# Patient Record
Sex: Male | Born: 1955 | Race: Black or African American | Hispanic: No
Health system: Midwestern US, Academic
[De-identification: ages and names within clinical notes are randomized; demographics above are authoritative.]

## PROBLEM LIST (undated history)

## (undated) ENCOUNTER — Ambulatory Visit

## (undated) DIAGNOSIS — I10 Essential (primary) hypertension: Secondary | ICD-10-CM

## (undated) DIAGNOSIS — J449 Chronic obstructive pulmonary disease, unspecified: Secondary | ICD-10-CM

## (undated) DIAGNOSIS — R221 Localized swelling, mass and lump, neck: Secondary | ICD-10-CM

## (undated) DIAGNOSIS — R1011 Right upper quadrant pain: Secondary | ICD-10-CM

## (undated) DIAGNOSIS — I639 Cerebral infarction, unspecified: Principal | ICD-10-CM

## (undated) DIAGNOSIS — R109 Unspecified abdominal pain: Secondary | ICD-10-CM

## (undated) DIAGNOSIS — R0602 Shortness of breath: Secondary | ICD-10-CM

## (undated) DIAGNOSIS — R918 Other nonspecific abnormal finding of lung field: Secondary | ICD-10-CM

## (undated) HISTORY — PX: HERNIA REPAIR: SHX51

## (undated) HISTORY — PX: HEMORRHOID SURGERY: SHX153

## (undated) HISTORY — PX: APPENDECTOMY: SHX54

## (undated) HISTORY — PX: EYE SURGERY: SHX253

## (undated) HISTORY — PX: DG 3RD DIGIT LEFT HAND: HXRAD1645

## (undated) LAB — GLUCOSE POC NURSING
POC Glucose: 102
POC Glucose: 134
POC Glucose: 99

---

## 1998-09-11 ENCOUNTER — Other Ambulatory Visit (HOSPITAL_COMMUNITY): Admission: RE | Admit: 1998-09-11 | Discharge: 1998-10-06 | Payer: Self-pay | Admitting: Psychiatry

## 2000-08-21 ENCOUNTER — Ambulatory Visit (HOSPITAL_COMMUNITY): Admission: RE | Admit: 2000-08-21 | Discharge: 2000-08-21 | Payer: Self-pay | Admitting: *Deleted

## 2000-08-21 ENCOUNTER — Encounter: Payer: Self-pay | Admitting: *Deleted

## 2001-10-18 ENCOUNTER — Encounter: Payer: Self-pay | Admitting: Emergency Medicine

## 2001-10-18 ENCOUNTER — Emergency Department (HOSPITAL_COMMUNITY): Admission: EM | Admit: 2001-10-18 | Discharge: 2001-10-18 | Payer: Self-pay | Admitting: Emergency Medicine

## 2003-01-26 ENCOUNTER — Ambulatory Visit (HOSPITAL_COMMUNITY): Admission: RE | Admit: 2003-01-26 | Discharge: 2003-01-26 | Payer: Self-pay | Admitting: Family Medicine

## 2003-02-16 ENCOUNTER — Ambulatory Visit (HOSPITAL_COMMUNITY): Admission: RE | Admit: 2003-02-16 | Discharge: 2003-02-16 | Payer: Self-pay | Admitting: Family Medicine

## 2003-10-11 ENCOUNTER — Ambulatory Visit (HOSPITAL_COMMUNITY): Admission: RE | Admit: 2003-10-11 | Discharge: 2003-10-11 | Payer: Self-pay | Admitting: Family Medicine

## 2005-12-27 ENCOUNTER — Inpatient Hospital Stay
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
## 2006-05-30 ENCOUNTER — Ambulatory Visit (HOSPITAL_COMMUNITY): Admission: RE | Admit: 2006-05-30 | Discharge: 2006-05-30 | Payer: Self-pay | Admitting: General Surgery

## 2006-05-30 ENCOUNTER — Encounter (INDEPENDENT_AMBULATORY_CARE_PROVIDER_SITE_OTHER): Payer: Self-pay | Admitting: Specialist
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
## 2007-06-30 ENCOUNTER — Ambulatory Visit (HOSPITAL_COMMUNITY): Admission: RE | Admit: 2007-06-30 | Discharge: 2007-06-30 | Payer: Self-pay | Admitting: Family Medicine
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
## 2007-10-31 ENCOUNTER — Emergency Department (HOSPITAL_COMMUNITY): Admission: EM | Admit: 2007-10-31 | Discharge: 2007-10-31 | Payer: Self-pay | Admitting: Emergency Medicine
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
## 2007-11-19 ENCOUNTER — Ambulatory Visit (HOSPITAL_COMMUNITY): Admission: RE | Admit: 2007-11-19 | Discharge: 2007-11-19 | Payer: Self-pay | Admitting: Family Medicine
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
## 2008-08-29 ENCOUNTER — Emergency Department (HOSPITAL_COMMUNITY): Admission: EM | Admit: 2008-08-29 | Discharge: 2008-08-29 | Payer: Self-pay | Admitting: Emergency Medicine

## 2009-04-29 IMAGING — CR DG CHEST 2V
2 series · 2 of 2 positions shown · non-contrast
Comparison: Chest x-ray of 10/31/2007

CLINICAL DATA: Short of breath, cough for a month

CHEST - 2 VIEW

[view not recorded (1 of 2)]
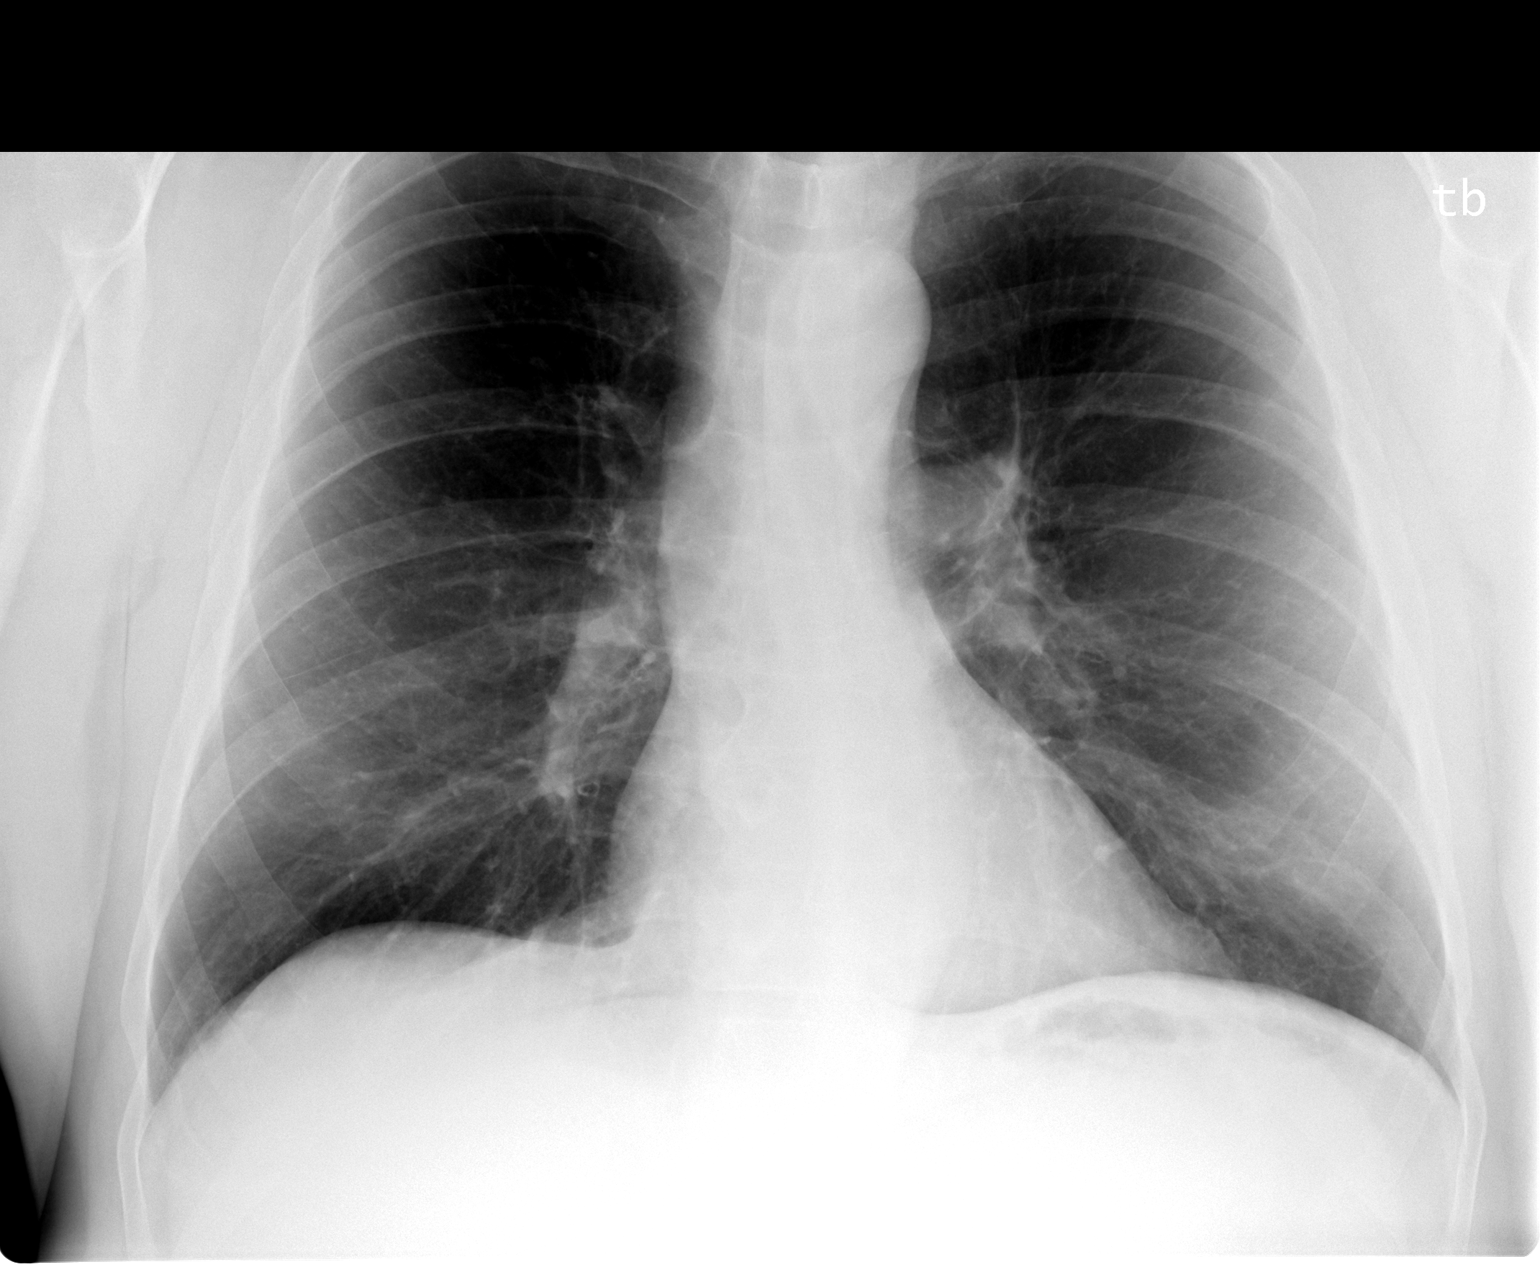

[view not recorded (2 of 2)]
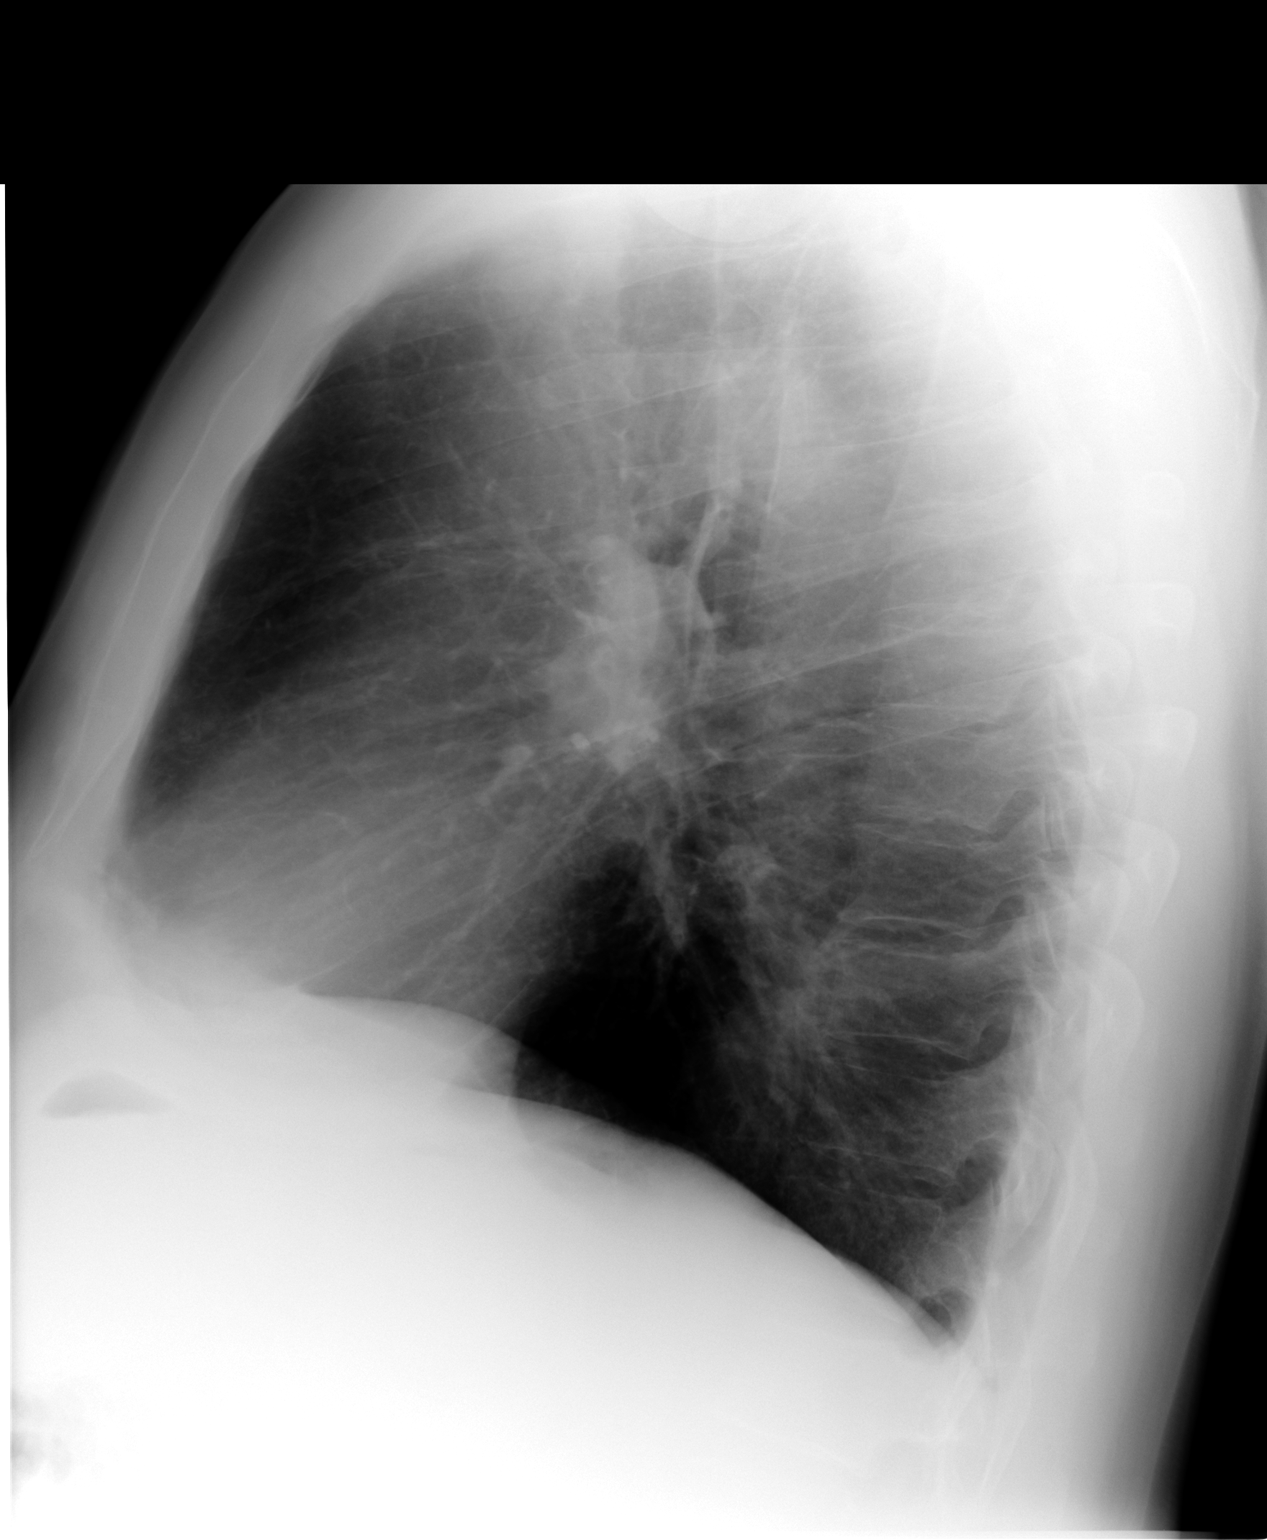

[2 of 2 positions shown; findings below may reference images not displayed]

FINDINGS: The lungs are clear but hyperaerated consistent with
COPD.  Peribronchial thickening is noted consistent with
bronchitis.  The heart is within normal limits in size.  No bony
abnormality is seen
IMPRESSION: COPD and probable chronic bronchitis.  No active lung disease.

## 2009-08-03 NOTE — Unmapped (Signed)
Signed by   LinkLogic on 08/04/2009 at 12:22:07  Patient: [name]  Note: All result statuses are Final unless otherwise noted.    Tests: (1)  (MR)    Order Note:                                         THE [facility]     PATIENT NAME:   [name], [name]                    MR #:  [id number]  DATE OF BIRTH:  25-Oct-1955                        ACCOUNT #:  [id number]  ED PHYSICIAN:   Dorris Fetch, M.D.            ROOM #:  PRIMARY:        Selected Referral Pt              NURSING UNIT:  ED  REFERRING:      Selected Referral Pt              FC:  S  DICTATED BY:    Dale Durham. Rubye Oaks, M.D.              ADMIT DATE:  08/03/2009  VISIT DATE:     08/03/2009                        DISCHARGE DATE:                               EMERGENCY DEPARTMENT NOTE     *-*-*     CHIEF COMPLAINT:  I want a referral.     HISTORY OF PRESENT ILLNESS:  This patient is a 54 year old male with chronic  neck and back pain who comes in also wanting a medication refill.  He states  he has 8/10 pain in his neck and back that is constant, worsened by standing,  relieved by lying down, nonradiating.  It is an achy pain.  It is no  different than his prior pain.  He denies any new trauma.  Denies any saddle  anesthesia, numbness, weakness, tingling in his lower extremities, or any  urinary or bowel incontinence.  He denies any upper extremity complaints as  well.  The patient also reports he feels he has an umbilical hernia that is  tender to palpation, but reducible per his report.     PAST MEDICAL HISTORY:     1.  Chronic neck and back pain.  2.  Diabetes.  3.  COPD.     ALLERGIES:  Please see nursing notes.     MEDICATIONS:  Please see nursing notes.     SOCIAL HISTORY:  The patient denies alcohol, tobacco or drug use.     REVIEW OF SYSTEMS:  Please refer to HPI for pertinent review of systems.  All  other systems are reviewed and are negative including no fevers, chills,  chest pain, shortness of breath.     PHYSICAL EXAMINATION:        VITAL SIGNS:  Blood pressure 143/99, pulse 83, respirations 16, temperature  98.5, SaO2 98% on room air.  GENERAL:  The patient is resting comfortably in no obvious  distress.  HEENT:  Atraumatic, normocephalic.  Pupils equal, round, reactive to light  and accommodation.  Extraocular movements are intact.  NECK:  Supple, full range of motion, no JVD or cervical lymphadenopathy.  CHEST:  Clear to auscultation bilaterally, no wheezing, rales or rhonchi.  CARDIOVASCULAR:  Regular rate and rhythm, normal S1, S2, no murmurs, rubs or  gallops on exam.  ABDOMEN:  Soft, it is nontender throughout.  He does have an umbilical hernia  that is very easily reducible that is mildly tender to palpation, but shows  no signs of any incarceration or strangulation.  Remainder of the abdominal  exam was within normal limits with no signs of organomegaly.  GENITOURINARY:  Deferred per patient request and not related to a primary  complaint.  MUSCULOSKELETAL:  The patient has no C-spine, T-spine or L-spine midline  tenderness.  He does have paraspinal neck tenderness and paraspinal low back  tenderness that is chronic.  He has a negative straight leg raise test  bilaterally.  NEUROLOGIC:  The patient is awake, alert and oriented x4 with a nonfocal  neurologic exam.  Gait was assessed and was normal with no signs of any  ataxia.  SKIN:  No obvious petechiae, rash or purpura.     EMERGENCY DEPARTMENT COURSE/MEDICAL DECISION MAKING:  The patient is a  54 year old male who comes in with chronic neck and back pain as well as a  possible umbilical hernia who was seen by myself and my attending, Dr.  Oneal Grout, who agrees with the assessment and plan.  Upon appropriate history  and physical in minor care room 16, this patient was given 50 mg of tramadol  by mouth.  At this time, with regards to the patient's neck and back pain, it  is chronic in nature.  It is unchanged.  He has no new trauma.  I am not  concerned for any significant neurologic  injury including cord injury and/or  cauda equina syndrome as he has no history or physical findings to support  this.  At this time, the patient was referred to a pain management center  which was an outpatient referral form that I faxed in and gave his best  contact information.  He was told he could follow up with them as they called  to schedule.  He was given a prescription for 50 mg Tramadol, take one to two  by mouth every four hours as needed for pain, #30 for relief.  With regards  to the patient's umbilical hernia I do believe he does have an umbilical  hernia.  It is easily reducible.  No signs of any strangulation or  incarceration.  His abdominal exam is completely benign.  At this time, I am  not concerned that this patient warrants any further imaging or any other  concerning studies.  Given this, I did refer the patient to the general  surgery clinic for further outpatient management for this issue.  At this  time, I believe the patient is stable for discharge home with no significant  life threatening pathology, and he will be discharged home in stable  condition.     DIAGNOSIS:     1.  Umbilical hernia, reducible and reduced.  2.  Chronic neck and back pain, unchanged.     PLAN:  Follow up with pain and general surgery clinic as above as well as  take Tramadol.  I gave my typical abdominal pain and back pain return  precautions.        *-*-*  _______________________________________  JPP/bcy                                _____  D:  08/03/2009 20:03                  Dale Durham. Rubye Oaks, M.D.  T:  08/04/2009 11:45  Job #:  [id number]                                        _______________________________________                                         _____                                        Dorris Fetch, M.D.                                   EMERGENCY DEPARTMENT NOTE                                                               PAGE    1 of   1    Note: An  exclamation mark (!) indicates a result that was not dispersed into   the flowsheet.  Document Creation Date: 08/04/2009 12:22 PM  _______________________________________________________________________    (1) Order result status: Final  Collection or observation date-time: 08/03/2009 00:00  Requested date-time:   Receipt date-time:   Reported date-time:   Referring Physician: Selected Pt  Ordering Physician:  Reviewed In Hospital [facility])  Specimen Source:   Source: DBS  Filler Order Number: [id number]  Lab site:

## 2009-08-04 NOTE — Unmapped (Signed)
THE [facility]     PATIENT NAME:   [name], [name]                    MR #:  [id number]   DATE OF BIRTH:  [birth year]                        ACCOUNT #:  [id number]   ED PHYSICIAN:   Dorris Fetch, M.D.            ROOM #:   PRIMARY:        Selected Referral Pt              NURSING UNIT:  ED   REFERRING:      Selected Referral Pt              FC:  S   DICTATED BY:    Dale Durham. Rubye Oaks, M.D.              ADMIT DATE:  08/03/2009   VISIT DATE:     08/03/2009                        DISCHARGE DATE:                               EMERGENCY DEPARTMENT NOTE     *-*-*     CHIEF COMPLAINT:  I want a referral.     HISTORY OF PRESENT ILLNESS:  This patient is a 54 year old male with chronic   neck and back pain who comes in also wanting a medication refill.  He states   he has 8/10 pain in his neck and back that is constant, worsened by standing,   relieved by lying down, nonradiating.  It is an achy pain.  It is no   different than his prior pain.  He denies any new trauma.  Denies any saddle   anesthesia, numbness, weakness, tingling in his lower extremities, or any   urinary or bowel incontinence.  He denies any upper extremity complaints as   well.  The patient also reports he feels he has an umbilical hernia that is   tender to palpation, but reducible per his report.     PAST MEDICAL HISTORY:     1.  Chronic neck and back pain.   2.  Diabetes.   3.  COPD.     ALLERGIES:  Please see nursing notes.     MEDICATIONS:  Please see nursing notes.     SOCIAL HISTORY:  The patient denies alcohol, tobacco or drug use.     REVIEW OF SYSTEMS:  Please refer to HPI for pertinent review of systems.  All   other systems are reviewed and are negative including no fevers, chills,   chest pain, shortness of breath.     PHYSICAL EXAMINATION:     VITAL SIGNS:  Blood pressure 143/99, pulse 83, respirations 16, temperature   98.5, SaO2 98% on room air.   GENERAL:  The patient is resting comfortably in no obvious  distress.   HEENT:  Atraumatic, normocephalic.  Pupils equal, round, reactive to light   and accommodation.  Extraocular movements are intact.   NECK:  Supple, full range of motion, no JVD or cervical lymphadenopathy.   CHEST:  Clear to auscultation bilaterally, no wheezing, rales or rhonchi.   CARDIOVASCULAR:  Regular rate and  rhythm, normal S1, S2, no murmurs, rubs or   gallops on exam.   ABDOMEN:  Soft, it is nontender throughout.  He does have an umbilical hernia   that is very easily reducible that is mildly tender to palpation, but shows   no signs of any incarceration or strangulation.  Remainder of the abdominal   exam was within normal limits with no signs of organomegaly.   GENITOURINARY:  Deferred per patient request and not related to a primary   complaint.   MUSCULOSKELETAL:  The patient has no C-spine, T-spine or L-spine midline   tenderness.  He does have paraspinal neck tenderness and paraspinal low back   tenderness that is chronic.  He has a negative straight leg raise test   bilaterally.   NEUROLOGIC:  The patient is awake, alert and oriented x4 with a nonfocal   neurologic exam.  Gait was assessed and was normal with no signs of any   ataxia.   SKIN:  No obvious petechiae, rash or purpura.     EMERGENCY DEPARTMENT COURSE/MEDICAL DECISION MAKING:  The patient is a   54 year old male who comes in with chronic neck and back pain as well as a   possible umbilical hernia who was seen by myself and my attending, Dr.   Oneal Grout, who agrees with the assessment and plan.  Upon appropriate history   and physical in minor care room 16, this patient was given 50 mg of tramadol   by mouth.  At this time, with regards to the patient's neck and back pain, it   is chronic in nature.  It is unchanged.  He has no new trauma.  I am not   concerned for any significant neurologic injury including cord injury and/or   cauda equina syndrome as he has no history or physical findings to support   this.  At this time, the  patient was referred to a pain management center   which was an outpatient referral form that I faxed in and gave his best   contact information.  He was told he could follow up with them as they called   to schedule.  He was given a prescription for 50 mg Tramadol, take one to two   by mouth every four hours as needed for pain, #30 for relief.  With regards   to the patient's umbilical hernia I do believe he does have an umbilical   hernia.  It is easily reducible.  No signs of any strangulation or   incarceration.  His abdominal exam is completely benign.  At this time, I am   not concerned that this patient warrants any further imaging or any other   concerning studies.  Given this, I did refer the patient to the general   surgery clinic for further outpatient management for this issue.  At this   time, I believe the patient is stable for discharge home with no significant   life threatening pathology, and he will be discharged home in stable   condition.     DIAGNOSIS:     1.  Umbilical hernia, reducible and reduced.   2.  Chronic neck and back pain, unchanged.     PLAN:  Follow up with pain and general surgery clinic as above as well as   take Tramadol.  I gave my typical abdominal pain and back pain return   precautions.       *-*-*  _______________________________________   JPP/bcy                                _____   D:  08/03/2009 20:03                  Dale Durham. Rubye Oaks, M.D.   T:  08/04/2009 11:45   Job #:  [id number]                                         _______________________________________                                          _____                                         Dorris Fetch, M.D.                                  EMERGENCY DEPARTMENT NOTE                                                                PAGE    1 of   1                                          _____                                         Dorris Fetch, M.D.                                   EMERGENCY DEPARTMENT NOTE                                                                PAGE    1 of   1

## 2009-08-24 NOTE — Unmapped (Signed)
Signed by Nicholaus Corolla MA on 08/24/2009 at 12:43:57      Preload Clinical Lists   Problems:   UMBILICAL HERNIA, HX OF (ICD-V13.8)    Medications:   TRAMADOL HCL 50 MG TABS (TRAMADOL HCL) one to two tablets by mouth as needed for pain      Allergies:  Allergies have not been documented for this patient          ALLERGIES  Allergies have not been documented for this patient

## 2009-08-30 ENCOUNTER — Inpatient Hospital Stay

## 2009-08-30 NOTE — Unmapped (Signed)
Signed by Thomasene Mohair CSC on 08/30/2009 at 16:37:22    U[facility] Surgery Scheduling Form    Surgery  / Procedure Schedule Sheet   Requested Date: 09/05/2009  Requested Time: 1500  Length of Surgery:  Surgeon: Armond Hang MD  Resident: Yolanda Bonine MD  Surgery Team: Clyda Hurdle  Facility: [facility]  Type of patient: Service  Will patient go to: PACU  Is patient: Out Pt.  Anesthesia Type: IV Sedation/RN    Allergies:   Allergies have not been documented for this patient      Procedure   Procedure: Screening colonoscopy  Diagnoses: Colonoscopy      Patient Information   Name: [name]  DOB: December 13, 1955  SSN: [id number]  Address: [address]  [place], [place]  [address]  Gender: Male  Home phone: [phone]  IDX #: [id number]  Last Word #: [id number]    Insurance Information   Primary Insurance: BAD ADDRESS  Primary Insurance: Eartha Inch 09/27/09

## 2009-08-30 NOTE — Unmapped (Signed)
Signed by Jessika Rothery MD on 09/27/2009 at 11:35:34    Printed Handout:  - [employer]

## 2009-08-30 NOTE — Unmapped (Signed)
Signed by Ernesta Amble MD on 11/01/2009 at 12:34:56        [facility] Physician's Checklist/Order Sheet:   Name: [name] #: [id number]  Age: 54 Years Old Old  DOB: 1955/11/06  Gender: Male  Home phone: [phone]  Surgeon/Team: Armond Hang MD  Yolanda Bonine MD  Diagnosis: Small caliber stools  Type of Surgery: Screening colonoscopy    Admit Status: SDS  Needs Language Interpreter: No    Allergies:     Latex Allergy: No    Preoperative Antibiotic Prophylaxis Orders:   GENERAL SURGERY/ ENDOCRINE:   Routine Pre-op Antiobiotic   Cefazolin    Physician: Yolanda Bonine MD

## 2009-08-30 NOTE — Unmapped (Signed)
Signed by Megan Mans MD on 09/22/2009 at 12:27:49            THE [facility]  GENERAL SURGERY [facility]  OFFICE VISIT      DEMOGRAPHIC UPDATE   Current registration information has been reviewed and is correct.  No changes are needed.    ALLERGIES  ! * PENICILLIN DRUGS  Allergy and adverse reaction list reviewed during this update.      MEDICATIONS  TRAMADOL HCL 50 MG TABS (TRAMADOL HCL) one to two tablets by mouth as needed for pain        VITAL SIGNS  Height: 72 in.   Weight: 185 lbs.    Temperature: 97.1  degrees F  oral  Pulse rate: 87     Blood Pressure   BP #1: 133 / 94mm Hg  Cuff Size: Std   PAIN  Have you had pain other than everyday aches and pains (e.g., mild headache, back ache, strains) in the past week?   Yes  Location of pain: back and neck  Pain Scale: 8 out of 10    Intake recorded by: Darnell Level RN on August 30, 2009 3:02 PM     Communication Sensory  Primary Language English     Functional Screen/Fall Risk Assessment  In the past two months, patient has experienced:  1.  A decreased ability to walk, turn in bed, get in/out of a chair? Yes  2.  Decreased ability to care for self, perform routine tasks? Yes  3.  Recent problem with coordination/movement or loss of balance? Yes  4.  Use of ambulation device such as walker/cane or crutches? No  5.  Weakness, dizziness, shortness of breath, fatigue with activity? Yes  6.  Recent frequent history of falling? No  7.  Do you exercise? No   Nutritional Screen  1.  Do you eat a special diet or meal plan? No2.  Have you had a recent weight gain or loss of 10 lbs (in the past 2 months?) Yes  3.  Do you have a difficulty eating, chewing, swallowing or speaking? Yes  4.  When was the last time you were seen by the Dentist?  09/02/2007     Emotional Psychosocial Spiritual  1.  Do you have any spiritual, religious or cultural rituals that we need to be aware of? Yes       What? Protestant  2.  Patient feels:  Trouble Sleeping  3.  Do you  have current, recent thoughts that you would be better off dead or of hurting yourself? No  4.  Do you have a mental health provider, case manager or payee? No  5.  Patient currently receives:  None.  6.  Do you have adequate resources/medications? Yes     Other-grandchild two years ago  8.  Household: With family      Abuse/Neglect  Due to the increase in domestic violence, we ask all patients:  1.  Have you recently been threatened, frightened, mistreated, hurt or hit by anyone in your life? No  2.  Have you had money or other items taken from you without your permission? No     Wellness  Have you had the following immunizations/vaccinations? (check all that apply)    Tetanus or TDAP (within last 10 yrs?)   Educational Knowledge  Learning Preference: Listening, Reading, Seeing, Doing  Questionnaire completed: 08/30/2009    Signed by:  Darnell Level RN  on August 30, 2009 3:06 PM    Medications reviewed, updated and verified with patient or patient representative.    Screening for unhealthy alcohol use performed.  Men  (Age 31 or Under): nondrinker      HISTORY OF PRESENT ILLNESS  Chief Complaint: Here for evaluation of umbilical hernia.  States referred by ER.  Pt states has HTN and Diabetes Type 2 and takes pills for these.  Pt states he is currently out of all his medications.   Dr. Joseph Art in [facility] had been writing these meds in [place], [place].  Hx of herniated disc and emphysema he states.  He state ER referred him to pain control center but he does not have a PCP.  Working on getting medical card and disability.  History from: patient  Additional Notes: Patient is a 54 yo AAM with a PMH significant for DM2 and emphysema presents for evaluation of an umbilical hernia. He has been bothered by this hernia for about 2 years. He denies bloody stools, nausea or vomiting. He reports straining while stooling and decreased caliber of stools for the last 2 months. He also reports unintentional 10 lb weight loss in the  last 2 months. The patient denies chest pain, shortnees of breath.    PMH: DM, enlarged prostate, chronic back pain, emphysema  Allergies: penicillin (gets hives)  Meds:  SocH: Smokes 1/2 ppd of cigarettes; Unable to work now due to back pain; out of medications at this time;    PAST HISTORY  Social History: Alcohol Use: none  Drug Use: none  Tobacco Usage:smoker  Cigarettes-Year Started: 1969,   Cigarettes-Years smoked- 42,   Cigarettes-Packs per Day- .5,   Pack/Years- 21        Physical Exam   General Appearance: No acute distress    Respiratory   Respiratory Effort: breathing comfortably, no increased work of breathing  Auscultation: Some decreased air movement bilaterally    Cardiovascular   Auscultation: Regular rate and rhythm    Gastrointestinal   Abdomen: Soft, ND, mildly tender to palpation in the LLQ. Small, reducible hernia palpated within the margin of the umbilicus              PATIENT INSTRUCTIONS  1) Colonoscopy scheduled for September 05, 2009  2) Clear liquids only the 24hrs before the procedure  3) Must drink 4 liters of Go Lightly bowel prep regimen the day before procedure - you will have to go to the bathroom frequently, so stay close to a toilet  4) Follow-up with Surgery clinic 1 week after the procedure    SERVICE ORDERS   [id number] - Patient Encounter Documented Using an EHR Certified by [employer] [*CPT-G8447]  [phone] - Current Medications with Name, Dosages, Frequency and Route Documented [*CPT-G8427]  3016F - Unhealthy Alcohol Use Screening Performed [*CPT-3016F]  Consultation/Referral Single (1 point)  [POINTS]  Education Multiple Topics (2 points) [POINTS]    DISPOSITION  Return to clinic for Doctor Visit week(s)     *Patient Care Summary printed and given to patient.    Nursing Notes: Referral for colonoscopy.  Pt will be called with date and time of procedure.  Detailed instructions reviewed with patient on use of rx-prep= Golytely, diet prep the day before, and importance of transportation by  trusted individual due to IV sedation.  Pt indicates he understand plan.  Written instructions also provided for reference.    Signed by:  Darnell Level RN on August 30, 2009 4:27 PM  PRECEPTOR ACKNOWLEDGEMENTS  I discussed with resident and agree with resident's findings and plan as documented in resident's note.    A&P Colonoscopy needed                ]

## 2009-09-05 ENCOUNTER — Inpatient Hospital Stay

## 2009-09-05 NOTE — Unmapped (Signed)
Signed by   [employer] on 09/07/2009 at 00:42:19  Patient: [name]  Note: All result statuses are Final unless otherwise noted.    Tests: (1)  (MR)    Order Note:                                         THE [facility]     PATIENT NAME:   AMER, ALCINDOR                 MR #:  [id number]  DATE OF BIRTH:  12-24-1955                        ACCOUNT #:  [id number]  PHYSICIAN:      Ernesta Amble., M.D.          ROOM #:  END  SERVICE:        General Surgery                   NURSING UNIT:  UEND  PRIMARY:        No Pcp No Pcp                     FC:  S  REFERRING:      Ernesta Amble., M.D.          ADMIT DATE:  09/05/2009  DICTATED BY:    Bevelyn Ngo, M.D.                PROCEDURE DATE:  09/05/2009                                                    DISCHARGE DATE:                                    ENDOSCOPY REPORT     *-*-*     ATTENDING SURGEON:  Armond Hang, M.D.     ASSISTANT SURGEON:  Bevelyn Ngo, M.D.     PREOPERATIVE DIAGNOSIS:     1. He is greater than 50, needs for screening colonoscopy.     POSTOPERATIVE DIAGNOSIS:     1. Normal colon.     PROCEDURE PERFORMED:     1. Colonoscopy proximal to the splenic flexure.     ANESTHESIA:  Versed and fentanyl.     ESTIMATED BLOOD LOSS:  None.     IMPLANTS:  None.     CULTURES:  None.     SPECIMENS:  None.     FINDINGS:  Colon visualized from the anus to the ileocecal valve.  The colon  appeared normal without any evidence of polyps, masses, or other pathology.     DESCRIPTION OF PROCEDURE:  The patient was brought back to the endoscopy  suite and placed upon the endoscopy table.  The patient was placed in the  left lateral decubitus position.  A time out was performed verifying the  correct patient, procedure and site.  After receiving some Versed and  fentanyl for sedation, a rectal exam was performed, which was normal.  The  endoscope was then passed through the anus and  advanced carefully all the way  to the cecum.  The ileocecal valve was  visualized.  The endoscope was then  slowly withdrawn, visualizing all walls of the colon.  There were a few small  areas of stool that was irrigated and suctioned.  There was no evidence of  any pathology at any point throughout the colon.  This was a normal  colonoscopy.  The endoscope was then retroflexed and no evidence of  hemorrhoids were noted.  Endoscope was then removed and the patient was sent  to the recovery area in stable condition.  Dr. Armond Hang was present for  the entire duration of the colonoscopy.        *-*-*                                              _______________________________________  EC/dm                                  _____  D:  09/06/2009 14:44                  Ernesta Amble., M.D.  T:  09/07/2009 00:39                  Dictated by:  Bevelyn Ngo, M.D.  Job #:  [id number]                                           ENDOSCOPY REPORT                                                               PAGE    1 of   1    Note: An exclamation mark (!) indicates a result that was not dispersed into   the flowsheet.  Document Creation Date: 09/07/2009 12:42 AM  _______________________________________________________________________    (1) Order result status: Final  Collection or observation date-time: 09/05/2009 00:00  Requested date-time:   Receipt date-time:   Reported date-time:   Referring Physician: Armond Hang  Ordering Physician:  Reviewed In Hospital [facility])  Specimen Source:   Source: DBS  Filler Order Number: [id number] ASC  Lab site:

## 2009-09-06 NOTE — Unmapped (Signed)
Signed by Thomasene Mohair CSC on 09/06/2009 at 11:53:19    UCP Surgery Scheduling Form    Surgery  / Procedure Schedule Sheet   Requested Date: 09/21/2009  Requested Time: 12:30  Length of Surgery: 1hr  Surgeon: Armond Hang MD  Resident: Yolanda Bonine MD  Surgery Team: [place]  Facility: [facility]  Type of patient: Service  Will patient go to: PACU  Is patient: Out Pt.  Anesthesia Type: General    Allergies:   Allergies have not been documented for this patient      Procedure   Procedure: Umbilical hernia repair  Diagnoses: UMBILICAL HERNIA HX OF (ICD-V13.8)      Patient Information   Name: [name]  DOB: May 11, 1955  SSN: [id number]  Address: [address]  [place], [place]  [address]  Gender: Male  Home phone: [phone]  IDX #: [id number]  Last Word #: [id number]    Insurance Information   Primary Insurance: BAD ADDRESS  Primary Insurance: Eartha Inch 09/27/09

## 2009-09-07 NOTE — Unmapped (Signed)
THE [facility]     PATIENT NAME:   [name], [name]                 MR #:  [id number]   DATE OF BIRTH:  1955-06-29                        ACCOUNT #:  [id number]   PHYSICIAN:      Ernesta Amble., M.D.          ROOM #:  END   SERVICE:        General Surgery                   NURSING UNIT:  UEND   PRIMARY:        No Pcp No Pcp                     FC:  S   REFERRING:      Ernesta Amble., M.D.          ADMIT DATE:  09/05/2009   DICTATED BY:    Bevelyn Ngo, M.D.                PROCEDURE DATE:  09/05/2009                                                     DISCHARGE DATE:                                    ENDOSCOPY REPORT     *-*-*     ATTENDING SURGEON:  Armond Hang, M.D.     ASSISTANT SURGEON:  Bevelyn Ngo, M.D.     PREOPERATIVE DIAGNOSIS:     1. He is greater than 50, needs for screening colonoscopy.     POSTOPERATIVE DIAGNOSIS:     1. Normal colon.     PROCEDURE PERFORMED:     1. Colonoscopy proximal to the splenic flexure.     ANESTHESIA:  Versed and fentanyl.     ESTIMATED BLOOD LOSS:  None.     IMPLANTS:  None.     CULTURES:  None.     SPECIMENS:  None.     FINDINGS:  Colon visualized from the anus to the ileocecal valve.  The colon   appeared normal without any evidence of polyps, masses, or other pathology.     DESCRIPTION OF PROCEDURE:  The patient was brought back to the endoscopy   suite and placed upon the endoscopy table.  The patient was placed in the   left lateral decubitus position.  A time out was performed verifying the   correct patient, procedure and site.  After receiving some Versed and   fentanyl for sedation, a rectal exam was performed, which was normal.  The   endoscope was then passed through the anus and advanced carefully all the way   to the cecum.  The ileocecal valve was visualized.  The endoscope was then   slowly withdrawn, visualizing all walls of the colon.  There were a few small   areas of stool that was irrigated and suctioned.  There  was no evidence of   any pathology at any point throughout  the colon.  This was a normal   colonoscopy.  The endoscope was then retroflexed and no evidence of   hemorrhoids were noted.  Endoscope was then removed and the patient was sent   to the recovery area in stable condition.  Dr. Armond Hang was present for   the entire duration of the colonoscopy.       *-*-*                                             _______________________________________   EC/dm                                  _____   D:  09/06/2009 14:44                  Ernesta Amble., M.D.   T:  09/07/2009 00:39                  Dictated by:  Bevelyn Ngo, M.D.   Job #:  [id number]                                         ENDOSCOPY REPORT                                                                PAGE    1 of   1   D:  09/06/2009 14:44                  Ernesta Amble., M.D.   T:  09/07/2009 00:39                  Dictated by:  Bevelyn Ngo, M.D.   Job #:  [id number]                                         ENDOSCOPY REPORT                                                                PAGE    1 of   1

## 2009-09-07 NOTE — Unmapped (Signed)
Signed by Ernesta Amble MD on 09/28/2009 at 14:26:23        [facility] Physician's Checklist/Order Sheet:   Name: FIDEL CAGGIANO #: [id number]  Age: 54 Years Old Old  DOB: Mar 23, 1955  Gender: Male  Home phone: [phone]  Surgeon/Team: Armond Hang MD/Michael Derrill Kay MD  Diagnosis: UMBILICAL HERNIA HX OF (ICD-V13.8)  Type of Surgery: Umbilical hernia repair    Admit Status: SDS  Intended Postoperative Disposition: Discharge home  Needs Language Interpreter: No    Level of CPC Visit:   A.  Phone Screen (no CPC visit) - H&P done by:  Special Prep:     Hibiclens   Day of Surgery Orders:     DVT-Prophylaxis:   Sequential Compression Device  Heparin 5000 units Subcutaneous    Preoperative Antibiotic Prophylaxis Orders:   GENERAL SURGERY/ ENDOCRINE:   Penicillin or Cephalosporin Allergy   Clindamycin    Physician: Ernesta Amble MD

## 2009-09-13 ENCOUNTER — Inpatient Hospital Stay

## 2009-09-21 ENCOUNTER — Inpatient Hospital Stay

## 2009-09-21 NOTE — Unmapped (Signed)
Signed by   LinkLog[employer] on 09/22/2009 at 06:26:34  Patient: [name]  Note: All result statuses are Final unless otherwise noted.    Tests: (1)  (MR)    Order Note:                                         THE [facility]     PATIENT NAME:   [name], [name]                 MR #:  [id number]  DATE OF BIRTH:  Feb 20, 1955                        ACCOUNT #:  [id number]  SURGEON:        Ernesta Amble., M.D.          ROOM #:  SDS  SERVICE:        General Surgery                   NURSING UNIT:  USDS  PRIMARY:        No Pcp No Pcp                     FC:  S  REFERRING:      Ernesta Amble., M.D.          ADMIT DATE:  09/21/2009  DICTATED BY:    Bevelyn Ngo, M.D.                SURGERY DATE:  09/21/2009                                                    DISCHARGE DATE:                                    OPERATIVE REPORT     *-*-*     SURGEON:  Armond Hang M.D.     ASSISTANT:  Bevelyn Ngo, M.D.; Rolm Bookbinder. Jen Mow, M.D.     PREOPERATIVE DIAGNOSIS:     1.   Umbilical hernia.     POSTOPERATIVE DIAGNOSIS:     1.   Umbilical hernia.     PROCEDURES PERFORMED:     1.   Umbilical hernia repair, primary closure.     ANESTHESIA: General.     ESTIMATED BLOOD LOSS: 5 cc.     COMPLICATIONS:  None.     IMPLANTED DEVICES:  None.     DRAINS:  None.     TUBES: None.     INDICATIONS FOR PROCEDURE:  The patient presented to the surgery clinic  complaining of a symptomatic umbilical hernia.  This was palpated and found  to be small on examination.  After undergoing a colonoscopy the patient was  scheduled and booked for his umbilical hernia repair.  Risks and benefits of  the procedure including risk of damage to the bowels and recurrence of hernia  were described to the patient and he agreed to proceed.     DESCRIPTION OF PROCEDURE:  The patient was identified in the preoperative  area and the correct procedure was  verified with him.  The patient was  brought back to the operating room and laid supine upon the operating  room  table.  General anesthesia was induced without complication.  The abdomen was  prepped and draped in the usual sterile fashion.  A timeout was performed,  verifying the correct patient, procedure and site.  The patient did receive  preoperative antibiotics.  After the timeout was verified with all members of  the OR staff present, an infraumbilical incision was then made.  This was  carried down to the level of the fascia with Bovie electrocautery.  A Kelly  clamp was then used to dissect around the umbilical stalk.  After dissecting  circumferentially around the umbilical stalk, electrocautery was used to  divide the attachment to the umbilical stalk from the underlying fascia and  hernia.  Once the umbilical stalk was freed, approximately a 1 cm in diameter  hernia defect was noted.  This hernia contained a small amount of fat, no  evidence of any incarcerated bowel or other abdominal contents.  The fascia  was then cleared on its anterior surface of fat and other subcutaneous tissue  to ensure that we would have clean fascia to sew with.  Next, the  infra-abdominal portion of the hernia defect was explored and any adhesions  from abdominal contents to the hernia were freed up.  After ensuring that the  fascia underneath the area of the defect was clear of any adhesive tissue for  at least 2 cm, the hernia was repaired with 0 Ethibond interrupted sutures.  Four of these sutures were placed and this closed the hernia defect well.  The umbilical stalk was then tacked back down to the abdominal fascia with a  Vicryl suture.  Marcaine 0.5% was then placed into the fascia and  subcutaneous tissues and skin for postoperative analgesia.  The skin was then  closed with 3-0 interrupted deep dermal sutures and a running 4-0 Monocryl  suture.  Steri-Strips were then applied to the wound and a pressure dressing  was then placed over the wound.  The patient tolerated the procedure well and  will be discharged home in  stable condition.        *-*-*                                              _______________________________________  [name]                                 _____  D:  09/21/2009 13:51                  Ernesta Amble., M.D.  T:  09/22/2009 06:15                  Dictated by:  Bevelyn Ngo, M.D.  Job #:  [id number]     c:   Anesthesia                                     OPERATIVE REPORT  PAGE    1 of   1    Note: An exclamation mark (!) indicates a result that was not dispersed into   the flowsheet.  Document Creation Date: 09/22/2009 6:26 AM  _______________________________________________________________________    (1) Order result status: Final  Collection or observation date-time: 09/21/2009 00:00  Requested date-time:   Receipt date-time:   Reported date-time:   Referring Physician: Armond Hang  Ordering Physician:  Reviewed In Hospital [facility])  Specimen Source:   Source: DBS  Filler Order Number: [id number]  Lab site:

## 2009-09-22 NOTE — Unmapped (Signed)
THE [facility]     PATIENT NAME:   [name], [name]                 MR #:  [id number]   DATE OF BIRTH:  October 23, 1955                        ACCOUNT #:  [id number]   SURGEON:        Ernesta Amble., M.D.          ROOM #:  SDS   SERVICE:        General Surgery                   NURSING UNIT:  USDS   PRIMARY:        No Pcp No Pcp                     FC:  S   REFERRING:      Ernesta Amble., M.D.          ADMIT DATE:  09/21/2009   DICTATED BY:    Bevelyn Ngo, M.D.                SURGERY DATE:  09/21/2009                                                     DISCHARGE DATE:                                    OPERATIVE REPORT     *-*-*     SURGEON:  Armond Hang M.D.     ASSISTANT:  Bevelyn Ngo, M.D.; Rolm Bookbinder. Jen Mow, M.D.     PREOPERATIVE DIAGNOSIS:     1.   Umbilical hernia.     POSTOPERATIVE DIAGNOSIS:     1.   Umbilical hernia.     PROCEDURES PERFORMED:     1.   Umbilical hernia repair, primary closure.     ANESTHESIA: General.     ESTIMATED BLOOD LOSS: 5 cc.     COMPLICATIONS:  None.     IMPLANTED DEVICES:  None.     DRAINS:  None.     TUBES: None.     INDICATIONS FOR PROCEDURE:  The patient presented to the surgery clinic   complaining of a symptomatic umbilical hernia.  This was palpated and found   to be small on examination.  After undergoing a colonoscopy the patient was   scheduled and booked for his umbilical hernia repair.  Risks and benefits of   the procedure including risk of damage to the bowels and recurrence of hernia   were described to the patient and he agreed to proceed.     DESCRIPTION OF PROCEDURE:  The patient was identified in the preoperative   area and the correct procedure was verified with him.  The patient was   brought back to the operating room and laid supine upon the operating room   table.  General anesthesia was induced without complication.  The abdomen was   prepped and draped in the usual sterile fashion.  A timeout was performed,   verifying the correct patient, procedure and site.  The  patient did receive   preoperative antibiotics.  After the timeout was verified with all members of   the OR staff present, an infraumbilical incision was then made.  This was   carried down to the level of the fascia with Bovie electrocautery.  A Kelly   clamp was then used to dissect around the umbilical stalk.  After dissecting   circumferentially around the umbilical stalk, electrocautery was used to   divide the attachment to the umbilical stalk from the underlying fascia and   hernia.  Once the umbilical stalk was freed, approximately a 1 cm in diameter   hernia defect was noted.  This hernia contained a small amount of fat, no   evidence of any incarcerated bowel or other abdominal contents.  The fascia   was then cleared on its anterior surface of fat and other subcutaneous tissue   to ensure that we would have clean fascia to sew with.  Next, the   infra-abdominal portion of the hernia defect was explored and any adhesions   from abdominal contents to the hernia were freed up.  After ensuring that the   fascia underneath the area of the defect was clear of any adhesive tissue for   at least 2 cm, the hernia was repaired with 0 Ethibond interrupted sutures.   Four of these sutures were placed and this closed the hernia defect well.   The umbilical stalk was then tacked back down to the abdominal fascia with a   Vicryl suture.  Marcaine 0.5% was then placed into the fascia and   subcutaneous tissues and skin for postoperative analgesia.  The skin was then   closed with 3-0 interrupted deep dermal sutures and a running 4-0 Monocryl   suture.  Steri-Strips were then applied to the wound and a pressure dressing   was then placed over the wound.  The patient tolerated the procedure well and   will be discharged home in stable condition.       *-*-*                                             _______________________________________   Bobbye Morton                                  _____   D:  09/21/2009 13:51                  Ernesta Amble., M.D.   T:  09/22/2009 06:15                  Dictated by:  Bevelyn Ngo, M.D.   Job #:  [id number]     c:   Anesthesia                                     OPERATIVE REPORT                                                                PAGE  1 of   1     c:   Anesthesia                                     OPERATIVE REPORT                                                                PAGE    1 of   1

## 2009-10-18 ENCOUNTER — Inpatient Hospital Stay

## 2009-10-18 NOTE — Unmapped (Signed)
Signed by Cyndia Bent MD on 12/01/2009 at 15:38:36            [facility]  GENERAL SURGERY [facility]  OFFICE VISIT      ALLERGIES  ! * PENICILLIN DRUGS  Allergy and adverse reaction list reviewed during this update.      VITAL SIGNS  Height: 72 in.   Weight: 184.5 lbs.    Temperature: 97.5  degrees F  oral  Pulse rate: 108     Blood Pressure   BP #1: 147 / 97mm Hg   PAIN  Have you had pain other than everyday aches and pains (e.g., mild headache, back ache, strains) in the past week?   Yes  Location of pain: neck,back  Pain Scale: 7 out of 10  If so, did you take medication for this pain?   No    Intake recorded by: Lennette Bihari RN on October 18, 2009 9:31 AM             Medications reviewed, updated and verified with patient or patient representative.  Previous Screening:   Screening for unhealthy alcohol use performed. (08/30/2009 2:54:26 PM)  Men  (Age 10 or Under): nondrinker      HISTORY OF PRESENT ILLNESS  Chief Complaint: post-op hernia repair  History from: patient    54 year old male status post op Umbilical hernia Repair on 09/21/09. Still has pain around umbilical hernia repair site, but has been improving gradually each week. Incision has healed and has had no systemic signs of infection or any complaints other than pain to the area.    Signs and Symptoms:   Patient complains of: abdominal pain          Physical Exam   General Appearance: No acute distress    Eyes   External: anicteric sclera, conjunctiva not injected, lids no lesions or swelling    Ear, Nose &Throat   External Ears: no lesions or deformities, no tenderness  External Nose: no lesions or deformities  Hearing: grossly intact    Neck   Neck Exam: supple, no masses, trachea midline, no meningismus, no nuchal rigidity, no carotid bruit, no lymphadenopathy    Respiratory   Respiratory Effort: breathing comfortably, no increased work of breathing  Auscultation: CTA bilaterally with symmetric breath sounds, good air  movement, no crackles bronchi or wheezing noted    Cardiovascular   Palpation: no thrill, no displacement of PMI  Auscultation: Regular rate and rhythm    Gastrointestinal   Abdomen: Soft, ND, mildly tender to palpation at umbilicus, improved    Skin   Inspection: no lower extremity edema, rashes, lesions, or ulcerations, well developed, turgor intact  Palpation: no subcutaneous nodules or induration    Musculoskeletal   Gait and Station: intact without difficulty    Mental Status   Judgement, insight: awake and alert  Orientation: oriented to time, place, and person  Memory: intact for recent and remote events  Mood and affect: no agitation, good eye contact              ASSESSMENT & PLAN  54 year old male who is status post op Umbilical hernia repair on 09/21/09. Patient has had healing of the incision site, no signs of any complications. His pain has slowly decreased and is continually getting better.  - Patient was given prescrition for Vicodin 5/500, only 10 pills prescribed and told patient to switch to Tylenol after that.   -  Patient will be discharged from our clinic, and can return as needed.  SERVICE ORDERS   [phone] - Patient Encounter Documented Using an EHR Certified by [employer] [*CPT-G8447]  [phone] - Current Medications with Name, Dosages, Frequency and Route Documented [*CPT-G8427]  3016F - Unhealthy Alcohol Use Screening Performed [*CPT-3016F]

## 2010-01-29 NOTE — Unmapped (Signed)
THE [facility]     PATIENT NAME:   [name], [name]                 MR #:  [id number]   DATE OF BIRTH:  1955-10-31                        ACCOUNT #:  [id number]   ED PHYSICIAN:   Leia Alf, M.D.             ROOM #:   PRIMARY:        No Pcp No Pcp                     NURSING UNIT:  ED   REFERRING:      Selected Referral Pt              FC:  S   DICTATED BY:    Shelbie Ammons. Meiman, CNP             ADMIT DATE:  01/29/2010   VISIT DATE:                                       DISCHARGE DATE:                               EMERGENCY DEPARTMENT NOTE     *-*-*     CHIEF COMPLAINT:  Abdominal pain.     HISTORY OF PRESENT ILLNESS:  This is a 55 year old gentleman who is status   post umbilical hernia repair in August of 2011.  He presents here today with   a three-day history of pain to his umbilical area.  He does endorse that he   is nauseated, however, he has not had any actual vomiting.  His pain is   described as a 7/10, located to his umbilicus without radiation.  He is able   to tolerate a regular diet and has had normal bowel movements without any   mucus or blood.  He denies any sensation of fever, night sweats or chills.   He states that he is a type 2 diabetic and has been out of his medications   for several months due to loss of insurance.  He denies increased thirst, any   unexplained weight loss.     PAST MEDICAL HISTORY:     1.  Hypertension.   2.  Diabetes.     CURRENT MEDICATIONS:     1.  Metformin.   2.  Hydrochlorothiazide.   3.  Albuterol.     ALLERGIES:  He states an allergy to none.     SOCIAL HISTORY:  He smokes a half-pack of cigarettes a day.  Denies alcohol   or drug use.     FAMILY HISTORY:  Noncontributory.     REVIEW OF SYSTEMS:  The patient denies fever, sweats, chills, headache,   vision or hearing problems, chest pain, shortness of breath, productive   cough, dysuria, hematuria, urinary frequency, diabetes, thyroid, skin   problems, anemia, easy bruising,  previous stroke or seizures.  All other   systems are negative as reviewed with the patient.     PHYSICAL EXAMINATION:     VITAL SIGNS:  Blood pressure 144/100, pulse 75, respiratory rate 18,   temperature 97.7, pulse oximetry 98%  on room air.   GENERAL APPEARANCE:  Well-developed, well nourished, no acute distress.   SKIN:  Warm and dry.  No rash.   HEENT:  Normocephalic, atraumatic.  Pupils equal, round and reactive to   light.  Extraocular movements intact.  Conjunctivae clear.  Good dentition.   Oropharynx clear.   NECK:  Supple.  No masses.  Carotids 2+ bilaterally without bruit.  No   cervical or submandibular adenopathy.  Thyroid without nodules.   LUNGS:  Breathing comfortably.  No rales, rhonchi or wheezes.   HEART:  Regular rate and rhythm.  Normal S1 and S2.  No murmur, gallop or   rub.   ABDOMEN:  No hepatosplenomegaly.  No masses or tenderness.  No aortic/renal   bruit.  He has no tenderness to palpation to his umbilicus.  There is no   surrounding erythema.  There is no bulging.  He has a negative Murphy's sign.   He has no tympany to percussion and he has positive bowel sounds.   BACK:  No CVA tenderness.   EXTREMITIES:  No edema.  No cyanosis.  Dorsalis pedis pulses 2+.   NEUROLOGIC:  Cranial nerves II through XII intact.  Sensory intact to light   touch.  Motor grossly intact.   PSYCHIATRIC:  Oriented x 3.  Affect normal.     EMERGENCY DEPARTMENT COURSE:  The patient was brought back to A pod 10 and   examined by Dr. Glynis Smiles and myself.     He did an abdominal x-ray which was negative for obstruction pattern.     He had a CBC which showed no leukocytosis and a hemoglobin and hematocrit   which showed no anemia.  His EP-1 was within normal limits.  His liver   enzymes were all within normal limits and a lipase of 35, a UA which was   negative for infection.     MEDICAL DECISION MAKING:  This 55 year old gentleman presents here today with   a three-day history of pain to his umbilical area with no  signs of hernia or   any signs of infection.  There is no erythema, edema.  He is afebrile, not   tachycardic.  He actually requested to sign out stating he did not want to   wait for all of his lab work to return, however, I did convince him to wait   which he did.  I am reluctant to place him on metformin as he was not   hyperglycemic here.  He has been out of his medication for quite some time.   He is slightly hypertensive with a blood pressure of 144/100.  Therefore, I   did give him a hydrochlorothiazide 25 mg prescription and also an albuterol   inhaler prescription.  I left his name with Crissie Figures in an attempt to   get him appropriate primary care and gave him the financial papers necessary   to fill out to establish primary care.  He was extremely nontoxic in   appearance, requesting food and ambulating throughout the emergency room   without any difficulty.  His abdomen was nontoxic and I do not believe he is   showing any signs of an acute abdomen at this time.     DISPOSITION:  The patient will be discharged home.     DIAGNOSIS:     1.  Umbilical pain.     PLAN:     1.  Hydrochlorothiazide 25 mg one tablet daily.   2.  Albuterol inhaler MDI two puffs every four hours as needed.   3.  Return for any worsening symptoms, abdominal pain, nausea, vomiting or     fever.   4.  He should receive a phone call from Crissie Figures to get himself     established in a primary care.   5.  Get his financial papers filled out and mailed in.     I presented this patient to Dr. Glynis Smiles, who saw the patient, agreed with my   assessment and plan.       *-*-*                                             _______________________________________   LHM/krm                                _____   D:  01/29/2010 18:15                  Shelbie Ammons. Georgiann Cocker, CNP   T:  01/29/2010 19:01   Job #:  [id number]                                         _______________________________________                                          _____                                          Leia Alf, M.D.                                  EMERGENCY DEPARTMENT NOTE                                                                PAGE    1 of   1                                          _____                                         Leia Alf, M.D.                                  EMERGENCY DEPARTMENT NOTE  PAGE    1 of   1
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
## 2010-02-07 ENCOUNTER — Inpatient Hospital Stay

## 2010-02-13 NOTE — ED Provider Notes (Unsigned)
PATIENT NAME:                 PA #:            MR #[name] Peck, [name] Peck                [id number]       [id number]            EMERGENCY ROOM PHYSICIAN:                ADM DATE:                    Thayer Headings, MD                02/13/2010                   DATE OF BIRTH:   AGE:           PATIENT TYPE:     RM #:              11-05-1955       54             ERK                                     CHIEF COMPLAINT:  Drainage from right eye.     This is a 55 year old gentleman who actually has 2 complaints.  He had some  redness, drainage and irritation of his right eye for 2 days.  He has had a  mild cough, no congestion.  No fever.  He has also noticed a rash on the back  of his neck on the right side for about a week.  No other specific  complaints.     PAST MEDICAL HISTORY: Hypertension, diabetes mellitus, sleep apnea.     MEDICATIONS:     1.  Metformin.  2.  _____       ALLERGIES:  PENICILLIN.     SOCIAL HISTORY: Positive for smoking, denies alcohol.     REVIEW OF SYSTEMS:    CONSTITUTIONAL:  No fever.  HEENT: Eyes, his vision is a little blurred on the right.  He has had some  discharge and drainage, irritation.  ENT, no sore throat or earache.  RESPIRATORY: Mild nonproductive cough.  GI:  No vomiting.  SKIN: Rash on the back of his neck as above.     PHYSICAL EXAMINATION:   GENERAL:  An alert black male in no acute distress.  VITAL SIGNS: Temp of 98.1, pulse 91, respiratory rate of 18, blood pressure  134/87, O2 saturation is 98%.  HEENT: Conjunctivae was mildly injected on the right with a small amount of  discharge.  There is erythema on the underside of both lids.  The cornea is  grossly clear.  Fluorescein staining is negative.  Pupils are equal, round  and reactive.  Fundi are benign.  He is not photophobic.  The anterior  chamber is clear.  TMs are clear.  Nose clear.  Oropharynx is negative.  HEART: Regular.  No murmurs or gallops noted.  LUNGS: Breath sounds equal bilaterally and  clear.  NECK: No adenopathy.  He has a dry, slightly hyperpigmented rash on the right  posterior neck about 5 x 7 cm with a  smaller 2 x 3 cm area on the left side.   It is secondarily infected.     DIAGNOSIS:  Conjunctivitis, eczema.     DISPOSITION AND PLAN:  An ophthalmology referral if not better in 4-5 days.   Erythromycin ophthalmic ointment q.i.d. x5 days in the right eye.  Kenalog  cream 0.1% 30 g t.i.d. to the rash.  Follow up with his primary care  physician if the rash does not resolve.                                            Thayer Headings, MD     [phone]  DD: 02/13/2010 09:45   DT: 02/13/2010 13:47   Job #: [id number]  CC:

## 2010-02-19 MED ORDER — HYDROCHLOROTHIAZIDE 25 MG PO TABS
25 MG | ORAL_TABLET | Freq: Every day | ORAL | Status: DC
Start: 2010-02-19 — End: 2010-10-17

## 2010-02-19 MED ORDER — METFORMIN HCL ER 500 MG PO TB24
500 MG | ORAL_TABLET | Freq: Two times a day (BID) | ORAL | Status: DC
Start: 2010-02-19 — End: 2010-08-20

## 2010-02-19 MED ORDER — NICOTINE 21 MG/24HR TD PT24
21 MG/24HR | MEDICATED_PATCH | TRANSDERMAL | Status: AC
Start: 2010-02-19 — End: 2011-02-19

## 2010-02-19 NOTE — Unmapped (Signed)
Signed by Georgena Spurling MD on 02/19/2010 at 00:00:00  Referral      Imported By: Betsey Amen 03/08/2010 06:28:05    _____________________________________________________________________    External Attachment:    Please see [employer] EMR for this document.

## 2010-02-19 NOTE — Assessment & Plan Note (Signed)
Smokes 8 cigarettes a day. Add nicotine patch. Literature.

## 2010-02-19 NOTE — Patient Instructions (Signed)
Sizzling Ways to Stop Smoking        Quitting smoking is one of the most daunting challenges you will face in your life. It is an addiction that is both physical and psychological. But, quitting smoking can be done. In fact, you will have plenty of company: millions of Americans are former smokers.   There are certainly plenty of reasons to quit when you consider smoking's fatal link with lung cancer , emphysema , and heart disease , and the harmful effects of second-hand smoke on your family.   You have seen the warnings. Heard the discussions. Received the advice. Listened to your kids nag you about it. You know you should quit smoking, but where do you start? Knowing what you are up against can help you form a successful plan to quit smoking.   The Mind and Body Connection   Smoking is addictiveboth physically and psychologically. The physical addiction can be traced to the nicotine in each cigarette. It hooks you just as completely as other drugs, say researchers, and the withdrawal symptomscravings, anxiety , nausea, cramps, depression , and dizzinessare similar.   Nicotine surges through the bloodstream and gives smokers a higha quick jolt that makes them think they feel better. But, what really happens is that smokers develop a tolerance for nicotine, which is why they increase from one pack a day to two packs a day.   The psychological addiction is, in its own way, just as bad. Smoking becomes second nature, like blinking or breathing. If you consider that one pack of cigarettes can turn into 150 to 200 puffs a day, seven days a week, 52 weeks a year, you will see how hard it is to de-program yourself.   The Key to Quitting   "You know, there is no magic bullet, no device that will make it easy," says Jenny Duffey, who smoked for 13 years before quitting in 1989, and has written a book and taught seminars on quitting. But, you can quit.   "The thing to keep in mind is that almost everyone who quits has to  try more than once," says Anne Davis, MD, a past president of the American Lung Association. "You should not be discouraged. It is more rare to quit on the first try than on the fifth"   The key to quitting, say the experts, is patience, perseverance, and having a plan.   How to Do It   Keep these points in mind when you quit:   Know Why You're Quitting   Pick a reason that you believe in, be it for your family or for yourself. If you do not believe in your reason, it is that much harder to quit.   Change Your Environment   Worry about not smoking for just one day, and not for the rest of your life. Besides, it gets easier to stave off the desire the longer you do not smoke. The nicotine will be gone from your system in 3-5 days, and after about a month the worst of the withdrawal symptoms will go away.   Taper Off   Some studies show that a majority of permanent quitters achieved their goal by quitting "cold turkey." But, there are many other options, like slowly decreasing the number of cigarettes you smoke.   The key to tapering off is to cut down the number of cigarettes you smoke each day. One way to do this, says Duffey, is to delay the first cigarette of the   day. She recommends the two-hour approach. If you have your first smoke at 7 a.m., try holding out until 9 for a couple of days. Then, push it back until 11, and so on. By the end of four weeks, you will not be smoking at all. Whether you taper or quit cold, your goal must be the same: abstinence. If you choose to taper, do not let the process give you an excuse to delay the final step of quitting entirely. Set a quit day and stick to it.   Overwhelm the Addiction   Think about the things that lead to lighting up, and do not do them. Get rid of the ashtrays at home. Do not come back from lunch 15 minutes early to sneak in a cigarette break. Avoid places where smoking is part of the atmosphere.   Practice the Three D's   Delay; deep breathing; drink water.  When you feel like a smoke, delay. Try to think of something else. Breathe deeply, and count to ten slowly as you do so. Drink water; aim for eight, eight-ounces (240 milliliters) a day, which helps flush the nicotine out of your system. Do something else, like chew gum, until the craving passes.   Keep a Diary   This technique, which has also been used effectively with people who eat too much, is surprisingly effective. Each time you feel like a cigarette, write down the time of day, what you are doing, and how badly you want to smoke on a scale of 1 to 3, with 1 for the worst craving. A diary, says Dr. Davis, helps you to learn to unlearn the responses that make you want to smoke.   "Just because you fail once does not mean quitting is out of reach," says Dr. Davis. "Half the battle is knowing that it may require several attempts, and feeling confident that you will eventually succeed."   Discuss Medication   You may want to discuss with your doctor medicines that are available to help with smoking cessation. One example is varenicline (Chantix). It helps by blocking the pleasant effects that nicotine causes on the brain.   In addition to varenicline, there are a range of other medicines available to help you quit smoking. Examples include nicotine replacement products; which may be in the form of chewing gum, lozenge, nasal spray, or patches; and an antidepressant called bupropion.   Based on the research available so far, it appears that varenicline works better than placebo and bupropion , another antidepressant used for quitting smoking.   Taking varenicline has been associated, however, with some side effects. The most frequently reported include: nausea, headache , insomnia , bad dreams, and changes in the way food tastes. Varenicline and bupropion may also increase the risk of serious mood and behavior changes.   While medicines may be a good options for you, these are definitely not a magic cure. You still  need to be very committed to quitting.   Work with Your Doctor   For the best results, work with your doctor. Together, you can test your lung function and compare the results to those of a non-smoking person. The results can be given to you as your lung age. Finding out your lung age right after having the test done may help you to stop smoking.   Your doctor can also discuss with you all of your options, such as:   ?? Over-the-counter nicotine patches, gum and lozenges, which may be used alone or   in combination   ?? Prescription nicotine inhalers or nasal sprays   ?? Prescription varenicline   ?? Prescription antidepressant bupropion   ?? Alternative therapies, like hypnosis and acupuncture   ?? Smoking cessation classes   ?? Self-help programsFor example, web and computer-based programs are an option. You can find many programs online, like the American Lung Association's Freedom From Smoking . There are also telephone quit lines, cell phone programs, and text messaging programs. To learn more about these options, visit smokefree.gov .   ?? Group therapy   Trying a combination of these options may work best for you. For example, using a nicotine patch and going to group therapy may help you to become smoke-free.   Reward Yourself for Succeeding   Quitting is hard and you deserve a reward for meeting your short-term goals like being quit for one week, two weeks, or a month. Give yourself something you really want but have been putting off getting. Remember how much money you are saving by not buying cigarettes!     Last Reviewed: March 2011 Brian Randall, MD   Updated: 04/11/2009
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
## 2010-02-19 NOTE — Assessment & Plan Note (Signed)
Present for one yr. Smokes 8 cigarettes a day. Uses inhalers. Gets short of breath when walking up one flight of stairs.
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
## 2010-02-20 LAB — URINALYSIS
Bilirubin, Urine: NEGATIVE
Glucose, UA: NEGATIVE mg/dl
Ketones, Urine: NEGATIVE mg/dl
Leukocyte Esterase, Urine: NEGATIVE
Nitrite, Urine: NEGATIVE
Protein, UA: NEGATIVE mg/dl
Specific Gravity, UA: 1.025 (ref 1.005–1.030)
Urobilinogen, Urine: 0.2 EU/dl (ref <2.0)
pH, UA: 6 (ref 4.5–8.0)

## 2010-02-20 LAB — CBC
Hematocrit: 47.9 % (ref 40.5–52.5)
Hemoglobin: 16.4 gm/dl (ref 13.5–17.5)
MCH: 32.7 pg (ref 26–34)
MCHC: 34.3 gm/dl (ref 31–36)
MCV: 95.5 fl (ref 80–100)
MPV: 10.7 fl — ABNORMAL HIGH (ref 5.0–10.5)
Platelets: 176 10*3 (ref 135–450)
RBC: 5.02 10*6 (ref 4.2–5.9)
RDW: 13.5 % (ref 11.5–14.5)
WBC: 6.9 10*3 (ref 4.0–11.0)

## 2010-02-20 LAB — MICROALBUMIN / CREATININE URINE RATIO
Creatinine, Ur: 270 mg/dl
Microalbumin Creatinine Ratio: 4.6 (ref 0.0–30.0)
Microalbumin, Random Urine: 1.23 mg/dl (ref 0.0–1.8)

## 2010-02-20 LAB — BASIC METABOLIC PANEL
BUN: 14 mg/dl (ref 7–18)
CO2: 31 mEq/L (ref 21–32)
Calcium: 9.4 mg/dl (ref 8.3–10.6)
Chloride: 104 mEq/L (ref 99–110)
Creatinine: 1.1 mg/dl (ref 0.9–1.3)
GFR Est, African/Amer: >60
GFR, Estimated: >60 (ref >60)
Glucose: 76 mg/dl (ref 70–99)
Potassium: 3.7 mEq/L (ref 3.5–5.1)
Sodium: 140 mEq/L (ref 136–145)

## 2010-02-20 LAB — CK: Total CK: 108 U/L (ref 38–174)

## 2010-02-20 LAB — LIPID PANEL
Cholesterol, Total: 205 mg/dl — ABNORMAL HIGH (ref <200)
HDL: 65 mg/dl — ABNORMAL HIGH (ref 40–60)
LDL Calculated: 128 mg/dl — ABNORMAL HIGH (ref <100)
Triglycerides: 61 mg/dl (ref <150)
VLDL Cholesterol Calculated: 12 mg/dl

## 2010-02-20 LAB — PSA SCREENING: PSA: 0.64 ng/ml

## 2010-02-20 LAB — AST: AST: 34 U/L (ref 15–37)

## 2010-02-21 LAB — HEMOGLOBIN A1C
A1c: 5.8 (ref 4.0–6.0)
eAG: 119.8 mg/dl

## 2010-02-22 NOTE — Telephone Encounter (Signed)
PATIENT STATES DR. MINHAS DOES NOT TAKE THIS PATIENT [place] [employer] PLAN STATUS INSURANCE?    PT ALSO INQUIRED ABOUT HIS TEST RESULTS FROM [facility] [address] LOCATION. HE STATES IT WAS FAXED ON 1.26.12.    INFORMED PATIENT DR. VICKERS OUT OF THE OFFICE.    THANK YOU

## 2010-02-23 NOTE — Telephone Encounter (Signed)
ok 

## 2010-02-23 NOTE — Telephone Encounter (Signed)
Please scan MRI report into system.   Dr Alinda Dooms does not take his "[place] insurance", see if we can refer to pain clinic at [facility], can send copy of office notes and MRI report.

## 2010-02-23 NOTE — Telephone Encounter (Signed)
Refer to [facility].  Discussed with pt

## 2010-02-26 MED ORDER — HYDROCODONE-ACETAMINOPHEN 7.5-750 MG PO TABS
ORAL_TABLET | Freq: Four times a day (QID) | ORAL | Status: DC | PRN
Start: 2010-02-26 — End: 2010-03-12

## 2010-02-26 NOTE — Progress Notes (Signed)
This office note has been dictated.
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
## 2010-02-26 NOTE — Patient Instructions (Signed)
Patient was instructed to get his mri and reports and bring them back for an appt

## 2010-03-12 MED ORDER — HYDROCODONE-ACETAMINOPHEN 7.5-750 MG PO TABS
ORAL_TABLET | Freq: Four times a day (QID) | ORAL | Status: DC | PRN
Start: 2010-03-12 — End: 2010-03-21

## 2010-03-12 NOTE — Patient Instructions (Signed)
Patient given instructions for his injection.

## 2010-03-12 NOTE — Progress Notes (Signed)
HISTORY OF PRESENT ILLNESS:  [name] presents for evaluation of neck pain  and upper thoracic pain.  He has pain in the neck.  Pain in his upper  thoracic spine.  He has pain when he rotates the cervical spine.  He has pain  when he flexes and extends.  He describes it as a sharp stabbing sensation.   Denies any pain down his arms.  He says the pain is about an 8/10.  Since  that last time he saw me he had an MRI scan of the cervical spine and MRI  scan of his thoracic spine.  The MRI scan at both levels did show  degenerative arthritis in the cervical spine and thoracic spine.     PHYSICAL EXAMINATION:  He can flex the cervical spine about 80 degrees,  extend 10, rotate 80 degrees.  Spurling maneuver is causative for neck pain.   Deep tendon reflexes are 1/4 at the biceps and triceps.  Pinprick sensation  is normal.  Muscle strength seemed to be good.  Negative Hoffmann.       IMPRESSION:    1.  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  2.  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  3.  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      PLAN:    1.  Will have the patient under cervical facet joint injections.   2.  Renew the patient's hydrocodone.  3.  See him back in the office in a month.        Regis Bill Althia Egolf, MD     [id number]  DD: 03/12/2010 14:01  DT: 03/12/2010 17:57  [facility] File#: [id number]  Job #: [id number]  CC:

## 2010-03-12 NOTE — Progress Notes (Signed)
This office note has been dictated.

## 2010-03-13 NOTE — Unmapped (Signed)
Signed by Thedore Mins on 03/13/2010 at 07:46:25    Clinical Lists Changes  Referral approved for pulm sleep clinic and sent to call center for scheduling.

## 2010-03-21 MED ORDER — HYDROCODONE-ACETAMINOPHEN 7.5-750 MG PO TABS
ORAL_TABLET | Freq: Four times a day (QID) | ORAL | Status: AC | PRN
Start: 2010-03-21 — End: 2010-04-20

## 2010-03-21 MED ORDER — TIOTROPIUM BROMIDE MONOHYDRATE 18 MCG IN CAPS
18 MCG | PACK | Freq: Every day | RESPIRATORY_TRACT | Status: DC
Start: 2010-03-21 — End: 2011-06-14

## 2010-03-21 NOTE — Assessment & Plan Note (Signed)
Mild elevation of LDL, give diet.

## 2010-03-21 NOTE — Progress Notes (Signed)
Subjective:      Patient ID: [name] Peck is a 55 y.o. male.    Hypertension  This is a chronic problem. The current episode started more than 1 year ago. Pertinent negatives include no anxiety, blurred vision, chest pain, headaches, malaise/fatigue, neck pain, orthopnea, palpitations, peripheral edema, PND, shortness of breath or sweats. There are no associated agents to hypertension. Risk factors for coronary artery disease include dyslipidemia, family history, male gender and smoking/tobacco exposure. Past treatments include diuretics. The current treatment provides significant improvement. There is no history of angina, kidney disease, CVA, heart failure, left ventricular hypertrophy, PVD, renovascular disease, retinopathy or a thyroid problem. There is no history of chronic renal disease, coarctation of the aorta, hyperaldosteronism, hypercortisolism, hyperparathyroidism, a hypertension causing med, pheochromocytoma or sleep apnea.       Review of Systems   Constitutional: Negative for fever, chills, malaise/fatigue, activity change, appetite change, fatigue and unexpected weight change.   HENT: Negative for hearing loss, ear pain, nosebleeds, congestion, sore throat, facial swelling, mouth sores, neck pain, neck stiffness, voice change, postnasal drip, tinnitus and ear discharge.    Eyes: Negative for blurred vision, discharge, redness and visual disturbance.   Respiratory: Negative for apnea, cough, chest tightness, shortness of breath and wheezing.    Cardiovascular: Negative for chest pain, palpitations, orthopnea, leg swelling and PND.   Gastrointestinal: Negative for nausea, vomiting, abdominal pain, constipation and rectal pain.   Genitourinary: Negative for dysuria, urgency, frequency, hematuria, flank pain, decreased urine volume, discharge, penile swelling, scrotal swelling, difficulty urinating and testicular pain.   Musculoskeletal: Negative for myalgias, back pain, joint swelling, arthralgias and  gait problem.   Skin: Negative for color change, pallor and rash.   Neurological: Negative for dizziness, tremors, seizures, syncope, speech difficulty, weakness and headaches.   Hematological: Negative for adenopathy. Does not bruise/bleed easily.   Psychiatric/Behavioral: Negative for sleep disturbance, decreased concentration and agitation.       Objective:   Physical Exam   Nursing note and vitals reviewed.  Cardiovascular: Normal rate and regular rhythm.  Exam reveals no gallop and no friction rub.    No murmur heard.  Pulmonary/Chest: No respiratory distress. He has no wheezes. He has no rales. He exhibits no tenderness.       Assessment:      Degenerative cervical disc  I did get results of MRI 07/2007 mild disc bulging and reverse curvature of C spine. Thoracic no herniated disc or spinal stenosis. Scheduled for ESI per Dr Payton Spark, tomorrow.    COPD (chronic obstructive pulmonary disease)  Stable at this time. SOB on exercising. No recent cxr. Does smoke. Given nicotine patches last time.    Hyperlipidemia  Mild elevation of LDL, give diet.    Hematuria  3-5 rbc's, 6-10 wbc's, history of BPH per pt's comments. Ck urine culture and repeat u/a after culture report is back.    Pyuria  Get urine culture.    Hypertension  Borderline bp elevation, continue same meds, diet, no salt added counseled pt.        COPD, get cxr and pft's  spirva inhaler      Plan:

## 2010-03-21 NOTE — Assessment & Plan Note (Signed)
3-5 rbc's, 6-10 wbc's, history of BPH per pt's comments. Ck urine culture and repeat u/a after culture report is back.

## 2010-03-21 NOTE — Progress Notes (Addendum)
Addended by: Romie Levee on: 03/21/2010 10:05 AM    Modules accepted: Orders  Lab Review   Hospital Outpatient Visit on 02/20/2010   Component Date Value   ??? PSA 02/20/2010 0.64    ??? Color, UA 02/20/2010 YELLOW    ??? Clarity, UA 02/20/2010 CLEAR    ??? Glucose, UA 02/20/2010 NEGATIVE    ??? Bilirubin, Urine 02/20/2010 NEGATIVE    ??? Ketones, Urine 02/20/2010 NEGATIVE    ??? Specific Gravity, UA 02/20/2010 1.025    ??? Blood, Urine 02/20/2010 SMALL    ??? pH, UA 02/20/2010 6.0    ??? Protein, UA 02/20/2010 NEGATIVE    ??? Urobilinogen, Urine 02/20/2010 0.2    ??? Nitrite, Urine 02/20/2010 NEGATIVE    ??? Leukocyte Esterase, Urine 02/20/2010 NEGATIVE    ??? WBC, UA 02/20/2010 6-10*   ??? RBC, UA 02/20/2010 3-5*   ??? Epi Cells 02/20/2010 3-5    ??? Bacteria, UA 02/20/2010 occ    ??? Mucus, UA 02/20/2010 1+    ??? Creatinine, Ur 02/20/2010 270    ??? MICROALBUMIN, RANDOM URI* 02/20/2010 1.23    ??? Microalb Creat Ratio 02/20/2010 4.6    Office Visit on 02/19/2010   Component Date Value   ??? WBC 02/20/2010 6.9    ??? RBC 02/20/2010 5.02    ??? Hemoglobin 02/20/2010 16.4    ??? Hematocrit 02/20/2010 47.9    ??? MCV 02/20/2010 95.5    ??? [facility] 02/20/2010 32.7    ??? MCHC 02/20/2010 34.3    ??? RDW 02/20/2010 13.5    ??? Platelets 02/20/2010 176    ??? MPV 02/20/2010 10.7*   ??? Sodium 02/20/2010 140    ??? Potassium 02/20/2010 3.7    ??? Chloride 02/20/2010 104    ??? CO2 02/20/2010 31    ??? Glucose 02/20/2010 76    ??? BUN 02/20/2010 14    ??? Creatinine, Ser 02/20/2010 1.1    ??? Calcium 02/20/2010 9.4    ??? GFR, Estimated 02/20/2010 >60    ??? GFR Est, African/Amer 02/20/2010 >60    ??? Cholesterol, Total 02/20/2010 205*   ??? Triglycerides 02/20/2010 61    ??? HDL 02/20/2010 65*   ??? LDL Calculated 02/20/2010 191*   ??? VLDL CHOLESTEROL CALCULA* 02/20/2010 12    ??? AST 02/20/2010 34    ??? Total CK 02/20/2010 108    ??? Timing Interval 02/20/2010 Routine    ??? A1c 02/20/2010 5.8    ??? eAG 02/20/2010 119.8
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
## 2010-03-21 NOTE — Telephone Encounter (Signed)
Called and informed pt that Dr Louie Boston did authorize his request for Vicodin ES # [id number], and that it was called in to the [facility], thx.

## 2010-03-21 NOTE — Assessment & Plan Note (Signed)
Get urine culture.

## 2010-03-21 NOTE — Assessment & Plan Note (Signed)
I did get results of MRI 07/2007 mild disc bulging and reverse curvature of C spine. Thoracic no herniated disc or spinal stenosis. Scheduled for ESI per Dr Payton Spark, tomorrow.

## 2010-03-21 NOTE — Assessment & Plan Note (Signed)
Borderline bp elevation, continue same meds, diet, no salt added counseled pt.

## 2010-03-21 NOTE — Patient Instructions (Signed)
Eating a Diet Low in Saturated Fat, Trans Fat, and Cholesterol     Here's Why:      The major kinds of fats in the foods we eat are saturated, polyunsaturated, monounsaturated, and trans fatty acids. Saturated fats, trans fats, and dietary cholesterol raise blood cholesterol levels. A high level of cholesterol in the blood is a major risk factor for coronary heart disease , which leads to heart attack .   Dietary cholesterol affects cholesterol levels to a much lesser degree than was originally thought and also much less than saturated and trans fats. Since saturated fat and cholesterol are often found together in foods, by limiting saturated fat, cholesterol intake will go down as well.   Both types of unsaturated fats may help lower your cholesterol level when used in place of saturated fats in your diet.   Be moderate in eating all types of fat because fats contain more than twice the calories of either protein or carbohydrate. One gram of fat contains nine calories, while one gram of protein or carbohydrate provides four calories.   Here's How:   Foods often have more than one type of fat. As a general rule, those that have mostly saturated fat are thicker (ie, butter, lard, cream), while those that are mostly unsaturated are thinner (ie, oils).   Saturated Fat   Foods rich in saturated fat include:   ?? Whole milk   ?? Cream   ?? Ice cream   ?? Whole-milk cheeses   ?? Butter   ?? Lard   ?? Meats   ?? Palm and palm kernel oil   ?? Coconut oil   ?? Cocoa butter     Many snack foods and fried foods are also rich in saturated fat. Check the Nutrition Facts label to find the saturated fat content of a specific food.   For many of these foods that are naturally rich in saturated fat, there are low-fat versions. Some are more palatable than others, so try a variety of them to find ones you like. Use these lower-fat versions, or use the original versions infrequently. Also, try to choose naturally lower-fat foods. For example,  have fruit and gingersnaps for dessert instead of ice cream. And eat fish and vegetarian-based dinners several times a week in place of meat.   Trans Fat   Through the process of hydrogenation, trans fats are made. This process takes a vegetable oil, which is naturally high in unsaturated fatty acids and adds hydrogen molecules to it to make it more saturated and more solid. Depending on how many hydrogens are added, the result of this process can be a hydrogenated oil or a solid margarine. These products do not contain cholesterol, as butter does; any food that comes from a plant does not contain cholesterol.   Hydrogenated oils are used to make many processed snack foods.   Foods that may contain trans fat include:   ?? Margarine   ?? Cookies   ?? Crackers   ?? Cakes   ?? French fries   ?? Fried onion rings   ?? Donuts     Check the trans fats listed on the Nutrition Facts food label. You can also look at the ingredient list, if "hydrogenated oil" or "partially hydrogenated oil" is listed, that means the food contains trans fat.   Butter vs. Margarine   Since both the saturated fat in butter and the trans fat in margarine can raise cholesterol levels, which is the best   one to eat? There is no definitive answer to this question. When choosing your spread, consider the following:   ?? The softer the better:   ?? Whipped butter has less saturated fat than stick butter   ?? Liquid and soft tub margarine contain little saturated fat or trans fat   ?? Whichever you choose, limit the amount you use.   ?? When cooking and baking, substitute an unsaturated oil (see below) for butter or margarine.   Unsaturated Fat   You can feel good about eating this type of fat! However, unsaturated fats still deliver as many calories as the saturated varieties, so do not go overboard.   Foods rich in polyunsaturated fats include:   ?? Certain oils:   ?? Safflower   ?? Sesame   ?? Soy   ?? Corn   ?? Sunflower-seed   ?? Nuts   ?? Seeds     Foods rich in  monounsaturated fats include:   ?? Certain oils:   ?? Olive   ?? Canola   ?? Peanut   ?? Avocados     It is easy to work these foods into your diet:   ?? Combine nuts, seeds, dry cereal, and dried fruit for a snack mix.   ?? Use mashed avocado as a sandwich or bagel spread.   ?? In sesame oil, saute vegetables, tofu, and peanuts.   ?? Bake pecans or walnuts into breads, pancakes, and muffins.   ?? Use an oil sprayer for your cooking oils; spray meats and vegetables and sprinkle with herbs before cooking.   ?? Coat salmon or tuna steaks in sesame oil and sesame seeds before broiling.     Last Reviewed: January 2011 Brian Randall, MD   Updated: 01/04/2009

## 2010-03-21 NOTE — Assessment & Plan Note (Signed)
Stable at this time. SOB on exercising. No recent cxr. Does smoke. Given nicotine patches last time.

## 2010-03-22 LAB — BASIC METABOLIC PANEL
BUN: 11 mg/dl (ref 7–18)
CO2: 24 mEq/L (ref 21–32)
Calcium: 8.9 mg/dl (ref 8.3–10.6)
Chloride: 110 mEq/L (ref 99–110)
Creatinine: 1 mg/dl (ref 0.9–1.3)
GFR Est, African/Amer: >60
GFR, Estimated: >60 (ref >60)
Glucose: 88 mg/dl (ref 70–99)
Potassium: 3.9 mEq/L (ref 3.5–5.1)
Sodium: 139 mEq/L (ref 136–145)

## 2010-03-22 NOTE — Op Note (Unsigned)
PATIENT NAME:                 PA #:            MR #[name] Peck, [name] Peck                [id number]       [id number]            SURGEON:                              SURG DATE:  DIS DATE:          Regis Bill Tempest Frankland, MD                   03/22/2010                     DATE OF BIRTH:   AGE:           PATIENT TYPE:     RM #:              22-Jun-1955       54             [facility]                                     PREOPERATIVE DIAGNOSES:    1.  Right C4-C5 facet joint syndrome.  2.  Right C5-C6 facet joint syndrome.  3.  Right C6-C7 facet joint syndrome.  4.  Left C4-C5 facet joint syndrome.  5.  Left C5-C6 facet joint syndrome.  6.  Left C6-C7 facet joint syndrome.     POSTOPERATIVE DIAGNOSES:  1.  Right C4-C5 facet joint syndrome.  2.  Right C5-C6 facet joint syndrome.  3.  Right C6-C7 facet joint syndrome.  4.  Left C4-C5 facet joint syndrome.  5.  Left C5-C6 facet joint syndrome.  6.  Left C6-C7 facet joint syndrome.     PROCEDURE:   1.  Right C4-C5 facet joint injection.  2.  Right C5-C6 facet joint injection.  3.  Right C6-C7 facet joint injection.  4.  Left C4-C5 facet joint injection.  5.  Left C5-C6 facet joint injection.  6.  Left C6-C7 facet joint injection.     CHIEF COMPLAINT:  Neck pain.     HISTORY OF PRESENT ILLNESS:  The patient is a 55 year old male who presents  to Same Day Surgery at [facility] because of neck pain.  He has  pain in the neck, pain when he rotates the cervical spine, some pain when he  flexes and extends the cervical spine.  _____ cause of his pain.  It was  thought he was having facet joint syndrome and therefore is to undergo  diagnostic and therapeutic facet joint injections today.      ALLERGIES:  The patient is allergic to PENICILLIN.     PROCEDURE NOTE:  The patient was laid prone on the radiology table.  The  posterior portion of the cervical spine was cleaned with topical Betadine.  A  sterile drape covered the sterile area.  Lidocaine 1% was used as  local  anesthetic.  The patient was anesthetized by Anesthesiology and properly  monitored.  MAC anesthesia was used  by Anesthesiology at patients request  because of anxiety and patients poor tolerance to pain.  A 22-gauge,  3-1/2-inch spinal needle was inserted in the left C4-C5 cervical facet joint  under fluoroscopic guidance.  Aspiration was performed.  No blood or joint  fluid was aspirated.  Isovue was then injected into the facet joint.  Once a  facet joint arthrogram was obtained, a solution consisting of 1 mL of  betamethasone and 1% lidocaine was injected.  When this was done, a 22-gauge,  3-1/2-inch spinal needle was inserted in the left C5-C6 cervical facet joint  under fluoroscopic guidance.  Aspiration was performed.  No blood or joint  fluid was aspirated.  Isovue was then injected into the facet joint.  Once a  facet joint arthrogram was obtained, a solution consisting of 1 mL of  betamethasone and 1% lidocaine was injected.  When this was done, a 22-gauge,  3-1/2-inch spinal needle was inserted in the left C6-C7 cervical facet joint  under fluoroscopic guidance.  Aspiration was performed.  No blood or joint  fluid was aspirated.  Isovue was then injected into the facet joint.  Once a  facet joint arthrogram was obtained, a solution consisting of 1 mL of  betamethasone and 1% lidocaine was injected.  When this was done, a 22-gauge,  3-1/2-inch spinal needle was inserted in the right C4-C5 cervical facet joint  under fluoroscopic guidance.  Aspiration was performed.  No blood or joint  fluid was aspirated.  Isovue was then injected into the facet joint.  Once a  facet joint arthrogram was obtained, a solution consisting of 1 mL of  betamethasone and 1% lidocaine was injected.  When this was done, a 22-gauge,  3-1/2-inch spinal needle was inserted in the right C5-C6 cervical facet joint  under fluoroscopic guidance.  Aspiration was performed.  No blood or joint  fluid was aspirated.  Isovue was then  injected in the facet joint.  Once a  facet joint arthrogram was obtained, a solution consisting of 1 mL of  betamethasone and 1% lidocaine was injected.  When this was done, a 22-gauge,  3-1/2-inch spinal needle was inserted in the right C6-C7 cervical facet joint  under fluoroscopic guidance.  Aspiration was performed.  No blood or joint  fluid was aspirated.  Isovue was then injected into the facet joint.  Once a  facet joint arthrogram was obtained, a solution consisting of 1 mL of  betamethasone and 1% lidocaine was injected.  The patient tolerated all 6  procedures well.  The patient will be observed for about a half hour.  If he  suffers no side effect, will be discharged home.                                             Lyndle Herrlich, MD     [id number]  DD: 03/22/2010 11:57   DT: 03/22/2010 19:38   Job #: [id number]  CC: Romie Levee, MD  CC: Regis Bill Javarus Dorner, MD

## 2010-03-23 LAB — POCT GLUCOSE: POC Glucose: 90 mg/dl (ref 70–99)

## 2010-03-27 NOTE — Telephone Encounter (Signed)
Per Dr. Payton Spark he can take some over the counter pepcid.     Called and gave him the message he will go get this now.

## 2010-03-27 NOTE — Telephone Encounter (Signed)
Pt had injection last Thursday and since injections has had acid reflux and the hiccups -pls call pt

## 2010-03-29 MED ORDER — DEXILANT 60 MG PO CPDR
60 | ORAL_CAPSULE | Freq: Every day | ORAL | Status: DC
Start: 2010-03-29 — End: 2011-06-14

## 2010-03-29 NOTE — Assessment & Plan Note (Signed)
One week history since intubation for a cervical steroid injection.

## 2010-03-29 NOTE — Progress Notes (Signed)
Subjective:      Patient ID: [name] Peck is a 55 y.o. male.    HPI  GERD and intractable hiccoughs since intubation for a cervical spine injection one week ago.  Review of Systems   Constitutional: Negative for fever, chills, diaphoresis, activity change, appetite change, fatigue and unexpected weight change.   HENT: Positive for nosebleeds (In the AM on the side of the intubation).    Eyes: Negative for photophobia, pain, discharge, redness, itching and visual disturbance.   Respiratory: Negative.    Cardiovascular: Positive for chest pain (Since the procedure he has had heartburn  that occasionally feels like a heart attack.).        [Diaphoresis at night when sleep since the procedure.  Gastrointestinal: Negative.  Negative for nausea.        [Intractable hiccoughs.  Genitourinary: Negative.    Musculoskeletal: Negative.    Skin: Negative.    Neurological: Negative.    Hematological: Negative.    Psychiatric/Behavioral: Negative.        Objective:   Physical Exam   Constitutional: He is oriented to person, place, and time. He appears well-developed and well-nourished.   HENT:   Head: Normocephalic and atraumatic.   Eyes: Conjunctivae and EOM are normal. Pupils are equal, round, and reactive to light.   Neck: Normal range of motion. Neck supple.   Cardiovascular: Normal rate, regular rhythm and normal heart sounds.    Pulmonary/Chest: Effort normal and breath sounds normal.   Abdominal:        Intractable hiccoughs and will be started on a PPI and referred to GI for a work up.   Musculoskeletal: Normal range of motion.   Neurological: He is alert and oriented to person, place, and time.   Skin: Skin is warm and dry.   Psychiatric: He has a normal mood and affect. His behavior is normal. Judgment and thought content normal.       Assessment:      Intractable hiccoughs  One week history since intubation for a cervical steroid injection.    GERD (gastroesophageal reflux disease)  GERD since the intubation one week  ago.              Plan:      See orders.

## 2010-03-29 NOTE — Assessment & Plan Note (Signed)
GERD since the intubation one week ago.

## 2010-03-29 NOTE — Patient Instructions (Signed)
Start taking the Dexilant daily. See Dr. Randalyn Rhea later this month. See the gastroenterologist for follow up as well if you still have hiccoughs later this month. If you get chest pain again, go to the ER.

## 2010-04-06 ENCOUNTER — Other Ambulatory Visit: Payer: Self-pay | Admitting: General Surgery

## 2010-04-06 ENCOUNTER — Encounter (HOSPITAL_COMMUNITY)
Admission: RE | Admit: 2010-04-06 | Discharge: 2010-04-06 | Disposition: A | Discharge status: Home / Self Care | Payer: BC Managed Care – PPO | Source: Ambulatory Visit | Attending: General Surgery | Admitting: General Surgery

## 2010-04-06 LAB — BASIC METABOLIC PANEL
CO2: 29 mEq/L (ref 19–32)
Calcium: 9.7 mg/dL (ref 8.4–10.5)
Chloride: 101 mEq/L (ref 96–112)
GFR calc non Af Amer: >60 mL/min (ref >60)
Glucose, Bld: 76 mg/dL (ref 70–99)
Potassium: 3.8 mEq/L (ref 3.5–5.1)

## 2010-04-06 LAB — CBC
Hemoglobin: 15.7 g/dL (ref 13.0–17.0)
MCV: 97.5 fL (ref 78.0–100.0)
RBC: 4.81 MIL/uL (ref 4.22–5.81)
RDW: 13.8 % (ref 11.5–15.5)

## 2010-04-06 LAB — SURGICAL PCR SCREEN: Staphylococcus aureus: NEGATIVE

## 2010-04-08 NOTE — H&P (Signed)
  NAME:  Daniel [name], Daniel [name] NO.:  [id number]  MEDICAL RECORD NO.:  [id number]           PATIENT TYPE:  LOCATION:                                 FACILITY:  PHYSICIAN:  Dalia Heading, M.D.  DATE OF BIRTH:  Jan 02, 1956  DATE OF ADMISSION: DATE OF DISCHARGE:  LH                             HISTORY & PHYSICAL   CHIEF COMPLAINT:  Umbilical hernia, multiple lipomas.  HISTORY OF PRESENT ILLNESS:  The patient is a 55 year old white male who is referred for evaluation and treatment of an umbilical hernia as well as multiple lipomas.  He has had umbilical hernia for some time now, but it has recently increased in size and is causing him discomfort.  It is made worse with straining.  He also has multiple lipomas present, 4 of which have had changes in size.  PAST MEDICAL HISTORY:  Unremarkable.  PAST SURGICAL HISTORY:  Appendectomy, hemorrhoidectomy, and eyes surgery.  CURRENT MEDICATIONS:  None.  ALLERGIES:  No known drug allergies.  REVIEW OF SYSTEMS:  The patient smokes half pack of cigarettes a day. He denies any significant alcohol use.  He denies any other cardiopulmonary difficulties or bleeding disorders.  FAMILY MEDICAL HISTORY:  Noncontributory.  PHYSICAL EXAMINATION:  GENERAL:  The patient is a well-developed, well- nourished white male in no acute distress. HEENT:  Unremarkable. LUNGS:  Clear to auscultation with equal breath sounds bilaterally. HEART:  Regular rate and rhythm without S3, S4, or murmurs. ABDOMEN:  Soft, nontender, nondistended.  No hepatosplenomegaly or masses noted.  Large reducible umbilical hernia is present. SKIN:  Examination reveals multiple lipomas present over the body, but 2 of them are greater than 4 cm in size on the back and anterior abdominal wall as well as greater than 3 cm lipomas on the right forearm and left arm.  IMPRESSION: 1. Umbilical hernia. 2. Multiple lipomas.  PLAN:  The patient is scheduled for an  umbilical herniorrhaphy with mesh as well as excision of lipomas x4 on April 09, 2010.  The risks and benefits of the procedures including bleeding, infection, possible recurrence of the lipomas were fully explained to the patient, gave informed consent.     Dalia Heading, M.D.     MAJ/MEDQ  D:  04/05/2010  T:  04/06/2010  Job:  [id number]  cc:   Short Stay Nadara Mode, M.D. Fax: [phone]  Electronically Signed by Franky Macho M.D. on 04/08/2010 09:22:01 PM

## 2010-04-09 ENCOUNTER — Other Ambulatory Visit: Payer: Self-pay | Admitting: General Surgery

## 2010-04-09 ENCOUNTER — Ambulatory Visit (HOSPITAL_COMMUNITY)
Admission: RE | Admit: 2010-04-09 | Discharge: 2010-04-09 | Disposition: A | Discharge status: Home / Self Care | Payer: BC Managed Care – PPO | Source: Ambulatory Visit | Attending: General Surgery | Admitting: General Surgery

## 2010-04-09 DIAGNOSIS — Z01812 Encounter for preprocedural laboratory examination: Secondary | ICD-10-CM | POA: Insufficient documentation

## 2010-04-09 DIAGNOSIS — D1739 Benign lipomatous neoplasm of skin and subcutaneous tissue of other sites: Secondary | ICD-10-CM | POA: Insufficient documentation

## 2010-04-09 DIAGNOSIS — Z01818 Encounter for other preprocedural examination: Secondary | ICD-10-CM | POA: Insufficient documentation

## 2010-04-09 DIAGNOSIS — K429 Umbilical hernia without obstruction or gangrene: Secondary | ICD-10-CM | POA: Insufficient documentation

## 2010-04-10 NOTE — Op Note (Signed)
NAME:  Daniel [name], Daniel [name] NO.:  [id number]  MEDICAL RECORD NO.:  [id number]           PATIENT TYPE:  O  LOCATION:  [facility]                          FACILITY:  [facility]  PHYSICIAN:  Dalia Heading, M.D.  DATE OF BIRTH:  February 02, 1955  DATE OF PROCEDURE:  04/09/2010 DATE OF DISCHARGE:                              OPERATIVE REPORT   PREOPERATIVE DIAGNOSIS:  Umbilical hernia, multiple lipomas.  POSTOPERATIVE DIAGNOSIS:  Umbilical hernia, multiple lipomas.  PROCEDURE:  Umbilical herniorrhaphy, excision of multiple lipomas, right arm, left arm, back, and abdominal wall.  SURGEON:  Dalia Heading, MD  ANESTHESIA:  General.  INDICATIONS:  The patient is a 55 year old white male who presents with both the symptomatic umbilical hernias as well as multiple lipomas. This is intended to remove 4 of the largest lipomas which have changed in size recently.  These were located on the right arm, left arm, abdominal wall, and back.  Risks and benefits of all procedures including bleeding, infection, and recurrence of lipomas were fully explained to the patient, gave informed consent.  PROCEDURE NOTE:  The patient was placed in left lateral decubitus position after general anesthesia was administered.  The right lower back was prepped and draped using the usual sterile technique with DuraPrep.  Surgical site confirmation was performed.  A transverse incision was made over the right lower back lipoma which measured approximately 6 cm in size.  It was subcutaneous in nature and excised without difficulty.  It was sent to pathology for further examination.  Any bleeding was controlled using Bovie electrocautery. The skin was then instilled with 0.5 cm Sensorcaine.  The subcutaneous layer was reapproximated using 3-0 Vicryl interrupted sutures.  The skin was closed using 4-0 nylon simple sutures.  Betadine ointment and dry sterile dressing was applied.  Next, the right forearm lipoma  was excised.  It was approximately 3 cm in its greatest diameter.  It was subcutaneous in nature.  It was sent to pathology for further examination.  Any bleeding was controlled using Bovie electrocautery.  Sensorcaine 0.5% was instilled into the surrounding wound.  A 4-0 nylon simple sutures were then applied. Betadine ointment and dry sterile dressing were applied.  The identical procedure was done on a left arm lipoma.  This was sent to pathology for further examination.  The incision was closed using 4-0 nylon interrupted sutures.  Betadine ointment and dry sterile dressings were applied.  Next, the patient was left in the supine position.  A large 6-7 cm lipoma was noted on the left upper portion of the abdominal wall.  An oblique incision was made and this was excised without difficulty.  It was sent to pathology for further examination.  Any bleeding was controlled using Bovie electrocautery.  Sensorcaine 0.5% was instilled into the surrounding wound.  The subcutaneous layer was reapproximated using 3-0 Vicryl interrupted sutures.  The skin was closed using staples.  Betadine ointment and dry sterile dressing were applied.  Next, the umbilical hernia was addressed.  An infraumbilical incision was made down to the fascia.  The umbilicus was freed away from the underlying  hernia sac.  A small piece of omentum was incarcerated within the hernia sac and this was reduced without difficulty.  The ultimate defect was less than 1 cm in size.  It was elected to proceed with primary closure.  This was done transversely using 0-Ethibond interrupted sutures.  The base of the umbilicus was secured back to the fascia using a 2-0 Vicryl interrupted suture.  Subcutaneous layer was reapproximated using 3-0 Vicryl interrupted suture.  The skin was closed using staples.  Sensorcaine 0.5%was instilled into the surrounding wound.  Betadine ointment and dry sterile dressing were applied.  All tape  and needle counts were correct at the end of the procedure. The patient was extubated in the operating room and went back to recovery room awake and in stable condition.  COMPLICATIONS:  None.  SPECIMEN: 1. Back lipoma. 2. Right forearm lipoma. 3. Left arm lipoma. 4. Abdominal wall lipoma.  ESTIMATED BLOOD LOSS:  Minimal.     Dalia Heading, M.D.     MAJ/MEDQ  D:  04/09/2010  T:  04/10/2010  Job:  [id number]  cc:   Kirk Ruths, M.D. Fax: [phone]  Electronically Signed by Franky Macho M.D. on 04/10/2010 08:49:42 AM

## 2010-04-11 MED ORDER — HYDROCODONE-ACETAMINOPHEN 7.5-750 MG PO TABS
ORAL_TABLET | Freq: Three times a day (TID) | ORAL | Status: DC | PRN
Start: 2010-04-11 — End: 2010-05-08

## 2010-04-11 MED ORDER — BACLOFEN 10 MG PO TABS
10 MG | ORAL_TABLET | Freq: Three times a day (TID) | ORAL | Status: AC
Start: 2010-04-11 — End: 2010-05-11

## 2010-04-11 NOTE — Unmapped (Addendum)
Signed by Lenn Sink on 04/11/2010 at 09:05:18                         [facility]  [facility]  [address]           [address]    [place], [place] [address]\\\\\\            [place], [place] [address]    Tel: [phone] \\Fax: [phone]    [email or web address]            [email or web address]      April 11, 2010        RE: [name]  DOB:   May 26, 1955        You have been referred to our office by [facility].    We have not been able to reach you by phone at [phone].    Please call us at [phone], we would like to set up an appointment for you with one of our Sleep Medicine doctors.    Thank you.      Sincerely,        Georgena Spurling, MD, [place], D-ABSM  Associate Professor of Medicine,  Division of Pulmonary, Critical Care and Sleep Medicine  [employer] [facility]  Director, [facility]          Signed by [name] on 04/13/2010 at 12:18:01    pt called and is actually scheduled for an overnight sleep test at another facilitiy. pt said it is on 275,  pt also indicated he does not have any type of insurance (pt goes thru [facility] [employer] not [facility]).

## 2010-04-11 NOTE — Progress Notes (Signed)
HISTORY OF PRESENT ILLNESS:  Mr. Roskos presents for evaluation of neck pain.   The last time he saw me he had bilateral facet joint injections.  He had  excellent pain relief for 1 week and now he says the pain is returning.  He  has pain in the posterior portion of his cervical spine.  He has pain when he  rotates the cervical spine in either direction.  No pain down his arm.  He  says the pain is about a 7/10     PHYSICAL EXAMINATION:  He is oriented to person, place and time.  He has a  normal affect.  He can flex the cervical spine about 70 degrees, extend 10,  rotate 30 degrees in either direction.  He has pain at the end of all motion.   Deep tendon reflexes are 1/4 at the biceps and triceps.  Pinprick sensation  is normal.  Muscle strength is good.     IMPRESSION:    1.  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  2.  Cervical facet joint syndrome.     PLAN:    1.  Will renew the patient's hydrocodone.  2.  Start the patient on baclofen 10 mg p.o. t.i.d.   3.  Will have the patient undergo radiofrequency denervation once he gets  insurance.  The patient right now does not have any insurance.  4.  See him back in the office in 1 month.  5.  Have the patient sign a narcotic contract.  6.  Have the patient be urine tested.        Regis Bill Feleica Fulmore, MD     [id number]  DD: 04/11/2010 14:28  DT: 04/12/2010 08:02  SSI File#: [id number]  Job #: [id number]  CC:

## 2010-04-11 NOTE — Progress Notes (Signed)
This office note has been dictated.

## 2010-04-11 NOTE — Patient Instructions (Signed)
Narcotic contract was given

## 2010-04-18 MED ORDER — NYSTATIN-TRIAMCINOLONE 100000-0.1 UNIT/GM-% EX CREA
CUTANEOUS | Status: DC
Start: 2010-04-18 — End: 2010-05-16

## 2010-04-18 NOTE — Assessment & Plan Note (Signed)
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 Treat with triamcinolone cr and re eval in 2 weeks.

## 2010-04-18 NOTE — Progress Notes (Signed)
Subjective:      Patient ID: [name] Peck is a 55 y.o. male.    Rash  The current episode started more than 1 year ago. The problem has been gradually worsening since onset. The affected locations include the left hand and right hand. The rash is characterized by itchiness and peeling. Pertinent negatives include no anorexia, congestion, cough, diarrhea, eye pain, facial edema, fatigue, fever, joint pain, nail changes, rhinorrhea, shortness of breath, sore throat or vomiting. Past treatments include nothing. The treatment provided no relief.       Review of Systems   Constitutional: Negative for fever, chills, activity change, appetite change, fatigue and unexpected weight change.   HENT: Negative for hearing loss, ear pain, nosebleeds, congestion, sore throat, facial swelling, rhinorrhea, sneezing, drooling, mouth sores, neck pain, neck stiffness, dental problem, voice change, postnasal drip, sinus pressure, tinnitus and ear discharge.    Eyes: Negative for pain, discharge, redness and visual disturbance.   Respiratory: Negative for apnea, cough, choking, chest tightness, shortness of breath, wheezing and stridor.    Cardiovascular: Negative for chest pain, palpitations and leg swelling.   Gastrointestinal: Negative for nausea, vomiting, abdominal pain, diarrhea, constipation, blood in stool, abdominal distention, anal bleeding, rectal pain and anorexia.   Genitourinary: Negative for dysuria, urgency, frequency, hematuria, flank pain, decreased urine volume, discharge, penile swelling, scrotal swelling, difficulty urinating and testicular pain.   Musculoskeletal: Negative for myalgias, back pain, joint pain, joint swelling, arthralgias and gait problem.   Skin: Positive for rash. Negative for nail changes, color change, pallor and wound.   Neurological: Negative for dizziness, tremors, seizures, syncope, speech difficulty, weakness and headaches.   Hematological: Negative for adenopathy. Does not bruise/bleed  easily.   Psychiatric/Behavioral: Negative for hallucinations, behavioral problems, confusion, sleep disturbance, self-injury, dysphoric mood, decreased concentration and agitation. The patient is nervous/anxious. The patient is not hyperactive.        Objective:   Physical Exam   Nursing note and vitals reviewed.  Constitutional: He is oriented to person, place, and time. He appears well-developed and well-nourished.   HENT:   Head: Normocephalic and atraumatic.   Right Ear: External ear normal.   Left Ear: External ear normal.   Nose: Nose normal.   Mouth/Throat: Oropharynx is clear and moist.   Eyes: Conjunctivae and EOM are normal. Pupils are equal, round, and reactive to light.   Neck: Normal range of motion. Neck supple. No JVD present. No tracheal deviation present. No thyromegaly present.   Cardiovascular: Normal rate, regular rhythm, normal heart sounds and intact distal pulses.  Exam reveals no gallop and no friction rub.    No murmur heard.  Pulmonary/Chest: Effort normal and breath sounds normal. No stridor. No respiratory distress. He has no wheezes. He has no rales. He exhibits no tenderness.   Abdominal: Soft. Bowel sounds are normal. He exhibits no distension and no mass. No tenderness. He has no rebound and no guarding.   Musculoskeletal: He exhibits no edema and no tenderness.   Lymphadenopathy:     He has no cervical adenopathy.   Neurological: He is alert and oriented to person, place, and time. He has normal reflexes. He displays normal reflexes. No cranial nerve deficit. He exhibits normal muscle tone. Coordination normal.   Skin: Skin is warm and dry. Rash noted. No erythema. No pallor.        Eczema like rash of thumbs.   Psychiatric: He has a normal mood and affect. His behavior is normal. Judgment and thought  content normal.       Assessment:      Hematuria  Will need referral to urologist for microscopic hematuria.    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  Treat with triamcinolone cr and re eval in 2 weeks.              Plan:

## 2010-04-18 NOTE — Assessment & Plan Note (Signed)
Will need referral to urologist for microscopic hematuria.

## 2010-05-02 NOTE — Telephone Encounter (Signed)
Called and he will call next week to get his vicodin refilled

## 2010-05-02 NOTE — Telephone Encounter (Signed)
Pt had apt on 05/09/10 but [facility]Z is out his next available is 05/31/10 schedule pt for this apt but pt states he need's to bee seen this month because he is going to be out of medication and is needing a refill.  Pls call pt

## 2010-05-05 LAB — CULTURE, ROUTINE-ABSCESS

## 2010-05-08 MED ORDER — HYDROCODONE-ACETAMINOPHEN 7.5-750 MG PO TABS
ORAL_TABLET | Freq: Three times a day (TID) | ORAL | Status: AC | PRN
Start: 2010-05-08 — End: 2010-05-30

## 2010-05-08 NOTE — Telephone Encounter (Signed)
Called and told him the [facility] is where i called it in to.

## 2010-05-08 NOTE — Telephone Encounter (Signed)
Pt called back and the Px said that it is not called in, please recall it in to   [facility] [phone]

## 2010-05-08 NOTE — Telephone Encounter (Signed)
Pt would like to pick up rx for vicodin today because he has appt tomorrow.  Please call and advise - thanks.

## 2010-05-08 NOTE — Telephone Encounter (Signed)
Called and told him that the script was called in and that he was given enough to last until his appt on 5-3

## 2010-05-16 MED ORDER — LORAZEPAM 1 MG PO TABS
1 MG | ORAL_TABLET | Freq: Two times a day (BID) | ORAL | Status: AC | PRN
Start: 2010-05-16 — End: 2010-06-15

## 2010-05-16 NOTE — Assessment & Plan Note (Signed)
Mod OSA recent testing, being fitted with cpap(titration study).

## 2010-05-16 NOTE — Progress Notes (Signed)
Subjective:      Patient ID: [name] Peck is a 55 y.o. male.    Other  This is a chronic (Finger nail and hand rash persists.) problem. The current episode started more than 1 year ago. The problem has been gradually worsening. Pertinent negatives include no abdominal pain, anorexia, arthralgias, change in bowel habit, chest pain, chills, congestion, coughing, diaphoresis, fatigue, fever, headaches, joint swelling, myalgias, nausea, neck pain, numbness, sore throat, swollen glands, urinary symptoms, vertigo, visual change, vomiting or weakness. Rash: hands. Exacerbated by: Nerves. Treatments tried: Steriod cream. The treatment provided no relief.       Review of Systems   Constitutional: Negative for fever, chills, diaphoresis and fatigue.   HENT: Negative for congestion, sore throat and neck pain.    Respiratory: Negative for cough.    Cardiovascular: Negative for chest pain.   Gastrointestinal: Negative for nausea, vomiting, abdominal pain, anorexia and change in bowel habit.   Musculoskeletal: Negative for myalgias, joint swelling and arthralgias.   Skin: Rash: hands.   Neurological: Negative for vertigo, weakness, numbness and headaches.       Objective:   Physical Exam   Nursing note and vitals reviewed.  Constitutional: He appears well-developed and well-nourished.   HENT:   Head: Normocephalic and atraumatic.   Right Ear: External ear normal.   Left Ear: External ear normal.   Nose: Nose normal.   Mouth/Throat: Oropharynx is clear and moist.   Eyes: Conjunctivae and EOM are normal. Pupils are equal, round, and reactive to light.   Neck: Normal range of motion. Neck supple.   Cardiovascular: Normal rate, regular rhythm, normal heart sounds and intact distal pulses.    Pulmonary/Chest: Effort normal and breath sounds normal.   Abdominal: Soft. Bowel sounds are normal.   Skin: Skin is warm and dry.        Rash of fingers, distal aspects.   Psychiatric: He has a normal mood and affect. His behavior is normal.  Judgment and thought content normal.       Assessment:      Obstructive sleep apnea  Mod OSA recent testing, being fitted with cpap(titration study).    Hematuria  No gross hematuria,    urology referral. CK u/a today.    Dermatitis  Nervous with minimal improvement in  Hand rash. Picking at nails. Ativan 1mg  BID prn.              Plan:      Ok for cpap titration and fitting for cpap.

## 2010-05-16 NOTE — Assessment & Plan Note (Signed)
Nervous with minimal improvement in  Hand rash. Picking at nails. Ativan 1mg  BID prn.

## 2010-05-16 NOTE — Assessment & Plan Note (Signed)
No gross hematuria, awaiting INS for urology referral. CK u/a today.

## 2010-05-24 NOTE — Progress Notes (Signed)
Lab due at next visit.

## 2010-05-31 LAB — URINALYSIS
Bilirubin, Urine: NEGATIVE
Glucose, UA: NEGATIVE mg/dl
Ketones, Urine: NEGATIVE mg/dl
Leukocyte Esterase, Urine: NEGATIVE
Nitrite, Urine: NEGATIVE
Specific Gravity, UA: >=1.03 (ref 1.005–1.030)
Urobilinogen, Urine: 1 EU/dl (ref <2.0)
pH, UA: 6 (ref 4.5–8.0)

## 2010-05-31 MED ORDER — HYDROCODONE-ACETAMINOPHEN 7.5-750 MG PO TABS
ORAL_TABLET | Freq: Four times a day (QID) | ORAL | Status: DC | PRN
Start: 2010-05-31 — End: 2010-06-28

## 2010-05-31 NOTE — Progress Notes (Signed)
HISTORY OF PRESENT ILLNESS:  [name] Peck presents for evaluation of low back  and leg pain.  He has pain in his back and pain down the left leg.  He has  pain when he stands and when he walks.  Less pain when he lies down.  He does  not have any neurogenic bowel or bladder.  No fevers or chills at nighttime.   He says the pain is about 8/10.  It has been getting worse.  He has been  taking the hydrocodone and it does not help.  He also has pain in his neck.   Pain when he rotates the cervical spine in any direction.      DICTATION ENDS HERE        [name] Bill Akiem Urieta, MD     [id number]  DD: 05/31/2010 13:56  DT: 05/31/2010 17:25  SSI File#: [id number]  Job #: [id number]  CC:

## 2010-05-31 NOTE — Progress Notes (Signed)
HISTORY OF PRESENT ILLNESS:  [name] Peck presents for evaluation of low back  and left leg pain.  He has pain down his left leg.  He has pain when he  stands and when he walks.  He says the pain is about 6/10.  He has less pain  when he lies down.  Does not have any neurogenic bowel or bladder.  No fever  or chills at nighttime.  No pain when he coughs or sneezes.  He has been  taking hydrocodone and says at times it helps control his pain but standing  and walking do cause him severe pain.       He also has pain in his neck.  He has pain when he rotates the cervical spine  and some pain when he flexes and extends the cervical spine but he says his  low back pain is worse than his neck pain.  He says the neck pain is about  5/10.       PHYSICAL EXAMINATION:  GENERAL:  He is oriented to person, place, and time.   Normal affect.  MUSCULOSKELETAL:  He can flex the lumbar spine about 70  degrees, extend about 10, laterally bend 10.  Straight leg raise is causative  for low back pain.  Deep tendon reflexes are 1/4 at the knees and the ankles.   Pinprick sensation seems to be normal.  Muscle strength seems to be good.   No clonus present in the lower extremity.  He can flex the cervical spine  about 70 degrees, extend about 10, rotate _____ degrees.  Spurling maneuver  is causative for neck pain.  Deep tendon reflexes are 1/4 at the knees and  the ankles.  Pinprick sensation is normal.  Muscle strength is good.       IMPRESSION:  1.  Degenerative disk disease of the lumbar spine.  2.  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     PLAN:  1.  Will renew patient's hydrocodone.  2.  Have patient undergo a left L4 and a left S1 selective epidural steroid  injection.        Regis Bill Kindall Swaby, MD     [phone]  DD: 05/31/2010 13:56  DT: 05/31/2010 19:04  SSI File#: [id number]  Job #: [id number]  CC:

## 2010-05-31 NOTE — Progress Notes (Signed)
This office note has been dictated.

## 2010-06-01 MED ORDER — CIPROFLOXACIN HCL 500 MG PO TABS
500 MG | ORAL_TABLET | ORAL | Status: AC
Start: 2010-06-01 — End: 2010-06-12

## 2010-06-04 NOTE — ED Provider Notes (Unsigned)
PATIENT NAME:                 PA #:            MR #[name] Peck, [name] Peck                [id number]       [id number]            EMERGENCY ROOM PHYSICIAN:                ADM DATE:                    Thayer Headings, MD                06/04/2010                   DATE OF BIRTH:   AGE:           PATIENT TYPE:     RM #:              [birth year]       54             ERK                                     CHIEF COMPLAINT:  Right facial abscess.     This is a 55 year old with a painful swollen area along his right jaw area  for several days.  No fever, no drainage.      PAST MEDICAL HISTORY:  Chronic back pain, diabetes mellitus, hypertension.     MEDICATIONS:   1.  Metformin.  2.  Vicodin.  3.  Hydrochlorothiazide.     ALLERGIES:  PENICILLIN.     SOCIAL HISTORY:  A 1/2 pack a day smoker.  Denies alcohol.     REVIEW OF SYSTEMS:    CONSTITUTIONAL:  No fever.  ENT:  Swollen painful area on his right jaw.  CARDIOVASCULAR:  Negative.  PULMONARY:  Negative.  GI:  Negative.     PHYSICAL EXAMINATION:     GENERAL:  Alert white male in no acute distress.    HEENT:  Conjunctivae are clear.  TMs clear.  Nose clear.  He has a 3 cm  fluctuant abscess over his right anterior mandible.  His intraoral exam is  negative.  NECK:  Supple without adenopathy.     PROCEDURE:  Incision and drainage of abscess.     Local 5 mL of 1% lidocaine plain prep.  An 8-10 mm incision was made.  A  large amount of foul smelling purulent material was drained.  Loculations  were broken.  A 1/4 inch iodoform gauze packing was placed and a dressing was  placed.  He tolerated the procedure well.     DIAGNOSIS:  Complex abscess, face.     DISPOSITION AND PLAN:  Followup 3 days packing and removal.  Bactrim DS #20,  tramadol 50 mg #20.  Wound care instructions.                                               Thayer Headings, MD     [phone]  DD: 06/04/2010 14:06   DT: 06/04/2010 23:49   Job #: [id number]  CC:

## 2010-06-13 MED ORDER — LISINOPRIL-HYDROCHLOROTHIAZIDE 20-12.5 MG PO TABS
ORAL_TABLET | Freq: Every day | ORAL | Status: DC
Start: 2010-06-13 — End: 2011-06-14

## 2010-06-13 NOTE — Assessment & Plan Note (Signed)
Rash some better since stopped picking at hands so much. Using cream and anxiolytic agent with improvement. Will refill.

## 2010-06-13 NOTE — Assessment & Plan Note (Signed)
Has referral given to urology clinic at [facility] on last visit but he is waiting on his INS to come thru.

## 2010-06-13 NOTE — Assessment & Plan Note (Signed)
BP is up, out of meds, asked him to restart.  BP ck in 2 months.

## 2010-06-13 NOTE — Assessment & Plan Note (Signed)
Awaiting on INS to come thru to get sleep study.

## 2010-06-13 NOTE — Assessment & Plan Note (Signed)
AIC 5.8, continue same meds.

## 2010-06-13 NOTE — Progress Notes (Signed)
Subjective:      Patient ID: [name] Peck is a 55 y.o. male.    Hypertension  This is a chronic problem. The current episode started more than 1 year ago. The problem is uncontrolled. Pertinent negatives include no anxiety, blurred vision, chest pain, headaches, malaise/fatigue, neck pain, orthopnea, palpitations, peripheral edema, PND, shortness of breath or sweats. Past treatments include diuretics. The current treatment provides no improvement. Compliance problems: Not currently taking anything for bp.  There is no history of kidney disease, CAD/MI, heart failure, left ventricular hypertrophy, PVD, renovascular disease, retinopathy or a thyroid problem. There is no history of chronic renal disease, coarctation of the aorta, hyperaldosteronism, hypercortisolism, hyperparathyroidism, a hypertension causing med, pheochromocytoma or sleep apnea.       Review of Systems   Constitutional: Negative for fever, chills, malaise/fatigue, diaphoresis, activity change, appetite change, fatigue and unexpected weight change.   HENT: Negative for hearing loss, ear pain, nosebleeds, congestion, sore throat, facial swelling, sneezing, drooling, mouth sores, trouble swallowing, neck pain, neck stiffness, dental problem, voice change, postnasal drip, sinus pressure, tinnitus and ear discharge.    Eyes: Negative for blurred vision, photophobia, pain, discharge, redness, itching and visual disturbance.   Respiratory: Negative for apnea, cough, choking, chest tightness, shortness of breath and wheezing.    Cardiovascular: Negative for chest pain, palpitations, orthopnea, leg swelling and PND.   Gastrointestinal: Negative for nausea, vomiting, abdominal pain, constipation, blood in stool, anal bleeding and rectal pain.   Genitourinary: Negative for dysuria, urgency, frequency, hematuria, flank pain, decreased urine volume, discharge, penile swelling, scrotal swelling, enuresis, difficulty urinating and testicular pain.    Musculoskeletal: Negative for myalgias, back pain, joint swelling, arthralgias and gait problem.   Skin: Negative for color change, pallor, rash and wound.   Neurological: Negative for dizziness, tremors, seizures, syncope, facial asymmetry, speech difficulty, weakness, light-headedness, numbness and headaches.   Hematological: Negative for adenopathy. Does not bruise/bleed easily.   Psychiatric/Behavioral: Negative for suicidal ideas, hallucinations, behavioral problems, confusion, sleep disturbance, self-injury, dysphoric mood, decreased concentration and agitation. The patient is not nervous/anxious and is not hyperactive.        Objective:   Physical Exam   Constitutional: He is oriented to person, place, and time. He appears well-developed and well-nourished. No distress.   HENT:   Head: Normocephalic and atraumatic.   Right Ear: External ear normal.   Left Ear: External ear normal.   Nose: Nose normal.   Mouth/Throat: Oropharynx is clear and moist. No oropharyngeal exudate.   Eyes: Conjunctivae and EOM are normal. Pupils are equal, round, and reactive to light. Right eye exhibits no discharge. Left eye exhibits no discharge. No scleral icterus.   Neck: Normal range of motion. Neck supple. No JVD present. No tracheal deviation present. No thyromegaly present.   Cardiovascular: Normal rate, regular rhythm, normal heart sounds and intact distal pulses.  Exam reveals no friction rub.    No murmur heard.  Pulses:       Carotid pulses are 2+ on the right side, and 2+ on the left side.       Radial pulses are 2+ on the right side, and 2+ on the left side.        Femoral pulses are 2+ on the right side, and 2+ on the left side.       Popliteal pulses are 2+ on the right side, and 2+ on the left side.        Dorsalis pedis pulses are 2+ on the right  side, and 2+ on the left side.        Posterior tibial pulses are 2+ on the right side, and 2+ on the left side.   Pulmonary/Chest: Effort normal and breath sounds  normal. No stridor. No respiratory distress. He has no wheezes. He has no rales. He exhibits no tenderness.   Abdominal: Soft. Bowel sounds are normal. He exhibits no distension and no mass. There is no tenderness. There is no rebound and no guarding.   Musculoskeletal: Normal range of motion. He exhibits no edema and no tenderness.   Lymphadenopathy:     He has no cervical adenopathy.   Neurological: He is oriented to person, place, and time. He displays normal reflexes. No cranial nerve deficit. He exhibits normal muscle tone. Coordination normal.   Skin: Skin is warm and dry. No rash noted. He is not diaphoretic. No pallor.   Psychiatric: He has a normal mood and affect. His behavior is normal. Judgment and thought content normal.       Assessment:      Hematuria  Has referral given to urology clinic at [facility] on last visit but he is waiting on his INS to come thru.    Obstructive sleep apnea  Awaiting on INS to come thru to get sleep study.    Dermatitis  Rash some better since stopped picking at hands so much. Using cream and anxiolytic agent with improvement. Will refill.    Hypertension  BP is up, out of meds, asked him to restart.  BP ck in 2 months.    Diabetes mellitus  AIC 5.8, continue same meds.    Tobacco abuse  Given smoking cessation literature, can not afford chantix or nicotine patch.              Plan:      Hematuria  Has referral given to urology clinic at [facility] on last visit but he is waiting on his INS to come thru.    Obstructive sleep apnea  Awaiting on INS to come thru to get sleep study.    Dermatitis  Rash some better since stopped picking at hands so much. Using cream and anxiolytic agent with improvement. Will refill.    Hypertension  BP is up, out of meds, asked him to restart.  BP ck in 2 months.    Diabetes mellitus  AIC 5.8, continue same meds.    Tobacco abuse  Given smoking cessation literature, can not afford chantix or nicotine patch.        Recent UTI treated with antibiotics, getting  fu at urology at [facility]. CK PSA. Will repeat culture.

## 2010-06-13 NOTE — Assessment & Plan Note (Signed)
Given smoking cessation literature, can not afford chantix or nicotine patch.

## 2010-06-15 NOTE — Op Note (Signed)
NAME:  Daniel [name], Daniel [name] NO.:  [id number]   MEDICAL RECORD NO.:  [id number]          PATIENT TYPE:  AMB   LOCATION:  DAY                           FACILITY:  [facility]   PHYSICIAN:  Dalia Heading, M.D.  DATE OF BIRTH:  [birth year]   DATE OF PROCEDURE:  05/30/2006  DATE OF DISCHARGE:                               OPERATIVE REPORT   PREOPERATIVE DIAGNOSIS:  Perirectal abscess.   POSTOPERATIVE DIAGNOSIS:  Thrombosed hemorrhoid.   PROCEDURE:  Internal and external hemorrhoidectomy.   SURGEON:  Dalia Heading, M.D.   ANESTHESIA:  General.   INDICATIONS:  The patient is a 55 year old white male who presented to  my office with what he reported as drainage and induration in the  perirectal region.  Examination was limited secondary to pain.  The  patient now comes to the operating for presumed perirectal abscess.  Th  risks and benefits of the procedure including bleeding and infection  were fully explained to the patient, who gave informed consent.   PROCEDURE NOTE:  The patient was placed in lithotomy position after  general anesthesia was administered.  The perineum was prepped and  draped in the usual sterile technique with Betadine.  Surgical site  confirmation was performed.   On examination, no significant abscess cavity was found.  The patient  did have a significant internal thrombosed hemorrhoid at the 7 o'clock  position.  This was associated with mild swelling of the external  hemorrhoid.  It was elected to proceed with an internal and external  hemorrhoidectomy at this position.  A 2-0 Vicryl was used after excision  of the internal and external hemorrhoid to close the mucosal edges.  The  patient had mild hemorrhoidal disease at the 4 o'clock position and at  the 11 o'clock positions, though not significant enough to warrant  further surgery.  The perineum was instilled with 0.5% Sensorcaine.  No  fistulae or fissures were identified.  The rectum was  packed with  Surgicel and viscous Xylocaine.   All tape and needle counts were correct at the end of the procedure.  The patient was awakened transferred to PACU in stable condition.  Complications:  None.   SPECIMEN:  Thrombosed hemorrhoids.   BLOOD LOSS:  Minimal.      Dalia Heading, M.D.  Electronically Signed     MAJ/MEDQ  D:  05/30/2006  T:  05/30/2006  Job:  [id number]   cc:   Patrica Duel, M.D.  Fax: [phone]

## 2010-06-15 NOTE — H&P (Signed)
NAME:  Daniel [name], Daniel [name] NO.:  [id number]   MEDICAL RECORD NO.:  [id number]          PATIENT TYPE:  AMB   LOCATION:  DAY                           FACILITY:  [facility]   PHYSICIAN:  Dalia Heading, M.D.  DATE OF BIRTH:  [birth year]   DATE OF ADMISSION:  DATE OF DISCHARGE:  LH                              HISTORY & PHYSICAL   CHIEF COMPLAINT:  Perirectal abscess.   HISTORY OF PRESENT ILLNESS:  The patient is a 55 year old white male who  was referred for evaluation and treatment of a perirectal abscess.  He  started having pain and swelling earlier this week.  He was started on  antibiotics but the pain and swelling continued.   PAST MEDICAL HISTORY:  Unremarkable.   PAST SURGICAL HISTORY:  Wrist surgery.   CURRENT MEDICATIONS:  Flagyl, Percocet.   ALLERGIES:  No known drug allergies.   REVIEW OF SYSTEMS:  The patient smokes a pack of cigarettes a day.  He  denies any significant alcohol use.  He denies any cardiopulmonary  difficulties or bleeding disorders.   PHYSICAL EXAMINATION:  The patient is a well-developed, well-nourished,  white male in no acute distress.  LUNGS:  Clear to auscultation with equal breath sounds bilaterally.  HEART EXAMINATION:  Reveals a regular rate and rhythm without S3, S4,  murmurs.  RECTAL EXAMINATION:  Reveals moderate induration along the left side of  the anus.  Examination was limited secondary to pain.   IMPRESSION:  Perirectal abscess.   PLAN:  The patient is scheduled for incision and drainage of the  perirectal abscess on 05/30/2006.  The risks and benefits of the  procedure including bleeding, infection, recurrence of the abscess were  fully explained to the patient, gave informed consent.      Dalia Heading, M.D.  Electronically Signed     MAJ/MEDQ  D:  05/29/2006  T:  05/29/2006  Job:  [id number]   cc:   Short Stay   Patrica Duel, M.D.  Fax: [phone]

## 2010-06-21 NOTE — Progress Notes (Signed)
Lab due at next visit.

## 2010-06-28 MED ORDER — HYDROCODONE-ACETAMINOPHEN 7.5-750 MG PO TABS
ORAL_TABLET | Freq: Four times a day (QID) | ORAL | Status: DC | PRN
Start: 2010-06-28 — End: 2010-07-26

## 2010-06-28 NOTE — Progress Notes (Signed)
HISTORY OF PRESENT ILLNESS:  [name] presents for evaluation of low back  pain.  He has severe pain in his low back.  He has pain when he stands and  when he walks.  He has less pain when he lies down.  He does not have any  neurogenic bowel or bladder.  No fevers or chills at nighttime.       PHYSICAL EXAMINATION:  He is oriented to person, place and time, and has a  normal affect.  Straight leg raise is negative.  Deep tendon reflexes are 1/4  at the knees and the ankles.  Pinprick sensation is normal.  Muscle strength  is good.      IMPRESSION:  Degenerative disk disease of the lumbar spine.     PLAN:  1.  Will renew the patient's hydrocodone.  2.  Have the patient urine tested.  3.  Have the patient undergo epidural steroid injection.        [name] Bill Jerrian Mells, MD     [id number]  DD: 06/28/2010 14:42  DT: 06/29/2010 09:36  SSI File#: [id number]  Job #: [id number]  CC:

## 2010-06-28 NOTE — Progress Notes (Signed)
This office note has been dictated.

## 2010-06-28 NOTE — Telephone Encounter (Signed)
Unable to reach pt 06-06-10, 06-13-10, left another message today on voice mail. Still have not heard back.

## 2010-07-09 LAB — BASIC METABOLIC PANEL
BUN: 13 mg/dl (ref 7–18)
CO2: 27 mEq/L (ref 21–32)
Calcium: 8.8 mg/dl (ref 8.3–10.6)
Chloride: 107 mEq/L (ref 99–110)
Creatinine: 1 mg/dl (ref 0.9–1.3)
GFR Est, African/Amer: >60
GFR, Estimated: >60 (ref >60)
Glucose: 101 mg/dl — ABNORMAL HIGH (ref 70–99)
Potassium: 3.9 mEq/L (ref 3.5–5.1)
Sodium: 140 mEq/L (ref 136–145)

## 2010-07-09 LAB — POCT GLUCOSE: POC Glucose: 107 mg/dl — ABNORMAL HIGH (ref 70–99)

## 2010-07-09 NOTE — Op Note (Unsigned)
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     POSTOPERATIVE DIAGNOSES:  Degenerative disk disease of the lumbar spine  with radiculopathy.     PROCEDURES:    1.  Right L4 selective epidural steroid injection.  2.  Right S1 selective epidural steroid injection.  3.  Left L4 selective epidural steroid injection.  4.  Left S1 selective epidural steroid injection.     CHIEF COMPLAINT:  Low back, bilateral leg pain.     HISTORY OF PRESENT ILLNESS:  The patient is a 55 year old male who presents  to Same-Day Surgery at [facility] because of low back and  bilateral leg pain.  He has pain in the back, pain down both legs.  He has  pain when he stands and when he walks.   Less pain when he sits, less pain  when lies down.  He does not have any neurogenic bowel or bladder.  Because  of the patients pain, the patient will undergo therapeutic spinal injections  today.     ALLERGIES:  PENICILLIN.         PROCEDURE NOTE:  The patient was laid prone on the radiology table.  The  lower lumbar and upper sacral area was cleaned with topical Betadine.  A  sterile drape covered the sterile area.  One-percent lidocaine was used as  local anesthetic.  The patient was anesthetized by Anesthesiology and  properly monitored.  MAC anesthesia was used by Anesthesiology at the  patient's request because of anxiety and patient's poor tolerance to pain.       A 22-gauge, 3-1/2 inch spinal needle was inserted underneath the right L4  pedicle into the spinal canal.   Aspiration was performed.  No blood or CSF  fluid was aspirated.  Isovue was injected into the spinal canal.  An  extradural myelogram was obtained.  Once the right L4 neurogram  was seen, a  solution consisting of 1 mL of betamethasone and 1 mL of 2% lidocaine were  injected.       When this was done, a 22-gauge, 3-1/2 inch spinal needle was inserted  underneath the left L4 pedicle into the spinal canal.  Aspiration was  performed.  No blood or CSF fluid was aspirated.  Isovue was then injected  into the spinal canal.  An extradural myelogram was obtained.  Once the left  L4 neurogram was seen, a solution consisting of 1 mL of betamethasone and 1  mL of 2% lidocaine were injected.     When this was done, a 22-gauge, 3-1/2 inch spinal needle was inserted through  the left S1 posterior neural foramen into the spinal canal.  Aspiration was  performed.  No blood or CSF fluid was aspirated.  Isovue was then injected  into the spinal canal.  An extradural myelogram was obtained.  Once the left  S1 neurogram was seen, a solution consisting of 1 mL of betamethasone and 1  mL of 2% lidocaine were injected.     When this was done, a 22-gauge, 3-1/2 inch spinal needle was inserted through  the right S1 posterior neural foramen into the spinal canal.  Aspiration was  performed.  No blood or CSF fluid was aspirated.  Isovue was then injected  into the spinal canal.  An extradural myelogram was obtained.  Once the right  S1 neurogram was seen, a solution consisting of 1 mL of betamethasone and 1  mL of 2% lidocaine were injected.     The patient tolerated both procedures well.  The patient will be observed for  about one-half hour.  If he suffers no side effects, he will be discharged  home.                                            Regis Bill Damone Fancher, MD     [id number]  DD: 07/09/2010 12:01   DT: 07/09/2010 12:34   Job #: [id number]  CC: Romie Levee, MD  CC: Regis Bill Bridgid Printz, MD

## 2010-07-26 MED ORDER — HYDROCODONE-ACETAMINOPHEN 7.5-750 MG PO TABS
ORAL_TABLET | Freq: Four times a day (QID) | ORAL | Status: DC | PRN
Start: 2010-07-26 — End: 2010-08-22

## 2010-07-26 NOTE — Progress Notes (Signed)
HISTORY OF PRESENT ILLNESS:  Mr. Utter presents today for evaluation of low  back pain.  He has had pain in his back.  He states he is taking his  hydrocodone.  He says his pain is about 7/10.  He has less pain when he lies  down.  He does not have any neurogenic bowel or bladder.  No fever, chills at  nighttime.  Standing and walking do cause him to have pain.  He has less pain  when he lies down.  His urine tox screen did not show the presence of the  hydrocodone.  He has been given prescriptions for hydrocodone for the last  several months.      PHYSICAL EXAMINATION:  On physical exam today, straight leg raise is  negative. Deep tendon reflexes are 1/4 at the knees and ankles.  Pinprick  sensation and muscle strength seems to be good.       IMPRESSION:  1.  Degenerative disease of the lumbar spine.   2.  Chronic neuropathic pain.       PLAN:  The fact that the patient's urine drug screen did not show the  presence of the hydrocodone was discussed with him.  He states he does take  his medication.  At this point in time, he needs to have a urine tox screen  today.  After he has his urine tox screen today, we will renew his  hydrocodone.  He was informed that if it comes up negative one more time for  hydrocodone, then he will need to be discharged from this office and he will  no longer be getting the medication from me.          Regis Bill Anitta Tenny, MD     [id number]  DD: 07/26/2010 14:36  DT: 07/26/2010 19:10  [facility]I File#: [id number]  Job #: [id number]  CC:

## 2010-07-26 NOTE — Progress Notes (Signed)
This office note has been dictated.

## 2010-07-31 LAB — OPIATE, QUANTITATIVE, URINE: Opiates: POSITIVE

## 2010-08-20 MED ORDER — NABUMETONE 500 MG PO TABS
500 MG | ORAL_TABLET | Freq: Two times a day (BID) | ORAL | Status: DC
Start: 2010-08-20 — End: 2018-09-09

## 2010-08-20 MED ORDER — METFORMIN HCL ER 500 MG PO TB24
500 MG | ORAL_TABLET | Freq: Two times a day (BID) | ORAL | Status: DC
Start: 2010-08-20 — End: 2011-06-14

## 2010-08-20 NOTE — Assessment & Plan Note (Signed)
R elbow pain when leaning on the elbow.  Get xrays. nabumatone bid and re eval in one month.

## 2010-08-20 NOTE — Assessment & Plan Note (Signed)
Stable, refill inhalers.

## 2010-08-20 NOTE — Assessment & Plan Note (Addendum)
Previous urine culture.

## 2010-08-20 NOTE — Assessment & Plan Note (Signed)
Will refer when ins in force.

## 2010-08-20 NOTE — Progress Notes (Signed)
Subjective:      Patient ID: [name] Peck is a 55 y.o. male.    Arm Pain   Incident onset: R elbow pain one and a half month without injury. There was no injury mechanism. The pain is at a severity of 7/10. The pain is moderate. The pain has been constant since the incident. The symptoms are aggravated by movement (Leaning on the elbow.). He has tried nothing for the symptoms.       Review of Systems   Musculoskeletal:        [R elbow pain.      Objective:   Physical Exam   [nursing notereviewed.  Constitutional: He is oriented to person, place, and time. He appears well-developed and well-nourished. No distress.   HENT:   Head: Normocephalic and atraumatic.   Right Ear: External ear normal.   Left Ear: External ear normal.   Nose: Nose normal.   Mouth/Throat: Oropharynx is clear and moist. No oropharyngeal exudate.   Eyes: Conjunctivae and EOM are normal. Pupils are equal, round, and reactive to light. Right eye exhibits no discharge. Left eye exhibits no discharge. No scleral icterus.   Neck: Normal range of motion. Neck supple. No JVD present. No tracheal deviation present. No thyromegaly present.   Cardiovascular: Normal rate, regular rhythm, normal heart sounds and intact distal pulses.  Exam reveals no friction rub.    No murmur heard.  Pulses:       Carotid pulses are 2+ on the right side, and 2+ on the left side.       Radial pulses are 2+ on the right side, and 2+ on the left side.        Femoral pulses are 2+ on the right side, and 2+ on the left side.       Popliteal pulses are 2+ on the right side, and 2+ on the left side.        Dorsalis pedis pulses are 2+ on the right side, and 2+ on the left side.        Posterior tibial pulses are 2+ on the right side, and 2+ on the left side.   Pulmonary/Chest: Effort normal and breath sounds normal. No stridor. No respiratory distress. He has no wheezes. He has no rales. He exhibits no tenderness.   Abdominal: Soft. Bowel sounds are normal. He exhibits no  distension and no mass. There is no tenderness. There is no rebound and no guarding.   Musculoskeletal: Normal range of motion. He exhibits no edema and no tenderness.   Lymphadenopathy:     He has no cervical adenopathy.   Neurological: He is oriented to person, place, and time. He displays normal reflexes. No cranial nerve deficit. He exhibits normal muscle tone. Coordination normal.   Skin: Skin is warm and dry. No rash noted. He is not diaphoretic. No pallor.   Psychiatric: He has a normal mood and affect. His behavior is normal. Judgment and thought content normal.       Assessment:      Elbow pain, right  R elbow pain when leaning on the elbow.  Get xrays. nabumatone bid and re eval in one month.    COPD (chronic obstructive pulmonary disease)  Stable, refill inhalers.    Pyuria  Previous urine culture.    Hematuria  Will refer when ins in force.    Diabetes mellitus  AM bs around 90-100.  Needs meds refill.  Plan:

## 2010-08-20 NOTE — Assessment & Plan Note (Signed)
AM bs around 90-100.  Needs meds refill.

## 2010-08-22 MED ORDER — HYDROCODONE-ACETAMINOPHEN 7.5-750 MG PO TABS
ORAL_TABLET | Freq: Four times a day (QID) | ORAL | Status: DC | PRN
Start: 2010-08-22 — End: 2010-10-17

## 2010-08-22 NOTE — Progress Notes (Signed)
This office note has been dictated.

## 2010-09-05 LAB — POCT GLUCOSE
POC Glucose: 122 mg/dl — ABNORMAL HIGH (ref 70–99)
POC Glucose: 83 mg/dl (ref 70–99)

## 2010-09-05 NOTE — Op Note (Unsigned)
PATIENT NAME:                 PA #:            MR #[name], Peck                [id number]       [id number]            SURGEON:                              SURG DATE:  DIS DATE:          [name] Bill Diezel Mazur, MD                   09/05/2010                     DATE OF BIRTH:   AGE:           PATIENT TYPE:     RM #:              11-14-1955       55             [facility]                                     PREOPERATIVE DIAGNOSIS:  Degenerative disk disease lumbar spine.     POSTOPERATIVE DIAGNOSIS:  Degenerative disk disease lumbar spine.     PROCEDURE:    1.  Right L4 selective epidural steroid injection.  2.  Right S1 selective epidural steroid injection.  3.  Left L4 selective epidural steroid injection.  4.  Left S1 selective epidural steroid injection.     CHIEF COMPLAINT:  Low back and bilateral leg pain.     HISTORY OF THE PRESENT ILLNESS:  The patient is a 55 year old male who  presents to same day surgery at [facility] because of low back  and bilateral leg pain.  He has pain in the back, pain down both legs.  He  has got pain when he stands and when he walks.  Less pain when he sits.  Less  pain when he lies down.  He does not have any neurogenic bowel or bladder.   Because of patients pain, the patient will undergo therapeutic spinal  injections today.     ALLERGIES:  PENICILLIN.      PROCEDURE NOTE:  The patient lay prone on the radiology table. The lower  lumbar and upper sacral area was cleaned with topical Betadine and sterile  drape to the sterile area.  One percent lidocaine was used as a Photographer.  The patient was anesthetized by anesthesiology and properly  monitored.  MAC anesthesia was used by anesthesiology at the patients request  because of anxiety and patients poor tolerance to pain.  A 22-gauge 3-/2 inch  spinal needle was inserted underneath the right L4 pedicle in the spinal  canal.  Aspiration was performed.  No blood or CSF fluid was aspirated.   Isovue was  injected in the spinal canal.  Extradural myelogram was obtained.   Once a right L4 neurogram was seen a solution consisting of 1 mL of  Betamethasone and 2% lidocaine were injected and  2% lidocaine were injected.   When this was done a 22-gauge 3-1/2 inch spinal needle was inserted  underneath the left L4 pedicle in the spinal canal. Aspiration was performed.   No blood or CSF fluid was aspirated.  Isovue was injected in the spinal canal and extradural myelogram was  obtained.  Once the left L4 neurogram was seen a solution consisting of 1 mL  of Betamethasone and 2% lidocaine were injected. When this was done, a  22-gauge 3-1/2 inch spinal needle was inserted through the left S1 posterior  neuroforamen in the spinal canal.  Aspiration was performed.  No blood or CSF  fluid was aspirated.  Isovue was injected in the spinal canal and extradural  myelogram was obtained.  Once the left S1 neurogram was seen a solution  consisting of 1 mL of Betamethasone and 2% lidocaine were injected.  When  this was done a 22-gauge 3-1/2 inch spinal needle was inserted through the  right S1 posterior neuroforamen in the spinal canal.  Aspiration was  performed.  No blood or CSF fluid was aspirated.  Isovue was injected in the  spinal canal and extradural myelogram was obtained.  Once a right S1  neurogram was seen a solution consisting of 1 mL of Betamethasone and 2%  lidocaine were injected. The patient tolerated all four procedures well.  The  patient will be observed for about a half an hour, suffers no side effect,  discharge home.                                          [name] Herrlich, MD     [id number]  DD: 09/05/2010 06:51   DT: 09/05/2010 10:51   Job #: [id number]  CC: Romie Levee, MD  CC: [name] Bill Cathleen Yagi, MD

## 2010-09-13 NOTE — Progress Notes (Signed)
HISTORY OF PRESENT ILLNESS:  [name] Peck presents today for evaluation of low back pain.  He has had pain in his back.  He states he is taking his hydrocodone.  He says his pain is about 7/10.  He has less pain when he lies down.  He does not have any neurogenic bowel or bladder.  No fever, chills at nighttime.  Standing and walking do cause him to have pain.  He has less pain when he lies down.  His urine tox screen did not show the presence of the hydrocodone.  He has been given prescriptions for hydrocodone for the last several months.     PHYSICAL EXAMINATION:  On physical exam today, straight leg raise is negative. Deep tendon reflexes are 1/4 at the knees and ankles.  Pinprick sensation and muscle strength seems to be good.      IMPRESSION:  1. Degenerative disease of the lumbar spine.   2. Chronic neuropathic pain.      PLAN:  The fact that the patient's urine drug screen did not show the presence of the hydrocodone was discussed with him.  He states he does take his medication.  At this point in time, he needs to have a urine tox screen today.  After he has his urine tox screen today, we will renew his hydrocodone.  He was informed that if it comes up negative one more time for hydrocodone, then he will need to be discharged from this office and he will no longer be getting the medication from me.        [name] Bill ZEFF, MD

## 2010-09-20 NOTE — Telephone Encounter (Signed)
Patient states he had a family emergency, he had to go to [facility] with his grandchild. He wants refills Vicoden 750 ES. He says he can't wait til the next soonest appt. Please advise [phone]

## 2010-09-20 NOTE — Telephone Encounter (Signed)
Patient stated that he has had 3 urine drug screens, I only see two one from 06/08 from [facility] and one from 06/28 per [facility] he will only fill tramadol until his next appt which he set for 09/19 which is the earliest appt. He was not happy with this and wanted to know the reason as to why he is not getting the vicodin I explained to him that his urine drug screen had come back negative and that is why and that he has also signed a medication contract that clearly states that he will only get his prescriptions filled in the office on the date of his appointments. He wants a call from Dr. Payton Spark only. I told him he was seeing patient and that I could not guarantee a call back from him today.

## 2010-09-25 NOTE — Communication Body (Signed)
Assessment:      Plan:  HISTORY OF PRESENT ILLNESS:  [name] presents today for evaluation of low back pain.  He has had pain in his back.  He states he is taking his hydrocodone.  He says his pain is about 7/10.  He has less pain when he lies down.  He does not have any neurogenic bowel or bladder.  No fever, chills at nighttime.  Standing and walking do cause him to have pain.  He has less pain when he lies down.  His urine tox screen did not show the presence of the hydrocodone.  He has been given prescriptions for hydrocodone for the last several months.     PHYSICAL EXAMINATION:  On physical exam today, straight leg raise is negative. Deep tendon reflexes are 1/4 at the knees and ankles.  Pinprick sensation and muscle strength seems to be good.      IMPRESSION:  1. Degenerative disease of the lumbar spine.   2. Chronic neuropathic pain.      PLAN:  The fact that the patient's urine drug screen did not show the presence of the hydrocodone was discussed with him.  He states he does take his medication.  At this point in time, he needs to have a urine tox screen today.  After he has his urine tox screen today, we will renew his hydrocodone.  He was informed that if it comes up negative one more time for hydrocodone, then he will need to be discharged from this office and he will no longer be getting the medication from me.        Regis BillLAWRENCE A ZEFF, MD

## 2010-10-17 MED ORDER — HYDROCHLOROTHIAZIDE 25 MG PO TABS
25 MG | ORAL_TABLET | Freq: Every day | ORAL | Status: DC
Start: 2010-10-17 — End: 2010-10-22

## 2010-10-17 MED ORDER — HYDROCODONE-ACETAMINOPHEN 7.5-750 MG PO TABS
ORAL_TABLET | Freq: Four times a day (QID) | ORAL | Status: DC | PRN
Start: 2010-10-17 — End: 2010-11-14

## 2010-10-17 NOTE — Progress Notes (Signed)
This office note has been dictated.

## 2010-10-17 NOTE — H&P (Signed)
HISTORY OF PRESENT ILLNESS:  [name] presents for evaluation of low back,  right leg pain.  He has pain in the back, pain down the lower leg.  He has  pain when he stands and when he walks.  Less pain when he sits, less pain  when he lies down.  He does not have any neurogenic bowel or bladder.  No  fever, chills.       PHYSICAL EXAMINATION:  Oriented to person, place and time.  Straight leg raise is negative.  Deep  tendon reflexes 1/4 in the knees and ankles.  Pinprick sensation is normal.   Muscle strength seems to be good.       IMPRESSION:  Degenerative disk disease of the lumbar spine.       PLAN:    1.  We will have the patient undergo right L4 and right S1 selective epidural  steroid injection.    2.  Renewed the patient's hydrocodone.   3.  See the patient back in the office after we do the epidural shot.          Regis Bill Doyne Micke, MD     [id number]  DD: 10/17/2010 14:18  DT: 10/17/2010 17:34  SSI File#: [id number]  Job #: [id number]  CC:

## 2010-10-22 NOTE — Progress Notes (Signed)
Subjective:      Patient ID: [name] Peck is a 55 y.o. male.    Hypertension  This is a chronic problem. The current episode started more than 1 year ago. The problem is controlled. Pertinent negatives include no anxiety, blurred vision, chest pain, headaches, malaise/fatigue, neck pain, orthopnea, palpitations, peripheral edema, PND, shortness of breath or sweats. There are no associated agents to hypertension. Risk factors for coronary artery disease include male gender, family history, sedentary lifestyle and smoking/tobacco exposure. Past treatments include diuretics. The current treatment provides mild improvement. There are no compliance problems.  There is no history of angina, kidney disease, CAD/MI, CVA, left ventricular hypertrophy, PVD, renovascular disease or retinopathy. There is no history of chronic renal disease, coarctation of the aorta, hyperaldosteronism, hypercortisolism, hyperparathyroidism, a hypertension causing med, pheochromocytoma or sleep apnea.       Review of Systems   Constitutional: Negative for fever, chills, malaise/fatigue, diaphoresis, activity change, appetite change, fatigue and unexpected weight change.   HENT: Negative for hearing loss, ear pain, nosebleeds, congestion, sore throat, facial swelling, sneezing, drooling, mouth sores, trouble swallowing, neck pain, neck stiffness, dental problem, voice change, postnasal drip, sinus pressure, tinnitus and ear discharge.    Eyes: Negative for blurred vision, photophobia, pain, discharge, redness, itching and visual disturbance.   Respiratory: Negative for apnea, cough, choking, chest tightness, shortness of breath and wheezing.    Cardiovascular: Negative for chest pain, palpitations, orthopnea, leg swelling and PND.   Gastrointestinal: Negative for nausea, vomiting, abdominal pain, constipation, blood in stool, anal bleeding and rectal pain.   Genitourinary: Negative for dysuria, urgency, frequency, hematuria, flank pain,  decreased urine volume, discharge, penile swelling, scrotal swelling, enuresis, difficulty urinating and testicular pain.   Musculoskeletal: Negative for myalgias, back pain, joint swelling, arthralgias and gait problem.   Skin: Negative for color change, pallor, rash and wound.   Neurological: Negative for dizziness, tremors, seizures, syncope, facial asymmetry, speech difficulty, weakness, light-headedness, numbness and headaches.   Hematological: Negative for adenopathy. Does not bruise/bleed easily.   Psychiatric/Behavioral: Negative for suicidal ideas, hallucinations, behavioral problems, confusion, sleep disturbance, self-injury, dysphoric mood, decreased concentration and agitation. The patient is not nervous/anxious and is not hyperactive.        Objective:   Physical Exam   Constitutional: He is oriented to person, place, and time. He appears well-developed and well-nourished. No distress.   HENT:   Head: Normocephalic and atraumatic.   Right Ear: External ear normal.   Left Ear: External ear normal.   Nose: Nose normal.   Mouth/Throat: Oropharynx is clear and moist. No oropharyngeal exudate.   Eyes: Conjunctivae and EOM are normal. Pupils are equal, round, and reactive to light. Right eye exhibits no discharge. Left eye exhibits no discharge. No scleral icterus.   Neck: Normal range of motion. Neck supple. No JVD present. No tracheal deviation present. No thyromegaly present.   Cardiovascular: Normal rate, regular rhythm, normal heart sounds and intact distal pulses.  Exam reveals no friction rub.    No murmur heard.  Pulses:       Carotid pulses are 2+ on the right side, and 2+ on the left side.       Radial pulses are 2+ on the right side, and 2+ on the left side.        Femoral pulses are 2+ on the right side, and 2+ on the left side.       Popliteal pulses are 2+ on the right side, and 2+ on the  left side.        Dorsalis pedis pulses are 2+ on the right side, and 2+ on the left side.        Posterior  tibial pulses are 2+ on the right side, and 2+ on the left side.   Pulmonary/Chest: Effort normal and breath sounds normal. No stridor. No respiratory distress. He has no wheezes. He has no rales. He exhibits no tenderness.   Abdominal: Soft. Bowel sounds are normal. He exhibits no distension and no mass. There is no tenderness. There is no rebound and no guarding.   Musculoskeletal: Normal range of motion. He exhibits no edema and no tenderness.   Lymphadenopathy:     He has no cervical adenopathy.   Neurological: He is oriented to person, place, and time. He displays normal reflexes. No cranial nerve deficit. He exhibits normal muscle tone. Coordination normal.   Skin: Skin is warm and dry. No rash noted. He is not diaphoretic. No pallor.   Psychiatric: He has a normal mood and affect. His behavior is normal. Judgment and thought content normal.       Assessment:      Hypertension  Sl increased bp only on hctz 25mg  qd, change back to lisinopril/hctz combination and re eval in 2 months.    Callus of foot  Callus , pre ulcerative lesion, corn left little toe. Needs podiatry referral.        Smoker, down to  3 cigarettes, lliterature given.    Says flu vac gave him the flu when he last took it.        Plan:

## 2010-10-22 NOTE — Assessment & Plan Note (Signed)
Callus , pre ulcerative lesion, corn left little toe. Needs podiatry referral.

## 2010-10-22 NOTE — Telephone Encounter (Signed)
Started precert for [facility] w/ [facility]

## 2010-10-22 NOTE — Assessment & Plan Note (Signed)
Sl increased bp only on hctz 25mg  qd, change back to lisinopril/hctz combination and re eval in 2 months.

## 2010-10-23 NOTE — Telephone Encounter (Signed)
Sherri, need your help please. i [facility] Received a call today from KapaaKaren @ [phone]. She is needing this patients MRI of lumbar (can not find anywhere in system) and needing to know if patient has tried and failed any NSAIDS at all. There is not any documentation of this at all. Could you please advise me or call karen and let her know. Thank you!

## 2010-10-23 NOTE — Telephone Encounter (Signed)
Called [name] and all he had was a cervical and thoracic mri not a lumbar mri.  Told her that he originally saw [place] for his back pain and then when he came in for his mri and it was on his neck and mid back he changed he pain area.  She will call the patient.  The injection will be denied.

## 2010-10-24 NOTE — Telephone Encounter (Signed)
[facility]. Informed pt.

## 2010-11-14 MED ORDER — HYDROCODONE-ACETAMINOPHEN 7.5-750 MG PO TABS
ORAL_TABLET | Freq: Four times a day (QID) | ORAL | Status: DC | PRN
Start: 2010-11-14 — End: 2011-06-06

## 2010-11-14 NOTE — Progress Notes (Signed)
HISTORY OF PRESENT ILLNESS:  [name] presents for evaluation of low back  and right leg pain.  He has pain in the back and pain down the right leg.  He  has pain when he stands and when he walks.  He has less pain when he lies  down.  He does not have any neurogenic bowel or bladder.  No fevers or chills  at nighttime.  He says he has some pain when he stands and when he walks.  He  says the oxycodone does help relieve his pain.  We have not been able to have  epidural steroid injections because it has been denied by his insurance.     PHYSICAL EXAMINATION:  He is oriented to person, place and time.  Straight  leg raise is causative for low back pain.  Deep tendon reflexes are 1/4 at  the knees and the ankles.  Pinprick sensation is normal.  Muscle strength is  good.      IMPRESSION:  1.  Degenerative disk disease of the lumbar spine.  2.  Chronic neuropathic pain.     PLAN:  1.  Renew the patient's oxycodone.  2.  Have the patient start physical therapy.  3.  See him back in the office in about a month.        [name] Garron A [name], MD     [id number]  DD: 11/14/2010 13:50  DT: 11/15/2010 07:26  SSI File#: [id number]  Job #: [id number]  CC:

## 2010-11-14 NOTE — Progress Notes (Signed)
This office note has been dictated.

## 2010-11-21 NOTE — Communication Body (Signed)
HISTORY OF PRESENT ILLNESS:  Mr. Providence LaniusHowell presents for evaluation of low back  and right leg pain.  He has pain in the back and pain down the right leg.  He  has pain when he stands and when he walks.  He has less pain when he lies  down.  He does not have any neurogenic bowel or bladder.  No fevers or chills  at nighttime.  He says he has some pain when he stands and when he walks.  He  says the oxycodone does help relieve his pain.  We have not been able to have  epidural steroid injections because it has been denied by his insurance.     PHYSICAL EXAMINATION:  He is oriented to person, place and time.  Straight  leg raise is causative for low back pain.  Deep tendon reflexes are 1/4 at  the knees and the ankles.  Pinprick sensation is normal.  Muscle strength is  good.      IMPRESSION:  1.  Degenerative disk disease of the lumbar spine.  2.  Chronic neuropathic pain.     PLAN:  1.  Renew the patient's oxycodone.  2.  Have the patient start physical therapy.  3.  See him back in the office in about a month.        Regis BillLAWRENCE A ZEFF, MD     [id number]  DD: 11/14/2010 13:50  DT: 11/15/2010 07:26  SSI File#: [id number]  Job #: [id number]  CC:

## 2010-12-26 ENCOUNTER — Inpatient Hospital Stay: Admit: 2010-12-26 | Discharge: 2010-12-26 | Disposition: A | Discharge status: Home / Self Care | Payer: MEDICAID

## 2010-12-26 DIAGNOSIS — M549 Dorsalgia, unspecified: Secondary | ICD-10-CM

## 2010-12-26 MED ORDER — traMADol (ULTRAM) 50 mg tablet
50 | ORAL_TABLET | Freq: Four times a day (QID) | ORAL | Status: AC | PRN
Start: 2010-12-26 — End: 2013-11-08

## 2010-12-26 NOTE — Unmapped (Signed)
ED Screening Protocol - Yes

## 2010-12-26 NOTE — Unmapped (Signed)
Tripped and fell down 10 steps about 1 hour ago. -LOC -N/V.  C/o back pain

## 2010-12-26 NOTE — Unmapped (Signed)
[facility] ED Note    Reason for Visit: Back Pain      Patient History     HPI:  [name] is a 55 y.o. male who presents with back pain that has been going on for years.  The patient got out of prison today and reports that he has had pain constantly while he was locked up because he is not allowed to have his pain medications.  He denies any new numbness, weakness, or tingling.  He previously followed quite religiously with a pain doctor, but was unable to keep his appointments while he was locked up.  He denies any associated fevers.       Past Medical History   Diagnosis Date   ??? Hypertension        No past surgical history on file.    [name]  does not have a smoking history on file. He does not have any smokeless tobacco history on file. He reports that he does not use illicit drugs. His alcohol history not on file.    Previous Medications    HYDROCHLOROTHIAZIDE (HYDRODIURIL) 25 MG TABLET    Take 25 mg by mouth daily.       Allergies:   Allergies as of 12/26/2010   ??? (Not on File)       Review of Systems     ROS: Positive as above. All other systems are reviewed and are negative.       Physical Exam     ED Triage Vitals   Vital Signs Group      Temp 12/26/10 1233 97.9 ??F (36.6 ??C)      Temp Source 12/26/10 1233 Oral      Heart Rate 12/26/10 1233 90       Heart Rate Source 12/26/10 1233 Automatic      Resp 12/26/10 1233 18       BP 12/26/10 1233 145/83 mmHg      BP Location 12/26/10 1233 Left arm      BP Method 12/26/10 1233 Automatic      Patient Position 12/26/10 1233 Sitting   SpO2 12/26/10 1233 97 %   O2 Device 12/26/10 1233 None (Room air)       General: Healthy, well-appearing male in no distress.     HEENT: Pupils are equally round and briskly reactive to light.  Extraocular muscles are intact.  Oral mucous membranes are moist without lesions.  There is no evidence of contusion, laceration, or abrasion about the head or neck.    Neck:  No  JVD or adenopathy is noted.  The patient has full range of motion without difficulty.    Pulmonary:  Lungs are clear to auscultation bilaterally.  There are no rhonchi rales or wheezes.  There is no subcutaneous air or crepitus.  The chest wall is nontender.    Cardiac:  Regular rate and rhythm.  No murmurs, rubs, or gallops.  Distal pulses are brisk.    Abdomen:  Abdomen is soft, nontender, nondistended.  Bowel sounds are positive.  No organomegaly or masses are appreciated.    Musculoskeletal:  There is no long bone tenderness or deformities noted.  There is no peripheral edema.  No ecchymosis or rashes are noted over the extremities.  There is no tenderness over the midline of the thoracic or lumbar spine.    Vascular:  Pulses are brisk and equal in all 4 extremities.     Skin: No rashes are noted. Skin  is warm, dry, and without lesions    Neuro:  The patient has intact strength in all 4 extremities.  There are no deficits of the cranial nerves.  Gait is normal without assistance.  The visual fields are intact by direct challenge. There is no evidence of ataxia or dysmetria on exam. Speech is fluent without dysarthria.    Psych: Mental status and interactions are normal.      Diagnostic Studies       Emergency Department Procedures         ED Course and MDM     [name] is a 55 y.o. male who presented to the emergency department with Back Pain   that is chronic in nature.  He has a normal neurologic exam today.  There are no new or acute symptoms here.  He does report that he had an appointment made for December 27th which is about one month from today, at which he will be able to follow up with his pain specialist.  Pending that point time he'll be maintained on NSAIDs and a short course of Ultram.      Critical Care Time (Attendings)         Claudette Stapler, MD  12/26/10 [phone]

## 2010-12-26 NOTE — Telephone Encounter (Signed)
Pt has app on 12/27.  Pt called in said he is at [facility] right now because of back pain.  He said they want to give him tramadol but he cant take it because it makes him sick.  He wants to know if he can come see [facility] today.  He said he is leaving [facility] hospital now because they wont help him. He said he has been in jail in [place] for unpaid tickets and that's why he missed last app. Told pt over and over that 12/27 was first app.  I told him I could put message in to see if could see him sooner.  He said he doesn't have a phone to call him back at.  He wanted to speak to a nurse.  I kept telling him that they are with patients.    Spoke to [employer] she said pt will have to wait till his app.    Pt said he will go to [facility] then.

## 2010-12-26 NOTE — Telephone Encounter (Signed)
error 

## 2011-03-06 ENCOUNTER — Observation Stay (HOSPITAL_COMMUNITY)
Admission: EM | Admit: 2011-03-06 | Discharge: 2011-03-07 | Disposition: A | Discharge status: Home / Self Care | Payer: BC Managed Care – PPO | Attending: Internal Medicine | Admitting: Internal Medicine

## 2011-03-06 ENCOUNTER — Other Ambulatory Visit: Payer: Self-pay

## 2011-03-06 ENCOUNTER — Encounter (HOSPITAL_COMMUNITY): Payer: Self-pay

## 2011-03-06 ENCOUNTER — Emergency Department (HOSPITAL_COMMUNITY): Payer: BC Managed Care – PPO

## 2011-03-06 DIAGNOSIS — F419 Anxiety disorder, unspecified: Secondary | ICD-10-CM | POA: Diagnosis present

## 2011-03-06 DIAGNOSIS — R51 Headache: Secondary | ICD-10-CM | POA: Insufficient documentation

## 2011-03-06 DIAGNOSIS — E669 Obesity, unspecified: Secondary | ICD-10-CM | POA: Diagnosis present

## 2011-03-06 DIAGNOSIS — F17200 Nicotine dependence, unspecified, uncomplicated: Secondary | ICD-10-CM | POA: Insufficient documentation

## 2011-03-06 DIAGNOSIS — R0789 Other chest pain: Principal | ICD-10-CM | POA: Insufficient documentation

## 2011-03-06 DIAGNOSIS — R7303 Prediabetes: Secondary | ICD-10-CM | POA: Diagnosis present

## 2011-03-06 DIAGNOSIS — Z720 Tobacco use: Secondary | ICD-10-CM | POA: Diagnosis present

## 2011-03-06 DIAGNOSIS — E119 Type 2 diabetes mellitus without complications: Secondary | ICD-10-CM | POA: Insufficient documentation

## 2011-03-06 DIAGNOSIS — J4489 Other specified chronic obstructive pulmonary disease: Secondary | ICD-10-CM | POA: Insufficient documentation

## 2011-03-06 DIAGNOSIS — J449 Chronic obstructive pulmonary disease, unspecified: Secondary | ICD-10-CM | POA: Insufficient documentation

## 2011-03-06 DIAGNOSIS — R079 Chest pain, unspecified: Secondary | ICD-10-CM | POA: Diagnosis present

## 2011-03-06 DIAGNOSIS — F411 Generalized anxiety disorder: Secondary | ICD-10-CM | POA: Insufficient documentation

## 2011-03-06 HISTORY — DX: Essential (primary) hypertension: I10

## 2011-03-06 LAB — CBC
HCT: 46 % (ref 39.0–52.0)
Hemoglobin: 15.6 g/dL (ref 13.0–17.0)
MCH: 32.8 pg (ref 26.0–34.0)
MCHC: 33.9 g/dL (ref 30.0–36.0)
MCV: 96.8 fL (ref 78.0–100.0)
Platelets: 203 K/uL (ref 150–400)
RBC: 4.75 MIL/uL (ref 4.22–5.81)
RDW: 13.6 % (ref 11.5–15.5)
WBC: 5.3 K/uL (ref 4.0–10.5)

## 2011-03-06 LAB — CARDIAC PANEL(CRET KIN+CKTOT+MB+TROPI)
CK, MB: 2.1 ng/mL (ref 0.3–4.0)
Relative Index: INVALID (ref 0.0–2.5)
Total CK: 42 U/L (ref 7–232)
Troponin I: <0.3 ng/mL (ref <0.30)

## 2011-03-06 LAB — BASIC METABOLIC PANEL
Calcium: 9.4 mg/dL (ref 8.4–10.5)
Chloride: 104 mEq/L (ref 96–112)
Creatinine, Ser: 0.99 mg/dL (ref 0.50–1.35)
GFR calc Af Amer: >90 mL/min (ref >90)
GFR calc non Af Amer: >90 mL/min (ref >90)
Glucose, Bld: 97 mg/dL (ref 70–99)
Sodium: 140 mEq/L (ref 135–145)

## 2011-03-06 LAB — LIPID PANEL
LDL Cholesterol: 46 mg/dL (ref 0–99)
VLDL: 59 mg/dL — ABNORMAL HIGH (ref 0–40)

## 2011-03-06 LAB — TROPONIN I: Troponin I: <0.3 ng/mL (ref <0.30)

## 2011-03-06 MED ORDER — KETOROLAC TROMETHAMINE 30 MG/ML IJ SOLN: 30.0000 mg | Freq: Four times a day (QID) | INTRAMUSCULAR | Status: DC | PRN

## 2011-03-06 MED ORDER — ADULT MULTIVITAMIN W/MINERALS CH
1.0000 | ORAL_TABLET | Freq: Every day | ORAL | Status: DC
  Administered 2011-03-06 – 2011-03-07 (×2): 1 via ORAL
  Filled 2011-03-06 (×2): qty 1

## 2011-03-06 MED ORDER — LORAZEPAM 1 MG PO TABS
1.0000 mg | ORAL_TABLET | Freq: Two times a day (BID) | ORAL | Status: DC | PRN
  Administered 2011-03-07: 1 mg via ORAL
  Filled 2011-03-06: qty 1

## 2011-03-06 MED ORDER — ENOXAPARIN SODIUM 40 MG/0.4ML ~~LOC~~ SOLN
40.0000 mg | SUBCUTANEOUS | Status: DC
  Administered 2011-03-06: 40 mg via SUBCUTANEOUS
  Filled 2011-03-06: qty 0.4

## 2011-03-06 MED ORDER — ACETAMINOPHEN 650 MG RE SUPP: 650.0000 mg | Freq: Four times a day (QID) | RECTAL | Status: DC | PRN

## 2011-03-06 MED ORDER — NICOTINE 14 MG/24HR TD PT24
14.0000 mg | MEDICATED_PATCH | Freq: Once | TRANSDERMAL | Status: AC
  Administered 2011-03-06: 14 mg via TRANSDERMAL
  Filled 2011-03-06 (×2): qty 1

## 2011-03-06 MED ORDER — ASPIRIN 325 MG PO TABS
325.0000 mg | ORAL_TABLET | Freq: Every day | ORAL | Status: DC
  Administered 2011-03-06 – 2011-03-07 (×2): 325 mg via ORAL
  Filled 2011-03-06 (×2): qty 1

## 2011-03-06 MED ORDER — ONDANSETRON HCL 4 MG/2ML IJ SOLN: 4.0000 mg | Freq: Four times a day (QID) | INTRAMUSCULAR | Status: DC | PRN

## 2011-03-06 MED ORDER — LORAZEPAM 1 MG PO TABS
1.0000 mg | ORAL_TABLET | Freq: Once | ORAL | Status: AC
  Administered 2011-03-06: 1 mg via ORAL
  Filled 2011-03-06: qty 1

## 2011-03-06 MED ORDER — ALBUTEROL SULFATE HFA 108 (90 BASE) MCG/ACT IN AERS
2.0000 | INHALATION_SPRAY | Freq: Four times a day (QID) | RESPIRATORY_TRACT | Status: DC | PRN
  Administered 2011-03-07 (×2): 2 via RESPIRATORY_TRACT
  Filled 2011-03-06: qty 6.7

## 2011-03-06 MED ORDER — NICOTINE 14 MG/24HR TD PT24
14.0000 mg | MEDICATED_PATCH | Freq: Every day | TRANSDERMAL | Status: DC
  Administered 2011-03-07: 14 mg via TRANSDERMAL

## 2011-03-06 MED ORDER — ACETAMINOPHEN 325 MG PO TABS: 650.0000 mg | ORAL_TABLET | Freq: Four times a day (QID) | ORAL | Status: DC | PRN

## 2011-03-06 MED ORDER — SODIUM CHLORIDE 0.9 % IJ SOLN
3.0000 mL | Freq: Two times a day (BID) | INTRAMUSCULAR | Status: DC
  Administered 2011-03-06 – 2011-03-07 (×2): 3 mL via INTRAVENOUS
  Filled 2011-03-06 (×2): qty 3

## 2011-03-06 MED ORDER — ONDANSETRON HCL 4 MG PO TABS: 4.0000 mg | ORAL_TABLET | Freq: Four times a day (QID) | ORAL | Status: DC | PRN

## 2011-03-06 NOTE — H&P (Signed)
PCP:   Colette Ribas, MD, MD   Chief Complaint:  Chest pain  HPI: This is a 56 y/o male with history of pre-diabetes, tobacco abuse, obesity and elevated blood pressure.  Patient is not on any chronic medications.  He reports experiencing substernal chest pain that he describes as a pressure that has been intermittent for the past week.  He says the pain comes on when he is in certain positions.  It is substernal and is non radiating.  He has some associated shortness of breath, but attributes that to COPD. His wife reports that she has been checking his blood pressure at home for the last few days and has found it to be elevated.  This morning, he was lightheaded while coming out of the shower.  At that time his blood pressure was 180/100.  They went to his primary care doctor who subsequently referred him to the emergency room.   Allergies:  No Known Allergies    Past Medical History  Diagnosis Date  . Diabetes mellitus   . Hypertension     History reviewed. No pertinent past surgical history.  Prior to Admission medications   Medication Sig Start Date End Date Taking? Authorizing Provider  Multiple Vitamin (MULITIVITAMIN WITH MINERALS) TABS Take 1 tablet by mouth daily.   Yes Historical Provider, MD    Social History:  reports that he has been smoking.  He does not have any smokeless tobacco history on file. He reports that he does not drink alcohol or use illicit drugs.  No family history on file.  Review of Systems: Positives in bold Constitutional: Denies fever, chills, diaphoresis, appetite change and fatigue.  HEENT: Denies photophobia, eye pain, redness, hearing loss, ear pain, congestion, sore throat, rhinorrhea, sneezing, mouth sores, trouble swallowing, neck pain, neck stiffness and tinnitus.   Respiratory: Denies SOB, DOE, cough, chest tightness,  and wheezing.   Cardiovascular: Denies chest pain, palpitations and leg swelling.  Gastrointestinal: Denies nausea,  vomiting, abdominal pain, diarrhea, constipation, blood in stool and abdominal distention.  Genitourinary: Denies dysuria, urgency, frequency, hematuria, flank pain and difficulty urinating.  Musculoskeletal: Denies myalgias, back pain, joint swelling, arthralgias and gait problem.  Skin: Denies pallor, rash and wound.  Neurological: Denies dizziness, seizures, syncope, weakness, light-headedness, numbness and headaches.  Hematological: Denies adenopathy. Easy bruising, personal or family bleeding history  Psychiatric/Behavioral: Denies suicidal ideation, mood changes, confusion, nervousness, sleep disturbance and agitation   Physical Exam: Blood pressure 146/88, pulse 65, temperature 97.7 F (36.5 C), temperature source Oral, resp. rate 20, height 6\' 2"  (1.88 m), weight 106.142 kg (234 lb), SpO2 96.00%. General: Patient is lying in bed, in no acute distress, alert and oriented x3 HEENT: Normocephalic, atraumatic, pupils are equal round and reactive to light, mucous membranes are moist Neck: Supple Clear chest: Clear to auscultation bilaterally Cardiac: S1, S2, regular rate and rhythm Abdomen: Soft, nontender, nondistended, bowel sounds are active Extremities: No cyanosis, clubbing, edema Neurologic: Grossly intact, nonfocal Skin: Warm, intact, no visible rashes  Labs on Admission:  Results for orders placed during the hospital encounter of 03/06/11 (from the past 48 hour(s))  CBC     Status: Normal   Collection Time   03/06/11  8:12 AM      Component Value Range Comment   WBC 5.3  4.0 - 10.5 (K/uL)    RBC 4.75  4.22 - 5.81 (MIL/uL)    Hemoglobin 15.6  13.0 - 17.0 (g/dL)    HCT 16.1  09.6 - 04.5 (%)  MCV 96.8  78.0 - 100.0 (fL)    MCH 32.8  26.0 - 34.0 (pg)    MCHC 33.9  30.0 - 36.0 (g/dL)    RDW 16.1  09.6 - 04.5 (%)    Platelets 203  150 - 400 (K/uL)   BASIC METABOLIC PANEL     Status: Normal   Collection Time   03/06/11  8:12 AM      Component Value Range Comment   Sodium  140  135 - 145 (mEq/L)    Potassium 4.1  3.5 - 5.1 (mEq/L)    Chloride 104  96 - 112 (mEq/L)    CO2 29  19 - 32 (mEq/L)    Glucose, Bld 97  70 - 99 (mg/dL)    BUN 13  6 - 23 (mg/dL)    Creatinine, Ser 4.09  0.50 - 1.35 (mg/dL)    Calcium 9.4  8.4 - 10.5 (mg/dL)    GFR calc non Af Amer >90  >90 (mL/min)    GFR calc Af Amer >90  >90 (mL/min)   TROPONIN I     Status: Normal   Collection Time   03/06/11  8:12 AM      Component Value Range Comment   Troponin I <0.30  <0.30 (ng/mL)     Radiological Exams on Admission: Dg Chest 2 View  03/06/2011  *RADIOLOGY REPORT*  Clinical Data: Chest pain.  Elevated blood pressure.  CHEST - 2 VIEW  Comparison: CT chest 02/16/2003 and plain films of the chest 11/19/2007.  Findings: The chest is hyperexpanded and there is attenuation of the pulmonary vasculature.  Lungs are clear.  Heart size is normal. No pneumothorax or effusion.  IMPRESSION: COPD.  No acute finding.  Original Report Authenticated By: Bernadene Bell. Maricela Curet, M.D.    Assessment/Plan Principal Problem:  *Chest pain, appears somewhat atypical.  Since patient has risk factors, we will cycle his cardiac markers and monitor him on telemetry,  We will check 2D echo.  To risk stratify him, we will check lipids, hgba1c, we will also monitor blood pressure.  We will ask for cardiology consultation, as patient may need stress test (inpatient vs. Outpatient), start him on aspirin for now. Active Problems:  Anxiety, prn xanax, may be playing a role with chest pain.  Tobacco abuse, counseled, providing nicotine patch  Obesity (BMI 30-39.9)  Pre-diabetes, check hemoglobin a1c, fasting blood sugar COPD, will provide albuterol inhaler  Once cardiac work up complete, patient can be discharged home.  Time Spent on Admission:  Shalice Woodring [facility] Pager: [phone] 03/06/2011, 5:39 PM

## 2011-03-06 NOTE — ED Notes (Signed)
Family at bedside. Patient is ready to be discharged states if he doesn't get checked out in ten minutes he will leave AMA. RN Hospital doctor made aware.

## 2011-03-06 NOTE — ED Notes (Signed)
Called, gave report to Jessica RN 

## 2011-03-06 NOTE — ED Provider Notes (Signed)
History     CSN: [id number]  Arrival date & time 03/06/11  0740   First MD Initiated Contact with Patient 03/06/11 [phone]      Chief Complaint  Patient presents with  . Chest Pain    (Consider location/radiation/quality/duration/timing/severity/associated sxs/prior treatment) HPI  Past Medical History  Diagnosis Date  . Diabetes mellitus   . Hypertension     History reviewed. No pertinent past surgical history.  No family history on file.  History  Substance Use Topics  . Smoking status: Current Everyday Smoker  . Smokeless tobacco: Not on file  . Alcohol Use: No      Review of Systems  Constitutional: Negative.   Eyes: Negative.   Respiratory: Negative.   Cardiovascular: Positive for chest pain. Negative for palpitations and leg swelling.  Gastrointestinal: Negative.   Genitourinary: Negative.   Musculoskeletal: Negative.   Neurological: Positive for headaches.  Psychiatric/Behavioral: The patient is nervous/anxious.     Allergies  Review of patient's allergies indicates no known allergies.  Home Medications   Current Outpatient Rx  Name Route Sig Dispense Refill  . ADULT MULTIVITAMIN W/MINERALS CH Oral Take 1 tablet by mouth daily.      BP 157/96  Pulse 60  Temp(Src) 97.6 F (36.4 C) (Oral)  Resp 18  SpO2 99%  Physical Exam  Nursing note and vitals reviewed. Constitutional: He is oriented to person, place, and time. He appears well-developed and well-nourished.  Non-toxic appearance.  HENT:  Head: Normocephalic.  Right Ear: Tympanic membrane and external ear normal.  Left Ear: Tympanic membrane and external ear normal.  Eyes: EOM and lids are normal. Pupils are equal, round, and reactive to light.  Neck: Normal range of motion. Neck supple. Carotid bruit is not present.  Cardiovascular: Normal rate, regular rhythm, normal heart sounds, intact distal pulses and normal pulses.   Pulmonary/Chest: Breath sounds normal. No respiratory distress. He  has no wheezes. He has no rales. He exhibits tenderness.       Left chest wall pain to palpation.  Abdominal: Soft. Bowel sounds are normal. There is no tenderness. There is no guarding.  Musculoskeletal: Normal range of motion. He exhibits no edema and no tenderness.       Negative Homan's sign.  Lymphadenopathy:       Head (right side): No submandibular adenopathy present.       Head (left side): No submandibular adenopathy present.    He has no cervical adenopathy.  Neurological: He is alert and oriented to person, place, and time. He has normal strength. No cranial nerve deficit or sensory deficit.  Skin: Skin is warm and dry.  Psychiatric: His speech is normal. His mood appears anxious.    ED Course  Procedures (including critical care time)   Labs Reviewed  CBC  BASIC METABOLIC PANEL  TROPONIN I   Dg Chest 2 View  03/06/2011  *RADIOLOGY REPORT*  Clinical Data: Chest pain.  Elevated blood pressure.  CHEST - 2 VIEW  Comparison: CT chest 02/16/2003 and plain films of the chest 11/19/2007.  Findings: The chest is hyperexpanded and there is attenuation of the pulmonary vasculature.  Lungs are clear.  Heart size is normal. No pneumothorax or effusion.  IMPRESSION: COPD.  No acute finding.  Original Report Authenticated By: Bernadene Bell. Maricela Curet, M.D.     No diagnosis found.    MDM  I have reviewed nursing notes, vital signs, and all appropriate lab and imaging results for this patient. 9:35-no return of chest pain  or pressure. No nausea no vomiting. Patient speaking in complete sentences with his wife without complication. Would like to know how long the rest of the workup will take so he can go home. 10:15 - NO chest pain. Pt becoming a little anxious about finishing up and leaving. Pt initially wanted to leave AMA, but has decided to be admitted. Pt to be admitted by hospitalist.     Kathie Dike, PA 03/11/11 [phone]

## 2011-03-06 NOTE — ED Notes (Signed)
Pt reports seeing his pcp this a.m for chest pain.  Pt reports the Dr telling him that he needed to come here immediately.  Pt denies any sob, diaphoresis, n/v, or dizziness.  Pt reports pain to his left chest and reports "it feels like someone taking an anvil to my chest".  Pt alert and oriented, on monitor.

## 2011-03-06 NOTE — ED Notes (Signed)
Pt initially wanted to leave AMA, was agitated.  Pt reports "i hate hospitals".  Pt now requesting to follow PA's advice and be admitted.  Wife at bedside.  nad noted

## 2011-03-07 ENCOUNTER — Encounter (HOSPITAL_COMMUNITY): Payer: Self-pay | Admitting: Cardiology

## 2011-03-07 DIAGNOSIS — I517 Cardiomegaly: Secondary | ICD-10-CM

## 2011-03-07 DIAGNOSIS — R079 Chest pain, unspecified: Secondary | ICD-10-CM

## 2011-03-07 LAB — BASIC METABOLIC PANEL
BUN: 13 mg/dL (ref 6–23)
CO2: 27 mEq/L (ref 19–32)
Chloride: 106 mEq/L (ref 96–112)
Creatinine, Ser: 0.86 mg/dL (ref 0.50–1.35)
Glucose, Bld: 113 mg/dL — ABNORMAL HIGH (ref 70–99)
Potassium: 4 mEq/L (ref 3.5–5.1)

## 2011-03-07 LAB — CARDIAC PANEL(CRET KIN+CKTOT+MB+TROPI)
Relative Index: INVALID (ref 0.0–2.5)
Total CK: 81 U/L (ref 7–232)
Troponin I: <0.3 ng/mL (ref <0.30)

## 2011-03-07 LAB — CBC
HCT: 45.4 % (ref 39.0–52.0)
Hemoglobin: 14.4 g/dL (ref 13.0–17.0)
MCHC: 31.7 g/dL (ref 30.0–36.0)
MCV: 97.4 fL (ref 78.0–100.0)
RDW: 14 % (ref 11.5–15.5)

## 2011-03-07 LAB — HEMOGLOBIN A1C
Hgb A1c MFr Bld: 5.7 % — ABNORMAL HIGH (ref <5.7)
Mean Plasma Glucose: 117 mg/dL — ABNORMAL HIGH (ref <117)

## 2011-03-07 MED ORDER — ENALAPRIL MALEATE 5 MG PO TABS
2.5000 mg | ORAL_TABLET | Freq: Every day | ORAL | Status: DC
  Administered 2011-03-07: 2.5 mg via ORAL
  Filled 2011-03-07: qty 1

## 2011-03-07 MED ORDER — ENALAPRIL MALEATE 2.5 MG PO TABS: 2.5000 mg | ORAL_TABLET | Freq: Every day | ORAL | Status: DC

## 2011-03-07 NOTE — Progress Notes (Addendum)
IV removed, site WNL.  Pt given d/c instructions and new prescriptions. Teach back done on new medications and f/u appts and home diet. Discussed home care with patient and discussed home medications, patient verbalizes understanding. F/U appointments in place, pt states they will keep appointments. Pt is stable at this time. Pt refused wheelchair, escorted to main entrance by student nurse.

## 2011-03-07 NOTE — Consult Note (Signed)
CARDIOLOGY CONSULT NOTE  Patient ID: Daniel [name] MRN: [id number] DOB/AGE: 1955-03-30 56 y.o.  Admit date: 03/06/2011 Referring Physician: PTH Primary PhysicianGOLDING,Everhett CABOT, MD, MD Primary Cardiologist: Diona Browner Reason for Consultation: Chest Pain Principal Problem:  *Chest pain Active Problems:  Anxiety  Tobacco abuse  Obesity (BMI 30-39.9)  Pre-diabetes  HPI: Daniel [name] is a 56 year old male with no prior cardiac history with a history of newly diagnosed hypertension, who was admitted with chest pain. He is very anxious and hyper in actions and verbal responses. He states that he has been under a lot of stress with family and job pressures. 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 He, states he has been having some complaints of chest pressure, usually occurring with movement, sometimes it occurs when he lays down in the bed and he has to turn and be in another position to relieve it. He states this is been going on the last several weeks. The pain became worse and worrisome to him therefore he presented to the emergency room at his wife's insistence.and he was hypertensive with a blood pressure 157/96, heart rate was 60, respirations 18. Labs were completed, and he was found to have negative cardiac enzymes x3. Potassium level was normal, and chest x-ray was normal. EKG showed normal sinus rhythm with T-wave inversion in lead V1. History of breath, Ativan 1 mg orally and placed on a NicoDerm patch and admitted to rule out myocardial infarction. It appears that the patient's pain is usually related to movement and position. However, secondary to his risk factors to include hypertension, hypertriglyceridemia and smoking with prior history of alcohol abuse. We are asked to make further recommendations.  Review of systems complete and found to be negative unless listed above   Past Medical History  Diagnosis Date  . Diabetes mellitus   . Hypertension     No family history on file.  History   Social History  . Marital Status: Married    Spouse Name: N/A    Number of Children: N/A  . Years of Education: N/A   Occupational History  . Not on file.   Social History Main Topics  . Smoking status: Current Everyday Smoker    Types: Cigarettes  . Smokeless tobacco: Not on file  . Alcohol Use: No  . Drug Use: No  . Sexually Active: Not on file   Other Topics Concern  . Not on file   Social History Narrative  . No narrative on file    History reviewed. No pertinent past surgical history.   Prescriptions prior to admission  Medication Sig Dispense Refill  . Multiple Vitamin (MULITIVITAMIN WITH MINERALS) TABS Take 1 tablet by mouth daily.        Physical Exam: Blood pressure 149/94, pulse 67, temperature 97.7 F (36.5 C), temperature source Oral, resp. rate 20, height 6\' 2"  (1.88 m), weight 234 lb (106.142 kg), SpO2 98.00%.   General: Well developed, well nourished, in no acute distress Head: Eyes PERRLA, No xanthomas.   Normal cephalic and atramatic  Lungs: Clear bilaterally to auscultation and percussion. Heart: HRRR S1 S2, without MRG.  Pulses are 2+ & equal.            No carotid bruit. No JVD.  No abdominal bruits. No femoral bruits. Abdomen: Bowel sounds are positive, abdomen soft and non-tender without masses or  Hernia's noted. Msk:  Back normal, normal gait. Normal strength and tone for age. Extremities:  No clubbing, cyanosis or edema.  DP +1 Neuro: Alert and oriented X 3. Psych:  Good affect, responds appropriately  Labs:   Lab Results  Component Value Date   WBC 4.7 03/07/2011   HGB 14.4 03/07/2011   HCT 45.4 03/07/2011   MCV 97.4 03/07/2011   PLT 173 03/07/2011    Lab 03/07/11 0527  NA 139  K 4.0  CL 106  CO2 27  BUN 13  CREATININE 0.86  CALCIUM 9.4  PROT --  BILITOT --  ALKPHOS --  ALT --  AST --  GLUCOSE 113*   Lab Results  Component Value Date   CKTOTAL 81 03/07/2011   CKMB 2.0 03/07/2011   TROPONINI  <0.30 03/07/2011    Lab Results  Component Value Date   CHOL 143 03/06/2011   Lab Results  Component Value Date   HDL 38* 03/06/2011   Lab Results  Component Value Date   LDLCALC 46 03/06/2011   Lab Results  Component Value Date   TRIG 296* 03/06/2011   Lab Results  Component Value Date   CHOLHDL 3.8 03/06/2011   No results found for this basename: LDLDIRECT      Radiology: Dg Chest 2 View  03/06/2011  *RADIOLOGY REPORT*  Clinical Data: Chest pain.  Elevated blood pressure.  CHEST - 2 VIEW  Comparison: CT chest 02/16/2003 and plain films of the chest 11/19/2007.  Findings: The chest is hyperexpanded and there is attenuation of the pulmonary vasculature.  Lungs are clear.  Heart size is normal. No pneumothorax or effusion.  IMPRESSION: COPD.  No acute finding.  Original Report Authenticated By: Bernadene Bell. D'ALESSIO, M.D.   EKG:NSR with T-wave inversion in lead V1  ASSESSMENT AND PLAN:   1. Atypical chest pain, believed to be more related to musculoskeletal pain as this is reproducible with movement and position change. He does, however, have multiple cardiovascular risk factors. Our plan is to schedule him for an outpatient stress Myoview, which is to be completed in the a.m. at an ache in hospital. I discussed this with him and he is willing to proceed, so, that he may go home today. It is unlikely that he has now myocardial ischemia. However, stress test will confirm need for further evaluation. We will make a followup appointment after stress test for discussion of test results and further treatment. Should it be necessary.  2. Hypertension: Newly diagnosed. The patient has not been placed on any antihypertensives in the past and was noted to be hypertensive on admission. Would recommend low-dose ACE inhibitor as outpatient. Would not add beta blocker as his heart rate is in the 50s to 60s. At rest.  3. Tobacco abuse: Smoking cessation is strongly advised the patient states that he was able  to quit smoking cold [place] in the past and recently restarted. He, states he is under a lot of stress with work issues family issues and has been smoking more as a result. He has been placed on a NicoDerm patch here in the hospital and recommend he continue this use as an outpatient. If he is going to use it. He states that he wishes to stop cold [place] as he has in the past. We will leave that to primary care for continued evaluation and treatment. He is advised. This is the #1 cause of heart disease in men of his age. He verbalizes understanding. Signed: Bettey Mare. Lyman Bishop NP [facility] 03/07/2011, 12:23 PM Co-Sign MD   Attending note:  Patient seen and examined. Reviewed records as well as database as recorded by Ms. Lawrence. In summary he is a 56 year old male with recently diagnosed hypertension, under a lot of stress, describes himself as "type A," active smoker, with no personal history of CAD or myocardial infarction. He presents with atypical, intermittent chest pain symptoms over the last several weeks, positional in nature, not strictly exertional, no associated cough or hemoptysis.  On examination he is comfortable, afebrile, heart rate 67, blood pressure 149/94, oxygen saturation 98% on room air. He is overweight, lungs are clear and nonlabored, cardiac exam with regular rate and rhythm and no pericardial rub, no peripheral edema.  Laboratory data shows potassium 4.0, BUN 13, creatinine 0.8, troponin I less than 0.30x3, LDL 46, triglycerides 296, hemoglobin 14.4. Chest x-ray shows hyperinflation with no infiltrates. ECG is reviewed showing nonspecific ST segment changes.  We will review the echocardiogram that has already been ordered. If there is no significant left ventricular dysfunction or focal wall motion abnormalities, would anticipate outpatient followup for stress testing and additional risk stratification. Otherwise would recommend risk factor modification strategies,  smoking cessation.  Jonelle Sidle, M.D., F.A.C.C.

## 2011-03-07 NOTE — Discharge Summary (Signed)
Physician Discharge Summary  Patient ID: Daniel [name] MRN: [id number] DOB/AGE: 1955/02/10 56 y.o. Primary Care Physician:GOLDING,Niko CABOT, MD, MD Admit date: 03/06/2011 Discharge date: 03/07/2011    Discharge Diagnoses:  1. Atypical chest pain, no evidence of myocardial ischemia or infarction. 2. Ongoing tobacco abuse. 3. Obesity. 4. Elevated blood glucose fasting. 5. Anxiety.   Medication List  As of 03/07/2011  2:41 PM   TAKE these medications         enalapril 2.5 MG tablet   Commonly known as: VASOTEC   Take 1 tablet (2.5 mg total) by mouth daily.      mulitivitamin with minerals Tabs   Take 1 tablet by mouth daily.            Discharged Condition: Stable.    Consults: Cardiology, Dr. Diona Browner.  Significant Diagnostic Studies: Dg Chest 2 View  03/06/2011  *RADIOLOGY REPORT*  Clinical Data: Chest pain.  Elevated blood pressure.  CHEST - 2 VIEW  Comparison: CT chest 02/16/2003 and plain films of the chest 11/19/2007.  Findings: The chest is hyperexpanded and there is attenuation of the pulmonary vasculature.  Lungs are clear.  Heart size is normal. No pneumothorax or effusion.  IMPRESSION: COPD.  No acute finding.  Original Report Authenticated By: Bernadene Bell. Maricela Curet, M.D.    Lab Results: Basic Metabolic Panel:  Basename 03/07/11 0527 03/06/11 0812  NA 139 140  K 4.0 4.1  CL 106 104  CO2 27 29  GLUCOSE 113* 97  BUN 13 13  CREATININE 0.86 0.99  CALCIUM 9.4 9.4  MG -- --  PHOS -- --       CBC:  Basename 03/07/11 0527 03/06/11 0812  WBC 4.7 5.3  NEUTROABS -- --  HGB 14.4 15.6  HCT 45.4 46.0  MCV 97.4 96.8  PLT 173 203            2D Echocardiogram without contrast      Ordering Physician:  Erick Blinks, MD  Order# [id number]  Study Date:  03/07/11      Patient Information      Name  MRN   Description     DAMION KANT  [id number]   56 year old Male              Result Narrative     *[facility]* [address] [place], [place] [address] [phone]  ------------------------------------------------------------ Transthoracic Echocardiography  Patient: Daniel [name], Daniel [name] MR #: [id number] Study Date: 03/07/2011 Gender: M Age: 79 Height: 188cm Weight: 106.1kg BSA: 2.16m^2 Pt. Status: Room:  SONOGRAPHER Karrie Doffing PERFORMING Delma Freeze Penn ATTENDING Mcmanus, Nicholos Johns ADMITTING Memon, Whitney ORDERING Memon, Jehanzeb REFERRING [place], [place] cc:  ------------------------------------------------------------ LV EF: 55%  ------------------------------------------------------------ Indications: Chest pain 786.51.  ------------------------------------------------------------ History: PMH: No prior cardiac history. Risk factors: Current tobacco use.  ------------------------------------------------------------ Study Conclusions  - Left ventricle: The cavity size was mildly dilated. Wall thickness was increased in a pattern of mild LVH. The estimated ejection fraction was 55%. Wall motion was normal; there were no regional wall motion abnormalities. Doppler parameters are consistent with abnormal left ventricular relaxation (grade 1 diastolic dysfunction). - Ascending aorta: The ascending aorta was mildly ectatic. - Mitral valve: Trivial regurgitation. - Left atrium: The atrium was moderately dilated. - Tricuspid valve: Trivial regurgitation. - Pericardium, extracardiac: There was no pericardial effusion. Transthoracic echocardiography. M-mode, complete 2D, spectral Doppler, and color Doppler. Height: Height: 188cm. Height: 74in. Weight: Weight: 106.1kg. Weight: 233.5lb. Body mass index: BMI: 30kg/m^2. Body surface  area: BSA: 2.15m^2. Patient status: Inpatient. Location: Bedside.        Hospital Course: This 56 year old man came to the hospital yesterday with a two-month history of positional chest pain. The pain is dull but is related to position and he can often  relieve the pain by changing position of his chest. It is not particularly related to exertion. It is not pleuritic. He does have risk factors including obesity, elevated blood pressure, elevated glucose and hypertriglyceridemia as well as tobacco abuse. There is no family history of early coronary artery disease. He was admitted to telemetry and appropriately serial cardiac enzymes were done. These were all negative. Electrocardiogram has not changed and does not indicate any worrisome ST segment changes. He is currently chest pain-free and wishes to go home. He did have an echocardiogram which shows mild LVH and ejection fraction 55%.  Discharge Exam: Blood pressure 131/78, pulse 65, temperature 98.1 F (36.7 C), temperature source Oral, resp. rate 20, height 6\' 2"  (1.88 m), weight 106.142 kg (234 lb), SpO2 98.00%. He looks systemically well. He is anxious. Heart sounds are present and normal. Lung fields are clear. He is alert and orientated without any focal neurological signs.  Disposition: Home. He will have stress test as an outpatient which is being arranged by Dr. Diona Browner.  Discharge Orders    Future Appointments: Provider: Department: Dept Phone: Center:   03/08/2011 12:00 PM Tammy Ileene Patrick, RN [facility] [phone] [id number]   03/12/2011 1:00 PM [name], NP [facility] [phone] [id number]     Future Orders Please Complete By Expires   Diet - low sodium heart healthy      Increase activity slowly         Follow-up Information    Follow up with [facility]. (Radiology Dept 9am Stress test)    Contact information:   [address] [place] [address]       Follow up with Colette Ribas, MD. Schedule an appointment as soon as possible for a visit in 2 weeks.   Contact information:   [address] [place] [address] [place] [address] [phone]       Follow up with [name],  NP. (Feb 12th, 1:00pm at [facility])          Signed: Wilson Singer Pager [phone]  03/07/2011, 2:41 PM

## 2011-03-07 NOTE — Progress Notes (Signed)
*  PRELIMINARY RESULTS* Echocardiogram 2D Echocardiogram has been performed.  Daniel [name] 03/07/2011, 10:29 AM

## 2011-03-08 ENCOUNTER — Encounter (HOSPITAL_COMMUNITY)
Admission: RE | Admit: 2011-03-08 | Discharge: 2011-03-08 | Disposition: A | Discharge status: Home / Self Care | Payer: BC Managed Care – PPO | Source: Ambulatory Visit | Attending: Adult Health | Admitting: Adult Health

## 2011-03-08 ENCOUNTER — Ambulatory Visit (INDEPENDENT_AMBULATORY_CARE_PROVIDER_SITE_OTHER): Payer: BC Managed Care – PPO | Admitting: *Deleted

## 2011-03-08 ENCOUNTER — Other Ambulatory Visit: Payer: Self-pay

## 2011-03-08 DIAGNOSIS — R079 Chest pain, unspecified: Secondary | ICD-10-CM | POA: Insufficient documentation

## 2011-03-08 MED ORDER — TECHNETIUM TC 99M TETROFOSMIN IV KIT
10.0000 | PACK | Freq: Once | INTRAVENOUS | Status: AC | PRN
  Administered 2011-03-08: 9.8 via INTRAVENOUS

## 2011-03-08 MED ORDER — TECHNETIUM TC 99M TETROFOSMIN IV KIT
30.0000 | PACK | Freq: Once | INTRAVENOUS | Status: AC | PRN
  Administered 2011-03-08: 28.5 via INTRAVENOUS

## 2011-03-08 NOTE — Progress Notes (Signed)
Stress Lab Nurses Notes - [facility]  DIXIE JAFRI 03/08/2011  Reason for doing test: Chest Pain Type of test: Stress Myoview Nurse performing test: Parke Poisson, RN Nuclear Medicine Tech: Lyndel Pleasure Echo Tech: Not Applicable MD performing test: R. Rothbart Family MD: Phillips Odor Test explained and consent signed: yes IV started: 22g jelco, Saline lock flushed, No redness or edema and Saline lock started in radiology Symptoms: SOB & Fatigue Treatment/Intervention: None Reason test stopped: fatigue After recovery IV was: Discontinued via X-ray tech and No redness or edema Patient to return to Nuc. Med at : 12:25 Patient discharged: Home Patient's Condition upon discharge was: stable Comments: During test peak BP 210/92 & HR 121.  Recovery BP 148/88 & HR 83.  Symptoms resolved in recovery. Erskine Speed T

## 2011-03-11 NOTE — ED Provider Notes (Signed)
Medical screening examination/treatment/procedure(s) were performed by non-physician practitioner and as supervising physician I was immediately available for consultation/collaboration.   Emilie Carp M Cynthia Cogle, DO 03/11/11 1814 

## 2011-03-12 ENCOUNTER — Ambulatory Visit (INDEPENDENT_AMBULATORY_CARE_PROVIDER_SITE_OTHER): Payer: BC Managed Care – PPO | Admitting: Adult Health

## 2011-03-12 ENCOUNTER — Encounter: Payer: Self-pay | Admitting: Adult Health

## 2011-03-12 DIAGNOSIS — F17200 Nicotine dependence, unspecified, uncomplicated: Secondary | ICD-10-CM

## 2011-03-12 DIAGNOSIS — E781 Pure hyperglyceridemia: Secondary | ICD-10-CM

## 2011-03-12 DIAGNOSIS — Z720 Tobacco use: Secondary | ICD-10-CM

## 2011-03-12 DIAGNOSIS — R079 Chest pain, unspecified: Secondary | ICD-10-CM

## 2011-03-12 MED ORDER — ENALAPRIL MALEATE 5 MG PO TABS: 5.0000 mg | ORAL_TABLET | Freq: Every day | ORAL | Status: DC

## 2011-03-12 NOTE — Patient Instructions (Signed)
**Note De-Identified Gwendlyn Hanback Obfuscation** Your physician has recommended you make the following change in your medication: increase Vasotec to 5 mg daily  Your physician recommends that you schedule a follow-up appointment in: 1 month

## 2011-03-12 NOTE — Assessment & Plan Note (Signed)
He has stopped smoking using the nicotene patch. I have encourage continued cessation.

## 2011-03-12 NOTE — Assessment & Plan Note (Signed)
Stress myoview was found to be abnormal." On tomographic images reconstructed in the standard planes, there was a moderate sized defect of mild to moderate intensity involving the inferior wall and extending inferoseptally, inferolaterally, and infeorapical.  Apparent ischemia perhaps with superimposed infarction,present in the distribution of the posterior descending artery. Normal EF of 55%. "  This is discussed with the patient and his wife, with description of the area mentioned as abnormal on heart model in exam room. I have recommended he proceed with a cardiac cath for definitive evaluation of coronary anatomy. I have described risks and benefits of cardiac catheterization. He does not wish to proceed at this time. He states he is feeling well and wants to work more. He is concerned about his finances and hospital bills. I have explained the risks involved in possibility of coronary disease without intervention, (should this be needed) to include myocardial infarction.[name] He wants to think about it and let us know. In the interm I have given asked him to start taking ECASA 325 mg daily. I have increased his enalapril to 5 mg daily. He is to follow up with Dr. Diona Browner in one month unless he is symptomatic. He will be referred to [facility] for cath should he have recurrent pain. I have talked to him about this and he verbalizes understanding.

## 2011-03-12 NOTE — Progress Notes (Signed)
   HPI; Daniel [name] is a 56 y/o type A person we are following post hospitalization after consultation for chest pain. He has newly diagnosed hypertension and hypertriglyceridemia. He was ruled out for MI by cardiac markers, and scheduled for OP stress myoview prior to this appointment. He was advised to stop smoking and to increase exercise. He has lost 10l bs since discharge. He was placed on enalapril 2.5 mg once a day.  He is here to discuss stress test results. He is without complaint of recurrent chest pain, but states he feels tired. He has returned to work. He is concerned about his medical bills and wanted to get back to work ASAP. Currently asymptomatic.  No Known Allergies  Current Outpatient Prescriptions  Medication Sig Dispense Refill  . enalapril (VASOTEC) 5 MG tablet Take 1 tablet (5 mg total) by mouth daily.  30 tablet  3  . Multiple Vitamin (MULITIVITAMIN WITH MINERALS) TABS Take 1 tablet by mouth daily.      . Nicotine (NICODERM CQ TD) Place onto the skin as directed.        Past Medical History  Diagnosis Date  . Diabetes mellitus   . Hypertension     No past surgical history on file.  VHQ:IONGEX of systems complete and found to be negative unless listed above PHYSICAL EXAM BP 141/93  Pulse 65  Resp 16  Ht 6\' 2"  (1.88 m)  Wt 227 lb (102.967 kg)  BMI 29.15 kg/m2  General: Well developed, well nourished, in no acute distress Head: Eyes PERRLA, No xanthomas.   Normal cephalic and atramatic  Lungs: Clear bilaterally to auscultation and percussion. Heart: HRRR S1 S2, without MRG.  Pulses are 2+ & equal.            No carotid bruit. No JVD.  No abdominal bruits. No femoral bruits. Abdomen: Bowel sounds are positive, abdomen soft and non-tender without masses or                  Hernia's noted. Msk:  Back normal, normal gait. Normal strength and tone for age. Extremities: No clubbing, cyanosis or edema.  DP +1 Neuro: Alert and oriented X 3. Psych:  Good affect,  responds appropriately   ASSESSMENT AND PLAN

## 2011-03-13 ENCOUNTER — Other Ambulatory Visit: Payer: Self-pay

## 2011-03-13 ENCOUNTER — Telehealth: Payer: Self-pay | Admitting: Adult Health

## 2011-03-13 DIAGNOSIS — Z7901 Long term (current) use of anticoagulants: Secondary | ICD-10-CM

## 2011-03-13 DIAGNOSIS — Z01818 Encounter for other preprocedural examination: Secondary | ICD-10-CM

## 2011-03-13 DIAGNOSIS — R9439 Abnormal result of other cardiovascular function study: Secondary | ICD-10-CM

## 2011-03-13 NOTE — Telephone Encounter (Addendum)
**Note De-Identified Daniel [name] Obfuscation** At yesterday's OV with [name], NP pt. was advised to have cath due to CP and abnormal myoview. Pt. states that he has had time to think about having cath and has decided to proceed. Cath is scheduled for 03-15-11 at 9:30 with Dr. Excell Seltzer and pt. is aware and is going to have lab work drawn in the morning then come to office for cath instructions./LV

## 2011-03-13 NOTE — Telephone Encounter (Signed)
Patient would like to schedule his cath now. Please call patient back. / tg

## 2011-03-14 LAB — CBC WITH DIFFERENTIAL/PLATELET
Basophils Relative: 1 % (ref 0–1)
Eosinophils Absolute: 0.3 10*3/uL (ref 0.0–0.7)
Eosinophils Relative: 5 % (ref 0–5)
MCH: 32.3 pg (ref 26.0–34.0)
MCHC: 33.4 g/dL (ref 30.0–36.0)
Monocytes Relative: 7 % (ref 3–12)
Neutrophils Relative %: 48 % (ref 43–77)
Platelets: 218 10*3/uL (ref 150–400)

## 2011-03-15 ENCOUNTER — Inpatient Hospital Stay (HOSPITAL_BASED_OUTPATIENT_CLINIC_OR_DEPARTMENT_OTHER)
Admission: RE | Admit: 2011-03-15 | Discharge: 2011-03-15 | Disposition: A | Discharge status: Home / Self Care | Payer: BC Managed Care – PPO | Source: Ambulatory Visit | Attending: Cardiology | Admitting: Cardiology

## 2011-03-15 ENCOUNTER — Encounter (HOSPITAL_BASED_OUTPATIENT_CLINIC_OR_DEPARTMENT_OTHER)
Admission: RE | Disposition: A | Discharge status: Home / Self Care | Payer: Self-pay | Source: Ambulatory Visit | Attending: Cardiology

## 2011-03-15 DIAGNOSIS — E119 Type 2 diabetes mellitus without complications: Secondary | ICD-10-CM | POA: Insufficient documentation

## 2011-03-15 DIAGNOSIS — R9439 Abnormal result of other cardiovascular function study: Secondary | ICD-10-CM | POA: Insufficient documentation

## 2011-03-15 DIAGNOSIS — R0789 Other chest pain: Secondary | ICD-10-CM | POA: Insufficient documentation

## 2011-03-15 DIAGNOSIS — R079 Chest pain, unspecified: Secondary | ICD-10-CM

## 2011-03-15 DIAGNOSIS — I517 Cardiomegaly: Secondary | ICD-10-CM

## 2011-03-15 DIAGNOSIS — F17200 Nicotine dependence, unspecified, uncomplicated: Secondary | ICD-10-CM | POA: Insufficient documentation

## 2011-03-15 DIAGNOSIS — E781 Pure hyperglyceridemia: Secondary | ICD-10-CM | POA: Insufficient documentation

## 2011-03-15 DIAGNOSIS — I10 Essential (primary) hypertension: Secondary | ICD-10-CM | POA: Insufficient documentation

## 2011-03-15 LAB — BASIC METABOLIC PANEL
BUN: 17 mg/dL (ref 6–23)
CO2: 30 mEq/L (ref 19–32)
Chloride: 102 mEq/L (ref 96–112)
Creat: 1.24 mg/dL (ref 0.50–1.35)

## 2011-03-15 LAB — APTT: aPTT: 34 seconds (ref 24–37)

## 2011-03-15 SURGERY — JV LEFT HEART CATHETERIZATION WITH CORONARY ANGIOGRAM
Anesthesia: Moderate Sedation

## 2011-03-15 MED ORDER — SODIUM CHLORIDE 0.9 % IV SOLN
INTRAVENOUS | Status: DC
  Administered 2011-03-15: 09:00:00 via INTRAVENOUS

## 2011-03-15 MED ORDER — SODIUM CHLORIDE 0.9 % IV SOLN: INTRAVENOUS | Status: DC

## 2011-03-15 MED ORDER — ACETAMINOPHEN 325 MG PO TABS: 650.0000 mg | ORAL_TABLET | ORAL | Status: DC | PRN

## 2011-03-15 MED ORDER — ONDANSETRON HCL 4 MG/2ML IJ SOLN: 4.0000 mg | Freq: Four times a day (QID) | INTRAMUSCULAR | Status: DC | PRN

## 2011-03-15 MED ORDER — DIAZEPAM 5 MG PO TABS
5.0000 mg | ORAL_TABLET | Freq: Once | ORAL | Status: AC
  Administered 2011-03-15: 5 mg via ORAL

## 2011-03-15 NOTE — Progress Notes (Signed)
Bedrest begins @ 1115, Dr. Shirlee Latch in to discuss results with patient and wife.

## 2011-03-15 NOTE — H&P (View-Only) (Signed)
   HPI; Daniel [name] is a 55 y/o type A person we are following post hospitalization after consultation for chest pain. He has newly diagnosed hypertension and hypertriglyceridemia. He was ruled out for MI by cardiac markers, and scheduled for OP stress myoview prior to this appointment. He was advised to stop smoking and to increase exercise. He has lost 10l bs since discharge. He was placed on enalapril 2.5 mg once a day.  He is here to discuss stress test results. He is without complaint of recurrent chest pain, but states he feels tired. He has returned to work. He is concerned about his medical bills and wanted to get back to work ASAP. Currently asymptomatic.  No Known Allergies  Current Outpatient Prescriptions  Medication Sig Dispense Refill  . enalapril (VASOTEC) 5 MG tablet Take 1 tablet (5 mg total) by mouth daily.  30 tablet  3  . Multiple Vitamin (MULITIVITAMIN WITH MINERALS) TABS Take 1 tablet by mouth daily.      . Nicotine (NICODERM CQ TD) Place onto the skin as directed.        Past Medical History  Diagnosis Date  . Diabetes mellitus   . Hypertension     No past surgical history on file.  ROS:Review of systems complete and found to be negative unless listed above PHYSICAL EXAM BP 141/93  Pulse 65  Resp 16  Ht 6' 2" (1.88 m)  Wt 227 lb (102.967 kg)  BMI 29.15 kg/m2  General: Well developed, well nourished, in no acute distress Head: Eyes PERRLA, No xanthomas.   Normal cephalic and atramatic  Lungs: Clear bilaterally to auscultation and percussion. Heart: HRRR S1 S2, without MRG.  Pulses are 2+ & equal.            No carotid bruit. No JVD.  No abdominal bruits. No femoral bruits. Abdomen: Bowel sounds are positive, abdomen soft and non-tender without masses or                  Hernia's noted. Msk:  Back normal, normal gait. Normal strength and tone for age. Extremities: No clubbing, cyanosis or edema.  DP +1 Neuro: Alert and oriented X 3. Psych:  Good affect,  responds appropriately   ASSESSMENT AND PLAN 

## 2011-03-15 NOTE — Interval H&P Note (Signed)
History and Physical Interval Note:  03/15/2011 11:03 AM  Daniel [name]  has presented today for surgery, with the diagnosis of cp  The various methods of treatment have been discussed with the patient and family. After consideration of risks, benefits and other options for treatment, the patient has consented to  Procedure(s) (LRB): JV LEFT HEART CATHETERIZATION WITH CORONARY ANGIOGRAM (N/A) as a surgical intervention .  The patients' history has been reviewed, patient examined, no change in status, stable for surgery.  I have reviewed the patients' chart and labs.  Questions were answered to the patient's satisfaction.     Leomar Westberg [employer]

## 2011-03-15 NOTE — Progress Notes (Signed)
Discharge instructions completed, ambulated in hall without bleeding from right groin site, discharged to home via wheelchair with wife.

## 2011-03-15 NOTE — Procedures (Signed)
   Cardiac Catheterization Procedure Note  Name: Daniel [name] MRN: [id number] DOB: [birth year]  Procedure: Left Heart Cath, Selective Coronary Angiography, LV angiography, aortic root angiography  Indication: Chest pain, abnormal myoview.    Procedural details: The right groin was prepped, draped, and anesthetized with 1% lidocaine. Using modified Seldinger technique, a 5 French sheath was introduced into the right femoral artery. Standard Judkins catheters were used for coronary angiography, aortic root angiography, and left ventriculography. Catheter exchanges were performed over a guidewire. There were no immediate procedural complications. The patient was transferred to the post catheterization recovery area for further monitoring.  Procedural Findings: Hemodynamics:  AO 117/97 LV 115/20   Coronary angiography: Coronary dominance: right  Left mainstem: No angiographic coronary disease.  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   Left anterior descending (LAD): No angiographic CAD.  Left circumflex (LCx): No angiographic CAD.  Right coronary artery (RCA): No angiographic CAD.  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   Left ventriculography: Left ventricular systolic function is normal, LVEF is estimated at 55-60%, there is no significant mitral regurgitation   Aortic root angiography: No visible dissection.  Suspect mild dilation ascending aorta.   Final Conclusions:  No angiographic coronary artery disease.  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  I do not think that these are of any hemodynamic significance.  Suspect noncardiac chest pain and false positive myoview.   [name] 03/15/2011, 11:04 AM

## 2011-03-28 ENCOUNTER — Encounter: Payer: BC Managed Care – PPO | Admitting: Adult Health

## 2011-03-29 ENCOUNTER — Ambulatory Visit (INDEPENDENT_AMBULATORY_CARE_PROVIDER_SITE_OTHER): Payer: BC Managed Care – PPO | Admitting: Adult Health

## 2011-03-29 ENCOUNTER — Encounter: Payer: Self-pay | Admitting: Adult Health

## 2011-03-29 DIAGNOSIS — R079 Chest pain, unspecified: Secondary | ICD-10-CM

## 2011-03-29 NOTE — Patient Instructions (Signed)
Your physician recommends that you schedule a follow-up appointment in: As needed  

## 2011-03-29 NOTE — Progress Notes (Signed)
   HPI: [name] is a 56 y/o male patient of Dr.Rothbart we are seeing post cardiac cath after abnormal stress myoview with apparent ischemia in the inferior wall. Cardiac cath was completed demonstrating "No angiographic coronary artery disease. 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 I do not think that these are of any hemodynamic significance. Suspect noncardiac chest pain and false positive" per Dr. Shirlee [name].  He comes today feeling well having lost 25l bs.  He is eating right and has stopped smoking.  He has returned to work and has no further complaints.   No Known Allergies  Current Outpatient Prescriptions  Medication Sig Dispense Refill  . enalapril (VASOTEC) 5 MG tablet Take 1 tablet (5 mg total) by mouth daily.  30 tablet  3  . Multiple Vitamin (MULITIVITAMIN WITH MINERALS) TABS Take 1 tablet by mouth daily.        Past Medical History  Diagnosis Date  . Diabetes mellitus   . Hypertension     No past surgical history on file.  ZOX:WRUEAV of systems complete and found to be negative unless listed above PHYSICAL EXAM BP 136/87  Pulse 54  Resp 16  Ht 6\' 2"  (1.88 m)  Wt 228 lb (103.42 kg)  BMI 29.27 kg/m2  General: Well developed, well nourished, in no acute distress Head: Eyes PERRLA, No xanthomas.   Normal cephalic and atramatic  Lungs: Clear bilaterally to auscultation and percussion. Heart: HRRR S1 S2, without MRG.  Pulses are 2+ & equal.            No carotid bruit. No JVD.  No abdominal bruits. No femoral bruits. Abdomen: Bowel sounds are positive, abdomen soft and non-tender without masses or                  Hernia's noted. Msk:  Back normal, normal gait. Normal strength and tone for age. Extremities: No clubbing, cyanosis or edema.  DP +1 Neuro: Alert and oriented X 3. Psych:  Good affect, responds appropriately    ASSESSMENT AND PLAN

## 2011-03-29 NOTE — Assessment & Plan Note (Signed)
Cardiac cath is reassuring. He is asymptomatic. I have congratulated him on his wt loss, smoking cessation and healthy lifestyle. He should continue this for overall health status. He will follow-up with his PCP for continued assessment of cholesterol status. We will see him on prn basis.

## 2011-04-09 ENCOUNTER — Ambulatory Visit: Payer: BC Managed Care – PPO | Admitting: Cardiology

## 2011-06-06 MED ORDER — HYDROCODONE-ACETAMINOPHEN 7.5-750 MG PO TABS
ORAL_TABLET | Freq: Four times a day (QID) | ORAL | Status: AC | PRN
Start: 2011-06-06 — End: 2011-07-06

## 2011-06-06 NOTE — Progress Notes (Signed)
HISTORY OF PRESENT ILLNESS:  Mr. Goertzen presents for evaluation of low back  pain.  He has pain when he stands and when he walks.  Less pain when he sits  and less pain when he lies down.  Standing and walking does cause the patient  to experience some pain.  He also has some pain in the neck and pain when he  rotates the cervical spine.  At times he has pain going down his right arm.       PHYSICAL EXAMINATION:  On physical examination today, the patient is oriented  to person, place, and time.  Normal affect.  He has some pain with flexion of  the cervical spine and some pain with rotation.  Deep tendon reflexes are 1/4  at the biceps and triceps.  Pinprick sensation is normal.  Muscle strength is  good.       Physical examination of the lumbar spine shows he has some pain on flexion of  the lumbar spine.  Straight leg raise is negative.  Muscle strength is good.         IMPRESSION:  1.  Degenerative disk disease of the lumbar spine.  2.  Chronic neuropathic pain.  3.  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      PLAN:  1.  We will have the patient take hydrocodone 7.5 mg p.o. q. 6 hours p.r.n.  pain, a prescription for 120 pills.    2.  He was told that I will be leaving [place] and he will need to find another  provider to get his chronic narcotic medication.         Becka Lagasse A Shateka Petrea, MD     [id number]  DD: 06/06/2011 13:26  DT: 06/07/2011 09:12  SSI File#: [id number]  Job #: [id number]  CC:

## 2011-06-06 NOTE — Progress Notes (Signed)
This office note has been dictated.

## 2011-06-07 NOTE — Telephone Encounter (Signed)
error 

## 2011-06-07 NOTE — Telephone Encounter (Signed)
Patient sees LAZ. He would like to see HHA when LAZ leaves. Pt has [place]. Put in [facility] folder for review.

## 2011-06-11 NOTE — Communication Body (Signed)
HISTORY OF PRESENT ILLNESS:  [name] presents for evaluation of low back  pain.  He has pain when he stands and when he walks.  Less pain when he sits  and less pain when he lies down.  Standing and walking does cause the patient  to experience some pain.  He also has some pain in the neck and pain when he  rotates the cervical spine.  At times he has pain going down his right arm.       PHYSICAL EXAMINATION:  On physical examination today, the patient is oriented  to person, place, and time.  Normal affect.  He has some pain with flexion of  the cervical spine and some pain with rotation.  Deep tendon reflexes are 1/4  at the biceps and triceps.  Pinprick sensation is normal.  Muscle strength is  good.       Physical examination of the lumbar spine shows he has some pain on flexion of  the lumbar spine.  Straight leg raise is negative.  Muscle strength is good.         IMPRESSION:  1.  Degenerative disk disease of the lumbar spine.  2.  Chronic neuropathic pain.  3.  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      PLAN:  1.  We will have the patient take hydrocodone 7.5 mg p.o. q. 6 hours p.r.n.  pain, a prescription for 120 pills.    2.  He was told that I will be leaving [facility] and he will need to find another  provider to get his chronic narcotic medication.         Regis BillLAWRENCE A ZEFF, MD     [id number]  DD: 06/06/2011 13:26  DT: 06/07/2011 09:12  SSI File#: [id number]  Job #: [id number]  CC:

## 2011-06-14 MED ORDER — ALBUTEROL SULFATE HFA 108 (90 BASE) MCG/ACT IN AERS
108 (90 Base) MCG/ACT | Freq: Four times a day (QID) | RESPIRATORY_TRACT | Status: DC | PRN
Start: 2011-06-14 — End: 2013-12-13

## 2011-06-14 MED ORDER — DEXILANT 60 MG PO CPDR
60 MG | ORAL_CAPSULE | Freq: Every day | ORAL | Status: DC
Start: 2011-06-14 — End: 2011-06-18

## 2011-06-14 MED ORDER — TIOTROPIUM BROMIDE MONOHYDRATE 18 MCG IN CAPS
18 MCG | PACK | Freq: Every day | RESPIRATORY_TRACT | Status: DC
Start: 2011-06-14 — End: 2013-05-10

## 2011-06-14 MED ORDER — BLOOD GLUCOSE MONITOR KIT
PACK | Status: DC
Start: 2011-06-14 — End: 2018-09-09

## 2011-06-14 MED ORDER — ASPIRIN EC 81 MG PO TBEC
81 MG | ORAL_TABLET | Freq: Every day | ORAL | Status: DC
Start: 2011-06-14 — End: 2013-09-16

## 2011-06-14 MED ORDER — NAPROXEN 500 MG PO TABS
500 MG | ORAL_TABLET | Freq: Two times a day (BID) | ORAL | Status: DC
Start: 2011-06-14 — End: 2018-09-09

## 2011-06-14 MED ORDER — BLOOD GLUCOSE TEST VI STRP
ORAL_STRIP | Status: DC
Start: 2011-06-14 — End: 2018-09-09

## 2011-06-14 MED ORDER — CYCLOBENZAPRINE HCL 10 MG PO TABS
10 MG | ORAL_TABLET | Freq: Three times a day (TID) | ORAL | Status: DC | PRN
Start: 2011-06-14 — End: 2018-09-09

## 2011-06-14 MED ORDER — LISINOPRIL-HYDROCHLOROTHIAZIDE 20-12.5 MG PO TABS
ORAL_TABLET | Freq: Every day | ORAL | Status: DC
Start: 2011-06-14 — End: 2013-09-22

## 2011-06-14 MED ORDER — METFORMIN HCL ER 500 MG PO TB24
500 MG | ORAL_TABLET | Freq: Two times a day (BID) | ORAL | Status: DC
Start: 2011-06-14 — End: 2019-01-01

## 2011-06-14 NOTE — Progress Notes (Signed)
Subjective:      Patient ID: [name] Peck is a 56 y.o. male.    HPIThis patient was released from [facility] 05/29/11, after 7 months of  Incarceration for driving without license. Has DDD of neck and lumbar spine, DM-2, Hypertension,  Hyperlipidemia, COPD and is a smoker. He has been out of him meds for 5 days. He lives in a shelter, city [employer].      Review of Systems   Constitutional: Negative for fever, chills, diaphoresis, activity change, appetite change, fatigue and unexpected weight change.   HENT: Negative for hearing loss, ear pain, nosebleeds, congestion, sore throat, facial swelling, sneezing, drooling, mouth sores, trouble swallowing, neck pain, neck stiffness, dental problem, voice change, postnasal drip, sinus pressure, tinnitus and ear discharge.    Eyes: Negative for photophobia, pain, discharge, redness, itching and visual disturbance.   Respiratory: Negative for apnea, cough, choking, chest tightness, shortness of breath and wheezing.    Cardiovascular: Negative for chest pain, palpitations and leg swelling.   Gastrointestinal: Negative for nausea, vomiting, abdominal pain, constipation, blood in stool, anal bleeding and rectal pain.   Genitourinary: Negative for dysuria, urgency, frequency, hematuria, flank pain, decreased urine volume, discharge, penile swelling, scrotal swelling, enuresis, difficulty urinating and testicular pain.   Musculoskeletal: Negative for myalgias, joint swelling, arthralgias and gait problem. Back pain: Neck and lumbar pain due to DDD.   Skin: Negative for color change, pallor, rash and wound.   Neurological: Negative for dizziness, tremors, seizures, syncope, facial asymmetry, speech difficulty, weakness, light-headedness, numbness and headaches.   Hematological: Negative for adenopathy. Does not bruise/bleed easily.   Psychiatric/Behavioral: Negative for suicidal ideas, hallucinations, behavioral problems, confusion, sleep disturbance,  self-injury, dysphoric mood, decreased concentration and agitation. The patient is not nervous/anxious and is not hyperactive.        Objective:   Physical Exam   Nursing note and vitals reviewed.  Constitutional: He is oriented to person, place, and time. He appears well-developed and well-nourished. No distress.   HENT:   Head: Normocephalic and atraumatic.   Right Ear: External ear normal.   Left Ear: External ear normal.   Nose: Nose normal.   Mouth/Throat: Oropharynx is clear and moist. No oropharyngeal exudate.   Eyes: Conjunctivae and EOM are normal. Pupils are equal, round, and reactive to light. Right eye exhibits no discharge. Left eye exhibits no discharge. No scleral icterus.   Neck: Normal range of motion. Neck supple. No JVD present. No tracheal deviation present. No thyromegaly present.   Cardiovascular: Normal rate, regular rhythm, normal heart sounds and intact distal pulses.  Exam reveals no friction rub.    No murmur heard.  Pulses:       Carotid pulses are 2+ on the right side, and 2+ on the left side.       Radial pulses are 2+ on the right side, and 2+ on the left side.        Femoral pulses are 2+ on the right side, and 2+ on the left side.       Popliteal pulses are 2+ on the right side, and 2+ on the left side.        Dorsalis pedis pulses are 2+ on the right side, and 2+ on the left side.        Posterior tibial pulses are 2+ on the right side, and 2+ on the left side.   Pulmonary/Chest: Effort normal and breath sounds normal. No stridor. No respiratory distress. He  has no wheezes. He has no rales. He exhibits no tenderness.   Abdominal: Soft. Bowel sounds are normal. He exhibits no distension and no mass. There is no tenderness. There is no rebound and no guarding.   Musculoskeletal: Normal range of motion. He exhibits no edema and no tenderness.   Lymphadenopathy:     He has no cervical adenopathy.   Neurological: He is oriented to person, place, and time. He displays normal reflexes. No  cranial nerve deficit. He exhibits normal muscle tone. Coordination normal.   Skin: Skin is warm and dry. No rash noted. He is not diaphoretic. No pallor.   Psychiatric: He has a normal mood and affect. His behavior is normal. Judgment and thought content normal.     Bilateral visual examination of feet show no foot ulcers or other pathology. Deep tendon reflexes are intact bilateral at knees, and ankles bilateral. Sensory is intact to pinprick and vibratory is intact. Perpheral pulses including DP, PT, Femoral, and popliteal arteries are intact.    Assessment:      Hypertension  DM-2  Hyperlipidemia  Ck labs  Restart all meds    DDD of neck/lumbar spine, will need new pain clinic referral since Dr Payton Spark is leaving [place]  Will ck with [facility]       Plan:

## 2011-06-14 NOTE — Addendum Note (Signed)
Addended by: Romie Levee on: 06/14/2011 04:35 PM     Modules accepted: Orders

## 2011-06-14 NOTE — Patient Instructions (Addendum)
Stopping Smoking: After Your Visit  Your Care Instructions  Cigarette smokers crave the nicotine in cigarettes. Giving it up is much harder than simply changing a habit. Your body has to stop craving the nicotine. It is hard to quit, but you can do it. There are many tools that people use to quit smoking. You may find that combining tools works best for you.  There are several steps to quitting. First you get ready to quit. Then you get support to help you. After that, you learn new skills and behaviors to become a nonsmoker. For many people, a necessary step is getting and using medicine.  Your doctor will help you set up the plan that best meets your needs. You may want to attend a smoking cessation program to help you quit smoking. When you choose a program, look for one that has proven success. Ask your doctor for ideas. You will greatly increase your chances of success if you take medicine as well as get counseling or join a cessation program.  Some of the changes you feel when you first quit tobacco are uncomfortable. Your body will miss the nicotine at first, and you may feel short-tempered and grumpy. You may have trouble sleeping or concentrating. Medicine can help you deal with these symptoms. You may struggle with changing your smoking habits and rituals. The last step is the tricky one: Be prepared for the smoking urge to continue for a time. This is a lot to deal with, but keep at it. You will feel better.  Follow-up care is a key part of your treatment and safety. Be sure to make and go to all appointments, and call your doctor if you are having problems. It???s also a good idea to know your test results and keep a list of the medicines you take.  How can you care for yourself at home?  ?? Ask your family, friends, and coworkers for support. You have a better chance of quitting if you have help and support.  ?? Join a support group, such as Nicotine Anonymous, for people who are trying to quit  smoking.  ?? Consider signing up for a smoking cessation program, such as the [employer] Association's Freedom from Smoking program.  ?? Set a quit date. Pick your date carefully so that it is not right in the middle of a big deadline or stressful time. Once you quit, do not even take a puff. Get rid of all ashtrays and lighters after your last cigarette. Clean your house and your clothes so that they do not smell of smoke.  ?? Learn how to be a nonsmoker. Think about ways you can avoid those things that make you reach for a cigarette.  ?? Avoid situations that put you at greatest risk for smoking. For some people, it is hard to have a drink with friends without smoking. For others, they might skip a coffee break with coworkers who smoke.  ?? Change your daily routine. Take a different route to work or eat a meal in a different place.  ?? Cut down on stress. Calm yourself or release tension by doing an activity you enjoy, such as reading a book, taking a hot bath, or gardening.  ?? Talk to your doctor or pharmacist about nicotine replacement therapy, which replaces the nicotine in your body. You still get nicotine but you do not use tobacco. Nicotine replacement products help you slowly reduce the amount of nicotine you need. These products come in several   forms, many of them available over-the-counter:  ?? Nicotine patches  ?? Nicotine gum and lozenges  ?? Nicotine inhaler  ?? Ask your doctor about bupropion (Wellbutrin) or varenicline (Chantix), which are prescription medicines. They do not contain nicotine. They help you by reducing withdrawal symptoms, such as stress and anxiety.  ?? Some people find hypnosis, acupuncture, and massage helpful for ending the smoking habit.  ?? Eat a healthy diet and get regular exercise. Having healthy habits will help your body move past its craving for nicotine.  ?? Be prepared to keep trying. Most people are not successful the first few times they try to quit. Do not get mad at yourself  if you smoke again. Make a list of things you learned and think about when you want to try again, such as next week, next month, or next year.    Where can you learn more?    Go to [email or web address] and sign in to your MyChart account.    Enter [phone] in the Search Health Information box to learn more about ???Stopping Smoking: After Your Visit.???    If you do not have an account, please click on the ???Sign Up Now??? link.    ?? 2006-2012 [employer]. Care instructions adapted under license by [facility]. This care instruction is for use with your licensed healthcare professional. If you have questions about a medical condition or this instruction, always ask your healthcare professional. [employer] disclaims any warranty or liability for your use of this information.  Content Version: 9.4.94723; Last Revised: August 16, 2009                Diabetes Diet Guidelines: After Your Visit  Your Care Instructions  A healthy diet is important to manage diabetes. It helps keep your blood sugar at a target level (which you set with your doctor). A diet for diabetes does not mean that you have to eat special foods. You can eat what your family eats, including sweets once in a while. But you do have to pay attention to how often you eat and how much you eat of certain foods.  Managing the amount of carbohydrate you eat is an important part of a healthy diet for diabetes. Carbohydrate is found in sugar and sweets, grains, fruit, starchy vegetables, and milk and yogurt.  You may want to work with a dietitian or a certified diabetes educator (CDE) to help you plan meals and snacks. A dietitian or CDE can also help you lose weight if that is one of your goals.  Follow-up care is a key part of your treatment and safety. Be sure to make and go to all appointments, and call your doctor if you are having problems. It???s also a good idea to know your test results and keep a list of the  medicines you take.  How can you care for yourself at home?  ?? Learn which foods have carbohydrate.  ?? Bread, cereal, pasta, and rice have about 15 grams of carbohydrate in a serving. A serving is 1 slice of bread (1 ounce), ?? cup of cooked cereal, or 1/3 cup of cooked pasta or rice.  ?? Fruits have 15 grams of carbohydrate in a serving. A serving is 1 small fresh fruit, such as an apple or orange; ?? of a banana; ?? cup of cooked or canned fruit; ?? cup of fruit juice; 1 cup of melon or raspberries; or 2 tablespoons of dried fruit.  ?? Milk and no-sugar-added  yogurt have 15 grams of carbohydrate in a serving. A serving is 1 cup of milk or 2/3 cup of no-sugar-added yogurt.  ?? Starchy vegetables have 15 grams of carbohydrate in a serving. A serving is ?? cup of mashed potatoes or sweet potato; 1 cup winter squash; ?? of a small baked potato; 1/4 cup of cooked dried beans; or ?? cup cooked corn or green peas.  ?? Learn how much carbohydrate to eat each day. A dietitian or CDE can teach you how to keep track of the amount of carbohydrate you eat. This is called carbohydrate counting.  ?? Try to eat about the same amount of carbohydrate at each meal. Your dietitian or CDE can tell you how much carbohydrate to eat at each meal and snack. Do not "save up" your daily allowance of carbohydrate to eat at one meal.  ?? If you are not sure how to count carbohydrate grams, use the Plate Method to plan meals. It is a good, quick way to make sure that you have a balanced meal. It also helps you spread carbohydrate throughout the day. Divide your plate by types of foods. Put vegetables on half the plate, meat or other protein food on one-quarter of the plate, and a grain or starchy vegetable (such as brown rice or a potato) in the final quarter of the plate. To this you can add a small piece of fruit and 1 cup of milk or yogurt, depending on how much carbohydrate you are supposed to eat at a meal.  ?? Limit saturated fat, such as the fat from  meat and dairy products. Choose lean cuts of meat and nonfat or low-fat dairy products. Use olive or canola oil instead of butter or shortening when cooking. This is a healthy choice because people who have diabetes are at higher-than-average risk of heart disease.  ?? Do not skip meals. Your blood sugar may drop too low if you skip meals and take certain diabetes pills or insulin.  ?? Work with your doctor to write up a sick-day plan for what to do on days when you are sick. Your blood sugar can go up or down depending on your illness and whether you can keep food down. Call your doctor when you are sick, to see if you need to adjust your pills or insulin.  ?? Check with your doctor before you drink alcohol. Alcohol can cause your blood sugar to drop too low. Alcohol can also cause a bad reaction if you take certain diabetes pills.    Where can you learn more?    Go to [email or web address] and sign in to your MyChart account.    Enter [phone] in the Search Health Information box to learn more about ???Diabetes Diet Guidelines: After Your Visit.???    If you do not have an account, please click on the ???Sign Up Now??? link.    ?? 2006-2012 [employer]. Care instructions adapted under license by [facility]. This care instruction is for use with your licensed healthcare professional. If you have questions about a medical condition or this instruction, always ask your healthcare professional. [employer] disclaims any warranty or liability for your use of this information.  Content Version: 9.4.94723; Last Revised: January 08, 2010                Diabetes Diet Guidelines: After Your Visit  Your Care Instructions  A healthy diet is important to manage diabetes. It helps keep your blood sugar at  a target level (which you set with your doctor). A diet for diabetes does not mean that you have to eat special foods. You can eat what your family eats, including sweets once in a  while. But you do have to pay attention to how often you eat and how much you eat of certain foods.  Managing the amount of carbohydrate you eat is an important part of a healthy diet for diabetes. Carbohydrate is found in sugar and sweets, grains, fruit, starchy vegetables, and milk and yogurt.  You may want to work with a dietitian or a certified diabetes educator (CDE) to help you plan meals and snacks. A dietitian or CDE can also help you lose weight if that is one of your goals.  Follow-up care is a key part of your treatment and safety. Be sure to make and go to all appointments, and call your doctor if you are having problems. It???s also a good idea to know your test results and keep a list of the medicines you take.  How can you care for yourself at home?  ?? Learn which foods have carbohydrate.  ?? Bread, cereal, pasta, and rice have about 15 grams of carbohydrate in a serving. A serving is 1 slice of bread (1 ounce), ?? cup of cooked cereal, or 1/3 cup of cooked pasta or rice.  ?? Fruits have 15 grams of carbohydrate in a serving. A serving is 1 small fresh fruit, such as an apple or orange; ?? of a banana; ?? cup of cooked or canned fruit; ?? cup of fruit juice; 1 cup of melon or raspberries; or 2 tablespoons of dried fruit.  ?? Milk and no-sugar-added yogurt have 15 grams of carbohydrate in a serving. A serving is 1 cup of milk or 2/3 cup of no-sugar-added yogurt.  ?? Starchy vegetables have 15 grams of carbohydrate in a serving. A serving is ?? cup of mashed potatoes or sweet potato; 1 cup winter squash; ?? of a small baked potato; 1/4 cup of cooked dried beans; or ?? cup cooked corn or green peas.  ?? Learn how much carbohydrate to eat each day. A dietitian or CDE can teach you how to keep track of the amount of carbohydrate you eat. This is called carbohydrate counting.  ?? Try to eat about the same amount of carbohydrate at each meal. Your dietitian or CDE can tell you how much carbohydrate to eat at each meal and  snack. Do not "save up" your daily allowance of carbohydrate to eat at one meal.  ?? If you are not sure how to count carbohydrate grams, use the Plate Method to plan meals. It is a good, quick way to make sure that you have a balanced meal. It also helps you spread carbohydrate throughout the day. Divide your plate by types of foods. Put vegetables on half the plate, meat or other protein food on one-quarter of the plate, and a grain or starchy vegetable (such as brown rice or a potato) in the final quarter of the plate. To this you can add a small piece of fruit and 1 cup of milk or yogurt, depending on how much carbohydrate you are supposed to eat at a meal.  ?? Limit saturated fat, such as the fat from meat and dairy products. Choose lean cuts of meat and nonfat or low-fat dairy products. Use olive or canola oil instead of butter or shortening when cooking. This is a healthy choice because people who have diabetes  are at higher-than-average risk of heart disease.  ?? Do not skip meals. Your blood sugar may drop too low if you skip meals and take certain diabetes pills or insulin.  ?? Work with your doctor to write up a sick-day plan for what to do on days when you are sick. Your blood sugar can go up or down depending on your illness and whether you can keep food down. Call your doctor when you are sick, to see if you need to adjust your pills or insulin.  ?? Check with your doctor before you drink alcohol. Alcohol can cause your blood sugar to drop too low. Alcohol can also cause a bad reaction if you take certain diabetes pills.    Where can you learn more?    Go to [email or web address] and sign in to your MyChart account.    Enter [id number] in the Search Health Information box to learn more about ???Diabetes Diet Guidelines: After Your Visit.???    If you do not have an account, please click on the ???Sign Up Now??? link.    ?? 2006-2012 [employer], [employer]. Care instructions adapted under license by  [facility]. This care instruction is for use with your licensed healthcare professional. If you have questions about a medical condition or this instruction, always ask your healthcare professional. [employer] disclaims any warranty or liability for your use of this information.  Content Version: 9.4.94723; Last Revised: January 08, 2010                Learning About Low-Fat Eating  What is low-fat eating?  Most food has some fat in it. Your body needs some fat to be healthy. But some kinds of fats are healthier than others.  In a low-fat eating plan, you try to choose healthier fats and eat fewer unhealthy fats. Healthy fats include olive and canola oil. Try to avoid eating too much saturated fat (such as in cheese and meats) and trans fat (a type of fat found in many packaged snack foods and other baked goods).  You do not need to cut all fat from your diet. But you can make healthier choices about the types and amount of fat you eat.  Even though it is a good idea to choose healthier fats, it is still important to be careful of how much fat you eat, because all fats are high in calories.  What are the different types of fats?  Unhealthy fats  ?? Saturated fat. Saturated fats are mostly in animal foods, such as meat and dairy foods. Tropical oils, such as coconut oil, palm oil, and cocoa butter, are also saturated fats.  ?? Trans fat. Trans fats include shortening, partially hydrogenated vegetable oils, and hydrogenated vegetable oils. Trans fats are made when a liquid fat is turned into a solid fat (for example, when corn oil is made into stick margarine). They are in many processed foods, such as cookies, crackers, and snack foods.  ?? Cholesterol. Cholesterol is only in animal products, such as eggs, dairy foods, and meats.  Healthy fats  ?? Monounsaturated fat. Monounsaturated fats are liquid at room temperature but get solid when refrigerated. Eating foods that are high in this fat  may help lower your "bad" (LDL) cholesterol, raise your "good" (HDL) cholesterol, and lower your chances of getting heart disease. This fat is found in canola oil, olive oil, peanut oil, olives, avocados, nuts, and nut butters.  ?? Polyunsaturated fat. Polyunsaturated fats are liquid at room temperature.  They are in safflower, sunflower, and corn oils. They are also the main fat in seafood. Omega-3 fatty acids are types of polyunsaturated fat. Omega-3 fatty acids may lower your chances of getting heart disease. Fatty fish such as salmon and mackerel contain these healthy fatty acids. So do ground flaxseeds and flaxseed oil, soybeans, walnuts, and seeds.  Why cut down on unhealthy fats?  Eating foods that contain saturated fats can raise the LDL ("bad") cholesterol in your blood. Having a high level of LDL cholesterol increases your chance of clogged arteries (atherosclerosis), which can lead to coronary artery disease and heart attack.  Trans fat raises the level of "bad" LDL cholesterol in your blood and lowers the "good" HDL cholesterol in your blood. HDL cholesterol is important. It helps clear the bad cholesterol from your blood so it does not clog your arteries. A high level of HDL can lower your risk of having a heart attack.  In general:  ?? No more than 10% of your daily calories should come from saturated fat. This is about 20 grams in a 2,000-calorie diet.  ?? No more than 10% of your daily calories should come from polyunsaturated fat. This is about 20 grams in a 2,000-calorie diet.  ?? Monounsaturated fats can be up to 15% of your daily calories. This is about 25 to 30 grams in a 2,000-calorie diet.  If you're not sure how much fat you should be eating or how many calories you need each day to stay at a healthy weight, talk to a registered dietitian. He or she can help you create a plan that's right for you.  What can you do to cut down on fat?  Foods like cheese, butter, sausage, and desserts can have a lot  of unhealthy fats. Try these tips for healthier meals at home and when you eat out.  At home  ?? Fill up on fruits, vegetables, and whole grains.  ?? Think of meat as a side dish instead of as the main part of your meal.  ?? When you do eat meat, make it low-fat ground beef (97% lean), ground [place] breast (without skin added), meats with fat trimmed off before cooking, or skinless chicken.  ?? Try main dishes that use whole wheat pasta, brown rice, dried beans, or vegetables.  ?? Use cooking methods that use little or no fat, such as broiling, steaming, or grilling. Use cooking spray instead of oil. If you use oil, use a monounsaturated oil, such as canola or olive oil.  ?? Read food labels on canned, bottled, or packaged foods. Choose those with little saturated fat and no trans fat.  When eating out at a restaurant  ?? Order foods that are broiled or poached instead of fried or breaded.  ?? Cut back on the amount of butter or margarine that you use on bread. Use small amounts of olive oil instead.  ?? Order sauces, gravies, and salad dressings on the side, and use only a little.  ?? When you order pasta, choose tomato sauce instead of cream sauce.  ?? Ask for salsa with your baked potato instead of sour cream, butter, cheese, or bacon.    Where can you learn more?    Go to [email or web address] and sign in to your MyChart account.    Enter [phone] in the Search Health Information box to learn more about ???Learning About Low-Fat Eating.???    If you do not have an account, please click on the ???Sign Up  Now??? link.    ?? 2006-2012 [employer], [employer]. Care instructions adapted under license by [facility]. This care instruction is for use with your licensed healthcare professional. If you have questions about a medical condition or this instruction, always ask your healthcare professional. [employer] disclaims any warranty or liability for your use of this information.  Content  Version: 9.4.94723; Last Revised: January 10, 2010              Exercise Plan:   Current guidelines suggest you exercise at a moderate degree(such has a brisk walker) for 30 minutes 5 times a week. If you do not currently exercise or have cardiac, pulmonary, or orthopedic , or weight challenges, you may need a modification(speak with your PCP.    Patient will obtain home BP cuff and monitor at least 3x/week.  He will bring BP log  next office visit.

## 2011-06-14 NOTE — Addendum Note (Signed)
Addended by: Mariella Saa on: 06/14/2011 04:59 PM     Modules accepted: Orders

## 2011-06-18 MED ORDER — OMEPRAZOLE 20 MG PO CPDR
20 MG | ORAL_CAPSULE | Freq: Every day | ORAL | Status: DC
Start: 2011-06-18 — End: 2018-09-09

## 2011-06-18 NOTE — Telephone Encounter (Signed)
Scripts called in for glucose monitor, strips,change from dexilant to  Omeprazole.

## 2011-06-18 NOTE — Telephone Encounter (Signed)
prilosec prevacid alternate choices  dexilant needs PA    Also needs glucometer and supplies    [phone] [facility] [address]      Thank you

## 2011-06-18 NOTE — Telephone Encounter (Signed)
Pt requested to have HHA review referral spoke with pt - HHA declined due to no clear treatment plan

## 2011-06-26 NOTE — Progress Notes (Signed)
Test will be done by next office visit.

## 2011-06-26 NOTE — Progress Notes (Signed)
Test is in epic

## 2011-07-17 NOTE — Telephone Encounter (Signed)
Calling in regards to needing the most recent Pre- op History and physical on this pt.   Please advise.

## 2011-07-17 NOTE — Telephone Encounter (Signed)
[facility], [place].    Informed her that pt no-showed on 07/12/11.   Last OV:  06/14/11.  No labs to send, no EKG's, etc.

## 2011-08-26 MED ORDER — METFORMIN HCL 500 MG PO TABS
500 MG | ORAL_TABLET | ORAL | Status: DC
Start: 2011-08-26 — End: 2013-05-10

## 2011-12-31 ENCOUNTER — Ambulatory Visit (HOSPITAL_COMMUNITY)
Admission: RE | Admit: 2011-12-31 | Discharge: 2011-12-31 | Disposition: A | Discharge status: Home / Self Care | Payer: BC Managed Care – PPO | Source: Ambulatory Visit | Attending: Family Medicine | Admitting: Family Medicine

## 2011-12-31 ENCOUNTER — Other Ambulatory Visit (HOSPITAL_COMMUNITY): Payer: Self-pay | Admitting: Family Medicine

## 2011-12-31 DIAGNOSIS — R05 Cough: Secondary | ICD-10-CM

## 2011-12-31 DIAGNOSIS — J3489 Other specified disorders of nose and nasal sinuses: Secondary | ICD-10-CM | POA: Insufficient documentation

## 2011-12-31 DIAGNOSIS — R062 Wheezing: Secondary | ICD-10-CM | POA: Insufficient documentation

## 2011-12-31 DIAGNOSIS — R0602 Shortness of breath: Secondary | ICD-10-CM | POA: Insufficient documentation

## 2011-12-31 DIAGNOSIS — R059 Cough, unspecified: Secondary | ICD-10-CM | POA: Insufficient documentation

## 2012-03-01 ENCOUNTER — Encounter (HOSPITAL_COMMUNITY): Payer: Self-pay | Admitting: Emergency Medicine

## 2012-03-01 ENCOUNTER — Emergency Department (HOSPITAL_COMMUNITY)
Admission: EM | Admit: 2012-03-01 | Discharge: 2012-03-01 | Disposition: A | Discharge status: Home / Self Care | Payer: BC Managed Care – PPO | Attending: Emergency Medicine | Admitting: Emergency Medicine

## 2012-03-01 DIAGNOSIS — I10 Essential (primary) hypertension: Secondary | ICD-10-CM | POA: Insufficient documentation

## 2012-03-01 DIAGNOSIS — Z9089 Acquired absence of other organs: Secondary | ICD-10-CM | POA: Insufficient documentation

## 2012-03-01 DIAGNOSIS — E119 Type 2 diabetes mellitus without complications: Secondary | ICD-10-CM | POA: Insufficient documentation

## 2012-03-01 DIAGNOSIS — F17200 Nicotine dependence, unspecified, uncomplicated: Secondary | ICD-10-CM | POA: Insufficient documentation

## 2012-03-01 DIAGNOSIS — J449 Chronic obstructive pulmonary disease, unspecified: Secondary | ICD-10-CM | POA: Insufficient documentation

## 2012-03-01 DIAGNOSIS — R300 Dysuria: Secondary | ICD-10-CM | POA: Insufficient documentation

## 2012-03-01 DIAGNOSIS — R109 Unspecified abdominal pain: Secondary | ICD-10-CM | POA: Insufficient documentation

## 2012-03-01 DIAGNOSIS — J4489 Other specified chronic obstructive pulmonary disease: Secondary | ICD-10-CM | POA: Insufficient documentation

## 2012-03-01 DIAGNOSIS — Z79899 Other long term (current) drug therapy: Secondary | ICD-10-CM | POA: Insufficient documentation

## 2012-03-01 HISTORY — DX: Chronic obstructive pulmonary disease, unspecified: J44.9

## 2012-03-01 LAB — URINALYSIS, ROUTINE W REFLEX MICROSCOPIC
Glucose, UA: NEGATIVE mg/dL
Hgb urine dipstick: NEGATIVE
Leukocytes, UA: NEGATIVE
Specific Gravity, Urine: 1.024 (ref 1.005–1.030)
Urobilinogen, UA: 1 mg/dL (ref 0.0–1.0)

## 2012-03-01 LAB — BASIC METABOLIC PANEL
GFR calc non Af Amer: 69 mL/min — ABNORMAL LOW (ref >90)
Glucose, Bld: 101 mg/dL — ABNORMAL HIGH (ref 70–99)
Potassium: 4.2 mEq/L (ref 3.5–5.1)
Sodium: 138 mEq/L (ref 135–145)

## 2012-03-01 NOTE — ED Notes (Signed)
Pt presents w/ possible urinary retention, pt states thinks he voided normal amount this a.m. Having perineal and rectal pressure.

## 2012-03-01 NOTE — ED Provider Notes (Signed)
History     CSN: [id number]  Arrival date & time 03/01/12  1029   First MD Initiated Contact with Patient 03/01/12 1104      Chief Complaint  Patient presents with  . Urinary Retention    (Consider location/radiation/quality/duration/timing/severity/associated sxs/prior treatment) HPI Comments: Pt states that he has been having dribbling and lower abdominal pressure intermittently over the last month:pt states that he saw his doctor 4 days ago because the symptoms were worsening:pt states that he came in today because his friends told him he may have a prostatitis:pt states that he is not having fever or penile discharge:pt states that he started flomax the other day and it doesn't seem to be helping:denies vomiting and diarrhea:pt states that he urinated the normal amount this morning and he feels like he can give a urine specimen now if needed  The history is provided by the patient. No language interpreter was used.    Past Medical History  Diagnosis Date  . Diabetes mellitus   . Hypertension   . COPD (chronic obstructive pulmonary disease)     Past Surgical History  Procedure Date  . Appendectomy   . Hemorrhoid surgery   . Hernia repair     RLQ, umbilical    No family history on file.  History  Substance Use Topics  . Smoking status: Current Every Day Smoker -- .1 years    Types: Cigarettes  . Smokeless tobacco: Never Used  . Alcohol Use: No      Review of Systems  Constitutional: Negative.   Respiratory: Negative.   Cardiovascular: Negative.     Allergies  Review of patient's allergies indicates no known allergies.  Home Medications   Current Outpatient Rx  Name  Route  Sig  Dispense  Refill  . ALBUTEROL SULFATE 2 MG PO TABS   Oral   Take 2 mg by mouth 3 (three) times daily.         [name] AMLODIPINE BESYLATE 5 MG PO TABS   Oral   Take 5 mg by mouth daily.         . ENALAPRIL MALEATE 5 MG PO TABS   Oral   Take 5 mg by mouth 2 (two) times  daily.         . ADULT MULTIVITAMIN W/MINERALS CH   Oral   Take 1 tablet by mouth daily.         [name] TAMSULOSIN HCL 0.4 MG PO CAPS   Oral   Take 0.4 mg by mouth daily.           BP 125/89  Pulse 50  Temp 99.1 F (37.3 C) (Oral)  SpO2 98%  Physical Exam  Nursing note and vitals reviewed. Constitutional: He is oriented to person, place, and time. He appears well-developed and well-nourished.  HENT:  Head: Normocephalic and atraumatic.  Eyes: Conjunctivae normal and EOM are normal.  Neck: Neck supple.  Cardiovascular: Normal rate and regular rhythm.   Pulmonary/Chest: Effort normal and breath sounds normal.  Abdominal: Soft. Bowel sounds are normal. There is no tenderness.  Musculoskeletal: Normal range of motion.  Neurological: He is alert and oriented to person, place, and time.  Skin: Skin is warm and dry.  Psychiatric: He has a normal mood and affect.    ED Course  Procedures (including critical care time)  Labs Reviewed  BASIC METABOLIC PANEL - Abnormal; Notable for the following:    Glucose, Bld 101 (*)     GFR calc non Af  Amer 69 (*)     GFR calc Af Amer 80 (*)     All other components within normal limits  URINALYSIS, ROUTINE W REFLEX MICROSCOPIC   No results found.   1. Dysuria       MDM  No sign of infection noted:pt okay to follow up with urology         Teressa Lower, NP 03/01/12 1520

## 2012-03-03 NOTE — ED Provider Notes (Signed)
Medical screening examination/treatment/procedure(s) were performed by non-physician practitioner and as supervising physician I was immediately available for consultation/collaboration.   Doyce Stonehouse E Shatika Grinnell, MD 03/03/12 1835 

## 2012-03-11 ENCOUNTER — Telehealth: Payer: Self-pay

## 2012-03-11 NOTE — Telephone Encounter (Signed)
Pt was referred by Dr. Regino Schultze for screening colonoscopy. Called and he was walking out of the house, said to call back Friday or next week.

## 2012-03-17 NOTE — Telephone Encounter (Signed)
LMOM to call.

## 2012-03-24 NOTE — Telephone Encounter (Signed)
Letter to PCP

## 2012-04-07 ENCOUNTER — Ambulatory Visit: Payer: BC Managed Care – PPO | Admitting: Cardiology

## 2012-06-16 ENCOUNTER — Inpatient Hospital Stay: Admit: 2012-06-17 | Payer: PRIVATE HEALTH INSURANCE

## 2012-06-16 DIAGNOSIS — Z111 Encounter for screening for respiratory tuberculosis: Secondary | ICD-10-CM

## 2012-07-06 ENCOUNTER — Ambulatory Visit (HOSPITAL_COMMUNITY)
Admission: RE | Admit: 2012-07-06 | Discharge: 2012-07-06 | Disposition: A | Discharge status: Home / Self Care | Payer: BC Managed Care – PPO | Source: Ambulatory Visit | Attending: Family Medicine | Admitting: Family Medicine

## 2012-07-06 ENCOUNTER — Other Ambulatory Visit (HOSPITAL_COMMUNITY): Payer: Self-pay | Admitting: Family Medicine

## 2012-07-06 DIAGNOSIS — J4489 Other specified chronic obstructive pulmonary disease: Secondary | ICD-10-CM | POA: Insufficient documentation

## 2012-07-06 DIAGNOSIS — J449 Chronic obstructive pulmonary disease, unspecified: Secondary | ICD-10-CM | POA: Insufficient documentation

## 2012-07-06 DIAGNOSIS — R6889 Other general symptoms and signs: Secondary | ICD-10-CM | POA: Insufficient documentation

## 2012-07-06 DIAGNOSIS — R059 Cough, unspecified: Secondary | ICD-10-CM | POA: Insufficient documentation

## 2012-07-06 DIAGNOSIS — R05 Cough: Secondary | ICD-10-CM | POA: Insufficient documentation

## 2012-10-20 ENCOUNTER — Encounter: Payer: Self-pay | Admitting: *Deleted

## 2012-11-05 ENCOUNTER — Ambulatory Visit (INDEPENDENT_AMBULATORY_CARE_PROVIDER_SITE_OTHER): Payer: BC Managed Care – PPO | Admitting: Adult Health

## 2012-11-05 ENCOUNTER — Encounter: Payer: Self-pay | Admitting: Adult Health

## 2012-11-05 VITALS — BP 133/78 | HR 76 | Ht 74.0 in | Wt 262.0 lb

## 2012-11-05 DIAGNOSIS — R079 Chest pain, unspecified: Secondary | ICD-10-CM

## 2012-11-05 NOTE — Patient Instructions (Signed)
Your physician recommends that you schedule a follow-up appointment in: As Needed    

## 2012-11-05 NOTE — Addendum Note (Signed)
Addended by: Derry Lory A on: 11/05/2012 02:39 PM   Modules accepted: Orders

## 2012-11-05 NOTE — Assessment & Plan Note (Signed)
No complaints of chest pain or DOE at this time. He is working out at [employer] and is asymptomatic.  He is medically compliant. Will see him on PRN basis only. He is to continue current medication regimen and follow up with PCP for labs and management. We are happy to see him again at any time should cardiac issue become apparent.

## 2012-11-05 NOTE — Progress Notes (Signed)
Name: Daniel [name]    DOB: Oct 17, 1955  Age: 57 y.o.  MR#: [id number]       PCP:  Colette Ribas, MD      Insurance: Payor: BLUE CROSS BLUE SHIELD / Plan: BCBS  PPO / Product Type: *No Product type* /   CC:    Chief Complaint  Patient presents with  . Chest Pain    VS Filed Vitals:   11/05/12 1308  BP: 133/78  Pulse: 76  Height: 6\' 2"  (1.88 m)  Weight: 262 lb (118.842 kg)    Weights Current Weight  11/05/12 262 lb (118.842 kg)  03/29/11 228 lb (103.42 kg)  03/15/11 227 lb (102.967 kg)    Blood Pressure  BP Readings from Last 3 Encounters:  11/05/12 133/78  03/01/12 125/89  03/29/11 136/87     Admit date:  (Not on file) Last encounter with RMR:  Visit date not found   Allergy Review of patient's allergies indicates no known allergies.  Current Outpatient Prescriptions  Medication Sig Dispense Refill  . albuterol (PROVENTIL) 2 MG tablet Take 2 mg by mouth 3 (three) times daily.      [name] amLODipine (NORVASC) 5 MG tablet Take 5 mg by mouth daily.      . Multiple Vitamin (MULITIVITAMIN WITH MINERALS) TABS Take 1 tablet by mouth daily.      . Tamsulosin HCl (FLOMAX) 0.4 MG CAPS Take 0.4 mg by mouth daily.      . enalapril (VASOTEC) 5 MG tablet Take 5 mg by mouth 2 (two) times daily.       No current facility-administered medications for this visit.    Discontinued Meds:   There are no discontinued medications.  Patient Active Problem List   Diagnosis Date Noted  . Hypertriglyceridemia without hypercholesterolemia 03/12/2011  . Chest pain 03/06/2011  . Anxiety 03/06/2011  . Obesity (BMI 30-39.9) 03/06/2011  . Pre-diabetes 03/06/2011    LABS    Component Value Date/Time   NA 138 03/01/2012 1146   NA 142 03/13/2011 0936   NA 139 03/07/2011 0527   K 4.2 03/01/2012 1146   K 4.3 03/13/2011 0936   K 4.0 03/07/2011 0527   CL 102 03/01/2012 1146   CL 102 03/13/2011 0936   CL 106 03/07/2011 0527   CO2 26 03/01/2012 1146   CO2 30 03/13/2011 0936   CO2 27 03/07/2011 0527   GLUCOSE 101* 03/01/2012 1146   GLUCOSE 78 03/13/2011 0936   GLUCOSE 113* 03/07/2011 0527   BUN 12 03/01/2012 1146   BUN 17 03/13/2011 0936   BUN 13 03/07/2011 0527   CREATININE 1.16 03/01/2012 1146   CREATININE 1.24 03/13/2011 0936   CREATININE 0.86 03/07/2011 0527   CREATININE 0.99 03/06/2011 0812   CALCIUM 9.1 03/01/2012 1146   CALCIUM 9.6 03/13/2011 0936   CALCIUM 9.4 03/07/2011 0527   GFRNONAA 69* 03/01/2012 1146   GFRNONAA >90 03/07/2011 0527   GFRNONAA >90 03/06/2011 0812   GFRAA 80* 03/01/2012 1146   GFRAA >90 03/07/2011 0527   GFRAA >90 03/06/2011 0812   CMP     Component Value Date/Time   NA 138 03/01/2012 1146   K 4.2 03/01/2012 1146   CL 102 03/01/2012 1146   CO2 26 03/01/2012 1146   GLUCOSE 101* 03/01/2012 1146   BUN 12 03/01/2012 1146   CREATININE 1.16 03/01/2012 1146   CREATININE 1.24 03/13/2011 0936   CALCIUM 9.1 03/01/2012 1146   GFRNONAA 69* 03/01/2012 1146   GFRAA 80* 03/01/2012 1146  Component Value Date/Time   WBC 5.1 03/14/2011 0845   WBC 4.7 03/07/2011 0527   WBC 5.3 03/06/2011 0812   HGB 16.3 03/14/2011 0845   HGB 14.4 03/07/2011 0527   HGB 15.6 03/06/2011 0812   HCT 48.8 03/14/2011 0845   HCT 45.4 03/07/2011 0527   HCT 46.0 03/06/2011 0812   MCV 96.8 03/14/2011 0845   MCV 97.4 03/07/2011 0527   MCV 96.8 03/06/2011 0812    Lipid Panel     Component Value Date/Time   CHOL 143 03/06/2011 1840   TRIG 296* 03/06/2011 1840   HDL 38* 03/06/2011 1840   CHOLHDL 3.8 03/06/2011 1840   VLDL 59* 03/06/2011 1840   LDLCALC 46 03/06/2011 1840    ABG No results found for this basename: phart, pco2, pco2art, po2, po2art, hco3, tco2, acidbasedef, o2sat     No results found for this basename: TSH   BNP (last 3 results) No results found for this basename: PROBNP,  in the last 8760 hours Cardiac Panel (last 3 results) No results found for this basename: CKTOTAL, CKMB, TROPONINI, RELINDX,  in the last 72 hours  Iron/TIBC/Ferritin No results found for this basename: iron, tibc, ferritin     EKG Orders placed during the  hospital encounter of 03/06/11  . ED EKG  . ED EKG  . EKG 12-LEAD  . EKG 12-LEAD  . EKG     Prior Assessment and Plan Problem List as of 11/05/2012     Endocrine   Pre-diabetes     Other   Chest pain   Last Assessment & Plan   03/29/2011 Office Visit Written 03/29/2011  5:23 PM by Jodelle Gross, NP     Cardiac cath is reassuring. He is asymptomatic. I have congratulated him on his wt loss, smoking cessation and healthy lifestyle. He should continue this for overall health status. He will follow-up with his PCP for continued assessment of cholesterol status. We will see him on prn basis.    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

## 2012-11-05 NOTE — Progress Notes (Signed)
    HPI: [name] is a 57 year old former patient of Daniel [name] we are following for ongoing assessment and management of recurrent chest pain with history of hypertension,COPD, and diabetes.. The patient had a cardiac catheterization in 2013 demonstrating no angiographic coronary artery disease. His chest pain was noncardiac in etiology.    He has been working out, trying to lose weight and eating right. He is medically compliant and without complaints of chest pain.   No Known Allergies  Current Outpatient Prescriptions  Medication Sig Dispense Refill  . albuterol (PROVENTIL) 2 MG tablet Take 2 mg by mouth 3 (three) times daily.      [name] amLODipine (NORVASC) 5 MG tablet Take 5 mg by mouth daily.      . Multiple Vitamin (MULITIVITAMIN WITH MINERALS) TABS Take 1 tablet by mouth daily.      . Tamsulosin HCl (FLOMAX) 0.4 MG CAPS Take 0.4 mg by mouth daily.      . enalapril (VASOTEC) 5 MG tablet Take 5 mg by mouth 2 (two) times daily.       No current facility-administered medications for this visit.    Past Medical History  Diagnosis Date  . Diabetes mellitus   . Hypertension   . COPD (chronic obstructive pulmonary disease)     Past Surgical History  Procedure Laterality Date  . Appendectomy    . Hemorrhoid surgery    . Hernia repair      RLQ, umbilical    GUY:QIHKVQ of systems complete and found to be negative unless listed above  PHYSICAL EXAM BP 133/78  Pulse 76  Ht 6\' 2"  (1.88 m)  Wt 262 lb (118.842 kg)  BMI 33.62 kg/m2  General: Well developed, well nourished, in no acute distress Head: Eyes PERRLA, No xanthomas.   Normal cephalic and atramatic  Lungs: Some bibasilar crackles without wheezes. Heart: HRRR S1 S2, without MRG.  Pulses are 2+ & equal.            No carotid bruit. No JVD.  No abdominal bruits. No femoral bruits. Abdomen: Bowel sounds are positive, abdomen soft,, obese, and non-tender without masses or                  Hernia's noted. Msk:  Back  normal, normal gait. Normal strength and tone for age. Extremities: No clubbing, cyanosis or edema.  DP +1 Neuro: Alert and oriented X 3. Psych:  Good affect, responds appropriately  EKG: NSR  ASSESSMENT AND PLAN

## 2012-12-20 ENCOUNTER — Encounter (HOSPITAL_COMMUNITY): Payer: Self-pay | Admitting: Emergency Medicine

## 2012-12-20 ENCOUNTER — Inpatient Hospital Stay (HOSPITAL_COMMUNITY)
Admission: EM | Admit: 2012-12-20 | Discharge: 2012-12-23 | DRG: 190 | Disposition: A | Discharge status: Home / Self Care | Payer: BC Managed Care – PPO | Attending: Family Medicine | Admitting: Family Medicine

## 2012-12-20 ENCOUNTER — Emergency Department (HOSPITAL_COMMUNITY): Payer: BC Managed Care – PPO

## 2012-12-20 DIAGNOSIS — Z8249 Family history of ischemic heart disease and other diseases of the circulatory system: Secondary | ICD-10-CM

## 2012-12-20 DIAGNOSIS — J9600 Acute respiratory failure, unspecified whether with hypoxia or hypercapnia: Secondary | ICD-10-CM | POA: Diagnosis present

## 2012-12-20 DIAGNOSIS — F419 Anxiety disorder, unspecified: Secondary | ICD-10-CM

## 2012-12-20 DIAGNOSIS — E669 Obesity, unspecified: Secondary | ICD-10-CM

## 2012-12-20 DIAGNOSIS — F17200 Nicotine dependence, unspecified, uncomplicated: Secondary | ICD-10-CM | POA: Diagnosis present

## 2012-12-20 DIAGNOSIS — R7303 Prediabetes: Secondary | ICD-10-CM

## 2012-12-20 DIAGNOSIS — J441 Chronic obstructive pulmonary disease with (acute) exacerbation: Secondary | ICD-10-CM | POA: Diagnosis present

## 2012-12-20 DIAGNOSIS — I10 Essential (primary) hypertension: Secondary | ICD-10-CM | POA: Diagnosis present

## 2012-12-20 LAB — CBC
Hemoglobin: 15.4 g/dL (ref 13.0–17.0)
MCH: 33 pg (ref 26.0–34.0)
RBC: 4.66 MIL/uL (ref 4.22–5.81)
WBC: 6.6 10*3/uL (ref 4.0–10.5)

## 2012-12-20 LAB — BASIC METABOLIC PANEL
CO2: 31 mEq/L (ref 19–32)
Calcium: 9.4 mg/dL (ref 8.4–10.5)
Chloride: 99 mEq/L (ref 96–112)
Glucose, Bld: 114 mg/dL — ABNORMAL HIGH (ref 70–99)
Potassium: 3.7 mEq/L (ref 3.5–5.1)
Sodium: 136 mEq/L (ref 135–145)

## 2012-12-20 MED ORDER — PREDNISONE 50 MG PO TABS
60.0000 mg | ORAL_TABLET | Freq: Once | ORAL | Status: AC
  Administered 2012-12-20: 60 mg via ORAL
  Filled 2012-12-20 (×2): qty 1

## 2012-12-20 MED ORDER — ALBUTEROL SULFATE (5 MG/ML) 0.5% IN NEBU
INHALATION_SOLUTION | RESPIRATORY_TRACT | Status: AC
  Filled 2012-12-20: qty 1

## 2012-12-20 MED ORDER — IPRATROPIUM BROMIDE 0.02 % IN SOLN
0.5000 mg | Freq: Once | RESPIRATORY_TRACT | Status: AC
  Administered 2012-12-20: 0.5 mg via RESPIRATORY_TRACT
  Filled 2012-12-20: qty 2.5

## 2012-12-20 MED ORDER — ALBUTEROL SULFATE (5 MG/ML) 0.5% IN NEBU
5.0000 mg | INHALATION_SOLUTION | Freq: Once | RESPIRATORY_TRACT | Status: AC
  Administered 2012-12-20: 5 mg via RESPIRATORY_TRACT

## 2012-12-20 MED ORDER — IPRATROPIUM BROMIDE 0.02 % IN SOLN
RESPIRATORY_TRACT | Status: AC
  Filled 2012-12-20: qty 2.5

## 2012-12-20 MED ORDER — ALBUTEROL SULFATE (5 MG/ML) 0.5% IN NEBU
5.0000 mg | INHALATION_SOLUTION | Freq: Once | RESPIRATORY_TRACT | Status: AC
  Administered 2012-12-20: 5 mg via RESPIRATORY_TRACT
  Filled 2012-12-20: qty 1

## 2012-12-20 MED ORDER — IPRATROPIUM BROMIDE 0.02 % IN SOLN
0.5000 mg | Freq: Once | RESPIRATORY_TRACT | Status: AC
  Administered 2012-12-20: 0.5 mg via RESPIRATORY_TRACT

## 2012-12-20 NOTE — ED Provider Notes (Signed)
CSN: [id number]     Arrival date & time 12/20/12  2228 History  This chart was scribed for Sunnie Nielsen, MD by Quintella Reichert, ED scribe.  This patient was seen in room APA05/[facility]05 and the patient's care was started at 11:00 PM.   Chief Complaint  Patient presents with  . Respiratory Distress    Patient is a 57 y.o. male presenting with shortness of breath. The history is provided by the patient. No language interpreter was used.  Shortness of Breath Severity:  Moderate Onset quality:  Sudden Duration:  2 days Timing:  Constant Progression:  Worsening Chronicity:  New Context comment:  Sick contact Relieved by:  Nothing Worsened by:  Nothing tried Ineffective treatments:  Inhaler Associated symptoms: cough, fever, sputum production and wheezing   Associated symptoms: no chest pain and no hemoptysis   Cough:    Sputum characteristics: "getting green", not bloody.   HPI Comments: Daniel [name] is a 57 y.o. male with h/o COPD who presents to the Emergency Department complaining of moderate, worsening difficulty breathing for the past 2 days.  Pt also complains of associated congestion and cough productive of sputum that he states is "getting green."  He denies blood in sputum.  He denies CP.  He also notes a fever up to 101 F.  He took Advil pta.  On arrival temperature is 37 F.  He also complains of mild bilateral lower extremity swelling which is new for him although he notes he has spent a large amount of time on his feet recently.  He has attempted to treat symptoms with albuterol inhaler, without relief.  He notes that he was recently exposed to a coworker with a possible viral URI.  Pt smokes 3 cigarettes/day.   Past Medical History  Diagnosis Date  . Hypertension   . COPD (chronic obstructive pulmonary disease)     Past Surgical History  Procedure Laterality Date  . Appendectomy    . Hemorrhoid surgery    . Hernia repair      RLQ, umbilical    Family History   Problem Relation Age of Onset  . Seizures Mother   . Hypertension Mother   . COPD Father      History  Substance Use Topics  . Smoking status: Current Every Day Smoker -- 0.30 packs/day for 40 years    Types: Cigarettes  . Smokeless tobacco: Never Used  . Alcohol Use: No     Review of Systems  Constitutional: Positive for fever.  Respiratory: Positive for cough, sputum production, shortness of breath and wheezing. Negative for hemoptysis.   Cardiovascular: Negative for chest pain.  All other systems reviewed and are negative.     Allergies  Review of patient's allergies indicates no known allergies.  Home Medications   Current Outpatient Rx  Name  Route  Sig  Dispense  Refill  . albuterol (PROVENTIL) 2 MG tablet   Oral   Take 2 mg by mouth 3 (three) times daily.         [name] amLODipine (NORVASC) 5 MG tablet   Oral   Take 5 mg by mouth daily.         [name] EXPIRED: enalapril (VASOTEC) 5 MG tablet   Oral   Take 5 mg by mouth 2 (two) times daily.         . Multiple Vitamin (MULITIVITAMIN WITH MINERALS) TABS   Oral   Take 1 tablet by mouth daily.         [name]  Tamsulosin HCl (FLOMAX) 0.4 MG CAPS   Oral   Take 0.4 mg by mouth daily.          BP 153/94  Pulse 72  Temp(Src) 98 F (36.7 C) (Oral)  Resp 24  Ht 6\' 2"  (1.88 m)  Wt 270 lb (122.471 kg)  BMI 34.65 kg/m2  SpO2 97%  Physical Exam  Nursing note and vitals reviewed. Constitutional: He is oriented to person, place, and time. He appears well-developed and well-nourished.  HENT:  Head: Normocephalic and atraumatic.  Eyes: EOM are normal.  Neck: Normal range of motion.  Cardiovascular: Normal rate, regular rhythm, normal heart sounds and intact distal pulses.   Pulmonary/Chest: Effort normal. No respiratory distress. He has wheezes. He has no rales.  Bilateral expiratory wheezes  Abdominal: Soft. He exhibits no distension. There is no tenderness.  Musculoskeletal: Normal range of motion. He  exhibits edema.  Trace pretibial edema bilaterally.  Calves nontender.  Neurological: He is alert and oriented to person, place, and time.  Skin: Skin is warm and dry.  Psychiatric: He has a normal mood and affect. Judgment normal.    ED Course  Procedures (including critical care time)  DIAGNOSTIC STUDIES: Oxygen Saturation is 85% room air, hypoxic by my interpretation.    COORDINATION OF CARE: 11:03 PM: Discussed treatment plan which includes breathing treatment, steroids, CXR and labs.  Pt expressed understanding and agreed to plan.   Labs Review Labs Reviewed  BASIC METABOLIC PANEL - Abnormal; Notable for the following:    Glucose, Bld 114 (*)    GFR calc non Af Amer 81 (*)    All other components within normal limits  CBC  PRO B NATRIURETIC PEPTIDE     Imaging Review Dg Chest 2 View  12/20/2012   CLINICAL DATA:  Productive cough. Fever. Shortness of breath. COPD.  EXAM: CHEST  2 VIEW  COMPARISON:  None.  FINDINGS: The heart size and mediastinal contours are within normal limits. Both lungs are clear. Pulmonary hyperinflation again seen, consistent with COPD. No mass or lymphadenopathy identified. The visualized skeletal structures are unremarkable.  IMPRESSION: Stable exam.  COPD.  No active disease.   Electronically Signed   By: Myles Rosenthal M.D.   On: 12/20/2012 23:31    EKG Interpretation    Date/Time:  Sunday December 20 2012 22:45:24 EST Ventricular Rate:  72 PR Interval:  122 QRS Duration: 84 QT Interval:  380 QTC Calculation: 416 R Axis:   51 Text Interpretation:  Normal sinus rhythm Nonspecific ST abnormality Baseline wander Confirmed by Elza Sortor  MD, Demetrias Goodbar ([id number]) on 12/21/2012 12:25:08 AM           Steroids and albuterol treatments provided  After 3 breathing treatments, still has sig wheezing bilateral and minimal subjective improvement. MEd consulted for admit.  D/w Dr Sharl Ma.  Abx provided.    MDM  Dx: COPD exacerbation  CXR reviewed - no  infiltrate Labs reviewed no leukocytosis.  Screening ECG - no ischemia Oxygen sats improved with supp O2 MED admit  I personally performed the services described in this documentation, which was scribed in my presence. The recorded information has been reviewed and is accurate.     Sunnie Nielsen, MD 12/21/12 [phone]

## 2012-12-20 NOTE — ED Notes (Signed)
Pt c/o SOB. Has COPD. Pt wheezing in Triage.

## 2012-12-21 ENCOUNTER — Encounter (HOSPITAL_COMMUNITY): Payer: Self-pay | Admitting: Emergency Medicine

## 2012-12-21 DIAGNOSIS — F411 Generalized anxiety disorder: Secondary | ICD-10-CM

## 2012-12-21 DIAGNOSIS — R7309 Other abnormal glucose: Secondary | ICD-10-CM

## 2012-12-21 DIAGNOSIS — J441 Chronic obstructive pulmonary disease with (acute) exacerbation: Principal | ICD-10-CM | POA: Diagnosis present

## 2012-12-21 DIAGNOSIS — E669 Obesity, unspecified: Secondary | ICD-10-CM

## 2012-12-21 LAB — PRO B NATRIURETIC PEPTIDE: Pro B Natriuretic peptide (BNP): 46 pg/mL (ref 0–125)

## 2012-12-21 MED ORDER — SODIUM CHLORIDE 0.9 % IJ SOLN
3.0000 mL | Freq: Two times a day (BID) | INTRAMUSCULAR | Status: DC
  Administered 2012-12-21 – 2012-12-22 (×3): 3 mL via INTRAVENOUS

## 2012-12-21 MED ORDER — AMLODIPINE BESYLATE 5 MG PO TABS
5.0000 mg | ORAL_TABLET | Freq: Every day | ORAL | Status: DC
  Administered 2012-12-21 – 2012-12-23 (×3): 5 mg via ORAL
  Filled 2012-12-21 (×3): qty 1

## 2012-12-21 MED ORDER — ALBUTEROL SULFATE (5 MG/ML) 0.5% IN NEBU
2.5000 mg | INHALATION_SOLUTION | Freq: Four times a day (QID) | RESPIRATORY_TRACT | Status: DC
  Administered 2012-12-21 – 2012-12-23 (×10): 2.5 mg via RESPIRATORY_TRACT
  Filled 2012-12-21 (×8): qty 0.5

## 2012-12-21 MED ORDER — ENALAPRIL MALEATE 5 MG PO TABS
5.0000 mg | ORAL_TABLET | Freq: Two times a day (BID) | ORAL | Status: DC
  Administered 2012-12-21 – 2012-12-23 (×5): 5 mg via ORAL
  Filled 2012-12-21 (×5): qty 1

## 2012-12-21 MED ORDER — ALBUTEROL SULFATE (5 MG/ML) 0.5% IN NEBU
2.5000 mg | INHALATION_SOLUTION | RESPIRATORY_TRACT | Status: DC | PRN
  Administered 2012-12-21: 2.5 mg via RESPIRATORY_TRACT

## 2012-12-21 MED ORDER — SODIUM CHLORIDE 0.9 % IV SOLN: 250.0000 mL | INTRAVENOUS | Status: DC | PRN

## 2012-12-21 MED ORDER — IPRATROPIUM BROMIDE 0.02 % IN SOLN
0.5000 mg | Freq: Four times a day (QID) | RESPIRATORY_TRACT | Status: DC
  Administered 2012-12-21 – 2012-12-23 (×10): 0.5 mg via RESPIRATORY_TRACT
  Filled 2012-12-21 (×8): qty 2.5

## 2012-12-21 MED ORDER — LEVOFLOXACIN 750 MG PO TABS
750.0000 mg | ORAL_TABLET | Freq: Once | ORAL | Status: AC
  Administered 2012-12-21: 750 mg via ORAL
  Filled 2012-12-21: qty 1

## 2012-12-21 MED ORDER — TAMSULOSIN HCL 0.4 MG PO CAPS
0.4000 mg | ORAL_CAPSULE | Freq: Every day | ORAL | Status: DC
  Administered 2012-12-21 – 2012-12-23 (×3): 0.4 mg via ORAL
  Filled 2012-12-21 (×3): qty 1

## 2012-12-21 MED ORDER — SODIUM CHLORIDE 0.9 % IJ SOLN: 3.0000 mL | INTRAMUSCULAR | Status: DC | PRN

## 2012-12-21 MED ORDER — ALBUTEROL SULFATE (5 MG/ML) 0.5% IN NEBU
INHALATION_SOLUTION | RESPIRATORY_TRACT | Status: AC
  Filled 2012-12-21: qty 0.5

## 2012-12-21 MED ORDER — LEVOFLOXACIN 750 MG PO TABS
750.0000 mg | ORAL_TABLET | Freq: Every day | ORAL | Status: DC
  Administered 2012-12-21 – 2012-12-22 (×2): 750 mg via ORAL
  Filled 2012-12-21 (×2): qty 1

## 2012-12-21 MED ORDER — ALBUTEROL SULFATE (5 MG/ML) 0.5% IN NEBU: 2.5000 mg | INHALATION_SOLUTION | RESPIRATORY_TRACT | Status: DC | PRN

## 2012-12-21 MED ORDER — METHYLPREDNISOLONE SODIUM SUCC 125 MG IJ SOLR
60.0000 mg | Freq: Four times a day (QID) | INTRAMUSCULAR | Status: DC
  Administered 2012-12-21 – 2012-12-23 (×9): 60 mg via INTRAVENOUS
  Filled 2012-12-21 (×10): qty 2

## 2012-12-21 MED ORDER — ENOXAPARIN SODIUM 40 MG/0.4ML ~~LOC~~ SOLN
40.0000 mg | SUBCUTANEOUS | Status: DC
  Administered 2012-12-21 – 2012-12-22 (×2): 40 mg via SUBCUTANEOUS
  Filled 2012-12-21 (×3): qty 0.4

## 2012-12-21 NOTE — H&P (Signed)
PCP:   Colette Ribas, MD   Chief Complaint:  Dyspnea  HPI: 57 year old male came with worsening shortness of breath for past 2 days. Patient also had cough with expectoration, the phlegm is green-colored along with fever. Denies chest pain no hemoptysis. Patient says that he was exposed to sick contacts at his work place.  Allergies:  No Known Allergies    Past Medical History  Diagnosis Date  . Hypertension   . COPD (chronic obstructive pulmonary disease)     Past Surgical History  Procedure Laterality Date  . Appendectomy    . Hemorrhoid surgery    . Hernia repair      RLQ, umbilical    Prior to Admission medications   Medication Sig Start Date End Date Taking? Authorizing Provider  albuterol (PROVENTIL) 2 MG tablet Take 2 mg by mouth 3 (three) times daily.    Historical Provider, MD  amLODipine (NORVASC) 5 MG tablet Take 5 mg by mouth daily.    Historical Provider, MD  enalapril (VASOTEC) 5 MG tablet Take 5 mg by mouth 2 (two) times daily. 03/12/11 03/11/12  Jodelle Gross, NP  Multiple Vitamin (MULITIVITAMIN WITH MINERALS) TABS Take 1 tablet by mouth daily.    Historical Provider, MD  Tamsulosin HCl (FLOMAX) 0.4 MG CAPS Take 0.4 mg by mouth daily.    Historical Provider, MD    Social History:  reports that he has been smoking Cigarettes.  He has a 12 pack-year smoking history. He has never used smokeless tobacco. He reports that he does not drink alcohol or use illicit drugs.  Family History  Problem Relation Age of Onset  . Seizures Mother   . Hypertension Mother   . COPD Father      All the positives are listed in BOLD  Review of Systems:  HEENT: Headache, blurred vision, runny nose, sore throat Neck: Hypothyroidism, hyperthyroidism,,lymphadenopathy Chest : Shortness of breath, history of COPD, Asthma Heart : Chest pain, history of coronary arterey disease GI:  Nausea, vomiting, diarrhea, constipation, GERD GU: Dysuria, urgency, frequency of  urination, hematuria Neuro: Stroke, seizures, syncope Psych: Depression, anxiety, hallucinations   Physical Exam: Blood pressure 150/87, pulse 64, temperature 98.2 F (36.8 C), temperature source Oral, resp. rate 20, height 6\' 2"  (1.88 m), weight 123.378 kg (272 lb), SpO2 96.00%. Constitutional:   Patient is a well-developed and well-nourished male* in no acute distress and cooperative with exam. Head: Normocephalic and atraumatic Mouth: Mucus membranes moist Eyes: PERRL, EOMI, conjunctivae normal Neck: Supple, No Thyromegaly Cardiovascular: RRR, S1 normal, S2 normal Pulmonary/Chest: Bilateral wheezing Abdominal: Soft. Non-tender, non-distended, bowel sounds are normal, no masses, organomegaly, or guarding present.  Neurological: A&O x3, Strenght is normal and symmetric bilaterally, cranial nerve II-XII are grossly intact, no focal motor deficit, sensory intact to light touch bilaterally.  Extremities : Trace edema of the lower extremities   Labs on Admission:  Results for orders placed during the hospital encounter of 12/20/12 (from the past 48 hour(s))  CBC     Status: None   Collection Time    12/20/12 10:50 PM      Result Value Range   WBC 6.6  4.0 - 10.5 K/uL   RBC 4.66  4.22 - 5.81 MIL/uL   Hemoglobin 15.4  13.0 - 17.0 g/dL   HCT 40.9  81.1 - 91.4 %   MCV 99.4  78.0 - 100.0 fL   MCH 33.0  26.0 - 34.0 pg   MCHC 33.3  30.0 - 36.0 g/dL  RDW 13.9  11.5 - 15.5 %   Platelets 214  150 - 400 K/uL  BASIC METABOLIC PANEL     Status: Abnormal   Collection Time    12/20/12 10:50 PM      Result Value Range   Sodium 136  135 - 145 mEq/L   Potassium 3.7  3.5 - 5.1 mEq/L   Chloride 99  96 - 112 mEq/L   CO2 31  19 - 32 mEq/L   Glucose, Bld 114 (*) 70 - 99 mg/dL   BUN 9  6 - 23 mg/dL   Creatinine, Ser 4.09  0.50 - 1.35 mg/dL   Calcium 9.4  8.4 - 81.1 mg/dL   GFR calc non Af Amer 81 (*) >90 mL/min   GFR calc Af Amer >90  >90 mL/min   Comment: (NOTE)     The eGFR has been  calculated using the CKD EPI equation.     This calculation has not been validated in all clinical situations.     eGFR's persistently <90 mL/min signify possible Chronic Kidney     Disease.  PRO B NATRIURETIC PEPTIDE     Status: None   Collection Time    12/20/12 10:50 PM      Result Value Range   Pro B Natriuretic peptide (BNP) 46.0  0 - 125 pg/mL    Radiological Exams on Admission: Dg Chest 2 View  12/20/2012   CLINICAL DATA:  Productive cough. Fever. Shortness of breath. COPD.  EXAM: CHEST  2 VIEW  COMPARISON:  None.  FINDINGS: The heart size and mediastinal contours are within normal limits. Both lungs are clear. Pulmonary hyperinflation again seen, consistent with COPD. No mass or lymphadenopathy identified. The visualized skeletal structures are unremarkable.  IMPRESSION: Stable exam.  COPD.  No active disease.   Electronically Signed   By: Myles Rosenthal M.D.   On: 12/20/2012 23:31    Assessment/Plan Active Problems:   COPD exacerbation  57 year old male with a history of COPD now being admitted with COPD exacerbation. We'll start the patient on Solu-Medrol 60 mg IV every 6 hours along with DuoNeb nebulizers 4 times a day. Will start Levaquin.  Code status: Full code     Time Spent on Admission: 75 min  Muhsin Doris S [facility] Pager: [phone] 12/21/2012, 5:18 AM  If 7PM-7AM, please contact night-coverage  [email or web address]  Password [email or web address]

## 2012-12-21 NOTE — Progress Notes (Signed)
Patient admitted by Dr. Sharl Ma earlier this morning.  Patient seen and examined.  He is an admitted with COPD exacerbation. He continues to wheeze and a short of breath. We'll continue current treatments for today. He may benefit from being started on an inhaled steroid and long-acting beta agonist. We'll reassess tomorrow.  Jerremy Maione

## 2012-12-21 NOTE — Progress Notes (Signed)
ANTIBIOTIC CONSULT NOTE - INITIAL  Pharmacy Consult for Levaquin Indication: respiratory infection  No Known Allergies  Patient Measurements: Height: 6\' 2"  (188 cm) Weight: 272 lb (123.378 kg) IBW/kg (Calculated) : 82.2  Vital Signs: Temp: 98.2 F (36.8 C) (11/24 0548) Temp src: Oral (11/23 2233) BP: 143/82 mmHg (11/24 0548) Pulse Rate: 69 (11/24 0548) Intake/Output from previous day:   Intake/Output from this shift:    Labs:  Recent Labs  12/20/12 2250  WBC 6.6  HGB 15.4  PLT 214  CREATININE 1.01   Estimated Creatinine Clearance: 112.7 ml/min (by C-G formula based on Cr of 1.01). No results found for this basename: VANCOTROUGH, VANCOPEAK, VANCORANDOM, GENTTROUGH, GENTPEAK, GENTRANDOM, TOBRATROUGH, TOBRAPEAK, TOBRARND, AMIKACINPEAK, AMIKACINTROU, AMIKACIN,  in the last 72 hours   Microbiology: No results found for this or any previous visit (from the past 720 hour(s)).  Medical History: Past Medical History  Diagnosis Date  . Hypertension   . COPD (chronic obstructive pulmonary disease)    Medications:  Scheduled:  . albuterol  2.5 mg Nebulization QID  . albuterol      . amLODipine  5 mg Oral Daily  . enalapril  5 mg Oral BID  . enoxaparin (LOVENOX) injection  40 mg Subcutaneous Q24H  . ipratropium  0.5 mg Nebulization QID  . levofloxacin  750 mg Oral QHS  . methylPREDNISolone (SOLU-MEDROL) injection  60 mg Intravenous Q6H  . sodium chloride  3 mL Intravenous Q12H  . tamsulosin  0.4 mg Oral Daily   Assessment: 57yo male admitted with worsening SOB and suspected URI. Pt received Levaquin 750mg  PO last pm on admission. Pt is currently afebrile with normal WBC.  Goal of Therapy:  Eradicate infection.  Plan:  Levaquin 750mg  PO q24hrs Monitor labs, renal fxn, and cultures  Valrie Hart A 12/21/2012,9:09 AM

## 2012-12-21 NOTE — Care Management Note (Addendum)
    Page 1 of 1   12/23/2012     3:11:10 PM   CARE MANAGEMENT NOTE 12/23/2012  Patient:  Daniel [name], Daniel [name]   Account Number:  [id number]  Date Initiated:  12/21/2012  Documentation initiated by:  Rosemary Holms  Subjective/Objective Assessment:   Pt admitted from home where he lives with his wife. No needs identified     Action/Plan:   Anticipated DC Date:  12/22/2012   Anticipated DC Plan:  HOME/SELF CARE      DC Planning Services  CM consult      Choice offered to / List presented to:             Status of service:  Completed, signed off Medicare Important Message given?   (If response is "NO", the following Medicare IM given date fields will be blank) Date Medicare IM given:   Date Additional Medicare IM given:    Discharge Disposition:  HOME/SELF CARE  Per UR Regulation:    If discussed at Long Length of Stay Meetings, dates discussed:    Comments:  12/23/12 1510 Arlyss Queen, RN BSN CM Pt discharged home today, No CM needs noted. 12/21/12 Rosemary Holms RN BSN CM

## 2012-12-21 NOTE — Progress Notes (Signed)
Utilization Review Complete  

## 2012-12-21 NOTE — Progress Notes (Signed)
Continues to wait on admitting MD.

## 2012-12-22 DIAGNOSIS — I10 Essential (primary) hypertension: Secondary | ICD-10-CM | POA: Diagnosis present

## 2012-12-22 DIAGNOSIS — J9600 Acute respiratory failure, unspecified whether with hypoxia or hypercapnia: Secondary | ICD-10-CM | POA: Diagnosis present

## 2012-12-22 LAB — COMPREHENSIVE METABOLIC PANEL
AST: 21 U/L (ref 0–37)
Albumin: 3.8 g/dL (ref 3.5–5.2)
BUN: 19 mg/dL (ref 6–23)
Calcium: 9.9 mg/dL (ref 8.4–10.5)
Creatinine, Ser: 0.84 mg/dL (ref 0.50–1.35)
GFR calc non Af Amer: >90 mL/min (ref >90)
Total Bilirubin: 0.4 mg/dL (ref 0.3–1.2)

## 2012-12-22 LAB — CBC
HCT: 47.6 % (ref 39.0–52.0)
MCH: 32.6 pg (ref 26.0–34.0)
MCHC: 33.2 g/dL (ref 30.0–36.0)
MCV: 98.3 fL (ref 78.0–100.0)
Platelets: 245 10*3/uL (ref 150–400)
RDW: 14.1 % (ref 11.5–15.5)

## 2012-12-22 MED ORDER — FUROSEMIDE 10 MG/ML IJ SOLN
40.0000 mg | Freq: Once | INTRAMUSCULAR | Status: AC
  Administered 2012-12-22: 40 mg via INTRAVENOUS
  Filled 2012-12-22: qty 4

## 2012-12-22 NOTE — Progress Notes (Addendum)
[facility] PROGRESS NOTE  Daniel [name] ZOX:[id number] DOB: January 21, 1956 DOA: 12/20/2012 PCP: Colette Ribas, MD  Assessment/Plan: 1. Acute respiratory failure. Appears to be improving. Still on oxygen, but hopeful to wean down to room air today. 2. COPD exacerbation. Continue steroids, antibiotics, nebulizer treatments. Continue pulmonary hygiene. 3. Lower extremity edema. BNP is normal. We'll give 1 dose of Lasix. May have some element of right-sided heart failure due to long history of COPD. May benefit from echocardiogram as an outpatient 4. Hypertension. Stable  Code Status: full code Family Communication: discussed with patient and wife at the bedside Disposition Plan: discharge home when improved, anticipate in the next 24-48 hours   Consultants:  none  Procedures:  none  Antibiotics:  levaquin 12/21/12  HPI/Subjective: Subjectively feels better. Still wheezing.  Objective: Filed Vitals:   12/22/12 0524  BP: 115/58  Pulse: 70  Temp: 97.6 F (36.4 C)  Resp: 20    Intake/Output Summary (Last 24 hours) at 12/22/12 2026 Last data filed at 12/22/12 1800  Gross per 24 hour  Intake    720 ml  Output      0 ml  Net    720 ml   Filed Weights   12/20/12 2233 12/21/12 0149  Weight: 122.471 kg (270 lb) 123.378 kg (272 lb)    Exam:   General:  NAD, mild increased work of breathing  Cardiovascular: s1, s2, rrr  Respiratory: bilateral exp wheezes  Abdomen: soft, nt, nd, bs+  Musculoskeletal: 1+ pitting edema b/l   Data Reviewed: Basic Metabolic Panel:  Recent Labs Lab 12/20/12 2250 12/22/12 0621  NA 136 141  K 3.7 4.4  CL 99 103  CO2 31 28  GLUCOSE 114* 149*  BUN 9 19  CREATININE 1.01 0.84  CALCIUM 9.4 9.9   Liver Function Tests:  Recent Labs Lab 12/22/12 0621  AST 21  ALT 30  ALKPHOS 39  BILITOT 0.4  PROT 7.4  ALBUMIN 3.8   No results found for this basename: LIPASE, AMYLASE,  in the last 168 hours No results found for  this basename: AMMONIA,  in the last 168 hours CBC:  Recent Labs Lab 12/20/12 2250 12/22/12 0621  WBC 6.6 15.0*  HGB 15.4 15.8  HCT 46.3 47.6  MCV 99.4 98.3  PLT 214 245   Cardiac Enzymes: No results found for this basename: CKTOTAL, CKMB, CKMBINDEX, TROPONINI,  in the last 168 hours BNP (last 3 results)  Recent Labs  12/20/12 2250  PROBNP 46.0   CBG: No results found for this basename: GLUCAP,  in the last 168 hours  No results found for this or any previous visit (from the past 240 hour(s)).   Studies: Dg Chest 2 View  12/20/2012   CLINICAL DATA:  Productive cough. Fever. Shortness of breath. COPD.  EXAM: CHEST  2 VIEW  COMPARISON:  None.  FINDINGS: The heart size and mediastinal contours are within normal limits. Both lungs are clear. Pulmonary hyperinflation again seen, consistent with COPD. No mass or lymphadenopathy identified. The visualized skeletal structures are unremarkable.  IMPRESSION: Stable exam.  COPD.  No active disease.   Electronically Signed   By: Myles Rosenthal M.D.   On: 12/20/2012 23:31    Scheduled Meds: . albuterol  2.5 mg Nebulization QID  . amLODipine  5 mg Oral Daily  . enalapril  5 mg Oral BID  . enoxaparin (LOVENOX) injection  40 mg Subcutaneous Q24H  . ipratropium  0.5 mg Nebulization QID  . levofloxacin  750 mg Oral QHS  . methylPREDNISolone (SOLU-MEDROL) injection  60 mg Intravenous Q6H  . sodium chloride  3 mL Intravenous Q12H  . tamsulosin  0.4 mg Oral Daily   Continuous Infusions:   Active Problems:   COPD exacerbation    Time spent:    [facility]  [facility] Pager [phone]. If 7PM-7AM, please contact night-coverage at [email or web address], password [facility] 12/22/2012, 8:26 PM  LOS: 2 days

## 2012-12-23 LAB — BASIC METABOLIC PANEL
BUN: 26 mg/dL — ABNORMAL HIGH (ref 6–23)
CO2: 26 mEq/L (ref 19–32)
Chloride: 102 mEq/L (ref 96–112)
GFR calc Af Amer: >90 mL/min (ref >90)
GFR calc non Af Amer: >90 mL/min (ref >90)
Glucose, Bld: 139 mg/dL — ABNORMAL HIGH (ref 70–99)
Potassium: 4.2 mEq/L (ref 3.5–5.1)
Sodium: 141 mEq/L (ref 135–145)

## 2012-12-23 MED ORDER — ALBUTEROL SULFATE HFA 108 (90 BASE) MCG/ACT IN AERS: 2.0000 | INHALATION_SPRAY | RESPIRATORY_TRACT | Status: DC | PRN

## 2012-12-23 MED ORDER — PREDNISONE 10 MG PO TABS: ORAL_TABLET | ORAL | Status: DC

## 2012-12-23 MED ORDER — IPRATROPIUM-ALBUTEROL 20-100 MCG/ACT IN AERS
1.0000 | INHALATION_SPRAY | Freq: Four times a day (QID) | RESPIRATORY_TRACT | Status: DC
  Filled 2012-12-23: qty 4

## 2012-12-23 MED ORDER — LEVOFLOXACIN 750 MG PO TABS: 750.0000 mg | ORAL_TABLET | Freq: Every day | ORAL | Status: DC

## 2012-12-23 MED ORDER — PREDNISONE 20 MG PO TABS: 40.0000 mg | ORAL_TABLET | Freq: Every day | ORAL | Status: DC

## 2012-12-23 MED ORDER — IPRATROPIUM-ALBUTEROL 20-100 MCG/ACT IN AERS: 1.0000 | INHALATION_SPRAY | Freq: Four times a day (QID) | RESPIRATORY_TRACT | Status: DC

## 2012-12-23 NOTE — Progress Notes (Signed)
ANTIBIOTIC CONSULT NOTE   Pharmacy Consult for Levaquin Indication: respiratory infection  No Known Allergies  Patient Measurements: Height: 6\' 2"  (188 cm) Weight: 272 lb (123.378 kg) IBW/kg (Calculated) : 82.2  Vital Signs: Temp: 97.7 F (36.5 C) (11/26 0421) Temp src: Oral (11/26 0421) BP: 120/61 mmHg (11/26 0421) Pulse Rate: 68 (11/26 0421) Intake/Output from previous day: 11/25 0701 - 11/26 0700 In: 1200 [P.O.:1200] Out: -  Intake/Output from this shift:    Labs:  Recent Labs  12/20/12 2250 12/22/12 0621 12/23/12 0601  WBC 6.6 15.0*  --   HGB 15.4 15.8  --   PLT 214 245  --   CREATININE 1.01 0.84 0.89   Estimated Creatinine Clearance: 127.8 ml/min (by C-G formula based on Cr of 0.89). No results found for this basename: VANCOTROUGH, VANCOPEAK, VANCORANDOM, GENTTROUGH, GENTPEAK, GENTRANDOM, TOBRATROUGH, TOBRAPEAK, TOBRARND, AMIKACINPEAK, AMIKACINTROU, AMIKACIN,  in the last 72 hours   Microbiology: No results found for this or any previous visit (from the past 720 hour(s)).  Medical History: Past Medical History  Diagnosis Date  . Hypertension   . COPD (chronic obstructive pulmonary disease)    Medications:  Scheduled:  . albuterol  2.5 mg Nebulization QID  . amLODipine  5 mg Oral Daily  . enalapril  5 mg Oral BID  . enoxaparin (LOVENOX) injection  40 mg Subcutaneous Q24H  . ipratropium  0.5 mg Nebulization QID  . levofloxacin  750 mg Oral QHS  . methylPREDNISolone (SOLU-MEDROL) injection  60 mg Intravenous Q6H  . sodium chloride  3 mL Intravenous Q12H  . tamsulosin  0.4 mg Oral Daily   Assessment: 57yo male admitted with worsening SOB and suspected URI.  Started on Levaquin 750mg  PO q24hrs, renal fxn is stable. Pt is currently afebrile and improving.  Goal of Therapy:  Eradicate infection.  Plan:  Levaquin 750mg  PO q24hrs Pharmacy will sign off.  Margo Aye, Dama Hedgepeth A 12/23/2012,8:56 AM

## 2012-12-23 NOTE — Progress Notes (Deleted)
Tele-psych computer currently being evaluated by IT department.  Spoke to Ava at [employer] and told her that they are currently working on the problem.  RN notified Dr. Felecia Shelling that tele-psych evaluation had not yet been completed, but they are working on the equipment to hopefully get it done today.

## 2012-12-23 NOTE — Progress Notes (Signed)
IV removed.  Site WNL.  AVS reviewed with patient.  Prescriptions and work notes (for patient and patient's spouse) provided to patient.  Patient verbalized understanding of discharge instructions, physician follow-up and medications.  Patient transported by NT via w/c to main entrance for discharge.  Patient reports all belongings intact and in possession at time of discharge.  Patient stable at time of discharge.

## 2012-12-23 NOTE — Progress Notes (Signed)
[facility] PROGRESS NOTE  Daniel [name]:[id number] DOB: 1955-08-24 DOA: 12/20/2012 PCP: Colette Ribas, MD  Assessment/Plan: 1. Acute respiratory failure on admission: I do not clearly see hypoxia documented. No hypoxia now. 2. COPD exacerbation: Much improved. Change to oral steroids. 3. Lower extremity edema: BNP normal.  None currently. Could consider outpatient evaluation. 4. Hypertension: Stable.   Home today  Followup with Dr. Juanetta Gosling as an outpatient  Steroid taper, finished antibiotics, start Combivent  Pending studies:   none  Code Status: full code DVT prophylaxis: Lovenox Family Communication: Discussed with wife at bedside Disposition Plan: Home  Brendia Sacks, MD  [facility]  Pager [phone] If 7PM-7AM, please contact night-coverage at [email or web address], password [facility] 12/23/2012, 1:37 PM  LOS: 3 days   Summary: 57 year old man presented with increasing shortness of breath. Admitted for COPD exacerbation  Consultants:  None  Procedures:  None  Antibiotics:  Levaquin 11/24 >> 11/28  HPI/Subjective: Feels much better. Breathing better. No new issues. Wants to go home.  Objective: Filed Vitals:   12/22/12 2257 12/23/12 0421 12/23/12 0733 12/23/12 1157  BP: 123/59 120/61    Pulse: 82 68    Temp: 97.3 F (36.3 C) 97.7 F (36.5 C)    TempSrc: Oral Oral    Resp: 20 20    Height:      Weight:      SpO2: 97% 97% 94% 92%    Intake/Output Summary (Last 24 hours) at 12/23/12 1337 Last data filed at 12/23/12 1230  Gross per 24 hour  Intake   1200 ml  Output      0 ml  Net   1200 ml     Filed Weights   12/20/12 2233 12/21/12 0149  Weight: 122.471 kg (270 lb) 123.378 kg (272 lb)    Exam:   Afebrile, vital signs stable. No hypoxia.  General: Appears calm and comfortable. Speech fluent and clear.  Cardiovascular: Regular rate and rhythm. No murmur, rub or gallop. No lower extremity edema.  Respiratory: Decreased  breath sounds but clear. No wheezes, rales or rhonchi. Normal respiratory effort.  Psychiatric: Grossly normal mood and affect. Speech fluent and appropriate.  Data Reviewed:  Basic metabolic panel unremarkable   chest x-ray COPD, no acute disease  Scheduled Meds: . albuterol  2.5 mg Nebulization QID  . amLODipine  5 mg Oral Daily  . enalapril  5 mg Oral BID  . enoxaparin (LOVENOX) injection  40 mg Subcutaneous Q24H  . ipratropium  0.5 mg Nebulization QID  . levofloxacin  750 mg Oral QHS  . methylPREDNISolone (SOLU-MEDROL) injection  60 mg Intravenous Q6H  . sodium chloride  3 mL Intravenous Q12H  . tamsulosin  0.4 mg Oral Daily   Continuous Infusions:   Active Problems:   COPD exacerbation   Acute respiratory failure   Essential hypertension, benign

## 2012-12-23 NOTE — Discharge Summary (Signed)
Physician Discharge Summary  Daniel [name] ZOX:[id number] DOB: [birth year] DOA: 12/20/2012  PCP: Daniel Ribas, MD  Admit date: 12/20/2012 Discharge date: 12/23/2012  Recommendations for Outpatient Follow-up:  1. Followup resolution of COPD exacerbation   Follow-up Information   Follow up with Daniel Ribas, MD In 1 week.   Specialty:  Family Medicine   Contact information:   [address] [place] [address] [phone]       Follow up with Daniel L, MD. Schedule an appointment as soon as possible for a visit in 2 weeks.   Specialty:  Pulmonary Disease   Contact information:   [address] [place] [address] [phone]      Discharge Diagnoses:  1. Acute respiratory failure on admission 2. COPD exacerbation 3. Hypertension  Discharge Condition: Improved Disposition: Home  Diet recommendation: Heart healthy  Filed Weights   12/20/12 2233 12/21/12 0149  Weight: 122.471 kg (270 lb) 123.378 kg (272 lb)    History of present illness:  57 year old man presented with increasing shortness of breath. Admitted for COPD exacerbation.  Hospital Course:  [name] was admitted for further evaluation of COPD exacerbation. He improved with steroids, nebulizer therapy and oxygen. He was subsequently weaned off oxygen. Hospitalization was uncomplicated. Discussed with Dr. Juanetta Gosling, patient will followup with him as an outpatient. Complete steroid taper and antibiotics as an outpatient.  1. Acute respiratory failure on admission: I do not clearly see hypoxia documented. No hypoxia now. 2. COPD exacerbation: Much improved. Change to oral steroids. 3. Lower extremity edema: BNP normal. None currently. Could consider outpatient evaluation. 4. Hypertension: Stable.  Consultants:  None Procedures:  None Antibiotics:  Levaquin 11/24 >> 11/28  Discharge Instructions  Discharge Orders   Future Orders Complete  By Expires   Activity as tolerated - No restrictions  As directed    Diet - low sodium heart healthy  As directed    Discharge instructions  As directed    Comments:     Call your physician or seek immediate attention for shortness of breath, wheezing, or worsening of condition.       Medication List    STOP taking these medications       albuterol 2 MG tablet  Commonly known as:  PROVENTIL  Replaced by:  albuterol 108 (90 BASE) MCG/ACT inhaler      TAKE these medications       albuterol 108 (90 BASE) MCG/ACT inhaler  Commonly known as:  PROVENTIL HFA;VENTOLIN HFA  Inhale 2 puffs into the lungs every 4 (four) hours as needed for wheezing or shortness of breath.     amLODipine 5 MG tablet  Commonly known as:  NORVASC  Take 5 mg by mouth daily.     enalapril 5 MG tablet  Commonly known as:  VASOTEC  Take 5 mg by mouth 2 (two) times daily.     Ipratropium-Albuterol 20-100 MCG/ACT Aers respimat  Commonly known as:  COMBIVENT  Inhale 1 puff into the lungs 4 (four) times daily.     levofloxacin 750 MG tablet  Commonly known as:  LEVAQUIN  Take 1 tablet (750 mg total) by mouth at bedtime.     multivitamin with minerals Tabs tablet  Take 1 tablet by mouth daily.     predniSONE 10 MG tablet  Commonly known as:  DELTASONE  Start 11/27. 11/27-11/29: Take 40 mg by mouth daily. 11/30-12/2: Take 20 mg by mouth daily by mouth.  12/3-12/5: Take 10 mg by mouth daily then stop.     tamsulosin 0.4 MG Caps capsule  Commonly known as:  FLOMAX  Take 0.4 mg by mouth daily.       No Known Allergies  The results of significant diagnostics from this hospitalization (including imaging, microbiology, ancillary and laboratory) are listed below for reference.    Significant Diagnostic Studies: Dg Chest 2 View  12/20/2012   CLINICAL DATA:  Productive cough. Fever. Shortness of breath. COPD.  EXAM: CHEST  2 VIEW  COMPARISON:  None.  FINDINGS: The heart size and mediastinal contours are  within normal limits. Both lungs are clear. Pulmonary hyperinflation again seen, consistent with COPD. No mass or lymphadenopathy identified. The visualized skeletal structures are unremarkable.  IMPRESSION: Stable exam.  COPD.  No active disease.   Electronically Signed   By: Myles Rosenthal M.D.   On: 12/20/2012 23:31    Labs: Basic Metabolic Panel:  Recent Labs Lab 12/20/12 2250 12/22/12 0621 12/23/12 0601  NA 136 141 141  K 3.7 4.4 4.2  CL 99 103 102  CO2 31 28 26   GLUCOSE 114* 149* 139*  BUN 9 19 26*  CREATININE 1.01 0.84 0.89  CALCIUM 9.4 9.9 9.6   Liver Function Tests:  Recent Labs Lab 12/22/12 0621  AST 21  ALT 30  ALKPHOS 39  BILITOT 0.4  PROT 7.4  ALBUMIN 3.8   CBC:  Recent Labs Lab 12/20/12 2250 12/22/12 0621  WBC 6.6 15.0*  HGB 15.4 15.8  HCT 46.3 47.6  MCV 99.4 98.3  PLT 214 245     Recent Labs  12/20/12 2250  PROBNP 46.0    Active Problems:   COPD exacerbation   Acute respiratory failure   Essential hypertension, benign   Time coordinating discharge: 35 minutes  Signed:  Brendia Sacks, MD [facility] 12/23/2012, 2:03 PM

## 2013-03-17 ENCOUNTER — Emergency Department (HOSPITAL_COMMUNITY): Payer: BC Managed Care – PPO

## 2013-03-17 ENCOUNTER — Encounter (HOSPITAL_COMMUNITY): Payer: Self-pay | Admitting: Emergency Medicine

## 2013-03-17 ENCOUNTER — Emergency Department (HOSPITAL_COMMUNITY)
Admission: EM | Admit: 2013-03-17 | Discharge: 2013-03-17 | Disposition: A | Discharge status: Home / Self Care | Payer: BC Managed Care – PPO | Attending: Emergency Medicine | Admitting: Emergency Medicine

## 2013-03-17 DIAGNOSIS — J4489 Other specified chronic obstructive pulmonary disease: Secondary | ICD-10-CM | POA: Insufficient documentation

## 2013-03-17 DIAGNOSIS — Y9289 Other specified places as the place of occurrence of the external cause: Secondary | ICD-10-CM | POA: Insufficient documentation

## 2013-03-17 DIAGNOSIS — S43016A Anterior dislocation of unspecified humerus, initial encounter: Secondary | ICD-10-CM | POA: Insufficient documentation

## 2013-03-17 DIAGNOSIS — I10 Essential (primary) hypertension: Secondary | ICD-10-CM | POA: Insufficient documentation

## 2013-03-17 DIAGNOSIS — J449 Chronic obstructive pulmonary disease, unspecified: Secondary | ICD-10-CM | POA: Insufficient documentation

## 2013-03-17 DIAGNOSIS — Z79899 Other long term (current) drug therapy: Secondary | ICD-10-CM | POA: Insufficient documentation

## 2013-03-17 DIAGNOSIS — W010XXA Fall on same level from slipping, tripping and stumbling without subsequent striking against object, initial encounter: Secondary | ICD-10-CM | POA: Insufficient documentation

## 2013-03-17 DIAGNOSIS — E119 Type 2 diabetes mellitus without complications: Secondary | ICD-10-CM | POA: Insufficient documentation

## 2013-03-17 DIAGNOSIS — W1809XA Striking against other object with subsequent fall, initial encounter: Secondary | ICD-10-CM | POA: Insufficient documentation

## 2013-03-17 DIAGNOSIS — F17200 Nicotine dependence, unspecified, uncomplicated: Secondary | ICD-10-CM | POA: Insufficient documentation

## 2013-03-17 DIAGNOSIS — Y9301 Activity, walking, marching and hiking: Secondary | ICD-10-CM | POA: Insufficient documentation

## 2013-03-17 DIAGNOSIS — R110 Nausea: Secondary | ICD-10-CM | POA: Insufficient documentation

## 2013-03-17 MED ORDER — ETOMIDATE 2 MG/ML IV SOLN
INTRAVENOUS | Status: AC
  Filled 2013-03-17: qty 20

## 2013-03-17 MED ORDER — ONDANSETRON HCL 4 MG/2ML IJ SOLN: 4.0000 mg | Freq: Once | INTRAMUSCULAR | Status: DC

## 2013-03-17 MED ORDER — ETOMIDATE 2 MG/ML IV SOLN
INTRAVENOUS | Status: AC | PRN
  Administered 2013-03-17: 18 mg via INTRAVENOUS

## 2013-03-17 MED ORDER — MORPHINE SULFATE 4 MG/ML IJ SOLN
4.0000 mg | Freq: Once | INTRAMUSCULAR | Status: AC
  Administered 2013-03-17: 4 mg via INTRAVENOUS
  Filled 2013-03-17: qty 1

## 2013-03-17 MED ORDER — ONDANSETRON HCL 4 MG/2ML IJ SOLN
4.0000 mg | Freq: Once | INTRAMUSCULAR | Status: AC
  Administered 2013-03-17: 4 mg via INTRAVENOUS
  Filled 2013-03-17: qty 2

## 2013-03-17 MED ORDER — ETOMIDATE 2 MG/ML IV SOLN: 0.1500 mg/kg | Freq: Once | INTRAVENOUS | Status: AC

## 2013-03-17 MED ORDER — HYDROCODONE-ACETAMINOPHEN 5-325 MG PO TABS: 2.0000 | ORAL_TABLET | ORAL | Status: DC | PRN

## 2013-03-17 NOTE — ED Notes (Signed)
Patient awake, oriented x 4. Tolerating PO fluids well. No nausea.

## 2013-03-17 NOTE — ED Notes (Addendum)
Pt reports slipped on ice and fell this morning.  C/O pain to r upper arm.  EMS put arm in sling.  EMS reports swelling and bruising to site.  EMS administered 5mg  morphine IV enroute.  Pt still rates pain at 10.  Radial pulse palpated.  Pillow placed under r arm for support.

## 2013-03-17 NOTE — ED Provider Notes (Signed)
CSN: [id number]     Arrival date & time 03/17/13  0756 History   First MD Initiated Contact with Patient 03/17/13 0813     Chief Complaint  Patient presents with  . Fall     (Consider location/radiation/quality/duration/timing/severity/associated sxs/prior Treatment) Patient is a 58 y.o. male presenting with fall. The history is provided by the patient. No language interpreter was used.  Fall The current episode started today. Associated symptoms include arthralgias and nausea. Pertinent negatives include no chest pain, joint swelling, neck pain, vomiting or weakness.   patient is a 58 year old male who presents here ambulatory after a fall this morning. He reports that he was walking in the parking lot and slipped on some ice. He reports that he fell and hit his shoulder and right arm. He denies any neck or back pain. No loss of consciousness. He reports that all of his pain is concentrated in his right upper arm and right shoulder. He denies any shortness of breath or difficulty breathing. He reports a history of COPD and diabetes. He is accompanied by his wife for this visit.  Past Medical History  Diagnosis Date  . Hypertension   . COPD (chronic obstructive pulmonary disease)    Past Surgical History  Procedure Laterality Date  . Appendectomy    . Hemorrhoid surgery    . Hernia repair      RLQ, umbilical   Family History  Problem Relation Age of Onset  . Seizures Mother   . Hypertension Mother   . COPD Father    History  Substance Use Topics  . Smoking status: Current Every Day Smoker -- 0.30 packs/day for 40 years    Types: Cigarettes  . Smokeless tobacco: Never Used  . Alcohol Use: No    Review of Systems  Respiratory: Negative for shortness of breath and wheezing.   Cardiovascular: Negative for chest pain.  Gastrointestinal: Positive for nausea. Negative for vomiting.  Musculoskeletal: Positive for arthralgias. Negative for back pain, gait problem, joint swelling  and neck pain.  Neurological: Negative for dizziness, syncope and weakness.      Allergies  Review of patient's allergies indicates no known allergies.  Home Medications   Current Outpatient Rx  Name  Route  Sig  Dispense  Refill  . albuterol (PROVENTIL HFA;VENTOLIN HFA) 108 (90 BASE) MCG/ACT inhaler   Inhalation   Inhale 2 puffs into the lungs every 4 (four) hours as needed for wheezing or shortness of breath.   1 Inhaler   0   . albuterol (PROVENTIL) (2.5 MG/3ML) 0.083% nebulizer solution   Nebulization   Take 2.5 mg by nebulization every 6 (six) hours as needed for wheezing or shortness of breath.         [name] albuterol (PROVENTIL) 4 MG tablet   Oral   Take 4 mg by mouth 4 (four) times daily.         [name] amLODipine (NORVASC) 5 MG tablet   Oral   Take 5 mg by mouth daily.         . enalapril (VASOTEC) 5 MG tablet   Oral   Take 5 mg by mouth 2 (two) times daily.         . furosemide (LASIX) 20 MG tablet   Oral   Take 1 tablet by mouth 2 (two) times daily.         . Multiple Vitamin (MULITIVITAMIN WITH MINERALS) TABS   Oral   Take 1 tablet by mouth daily.         [name]  Tamsulosin HCl (FLOMAX) 0.4 MG CAPS   Oral   Take 0.4 mg by mouth daily.          BP 103/70  Pulse 66  Temp(Src) 97.6 F (36.4 C) (Oral)  Resp 22  Ht 6\' 2"  (1.88 m)  Wt 280 lb (127.007 kg)  BMI 35.93 kg/m2  SpO2 99% Physical Exam  Nursing note and vitals reviewed. Constitutional: He is oriented to person, place, and time. He appears well-developed and well-nourished. No distress.  HENT:  Head: Normocephalic and atraumatic.  Eyes: Conjunctivae and EOM are normal.  Neck: Normal range of motion. Neck supple. No JVD present. No tracheal deviation present. No thyromegaly present.  Cardiovascular: Normal rate, regular rhythm and normal heart sounds.   Pulmonary/Chest: Effort normal and breath sounds normal. No respiratory distress. He has no wheezes.  Musculoskeletal:  Right shoulder  pain and right upper arm pain. Patient reports 10/10. Pulses 2+, good distal sensation and capillary refill less than 3 seconds. And able to move his right arm or shoulder do to intense pain upon exam.  Lymphadenopathy:    He has no cervical adenopathy.  Neurological: He is alert and oriented to person, place, and time.  Skin: Skin is warm and dry.  Psychiatric: He has a normal mood and affect. His behavior is normal. Judgment and thought content normal.    ED Course  Reduction of dislocation Date/Time: 03/17/2013 11:00 AM Performed by: Maudry Diego Authorized by: Elisha Headland Consent: Verbal consent obtained. written consent obtained. Risks and benefits: risks, benefits and alternatives were discussed Consent given by: patient Patient understanding: patient states understanding of the procedure being performed Patient consent: the patient's understanding of the procedure matches consent given Relevant documents: relevant documents present and verified Test results: test results available and properly labeled Site marked: the operative site was marked Imaging studies: imaging studies available Required items: required blood products, implants, devices, and special equipment available Patient identity confirmed: verbally with patient and arm band Time out: Immediately prior to procedure a "time out" was called to verify the correct patient, procedure, equipment, support staff and site/side marked as required. Preparation: Patient was prepped and draped in the usual sterile fashion. Local anesthesia used: no Patient sedated: yes Sedatives: etomidate Sedation start date/time: 03/17/2013 9:55 AM Sedation end date/time: 03/17/2013 11:02 AM Vitals: Vital signs were monitored during sedation. Patient tolerance: Patient tolerated the procedure well with no immediate complications. Comments:  No difficulty breathing.VS remained stable for duration of procedure. Post procedure patient is  awake and alert, eating and drinking.    (including critical care time) Labs Review Labs Reviewed - No data to display Imaging Review No results found.  EKG Interpretation   None       MDM   Final diagnoses:  Anterior dislocation of shoulder    Anterior shoulder dislocation after falling on the ice in a parking lot. Reduced with conscious sedation and close monitoring with RN at bedside. Dr. Roderic Palau at bedside for procedure. Pt had full recovery after conscious sedation. Awake and alert, eating and drinking in room. Ambulatory at bedside, no weakness or dizziness. VS stable throughout procedure. Shoulder reduction successful with post-reduction film showing good placement. No numbness or tingling of extremity. Distal pulses 2+. Right shoulder placed in sling and instructed to keep in sling until follow-up with Ortho. No work until cleared by Ortho. Discussed plan with pt and he agrees.      Elisha Headland, NP 03/21/13 2202

## 2013-03-17 NOTE — Discharge Instructions (Signed)
Shoulder Dislocation  Your shoulder is made up of three bones: the collar bone (clavicle); the shoulder blade (scapula), which includes the socket (glenoid cavity); and the upper arm bone (humerus). Your shoulder joint is the place where these bones meet. Strong, fibrous tissues hold these bones together (ligaments). Muscles and strong, fibrous tissues that connect the muscles to these bones (tendons) allow your arm to move through this joint. The range of motion of your shoulder joint is more extensive than most of your other joints, and the glenoid cavity is very shallow. That is the reason that your shoulder joint is one of the most unstable joints in your body. It is far more prone to dislocation than your other joints. Shoulder dislocation is when your humerus is forced out of your shoulder joint.  CAUSES  Shoulder dislocation is caused by a forceful impact on your shoulder. This impact usually is from an injury, such as a sports injury or a fall.  SYMPTOMS  Symptoms of shoulder dislocation include:   Deformity of your shoulder.   Intense pain.   Inability to move your shoulder joint.   Numbness, weakness, or tingling around your shoulder joint (your neck or down your arm).   Bruising or swelling around your shoulder.  DIAGNOSIS  In order to diagnose a dislocated shoulder, your caregiver will perform a physical exam. Your caregiver also may have an X-ray exam done to see if you have any broken bones. Magnetic resonance imaging (MRI) is a procedure that sometimes is done to help your caregiver see any damage to the soft tissues around your shoulder, particularly your rotator cuff tendons. Additionally, your caregiver also may have electromyography done to measure the electrical discharges produced in your muscles if you have signs or symptoms of nerve damage.  TREATMENT  A shoulder dislocation is treated by placing the humerus back in the joint (reduction). Your caregiver does this either manually (closed  reduction), by moving your humerus back into the joint through manipulation, or through surgery (open reduction). When your humerus is back in place, severe pain should improve almost immediately.  You also may need to have surgery if you have a weak shoulder joint or ligaments, and you have recurring shoulder dislocations, despite rehabilitation. In rare cases, surgery is necessary if your nerves or blood vessels are damaged during the dislocation.  After your reduction, your arm will be placed in a shoulder immobilizer or sling to keep it from moving. Your caregiver will have you wear your shoulder immoblizer or sling for 3 days to 3 weeks, depending on how serious your dislocation is. When your shoulder immobilizer or sling is removed, your caregiver may prescribe physical therapy to help improve the range of motion in your shoulder joint.  HOME CARE INSTRUCTIONS   The following measures can help to reduce pain and speed up the healing process:   Rest your injured joint. Do not move it. Avoid activities similar to the one that caused your injury.   Apply ice to your injured joint for the first day or two after your reduction or as directed by your caregiver. Applying ice helps to reduce inflammation and pain.   Put ice in a plastic bag.   Place a towel between your skin and the bag.   Leave the ice on for 15-20 minutes at a time, every 2 hours while you are awake.   Exercise your hand by squeezing a soft ball. This helps to eliminate stiffness and swelling in your hand and   shoulder immobilizer or sling becomes damaged.  Your pain becomes worse rather than better.  You lose feeling in your arm or hand, or they become white and cold. MAKE SURE YOU:   Understand these instructions.  Will watch your condition.  Will get help right away if you are not  doing well or get worse. Document Released: 10/09/2000 Document Revised: 04/08/2011 Document Reviewed: 11/04/2010 [facility] Patient Information 2014 [place], [place].   Wear sling until you see Dr. Benetta Spar for mod-severe pain, otherwise take ibuprofen No work until you see Dr, Aline Brochure Rest and use of ice packs to shoulder

## 2013-03-17 NOTE — ED Notes (Signed)
Remains alert/oriented x 4. No distress. Given Sprite per request.

## 2013-03-22 NOTE — ED Provider Notes (Signed)
Medical screening examination/treatment/procedure(s) were performed by non-physician practitioner and as supervising physician I was immediately available for consultation/collaboration.  EKG Interpretation   None         Maudry Diego, MD 03/22/13 [id number]

## 2013-03-25 ENCOUNTER — Ambulatory Visit: Payer: BC Managed Care – PPO | Admitting: Orthopedic Surgery

## 2013-03-29 ENCOUNTER — Encounter: Payer: Self-pay | Admitting: Orthopedic Surgery

## 2013-03-29 ENCOUNTER — Ambulatory Visit (INDEPENDENT_AMBULATORY_CARE_PROVIDER_SITE_OTHER): Payer: BC Managed Care – PPO | Admitting: Orthopedic Surgery

## 2013-03-29 VITALS — BP 130/77 | Ht 74.5 in | Wt 272.0 lb

## 2013-03-29 DIAGNOSIS — S43006A Unspecified dislocation of unspecified shoulder joint, initial encounter: Secondary | ICD-10-CM

## 2013-03-29 MED ORDER — HYDROCODONE-ACETAMINOPHEN 5-325 MG PO TABS: 2.0000 | ORAL_TABLET | ORAL | Status: DC | PRN

## 2013-03-29 NOTE — Patient Instructions (Addendum)
Do Codman exercises given OOW x 1 week

## 2013-03-29 NOTE — Progress Notes (Signed)
Patient ID: Daniel [name], male   DOB: April 28, 1955, 58 y.o.   MRN: [id number] Chief Complaint  Patient presents with  . Shoulder Pain    ER follow up Right shoulder dislocation d/t injury 03/17/13    58 year old male presents with right shoulder dislocation first time dislocator reduced in the emergency room date of injury February 18 and he is post injury day #12 he complains of throbbing constant pain with stiffness swelling and muscle spasms he reports numbness but says he has no numbness in his hand or arm.  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 drink  He is followed by [employer] he is on the following medications albuterol furosemide Ventolin enalapril amlodipine tamsulosin  Vital signs:   General the patient is well-developed and well-nourished grooming and hygiene are normal Oriented x3 Mood and affect normal Ambulation normal  Inspection of the right shoulder shows diffuse tenderness loss of motion we did not test joint stability internal and external rotation was normal I brought his arm up to abduction he couldn't hold the air Skin bruising in the anterior shoulder area Cardiovascular exam is normal Sensory exam normal  Status post anterior shoulder dislocation  In this age group always a chance of rotator cuff tear  Recommend Codman exercises followup in a week to see if he can return to work he should remain out of work his medications refilled he can remove the sling

## 2013-03-30 ENCOUNTER — Ambulatory Visit: Payer: BC Managed Care – PPO | Admitting: Orthopedic Surgery

## 2013-04-05 ENCOUNTER — Ambulatory Visit (INDEPENDENT_AMBULATORY_CARE_PROVIDER_SITE_OTHER): Payer: BC Managed Care – PPO | Admitting: Orthopedic Surgery

## 2013-04-05 ENCOUNTER — Encounter: Payer: Self-pay | Admitting: Orthopedic Surgery

## 2013-04-05 VITALS — BP 135/87 | Ht 74.5 in | Wt 272.0 lb

## 2013-04-05 DIAGNOSIS — S43006A Unspecified dislocation of unspecified shoulder joint, initial encounter: Secondary | ICD-10-CM

## 2013-04-05 DIAGNOSIS — M75100 Unspecified rotator cuff tear or rupture of unspecified shoulder, not specified as traumatic: Secondary | ICD-10-CM

## 2013-04-05 DIAGNOSIS — S43429A Sprain of unspecified rotator cuff capsule, initial encounter: Secondary | ICD-10-CM

## 2013-04-05 MED ORDER — HYDROCODONE-ACETAMINOPHEN 5-325 MG PO TABS: 2.0000 | ORAL_TABLET | ORAL | Status: DC | PRN

## 2013-04-05 NOTE — Progress Notes (Signed)
Patient ID: Daniel [name], male   DOB: 1955-05-10, 58 y.o.   MRN: [id number]  Chief Complaint  Patient presents with  . Follow-up    one week recheck right shoulder dislocation DOI 03/17/13    58 years old fell on the ice dislocated his right shoulder. He had a closed reduction. No fracture. After 4 weeks he still has painful for elevation weakness in his rotator cuff inability to place his arm over his head. His pain in the anterolateral shoulder joint.  Review of systems no numbness or tingling  Exam shows stable vital signs BP 135/87  Ht 6' 2.5" (1.892 m)  Wt 272 lb (123.378 kg)  BMI 34.47 kg/m2  He is tenderness over the lateral deltoid anterior shoulder joint line greater tuberosity with decreased range of motion. The shoulder remained stable his weakness in the supraspinatus tendon. He has a positive drop test. Skin is ecchymotic over the anterior shoulder. Neurovascular exam is intact  High incidence rotator cuff tears in this age group after shoulder dislocation. 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 Recommend MRI right shoulder to evaluate for rotator cuff tear and surgical intervention.  Continue work. Patient will need to go on short-term disability. Refill pain medications. Followup with MRI.

## 2013-04-05 NOTE — Patient Instructions (Signed)
MRI ordered OOW note

## 2013-04-12 ENCOUNTER — Telehealth: Payer: Self-pay | Admitting: *Deleted

## 2013-04-12 MED ADMIN — albuterol (PROVENTIL HFA;VENTOLIN HFA) 108 (90 BASE) MCG/ACT inhaler 4 puff: 4 | RESPIRATORY_TRACT | @ 18:00:00 | NDC 59310057922

## 2013-04-12 MED FILL — PROVENTIL HFA 108 (90 BASE) MCG/ACT IN AERS: 108 (90 Base) MCG/ACT | RESPIRATORY_TRACT | Qty: 0.33

## 2013-04-12 NOTE — Telephone Encounter (Signed)
Patient called to request refill on Hydrocodone 5/325 mg? Please advise. [phone] (H)

## 2013-04-13 ENCOUNTER — Other Ambulatory Visit: Payer: Self-pay | Admitting: Orthopedic Surgery

## 2013-04-13 DIAGNOSIS — S43006A Unspecified dislocation of unspecified shoulder joint, initial encounter: Secondary | ICD-10-CM

## 2013-04-13 MED ORDER — HYDROCODONE-ACETAMINOPHEN 7.5-325 MG PO TABS: 1.0000 | ORAL_TABLET | Freq: Four times a day (QID) | ORAL | Status: AC | PRN

## 2013-04-13 NOTE — Telephone Encounter (Signed)
Refilled and patient aware to pick up prescription.

## 2013-04-13 NOTE — Telephone Encounter (Signed)
Pick up Rx

## 2013-04-13 NOTE — Procedures (Signed)
Spirometry was acceptable and reproducible by ATS standards    Spirometry  1. Spirometry shows mild obstructive defect.    Lung Volumes  2. Lung volumes are normal.    Bronchodilator  1. There is no response to bronchodilator demonstrated.   [Increase in FEV1 and/or FVC => 12% of control and => 200 ml]    Flow-Volume Loop  3. The flow-volume loop is compatible with an obstructive process.    Diffusing Capacity  4. Diffusing capacity is normal.        Overall Interpretation  Mild obstruction is present.    FEV1 is 3.11 L at 83% predicted.        OBSTRUCTION % Predicted FEV1   MILD >70%   MODERATE 60-69%   MODERATELY-SEVERE 50-59%   SEVERE 35-49%   VERY SEVERE <35%         RESTRICTION % Predicted TLC   MILD Less than LLN but > than 70%   MODERATE 60-69%   MODERATELY-SEVERE <60%                 DIFFUSION CAPACITY DL,CO % Pred   MILD >60% AND < LLN   MODERATE 40-60%   SEVERE <40%       PFT data will be scanned into the media tab in epic.    Royston Cowper, [facility]

## 2013-04-14 ENCOUNTER — Ambulatory Visit (HOSPITAL_COMMUNITY)
Admission: RE | Admit: 2013-04-14 | Discharge: 2013-04-14 | Disposition: A | Discharge status: Home / Self Care | Payer: BC Managed Care – PPO | Source: Ambulatory Visit | Attending: Orthopedic Surgery | Admitting: Orthopedic Surgery

## 2013-04-14 DIAGNOSIS — M75100 Unspecified rotator cuff tear or rupture of unspecified shoulder, not specified as traumatic: Secondary | ICD-10-CM

## 2013-04-19 ENCOUNTER — Ambulatory Visit: Payer: BC Managed Care – PPO | Admitting: Orthopedic Surgery

## 2013-04-22 ENCOUNTER — Emergency Department: Admit: 2013-04-22 | Payer: MEDICAID

## 2013-04-22 ENCOUNTER — Inpatient Hospital Stay: Admit: 2013-04-22 | Discharge: 2013-04-22 | Disposition: A | Discharge status: Home / Self Care | Payer: MEDICAID

## 2013-04-22 DIAGNOSIS — I951 Orthostatic hypotension: Secondary | ICD-10-CM

## 2013-04-22 LAB — VENOUS BLOOD GAS, LINE/SYRINGE
%HBO2-Line Draw: 53.3 % (ref 40.0–70.0)
Base Excess-Line Draw: 4.8 mmol/L (ref <=3.0)
CO2 Content-Line Draw: 32 mmol/L (ref 25–29)
Carboxyhgb-Line Draw: 5 % (ref 0.0–2.0)
HCO3-Line Draw: 30 mmol/L (ref 24–28)
Methemoglobin-Line Draw: 0.2 % (ref 0.0–1.5)
PCO2-Line Draw: 50 mm Hg (ref 41–51)
PH-Line Draw: 7.4 (ref 7.32–7.42)
PO2-Line Draw: 29 mm Hg (ref 25–40)
Reduced Hemoglobin-Line Draw: 41.5 % (ref 0.0–5.0)

## 2013-04-22 LAB — CBC
Hematocrit: 44.3 % (ref 38.5–50.0)
Hemoglobin: 14.9 g/dL (ref 13.2–17.1)
MCH: 32.6 pg (ref 27.0–33.0)
MCHC: 33.6 g/dL (ref 32.0–36.0)
MCV: 96.9 fL (ref 80.0–100.0)
MPV: 10.1 fL (ref 7.5–11.5)
Platelets: 126 10*3/uL (ref 140–400)
RBC: 4.57 10*6/uL (ref 4.20–5.80)
RDW: 14.4 % (ref 11.0–15.0)
WBC: 5.2 10*3/uL (ref 3.8–10.8)

## 2013-04-22 LAB — DIFFERENTIAL
Basophils Absolute: 42 /uL (ref 0–200)
Basophils Relative: 0.8 % (ref 0.0–1.0)
Eosinophils Absolute: 52 /uL (ref 15–500)
Eosinophils Relative: 1 % (ref 0.0–8.0)
Lymphocytes Absolute: 1726 /uL (ref 850–3900)
Lymphocytes Relative: 33.2 % (ref 15.0–45.0)
Monocytes Absolute: 478 /uL (ref 200–950)
Monocytes Relative: 9.2 % (ref 0.0–12.0)
Neutrophils Absolute: 2902 /uL (ref 1500–7800)
Neutrophils Relative: 55.8 % (ref 40.0–80.0)

## 2013-04-22 LAB — BASIC METABOLIC PANEL
Anion Gap: 3 mmol/L (ref 3–16)
BUN: 9 mg/dL (ref 7–25)
CO2: 29 mmol/L (ref 21–31)
Calcium: 8.8 mg/dL (ref 8.6–10.3)
Chloride: 102 mmol/L (ref 98–110)
Creatinine: 1.03 mg/dL (ref 0.60–1.30)
GFR MDRD Af Amer: 90 See note.
GFR MDRD Non Af Amer: 74 See note.
Glucose: 95 mg/dL (ref 70–100)
Osmolality, Calculated: 276 mOsm/kg (ref 278–305)
Potassium: 3.9 mmol/L (ref 3.5–5.3)
Sodium: 134 mmol/L (ref 133–146)

## 2013-04-22 LAB — POCT B-TYPE NATRIURETIC PEPTIDE (BNP): POC BNP: 43 pg/mL (ref 0.0–99.9)

## 2013-04-22 LAB — MAGNESIUM: Magnesium: 2 mg/dL (ref 1.9–2.7)

## 2013-04-22 LAB — POCT TROPONIN
POC Troponin I: 0 ng/mL (ref 0.00–0.05)
POC Troponin I: 0 ng/mL (ref 0.00–0.05)

## 2013-04-22 LAB — T4, FREE: Free T4: 1.09 ng/dL (ref 0.61–1.76)

## 2013-04-22 LAB — POC GLU MONITORING DEVICE: POC Glucose Monitoring Device: 102 mg/dL (ref 65–99)

## 2013-04-22 LAB — PHOSPHORUS: Phosphorus: 3.4 mg/dL (ref 2.5–5.0)

## 2013-04-22 LAB — TSH: TSH: 0.7 u[IU]/mL (ref 0.34–5.60)

## 2013-04-22 MED ORDER — lactated ringers 1,000 mL IV fluid
Freq: Once | INTRAVENOUS | Status: AC
Start: 2013-04-22 — End: 2013-04-22
  Administered 2013-04-22: 19:00:00 via INTRAVENOUS

## 2013-04-22 NOTE — Unmapped (Signed)
ED Attending Attestation Note    Date of service:  04/22/2013    This patient was seen by the resident physician.  I have seen and examined the patient, agree with the workup, evaluation, management and diagnosis. The care plan has been discussed and I concur.  I have reviewed the ECG and concur with the resident's interpretation.    My assessment reveals a 58 y.o. male complains of a 72 hour history of dizziness.  He has had an episode where he was walking down the sidewalk and has had the  feeling of drifting to the right.  His wife corroborates this.  He feels that this happens when his blood pressure elevates.  On physical exam the patient's gait and station is normal.  No nystagmus.[name]

## 2013-04-22 NOTE — Unmapped (Signed)
[facility] ED Note    Date of service:  04/22/2013    Reason for Visit: Dizziness and shaky       Patient History     HPI    This is a 58 yo malae with past medical history of hypertension, DMII who presents to the emergency department with dizziness and shakiness. The patient states that it first started about 3 days ago when he was walking down the street he was feeling dizzy and could not walk straight. It is worse when he is standing up and walking. Denies headache, nausea, vomiting, fever, chills, weakness, numbness of extremities.      Past Medical History   Diagnosis Date   ??? Hypertension        No past surgical history on file.    Patient  reports that he does not use illicit drugs. His tobacco and alcohol histories are not on file.      Previous Medications    HYDROCHLOROTHIAZIDE (HYDRODIURIL) 25 MG TABLET    Take 25 mg by mouth daily.    TRAMADOL (ULTRAM) 50 MG TABLET    Take 1 tablet (50 mg total) by mouth every 6 hours as needed for Pain.       Allergies:   Allergies as of 04/22/2013   ??? (Not on File)       Review of Systems     Review of Systems    A complete ROS was performed. All systems were reviewed and are negative except as noted in the history of the present illness.      Physical Exam     ED Triage Vitals   Vital Signs Group      Temp --       Temp src --       Pulse --       Heart Rate Source --       Resp --       BP --       BP Location --       BP Method --       Patient Position --    SpO2 --    O2 Device --        Physical Exam   Constitutional: He appears well-developed and well-nourished.   HENT:   Head: Normocephalic and atraumatic.   Eyes: EOM are normal. Pupils are equal, round, and reactive to light.   Neck: Normal range of motion. Neck supple. No JVD present. No tracheal deviation present. No thyromegaly present.   Cardiovascular: Normal rate, regular rhythm and normal heart sounds.  Exam reveals no gallop and no friction rub.    No  murmur heard.  Pulmonary/Chest: Effort normal and breath sounds normal. No respiratory distress. He has no wheezes. He has no rales.   Abdominal: Soft. Bowel sounds are normal. He exhibits no distension and no mass. There is no tenderness. No hernia.   Musculoskeletal: Normal range of motion. He exhibits no edema and no tenderness.   Lymphadenopathy:     He has no cervical adenopathy.   Neurological: He is oriented to person, place, situation and time.  Normal speech without aphasia or dysarthria.    Skin: Skin is warm.   Psychiatric: His behavior is normal. Judgment normal.         Diagnostic Studies     Labs:    Please see electronic medical record for any tests performed in the ED    Radiology:    Please  see electronic medical record for any tests performed in the ED    EKG:    Indication dizziness, Rate 73, Rhythm normal sinus rhythm, Interval  , Axis 77 72 70, ST Segment Change none and Comparison to prior EKG none, last ekf showed right bundle bracnh block    Emergency Department Procedures     Procedures    ED Course and MDM     [name] is a 58 y.o. male who presented to the emergency department with Dizziness and shaky     This is a 59 yo malae with past medical history of hypertension, DMII who presents to the emergency department with dizziness and shakiness.  Ordered CBC, renal, LFT, TSH, T4, UA to check for any metabolic abnormalities and they were all unremarkable. CXR, EKG, and serial troponin were negative for any cardiogenic etiology. CT head was negative for any intracranial abnormalities or bleeding. The patient remained stable vital signs during the ED stay wo any discomfort.     -Clinical impression is that was having dizziness and shakiness 2/2 orthostatic hypotension. Gave recommendations and discharge instruction and he understood well    The patient was triaged to B pod. The patient was evaluated by myself, the R4 and the attending physician who agrees with my assessment and  plan. History and physical exam were performed. Work up was obtained as indicated.     Critical Care Time (Attendings)           Laurance Flatten, DDS  Resident  04/22/13 [phone]

## 2013-04-22 NOTE — Unmapped (Signed)
ED Screening Protocol - Yes

## 2013-04-22 NOTE — Unmapped (Signed)
Pt presents to [facility] ED c/o increased dizziness and shakiness x 3 days. Pt states he has not been feeling well and yesterday afternoon began to feel like he was staggering. Pt alert and oriented x 4. Respirations equal and unlabored. Pt speaking in complete sentences with no distress noted. Pt VSS at this time, see chart. Pt c/o right sided chest pain x 30 minutes. Pt describes chest pain as tightness w/ a pain rating of 8/10. -N/V/D. Pt placed on continuous cardiac monitoring. EKG completed. No other concerns at this time. RN will continue to monitor.

## 2013-04-22 NOTE — Unmapped (Signed)
Return to emergency department with any worsening symptoms.

## 2013-04-22 NOTE — Unmapped (Signed)
Pt c/o feeling shaky and dizzy and feels like he is going to pass out.  States this has been happening for the last couple of days.  States that he called his PCP but has not received a call back.

## 2013-04-28 ENCOUNTER — Telehealth: Payer: Self-pay | Admitting: Orthopedic Surgery

## 2013-04-28 NOTE — Telephone Encounter (Signed)
Routing to Dr Harrison 

## 2013-04-28 NOTE — Telephone Encounter (Signed)
Patient called, requests refill on pain medication, Norco 7.5-325; relays that his office visit appointment had to be re-scheduled due to MRI having been re-scheduled to [facility], per previous note - he is having the MRI tomorrow, 04/29/13 and returning here for follow-up 05/11/13.  His ph # is [phone], and cell # is [phone].

## 2013-04-29 ENCOUNTER — Ambulatory Visit (HOSPITAL_COMMUNITY)
Admission: RE | Admit: 2013-04-29 | Discharge: 2013-04-29 | Disposition: A | Discharge status: Home / Self Care | Payer: BC Managed Care – PPO | Source: Ambulatory Visit | Attending: Orthopedic Surgery | Admitting: Orthopedic Surgery

## 2013-04-29 ENCOUNTER — Ambulatory Visit (HOSPITAL_COMMUNITY): Admission: RE | Admit: 2013-04-29 | Payer: BC Managed Care – PPO | Source: Ambulatory Visit

## 2013-04-29 ENCOUNTER — Other Ambulatory Visit: Payer: Self-pay | Admitting: Orthopedic Surgery

## 2013-04-29 ENCOUNTER — Telehealth: Payer: Self-pay | Admitting: Orthopedic Surgery

## 2013-04-29 DIAGNOSIS — IMO0001 Reserved for inherently not codable concepts without codable children: Secondary | ICD-10-CM

## 2013-04-29 DIAGNOSIS — Z5189 Encounter for other specified aftercare: Secondary | ICD-10-CM | POA: Insufficient documentation

## 2013-04-29 DIAGNOSIS — M25511 Pain in right shoulder: Secondary | ICD-10-CM

## 2013-04-29 MED ORDER — HYDROCODONE-ACETAMINOPHEN 5-325 MG PO TABS: 1.0000 | ORAL_TABLET | Freq: Four times a day (QID) | ORAL | Status: DC | PRN

## 2013-04-29 NOTE — Telephone Encounter (Signed)
Refilled per Dr. Aline Brochure, and patient picked up prescription.

## 2013-04-29 NOTE — Telephone Encounter (Signed)
Ok

## 2013-04-29 NOTE — Telephone Encounter (Signed)
Patient's MRI was this evening at [facility] radiology, having had to be re-scheduled due to unable to have completed at [facility] (apparently not able to be comfortably accomodated on the table at [facility]).  He does have a scheduled appointment on 05/11/13 for results, however, is awaiting results.  If need to call patient with results, his cell ph# is  [phone] / home [phone].

## 2013-05-03 ENCOUNTER — Encounter: Payer: Self-pay | Admitting: Orthopedic Surgery

## 2013-05-10 ENCOUNTER — Ambulatory Visit (INDEPENDENT_AMBULATORY_CARE_PROVIDER_SITE_OTHER): Payer: BC Managed Care – PPO | Admitting: Orthopedic Surgery

## 2013-05-10 ENCOUNTER — Encounter: Payer: Self-pay | Admitting: Orthopedic Surgery

## 2013-05-10 VITALS — BP 134/85 | Ht 74.5 in | Wt 258.0 lb

## 2013-05-10 DIAGNOSIS — M719 Bursopathy, unspecified: Secondary | ICD-10-CM

## 2013-05-10 DIAGNOSIS — M67919 Unspecified disorder of synovium and tendon, unspecified shoulder: Secondary | ICD-10-CM

## 2013-05-10 DIAGNOSIS — S43006A Unspecified dislocation of unspecified shoulder joint, initial encounter: Secondary | ICD-10-CM

## 2013-05-10 MED ORDER — BUDESONIDE-FORMOTEROL FUMARATE 160-4.5 MCG/ACT IN AERO
Freq: Two times a day (BID) | RESPIRATORY_TRACT | Status: DC
Start: 2013-05-10 — End: 2013-09-13

## 2013-05-10 MED ORDER — BUDESONIDE-FORMOTEROL FUMARATE 160-4.5 MCG/ACT IN AERO
Freq: Two times a day (BID) | RESPIRATORY_TRACT | Status: DC
Start: 2013-05-10 — End: 2013-11-11

## 2013-05-10 MED ORDER — HYDROCODONE-ACETAMINOPHEN 5-325 MG PO TABS: 1.0000 | ORAL_TABLET | Freq: Four times a day (QID) | ORAL | Status: AC | PRN

## 2013-05-10 NOTE — Progress Notes (Signed)
Patient ID: Daniel [name], male   DOB: 06/11/1955, 58 y.o.   MRN: [id number]  Chief Complaint  Patient presents with  . Results    MRI Results Right Shoulder    Hospital injection  MRI results reviewed with patient Daniel [name] lesion but no rotator cuff tear shoulder now reduced  Review of systems denies numbness or tingling  Physical Exam(6) GENERAL: normal development   CDV: pulses are normal   Skin: normal  Psychiatric: awake, alert and oriented  Neuro: normal sensation  MSK Gait:  Noncontributory 1 full range of motion right shoulder mild pain subacromial space with range of motion 2 no crepitance 3 no instability 4 rotator cuff strength grade 5  Imaging: No imaging today  Assessment: Shoulder dislocation with rotator cuff syndrome    Plan: Injection right shoulder subacromial space Return to work tomorrow Follow as needed  Shoulder Injection Procedure Note   Pre-operative Diagnosis: right  RC Syndrome  Post-operative Diagnosis: same  Indications: pain   Anesthesia: ethyl chloride   Procedure Details   Verbal consent was obtained for the procedure. The shoulder was prepped withalcohol and the skin was anesthetized. A 20 gauge needle was advanced into the subacromial space through posterior approach without difficulty  The space was then injected with 3 ml 1% lidocaine and 1 ml of depomedrol. The injection site was cleansed with isopropyl alcohol and a dressing was applied.  Complications:  None; patient tolerated the procedure well.   Meds ordered this encounter  Medications  . HYDROcodone-acetaminophen (NORCO/VICODIN) 5-325 MG per tablet    Sig: Take 1 tablet by mouth every 6 (six) hours as needed for moderate pain.    Dispense:  56 tablet    Refill:  0

## 2013-05-10 NOTE — Patient Instructions (Signed)
Return to work   You have received a steroid shot. 15% of patients experience increased pain at the injection site with in the next 24 hours. This is best treated with ice and tylenol extra strength 2 tabs every 8 hours. If you are still having pain please call the office.

## 2013-05-10 NOTE — Communication Body (Signed)
Assessment:  Assessment:    1. COPD (chronic obstructive pulmonary disease)    2. Tobacco abuse    3. GERD (gastroesophageal reflux disease)            Plan:  Plan:    1. I discussed with patient the above diagnosis  2. I reviewed his PFT and recent CXR report  3. I will change Q Var to Symbicort 160 BID and continue Albuterol PRN  4. Educated about GERD related respiratory symptoms  5. If not better will consider CT chest  6. I encouraged him to quit smoking, refer to COPD clinic  7. Recommended Prevnar and annual flu vaccine  8. RTC in 3 months

## 2013-05-10 NOTE — Progress Notes (Signed)
[name] Peck is 58 y.o. male here for Pulmonary Medicine consultation referred by Dr. Holland Falling for evaluation of had concerns including Shortness of Breath.   Patient has hx of COPD for the last few years. He is a smoker for the last 45 years  His recent PFT showed mild obstructive changes  He was seen last month at [facility] ED and CXR was reported normal  He uses Q var and Albuterol PRN  He does have hx of GERD  He denies any weight loss, hemoptysis or diaphoresis    Past Medical History   Diagnosis Date   ??? Chronic back pain    ??? Hypertension    ??? COPD (chronic obstructive pulmonary disease) ([place])    ??? Cervical pain (neck)    ??? Sleep apnea    ??? Degenerative cervical disc 03/12/2010   ??? Hyperlipidemia 03/21/2010   ??? GERD (gastroesophageal reflux disease) 03/29/2010   ??? Obstructive sleep apnea 05/16/2010     Current Outpatient Prescriptions   Medication Sig Dispense Refill   ??? metformin (GLUCOPHAGE) 500 MG tablet TAKE 1 TABLET TWICE A DAY FOR DIABETES  60 tablet  0   ??? omeprazole (PRILOSEC) 20 MG capsule Take 1 capsule by mouth Daily.  30 capsule  6   ??? albuterol (PROVENTIL HFA;VENTOLIN HFA) 108 (90 BASE) MCG/ACT inhaler Inhale 2 puffs into the lungs every 6 hours as needed.  1 Inhaler  2   ??? naproxen (NAPROSYN) 500 MG tablet Take 1 tablet by mouth 2 times daily (with meals).  60 tablet  3   ??? cyclobenzaprine (FLEXERIL) 10 MG tablet Take 1 tablet by mouth 3 times daily as needed.  90 tablet  3   ??? lisinopril-hydrochlorothiazide (PRINZIDE) 20-12.5 MG per tablet Take 1 tablet by mouth daily.  30 tablet  11   ??? metformin (GLUCOPHAGE-XR) 500 MG XR tablet Take 1 tablet by mouth 2 times daily.  60 tablet  3   ??? aspirin EC 81 MG EC tablet Take 1 tablet by mouth daily.  30 tablet  12   ??? Blood Glucose Monitoring Suppl (BLOOD GLUCOSE MONITOR KIT) KIT by Does not apply route.  1 kit  0   ??? Glucose Blood (BLOOD GLUCOSE TEST STRIPS) STRP Test daily  60 strip  11   ??? nabumetone (RELAFEN) 500 MG tablet Take 1 tablet by mouth 2 times daily.   60 tablet  3     No current facility-administered medications for this visit.       Family History   Problem Relation Age of Onset   ??? High Blood Pressure Mother      History     Social History   ??? Marital Status: Legally Separated     Spouse Name: N/A     Number of Children: N/A   ??? Years of Education: N/A     Occupational History   ??? Not on file.     Social History Main Topics   ??? Smoking status: Current Every Day Smoker     Types: Cigarettes   ??? Smokeless tobacco: Not on file      Comment: Cutting down, now at 8 to 7 per day.   ??? Alcohol Use: No   ??? Drug Use: No   ??? Sexual Activity: Not on file     Other Topics Concern   ??? Not on file     Social History Narrative         Review of Systems   Constitutional: Negative.  Negative for fever, chills, diaphoresis, activity change, appetite change, fatigue and unexpected weight change.   HENT: Negative.  Negative for hearing loss, ear pain, nosebleeds, congestion, facial swelling, rhinorrhea, sneezing, neck pain, neck stiffness, postnasal drip, sinus pressure and ear discharge.    Eyes: Negative.  Negative for photophobia, pain, discharge, redness, itching and visual disturbance.   Respiratory: As per HPI  Cardiovascular: Negative.  Negative for chest pain, palpitations and leg swelling.   Gastrointestinal: Negative.  Negative for abdominal pain, blood in stool, abdominal distention and anal bleeding.   Genitourinary: Negative.  Negative for dysuria, urgency, frequency, hematuria, decreased urine volume, enuresis and difficulty urinating.   Musculoskeletal: Negative.  Negative for myalgias, back pain, joint swelling, arthralgias and gait problem.   Skin: Negative.  Negative for color change and pallor.   Neurological: Negative.  Negative for dizziness, tremors, seizures, syncope, facial asymmetry, speech difficulty, weakness, light-headedness, numbness and headaches.   Hematological: Negative.  Negative for adenopathy.   Psychiatric/Behavioral: Negative.  Negative for  hallucinations, behavioral problems, confusion and agitation. The patient is not nervous/anxious and is not hyperactive.      Blood pressure 137/94, pulse 86, resp. rate 18, height 6' (1.829 m), weight 181 lb (82.101 kg), SpO2 98 %.    Physical Exam   Constitutional: Oriented. Well-developed and well-nourished. No distress.   HENT:   Head: Normocephalic and atraumatic.   Mouth/Throat: Oropharynx is clear and moist. No oropharyngeal exudate.   Eyes: Conjunctivae and extraocular motions are normal. Pupils are equal, round, and reactive to light. Right eye exhibits no discharge. Left eye exhibits no discharge.   Neck: Normal range of motion. Neck supple. No JVD present. Carotid bruit is not present. No rigidity. No tracheal deviation present. No thyromegaly present.   Cardiovascular: Normal rate, regular rhythm, S1&S2 and intact distal pulses.    Pulmonary/Chest: Effort normal and breath sounds normal. No stridor. No respiratory distress. Has no wheezes. Has no rhonchi. Has no rales. Exhibits no tenderness.   Abdominal: Soft. Bowel sounds are normal. Exhibits no shifting dullness, no distension and no mass. No tenderness. Has no rebound and no guarding.   Musculoskeletal: Normal range of motion. Exhibits no edema and no tenderness.   Lymphadenopathy:     Has no cervical adenopathy.     Has no axillary adenopathy.   Neurological: Alert and oriented. Has normal reflexes. No cranial nerve deficit. Exhibits normal muscle tone. Coordination normal.   Skin: Skin is warm and dry. No rash noted. No erythema.   Psychiatric: Has a normal mood and affect. Behavior is normal. Thought content normal.      Assessment:    1. COPD (chronic obstructive pulmonary disease)    2. Tobacco abuse    3. GERD (gastroesophageal reflux disease)              Plan:    1. I discussed with patient the above diagnosis  2. I reviewed his PFT and recent CXR report  3. I will change Q Var to Symbicort 160 BID and continue Albuterol PRN  4. Educated  about GERD related respiratory symptoms  5. If not better will consider CT chest  6. I encouraged him to quit smoking, refer to COPD clinic  7. Recommended Prevnar and annual flu vaccine  8. RTC in 3 months

## 2013-05-10 NOTE — Addendum Note (Signed)
Addended by: Abbie Sons on: 05/10/2013 03:37 PM     Modules accepted: Orders, Medications

## 2013-05-11 ENCOUNTER — Ambulatory Visit: Payer: BC Managed Care – PPO | Admitting: Orthopedic Surgery

## 2013-05-12 ENCOUNTER — Telehealth: Payer: Self-pay | Admitting: Orthopedic Surgery

## 2013-05-12 NOTE — Telephone Encounter (Signed)
Faxed notes to patient's short-term disability insurer, [employer], to Fax# [phone], Ph# [phone], on 2 separate dates, 05/03/13, and an update on 05/12/13, including dates of service 04/29/13 through 05/10/13, to attention Claims Examiner, ref Cl# [id number].  Signed authorization on file.  Patient aware of status.

## 2013-05-18 NOTE — Telephone Encounter (Signed)
Done

## 2013-06-30 NOTE — Telephone Encounter (Signed)
Called patient to confirm COPD clinic tomorrow. Left message.

## 2013-08-11 NOTE — Communication Body (Signed)
Assessment:  Assessment:    1. COPD (chronic obstructive pulmonary disease)    2. Tobacco abuse    3. SOB (shortness of breath)            Plan:  Plan:    1. I discussed with patient the above diagnosis  2. I will order CT chest, if negative he might need cardiac work up  3. I encouraged him to continue Symbicort 160 BID and Albuterol PRN  4. Educated again about GERD related respiratory symptoms  5. I encouraged him to quit smoking, refer again to COPD clinic  7. Refused Prevnar   8. RTC in 1 month

## 2013-08-11 NOTE — Progress Notes (Signed)
[name] Peck is 58 y.o. male here for F/U visit. He was seen for Pulmonary Medicine consultation referred by Dr. Holland Falling for evaluation of S.O.B  Patient has hx of COPD for the last few years. He is a smoker for the last 45 years  His recent PFT showed mild obstructive changes  He was seen last month at [facility] ED and CXR was reported normal  He does have hx of GERD  He denies any weight loss, hemoptysis or diaphoresis  He was changed to Symbicort, he used the samples but never continued the inhaler  He continues to feel S.O.B and denied improvement with Symbicort  He continues to smoke    Past Medical History   Diagnosis Date   ??? Chronic back pain    ??? Hypertension    ??? COPD (chronic obstructive pulmonary disease) ([place])    ??? Cervical pain (neck)    ??? Sleep apnea    ??? Degenerative cervical disc 03/12/2010   ??? Hyperlipidemia 03/21/2010   ??? GERD (gastroesophageal reflux disease) 03/29/2010   ??? Obstructive sleep apnea 05/16/2010     Current Outpatient Prescriptions   Medication Sig Dispense Refill   ??? budesonide-formoterol (SYMBICORT) 160-4.5 MCG/ACT AERO Inhale 2 puffs into the lungs 2 times daily.  1 Inhaler  3   ??? budesonide-formoterol (SYMBICORT) 160-4.5 MCG/ACT AERO Inhale 2 puffs into the lungs 2 times daily.  1 Inhaler  0   ??? omeprazole (PRILOSEC) 20 MG capsule Take 1 capsule by mouth Daily.  30 capsule  6   ??? albuterol (PROVENTIL HFA;VENTOLIN HFA) 108 (90 BASE) MCG/ACT inhaler Inhale 2 puffs into the lungs every 6 hours as needed.  1 Inhaler  2   ??? naproxen (NAPROSYN) 500 MG tablet Take 1 tablet by mouth 2 times daily (with meals).  60 tablet  3   ??? cyclobenzaprine (FLEXERIL) 10 MG tablet Take 1 tablet by mouth 3 times daily as needed.  90 tablet  3   ??? lisinopril-hydrochlorothiazide (PRINZIDE) 20-12.5 MG per tablet Take 1 tablet by mouth daily.  30 tablet  11   ??? metformin (GLUCOPHAGE-XR) 500 MG XR tablet Take 1 tablet by mouth 2 times daily.  60 tablet  3   ??? aspirin EC 81 MG EC tablet Take 1 tablet by mouth daily.   30 tablet  12   ??? Blood Glucose Monitoring Suppl (BLOOD GLUCOSE MONITOR KIT) KIT by Does not apply route.  1 kit  0   ??? Glucose Blood (BLOOD GLUCOSE TEST STRIPS) STRP Test daily  60 strip  11   ??? nabumetone (RELAFEN) 500 MG tablet Take 1 tablet by mouth 2 times daily.  60 tablet  3     No current facility-administered medications for this visit.       Family History   Problem Relation Age of Onset   ??? High Blood Pressure Mother      History     Social History   ??? Marital Status: Legally Separated     Spouse Name: N/A     Number of Children: N/A   ??? Years of Education: N/A     Occupational History   ??? Not on file.     Social History Main Topics   ??? Smoking status: Current Every Day Smoker     Types: Cigarettes   ??? Smokeless tobacco: Not on file      Comment: Cutting down, now at 8 to 7 per day.   ??? Alcohol Use: No   ???  Drug Use: No   ??? Sexual Activity: Not on file     Other Topics Concern   ??? Not on file     Social History Narrative         Review of Systems   Constitutional: Negative.  Negative for fever, chills, diaphoresis, activity change, appetite change, fatigue and unexpected weight change.   HENT: Negative.  Negative for hearing loss, ear pain, nosebleeds, congestion, facial swelling, rhinorrhea, sneezing, neck pain, neck stiffness, postnasal drip, sinus pressure and ear discharge.    Eyes: Negative.  Negative for photophobia, pain, discharge, redness, itching and visual disturbance.   Respiratory: As per HPI  Cardiovascular: Negative.  Negative for chest pain, palpitations and leg swelling.   Gastrointestinal: Negative.  Negative for abdominal pain, blood in stool, abdominal distention and anal bleeding.   Genitourinary: Negative.  Negative for dysuria, urgency, frequency, hematuria, decreased urine volume, enuresis and difficulty urinating.   Musculoskeletal: Negative.  Negative for myalgias, back pain, joint swelling, arthralgias and gait problem.   Skin: Negative.  Negative for color change and pallor.    Neurological: Negative.  Negative for dizziness, tremors, seizures, syncope, facial asymmetry, speech difficulty, weakness, light-headedness, numbness and headaches.   Hematological: Negative.  Negative for adenopathy.   Psychiatric/Behavioral: Negative.  Negative for hallucinations, behavioral problems, confusion and agitation. The patient is not nervous/anxious and is not hyperactive.      Blood pressure 147/107, pulse 96, temperature 98.5 ??F (36.9 ??C), temperature source Oral, resp. rate 18, height 6' (1.829 m), weight 185 lb (83.915 kg), SpO2 100 %.    Physical Exam   Constitutional: Oriented. Well-developed and well-nourished. No distress.   HENT:   Head: Normocephalic and atraumatic.   Mouth/Throat: Oropharynx is clear and moist. No oropharyngeal exudate.   Eyes: Conjunctivae and extraocular motions are normal. Pupils are equal, round, and reactive to light. Right eye exhibits no discharge. Left eye exhibits no discharge.   Neck: Normal range of motion. Neck supple. No JVD present. Carotid bruit is not present. No rigidity. No tracheal deviation present. No thyromegaly present.   Cardiovascular: Normal rate, regular rhythm, S1&S2 and intact distal pulses.    Pulmonary/Chest: Effort normal and breath sounds normal. No stridor. No respiratory distress. Has no wheezes. Has no rhonchi. Has no rales. Exhibits no tenderness.   Abdominal: Soft. Bowel sounds are normal. Exhibits no shifting dullness, no distension and no mass. No tenderness. Has no rebound and no guarding.   Musculoskeletal: Normal range of motion. Exhibits no edema and no tenderness.   Lymphadenopathy:     Has no cervical adenopathy.     Has no axillary adenopathy.   Neurological: Alert and oriented. Has normal reflexes. No cranial nerve deficit. Exhibits normal muscle tone. Coordination normal.   Skin: Skin is warm and dry. No rash noted. No erythema.   Psychiatric: Has a normal mood and affect. Behavior is normal. Thought content normal.       Assessment:    1. COPD (chronic obstructive pulmonary disease)    2. Tobacco abuse    3. SOB (shortness of breath)              Plan:    1. I discussed with patient the above diagnosis  2. I will order CT chest, if negative he might need cardiac work up  3. I encouraged him to continue Symbicort 160 BID and Albuterol PRN  4. Educated again about GERD related respiratory symptoms  5. I encouraged him to quit smoking, refer  again to COPD clinic  7. Refused Prevnar   8. RTC in 1 month

## 2013-08-20 ENCOUNTER — Encounter

## 2013-09-13 NOTE — Progress Notes (Signed)
[name] Peck is 58 y.o. male here for F/U visit. He was seen for Pulmonary Medicine consultation referred by Dr. Holland Falling for evaluation of S.O.B  Patient has hx of COPD for the last few years. He is a smoker for the last 45 years and continues to smoke  His recent PFT showed mild obstructive changes  He does have hx of GERD  He denies any weight loss, hemoptysis or diaphoresis  He has been on Symbicort and feels better  His CT chest was read as:    CT OF THE CHEST WITHOUT CONTRAST   Aug 20, 2013 12:48:26 PM      TECHNIQUE:   CT of the chest was performed without the administration of   intravenous   contrast. Multiplanar reformatted images are provided for review.      COMPARISON:   None      HISTORY:   SOB (shortness of breath)      FINDINGS:   Mediastinum: No adenopathy. The heart and pericardium are   unremarkable.      Lungs/Pleura: Calcified granuloma is noted posteriorly in the   right upper lobe. Mild findings of centrilobular emphysema present   bilaterally.      Upper Abdomen: Scattered, punctate hypodensities in the liver   noted measuring up to 5 mm.      Soft Tissues/Bones: No acute osseous injury. No soft tissue   abnormalities.            Impression   IMPRESSION:       No acute cardio pulmonary disease.      A 0 granulomas disease and mild centrilobular emphysema.      Hypodensities in the liver are too small to characterize   completely, most likely benign in the since of history of   malignancy.       Past Medical History   Diagnosis Date   ??? Chronic back pain    ??? Hypertension    ??? COPD (chronic obstructive pulmonary disease) ([place])    ??? Cervical pain (neck)    ??? Sleep apnea    ??? Degenerative cervical disc 03/12/2010   ??? Hyperlipidemia 03/21/2010   ??? GERD (gastroesophageal reflux disease) 03/29/2010   ??? Obstructive sleep apnea 05/16/2010     Current Outpatient Prescriptions   Medication Sig Dispense Refill   ??? budesonide-formoterol (SYMBICORT) 160-4.5 MCG/ACT AERO Inhale 2 puffs into the lungs 2 times  daily.  1 Inhaler  3   ??? omeprazole (PRILOSEC) 20 MG capsule Take 1 capsule by mouth Daily.  30 capsule  6   ??? albuterol (PROVENTIL HFA;VENTOLIN HFA) 108 (90 BASE) MCG/ACT inhaler Inhale 2 puffs into the lungs every 6 hours as needed.  1 Inhaler  2   ??? naproxen (NAPROSYN) 500 MG tablet Take 1 tablet by mouth 2 times daily (with meals).  60 tablet  3   ??? cyclobenzaprine (FLEXERIL) 10 MG tablet Take 1 tablet by mouth 3 times daily as needed.  90 tablet  3   ??? lisinopril-hydrochlorothiazide (PRINZIDE) 20-12.5 MG per tablet Take 1 tablet by mouth daily.  30 tablet  11   ??? metformin (GLUCOPHAGE-XR) 500 MG XR tablet Take 1 tablet by mouth 2 times daily.  60 tablet  3   ??? aspirin EC 81 MG EC tablet Take 1 tablet by mouth daily.  30 tablet  12   ??? Blood Glucose Monitoring Suppl (BLOOD GLUCOSE MONITOR KIT) KIT by Does not apply route.  1 kit  0   ???  Glucose Blood (BLOOD GLUCOSE TEST STRIPS) STRP Test daily  60 strip  11   ??? nabumetone (RELAFEN) 500 MG tablet Take 1 tablet by mouth 2 times daily.  60 tablet  3     No current facility-administered medications for this visit.       Family History   Problem Relation Age of Onset   ??? High Blood Pressure Mother      History     Social History   ??? Marital Status: Legally Separated     Spouse Name: N/A     Number of Children: N/A   ??? Years of Education: N/A     Occupational History   ??? Not on file.     Social History Main Topics   ??? Smoking status: Current Every Day Smoker     Types: Cigarettes   ??? Smokeless tobacco: Not on file      Comment: Cutting down, now smoking 10 cigarettes a day   ??? Alcohol Use: No   ??? Drug Use: No   ??? Sexual Activity: Not on file     Other Topics Concern   ??? Not on file     Social History Narrative         Review of Systems   Constitutional: Negative.  Negative for fever, chills, diaphoresis, activity change, appetite change, fatigue and unexpected weight change.   HENT: Negative.  Negative for hearing loss, ear pain, nosebleeds, congestion, facial  swelling, rhinorrhea, sneezing, neck pain, neck stiffness, postnasal drip, sinus pressure and ear discharge.    Eyes: Negative.  Negative for photophobia, pain, discharge, redness, itching and visual disturbance.   Respiratory: As per HPI  Cardiovascular: Negative.  Negative for chest pain, palpitations and leg swelling.   Gastrointestinal: Negative.  Negative for abdominal pain, blood in stool, abdominal distention and anal bleeding.   Genitourinary: Negative.  Negative for dysuria, urgency, frequency, hematuria, decreased urine volume, enuresis and difficulty urinating.   Musculoskeletal: Negative.  Negative for myalgias, back pain, joint swelling, arthralgias and gait problem.   Skin: Negative.  Negative for color change and pallor.   Neurological: Negative.  Negative for dizziness, tremors, seizures, syncope, facial asymmetry, speech difficulty, weakness, light-headedness, numbness and headaches.   Hematological: Negative.  Negative for adenopathy.   Psychiatric/Behavioral: Negative.  Negative for hallucinations, behavioral problems, confusion and agitation. The patient is not nervous/anxious and is not hyperactive.      Blood pressure 137/92, pulse 98, resp. rate 18, height 6' (1.829 m), weight 186 lb (84.369 kg), SpO2 98 %.    Physical Exam   Constitutional: Oriented. Well-developed and well-nourished. No distress.   HENT:   Head: Normocephalic and atraumatic.   Mouth/Throat: Oropharynx is clear and moist. No oropharyngeal exudate.   Eyes: Conjunctivae and extraocular motions are normal. Pupils are equal, round, and reactive to light. Right eye exhibits no discharge. Left eye exhibits no discharge.   Neck: Normal range of motion. Neck supple. No JVD present. Carotid bruit is not present. No rigidity. No tracheal deviation present. No thyromegaly present.   Cardiovascular: Normal rate, regular rhythm, S1&S2 and intact distal pulses.    Pulmonary/Chest: Effort normal and breath sounds normal. No stridor. No  respiratory distress. Has no wheezes. Has no rhonchi. Has no rales. Exhibits no tenderness.   Abdominal: Soft. Bowel sounds are normal. Exhibits no shifting dullness, no distension and no mass. No tenderness. Has no rebound and no guarding.   Musculoskeletal: Normal range of motion. Exhibits no edema and no  tenderness.   Lymphadenopathy:     Has no cervical adenopathy.     Has no axillary adenopathy.   Neurological: Alert and oriented. Has normal reflexes. No cranial nerve deficit. Exhibits normal muscle tone. Coordination normal.   Skin: Skin is warm and dry. No rash noted. No erythema.   Psychiatric: Has a normal mood and affect. Behavior is normal. Thought content normal.      Assessment:    1. COPD (chronic obstructive pulmonary disease)    2. Tobacco abuse    3. Abnormal CT scan, chest              Plan:    1. I discussed with patient the above diagnosis  2. I reviewed his CT chest and explained findings on the monitor. He does have emphysema and there is a ? Polypoid looking lesion in the trachea vs residual mucus ball. Will plan bronchoscopy for evaluation   3. Continue Symbicort 160 BID and Albuterol PRN  4. I will forward his CT chest report to PCP, he does have some liver changes which requires F/U and evaluation   5. I encouraged him to quit smoking, refer again to COPD clinic  7. RTC in 3 months

## 2013-09-13 NOTE — Communication Body (Signed)
Assessment:    1. COPD (chronic obstructive pulmonary disease)    2. Tobacco abuse    3. Abnormal CT scan, chest            Plan:    1. I discussed with patient the above diagnosis  2. I reviewed his CT chest and explained findings on the monitor. He does have emphysema and there is a ? Polypoid looking lesion in the trachea vs residual mucus ball. Will plan bronchoscopy for evaluation   3. Continue Symbicort 160 BID and Albuterol PRN  4. I will forward his CT chest report to PCP, he does have some liver changes which requires F/U and evaluation   5. I encouraged him to quit smoking, refer again to COPD clinic  7. RTC in 3 months

## 2013-09-22 NOTE — Patient Instructions (Addendum)
Name_______________________________________Printed:____________________  Date and time of surgery__8-27-15______________________Arrival Time:1030 main________________   1. Do not eat or drink anything after 12 midnight (or____hours) prior to surgery. This includes no water, chewing gum or mints.   2. Take the following pills with a small sip of water on the morning of surgery_____omeprazole, symbicort_amlodipine, cozaar_______________________________________________   3. Aspirin, Ibuprofen, Advil, Naproxen, Vitamin E and other Anti-inflammatory products should be stopped for 5 days before surgery or as directed by your physician.   4. Check with your Doctor regarding stopping Plavix, Coumadin,Eliquis, Lovenox,Effient,Pradaxa,Xarelto, Fragmin or other blood thinners and follow their instructions.   5. Do not smoke, and do not drink any alcoholic beverages 24 hours prior to surgery.  This includes NA Beer.   6. You may brush your teeth and gargle the morning of surgery.  DO NOT SWALLOW WATER   7. You MUST make arrangements for a responsible adult to take you home after your surgery. You will not be allowed to leave alone or drive yourself home.  It is strongly suggested someone stay with you the first 24 hrs. Your surgery will be cancelled if you do not have a ride home.   8. A parent/legal guardian must accompany a child scheduled for surgery and plan to stay at the hospital until the child is discharged.  Please do not bring other children with you.   9. Please wear simple, loose fitting clothing to the hospital.  Do not bring valuables (money, credit cards, checkbooks, etc.) Do not wear any makeup (including no eye makeup) or nail polish on your fingers or toes.             10. DO NOT wear any jewelry or piercings on day of surgery.  All body piercing jewelry must be removed.             11. If you have ___dentures, they will be removed before going to the OR; we will provide you a container.  If you wear  ___contact lenses or ___glasses, they will be removed; please bring a case for them.             12. Please see your family doctor/pediatrician for a history & physical and/or concerning medications.  Bring any test results/reports from your physician's office.   PCP__________________Phone___________H&P Appt. Date________             13 If you  have a Living Will and Durable Power of Attorney for Healthcare, please bring in a copy.             54. Notify your Surgeon if you develop any illness between now and surgery  time, cough, cold, fever, sore throat, nausea, vomiting, etc.  Please notify your surgeon if you experience dizziness, shortness of breath or blurred vision between now & the time of your surgery             15. DO NOT shave your operative site 96 hours prior to surgery. For face & neck surgery, men may use an electric razor 48 hours prior to surgery.             16. Shower the night before surgery with ___Antibacterial soap ___Hibiclens             17. To provide excellent care visitors will be limited to one in the room at any given time.             18.  Please bring picture ID and insurance card.  19.  Visit our web site for additional information:  [email or web address]              20.During flu season no children under the age of 69 are permitted in the hospital for the safety of all patients.                              21. If you take a long acting insulin in the evening only  take half of your usual  dose the night  before your procedure              22. If you use a c-pap please bring DOS if staying overnight,             23.For your convenience [facility] has a pharmacy on site to fill your prescriptions.             24. If you use oxygen and have a portable tank please bring it  with you the DOS             25. Other ____________________________________________     *Please call pre admission testing if you any further questions   [facility] [place]         [place]   [place]    [place] [place]  [phone]   [place]       All above information reviewed with patient in person or by phone.Patient verbalizes understanding.All questions and concerns addressed.                                                                                                 Patient/Rep____________________                                                                                                                                    PRE OP INSTRUCTIONS

## 2013-09-23 LAB — CBC
Hematocrit: 46.6 % (ref 40.5–52.5)
Hemoglobin: 15.1 g/dL (ref 13.5–17.5)
MCH: 31.8 pg (ref 26.0–34.0)
MCHC: 32.4 g/dL (ref 31.0–36.0)
MCV: 98.2 fL (ref 80.0–100.0)
MPV: 9.2 fL (ref 5.0–10.5)
Platelets: 146 10*3/uL (ref 135–450)
RBC: 4.75 M/uL (ref 4.20–5.90)
RDW: 15 % (ref 12.4–15.4)
WBC: 6.3 10*3/uL (ref 4.0–11.0)

## 2013-09-23 LAB — EKG 12-LEAD
Atrial Rate: 75 {beats}/min
P Axis: 78 degrees
P-R Interval: 140 ms
Q-T Interval: 396 ms
QRS Duration: 92 ms
QTc Calculation (Bazett): 442 ms
R Axis: 72 degrees
T Axis: 85 degrees
Ventricular Rate: 75 {beats}/min

## 2013-09-23 LAB — PROTIME-INR
INR: 1.07 (ref 0.85–1.16)
Protime: 11.6 s (ref 9.1–12.6)

## 2013-09-23 LAB — BASIC METABOLIC PANEL
Anion Gap: 13 (ref 3–16)
BUN: 8 mg/dL (ref 7–20)
CO2: 23 mmol/L (ref 21–32)
Calcium: 9.3 mg/dL (ref 8.3–10.6)
Chloride: 101 mmol/L (ref 99–110)
Creatinine: 0.8 mg/dL — ABNORMAL LOW (ref 0.9–1.3)
GFR African American: >60 (ref >60)
GFR Non-African American: >60 (ref >60)
Glucose: 113 mg/dL — ABNORMAL HIGH (ref 70–99)
Potassium: 4.7 mmol/L (ref 3.5–5.1)
Sodium: 137 mmol/L (ref 136–145)

## 2013-09-23 LAB — POCT GLUCOSE
POC Glucose: 90 mg/dl (ref 70–99)
POC Glucose: 90 mg/dl (ref 70–99)

## 2013-09-23 LAB — APTT: aPTT: 35.4 s (ref 23.1–36.1)

## 2013-09-23 MED ADMIN — 0.9 % sodium chloride infusion: INTRAVENOUS | @ 15:00:00 | NDC 00338004904

## 2013-09-23 MED FILL — LIDOCAINE HCL 4 % EX SOLN: 4 % | CUTANEOUS | Qty: 50

## 2013-09-23 MED FILL — SODIUM CHLORIDE 0.9 % IV SOLN: 0.9 % | INTRAVENOUS | Qty: 1000

## 2013-09-23 MED FILL — LIDOCAINE HCL 2 % EX GEL: 2 % | CUTANEOUS | Qty: 5

## 2013-09-23 NOTE — Anesthesia Pre-Procedure Evaluation (Signed)
[name]     Anesthesia Evaluation     Patient summary reviewed and Nursing notes reviewed    No history of anesthetic complications   Airway   Mallampati: III  TM distance: >3 FB  Neck ROM: full  Dental    Comment: Multiple missing teeth    Pulmonary     breath sounds clear to auscultation  (+) COPD, sleep apnea noncompliant with therapy, decreased breath sounds,   (-) pneumonia, asthma, shortness of breath, recent URI, rhonchi, wheezes, rales, stridor  ROS comment: Lung granuloma  Cardiovascular   Exercise tolerance: poor  (+) hypertension well controlled,   (-) pacemaker, valvular problems/murmurs, past MI, CAD, CABG/stent, dysrhythmias, angina, CHF, orthopnea, PND, DOE, murmur, weak pulses, friction rub, systolic click, carotid bruits, JVD, peripheral edema    ECG reviewed  Rhythm: regular  Rate: normal  Beta Blocker:  Not on Beta Blocker    Neuro/Psych    (+) psychiatric history  (-) seizures, neuromuscular disease, TIA, CVA, headaches  GI/Hepatic/Renal    (+) GERD well controlled,   (-) hiatal hernia, PUD, hepatitis, liver disease, renal disease, bowel prep    Endo/Other    (+) type II diabetes well controlled  (-) no type I diabetes, hypothyroidism, hyperthyroidism, blood dyscrasia, arthritis  Abdominal   (-) obese                  Allergies: Penicillins    NPO Status: Time of last liquid consumption: 0000                       Time of last solid food consumption: 0000            Date of last liquid consumption: 09/23/13            Date of last solid food consumption: 09/23/13    Anesthesia Plan    ASA 3     MAC   (TIVA with standard monitors Risks and Benefits explained answered all questions)  intravenous induction   Anesthetic plan and risks discussed with patient.  Use of blood products discussed with patient whom consented to blood products.   Plan discussed with CRNA.          Grace Isaac, MD  09/23/2013

## 2013-09-23 NOTE — Progress Notes (Signed)
Patient arrived to SDS from PACU via cart; report received from [facility].  Patient alert and oriented, VSS, reports no pain or discomfort at this time.  Patient with congested, productive cough.  Tolerating ice chips well.  Will continue to monitor.  Thea Gist

## 2013-09-23 NOTE — Progress Notes (Signed)
Patient discharged and transported via wheelchair to the front of hospital via wheelchair by Gertie Fey.  Patient tolerating ice chips and sips of water.  Patient to be transported home by girlfriend and mother.  Thea Gist

## 2013-09-23 NOTE — Progress Notes (Signed)
Discharge instructions reviewed and understanding verbalized per pt/family with copy given. All home medications/new prescriptions have been reviewed, questions answered and patient/family state understanding. Elise M Morgan

## 2013-09-23 NOTE — Progress Notes (Addendum)
Teaching / education initiated regarding perioperative experience, expectations, and pain management during stay. Patient verbalized understanding.Report given to endoscopy  RN to complete hand-off procedure.

## 2013-09-23 NOTE — Progress Notes (Signed)
From endo awake, respirations easy, denies pain

## 2013-09-23 NOTE — H&P (Signed)
Patient was seen and examined, no changes since office visit. Plan for diagnostic bronchoscopy.

## 2013-09-23 NOTE — Anesthesia Post-Procedure Evaluation (Signed)
Anesthesia Post-op Note    Patient: Angelina Neece    Procedure(s) Performed: BRONCHOSCOPY    Anesthesia type: MAC    Patient location: PACU    Post-op pain: Adequate analgesia    Post-op assessment: no apparent anesthetic complications, tolerated procedure well and no evidence of recall    Last Vitals: BP 133/90 mmHg   Pulse 82   Temp(Src) 98.3 ??F (36.8 ??C) (Temporal)   Resp 18   Wt 185 lb 13.6 oz (84.3 kg)   SpO2 99%    Post-op vital signs: stable    Aldrete Phase I: Aldrete Score: 10    Aldrete Phase II: Aldrete Score: 10    Post-op Nausea & Vomiting: no nausea and no vomiting    Post-op Hydration: euvolemic    Level of consciousness: awake, alert  and oriented    Complications: none    Grace Isaac, MD  1:49 PM

## 2013-09-23 NOTE — Op Note (Signed)
Bronchoscopy Procedure Note    Date of Operation: 09/23/2013    Pre-op Diagnosis: abnormal trachea on CT chest    Post-op Diagnosis: normal    Surgeon: Lacretia Leigh    Anesthesia: Monitored Local Anesthesia with Sedation per anesthesia    Operation: Flexible fiberoptic bronchoscopy, diagnostic     Estimated Blood Loss: None    Complications: None    Indications and History:  The patient is a 58 y.o. male with abnormal trachea on CT chest ? lesion.  The risks, benefits, complications, treatment options and expected outcomes were discussed with the patient.  The possibilities of reaction to medication, pulmonary aspiration, perforation of a viscus, bleeding, failure to diagnose a condition and creating a complication requiring transfusion or operation were discussed with the patient who freely signed the consent.      Description of Procedure:  The patient was taken to endoscopy suite, identified as [name] and the procedure verified as Flexible Fiberoptic Bronchoscopy.  A Time Out was held and the above information confirmed.     After the induction of topical nasopharyngeal anesthesia, the patient was placed in appropriate position and the bronchoscope was passed through the oral cavity . The vocal cords were visualized and total of 3 ml of 2% Lidocaine was topically placed onto the cords. The cords were normal. The scope was then passed into the trachea. Lidocaine 2% 3 ml was used topically on the carina.  Careful inspection of the tracheal lumen was accomplished. The scope was sequentially passed into the left main and then left upper and lower bronchi and segmental bronchi. *    The scope was then withdrawn and advanced into the right main bronchus and then into the RUL, RML, and RLL bronchi and segmental bronchi.     Endobronchial findings:   Trachea: Normal mucosa  Carina: Normal mucosa  Right main bronchus: Normal mucosa  Right upper lobe bronchus: Normal mucosa  Right Middle lobe bronchus: Normal  mucosa  Right Lower lobe bronchus: Normal mucosa  Left main bronchus: Normal mucosa, with large rounded mucus plug was suctioned which might explain what seen on CT chest  Left upper lobe bronchus: Normal mucosa  Left lower lobe bronchus: Normal mucosa    The Patient was taken to the Endoscopy Recovery area in satisfactory condition.    Recommendation:    1. F/U on culturs results  2. F/U on cytology results    Attestation: I performed the procedure.    Lacretia Leigh

## 2013-09-23 NOTE — Progress Notes (Signed)
Scope number verified for HLD via label printout prior to use. H and P and consent completed. Verified using 2 person system.

## 2013-10-28 ENCOUNTER — Encounter: Attending: Acute Care | Primary: Family Medicine

## 2013-11-08 ENCOUNTER — Ambulatory Visit: Admit: 2013-11-08 | Payer: MEDICAID

## 2013-11-08 ENCOUNTER — Ambulatory Visit: Admit: 2013-11-08 | Payer: MEDICAID | Attending: Gastroenterology

## 2013-11-08 DIAGNOSIS — K219 Gastro-esophageal reflux disease without esophagitis: Secondary | ICD-10-CM

## 2013-11-08 LAB — ALPHA-1-ANTITRYPSIN (AAT) QUANTITATION & MUTATION: A-1 Antitrypsin: 154 mg/dL (ref 90–200)

## 2013-11-08 LAB — HEPATITIS A ANTIBODY TOTAL: Hep A IgG: NONREACTIVE

## 2013-11-08 LAB — DRUG ABUSE PANEL, SERUM
Amphetamines, Serum: NEGATIVE
Barbiturates, Serum: NEGATIVE
Benzodiazepine, Serum: NEGATIVE
Cocaine and Metabolites, Serum: NEGATIVE
Marijuana Metabolite, Serum: NEGATIVE
Methadone, Serum: NEGATIVE
Opiates, Serum: NEGATIVE
Oxycodone: NEGATIVE
Phencyclidine, Serum: NEGATIVE
Propoxyphene Screen, Serum: NEGATIVE

## 2013-11-08 LAB — COMPREHENSIVE METABOLIC PANEL, SERUM
ALT: 76 U/L (ref 7–52)
AST (SGOT): 52 U/L (ref 13–39)
Albumin: 4.1 g/dL (ref 3.5–5.7)
Alkaline Phosphatase: 131 U/L (ref 36–125)
Anion Gap: 7 mmol/L (ref 3–16)
BUN: 9 mg/dL (ref 7–25)
CO2: 28 mmol/L (ref 21–33)
Calcium: 9.4 mg/dL (ref 8.6–10.3)
Chloride: 103 mmol/L (ref 98–110)
Creatinine: 1.05 mg/dL (ref 0.60–1.30)
GFR MDRD Af Amer: 88 See note.
GFR MDRD Non Af Amer: 73 See note.
Glucose: 59 mg/dL (ref 70–100)
Osmolality, Calculated: 282 mOsm/kg (ref 278–305)
Potassium: 4 mmol/L (ref 3.5–5.3)
Sodium: 138 mmol/L (ref 133–146)
Total Bilirubin: 0.9 mg/dL (ref 0.0–1.5)
Total Protein: 7.7 g/dL (ref 6.4–8.9)

## 2013-11-08 LAB — CBC
Hematocrit: 45.6 % (ref 38.5–50.0)
Hemoglobin: 15.6 g/dL (ref 13.2–17.1)
MCH: 32.9 pg (ref 27.0–33.0)
MCHC: 34.1 g/dL (ref 32.0–36.0)
MCV: 96.5 fL (ref 80.0–100.0)
MPV: 10.5 fL (ref 7.5–11.5)
Platelets: 151 10*3/uL (ref 140–400)
RBC: 4.73 10*6/uL (ref 4.20–5.80)
RDW: 14.2 % (ref 11.0–15.0)
WBC: 6 10*3/uL (ref 3.8–10.8)

## 2013-11-08 LAB — IRON STUDIES
% Iron Saturation: 37.7 % (ref 15.0–55.0)
Iron: 137 ug/dL (ref 50–212)
TIBC: 363 ug/dL (ref 261–462)

## 2013-11-08 LAB — HEPATITIS B SURFACE ANTIGEN
Hep B Surface Ag: REACTIVE
Hepatitis B Surface Ag Confirm: POSITIVE

## 2013-11-08 LAB — FERRITIN: Ferritin: 63.7 ng/mL (ref 23.9–336.2)

## 2013-11-08 LAB — HEPATITIS C ANTIBODY
HCV Ab: NONREACTIVE
HCVAB Number: 0.15 S/CO (ref 0.00–0.79)

## 2013-11-08 LAB — ANTIMITOCHONDRIAL ANTIBODY: Anti-Mitochon Ab IFA: <20 Units (ref 0.0–20.0)

## 2013-11-08 LAB — ANA W/REFLEX TO IFA/COMP PANEL
ANA Ratio: 0.2 ratio (ref 0.00–0.99)
ANA Screen: NEGATIVE

## 2013-11-08 LAB — HEPATITIS B SURFACE ANTIBODY, QUANTITATIVE
HBSAB NUMBER: <8 m[IU]/mL (ref 0.00–7.99)
Hep B S Ab: NONREACTIVE

## 2013-11-08 LAB — F-ACTIN SMOOTH MUSCLE AB: F-Actin Smooth Muscle IgG: 24 Units (ref 0–19)

## 2013-11-08 LAB — TRANSFERRIN: Transferrin: 272 mg/dL (ref 203–362)

## 2013-11-08 MED ORDER — omeprazole (PRILOSEC) 20 MG capsule
20 | ORAL_CAPSULE | Freq: Two times a day (BID) | ORAL | Status: AC
Start: 2013-11-08 — End: 2013-12-08

## 2013-11-08 NOTE — Unmapped (Signed)
Chief Complaint   Patient presents with   ??? Increased liver enzymes   ??? Gastroesophageal Reflux   ??? Prostate Issues     [place]  [address]  [place], [place] [address]       History of Present Illness:    Acid reflux for total of 10 years  used Prilosec once daily   Takes acid medication before meals   Coffee - 1 cup per day, chocolate - every now and day, mint gums  Alcohol -  none  Soda - 2-3 cans, regular pepsi  Smoking - none   Sleeps with 2 pillows at night to prop up  Sometime sits up at night time due to bad acid reflux   Has bad acid reflux at night    Acid controlled 50% on the pill     Occasional dysphagia to solids   No black stools  No weight loss    Never had upper endoscopy     Saw that he had abnormal liver enzymes with Dr.Doriott and sent to GI   No drug use now, no liver problems that he knows of   Used cocaine until 2 years ago - smoking it, denies snorting it anytime  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    Patient Active Problem List    Diagnosis   ??? Personal history of other specified diseases         Medications:  Current outpatient prescriptions:ALBUTEROL INHL, Inhale into the lungs., Disp: , Rfl: ;  amLODIPine (NORVASC) 10 MG tablet, Take 10 mg by mouth daily, Disp: , Rfl: ;  beclomethasone (QVAR) 80 mcg/actuation inhaler, Inhale 1 puff into the lungs 2 times a day., Disp: , Rfl: ;  blood sugar diagnostic Strp, Test daily, Disp: , Rfl: ;  blood-glucose meter Misc, by Does not apply route., Disp: , Rfl:   budesonide-formoterol (SYMBICORT) 160-4.5 mcg/actuation inhaler, Inhale 2 puffs into the lungs 2 times daily., Disp: , Rfl: ;  buPROPion SR (WELLBUTRIN SR) 100 MG tablet, Take 100 mg by mouth 2 times daily Ran out of prescription, Disp: , Rfl: ;   cloNIDine (CATAPRES) 0.2 mg/24 hr patch, Place 1 patch onto the skin once a week., Disp: , Rfl: ;  cyclobenzaprine (FLEXERIL) 10 MG tablet, Take 1 tablet by mouth 3 times daily as needed., Disp: , Rfl:   FLUoxetine (PROZAC) 20 MG capsule, Take 20 mg by mouth daily Ran out of prescription, Disp: , Rfl: ;  hydrochlorothiazide (HYDRODIURIL) 25 MG tablet, Take 25 mg by mouth daily., Disp: , Rfl: ;  loratadine 10 mg Cap, Take 1 tablet by mouth daily, Disp: , Rfl: ;  losartan (COZAAR) 100 MG tablet, Take 100 mg by mouth daily, Disp: , Rfl: ;  metFORMIN (GLUCOPHAGE) 500 MG tablet, Take 500 mg by mouth 2 times a day with meals., Disp: , Rfl:   nabumetone (RELAFEN) 500 MG tablet, Take 1 tablet by mouth 2 times daily., Disp: , Rfl: ;  naproxen (NAPROSYN) 500 MG tablet, Take 1 tablet by mouth 2 times daily (with meals)., Disp: , Rfl: ;  omeprazole (PRILOSEC) 20 MG capsule, Take 1 capsule by mouth Daily., Disp: , Rfl: ;    oxyCODONE-acetaminophen (PERCOCET) 10-325 mg per tablet, , Disp: , Rfl: ;  tamsulosin (FLOMAX) 0.4 mg Cp24, Take 0.4 mg by mouth daily, Disp: , Rfl:     Allergies:  Penicillins    Family History:  His family history is not on file.    Past Surgical History:  He has past surgical history that includes Hernia repair and Foot surgery (Left).    Social History:  He reports that he has been smoking Cigarettes.  He has been smoking about 0.25 packs per day. He does not have any smokeless tobacco history on file. He reports that he uses illicit drugs (Marijuana) about twice per week. He reports that he does not drink alcohol.      Review of Systems:  * See scanned Review of Systems sheet.    Vital Signs:  Blood pressure 158/90, pulse 92, resp. rate 16, height 5' 11 (1.803 m), weight 186 lb 9.6 oz (84.641 kg).  Physical Exam   Constitutional: He appears well-developed and well-nourished.   HENT:   Head: Normocephalic and atraumatic.   Eyes: Pupils are equal, round, and reactive to light. Right eye exhibits no  discharge. Left eye exhibits no discharge. No scleral icterus.   Neck: No JVD present. No tracheal deviation present.   Cardiovascular: Normal rate and regular rhythm.    Pulmonary/Chest: Effort normal and breath sounds normal.   Abdominal: Soft. Bowel sounds are normal. There is no tenderness. There is no rebound.   Musculoskeletal: He exhibits no edema and no tenderness.   Neurological: No cranial nerve deficit. Coordination normal.   Skin: Skin is warm and dry. No rash noted. No erythema.   Psychiatric: He has a normal mood and affect. His behavior is normal.       Review of Lab Results:  Lab Results   Component Value Date    WBC 5.2 04/22/2013    HGB 14.9 04/22/2013    HCT 44.3 04/22/2013    MCV 96.9 04/22/2013    PLT 126* 04/22/2013    CREATININE 1.03 04/22/2013    BUN 9 04/22/2013    NA 134 04/22/2013    K 3.9 04/22/2013    CL 102 04/22/2013    CO2 29 04/22/2013            Assessment & Plan:    GERD  Abnormal liver enzymes   H/o Cocaine use 2 years ago  Remote Marijuana use   Last colonoscopy 5 years ago per patient report, normal, repeat in 5 years from now     EGD to assess for esophagitis/Barrett's/hiatal hernia   Risks,benefits and alternatives discussed with patient   Explained risks including risk of bowel perforation, risk of bleeding with therapeutic procedure and rare allergy to medications used for sedation    Change PPI 2 times daily   Stop chocolate, mint and soda  Counseled on avoiding coffee,chocolate, alcohol,fatty foods, mint containing products which can make GERD worse  Take PPI on empty stomach, about 30 mins prior to meals   Avoid soda and any other carbonated beverages   Raise the head of the bed six to eight inches prior to sleeping  Avoid large and late meals, eat dinner atleast 3 hours prior to bed time   Avoid tight fitting clothing   GERD diet information given to patient    Chronic liver workup   U/S abdomen  Need liver test results from Dr.Doriott  Will repeat CBC/CMP  Drug screen    F/u 2  weeks after   EGD

## 2013-11-08 NOTE — Unmapped (Signed)
Addended by: Isaiah Serge on: 11/08/2013 11:37 AM     Modules accepted: Orders

## 2013-11-08 NOTE — Unmapped (Signed)
Records received, given to Dr. Rayapudi for review.

## 2013-11-08 NOTE — Unmapped (Signed)
Labs  U/s  EGD with mac  F/u 2 weeks after

## 2013-11-08 NOTE — Unmapped (Signed)
Chief Complaint   Patient presents with   ??? Increased liver enzymes   ??? Gastroesophageal Reflux   ??? Prostate Issues     Danny Miles Doriott, DO  [address]  [place], [place] [address]       History of Present Illness:    Acid reflux for total of 10 years  used Prilosec once daily   Takes acid medication before meals   Coffee - 1 cup per day, chocolate - every now and day, mint gums  Alcohol -  none  Soda - 2-3 cans, regular pepsi  Smoking - none   Sleeps with 2 pillows at night to prop up  Sometime sits up at night time due to bad acid reflux   Has bad acid reflux at night    Acid controlled 50% on the pill     Occasional dysphagia to solids   No black stools  No weight loss    Never had upper endoscopy     Saw that he had abnormal liver enzymes with Dr.Doriott and sent to GI   No drug use now, no liver problems that he knows of   Used cocaine until 2 years ago - smoking it, denies snorting it anytime  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    Patient Active Problem List    Diagnosis   ??? Personal history of other specified diseases         Medications:  Current outpatient prescriptions:ALBUTEROL INHL, Inhale into the lungs., Disp: , Rfl: ;  amLODIPine (NORVASC) 10 MG tablet, Take 10 mg by mouth daily, Disp: , Rfl: ;  beclomethasone (QVAR) 80 mcg/actuation inhaler, Inhale 1 puff into the lungs 2 times a day., Disp: , Rfl: ;  blood sugar diagnostic Strp, Test daily, Disp: , Rfl: ;  blood-glucose meter Misc, by Does not apply route., Disp: , Rfl:   budesonide-formoterol (SYMBICORT) 160-4.5 mcg/actuation inhaler, Inhale 2 puffs into the lungs 2 times daily., Disp: , Rfl: ;  buPROPion SR (WELLBUTRIN SR) 100 MG tablet, Take 100 mg by mouth 2 times daily Ran out of prescription, Disp: , Rfl: ;   cloNIDine (CATAPRES) 0.2 mg/24 hr patch, Place 1 patch onto the skin once a week., Disp: , Rfl: ;  cyclobenzaprine (FLEXERIL) 10 MG tablet, Take 1 tablet by mouth 3 times daily as needed., Disp: , Rfl:   FLUoxetine (PROZAC) 20 MG capsule, Take 20 mg by mouth daily Ran out of prescription, Disp: , Rfl: ;  hydrochlorothiazide (HYDRODIURIL) 25 MG tablet, Take 25 mg by mouth daily., Disp: , Rfl: ;  loratadine 10 mg Cap, Take 1 tablet by mouth daily, Disp: , Rfl: ;  losartan (COZAAR) 100 MG tablet, Take 100 mg by mouth daily, Disp: , Rfl: ;  metFORMIN (GLUCOPHAGE) 500 MG tablet, Take 500 mg by mouth 2 times a day with meals., Disp: , Rfl:   nabumetone (RELAFEN) 500 MG tablet, Take 1 tablet by mouth 2 times daily., Disp: , Rfl: ;  naproxen (NAPROSYN) 500 MG tablet, Take 1 tablet by mouth 2 times daily (with meals)., Disp: , Rfl: ;  omeprazole (PRILOSEC) 20 MG capsule, Take 1 capsule by mouth Daily., Disp: , Rfl: ;  oxyCODONE-acetaminophen (PERCOCET) 10-325 mg per tablet, , Disp: , Rfl: ;  tamsulosin (FLOMAX) 0.4 mg Cp24, Take 0.4 mg by mouth daily, Disp: , Rfl:     Allergies:  Penicillins    Family History:  His family history is not on file.    Past Surgical History:  He has past surgical history that includes Hernia repair and Foot surgery (Left).    Social History:  He reports that he has been smoking Cigarettes.  He has been smoking about 0.25 packs per day. He does not have any smokeless tobacco history on file. He reports that he uses illicit drugs (Marijuana) about twice per week. He reports that he does not drink alcohol.      Review of Systems:  * See scanned Review of Systems sheet.    Vital Signs:  Blood pressure 158/90, pulse 92, resp. rate 16, height 5' 11 (1.803 m), weight 186 lb 9.6 oz (84.641 kg).  Physical Exam   Constitutional: He appears well-developed and well-nourished.   HENT:   Head: Normocephalic and atraumatic.   Eyes: Pupils are equal, round, and reactive to light. Right eye exhibits no  discharge. Left eye exhibits no discharge. No scleral icterus.   Neck: No JVD present. No tracheal deviation present.   Cardiovascular: Normal rate and regular rhythm.    Pulmonary/Chest: Effort normal and breath sounds normal.   Abdominal: Soft. Bowel sounds are normal. There is no tenderness. There is no rebound.   Musculoskeletal: He exhibits no edema and no tenderness.   Neurological: No cranial nerve deficit. Coordination normal.   Skin: Skin is warm and dry. No rash noted. No erythema.   Psychiatric: He has a normal mood and affect. His behavior is normal.       Review of Lab Results:  Lab Results   Component Value Date    WBC 5.2 04/22/2013    HGB 14.9 04/22/2013    HCT 44.3 04/22/2013    MCV 96.9 04/22/2013    PLT 126* 04/22/2013    CREATININE 1.03 04/22/2013    BUN 9 04/22/2013    NA 134 04/22/2013    K 3.9 04/22/2013    CL 102 04/22/2013    CO2 29 04/22/2013            Assessment & Plan:    GERD  Abnormal liver enzymes   H/o Cocaine use 2 years ago  Remote Marijuana use   Last colonoscopy 5 years ago per patient report, normal, repeat in 5 years from now     EGD to assess for esophagitis/Barrett's/hiatal hernia   Risks,benefits and alternatives discussed with patient   Explained risks including risk of bowel perforation, risk of bleeding with therapeutic procedure and rare allergy to medications used for sedation    Change PPI 2 times daily   Stop chocolate, mint and soda  Counseled on avoiding coffee,chocolate, alcohol,fatty foods, mint containing products which can make GERD worse  Take PPI on empty stomach, about 30 mins prior to meals   Avoid soda and any other carbonated beverages   Raise the head of the bed six to eight inches prior to sleeping  Avoid large and late meals, eat dinner atleast 3 hours prior to bed time   Avoid tight fitting clothing   GERD diet information given to patient    Chronic liver workup   U/S abdomen  Need liver test results from Dr.Doriott  Will repeat CBC/CMP  Drug screen    F/u 2  weeks after  EGD

## 2013-11-12 MED ORDER — SYMBICORT 160-4.5 MCG/ACT IN AERO
RESPIRATORY_TRACT | Status: DC
Start: 2013-11-12 — End: 2013-12-13

## 2013-11-18 NOTE — Unmapped (Signed)
Patient state he's returning call to the office, can contact at [phone] (home) .  Patient has a procedure tomorrow.

## 2013-11-18 NOTE — Unmapped (Signed)
Called pt had to LVM. Gave Sara's number for him to call back

## 2013-11-19 ENCOUNTER — Other Ambulatory Visit: Admit: 2013-11-19 | Payer: MEDICAID

## 2013-11-19 DIAGNOSIS — B181 Chronic viral hepatitis B without delta-agent: Secondary | ICD-10-CM

## 2013-11-19 LAB — IGG: IgG: 1490 mg/dL (ref 751.0–1560.0)

## 2013-11-19 LAB — HBV GENOTYPE/PRECORE

## 2013-11-19 LAB — HEPATITIS B CORE IGM: Hep B Core IgM: NONREACTIVE

## 2013-11-19 LAB — HEPATITIS B CORE ANTIBODY: Hep B Core Total Ab: REACTIVE

## 2013-11-19 LAB — HEPATITIS B E ANTIGEN: Hep B E Ag: POSITIVE — AB

## 2013-11-19 LAB — HBV QUANT RFX TO GENOTYPE
HBV as IU/mL: 62208749 IU/mL
log 10 HBV as IU/mL: 7.79 log 10 IU/mL

## 2013-11-19 LAB — POC GLU MONITORING DEVICE: POC Glucose Monitoring Device: 100 mg/dL (ref 65–99)

## 2013-11-19 LAB — HEPATITIS B E ANTIBODY: Hep B E Ab: NEGATIVE

## 2013-11-19 MED ORDER — lidocaine (PF) 20 mg/mL (2 %) Soln
20 | INTRAVENOUS | Status: AC | PRN
Start: 2013-11-19 — End: 2013-11-19
  Administered 2013-11-19: 19:00:00 50 via INTRAVENOUS

## 2013-11-19 MED ORDER — propofol 10 mg/ml (DIPRIVAN) INFUSION 200 mg
10 | INTRAVENOUS | Status: AC | PRN
Start: 2013-11-19 — End: 2013-11-19
  Administered 2013-11-19: 19:00:00 120 mg via INTRAVENOUS

## 2013-11-19 MED ORDER — sodium chloride 0.9 % infusion
INTRAVENOUS | Status: AC
Start: 2013-11-19 — End: 2013-11-19
  Administered 2013-11-19: 19:00:00 via INTRAVENOUS
  Administered 2013-11-19: 18:00:00 50 mL/h via INTRAVENOUS

## 2013-11-19 MED ORDER — propofol 10 mg/ml (DIPRIVAN) injection
10 | INTRAVENOUS | Status: AC | PRN
Start: 2013-11-19 — End: 2013-11-19
  Administered 2013-11-19 (×2): 50 via INTRAVENOUS

## 2013-11-19 MED FILL — SODIUM CHLORIDE 0.9 % INTRAVENOUS SOLUTION: 50.00 50.00 mL/hr | INTRAVENOUS | Qty: 1000

## 2013-11-19 NOTE — Unmapped (Signed)
UPPER ENDOSCOPY REPORT     Ref:   ELIZABETH A DORIOTT, DO    INDICATION(S):   Gastroesophageal reflux Disease    CONSENT: Full informed consent explaining the risks, benefits, and   alternatives was obtained from the patient prior to the procedure.     SEDATION:   IV Propofol using monitored anesthesia care     DETAILS OF PROCEDURE(S):   The patient was laid in the left lateral position for the procedure.   Adult EGD scope was utilized and passed through the mouth, esophagus, stomach, past the pylorus into the second portion of the duodenum and withdrawn carefully examining the mucosa. Retroflexion was performed in the stomach to visualize the fundus. Patient tolerated the procedure well.       FINDINGS:     Duodenum appeared normal to the extent examined.     Stomach was normal in appearance.    The diaphragmatic pinch was located at 46 cm. Z-line was located at 44 cm from incisors.     Hiatal hernia was noted measuring 2 cm.       RECOMMENDATIONS:     Further workup for his Hepatitis B surface antigen positivity was ordered   Regular diet   Continue PPI  Avoid NSAIDs  Antiplatelet agents/anticoagulants can be resumed tomorrow (if taking any)  Findings were discussed with the patient and family after the procedure   Written discharge instructions were provided to the patient.   Follow-up in GI office as previously scheduled       I have reviewed the notes done by the medical scribe Ms.Dewayne Hatch, I CONCUR with her documentation of [name].      Charlane Ferretti, M.D.

## 2013-11-19 NOTE — Unmapped (Signed)
Endoscopy  Care After  Please read the instructions outlined below and refer to this sheet in the next few weeks. These discharge instructions provide you with general information on caring for yourself after you leave the hospital. Your doctor may also give you specific instructions. While your treatment has been planned according to the most current medical practices available, unavoidable complications occasionally occur. If you have any problems or questions after discharge, please call your doctor.  ACTIVITY  ?? You may resume your regular activity but move at a slower pace for the next 24 hours.   ?? Take frequent rest periods for the next 24 hours.   ?? Walking will help expel (get rid of) the air and reduce the bloated feeling in your abdomen.   ?? No driving for 24 hours (because of the anesthesia (medicine) used during the test).   ?? You may shower.   ?? Do not sign any important legal documents or operate any machinery for 24 hours (because of the anesthesia used during the test).   NUTRITION  ?? Drink plenty of fluids.   ?? You may resume your normal diet.   ?? Begin with a light meal and progress to your normal diet.   ?? Avoid alcoholic beverages for 24 hours or as instructed by your caregiver.   MEDICATIONS  ?? You may resume your normal medications unless your caregiver tells you otherwise.   WHAT YOU CAN EXPECT TODAY  ?? You may experience abdominal discomfort such as a feeling of fullness or ???gas??? pains.   FOLLOW-UP  ?? Your doctor will discuss the results of your test with you.   SEEK IMMEDIATE MEDICAL ATTENTION IF ANY OF THE FOLLOWING OCCUR:  ?? Excessive nausea (feeling sick to your stomach) and/or vomiting.   ?? Severe abdominal pain and distention (swelling).   ?? Trouble swallowing.   ?? Temperature over 100?? F (37.8?? C).   ?? Rectal bleeding or vomiting of blood.

## 2013-11-19 NOTE — Unmapped (Signed)
Anesthesia Post Note    Patient: [name]    Procedure(s) Performed: Procedure(s):  ESOPHAGOGASTRODUODENOSCOPY WITH MAC    Anesthesia type: MAC    Patient location: Same Day Surgery    Post pain: Adequate analgesia    Post assessment: no apparent anesthetic complications      Post vital signs: stable    Level of consciousness: awake    Complications: None

## 2013-11-19 NOTE — Unmapped (Signed)
[facility]  DEPARTMENT OF ANESTHESIOLOGY  PRE-PROCEDURAL EVALUATION    [name] is a 58 y.o. year old male presenting for:    Procedure(s):  ESOPHAGOGASTRODUODENOSCOPY WITH MAC    Surgeon:   Charlane Ferretti, MD    Chief Complaint     <principal problem not specified>    Review of Systems     Anesthesia Evaluation    Patient summary reviewed.  All other systems reviewed and are negative.     No history of anesthetic complications         Cardiovascular:    Hypertension is.    (-) CAD, angina.    Neuro/Muscoloskeletal/Psych:      (-) seizures, CVA.     Pulmonary:    Mild COPD.        GI/Hepatic/Renal:    (+) liver disease (mildly elevated LFTs).  GERD is.    (-) renal disease.    Endo/Other:    Diabetes, type 2.        Past Medical History     Past Medical History   Diagnosis Date   ??? Hypertension    ??? Diabetes mellitus    ??? COPD (chronic obstructive pulmonary disease)        Past Surgical History     Past Surgical History   Procedure Laterality Date   ??? Hernia repair     ??? Foot surgery Left        Family History     No family history on file.    Social History     History     Social History   ??? Marital Status: Legally Separated     Spouse Name: N/A     Number of Children: N/A   ??? Years of Education: N/A     Occupational History   ??? Not on file.     Social History Main Topics   ??? Smoking status: Current Every Day Smoker -- 0.25 packs/day     Types: Cigarettes   ??? Smokeless tobacco: Not on file   ??? Alcohol Use: No   ??? Drug Use: 2.00 per week     Special: Marijuana   ??? Sexual Activity: Yes     Partners: Female     Other Topics Concern   ??? Not on file     Social History Narrative   ??? No narrative on file       Medications     Allergies:  Allergies   Allergen Reactions   ??? Penicillins Hives       Home Meds:  Prior to Admission medications as of 11/08/13 1109   Medication Sig Taking?   ALBUTEROL INHL Inhale into the lungs.    amLODIPine (NORVASC) 10 MG tablet Take 10 mg by mouth daily    beclomethasone (QVAR) 80  mcg/actuation inhaler Inhale 1 puff into the lungs 2 times a day.    blood sugar diagnostic Strp Test daily    blood-glucose meter Misc by Does not apply route.    budesonide-formoterol (SYMBICORT) 160-4.5 mcg/actuation inhaler Inhale 2 puffs into the lungs 2 times daily.    buPROPion SR (WELLBUTRIN SR) 100 MG tablet Take 100 mg by mouth 2 times daily Ran out of prescription    cloNIDine (CATAPRES) 0.2 mg/24 hr patch Place 1 patch onto the skin once a week.    cyclobenzaprine (FLEXERIL) 10 MG tablet Take 1 tablet by mouth 3 times daily as needed.    FLUoxetine (PROZAC) 20 MG capsule  Take 20 mg by mouth daily Ran out of prescription    hydrochlorothiazide (HYDRODIURIL) 25 MG tablet Take 25 mg by mouth daily.    loratadine 10 mg Cap Take 1 tablet by mouth daily    losartan (COZAAR) 100 MG tablet Take 100 mg by mouth daily    metFORMIN (GLUCOPHAGE) 500 MG tablet Take 500 mg by mouth 2 times a day with meals.    nabumetone (RELAFEN) 500 MG tablet Take 1 tablet by mouth 2 times daily.    naproxen (NAPROSYN) 500 MG tablet Take 1 tablet by mouth 2 times daily (with meals).    omeprazole (PRILOSEC) 20 MG capsule Take 1 capsule (20 mg total) by mouth 2 times a day before meals for 30 days. Take 1 capsule by mouth Daily.    oxyCODONE-acetaminophen (PERCOCET) 10-325 mg per tablet     tamsulosin (FLOMAX) 0.4 mg Cp24 Take 0.4 mg by mouth daily        Inpatient Meds:  Scheduled:    Continuous:   ??? sodium chloride         PRN:     Vital Signs     Wt Readings from Last 3 Encounters:   11/08/13 186 lb 9.6 oz (84.641 kg)   04/22/13 180 lb (81.647 kg)   12/26/10 180 lb (81.647 kg)     Ht Readings from Last 3 Encounters:   11/08/13 5' 11 (1.803 m)   04/22/13 5' 11 (1.803 m)   12/26/10 5' 11 (1.803 m)     Temp Readings from Last 3 Encounters:   04/22/13 98 ??F (36.7 ??C) Oral   12/26/10 97.9 ??F (36.6 ??C) Oral     BP Readings from Last 3 Encounters:   11/08/13 158/90   04/22/13 138/90   12/26/10 145/83     Pulse Readings from Last 3  Encounters:   11/08/13 92   04/22/13 68   12/26/10 90     SpO2 Readings from Last 3 Encounters:   04/22/13 96%   12/26/10 97%       Physical Exam     Airway:     Mallampati: II  Mouth Opening: >2 FB  TM distance: > = 3 FB  Neck ROM: full    Dental:        Comment: Several missing teeth      Pulmonary:        Cardiovascular:       Neuro/Musculoskeletal/Psych:    Mental status: alert and oriented to person, place and time.          Abdominal:       Current OB Status:       Other Findings:        Laboratory Data     Lab Results   Component Value Date    WBC 6.0 11/08/2013    HGB 15.6 11/08/2013    HCT 45.6 11/08/2013    MCV 96.5 11/08/2013    PLT 151 11/08/2013       No results found for this basename: ABORH       Lab Results   Component Value Date    GLUCOSE 59* 11/08/2013    BUN 9 11/08/2013    CO2 28 11/08/2013    CREATININE 1.05 11/08/2013    K 4.0 11/08/2013    NA 138 11/08/2013    CL 103 11/08/2013    CALCIUM 9.4 11/08/2013    ALBUMIN 4.1 11/08/2013    PROT 7.7 11/08/2013    ALKPHOS 131* 11/08/2013  ALT 76* 11/08/2013    BILITOT 0.9 11/08/2013       No results found for this basename: PT, PTT, INR       No results found for this basename: PREGTESTUR, PREGSERUM, HCG, HCGQUANT       Anesthesia Plan     ASA 2     Anesthesia Type:  MAC.     Intravenous induction.    Anesthetic plan and risks discussed with patient.    Plan, alternatives, and risks of anesthesia, including death, have been explained to and discussed with the patient/legal guardian.  By my assessment, the patient/legal guardian understands and agrees.  Scenario presented in detail.  Questions answered.      Plan discussed with CRNA.

## 2013-11-19 NOTE — Unmapped (Signed)
Discharge instructions given to pt and family. Verbalized understanding. Pt is drinking juice.

## 2013-11-19 NOTE — Unmapped (Signed)
H&P reviewed, patient examined, no changes to H&P.

## 2013-11-19 NOTE — Unmapped (Signed)
Informed patient of the lab results, noted that he is Hep B surface antigen positive and smooth muscle antibody is weakly positive, ordered more workup for his HBV and an ultrasound abdomen   Patient was seen in endoscopy today

## 2013-11-19 NOTE — Unmapped (Signed)
Anesthesia Transfer of Care Note    Patient: [name]  Procedure(s) Performed: Procedure(s):  ESOPHAGOGASTRODUODENOSCOPY WITH MAC    Patient location: Same Day Surgery    Post pain: Adequate analgesia    Post assessment: no apparent anesthetic complications and tolerated procedure well    Post vital signs:    Filed Vitals:    11/19/13 1502   BP: 134/83   Pulse: 68   Temp: 98.1 ??F    Resp: 14   SpO2: 100%       Level of consciousness: awake and responds to stimulation    Complications: None

## 2013-11-24 NOTE — Unmapped (Signed)
Last OV successfully faxed for continued care.

## 2013-12-06 ENCOUNTER — Ambulatory Visit: Admit: 2013-12-06 | Payer: MEDICAID

## 2013-12-06 ENCOUNTER — Ambulatory Visit: Admit: 2013-12-06 | Payer: MEDICAID | Attending: Gastroenterology

## 2013-12-06 DIAGNOSIS — B181 Chronic viral hepatitis B without delta-agent: Secondary | ICD-10-CM

## 2013-12-06 LAB — PROTIME-INR
INR: 1 (ref 0.9–1.1)
Protime: 13.1 s (ref 11.6–14.4)

## 2013-12-06 NOTE — Unmapped (Signed)
Addended by: Isaiah Serge on: 12/06/2013 11:44 AM     Modules accepted: Orders

## 2013-12-06 NOTE — Unmapped (Signed)
F/u 6 weeks

## 2013-12-06 NOTE — Unmapped (Signed)
Chief Complaint   Patient presents with   ??? Abnormal lvier enzymes      2W EGD FU     Danny Miles Doriott, DO  [address]  [place], [place] [address]       History of Present Illness:    Had EGD done  Still 50% controlled   No new problems  Anxious about his HCV      Visit 11/08/13    Acid reflux for total of 10 years  used Prilosec once daily   Takes acid medication before meals   Coffee - 1 cup per day, chocolate - every now and day, mint gums  Alcohol -  none  Soda - 2-3 cans, regular pepsi  Smoking - none   Sleeps with 2 pillows at night to prop up  Sometime sits up at night time due to bad acid reflux   Has bad acid reflux at night    Acid controlled 50% on the pill     Occasional dysphagia to solids   No black stools  No weight loss    Never had upper endoscopy     Saw that he had abnormal liver enzymes with Dr.Doriott and sent to GI   No drug use now, no liver problems that he knows of   Used cocaine until 2 years ago - smoking it, denies snorting it anytime  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    Patient Active Problem List    Diagnosis   ??? Hiatal hernia   ??? Gastroesophageal reflux disease without esophagitis   ??? Abnormal liver enzymes   ??? Personal history of other specified diseases         Medications:  Current outpatient prescriptions: ALBUTEROL INHL, Inhale into the lungs., Disp: , Rfl: ;  amLODIPine (NORVASC) 10 MG tablet, Take 10 mg by mouth daily, Disp: , Rfl: ;  beclomethasone (QVAR) 80 mcg/actuation inhaler, Inhale 1 puff into the lungs 2 times a day., Disp: , Rfl: ;  blood sugar diagnostic Strp, Test daily, Disp: , Rfl: ;  blood-glucose meter Misc, by Does not apply route., Disp: , Rfl:   budesonide-formoterol (SYMBICORT)  160-4.5 mcg/actuation inhaler, Inhale 2 puffs into the lungs 2 times daily., Disp: , Rfl: ;  buPROPion SR (WELLBUTRIN SR) 100 MG tablet, Take 100 mg by mouth 2 times daily Ran out of prescription, Disp: , Rfl: ;  cloNIDine (CATAPRES) 0.2 mg/24 hr patch, Place 1 patch onto the skin once a week., Disp: , Rfl: ;  cyclobenzaprine (FLEXERIL) 10 MG tablet, Take 1 tablet by mouth 3 times daily as needed., Disp: , Rfl:   FLUoxetine (PROZAC) 20 MG capsule, Take 20 mg by mouth daily Ran out of prescription, Disp: , Rfl: ;  hydrochlorothiazide (HYDRODIURIL) 25 MG tablet, Take 25 mg by mouth daily., Disp: , Rfl: ;  loratadine 10 mg Cap, Take 1 tablet by mouth daily, Disp: , Rfl: ;  losartan (COZAAR) 100 MG tablet, Take 100 mg by mouth daily, Disp: , Rfl: ;  metFORMIN (GLUCOPHAGE) 500 MG tablet, Take 500 mg by mouth 2 times a day with meals., Disp: , Rfl:   omeprazole (PRILOSEC) 20 MG  capsule, Take 1 capsule (20 mg total) by mouth 2 times a day before meals for 30 days. Take 1 capsule by mouth Daily., Disp: 60 capsule, Rfl: 3;  oxyCODONE-acetaminophen (PERCOCET) 10-325 mg per tablet, , Disp: , Rfl: ;  tamsulosin (FLOMAX) 0.4 mg Cp24, Take 0.4 mg by mouth daily, Disp: , Rfl:     Allergies:  Penicillins    Family History:  His family history is not on file.    Past Surgical History:  He has past surgical history that includes Hernia repair; Foot surgery (Left); and Esophagogastroduodenoscopy (N/A, 11/19/2013).    Social History:  He reports that he has been smoking Cigarettes.  He has been smoking about 0.25 packs per day. He does not have any smokeless tobacco history on file. He reports that he uses illicit drugs (Marijuana) about twice per week. He reports that he does not drink alcohol.      Review of Systems:  * See scanned Review of Systems sheet.    Vital Signs:  Blood pressure 132/88, pulse 72, resp. rate 12, height 5' 11 (1.803 m), weight 184 lb 12.8 oz (83.825 kg).  Physical Exam   Constitutional: He appears  well-developed and well-nourished.   HENT:   Head: Normocephalic and atraumatic.   Eyes: Pupils are equal, round, and reactive to light. Right eye exhibits no discharge. Left eye exhibits no discharge. No scleral icterus.   Neck: No JVD present. No tracheal deviation present.   Cardiovascular: Normal rate and regular rhythm.    Pulmonary/Chest: Effort normal and breath sounds normal.   Abdominal: Soft. Bowel sounds are normal. There is no tenderness. There is no rebound.   Musculoskeletal: He exhibits no edema or tenderness.   Neurological: No cranial nerve deficit. Coordination normal.   Skin: Skin is warm and dry. No rash noted. No erythema.   Psychiatric: He has a normal mood and affect. His behavior is normal.       Review of Lab Results:  Lab Results   Component Value Date    WBC 6.0 11/08/2013    HGB 15.6 11/08/2013    HCT 45.6 11/08/2013    MCV 96.5 11/08/2013    PLT 151 11/08/2013    CREATININE 1.05 11/08/2013    BUN 9 11/08/2013    NA 138 11/08/2013    K 4.0 11/08/2013    CL 103 11/08/2013    CO2 28 11/08/2013    ALT 76* 11/08/2013    ALKPHOS 131* 11/08/2013    BILITOT 0.9 11/08/2013            Assessment & Plan:    Chronic Hepatitis B e Ag positive   HBV Genotype A, basal core promoter mutation detected   GERD  Abnormal liver enzymes   Hiatal hernia 2 cm   H/o Cocaine use 2 years ago  Remote Marijuana use   Last colonoscopy 5 years ago per patient report, normal, repeat in 5 years from now   EGD 10/15 as above       Chronic liver work up showed mild elevated anti-smooth muscle antibody  HCV  - negative  HAV - not immune   Will need HAV vaccination   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    Ig G level  - normal, would not consider autoimmune hepatitis for now     Some studies suggest aggressive disease/HCC development with core promoter mutations but not entirely clear based on literature  Explained modes of transmission of Hepatitis  B  Counseled on the following:   Avoiding sharing drug needles/cocaine straws, using infected needles for tattooing, acupuncture, or piercings, sharing toothbrushes, razors, or other things that could have blood on them  Informed that obesity, cigarette smoking, and marijuana smoking can promote hepatic fibrosis and to quit those habits and start working on losing weight  Risk of sexual transmission explained, need not start using barrier precautions   Advised to not donate blood, body organs, other tissues, or semen  Gave patient info for HBV      Drug screen negative     EGD with hiatal hernia   Change PPI 2 times daily   Stop chocolate, mint and soda  Counseled on avoiding coffee,chocolate, alcohol,fatty foods, mint containing products which can make GERD worse  Take PPI on empty stomach, about 30 mins prior to meals   Avoid soda and any other carbonated beverages   Raise the head of the bed six to eight inches prior to sleeping  Avoid large and late meals, eat dinner atleast 3 hours prior to bed time   Avoid tight fitting clothing   GERD diet information given to patient    U/S abdomen - pending   AFP and HDV antibody ordered     Since ALT levels are between 1-2  ULN, will recheck ALT q1-3 months;     liver biopsy ordered ( age> 40)  Will consider treatment if biopsy shows moderate/severe inflammation or  significant fibrosis    F/u 6 weeks

## 2013-12-13 ENCOUNTER — Ambulatory Visit
Admit: 2013-12-13 | Discharge: 2013-12-13 | Payer: MEDICAID | Attending: Critical Care Medicine | Primary: Family Medicine

## 2013-12-13 DIAGNOSIS — J449 Chronic obstructive pulmonary disease, unspecified: Secondary | ICD-10-CM

## 2013-12-13 MED ORDER — ALBUTEROL SULFATE HFA 108 (90 BASE) MCG/ACT IN AERS
108 (90 Base) MCG/ACT | Freq: Four times a day (QID) | RESPIRATORY_TRACT | Status: AC | PRN
Start: 2013-12-13 — End: ?

## 2013-12-13 MED ORDER — BUDESONIDE-FORMOTEROL FUMARATE 160-4.5 MCG/ACT IN AERO
Freq: Two times a day (BID) | RESPIRATORY_TRACT | Status: AC
Start: 2013-12-13 — End: ?

## 2013-12-13 NOTE — Progress Notes (Signed)
[name] Peck is 58 y.o. male here for F/U visit. He was seen for Pulmonary Medicine consultation referred by Dr. Holland Falling for evaluation of S.O.B  Patient has hx of COPD. He is a smoker for the last 45 years and continues to smoke  His last PFT showed mild obstructive changes  He does have hx of GERD  He denies any weight loss, hemoptysis or diaphoresis  He has been on Symbicort and felt better with that    His CT chest was read as:    CT OF THE CHEST WITHOUT CONTRAST   Aug 20, 2013 12:48:26 PM      TECHNIQUE:   CT of the chest was performed without the administration of   intravenous   contrast. Multiplanar reformatted images are provided for review.      COMPARISON:   None      HISTORY:   SOB (shortness of breath)      FINDINGS:   Mediastinum: No adenopathy. The heart and pericardium are   unremarkable.      Lungs/Pleura: Calcified granuloma is noted posteriorly in the   right upper lobe. Mild findings of centrilobular emphysema present   bilaterally.      Upper Abdomen: Scattered, punctate hypodensities in the liver   noted measuring up to 5 mm.      Soft Tissues/Bones: No acute osseous injury. No soft tissue   abnormalities.            Impression   IMPRESSION:       No acute cardio pulmonary disease.      A 0 granulomas disease and mild centrilobular emphysema.      Hypodensities in the liver are too small to characterize   completely, most likely benign in the since of history of   malignancy.     Bronchoscopy was engative  He was seen by GI and was told he has Hep B    Past Medical History   Diagnosis Date   ??? Chronic back pain    ??? Hypertension    ??? COPD (chronic obstructive pulmonary disease) ([place])    ??? Cervical pain (neck)    ??? Sleep apnea    ??? Degenerative cervical disc 03/12/2010   ??? Hyperlipidemia 03/21/2010   ??? GERD (gastroesophageal reflux disease) 03/29/2010   ??? Obstructive sleep apnea 05/16/2010   ??? Anxiety and depression      Current Outpatient Prescriptions   Medication Sig Dispense Refill   ??? SYMBICORT  160-4.5 MCG/ACT AERO INHALE 2 PUFFS INTO THE LUNGS TWICE A DAY 1 Inhaler 0   ??? losartan (COZAAR) 100 MG tablet Take 100 mg by mouth daily     ??? amLODIPine (NORVASC) 10 MG tablet Take 10 mg by mouth daily     ??? FLUoxetine (PROZAC) 20 MG capsule Take 20 mg by mouth daily Ran out of prescription     ??? buPROPion (WELLBUTRIN SR) 100 MG SR tablet Take 100 mg by mouth 2 times daily Ran out of prescription     ??? tamsulosin (FLOMAX) 0.4 MG capsule Take 0.4 mg by mouth daily     ??? Loratadine 10 MG CAPS Take 1 tablet by mouth daily     ??? omeprazole (PRILOSEC) 20 MG capsule Take 1 capsule by mouth Daily. 30 capsule 6   ??? albuterol (PROVENTIL HFA;VENTOLIN HFA) 108 (90 BASE) MCG/ACT inhaler Inhale 2 puffs into the lungs every 6 hours as needed. 1 Inhaler 2   ??? naproxen (NAPROSYN) 500 MG tablet Take 1  tablet by mouth 2 times daily (with meals). 60 tablet 3   ??? cyclobenzaprine (FLEXERIL) 10 MG tablet Take 1 tablet by mouth 3 times daily as needed. 90 tablet 3   ??? metformin (GLUCOPHAGE-XR) 500 MG XR tablet Take 1 tablet by mouth 2 times daily. 60 tablet 3   ??? Blood Glucose Monitoring Suppl (BLOOD GLUCOSE MONITOR KIT) KIT by Does not apply route. 1 kit 0   ??? Glucose Blood (BLOOD GLUCOSE TEST STRIPS) STRP Test daily 60 strip 11   ??? nabumetone (RELAFEN) 500 MG tablet Take 1 tablet by mouth 2 times daily. 60 tablet 3     No current facility-administered medications for this visit.       Family History   Problem Relation Age of Onset   ??? High Blood Pressure Mother      History     Social History   ??? Marital Status: Legally Separated     Spouse Name: N/A     Number of Children: N/A   ??? Years of Education: N/A     Occupational History   ??? Not on file.     Social History Main Topics   ??? Smoking status: Current Every Day Smoker -- 0.50 packs/day for 40 years     Types: Cigarettes   ??? Smokeless tobacco: Not on file      Comment: Cutting down, now smoking 10 cigarettes a day   ??? Alcohol Use: No   ??? Drug Use: No   ??? Sexual Activity: Not on file      Other Topics Concern   ??? Not on file     Social History Narrative         Review of Systems   Constitutional: Negative.  Negative for fever, chills, diaphoresis, activity change, appetite change, fatigue and unexpected weight change.   HENT: Negative.  Negative for hearing loss, ear pain, nosebleeds, congestion, facial swelling, rhinorrhea, sneezing, neck pain, neck stiffness, postnasal drip, sinus pressure and ear discharge.    Eyes: Negative.  Negative for photophobia, pain, discharge, redness, itching and visual disturbance.   Respiratory: As per HPI  Cardiovascular: Negative.  Negative for chest pain, palpitations and leg swelling.   Gastrointestinal: Negative.  Negative for abdominal pain, blood in stool, abdominal distention and anal bleeding.   Genitourinary: Negative.  Negative for dysuria, urgency, frequency, hematuria, decreased urine volume, enuresis and difficulty urinating.   Musculoskeletal: Negative.  Negative for myalgias, back pain, joint swelling, arthralgias and gait problem.   Skin: Negative.  Negative for color change and pallor.   Neurological: Negative.  Negative for dizziness, tremors, seizures, syncope, facial asymmetry, speech difficulty, weakness, light-headedness, numbness and headaches.   Hematological: Negative.  Negative for adenopathy.   Psychiatric/Behavioral: Negative.  Negative for hallucinations, behavioral problems, confusion and agitation. The patient is not nervous/anxious and is not hyperactive.      Blood pressure 139/93, pulse 84, resp. rate 18, height 6' (1.829 m), weight 180 lb (81.647 kg), SpO2 99 %.    Physical Exam   Constitutional: Oriented. Well-developed and well-nourished. No distress.   HENT:   Head: Normocephalic and atraumatic.   Mouth/Throat: Oropharynx is clear and moist. No oropharyngeal exudate.   Eyes: Conjunctivae and extraocular motions are normal. Pupils are equal, round, and reactive to light. Right eye exhibits no discharge. Left eye exhibits no  discharge.   Neck: Normal range of motion. Neck supple. No JVD present. Carotid bruit is not present. No rigidity. No tracheal deviation present. No thyromegaly  present.   Cardiovascular: Normal rate, regular rhythm, S1&S2 and intact distal pulses.    Pulmonary/Chest: Effort normal and breath sounds normal. No stridor. No respiratory distress. Has no wheezes. Has no rhonchi. Has no rales. Exhibits no tenderness.   Abdominal: Soft. Bowel sounds are normal. Exhibits no shifting dullness, no distension and no mass. No tenderness. Has no rebound and no guarding.   Musculoskeletal: Normal range of motion. Exhibits no edema and no tenderness.   Lymphadenopathy:     Has no cervical adenopathy.     Has no axillary adenopathy.   Neurological: Alert and oriented. Has normal reflexes. No cranial nerve deficit. Exhibits normal muscle tone. Coordination normal.   Skin: Skin is warm and dry. No rash noted. No erythema.   Psychiatric: Has a normal mood and affect. Behavior is normal. Thought content normal.      Assessment:    1. Chronic obstructive pulmonary disease, unspecified COPD type ([place])    2. Tobacco abuse              Plan:    1. He has been doing well  2. Continue Symbicort 160 BID and Albuterol PRN  3. I encouraged him to quit smoking  4. He is refusing vaccination  5. RTC in 1 year

## 2013-12-13 NOTE — Communication Body (Signed)
Assessment:  Assessment:    1. Chronic obstructive pulmonary disease, unspecified COPD type ([place])    2. Tobacco abuse            Plan:  Plan:    1. He has been doing well  2. Continue Symbicort 160 BID and Albuterol PRN  3. I encouraged him to quit smoking  4. He is refusing vaccination  5. RTC in 1 year

## 2013-12-20 NOTE — Unmapped (Signed)
Liver Needle Biopsy    Post-procedure Care    ?? Bed rest for 24 hours  ?? Leave bandage for 24 hours  ?? You may shower after 24 hours  ?? Avoid strenuous activity, such as heavy lifting for one week.  Try to not cough hard or strain for several hours after the procedure.  ?? Resume normal diet  ?? The biopsy site may be tender or sore for several days after the biopsy.  Take pain reliever for soreness.  Do not take Aspirin containing products or NSAIDS these may increase your chances of bleeding.      IF YOU HAVE RECEIVED SEDATION:  Do not drive or operate machinery for the first twenty four hours.  Do not make any important decisions for twenty four hours. Do not sign any legal documents for twenty four hours.      Notify your doctor to report any of the following:  ?? Fever over 101 degrees Fahrenheit and/or chills  ?? Redness, swelling, warmth, or bleeding or other drainage from the biopsy site.  ?? Increased pain around the biopsy site or elsewhere not controlled with pain medication  ?? Shortness of breath and/or difficulty breathing      Please feel free to call with any concerns:  Radiology Nurse [phone]  Radiology Department [phone]

## 2013-12-22 ENCOUNTER — Inpatient Hospital Stay: Payer: MEDICAID

## 2014-01-31 ENCOUNTER — Encounter: Payer: MEDICAID | Attending: Gastroenterology

## 2014-02-15 NOTE — Unmapped (Signed)
Will notify provider.

## 2014-02-15 NOTE — Unmapped (Signed)
Called patient will arrange follow up in office in 2-3 weeks  Will do Hepatitis A vaccine in the office in follow up   Had first HAV vaccine 12/06/13

## 2014-02-15 NOTE — Unmapped (Signed)
Patient want to know if Dr. Asa Lente can ok for him to get injections from his pcp.  Patient has an appt today at 1:30 and would like to have this done.  Please call pt asap to advise, [phone].

## 2014-03-03 ENCOUNTER — Other Ambulatory Visit: Admit: 2014-03-03 | Payer: MEDICAID

## 2014-03-03 ENCOUNTER — Ambulatory Visit: Admit: 2014-03-03 | Payer: MEDICAID | Attending: Gastroenterology

## 2014-03-03 DIAGNOSIS — B180 Chronic viral hepatitis B with delta-agent: Secondary | ICD-10-CM

## 2014-03-03 DIAGNOSIS — B181 Chronic viral hepatitis B without delta-agent: Secondary | ICD-10-CM

## 2014-03-03 LAB — COMPREHENSIVE METABOLIC PANEL, SERUM
ALT: 81 U/L — ABNORMAL HIGH (ref 7–52)
AST (SGOT): 58 U/L — ABNORMAL HIGH (ref 13–39)
Albumin: 4.1 g/dL (ref 3.5–5.7)
Alkaline Phosphatase: 117 U/L (ref 36–125)
Anion Gap: 6 mmol/L (ref 3–16)
BUN: 11 mg/dL (ref 7–25)
CO2: 30 mmol/L (ref 21–33)
Calcium: 8.9 mg/dL (ref 8.6–10.3)
Chloride: 101 mmol/L (ref 98–110)
Creatinine: 1.11 mg/dL (ref 0.60–1.30)
GFR MDRD Af Amer: 82 See note.
GFR MDRD Non Af Amer: 68 See note.
Glucose: 75 mg/dL (ref 70–100)
Osmolality, Calculated: 282 mosm/kg (ref 278–305)
Potassium: 4.1 mmol/L (ref 3.5–5.3)
Sodium: 137 mmol/L (ref 133–146)
Total Bilirubin: 1.6 mg/dL — ABNORMAL HIGH (ref 0.0–1.5)
Total Protein: 7.2 g/dL (ref 6.4–8.9)

## 2014-03-03 LAB — CBC
Hematocrit: 46.9 % (ref 38.5–50.0)
Hemoglobin: 15.5 g/dL (ref 13.2–17.1)
MCH: 31.4 pg (ref 27.0–33.0)
MCHC: 33.2 g/dL (ref 32.0–36.0)
MCV: 94.8 fL (ref 80.0–100.0)
MPV: 10.8 fL (ref 7.5–11.5)
Platelets: 140 10E3/uL (ref 140–400)
RBC: 4.95 10E6/uL (ref 4.20–5.80)
RDW: 14.7 % (ref 11.0–15.0)
WBC: 5.3 10E3/uL (ref 3.8–10.8)

## 2014-03-03 LAB — HEPATITIS DELTA ANTIBODY: Hep Delta Ab: NEGATIVE

## 2014-03-03 LAB — AFP TUMOR MARKER: AFP-Tumor Marker: 4.9 ng/mL (ref 0.0–9.0)

## 2014-03-03 NOTE — Unmapped (Addendum)
Follow up 3 months   Ultrasound abdomen and liver biopsy

## 2014-03-03 NOTE — Unmapped (Signed)
Chief Complaint   Patient presents with   ??? Chronic viral hep B w/o delta agent & w/o coma     2W FU 2nd Hep A injection   ??? Abnormal Liver Tests     Austin Miles Doriott, DO  [address]  [place], [place] [address]       History of Present Illness:    Has not gotten Liver biopsy  Acid reflux still controlled 50%  Taking Prilosec once daily   Still drinking soda    Visit 12/06/13    Had EGD done  Still 50% controlled   No new problems  Anxious about his HCV      Visit 11/08/13    Acid reflux for total of 10 years  used Prilosec once daily   Takes acid medication before meals   Coffee - 1 cup per day, chocolate - every now and day, mint gums  Alcohol -  none  Soda - 2-3 cans, regular pepsi  Smoking - none   Sleeps with 2 pillows at night to prop up  Sometime sits up at night time due to bad acid reflux   Has bad acid reflux at night    Acid controlled 50% on the pill     Occasional dysphagia to solids   No black stools  No weight loss    Never had upper endoscopy     Saw that he had abnormal liver enzymes with Dr.Doriott and sent to GI   No drug use now, no liver problems that he knows of   Used cocaine until 2 years ago - smoking it, denies snorting it anytime  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    Patient Active Problem List    Diagnosis   ??? Chronic viral hepatitis B without delta agent and without coma   ??? Hiatal hernia   ??? Gastroesophageal reflux disease without esophagitis   ??? Abnormal liver enzymes   ??? Personal history of other specified diseases         Medications:    Current outpatient prescriptions:   ???  ALBUTEROL INHL, Inhale into the lungs., Disp: , Rfl:   ???  amLODIPine (NORVASC) 10 MG tablet, Take 10 mg by mouth daily, Disp: , Rfl:   ???   beclomethasone (QVAR) 80 mcg/actuation inhaler, Inhale 1 puff into the lungs 2 times a day., Disp: , Rfl:   ???  blood sugar diagnostic Strp, Test daily, Disp: , Rfl:   ???  blood-glucose meter Misc, by Does not apply route., Disp: , Rfl:   ???  budesonide-formoterol (SYMBICORT) 160-4.5 mcg/actuation inhaler, Inhale 2 puffs into the lungs 2 times daily., Disp: , Rfl:   ???  buPROPion SR (WELLBUTRIN SR) 100 MG tablet, Take 100 mg by mouth 2 times daily Ran out of prescription, Disp: , Rfl:   ???  cloNIDine (CATAPRES) 0.2 mg/24 hr patch, Place 1 patch onto the skin once a week., Disp: , Rfl:   ???  cyclobenzaprine (FLEXERIL) 10 MG tablet, Take 1 tablet by mouth 3 times daily as needed., Disp: , Rfl:   ???  FLUoxetine (PROZAC) 20 MG capsule, Take 20 mg by mouth daily  Ran out of prescription, Disp: , Rfl:   ???  loratadine 10 mg Cap, Take 1 tablet by mouth daily, Disp: , Rfl:   ???  losartan (COZAAR) 100 MG tablet, Take 100 mg by mouth daily, Disp: , Rfl:   ???  metFORMIN (GLUCOPHAGE) 500 MG tablet, Take 500 mg by mouth 2 times a day with meals., Disp: , Rfl:   ???  oxyCODONE-acetaminophen (PERCOCET) 10-325 mg per tablet, , Disp: , Rfl:   ???  tamsulosin (FLOMAX) 0.4 mg Cp24, Take 0.4 mg by mouth daily, Disp: , Rfl:     Allergies:  Penicillins    Family History:  His family history is not on file.    Past Surgical History:  He has past surgical history that includes Hernia repair; Foot surgery (Left); and Esophagogastroduodenoscopy (N/A, 11/19/2013).    Social History:  He reports that he has been smoking Cigarettes.  He has been smoking about 0.25 packs per day. He does not have any smokeless tobacco history on file. He reports that he uses illicit drugs (Marijuana) about twice per week. He reports that he does not drink alcohol.      Review of Systems:  * See scanned Review of Systems sheet.    Vital Signs:  Blood pressure 154/84, pulse 84, resp. rate 14, height 5' 9 (1.753 m), weight 181 lb 3.2 oz (82.192 kg).  Physical Exam    Constitutional: He appears well-developed and well-nourished.   HENT:   Head: Normocephalic and atraumatic.   Eyes: Pupils are equal, round, and reactive to light. Right eye exhibits no discharge. Left eye exhibits no discharge. No scleral icterus.   Neck: No JVD present. No tracheal deviation present.   Cardiovascular: Normal rate and regular rhythm.    Pulmonary/Chest: Effort normal and breath sounds normal.   Abdominal: Soft. Bowel sounds are normal. There is no tenderness. There is no rebound.   Musculoskeletal: He exhibits no edema or tenderness.   Neurological: No cranial nerve deficit. Coordination normal.   Skin: Skin is warm and dry. No rash noted. No erythema.   Psychiatric: He has a normal mood and affect. His behavior is normal.       Review of Lab Results:  Lab Results   Component Value Date    WBC 6.0 11/08/2013    HGB 15.6 11/08/2013    HCT 45.6 11/08/2013    MCV 96.5 11/08/2013    PLT 151 11/08/2013    CREATININE 1.05 11/08/2013    BUN 9 11/08/2013    NA 138 11/08/2013    K 4.0 11/08/2013    CL 103 11/08/2013    CO2 28 11/08/2013    ALT 76* 11/08/2013    ALKPHOS 131* 11/08/2013    BILITOT 0.9 11/08/2013                 Assessment & Plan:    Chronic Hepatitis B e Ag positive   HBV Genotype A, basal core promoter mutation detected   GERD  Abnormal liver enzymes   Hiatal hernia 2 cm   H/o Cocaine use 2 years ago  Remote Marijuana use   Last colonoscopy 5 years ago per patient report, normal, repeat in 5 years from now   EGD 10/15 as above     Has not had liver biopsy yet   Chronic liver work up showed mild elevated anti-smooth muscle antibody, do not think it is of clinical significance  HCV  - negative  HAV - not immune  Got 2nd dose today  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    Ig G level  - normal, would not consider autoimmune hepatitis for now     Some studies suggest aggressive disease/HCC development with core promoter mutations  but not entirely clear based on literature     Drug screen negative     EGD with hiatal hernia   Change PPI 2 times daily   Stop chocolate, mint and soda  Counseled on avoiding coffee,chocolate, alcohol,fatty foods, mint containing products which can make GERD worse  Take PPI on empty stomach, about 30 mins prior to meals   Avoid soda and any other carbonated beverages   Raise the head of the bed six to eight inches prior to sleeping  Avoid large and late meals, eat dinner atleast 3 hours prior to bed time   Avoid tight fitting clothing   GERD diet information given to patient    U/S abdomen - pending, not done   AFP and HDV antibody ordered   Since ALT levels are between 1-2  ULN, will recheck ALT now and every 3 months;   liver biopsy ordered ( age> 40), has not had it done yet   Will consider treatment if biopsy shows moderate/severe inflammation or  significant fibrosis    Follow up in 3 months

## 2014-03-15 NOTE — Unmapped (Signed)
Spoke with Dewayne Hatch, explained this is just scheduled through radiology via the schedulers.  She was under the impression that a doctor would call and schedule this.  Explained that it's done right there in radiology it isn't a surgical procedure.

## 2014-03-15 NOTE — Unmapped (Signed)
[name] in central scheduling states patient would like to schedule liver biopsy. She would like someone from office to call her to schedule.

## 2014-03-18 NOTE — Unmapped (Signed)
Liver Needle Biopsy    Post-procedure Care    ?? Bed rest for 24 hours  ?? Leave bandage on for 24 hours  ?? You may shower after 24 hours  ?? Avoid strenuous activity, such as heavy lifting for one week.  Try to not cough hard or strain for several hours after the procedure.  ?? Resume normal diet  ?? The biopsy site may be tender or sore for several days after the biopsy.  Take pain reliever for soreness.  Do not take Aspirin containing products or NSAIDS these may increase your chances of bleeding.      IF YOU HAVE RECEIVED SEDATION:  Do not drive or operate machinery for the first twenty four hours.  Do not make any important decisions for twenty four hours. Do not sign any legal documents for twenty four hours.    Notify your doctor to report any of the following:  ?? Fever over 101.5 degrees Fahrenheit and/or chills  ?? Redness, swelling, warmth, or bleeding or other drainage from the biopsy site.  ?? Increased pain around the biopsy site or elsewhere not controlled with pain medication  ?? Extreme nausea or vomiting  ?? Shortness of breath and/or difficulty breathing    Please feel free to call with any concerns:  Radiology Nurse [phone]  Radiology Department [phone]

## 2014-03-21 ENCOUNTER — Inpatient Hospital Stay: Admit: 2014-03-21 | Payer: MEDICAID

## 2014-03-21 ENCOUNTER — Inpatient Hospital Stay: Admit: 2014-03-21 | Payer: MEDICAID | Attending: Gastroenterology

## 2014-03-21 DIAGNOSIS — B181 Chronic viral hepatitis B without delta-agent: Secondary | ICD-10-CM

## 2014-03-21 DIAGNOSIS — R740 Nonspecific elevation of levels of transaminase and lactic acid dehydrogenase [LDH]: Secondary | ICD-10-CM

## 2014-03-21 LAB — POC GLU MONITORING DEVICE: POC Glucose Monitoring Device: 95 mg/dL (ref 70–100)

## 2014-03-21 LAB — PROTIME-INR
INR: 1.1 (ref 0.9–1.1)
Protime: 13.4 s (ref 11.6–14.4)

## 2014-03-21 MED ORDER — midazolam (PF) (VERSED) 1 mg/mL injection
1 | INTRAMUSCULAR | Status: AC
Start: 2014-03-21 — End: 2014-03-21

## 2014-03-21 MED ORDER — midazolam (VERSED) injection
1 | INTRAMUSCULAR | Status: AC | PRN
Start: 2014-03-21 — End: 2014-03-21
  Administered 2014-03-21: 16:00:00 1 via INTRAVENOUS

## 2014-03-21 MED ORDER — sodium chloride 0.9 % infusion
INTRAVENOUS | Status: AC | PRN
Start: 2014-03-21 — End: 2014-03-21
  Administered 2014-03-21: 15:00:00 50 via INTRAVENOUS

## 2014-03-21 MED ORDER — fentaNYL (SUBLIMAZE) 50 mcg/mL injection
50 | INTRAMUSCULAR | Status: AC
Start: 2014-03-21 — End: 2014-03-21

## 2014-03-21 MED ORDER — lidocaine 10 mg/mL (1 %) injection
10 | INTRAMUSCULAR | Status: AC
Start: 2014-03-21 — End: 2014-03-21
  Administered 2014-03-21: 16:00:00

## 2014-03-21 MED ORDER — fentaNYL (SUBLIMAZE) injection
50 | INTRAMUSCULAR | Status: AC | PRN
Start: 2014-03-21 — End: 2014-03-21
  Administered 2014-03-21: 16:00:00 50 via INTRAVENOUS

## 2014-03-21 MED ORDER — sodium chloride 0.9 % infusion
INTRAVENOUS | Status: AC
Start: 2014-03-21 — End: 2014-03-21

## 2014-03-21 MED ORDER — losartan (COZAAR) tablet 100 mg
100 | Freq: Every day | ORAL | Status: AC
Start: 2014-03-21 — End: 2014-03-21
  Administered 2014-03-21: 18:00:00 100 mg via ORAL

## 2014-03-21 MED ORDER — amLODIPine (NORVASC) tablet 10 mg
10 | Freq: Every day | ORAL | Status: AC
Start: 2014-03-21 — End: 2014-03-21
  Administered 2014-03-21: 18:00:00 10 mg via ORAL

## 2014-03-21 MED FILL — SODIUM CHLORIDE 0.9 % INTRAVENOUS SOLUTION: INTRAVENOUS | Qty: 500

## 2014-03-21 MED FILL — LOSARTAN 100 MG TABLET: 100 100 MG | ORAL | Qty: 1

## 2014-03-21 MED FILL — FENTANYL (PF) 50 MCG/ML INJECTION SOLUTION: 50 50 mcg/mL | INTRAMUSCULAR | Qty: 2

## 2014-03-21 MED FILL — AMLODIPINE 10 MG TABLET: 10 10 MG | ORAL | Qty: 1

## 2014-03-21 MED FILL — LIDOCAINE HCL 10 MG/ML (1 %) INJECTION SOLUTION: 10 10 mg/mL (1 %) | INTRAMUSCULAR | Qty: 20

## 2014-03-21 MED FILL — MIDAZOLAM (PF) 1 MG/ML INJECTION SOLUTION: 1 1 mg/mL | INTRAMUSCULAR | Qty: 2

## 2014-03-21 NOTE — Unmapped (Signed)
Procedure and discharge instructions reviewed with patient and family.  Verbalized teach back, all questions answered.

## 2014-03-21 NOTE — Unmapped (Signed)
[name] is a 59 y.o. male patient.  1. Chronic viral hepatitis B without delta agent and without coma    2. Abnormal liver enzymes      Past Medical History   Diagnosis Date   ??? Hypertension    ??? Diabetes mellitus    ??? COPD (chronic obstructive pulmonary disease)    ??? GERD (gastroesophageal reflux disease)      Blood pressure 180/99, pulse 73, temperature 98.5 ??F (36.9 ??C), temperature source Temporal, resp. rate 14, SpO2 98 %.    Procedures     Interventional Radiology Post Procedure Note  Date: 03/21/2014  Patient: [name]  MRN: [id number]    Name of the procedure performed:   Left liver lobe biopsy    Operators:   Dr Frutoso Schatz (VIR Attending)    Pre-operative diagnosis:   Elevated LFTs    Post-operative diagnosis:   Elevated LFTs    Indications of the procedure:   Elevated LFTs    Specimens removed:   Left liver lobe biopsy    Findings:   Left liver lobe biopsy    Medications:  Lidocaine 1% (Local)  Moderate Sedation (Fentanyl & Versed IV)    Estimated Blood Loss:  Minimal (Less Than 15 mL)     Complications:  None.    Recommendations/Followup:  Bed rest for 3 hours    -Please refer to full dictated Radiology report for details of findings/procedure.      Thank you,    Dr Frutoso Schatz, IR attending.  Pager [phone].          Elisa Sorlie  03/21/2014

## 2014-03-21 NOTE — Unmapped (Signed)
[name] tolerated liver biopsy without difficulty.  Pt recovered per protocol. Hypertensive meds given in recovery.  VSS   Filed Vitals:    03/21/14 1300   BP: 147/82   Pulse: 75   Temp:    Resp: 20   SpO2: 98%     Resp even easy.  Dressing CDI.  No s/s bleeding. IV removed intact.  Discharge instructions reviewed. Questions answered. Patient verbalizes understanding.  OK to discharge per Physician.  Patient discharged with belongings via wheelchair with friend/family.

## 2014-03-21 NOTE — Unmapped (Signed)
Interventional Radiology Consult Note  Date: 03/21/2014  Patient: [name]  MRN: [id number]      IR Procedure Request Received and Reviewed.    Pertinent Patient Information:  Allergies   Allergen Reactions   ??? Penicillins Hives     Lab Results   Component Value Date    WBC 5.3 03/03/2014    HGB 15.5 03/03/2014    HCT 46.9 03/03/2014    MCV 94.8 03/03/2014    PLT 140 03/03/2014     Lab Results   Component Value Date    INR 1.1 03/21/2014    INR 1.0 12/06/2013    PROTIME 13.4 03/21/2014    PROTIME 13.1 12/06/2013     Lab Results   Component Value Date    CREATININE 1.11 03/03/2014     Lab Results   Component Value Date    ALT 81* 03/03/2014    ALKPHOS 117 03/03/2014    BILITOT 1.6* 03/03/2014       ACA Classification:  ASA 3 - Patient with moderate systemic disease with functional limitations  The patient is in satisfactory condition for the procedure and sedation.    PE:  Constitutional: no acute distress appears stated age, normal affect.   Eyes: anicteric  Ears/Nose/Throat: no mucosal lesions or masses.   Cardiovascular: normal S1 and S2, regular rhythm, no murmur,   Respiratory breath sounds symmetric bilaterally, no adventitial sounds with auscultation, no respiratory distress.   Chest: no rib cage tenderness.  GI: abdomen soft nondistended and nontender  Neurological: symmetric motor  Psychiatric: oriented x 3, appropriate judgment, intact recent and remote memory.     Mallampati Score: 2      Sedation Plan/Type:  Local/Subcutaneous Lidocaine 1%  Moderate Sedation/Intravenous Fentanyl & Versed        Planned Procedure:  Liver biopsy, random    Please call with questions.    Thank you,    Dr Frutoso Schatz, IR attending.  Pager [phone].

## 2014-03-25 ENCOUNTER — Emergency Department: Admit: 2014-03-26 | Payer: MEDICAID

## 2014-03-25 DIAGNOSIS — M79604 Pain in right leg: Secondary | ICD-10-CM

## 2014-03-25 MED ORDER — OMNIPAQUE (iohexol) 350 mg iodine/mL 150 mL
350 | Freq: Once | INTRAVENOUS | Status: AC | PRN
Start: 2014-03-25 — End: 2014-03-26
  Administered 2014-03-26: 05:00:00 150 mL via INTRAVENOUS

## 2014-03-25 MED ORDER — oxyCODONE (ROXICODONE) immediate release tablet 5 mg
5 | Freq: Once | ORAL | Status: AC
Start: 2014-03-25 — End: 2014-03-25
  Administered 2014-03-26: 03:00:00 5 mg via ORAL

## 2014-03-25 MED ORDER — HYDROmorphone (DILAUDID) injection Syrg 1 mg
1 | Freq: Once | INTRAMUSCULAR | Status: AC
Start: 2014-03-25 — End: 2014-03-25
  Administered 2014-03-26: 04:00:00 1 mg via INTRAVENOUS

## 2014-03-25 MED FILL — HYDROMORPHONE 1 MG/ML INJECTION SYRINGE: 1 1 mg/mL | INTRAMUSCULAR | Qty: 1

## 2014-03-25 MED FILL — OXYCODONE 5 MG TABLET: 5 5 MG | ORAL | Qty: 1

## 2014-03-25 MED FILL — OMNIPAQUE 350 MG IODINE/ML INTRAVENOUS SOLUTION: 350 350 mg iodine/mL | INTRAVENOUS | Qty: 150

## 2014-03-25 NOTE — Unmapped (Signed)
Patient with Right leg pain started 2 days after having a liver biopsy. Pain is all the way from knee up to his hip.

## 2014-03-25 NOTE — Unmapped (Signed)
Faculty Attestation for Patient seen by a Resident Physician or Advanced Practice Provider    Date of Service: 03/25/2014    [name], is a 59 y.o. male presents with  has a past medical history of Hypertension; Diabetes mellitus; COPD (chronic obstructive pulmonary disease); and GERD (gastroesophageal reflux disease).a complaint of Leg Pain  .  The patient was seen and examined in room 5 , the case discussed with Dr. Cherlynn Kaiser. Please refer to the resident/MLP documentation for details of the case.   I agree with the history, physical exam and medical plan for [name].   Physical Exam findings on this patient include: Right thigh and leg tenderness    Patient Medications:    Patient's Medications   New Prescriptions    No medications on file   Previous Medications    ALBUTEROL INHL    Inhale into the lungs.    AMLODIPINE (NORVASC) 10 MG TABLET    Take 10 mg by mouth daily    BLOOD SUGAR DIAGNOSTIC STRP    Test daily    BLOOD-GLUCOSE METER MISC    by Does not apply route.    BUPROPION SR (WELLBUTRIN SR) 100 MG TABLET    Take 100 mg by mouth 2 times daily Ran out of prescription    CLONIDINE (CATAPRES) 0.2 MG/24 HR PATCH    Place 1 patch onto the skin once a week.    LORATADINE 10 MG CAP    Take 1 tablet by mouth daily    LOSARTAN (COZAAR) 100 MG TABLET    Take 100 mg by mouth daily    METFORMIN (GLUCOPHAGE) 500 MG TABLET    Take 500 mg by mouth 2 times a day with meals.    OXYCODONE-ACETAMINOPHEN (PERCOCET) 10-325 MG PER TABLET    1 tablet every 4 hours as needed.       TAMSULOSIN (FLOMAX) 0.4 MG CP24    Take 0.4 mg by mouth daily   Modified Medications    No medications on file   Discontinued Medications    No medications on file       Vital signs during this ED visit were:    Filed Vitals:    03/25/14 2034   BP: 136/82   Pulse: 88   Temp: 98.2 ??F (36.8 ??C)   TempSrc: Oral   Resp: 16   Height: 5' 11 (1.803 m)   Weight: 178 lb (80.74 kg)   SpO2: 99%

## 2014-03-25 NOTE — Unmapped (Signed)
Pt complaining of right leg pain from his waist down, pain started 2 days ago. Pt had a liver biopsy on Monday and they told him that he would have arm pain but pt is not having any arm pain at all, just the right leg pain.

## 2014-03-25 NOTE — Unmapped (Signed)
SCREENING PROTOCOL -- YES

## 2014-03-25 NOTE — Unmapped (Signed)
FSBS 90 RN adviced

## 2014-03-25 NOTE — Unmapped (Signed)
[facility] ED Note    Reason for Visit: Leg Pain      Patient History     HPI:  This is a 59 y.o. male with history of COPD, HBV, GERD presenting for R leg pain.    The patient underwent a liver bx for HBV 2 days ago.  It was an outpatient procedure and he was doing well and thus D/Ced home.  The patient states that he has been having pain in his right leg.  He states that it is in his right hip and in his back.  He statse that he has tightness from his R thigh to his right foot.  He is unsure of swelling but no obvious swelling and this is much worse when he is on his feet.  He has pain in his leg that is worse with wakling and he states that when he is walking he has pressure build up in his leg.  No fevers or chills or nausea or emesis.  No abdominal pain.  No tingling or numbness    Pt has not had any recent surgeries or travel or trips.  He has been keeping the leg elevated but is having severe pain when it is lowered.        Past Medical History   Diagnosis Date   ??? Hypertension    ??? Diabetes mellitus    ??? COPD (chronic obstructive pulmonary disease)    ??? GERD (gastroesophageal reflux disease)        Past Surgical History   Procedure Laterality Date   ??? Hernia repair     ??? Foot surgery Left    ??? Esophagogastroduodenoscopy N/A 11/19/2013     Procedure: ESOPHAGOGASTRODUODENOSCOPY WITH MAC;  Surgeon: Charlane Ferretti, MD;  Location: [facility] ENDOSCOPY;  Service: Gastroenterology;  Laterality: N/A;       ADAIR LAUDERBACK  reports that he has been smoking Cigarettes.  He has been smoking about 0.25 packs per day. He does not have any smokeless tobacco history on file. He reports that he does not drink alcohol or use illicit drugs.    Patient's Medications   New Prescriptions    No medications on file   Previous Medications    ALBUTEROL INHL    Inhale into the lungs.    AMLODIPINE (NORVASC) 10 MG TABLET    Take 10 mg by mouth daily    BLOOD SUGAR DIAGNOSTIC STRP    Test  daily    BLOOD-GLUCOSE METER MISC    by Does not apply route.    BUPROPION SR (WELLBUTRIN SR) 100 MG TABLET    Take 100 mg by mouth 2 times daily Ran out of prescription    CLONIDINE (CATAPRES) 0.2 MG/24 HR PATCH    Place 1 patch onto the skin once a week.    LORATADINE 10 MG CAP    Take 1 tablet by mouth daily    LOSARTAN (COZAAR) 100 MG TABLET    Take 100 mg by mouth daily    METFORMIN (GLUCOPHAGE) 500 MG TABLET    Take 500 mg by mouth 2 times a day with meals.    OXYCODONE-ACETAMINOPHEN (PERCOCET) 10-325 MG PER TABLET    1 tablet every 4 hours as needed.       TAMSULOSIN (FLOMAX) 0.4 MG CP24    Take 0.4 mg by mouth daily   Modified Medications    No medications on file   Discontinued Medications    No medications on file  Allergies:   Allergies as of 03/25/2014 - Fully Reviewed 03/25/2014   Allergen Reaction Noted   ??? Penicillins Hives 04/22/2013       Review of Systems     ROS:  The patient denies chest pain, shortness of breath, headache, syncope, abdominal pain   +leg pain  All pertinent negatives and positives noted in HPI; All other systems negative unless otherwise noted.      Physical Exam       Triage Vitals:  Filed Vitals:    03/25/14 2034   BP: 136/82   Pulse: 88   Temp: 98.2 ??F (36.8 ??C)   Resp: 16   SpO2: 99%       General:  This is a WD/WN male in no acute distress who appears their stated age.    HEENT:  NC/AT.  PERRL.  OP clear.  MMM.    Neck:  Supple.  Trachea midline.    Pulmonary:  Good effort, no accessory muscle use.  Increased AP diameter, CTAB without adventitious sounds.    Cardiac:  RRR -m/g/r.    Abdomen:  Puncture wound to the mid epigastrium appears well-healed, no palpable liver edge, abdomen soft and nontender diffusely without organomegaly or rebound or guarding, normal active bowel sounds    Musculoskeletal:  Moderate tenderness to palpation of right calf and right thigh, negative straight leg raise tests and no tenderness to palpation of the right SI joint, 5 out of 5 strength  diffusely, no lower extremity edema    Vascular:  +2 radial/DP pulses.    Skin:  Warm and dry with no lesions noted.     Neuro:  AAOx4.  Face symmetric, facial sensation in tact, uvula/tongue midline, PERRLA; EOMI.  SILT diffusely.  Strength grossly equal and symmetric.  Normal gait.       Diagnostic Studies     Labs:    Please see electronic medical record for any tests performed in the ED     Radiology:    Please see electronic medical record for any tests performed in the ED    EKG:    No EKG Performed    Emergency Department Procedures         ED Course and MDM     JAMEON DELLER is a 59 y.o. male with history of COPD, HBV, GERD presenting for R leg pain..  The patient was evaluated by myself as well as the ED attending physician.  Nursing notes, vital signs, PMH, Social and Family Hx reviewed     The patient is presenting with leg pain 2 days after a recent liver biopsy.  On clinical exam, he has no peritonitis and no asymmetric leg swelling.  He has good distal pulses and are warm and well perfused extremity.  However, clinically it is somewhat concerning for deep vein thrombosis versus some type of psoas pathology.  Thus, initial labs were done including a d-dimer screening for deep vein thrombosis and the d-dimer was actually negative.  The patient continued having pain and the differential diagnosis may be anything from sciatica given the radiation of his pain from his back to his flank to his leg to a psoas abscess and therefore a CT scan of his abdomen and pelvis was ordered with IV contrast.  CT scan was performed and this will be pending at the change of shift.  Pending a normal CT scan I expect this patient will be dispositioned home with medical management of sciatica  Summary of Treatment in ED:  Medications - No data to display    Impression:  Back pain and leg pain    Disposition:   *At this time the patient is going to be signed out to one of my colleagues at the change of shift. Please refer  to their notes for further workup, treatment, and disposition of this patient in the emergency department.          Gardiner Sleeper, MD  Resident  03/26/14 [phone]

## 2014-03-25 NOTE — Unmapped (Signed)
Patient updated on plan of care and informed of any delays.  Call light is in reach and bed rails up for safety.  No concerns voiced at this time. The patient was advised to use call light if any concerns or questions.

## 2014-03-25 NOTE — Unmapped (Signed)
Family at bedside.

## 2014-03-26 ENCOUNTER — Inpatient Hospital Stay: Admit: 2014-03-26 | Discharge: 2014-03-26 | Disposition: A | Discharge status: Home / Self Care | Payer: MEDICAID

## 2014-03-26 LAB — BASIC METABOLIC PANEL
Anion Gap: 5 mmol/L (ref 3–16)
BUN: 12 mg/dL (ref 7–25)
CO2: 26 mmol/L (ref 21–33)
Calcium: 9.7 mg/dL (ref 8.6–10.3)
Chloride: 103 mmol/L (ref 98–110)
Creatinine: 1.07 mg/dL (ref 0.60–1.30)
GFR MDRD Af Amer: 86 See note.
GFR MDRD Non Af Amer: 71 See note.
Glucose: 85 mg/dL (ref 70–100)
Osmolality, Calculated: 277 mOsm/kg (ref 278–305)
Potassium: 3.9 mmol/L (ref 3.5–5.3)
Sodium: 134 mmol/L (ref 133–146)

## 2014-03-26 LAB — CBC
Hematocrit: 44.7 % (ref 38.5–50.0)
Hemoglobin: 15 g/dL (ref 13.2–17.1)
MCH: 31.5 pg (ref 27.0–33.0)
MCHC: 33.6 g/dL (ref 32.0–36.0)
MCV: 93.8 fL (ref 80.0–100.0)
MPV: 9.6 fL (ref 7.5–11.5)
Platelets: 158 10*3/uL (ref 140–400)
RBC: 4.77 10*6/uL (ref 4.20–5.80)
RDW: 14.4 % (ref 11.0–15.0)
WBC: 7.8 10*3/uL (ref 3.8–10.8)

## 2014-03-26 LAB — HEPATIC FUNCTION PANEL
ALT: 63 U/L (ref 7–52)
AST: 40 U/L (ref 13–39)
Albumin: 4.2 g/dL (ref 3.5–5.7)
Alkaline Phosphatase: 107 U/L (ref 36–125)
Bilirubin, Direct: 0.3 mg/dL (ref 0.0–0.4)
Bilirubin, Indirect: 1.2 mg/dL (ref 0.0–1.1)
Total Bilirubin: 1.5 mg/dL (ref 0.0–1.5)
Total Protein: 7.4 g/dL (ref 6.4–8.9)

## 2014-03-26 LAB — LIPASE: Lipase: 6 U/L (ref 4–82)

## 2014-03-26 LAB — DIFFERENTIAL
Basophils Absolute: 31 /uL (ref 0–200)
Basophils Relative: 0.4 % (ref 0.0–1.0)
Eosinophils Absolute: 39 /uL (ref 15–500)
Eosinophils Relative: 0.5 % (ref 0.0–8.0)
Lymphocytes Absolute: 2621 /uL (ref 850–3900)
Lymphocytes Relative: 33.6 % (ref 15.0–45.0)
Monocytes Absolute: 725 /uL (ref 200–950)
Monocytes Relative: 9.3 % (ref 0.0–12.0)
Neutrophils Absolute: 4384 /uL (ref 1500–7800)
Neutrophils Relative: 56.2 % (ref 40.0–80.0)

## 2014-03-26 LAB — POC GLU MONITORING DEVICE: POC Glucose Monitoring Device: 90 mg/dL (ref 70–100)

## 2014-03-26 LAB — DDIMER: D-Dimer: 0.31 ug/mL FEU (ref 0.00–0.50)

## 2014-03-26 MED ORDER — oxyCODONE (ROXICODONE) 5 MG immediate release tablet
5 | ORAL_TABLET | ORAL | 0.00 refills | 6.00000 days | Status: AC | PRN
Start: 2014-03-26 — End: 2021-04-06

## 2014-03-26 MED ORDER — HYDROmorphone (DILAUDID) injection Syrg 1 mg
1 | Freq: Once | INTRAMUSCULAR | Status: AC
Start: 2014-03-26 — End: 2014-03-26

## 2014-03-26 NOTE — Unmapped (Signed)
Patient given D/C instructions and prescriptions.  Patient verbalized understanding.  No questions.  IV D/C'd with tip intact.  Patient ambulatory out of [facility] in stable condition.

## 2014-03-26 NOTE — Unmapped (Signed)
Please return if your pain gets worse, you get fevers, or if you develop any weakness in your extremities.

## 2014-03-27 DIAGNOSIS — M79604 Pain in right leg: Secondary | ICD-10-CM

## 2014-03-27 MED ORDER — oxyCODONE-acetaminophen (PERCOCET) 5-325 mg per tablet 2 tablet
5-325 | Freq: Once | ORAL | Status: AC
Start: 2014-03-27 — End: 2014-03-27
  Administered 2014-03-28: 03:00:00 2 via ORAL

## 2014-03-27 MED FILL — OXYCODONE-ACETAMINOPHEN 5 MG-325 MG TABLET: 5-325 5-325 mg | ORAL | Qty: 2

## 2014-03-27 NOTE — Unmapped (Signed)
Ambulatory pt discharged home. Pt respirations are easy even and unlabored. Pt is alert and oriented x 4. Pt skin is warm dry and intact. Pt in no acute distress at time of discharge. AVS signed.

## 2014-03-27 NOTE — Unmapped (Signed)
ED Note    Date of service:  03/27/2014    Reason for Visit: Leg Pain      Patient History     HPI   patient is a 59 year old male man who was here 2 days ago and returns to the ED with right sided leg pain and numbness.  Patient states when he was seen previously he was worked up for blood clot and was told to return to the emergency department if his pain became worse and that is why he presents.  He rates his pain as 9/10.  Patient states the pain originally started 4 days ago.  Patient states pain is worse with walking.  There are no alleviating factors.  Patient endorses diffuse right-sided numbness and localizes his pain over his right hip.  Patient also states he has a history of lower back degenerative joint disease for which he is on 10-325 mg Percocets.  Patient does not endorse any saddle anesthesia, incontinence, or IV Drug use.    Past Medical History   Diagnosis Date   ??? Hypertension    ??? Diabetes mellitus    ??? COPD (chronic obstructive pulmonary disease)    ??? GERD (gastroesophageal reflux disease)        Past Surgical History   Procedure Laterality Date   ??? Hernia repair     ??? Foot surgery Left    ??? Esophagogastroduodenoscopy N/A 11/19/2013     Procedure: ESOPHAGOGASTRODUODENOSCOPY WITH MAC;  Surgeon: Charlane Ferretti, MD;  Location: [facility] ENDOSCOPY;  Service: Gastroenterology;  Laterality: N/A;       Patient  reports that he has been smoking Cigarettes.  He has been smoking about 0.25 packs per day. He does not have any smokeless tobacco history on file. He reports that he does not drink alcohol or use illicit drugs.      Previous Medications    ALBUTEROL INHL    Inhale into the lungs.    AMLODIPINE (NORVASC) 10 MG TABLET    Take 10 mg by mouth daily    BLOOD SUGAR DIAGNOSTIC STRP    Test daily    BLOOD-GLUCOSE METER MISC    by Does not apply route.    BUPROPION SR (WELLBUTRIN SR) 100 MG TABLET    Take 100 mg by mouth 2 times daily Ran out  of prescription    CLONIDINE (CATAPRES) 0.2 MG/24 HR PATCH    Place 1 patch onto the skin once a week.    LORATADINE 10 MG CAP    Take 1 tablet by mouth daily    LOSARTAN (COZAAR) 100 MG TABLET    Take 100 mg by mouth daily    METFORMIN (GLUCOPHAGE) 500 MG TABLET    Take 500 mg by mouth 2 times a day with meals.    OXYCODONE (ROXICODONE) 5 MG IMMEDIATE RELEASE TABLET    Take 1 tablet (5 mg total) by mouth every 4 hours as needed for Pain.    OXYCODONE-ACETAMINOPHEN (PERCOCET) 10-325 MG PER TABLET    1 tablet every 4 hours as needed.       TAMSULOSIN (FLOMAX) 0.4 MG CP24    Take 0.4 mg by mouth daily       Allergies:   Allergies as of 03/27/2014 - Fully Reviewed 03/27/2014   Allergen Reaction Noted   ??? Penicillins Hives 04/22/2013       Review of Systems     Review of Systems    Review of systems was completed and  is negative unless otherwise specified in the HPI.    Physical Exam     ED Triage Vitals   Vital Signs Group      Temp 03/27/14 2140 98.2 ??F (36.8 ??C)      Temp Source 03/27/14 2140 Oral      Heart Rate 03/27/14 2140 85      Heart Rate Source 03/27/14 2140 Monitor;Automatic      Resp 03/27/14 2140 16      SpO2 03/27/14 2140 98 %      BP 03/27/14 2140 151/82 mmHg      BP Location 03/27/14 2140 Right arm      BP Method 03/27/14 2140 Automatic      Patient Position 03/27/14 2140 Lying   SpO2 03/27/14 2140 98 %   O2 Device 03/27/14 2140 None (Room air)       Physical Exam   Nursing note and vitals reviewed.  Constitutional: He appears well-developed and well-nourished. No distress.   Musculoskeletal:   Patient has pain with upper limits of flexion and internal rotation of hip.  Patient has good flexion of the spine and a stable right knee without any obvious weakness or deformity.  Patient has 5/5 lower extremity strength.  The patient has grossly intact sensation.  Patient has 2+ patellar reflexes.   Skin: He is not diaphoretic.         Diagnostic Studies     Labs:    No tests were performed during this ED  visit    Radiology:    No tests were performed during this ED visit    EKG:    No EKG Performed    Emergency Department Procedures     Procedures    ED Course and MDM     [name] is a 59 y.o. male who presented to the emergency department with Leg Pain      Patient presents to the ED with chief complaint is detailed in HPI.  A history and physical exam were completed as detailed above.  The patient was also seen by the attending provider on shift who assisted with the development of the treatment plan.    Chart review revealed patient received a workup for a blood clot at his previous visit on Friday.  Referenced this workup, I have a low index of suspicion for a deep vein thrombosis.  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  It is possible that his numbness is secondary to a sciatic pain from his previously diagnosed back pain.  It is also possible that the patient's symptoms is secondary to a neuropathic pain due to his history of diabetes.    However, the patient had a normal physical exam with 5/5 lower extremity strength and no obvious deformities.  Therefore, no imaging was pursued.  The patient was counseled on the importance of following up with his primary care doctor to assess whether an MRI may be appropriate to better evaluate the etiology of his symptoms.    Due to the fact the patient is on chronic pain medication with Percocet, no new medications were prescribed.  The patient was given standard ED return precautions.    The patient understood and agreed with the treatment plan and was in good condition at time of discharge.       Critical Care Time (Attendings)           Delice Bison [place], [place]  03/27/14 2220

## 2014-03-27 NOTE — Unmapped (Signed)
Pt presents to the emergency department with c/o  right leg numbness pt states he was seen here on Friday and he was told to come back if his leg became worse. Pt is alert and oriented x 4. Pt respirations are easy even and unlabored. Pt c/o pain and rates it at a 9 out of 10 on a 0 through 10 pain scale. Pt is able to move his right leg without difficulty. Pt ambulated from triage to room with no assistance. Will continue to monitor.

## 2014-03-27 NOTE — Unmapped (Signed)
Seen on Fri re right leg pain which started last Wed. States today he developed numbness right leg. Denies urinary and stool incontinence.

## 2014-03-27 NOTE — Unmapped (Signed)
ED Attending Attestation Note    Date of service:  03/27/2014    This patient was seen by the mid-level provider.  I have seen and examined the patient, agree with the workup, evaluation, management and diagnosis.  The care plan has been discussed and I concur.      My assessment reveals a 59 y.o. male with right leg pain and numbness.  On exam he is alert no acute distress.  On exam he has 5 x 5 and equal strength both flexion and extension of his lower extremities.  He has a negative straight leg sign bilaterally.  He has 1+ and deep tendon reflexes in his right knee and 2+ in his left.  He has 2+ and equal deep tendon reflexes ankles.[name]

## 2014-03-27 NOTE — Unmapped (Signed)
It is important that you contact Dr. Reginia Forts office tomorrow to schedule a follow-up appointment for further evaluation of your symptoms.  There is the potential for requiring an MRI of your lower back and/or right hip.    Return to the ED with any new or worsening symptoms.    No new medications were prescribed during this visit.    Sciatica  Sciatica is pain, weakness, numbness, or tingling along the path of the sciatic nerve. The nerve starts in the lower back and runs down the back of each leg. The nerve controls the muscles in the lower leg and in the back of the knee, while also providing sensation to the back of the thigh, lower leg, and the sole of your foot. Sciatica is a symptom of another medical condition. For instance, nerve damage or certain conditions, such as a herniated disk or bone spur on the spine, pinch or put pressure on the sciatic nerve. This causes the pain, weakness, or other sensations normally associated with sciatica. Generally, sciatica only affects one side of the body.  CAUSES   ?? Herniated or slipped disc.  ?? Degenerative disk disease.  ?? A pain disorder involving the narrow muscle in the buttocks (piriformis syndrome).  ?? Pelvic injury or fracture.  ?? Pregnancy.  ?? Tumor (rare).  SYMPTOMS   Symptoms can vary from mild to very severe. The symptoms usually travel from the low back to the buttocks and down the back of the leg. Symptoms can include:  ?? Mild tingling or dull aches in the lower back, leg, or hip.  ?? Numbness in the back of the calf or sole of the foot.  ?? Burning sensations in the lower back, leg, or hip.  ?? Sharp pains in the lower back, leg, or hip.  ?? Leg weakness.  ?? Severe back pain inhibiting movement.  These symptoms may get worse with coughing, sneezing, laughing, or prolonged sitting or standing. Also, being overweight may worsen symptoms.  DIAGNOSIS   Your caregiver will perform a physical exam to look for common symptoms of sciatica. He or she may ask you to  do certain movements or activities that would trigger sciatic nerve pain. Other tests may be performed to find the cause of the sciatica. These may include:  ?? Blood tests.  ?? X-rays.  ?? Imaging tests, such as an MRI or CT scan.  TREATMENT   Treatment is directed at the cause of the sciatic pain. Sometimes, treatment is not necessary and the pain and discomfort goes away on its own. If treatment is needed, your caregiver may suggest:  ?? Over-the-counter medicines to relieve pain.  ?? Prescription medicines, such as anti-inflammatory medicine, muscle relaxants, or narcotics.  ?? Applying heat or ice to the painful area.  ?? Steroid injections to lessen pain, irritation, and inflammation around the nerve.  ?? Reducing activity during periods of pain.  ?? Exercising and stretching to strengthen your abdomen and improve flexibility of your spine. Your caregiver may suggest losing weight if the extra weight makes the back pain worse.  ?? Physical therapy.  ?? Surgery to eliminate what is pressing or pinching the nerve, such as a bone spur or part of a herniated disk.  HOME CARE INSTRUCTIONS   ?? Only take over-the-counter or prescription medicines for pain or discomfort as directed by your caregiver.  ?? Apply ice to the affected area for 20 minutes, 3-4 times a day for the first 48-72 hours. Then try heat in  the same way.  ?? Exercise, stretch, or perform your usual activities if these do not aggravate your pain.  ?? Attend physical therapy sessions as directed by your caregiver.  ?? Keep all follow-up appointments as directed by your caregiver.  ?? Do not wear high heels or shoes that do not provide proper support.  ?? Check your mattress to see if it is too soft. A firm mattress may lessen your pain and discomfort.  SEEK IMMEDIATE MEDICAL CARE IF:   ?? You lose control of your bowel or bladder (incontinence).  ?? You have increasing weakness in the lower back, pelvis, buttocks, or legs.  ?? You have redness or swelling of your  back.  ?? You have a burning sensation when you urinate.  ?? You have pain that gets worse when you lie down or awakens you at night.  ?? Your pain is worse than you have experienced in the past.  ?? Your pain is lasting longer than 4 weeks.  ?? You are suddenly losing weight without reason.  MAKE SURE YOU:  ?? Understand these instructions.  ?? Will watch your condition.  ?? Will get help right away if you are not doing well or get worse.  Document Released: 01/08/2001 Document Revised: 07/16/2011 Document Reviewed: 05/26/2011  ExitCare?? Patient Information ??2015 [place], [place]. This information is not intended to replace advice given to you by your health care provider. Make sure you discuss any questions you have with your health care provider.

## 2014-03-28 ENCOUNTER — Inpatient Hospital Stay: Admit: 2014-03-28 | Discharge: 2014-03-28 | Disposition: A | Discharge status: Home / Self Care | Payer: MEDICAID

## 2014-03-29 NOTE — Unmapped (Signed)
CT & Liver Biopsy results along w/ last OV notes all faxed to Dr. Reginia Forts office per patients request.

## 2014-04-04 ENCOUNTER — Ambulatory Visit: Admit: 2014-04-04 | Payer: MEDICAID | Attending: Gastroenterology

## 2014-04-04 DIAGNOSIS — B181 Chronic viral hepatitis B without delta-agent: Secondary | ICD-10-CM

## 2014-04-04 MED ORDER — entecavir (BARACLUDE) 0.5 MG tablet
0.5 | ORAL_TABLET | Freq: Every day | ORAL | Status: AC
Start: 2014-04-04 — End: 2014-05-04

## 2014-04-04 MED ORDER — entecavir (BARACLUDE) 0.5 MG tablet
0.5 | ORAL_TABLET | Freq: Every day | ORAL | Status: AC
Start: 2014-04-04 — End: 2014-04-04

## 2014-04-04 NOTE — Unmapped (Signed)
Chief Complaint   Patient presents with   ??? 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     [place] FU per [employer], DO  [address]  [place], [place] [address]       History of Present Illness:    Patient feeling okay, no new problems  Very anxious about his liver biopsy results   Not drinking any alcohol   Not smoking  Taking Prilosec once daily   No new issues  No leg swelling or belly swelling     Visit 03/03/13    Has not gotten Liver biopsy  Acid reflux still controlled 50%  Taking Prilosec once daily   Still drinking soda    Visit 12/06/13    Had EGD done  Still 50% controlled   No new problems  Anxious about his HCV      Visit 11/08/13    Acid reflux for total of 10 years  used Prilosec once daily   Takes acid medication before meals   Coffee - 1 cup per day, chocolate - every now and day, mint gums  Alcohol -  none  Soda - 2-3 cans, regular pepsi  Smoking - none   Sleeps with 2 pillows at night to prop up  Sometime sits up at night time due to bad acid reflux   Has bad acid reflux at night    Acid controlled 50% on the pill     Occasional dysphagia to solids   No black stools  No weight loss    Never had upper endoscopy     Saw that he had abnormal liver enzymes with Dr.Doriott and sent to GI   No drug use now, no liver problems that he knows of   Used cocaine until 2 years ago - smoking it, denies snorting it anytime  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    Patient Active Problem List    Diagnosis   ??? Chronic viral hepatitis B without delta agent and without coma   ??? Hiatal hernia   ??? Gastroesophageal reflux disease without esophagitis   ??? Abnormal liver enzymes   ??? Personal history of other specified diseases      Patient Active Problem List    Diagnosis   ??? Chronic viral hepatitis B without delta agent and without coma   ??? Hiatal hernia   ??? Gastroesophageal reflux disease without esophagitis   ??? Abnormal liver enzymes   ??? Personal history of other specified diseases       Medications:    Current outpatient prescriptions:   ???  ALBUTEROL INHL, Inhale into the lungs., Disp: , Rfl:   ???  amLODIPine (NORVASC) 10 MG tablet, Take 10 mg by mouth daily, Disp: , Rfl:   ???  blood sugar diagnostic Strp, Test daily, Disp: , Rfl:   ???  blood-glucose meter Misc, by Does not apply route., Disp: , Rfl:   ???  buPROPion SR (WELLBUTRIN SR) 100 MG tablet, Take 100 mg by mouth 2 times daily Ran out of prescription, Disp: , Rfl:   ???  cloNIDine (CATAPRES) 0.2 mg/24 hr  patch, Place 1 patch onto the skin once a week., Disp: , Rfl:   ???  FLUoxetine (PROZAC) 20 MG capsule, , Disp: , Rfl:   ???  ketoconazole (NIZORAL) 2 % cream, , Disp: , Rfl:   ???  loratadine 10 mg Cap, Take 1 tablet by mouth daily, Disp: , Rfl:   ???  losartan (COZAAR) 100 MG tablet, Take 100 mg by mouth daily, Disp: , Rfl:   ???  metFORMIN (GLUCOPHAGE) 500 MG tablet, Take 500 mg by mouth 2 times a day with meals., Disp: , Rfl:   ???  oxyCODONE (ROXICODONE) 5 MG immediate release tablet, Take 1 tablet (5 mg total) by mouth every 4 hours as needed for Pain., Disp: 30 tablet, Rfl: 0  ???  oxyCODONE-acetaminophen (PERCOCET) 10-325 mg per tablet, 1 tablet every 4 hours as needed.  , Disp: , Rfl:   ???  tamsulosin (FLOMAX) 0.4 mg Cp24, Take 0.4 mg by mouth daily, Disp: , Rfl:   ???  traZODone (DESYREL) 100 MG tablet, , Disp: , Rfl:     Allergies:  Penicillins    Family History:  His family history is not on file.    Past Surgical History:  He has past surgical history that includes Hernia repair; Foot surgery (Left); and Esophagogastroduodenoscopy (N/A, 11/19/2013).    Social History:  He reports that he has been smoking Cigarettes.  He has been smoking about 0.25 packs per day. He does not have any  smokeless tobacco history on file. He reports that he does not drink alcohol or use illicit drugs.      Review of Systems:  * See scanned Review of Systems sheet.    Vital Signs:  Blood pressure 158/90, pulse 100, resp. rate 16, height 5' 9 (1.753 m), weight 178 lb (80.74 kg).     Physical Exam   Constitutional: He appears well-developed and well-nourished.   HENT:   Head: Normocephalic and atraumatic.   Eyes: Pupils are equal, round, and reactive to light. Right eye exhibits no discharge. Left eye exhibits no discharge. No scleral icterus.   Neck: No JVD present. No tracheal deviation present.   Cardiovascular: Normal rate and regular rhythm.    Pulmonary/Chest: Effort normal and breath sounds normal.   Abdominal: Soft. Bowel sounds are normal. There is no tenderness. There is no rebound.   Musculoskeletal: He exhibits no edema or tenderness.   Neurological: No cranial nerve deficit. Coordination normal.   Skin: Skin is warm and dry. No rash noted. No erythema.   Psychiatric: He has a normal mood and affect. His behavior is normal.       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  - Negative for steatosis.  - See microscopic description.  CT abdomen - 03/25/13 - numerous subcentimeter lesions seen throughout the liver     Compensated cirrhosis   Will start treatment with Entecavir 0.5 mg PO daily   liver biopsy showed cirrhosis ( age> 40)  Treatment started as biopsy shows moderate inflammation and cirrhosis     Chronic liver work up showed mild elevated anti-smooth muscle antibody, do not think it is of clinical significance (Ig G normal)  HCV  - negative  HAV - not immune   Got 2 doses so far     Some studies suggest aggressive disease/HCC development with core promoter mutations but not entirely clear based on literature     Drug screen negative     EGD with hiatal hernia   Continue PPI 2 times daily   Stop chocolate, mint and soda  Counseled on avoiding coffee,chocolate, alcohol,fatty foods, mint containing products which can make GERD worse  Take PPI on empty stomach, about 30 mins prior to meals   Avoid soda and any other carbonated beverages   Raise the head of the bed six to eight inches prior to sleeping  Avoid large and late meals, eat dinner atleast 3 hours prior to bed time   Avoid tight fitting clothing   GERD diet information given to patient    U/S abdomen every 6 months    AFP - normal   and HDV antibody - negative     MRI liver to assess the numerous sub-centimeter lesions seen on CT scan 03/25/13    Follow up in August  2016

## 2014-04-04 NOTE — Unmapped (Signed)
Cirrhosis information  Follow up 1 st week of August 2016  MRI scan

## 2014-04-05 NOTE — Unmapped (Signed)
Received PA approval for BARACLUDE #[id number] good thru 10/06/14.  For renewal need to include progress notes documenting therapeutic response, hepatitis B serology, disease progression, and/or unacceptable toxicities. *Pt notified of approval.

## 2014-04-05 NOTE — Unmapped (Signed)
Patient stating that he was supposed to be taking this medication entecavir yesterday, and would like to know how he is supposed to take if it is not at the pharmacy. Patient is not understanding the PA, after explaining it to him. PH: [phone]

## 2014-04-05 NOTE — Unmapped (Signed)
BARACLUDE PA initiated.

## 2014-04-05 NOTE — Unmapped (Signed)
Called lvm explaining the medication will first be coming from a specialty pharmacy so it will arrive by mail and describing the PA process.  Explained all the forms have been completed and successfully faxed to the insurance company and now we wait for a response.  Told him he could call me back if he has further questions.

## 2014-04-13 ENCOUNTER — Inpatient Hospital Stay: Admit: 2014-04-14 | Payer: MEDICAID | Attending: Gastroenterology

## 2014-04-13 DIAGNOSIS — K769 Liver disease, unspecified: Secondary | ICD-10-CM

## 2014-04-14 ENCOUNTER — Inpatient Hospital Stay: Payer: MEDICAID | Attending: Gastroenterology

## 2014-04-27 ENCOUNTER — Inpatient Hospital Stay: Admit: 2014-04-27 | Discharge: 2014-04-27 | Disposition: A | Discharge status: Home / Self Care | Payer: MEDICAID

## 2014-04-27 DIAGNOSIS — K529 Noninfective gastroenteritis and colitis, unspecified: Secondary | ICD-10-CM

## 2014-04-27 LAB — DIFFERENTIAL
Basophils Absolute: 16 /uL (ref 0–200)
Basophils Relative: 0.3 % (ref 0.0–1.0)
Eosinophils Absolute: 16 /uL (ref 15–500)
Eosinophils Relative: 0.3 % (ref 0.0–8.0)
Lymphocytes Absolute: 1230 /uL (ref 850–3900)
Lymphocytes Relative: 23.2 % (ref 15.0–45.0)
Monocytes Absolute: 578 /uL (ref 200–950)
Monocytes Relative: 10.9 % (ref 0.0–12.0)
Neutrophils Absolute: 3461 /uL (ref 1500–7800)
Neutrophils Relative: 65.3 % (ref 40.0–80.0)

## 2014-04-27 LAB — HEPATIC FUNCTION PANEL
ALT: 111 U/L (ref 7–52)
AST: 62 U/L (ref 13–39)
Albumin: 3.7 g/dL (ref 3.5–5.7)
Alkaline Phosphatase: 95 U/L (ref 36–125)
Bilirubin, Direct: 0.3 mg/dL (ref 0.0–0.4)
Bilirubin, Indirect: 1 mg/dL (ref 0.0–1.1)
Total Bilirubin: 1.3 mg/dL (ref 0.0–1.5)
Total Protein: 6.9 g/dL (ref 6.4–8.9)

## 2014-04-27 LAB — BASIC METABOLIC PANEL
Anion Gap: 6 mmol/L (ref 3–16)
BUN: 23 mg/dL (ref 7–25)
CO2: 22 mmol/L (ref 21–33)
Calcium: 8.8 mg/dL (ref 8.6–10.3)
Chloride: 108 mmol/L (ref 98–110)
Creatinine: 1.11 mg/dL (ref 0.60–1.30)
Glucose: 89 mg/dL (ref 70–100)
Osmolality, Calculated: 285 mOsm/kg (ref 278–305)
Potassium: 3.3 mmol/L (ref 3.5–5.3)
Sodium: 136 mmol/L (ref 133–146)
eGFR AA CKD-EPI: 84 See note.
eGFR NONAA CKD-EPI: 73 See note.

## 2014-04-27 LAB — CBC
Hematocrit: 45.9 % (ref 38.5–50.0)
Hemoglobin: 15.4 g/dL (ref 13.2–17.1)
MCH: 32 pg (ref 27.0–33.0)
MCHC: 33.6 g/dL (ref 32.0–36.0)
MCV: 95.2 fL (ref 80.0–100.0)
MPV: 9.8 fL (ref 7.5–11.5)
Platelets: 131 10*3/uL (ref 140–400)
RBC: 4.82 10*6/uL (ref 4.20–5.80)
RDW: 14.8 % (ref 11.0–15.0)
WBC: 5.3 10*3/uL (ref 3.8–10.8)

## 2014-04-27 LAB — URINALYSIS, MICROSCOPIC
RBC, UA: 9 /HPF (ref 0–3)
Squam Epithel, UA: 5 /HPF (ref 0–5)
WBC, UA: 31 /HPF (ref 0–5)

## 2014-04-27 LAB — URINALYSIS-MACROSCOPIC W/REFLEX TO MICROSCOPIC
Bilirubin, UA: NEGATIVE
Glucose, UA: NEGATIVE mg/dL
Nitrite, UA: NEGATIVE
Protein, UA: 30 mg/dL
Specific Gravity, UA: >=1.03 (ref 1.005–1.035)
Urobilinogen, UA: 0.2 EU/dL (ref 0.2–1.0)
pH, UA: 6 (ref 5.0–8.0)

## 2014-04-27 LAB — LIPASE: Lipase: <3 U/L (ref 4–82)

## 2014-04-27 LAB — URINE CULTURE

## 2014-04-27 MED ORDER — sodium chloride 0.9 % 1,000 mL IV fluid
Freq: Once | INTRAVENOUS | Status: AC
Start: 2014-04-27 — End: 2014-04-27
  Administered 2014-04-27: 12:00:00 1000 mL via INTRAVENOUS

## 2014-04-27 MED ORDER — cefTRIAXone (ROCEPHIN) 1 g in D5W 50 mL IVPB (Duplex)
1 | Freq: Once | INTRAVENOUS | Status: AC
Start: 2014-04-27 — End: 2014-04-27
  Administered 2014-04-27: 16:00:00 1 g via INTRAVENOUS

## 2014-04-27 MED ORDER — ondansetron (ZOFRAN) 4 mg/2 mL injection 4 mg
4 | Freq: Once | INTRAMUSCULAR | Status: AC
Start: 2014-04-27 — End: 2014-04-27
  Administered 2014-04-27: 12:00:00 4 mg via INTRAVENOUS

## 2014-04-27 MED ORDER — morphine injection 4 mg
4 | Freq: Once | INTRAMUSCULAR | Status: AC
Start: 2014-04-27 — End: 2014-04-27
  Administered 2014-04-27: 13:00:00 4 mg via INTRAVENOUS

## 2014-04-27 MED ORDER — ondansetron (ZOFRAN) 4 mg/2 mL injection 4 mg
4 | Freq: Once | INTRAMUSCULAR | Status: AC
Start: 2014-04-27 — End: 2014-04-27
  Administered 2014-04-27: 15:00:00 4 mg via INTRAVENOUS

## 2014-04-27 MED ORDER — ondansetron (ZOFRAN ODT) 4 MG disintegrating tablet
4 | ORAL_TABLET | Freq: Four times a day (QID) | ORAL | Status: AC | PRN
Start: 2014-04-27 — End: 2021-03-20

## 2014-04-27 MED ORDER — doxycycline (VIBRA-TABS) 100 MG tablet
100 | ORAL_TABLET | Freq: Two times a day (BID) | ORAL | Status: AC
Start: 2014-04-27 — End: 2014-05-04

## 2014-04-27 MED ORDER — sodium chloride 0.9 % 1,000 mL IV fluid
Freq: Once | INTRAVENOUS | Status: AC
Start: 2014-04-27 — End: 2014-04-27
  Administered 2014-04-27: 15:00:00 1000 mL via INTRAVENOUS

## 2014-04-27 MED FILL — ONDANSETRON HCL (PF) 4 MG/2 ML INJECTION SOLUTION: 4 4 mg/2 mL | INTRAMUSCULAR | Qty: 2

## 2014-04-27 MED FILL — MORPHINE 4 MG/ML INJECTION SYRINGE: 4 4 mg/mL | INTRAMUSCULAR | Qty: 1

## 2014-04-27 MED FILL — CEFTRIAXONE 1 GRAM/50 ML IN DEXTROSE (ISO-OSMOT) INTRAVENOUS PIGGYBACK: 1 1 gram/50 mL | INTRAVENOUS | Qty: 50

## 2014-04-27 NOTE — Unmapped (Signed)
Urine collected and sent to lab.

## 2014-04-27 NOTE — Unmapped (Signed)
Vomiting and diarrhea all night -

## 2014-04-27 NOTE — Unmapped (Signed)
Patient directed off of the unit

## 2014-04-27 NOTE — Unmapped (Signed)
[facility] ED Note    Reason for Visit: Emesis      Patient History     HPI: [name] is a 59 y.o. male who presented to the emergency department with Emesis   has history of hepatitis B taking anti-retroviral's for this.  Yesterday went to work and [name] and developed intermittent abdominal pain and cramping with some nausea and vomiting and diarrhea.  She has eased but not gone away completely over the course of the night time and [name] comes to merge department this morning for these continued symptoms.  [name] abdominal cramping has lessened and [name] is tolerating sips of water from a water bottle.  [name] is concerned that [name]'s missed one dose of [name] anti-viral medication.       Past Medical History   Diagnosis Date   ??? Hypertension    ??? Diabetes mellitus    ??? COPD (chronic obstructive pulmonary disease)    ??? GERD (gastroesophageal reflux disease)    ??? Hepatitis        Past Surgical History   Procedure Laterality Date   ??? Hernia repair     ??? Foot surgery Left    ??? Esophagogastroduodenoscopy N/A 11/19/2013     Procedure: ESOPHAGOGASTRODUODENOSCOPY WITH MAC;  Surgeon: Charlane Ferretti, MD;  Location: [facility] ENDOSCOPY;  Service: Gastroenterology;  Laterality: N/A;       [name]  reports that [name] has been smoking Cigarettes.  [name] has been smoking about 0.25 packs per day. [name] does not have any smokeless tobacco history on file. [name] reports that [name] does not drink alcohol or use illicit drugs.    Previous Medications    ALBUTEROL INHL    Inhale into the lungs.    AMLODIPINE (NORVASC) 10 MG TABLET    Take 10 mg by mouth daily    BLOOD SUGAR DIAGNOSTIC STRP    Test daily    BLOOD-GLUCOSE METER MISC    by Does not apply route.    BUPROPION SR (WELLBUTRIN SR) 100 MG TABLET    Take 100 mg by mouth 2 times daily Ran out of prescription    CLONIDINE (CATAPRES) 0.2 MG/24 HR PATCH    Place 1 patch onto the skin once a week.    ENTECAVIR (BARACLUDE) 0.5 MG TABLET    Take 1  tablet (0.5 mg total) by mouth daily for 30 days.    FLUOXETINE (PROZAC) 20 MG CAPSULE        KETOCONAZOLE (NIZORAL) 2 % CREAM        LORATADINE 10 MG CAP    Take 1 tablet by mouth daily    LOSARTAN (COZAAR) 100 MG TABLET    Take 100 mg by mouth daily    METFORMIN (GLUCOPHAGE) 500 MG TABLET    Take 500 mg by mouth 2 times a day with meals.    OXYCODONE (ROXICODONE) 5 MG IMMEDIATE RELEASE TABLET    Take 1 tablet (5 mg total) by mouth every 4 hours as needed for Pain.    OXYCODONE-ACETAMINOPHEN (PERCOCET) 10-325 MG PER TABLET    1 tablet every 4 hours as needed.       TAMSULOSIN (FLOMAX) 0.4 MG CP24    Take 0.4 mg by mouth daily    TRAZODONE (DESYREL) 100 MG TABLET           Allergies:   Allergies as of 04/27/2014 - Fully Reviewed 04/27/2014   Allergen Reaction Noted   ??? Penicillins Hives 04/22/2013  Review of Systems     ROS:     Gen: normal appearance  Head: no trauma  Neck: supple  Pulm: no shortness of breath  Cardiac:  No chest pain  Abd: See HPI  GU: no dysuria, hesitancy or hematuria  Neuro:  Normal gait and strength  Psych: normal interaction  Integument: no rash        Physical Exam     ED Triage Vitals   Vital Signs Group      Temp 04/27/14 0728 98.7 ??F (37.1 ??C)      Temp Source 04/27/14 0728 Oral      Heart Rate 04/27/14 0728 112      Heart Rate Source 04/27/14 0728 Automatic      Resp 04/27/14 0728 18      SpO2 04/27/14 0728 98 %      BP 04/27/14 0728 150/102 mmHg      BP Location 04/27/14 0728 Right arm      BP Method 04/27/14 0728 Automatic      Patient Position 04/27/14 0728 Sitting   SpO2 04/27/14 0728 98 %   O2 Device 04/27/14 0728 None (Room air)       General:  WDWN in NAD    HEENT:  Normocephalic, non signs of trauma, PERRL, EOMI,     Neck:  Supple with FROM    Pulmonary:   CTA billaterally    Cardiac: Tachycardic rate but normal rhythm without murmur gallop or rub    Abdomen:  soft, minimally tender diffusely, nondistended with normal bowel sounds    Musculoskeletal:  No signs of  trauma    Vascular:  Appears to have good perfusion throughout    Skin:  Warm, dry and intact    Neuro:  Alert and oriented with good strength and gait.    Psych:  Normal interaction      Diagnostic Studies     Labs:    Labs Reviewed   BASIC METABOLIC PANEL   CBC   DIFFERENTIAL   HEPATIC FUNCTION PANEL   LIPASE   URINALYSIS-MACROSCOPIC W/REFLEX TO MICROSCOPIC       Radiology:    No tests were performed during this ED visit    EKG:    No EKG Performed    Emergency Department Procedures         ED Course and MDM     [name]  who presented to the emergency department with signs and symptoms of acute gastroenteritis in the setting of chronic hepatitis B.    [name] lab studies are reassuring except for a urinalysis that demonstrates 31 white cells with occasional clumps.  [name] denies urethral discharge at this time.  I have given him a dose of Rocephin 1 g IV and will discharge home with doxycycline for care of urinary tract infection or for possible STD if present.  [name] symptoms are completely consistent with acute gastroenteritis given the diarrhea as well however, we'll treat for this possible UTI as evidenced above.  Critical Care Time (Attendings)         Alyse Low, MD  04/27/14 1251

## 2014-06-07 NOTE — Unmapped (Signed)
Patient state he took last pill today of liver medication today, didn't know name of medicine.  Pt want to know if office can contact company and have it shipped asap.

## 2014-06-07 NOTE — Unmapped (Signed)
Received request for medical records and documentation for disability.  Passed to Dr. Asa Lente for completion at which time I will forward to medical records for completion.

## 2014-06-07 NOTE — Unmapped (Signed)
Received confirmation fax that patient called for his refill and he will receive it 06/08/14.

## 2014-06-07 NOTE — Unmapped (Signed)
Spoke to patient. Upon further digging I discovered that his refills come from [facility].  He is to call them each month at [phone] to get his refills.  They are aware he is out.  I gave him the number and he will see if they have an express shipment so that he can get them tomorrow.

## 2014-06-07 NOTE — Unmapped (Signed)
Patient called to follow up on his medication request Dorothy Spark?). Patient will be going out of town tomorrow, so he is asking if doctor can call a prescription into [facility] [phone]) instead of his mail order pharmacy.  If not, does the doctor have any samples he can give him. Patient can be reached at: [phone].

## 2014-06-30 NOTE — Unmapped (Signed)
Phone number left to call, advised our office only sends records so most likely she has everything she needs, if not advised to call the office

## 2014-06-30 NOTE — Unmapped (Signed)
Bethany with [employer] states that she is representing patient for [employer].  She is F/u on residual functional capacity questionnaire faxed on 05/17/14.  She also states that she received records from the hospital and would like to know if those are the only records.  Please advise.

## 2014-07-01 NOTE — Unmapped (Signed)
[name] called back to confirm that there no other records available outside of the records obtained from [facility]. [name] can be reached at: [phone].  (See notes from previously closed Encounter).    Danny Freeze, MA at 06/30/2014 ??4:59 PM  ??   ?? Status: Signed       ?? Expand All Collapse All    Phone number left to call, advised our office only sends records so most likely she has everything she needs, if not advised to call the office   ??      ??      ?? ?? [name], MA at 06/30/2014 11:12 AM  ??   ?? Status: Signed       ?? Expand All Collapse All    [name] with [employer] states that she is representing patient for [employer].?? She is F/u on residual functional capacity questionnaire faxed on 05/17/14.?? She also states that she received records from the hospital and would like to know if those are the only records.?? Please advise.

## 2014-07-01 NOTE — Unmapped (Signed)
Records faxed as requested

## 2014-07-01 NOTE — Unmapped (Signed)
Spoke to [place], unsure if records request received in our office as none documented.  Advised that she fax it again.  Records printed and will fax when request arrives

## 2014-08-05 NOTE — Unmapped (Signed)
Received request from [facility] for refills on Baraclude.  Patient still had 8 available refills so I called for clarification.  Patients script was transferred from their [place] mail order pharmacy to their [employer] order pharmacy and [employer] all refills are void.  Spoke to pharmacist who allowed a verbal clarification for the rest of the refills total 8.  The order will now be processed and they patient should receive his meds w/o delay.

## 2014-08-29 ENCOUNTER — Encounter: Payer: MEDICAID | Attending: Gastroenterology

## 2014-11-07 NOTE — Unmapped (Signed)
Request sent to Dr Durene Cal.

## 2014-11-07 NOTE — Unmapped (Signed)
Pharmacy requesting that a new Rx for Entecavir. [name] can be reached at [phone].

## 2014-11-08 MED ORDER — entecavir (BARACLUDE) 0.5 MG tablet
0.5 | ORAL_TABLET | Freq: Every day | ORAL | Status: AC
Start: 2014-11-08 — End: 2014-11-16

## 2014-11-08 NOTE — Unmapped (Signed)
New scriopt written.

## 2014-11-15 NOTE — Unmapped (Signed)
[name] w/ [facility] f/u on message. Requesting that Rx be sent to them not [facility]. [name] can be reached at [phone] ext [phone].    Danny Oppenheim, MD at 11/08/2014 ??4:15 PM  ??   ?? Status: Signed       ?? Expand All Collapse All    New scriopt written.   ??      ??      ?? ?? Luretha Murphy [place], MA at 11/07/2014 ??5:21 PM  ??   ?? Status: Signed       ?? Expand All Collapse All    Request sent to Dr Durene Cal. ??   ??      ??      ?? ?? Jahn Franchini E Malekai [place], MA at 11/07/2014 ??3:57 PM  ??   ?? Status: Signed       ?? Expand All Collapse All    Pharmacy requesting that a new Rx for Entecavir. [facility] can be reached at [phone].

## 2014-11-16 NOTE — Unmapped (Signed)
Request sent to Dr Durene Cal to have script sent to [facility] not [facility].

## 2014-11-17 MED ORDER — entecavir (BARACLUDE) 0.5 MG tablet
0.5 | ORAL_TABLET | Freq: Every day | ORAL | Status: AC
Start: 2014-11-17 — End: 2015-08-23

## 2014-12-30 NOTE — Unmapped (Signed)
Rceived request for med records to be fact to attorney. Faxed request to [employer]. Also sent copy of request to scan.

## 2015-01-17 NOTE — Unmapped (Signed)
Pt should have refill.  Pt needs to be seen in the office in the next 6-8 weeks.

## 2015-01-17 NOTE — Unmapped (Signed)
[facility] [phone] ext [phone], need to know if medication entecavir (BARACLUDE) 0.5 MG tablet should be refilled for the pt.

## 2015-01-18 NOTE — Unmapped (Signed)
Returned call to [employer]. No answer. Did not leave VM. Will try again.

## 2015-01-19 NOTE — Unmapped (Signed)
Tried calling [name] again. No answer, no message left. Will try again.

## 2015-01-31 NOTE — Unmapped (Addendum)
Called [name] again with no success in reaching him. LVM telling him the script does need to be refilled.Left my direct # if he has questions. Per appt desk. Pt has appt scheduled with Dr Dirk Dress Mar 22, 2015.

## 2015-02-24 ENCOUNTER — Inpatient Hospital Stay: Admit: 2015-02-24 | Discharge: 2015-02-24 | Disposition: A | Discharge status: Home / Self Care | Payer: MEDICAID

## 2015-02-24 DIAGNOSIS — G8929 Other chronic pain: Secondary | ICD-10-CM

## 2015-02-24 MED ORDER — ibuprofen (ADVIL,MOTRIN) 800 MG tablet
800 | ORAL_TABLET | Freq: Four times a day (QID) | ORAL | Status: AC | PRN
Start: 2015-02-24 — End: 2021-03-20

## 2015-02-24 MED ORDER — orphenadrine (NORFLEX) 100 mg tablet
100 | ORAL_TABLET | Freq: Two times a day (BID) | ORAL | 0.00 refills | 20.00000 days | Status: AC
Start: 2015-02-24 — End: 2021-03-21

## 2015-02-24 MED ORDER — ibuprofen (ADVIL,MOTRIN) 800 MG tablet
800 | ORAL_TABLET | Freq: Four times a day (QID) | ORAL | Status: AC | PRN
Start: 2015-02-24 — End: 2015-02-24

## 2015-02-24 MED ORDER — ibuprofen (ADVIL,MOTRIN) tablet 400 mg
400 | Freq: Once | ORAL | Status: AC
Start: 2015-02-24 — End: 2015-02-24
  Administered 2015-02-24: 22:00:00 400 mg via ORAL

## 2015-02-24 MED ORDER — orphenadrine (NORFLEX) 100 mg tablet
100 | ORAL_TABLET | Freq: Two times a day (BID) | ORAL | 0.00 refills | 20.00000 days | Status: AC
Start: 2015-02-24 — End: 2015-02-24

## 2015-02-24 MED FILL — IBUPROFEN 400 MG TABLET: 400 400 MG | ORAL | Qty: 1

## 2015-02-24 NOTE — Unmapped (Signed)
Patient c/o lower back pain since yesterday. Also has pain in right elbow.

## 2015-02-24 NOTE — Unmapped (Signed)
Patient discharged to home via self.  Written discharge/medication instructions reviewed with understanding.  Copy of AVS sent home with patient.  Patient able to walk from ED with steady gait.

## 2015-02-24 NOTE — Unmapped (Signed)
Tennis Elbow  Tennis elbow (lateral epicondylitis) is inflammation of the outer tendons of your forearm close to your elbow. Your tendons attach your muscles to your bones. The outer tendons of your forearm are used to extend your wrist, and they attach on the outside part of your elbow. Tennis elbow is often found in people who play tennis, but anyone may get the condition from repeatedly extending the wrist or turning the forearm.  CAUSES  This condition is caused by repeatedly extending your wrist and using your hands. It can result from sports or work that requires repetitive forearm movements. Tennis elbow may also be caused by an injury.  RISK FACTORS  You have a higher risk of developing tennis elbow if you play tennis or another racquet sport. You also have a higher risk if you frequently use your hands for work. This condition is also more likely to develop in:  ?? Musicians.  ?? Carpenters, painters, and plumbers.  ?? Cooks.  ?? Cashiers.  ?? People who work in factories.  ?? Construction workers.  ?? Butchers.  ?? People who use computers.  SYMPTOMS  Symptoms of this condition include:  ?? Pain and tenderness in your forearm and the outer part of your elbow. You may only feel the pain when you use your arm, or you may feel it even when you are not using your arm.  ?? A burning feeling that runs from your elbow through your arm.  ?? Weak grip in your hands.  DIAGNOSIS   This condition may be diagnosed by medical history and physical exam. You may also have other tests, including:  ?? X-rays.  ?? MRI.  TREATMENT  Your health care provider will recommend lifestyle adjustments, such as resting and icing your arm. Treatment may also include:  ?? Medicines for inflammation. This may include shots of cortisone if your pain continues.  ?? Physical therapy. This may include massage or exercises.  ?? An elbow brace.  Surgery may eventually be recommended if your pain does not go away with treatment.  HOME CARE  INSTRUCTIONS  Activity  ?? Rest your elbow and wrist as directed by your health care provider. Try to avoid any activities that caused the problem until your health care provider says that you can do them again.  ?? If a physical therapist teaches you exercises, do all of them as directed.  ?? If you lift an object, lift it with your palm facing upward. This lowers the stress on your elbow.  Lifestyle  ?? If your tennis elbow is caused by sports, check your equipment and make sure that:  ?? You are using it correctly.  ?? It is the best fit for you.  ?? If your tennis elbow is caused by work, take breaks frequently, if you are able. Talk with your manager about how to best perform tasks in a way that is safe.  ?? If your tennis elbow is caused by computer use, talk with your manager about any changes that can be made to your work environment.  General Instructions  ?? If directed, apply ice to the painful area:  ?? Put ice in a plastic bag.  ?? Place a towel between your skin and the bag.  ?? Leave the ice on for 20 minutes, 2-3 times per day.  ?? Take medicines only as directed by your health care provider.  ?? If you were given a brace, wear it as directed by your health care provider.  ??   Keep all follow-up visits as directed by your health care provider. This is important.  SEEK MEDICAL CARE IF:  ?? Your pain does not get better with treatment.  ?? Your pain gets worse.  ?? You have numbness or weakness in your forearm, hand, or fingers.     This information is not intended to replace advice given to you by your health care provider. Make sure you discuss any questions you have with your health care provider.     Document Released: 01/14/2005 Document Revised: 05/31/2014 Document Reviewed: 01/10/2014  Elsevier Interactive Patient Education ??2016 [employer]

## 2015-02-24 NOTE — Unmapped (Signed)
Pt presents to ER with increasing lower back pain, pt states that he has spine issues and has been being seen his primary Dr. and would like to go to pain management for further tx's, pt also states that he is having intermittent increased pain and sensitivity in his Right elbow, the pain is limiting his movement and affects the position he lays in bed. The pain is also affecting his ability to function at work

## 2015-02-24 NOTE — Unmapped (Addendum)
[facility] ED Note    Reason for Visit: Back Pain      Patient History     ZOX:WRUEAVW presents to the ED with chronic lower back pain requesting Percocet.  There is a pain is a little bit worse.  Patient also has a right elbow pain.  Denies any known trauma.  No fever chills reported.  No other complaints reported.  Past Medical History   Diagnosis Date   ??? Hypertension    ??? Diabetes mellitus    ??? COPD (chronic obstructive pulmonary disease)    ??? GERD (gastroesophageal reflux disease)    ??? Hepatitis        Past Surgical History   Procedure Laterality Date   ??? Hernia repair     ??? Foot surgery Left    ??? Esophagogastroduodenoscopy N/A 11/19/2013     Procedure: ESOPHAGOGASTRODUODENOSCOPY WITH MAC;  Surgeon: Charlane Ferretti, MD;  Location: [facility] ENDOSCOPY;  Service: Gastroenterology;  Laterality: N/A;       KELECHI ORGERON  reports that he has been smoking Cigarettes.  He has been smoking about 0.25 packs per day. He does not have any smokeless tobacco history on file. He reports that he does not drink alcohol or use illicit drugs.    Previous Medications    ALBUTEROL INHL    Inhale into the lungs.    AMLODIPINE (NORVASC) 10 MG TABLET    Take 10 mg by mouth daily    BLOOD SUGAR DIAGNOSTIC STRP    Test daily    BLOOD-GLUCOSE METER MISC    by Does not apply route.    BUPROPION SR (WELLBUTRIN SR) 100 MG TABLET    Take 100 mg by mouth 2 times daily Ran out of prescription    CLONIDINE (CATAPRES) 0.2 MG/24 HR PATCH    Place 1 patch onto the skin once a week.    ENTECAVIR (BARACLUDE) 0.5 MG TABLET    Take 1 tablet (0.5 mg total) by mouth daily.    FLUOXETINE (PROZAC) 20 MG CAPSULE        KETOCONAZOLE (NIZORAL) 2 % CREAM        LORATADINE 10 MG CAP    Take 1 tablet by mouth daily    LOSARTAN (COZAAR) 100 MG TABLET    Take 100 mg by mouth daily    METFORMIN (GLUCOPHAGE) 500 MG TABLET    Take 500 mg by mouth 2 times a day with meals.    ONDANSETRON (ZOFRAN ODT) 4 MG  DISINTEGRATING TABLET    Take 1 tablet (4 mg total) by mouth every 6 hours as needed for Nausea.    OXYCODONE (ROXICODONE) 5 MG IMMEDIATE RELEASE TABLET    Take 1 tablet (5 mg total) by mouth every 4 hours as needed for Pain.    OXYCODONE-ACETAMINOPHEN (PERCOCET) 10-325 MG PER TABLET    1 tablet every 4 hours as needed.       TAMSULOSIN (FLOMAX) 0.4 MG CP24    Take 0.4 mg by mouth daily    TRAZODONE (DESYREL) 100 MG TABLET           Allergies:   Allergies as of 02/24/2015 - Fully Reviewed 11/16/2014   Allergen Reaction Noted   ??? Penicillins Hives 04/22/2013       Review of Systems     ROS:Has no other complaints. Other review of systems are negative as reviewed and reported by patient.      Physical Exam     ED Triage Vitals  Vital Signs Group      Temp 02/24/15 1618 97.8 ??F (36.6 ??C)      Temp Source 02/24/15 1618 Oral      Heart Rate 02/24/15 1618 101      Heart Rate Source 02/24/15 1618 Monitor;Automatic      Resp 02/24/15 1618 16      SpO2 02/24/15 1618 96 %      BP 02/24/15 1618 154/96 mmHg      BP Location 02/24/15 1618 Left arm      BP Method 02/24/15 1618 Automatic      Patient Position 02/24/15 1618 Sitting   SpO2 02/24/15 1618 96 %   O2 Device 02/24/15 1618 None (Room air)       General: No acute distress.    HEENT:  PERRL    Neck:  Supple, full range of motion, trachea midline.    Musculoskeletal:  FROM. Cap refill <2s. Neurovascular intact. Pain palpation over lateral epicondyle.  No erythema warmth or soft tissue swelling.  Full range of motion.  Radial ulnar and median nerve are all intact as is axillary nerve.No midline pain palpation of thoracic and lumbar spine.  Mild paralumbar pain palpation without erythema warmth or soft tissue swelling.  Vascular: Radial pulses symmetrical.    Skin:  No acute rashes noted.    Neuro:  Alert and oriented x 3. Motor 5/5 throughout. Sensory intact to touch. No dysmetria.    Psych:  Oriented. Able to give history.      Diagnostic Studies         Emergency  Department Procedures         ED Course and MDM     AVARI GELLES is a 60 y.o. male who presented to the emergency department with Back Pain      1st chronic back pain he'll be prescribed Norflex one by mouth twice a day number 10 as needed.  Motrin 400 mg by mouth every 6 hours as needed.  Limited supply prescribed.  Follow-up with family doctor clinic back discussed with her pain medications.  Ace wrap for 48 hours.  Ice to the lateral aspect of elbow.  Was likely lateral epicondylitis on exam.      Critical Care Time (Attendings)         Arville Go, MD  02/24/15 1630    Arville Go, MD  02/24/15 1630

## 2015-03-22 ENCOUNTER — Encounter: Payer: MEDICAID | Attending: Gastroenterology

## 2015-05-08 NOTE — Unmapped (Signed)
Received faxe approval for Entecavir 30 days for 6 months. Faxed approval to [facility] @ [phone]. Also sent copy to scan.

## 2015-06-08 ENCOUNTER — Inpatient Hospital Stay: Admit: 2015-06-08 | Payer: MEDICAID

## 2015-06-08 DIAGNOSIS — M47817 Spondylosis without myelopathy or radiculopathy, lumbosacral region: Secondary | ICD-10-CM

## 2015-07-17 ENCOUNTER — Other Ambulatory Visit: Admit: 2015-07-17 | Payer: MEDICAID

## 2015-07-17 DIAGNOSIS — B1920 Unspecified viral hepatitis C without hepatic coma: Secondary | ICD-10-CM

## 2015-07-17 LAB — HEPATITIS B CORE ANTIBODY: Hep B Core Total Ab: REACTIVE

## 2015-07-17 LAB — HEPATITIS C RNA, QUANTITATIVE PCR: International Units: NOT DETECTED IU/mL

## 2015-07-28 ENCOUNTER — Inpatient Hospital Stay
Admission: EM | Admit: 2015-07-28 | Discharge: 2015-07-31 | Disposition: A | Discharge status: Home / Self Care | Payer: MEDICAID | Admitting: Neurology

## 2015-07-28 ENCOUNTER — Inpatient Hospital Stay: Payer: MEDICAID

## 2015-07-28 ENCOUNTER — Inpatient Hospital Stay: Admit: 2015-07-28 | Payer: MEDICAID

## 2015-07-28 ENCOUNTER — Emergency Department: Admit: 2015-07-28 | Payer: MEDICAID

## 2015-07-28 DIAGNOSIS — I63512 Cerebral infarction due to unspecified occlusion or stenosis of left middle cerebral artery: Principal | ICD-10-CM

## 2015-07-28 LAB — COMPREHENSIVE METABOLIC PANEL
ALT: 11 U/L (ref 7–52)
AST: 18 U/L (ref 13–39)
Albumin: 4.3 g/dL (ref 3.5–5.7)
Alkaline Phosphatase: 87 U/L (ref 36–125)
Anion Gap: 10 mmol/L (ref 3–16)
BUN: 14 mg/dL (ref 7–25)
CO2: 25 mmol/L (ref 21–33)
Calcium: 9.4 mg/dL (ref 8.6–10.3)
Chloride: 103 mmol/L (ref 98–110)
Creatinine: 1.04 mg/dL (ref 0.60–1.30)
Glucose: 97 mg/dL (ref 70–100)
Osmolality, Calculated: 286 mosm/kg (ref 278–305)
Potassium: 4.3 mmol/L (ref 3.5–5.3)
Sodium: 138 mmol/L (ref 133–146)
Total Bilirubin: 1.3 mg/dL (ref 0.0–1.5)
Total Protein: 7.5 g/dL (ref 6.4–8.9)
eGFR AA CKD-EPI: >90 See note.
eGFR NONAA CKD-EPI: 78 See note.

## 2015-07-28 LAB — CBC
Hematocrit: 44.9 % (ref 38.5–50.0)
Hematocrit: 47.9 % (ref 38.5–50.0)
Hemoglobin: 15.2 g/dL (ref 13.2–17.1)
Hemoglobin: 16.1 g/dL (ref 13.2–17.1)
MCH: 31.7 pg (ref 27.0–33.0)
MCH: 31.6 pg (ref 27.0–33.0)
MCHC: 33.8 g/dL (ref 32.0–36.0)
MCHC: 33.7 g/dL (ref 32.0–36.0)
MCV: 93.6 fL (ref 80.0–100.0)
MCV: 93.9 fL (ref 80.0–100.0)
MPV: 9.5 fL (ref 7.5–11.5)
MPV: 9.3 fL (ref 7.5–11.5)
Platelets: 140 10*3/uL (ref 140–400)
Platelets: 148 10E3/uL (ref 140–400)
RBC: 4.79 10*6/uL (ref 4.20–5.80)
RBC: 5.1 10E6/uL (ref 4.20–5.80)
RDW: 13.7 % (ref 11.0–15.0)
RDW: 14 % (ref 11.0–15.0)
WBC: 6.3 10*3/uL (ref 3.8–10.8)
WBC: 8 10E3/uL (ref 3.8–10.8)

## 2015-07-28 LAB — BASIC METABOLIC PANEL
Anion Gap: 7 mmol/L (ref 3–16)
BUN: 15 mg/dL (ref 7–25)
CO2: 24 mmol/L (ref 21–33)
Calcium: 8.9 mg/dL (ref 8.6–10.3)
Chloride: 104 mmol/L (ref 98–110)
Creatinine: 1.04 mg/dL (ref 0.60–1.30)
Glucose: 105 mg/dL — ABNORMAL HIGH (ref 70–100)
Osmolality, Calculated: 281 mosm/kg (ref 278–305)
Potassium: 3.9 mmol/L (ref 3.5–5.3)
Sodium: 135 mmol/L (ref 133–146)
eGFR AA CKD-EPI: >90 See note.
eGFR NONAA CKD-EPI: 78 See note.

## 2015-07-28 LAB — POC GLU MONITORING DEVICE: POC Glucose Monitoring Device: 99 mg/dL (ref 70–100)

## 2015-07-28 LAB — TROPONIN I
Troponin I: <0.04 ng/mL (ref 0.00–0.03)
Troponin I: <0.04 ng/mL (ref 0.00–0.03)

## 2015-07-28 LAB — URINALYSIS W/RFL TO MICROSCOPIC
Bilirubin, UA: NEGATIVE
Glucose, UA: NEGATIVE mg/dL
Ketones, UA: 5 mg/dL
Leukocytes, UA: NEGATIVE
Nitrite, UA: NEGATIVE
Protein, UA: NEGATIVE mg/dL
RBC, UA: 7 /HPF (ref 0–3)
Specific Gravity, UA: 1.023 (ref 1.005–1.035)
Squam Epithel, UA: <1 /HPF (ref 0–5)
Urobilinogen, UA: <2 mg/dL (ref 0.2–1.9)
WBC, UA: 1 /HPF (ref 0–5)
pH, UA: 7 (ref 5.0–8.0)

## 2015-07-28 LAB — B NATRIURETIC PEPTIDE: BNP: 78 pg/mL (ref 0–100)

## 2015-07-28 LAB — GLUCOSE, RANDOM: Glucose: 97 mg/dL (ref 70–100)

## 2015-07-28 LAB — HEMOGLOBIN A1C: Hemoglobin A1C: 5.7 % (ref 4.8–6.4)

## 2015-07-28 LAB — APTT: aPTT: 30.5 s (ref 25.5–35.0)

## 2015-07-28 LAB — PROTIME-INR
INR: 1.1 (ref 0.9–1.1)
Protime: 13.9 seconds (ref 11.6–14.4)

## 2015-07-28 MED ORDER — sodium chloride 0.9 % 500 mL bolus
Freq: Once | INTRAVENOUS | Status: AC
Start: 2015-07-28 — End: 2015-07-28
  Administered 2015-07-28: 22:00:00 via INTRAVENOUS

## 2015-07-28 MED ORDER — sodium chloride 0.9 % infusion
INTRAVENOUS | Status: AC
Start: 2015-07-28 — End: 2015-07-30
  Administered 2015-07-28: 20:00:00 75 mL/h via INTRAVENOUS
  Administered 2015-07-29: 02:00:00 100 mL/h via INTRAVENOUS

## 2015-07-28 MED ORDER — ALTEPLASE IV BOLUS bolus 7.3 mg
Freq: Once | Status: AC
Start: 2015-07-28 — End: 2015-07-28
  Administered 2015-07-28: 17:00:00 7.3 mg/kg via INTRAVENOUS

## 2015-07-28 MED ORDER — niCARdipine (CARDENE) 50 mg in sodium chloride 0.9 % 250 mL infusion
25 | INTRAVENOUS | Status: AC
Start: 2015-07-28 — End: 2015-07-29
  Administered 2015-07-28: 18:00:00 5 mg/h via INTRAVENOUS

## 2015-07-28 MED ORDER — insulin regular (HumuLIN R) injection Soln 0-12 Units
100 | Freq: Four times a day (QID) | INTRAMUSCULAR | Status: AC
Start: 2015-07-28 — End: 2015-07-31

## 2015-07-28 MED ORDER — albuterol (PROVENTIL;VENTOLIN;PROAIR) 90 mcg/actuation inhaler
90 | RESPIRATORY_TRACT | Status: AC
Start: 2015-07-28 — End: 2015-07-29

## 2015-07-28 MED ORDER — labetalol (NORMODYNE,TRANDATE) 5 mg/mL injection
5 | INTRAVENOUS | Status: AC
Start: 2015-07-28 — End: ?

## 2015-07-28 MED ORDER — OMNIPAQUE (iohexol) 350 mg iodine/mL 100 mL
350 | Freq: Once | INTRAVENOUS | Status: AC | PRN
Start: 2015-07-28 — End: 2015-07-28
  Administered 2015-07-28: 17:00:00 100 mL via INTRAVENOUS

## 2015-07-28 MED ORDER — dextrose 50 % in water (D50W) iv Syrg 25-50 mL
INTRAVENOUS | Status: AC | PRN
Start: 2015-07-28 — End: 2015-07-31

## 2015-07-28 MED ORDER — valsartan (DIOVAN) tablet 160 mg
160 | Freq: Every day | ORAL | Status: AC
Start: 2015-07-28 — End: 2015-07-28

## 2015-07-28 MED ORDER — alteplase 1mg/mL in sterile water (ACTIVASE) infusion 65.6 mg
100 | Freq: Once | INTRAVENOUS | Status: AC
Start: 2015-07-28 — End: 2015-07-28
  Administered 2015-07-28: 17:00:00 65.6 mg/kg via INTRAVENOUS

## 2015-07-28 MED ORDER — amLODIPine (NORVASC) tablet 10 mg
10 | Freq: Every day | ORAL | Status: AC
Start: 2015-07-28 — End: 2015-07-28

## 2015-07-28 MED ORDER — labetalol (NORMODYNE,TRANDATE) injection 20 mg
5 | Freq: Once | INTRAVENOUS | Status: AC
Start: 2015-07-28 — End: 2015-07-28
  Administered 2015-07-28: 17:00:00 10 mg via INTRAVENOUS

## 2015-07-28 MED ORDER — potassium chloride (KCl)/Sterile water 100 mL IVPB 10 mEq
10 | INTRAVENOUS | Status: AC
Start: 2015-07-28 — End: 2015-07-28
  Administered 2015-07-28 – 2015-07-29 (×2): 10 meq via INTRAVENOUS

## 2015-07-28 MED ORDER — glucose chewable tablet 12 g
4 | ORAL | Status: AC | PRN
Start: 2015-07-28 — End: 2015-07-31

## 2015-07-28 MED ORDER — atorvastatin (LIPITOR) tablet 80 mg
80 | Freq: Every evening | ORAL | Status: AC
Start: 2015-07-28 — End: 2015-07-31
  Administered 2015-07-30 – 2015-07-31 (×2): 80 mg via ORAL

## 2015-07-28 MED ORDER — albuterol (PROVENTIL;VENTOLIN;PROAIR) inhaler 2 puff
90 | Freq: Four times a day (QID) | RESPIRATORY_TRACT | Status: AC | PRN
Start: 2015-07-28 — End: 2015-07-31
  Administered 2015-07-28: 21:00:00 2 via RESPIRATORY_TRACT

## 2015-07-28 MED FILL — LABETALOL 5 MG/ML INTRAVENOUS SOLUTION: 5 5 mg/mL | INTRAVENOUS | Qty: 20

## 2015-07-28 MED FILL — POTASSIUM CHLORIDE 10 MEQ/100ML IN STERILE WATER INTRAVENOUS PIGGYBACK: 10 10 mEq/100 mL | INTRAVENOUS | Qty: 200

## 2015-07-28 MED FILL — VENTOLIN HFA 90 MCG/ACTUATION AEROSOL INHALER: 90 90 mcg/actuation | RESPIRATORY_TRACT | Qty: 8

## 2015-07-28 MED FILL — OMNIPAQUE 350 MG IODINE/ML INTRAVENOUS SOLUTION: 350 350 mg iodine/mL | INTRAVENOUS | Qty: 100

## 2015-07-28 MED FILL — NICARDIPINE 25 MG/10 ML INTRAVENOUS SOLUTION: 25 25 mg/10 mL | INTRAVENOUS | Qty: 50

## 2015-07-28 MED FILL — ACTIVASE 100 MG INTRAVENOUS SOLUTION: 100 100 mg | INTRAVENOUS | Qty: 65.6

## 2015-07-28 MED FILL — ACTIVASE 100 MG INTRAVENOUS SOLUTION: 100 100 mg | INTRAVENOUS | Qty: 7.3

## 2015-07-28 NOTE — Unmapped (Addendum)
Admitted to NSICU 12 & oriented to room. Initial exam NIHSS 0. But exam fluctuated, with right facial/arm/leg paresis & plegia & dysarthria severe at times & drowsiness. These deteriorations do not seem to be pressure related as exam good even with SBP 150s-160s. Neuro evals frequent. ICU team aware & assessing. HOB flat & IVF bolus given as rx. Cardene started for rx goal SBP < 180  then weaned off. AVSS. See flowsheet, records & education record for details.

## 2015-07-28 NOTE — Unmapped (Signed)
[facility] ED Note    Date of service:  07/28/2015    Reason for Visit: Stroke Alert      Patient History     HPI:  [name] is a 60 y.o. male with PMH of HTN, NIDDM who presents with R sided weakness and dysarthria. Patient had a witnessed onset of these symptoms at 1130am by his girlfriend. He was brought in by EMS as a stroke alert. No headache. No visual complaints. He is unable to move his R arm. He can move his R leg but not off the bed. No other associated, aggravating, or alleviating factors.    Past Medical History   Diagnosis Date   ??? Hypertension    ??? Diabetes mellitus    ??? COPD (chronic obstructive pulmonary disease)    ??? GERD (gastroesophageal reflux disease)    ??? Hepatitis        Past Surgical History   Procedure Laterality Date   ??? Foot surgery Left    ??? Esophagogastroduodenoscopy N/A 11/19/2013     Procedure: ESOPHAGOGASTRODUODENOSCOPY WITH MAC;  Surgeon: Charlane Ferretti, MD;  Location: [facility] ENDOSCOPY;  Service: Gastroenterology;  Laterality: N/A;   ??? Hernia repair       about 5 yrs ago       [name]  reports that he has been smoking Cigarettes.  He has a 1.25 pack-year smoking history. He does not have any smokeless tobacco history on file. He reports that he does not drink alcohol or use illicit drugs.    Previous Medications    ALBUTEROL INHL    Inhale into the lungs.    AMLODIPINE (NORVASC) 10 MG TABLET    Take 10 mg by mouth daily    BLOOD SUGAR DIAGNOSTIC STRP    Test daily    BLOOD-GLUCOSE METER MISC    by Does not apply route.    BUPROPION SR (WELLBUTRIN SR) 100 MG TABLET    Take 100 mg by mouth 2 times daily Ran out of prescription    CLONIDINE (CATAPRES) 0.2 MG/24 HR PATCH    Place 1 patch onto the skin once a week.    ENTECAVIR (BARACLUDE) 0.5 MG TABLET    Take 1 tablet (0.5 mg total) by mouth daily.    FLUOXETINE (PROZAC) 20 MG CAPSULE        IBUPROFEN (ADVIL,MOTRIN) 800 MG TABLET    Take 0.5 tablets (400 mg total) by  mouth every 6 hours as needed for up to 16 doses.    KETOCONAZOLE (NIZORAL) 2 % CREAM        LORATADINE 10 MG CAP    Take 1 tablet by mouth daily    LOSARTAN (COZAAR) 100 MG TABLET    Take 100 mg by mouth daily    METFORMIN (GLUCOPHAGE) 500 MG TABLET    Take 500 mg by mouth 2 times a day with meals.    ONDANSETRON (ZOFRAN ODT) 4 MG DISINTEGRATING TABLET    Take 1 tablet (4 mg total) by mouth every 6 hours as needed for Nausea.    ORPHENADRINE (NORFLEX) 100 MG TABLET    Take 1 tablet (100 mg total) by mouth 2 times a day.    OXYCODONE (ROXICODONE) 5 MG IMMEDIATE RELEASE TABLET    Take 1 tablet (5 mg total) by mouth every 4 hours as needed for Pain.    OXYCODONE-ACETAMINOPHEN (PERCOCET) 10-325 MG PER TABLET    1 tablet every 4 hours as needed.  TAMSULOSIN (FLOMAX) 0.4 MG CP24    Take 0.4 mg by mouth daily    TRAZODONE (DESYREL) 100 MG TABLET           Allergies:   Allergies as of 07/28/2015 - Fully Reviewed 07/28/2015   Allergen Reaction Noted   ??? Penicillins Hives 04/22/2013       Review of Systems     ROS: A 10 point ROS was reviewed and negative except per HPI and bolded items below:  Fevers, sweats, chest pain, palpitations, syncope, dyspnea, cough, abdominal pain, nausea, vomiting, diarrhea, dysuria, frequency, discharge, rashes, headache, numbness, weakness, no other complaints today     Physical Exam     Please see SR U documentation for vital signs    General:  This is a well developed, well nourished male in no acute distress who appears their stated age.    HEENT:  NC/AT, PERRL, OP clear, uvula/tongue midline, MMM    Neck:  Supple.  Trachea midline.    Pulmonary:  Good effort, no accessory muscle use.  CTAB without W/R/R    Cardiac:  RRR  no M/R/G    Abdomen:  S/NT/ND, no rebound, rigidity, or guarding.    Musculoskeletal:  No deformities noted, baseline ROM, no TTP    Extremities: W/WP no C/C/E    Vascular:  palpable and symmetric radial/DP pulses.    Skin:  No rashes or lesions noted, non-icteric      Neuro exam:   - Mental status: alert, oriented to person, place, time, and event   - Language: receptive and expressive language in tact: no aphasia, moderate dysarthria    - Motor: 0/5 strenght R arm, 3/5 strength R leg     - No pronator drift noted;     - Cerebellar: finger to nose. RAM normal.   - Sensory: decreased sensation to light tough RUE   - Cranial Nerves: EOMI, PERRLA, palate elevates symmetrically with no uvular deviation, hearing intact and symmetric, SILT in V1-V3 distribution, Hemiplegia of the R lower face, tongue midline,    NIH Stroke Scale      Interval: Baseline  Time: 2:56 PM  Person Administering Scale: Vona Whiters CHARLES Ashika Apuzzo    1a  Level of consciousness: 0=alert; keenly responsive   1b. LOC questions:  0=Performs both tasks correctly   1c. LOC commands: 0=Performs both tasks correctly   2.  Best Gaze: 0=normal   3.  Visual: 0=No visual loss   4. Facial Palsy: 2=Partial paralysis (total or near total paralysis of the lower face)   5a.  Motor left arm: 0=No drift, limb holds 90 (or 45) degrees for full 10 seconds   5b.  Motor right arm: 4=No movement   6a. motor left leg: 0=No drift, limb holds 90 (or 45) degrees for full 10 seconds   6b  Motor right leg:  3=No effort against gravity, limb falls   7. Limb Ataxia: 0=Absent   8.  Sensory: 1=Mild to moderate sensory loss; patient feels pinprick is less sharp or is dull on the affected side; there is a loss of superficial pain with pinprick but the patient is aware of being touched   9. Best Language:  0=No aphasia, normal   10. Dysarthria: 1=Mild to moderate, patient slurs at least some words and at worst, can be understood with some difficulty   11. Extinction and Inattention: 0=No abnormality   12. Distal motor function: 0=Normal    Total:   12  Diagnostic Studies     Labs:    Labs Reviewed   BASIC METABOLIC PANEL - Abnormal; Notable for the following:     Glucose 105 (*)     All other components within normal limits    Narrative:      As of 03/29/2014 the estimated GFR is calculated from serum creatinine using the Chronic Kidney Disease  Epidemiology Collaboration (CKD-EPI) equation in patients 18 years and older.  The reference range is   >60 mL/min/1.60m2.  eGFR values greater than 90 will be reported as >13mL/min/1.73m2.  Reference: Cherie Dark AS, Suella Grove [facility], Stefano Gaul AF, 3rd, Feldman HI, et. al.  A new equation to estimate glomerular filtration rate.  Ann Intern Med. 2009:150(9):604-12   B NATRIURETIC PEPTIDE   CBC   PROTIME-INR   TROPONIN I   APTT       Radiology:    X-ray chest AP portable   Final Result   IMPRESSION:      Clear lungs.      Report Verified by: Odie Sera, M.D. at 07/28/2015 2:09 PM EDT      Code Stroke-CT Head WO contrast   Final Result   IMPRESSION:       1.  Hyperdense vessel sign suspicious for thrombus within a left M3 branch.    2.  No acute hemorrhage or mass effect.      Aspect score: 10      Critical Value: Findings concerning for acute thrombus in a left M3 branch vessel  This finding was discussed with Newton Pigg on 07/28/2015 at 1340 hours by Harsha Nalluri.  They confirmed that they understood the findings communicated to them.  #[id number]#      Approved by Wilmer Floor on 07/28/2015 1:48 PM EDT      I have personally reviewed the images and I agree with this report.      Report Verified by: J. Laurita Quint, M.D. at 07/28/2015 1:59 PM EDT      CT Angio Head W and or WO   Preliminary Result      CT Angio Neck W and or WO    (Results Pending)       EKG:        Emergency Department Procedures     none    ED Course     ED Medications Administered:  Medications   niCARdipine (CARDENE) 50 mg in sodium chloride 0.9 % 250 mL infusion (0 mg/hr Intravenous Paused 07/28/15 1452)   labetalol (NORMODYNE,TRANDATE) injection 20 mg (10 mg Intravenous Given 07/28/15 1319)   OMNIPAQUE (iohexol) 350 mg iodine/mL 100 mL (100 mLs Intravenous Given 07/28/15 1311)   ALTEPLASE IV BOLUS bolus 7.3 mg (7.3 mg  Intravenous Given 07/28/15 1326)     Followed by   alteplase 1mg /mL in sterile water (ACTIVASE) infusion 65.6 mg (65.6 mg Intravenous New Bag 07/28/15 1327)       CONSULTS:   ED CONTACT PROVIDER [facility]    MEDICAL DECISION MAKING     [name] is a 60 y.o. male who presented to the emergency department with Stroke Alert    The patient was evaluated by myself as well as the ED attending physician. Nursing notes, vital signs, PMH, Social and Family Hx reviewed.     60 year old male who presented with right-sided weakness and dysarthria consistent with an acute ischemic stroke.  Patient had witnessed onset of his symptoms of 11:30 AM so he revived well within the TPA window.  He was transported immediately to CT scanner where a noncontrast the head not show any intracranial hemorrhage this was confirmed verbally over the phone by the attending radiologist.  We also obtained a CT and her grandmother had a neck which was not obviously abnormal.  His blood pressure was hypertensive to 180s over 120 so this was controlled with 10 mg of IV labetalol patient's prehospital blood sugar was within normal limits.  Risk-benefit discussion was had with the patient and was the son who opted for immunization of TPA.  Patient had no absolute contraindications to administer admission of TPA.  Patient's NIHSS was 12, his chest x-ray EKG and stroke workup were otherwise unremarkable.    The stroke physician is Dr. Newton Pigg was working clinically the emergency department today and was gracious enough to involve herself in this patient's care and agreed with the plan to administer TPA.  The patient was given TPA's door to needle time was 27 minutes.    Following administration of TPA the patient gradually had an improvement in his neurological function began to regain movement in his right upper extremity and improvement in his facial droop.    The patient was started on nicardipine drip to control his blood pressure.  I consulted  no current local care who agreed to admit the patient to the SICU.    At this time it was determined this patient warranted admission to the hospital. The admitting team was contacted, and they informed us to this patient's stay here in the emergency department, and his workup here. The patient was informed as to our recommendations for admission, and agrees to them. Please see the admitting team's dictations and notes for further treatment and course for this patient's hospital stay.     This patient was also evaluated by the attending physician. All care plans were discussed and agreed upon.    Clinical Impression     1. Acute ischemic stroke        Disposition/Plan     PATIENT REFERRED TO:  No follow-up provider specified.    DISCHARGE MEDICATIONS:  New Prescriptions    No medications on file               [name], MD  Resident  07/28/15 [phone]

## 2015-07-28 NOTE — Unmapped (Signed)
Neuroscience ICU History and Physical/Consult Note    Neurocritical Care Attending: Vickii Penna  House Officer: Blaine Hamper  Primary Care Physician: Hendricks Limes DORIOTT, DO  Date of Admission: 07/28/2015    CC:   R sided weakness    HPI:   Mr [name] is a 60 yo male with PMH HTN, DM, COPD, GERD, chronic back pain presenting to ED for evaluation of R sided weakness with last known normal at 1130 today.  Pt girlfriend states she checked on him at 1130 and he was down stairs watching TV and acting appropriately.  Girlfriend then checked on him again at 1215 and found him with slurred speech and unable to move his RUE/RLE. EMS was called. Pt [facility] at [facility] with RUE/RLE weakness. Pt received tPA started in ED and nicardipine gtt started for BP control, hypertensive with SBP 180s on ED arrival. Pt was then transferred to NSICU.     Pt states he is compliant with his home medications. Denies drug use. Smokes 10 cigarettes per day. States she was sitting on the couch when he noticed he could not move his RUE/RLE and could not speak. Pt states no that he can intermittently move his RUE and RLE very well though his speech remains slurred. Denies HA, CP, SOB, abd pain.     ROS:  Patient reports no health concerns.    PMH:  Past Medical History   Diagnosis Date   ??? Hypertension    ??? Diabetes mellitus    ??? COPD (chronic obstructive pulmonary disease)    ??? GERD (gastroesophageal reflux disease)    ??? Hepatitis        Medications:    (Not in a hospital admission)    Allergies:  Allergies   Allergen Reactions   ??? Penicillins Hives       SH:  Social History     Social History   ??? Marital Status: Single     Spouse Name: N/A   ??? Number of Children: N/A   ??? Years of Education: N/A     Occupational History   ??? Not on file.     Social History Main Topics   ??? Smoking status: Current Every Day Smoker -- 0.25 packs/day for 5 years     Types: Cigarettes   ??? Smokeless tobacco: Not on file   ??? Alcohol Use: No   ??? Drug Use: No   ??? Sexual  Activity:     Partners: Female     Pharmacist, hospital Protection: Condom     Other Topics Concern   ??? Not on file     Social History Narrative       FH:  No family history on file.    PE:  Filed Vitals:    07/28/15 1422 07/28/15 1427 07/28/15 1432 07/28/15 1437   BP: 164/95 164/101 165/108 185/130   Pulse: 81 80 83 82   Resp: 18 11 18 18    Weight:       SpO2: 100% 99% 100% 100%       General appearance - alert, well appearing, and in no distress  Chest - clear to auscultation, no wheezes, rales or rhonchi, symmetric air entry  Heart - normal rate, regular rhythm, normal S1, S2, no murmurs, rubs, clicks or gallops  Abdomen - soft, nontender, nondistended, no masses or organomegaly  Extremities - peripheral pulses normal, no pedal edema, no clubbing or cyanosis  Neurological -  Mental status - alert, oriented to person, place, and  time, normal mood, behavior, speech, dress, motor activity, and thought processes  Cranial Nerves: pupils 3mm bilaterally and briskly reactive, EOM intact, facial sensation decreased on R, facial expression reveals mild facial droop on R with slurring of speech  Motor: Strength   4/5 strength grip and tricep extension/wrist extension on R; 5/5 strength on LUE and LLE; 4+/5 strength RLE  Sensory:   Sensation decreased RLE/RUE  DTR: patellar intact    Testing:    Chem      Lab 07/28/15  1323   SODIUM 135   POTASSIUM 3.9   CHLORIDE 104   CO2 24   BUN 15   CREATININE 1.04   GLUCOSE 105*   CALCIUM 8.9        Heme  Recent Labs      07/28/15   1323   WBC  6.3   HGB  15.2   HCT  44.9   PLT  140       Coags       Lab 07/28/15  1323   HEMOGLOBIN 15.2   HEMATOCRIT 44.9   PLATELETS 140   PROTHROMBIN TIME 13.9   INR 1.1       Other                   Invalid input(s): CO2ART, HBO2PE    Imaging  CT Head  Findings:  ??  The ventricles are normal in size and configuration. There is a hyperdense vessel in the region of the left insular cistern (series 2 image 17). Preliminary review of the CTA axial maps  demonstrates decreased contrast opacification within a vessel in this location, likely a M3 branch.?? There is no evidence of acute hemorrhage or mass effect. No extra-axial fluid is seen. The paranasal sinuses are clear. The globes and orbits are unremarkable. ??  ??  IMPRESSION: ??  1.?? Hyperdense vessel sign suspicious for thrombus within a left M3 branch. ??  2.?? No acute hemorrhage or mass effect.  Aspect score: 10      CTA Head/Neck  IMPRESSION:  ??  CTA Head:  Decreased contrast opacification within a left M3 branch vessel. No major vessel occlusion. ??  ??  CTA Neck:  1.?? No major vessel occlusion.  2.?? Nonspecific 1 cm lobular low density lesion in the right parotid gland.  3.?? Emphysema. ??      Impression/Plan:    Neuro: Acute ischemic stroke  R face/UE/LE weakness and slurred speech with neg head CT in ED  -tPA finished   -nicardipine gtt with goal SBP < 180  -repeat head CT at 1500 on 07/29/15  -MRI and MRI head/neck   -if pt has worsening neuro exam put head down and give IVF as lesion may be pressure dependent, if exam not improved with these measures consider repeat CT imaging  -TTE, LE dopplers ordered    Respiratory: COPD  -continue home albuterol BID PRN      Cardiovascular: Hypertension, unspecified  -hypertensive 180s systolic in ED requiring nicardipine gtt  -EKG, trop, BNP WNL in ED and no complaints of acute chest pain  -continue nicardipine gtt with goal SBP < 180  -restart home BP meds amlodipine 10mg  daily and losartan 100mg  daily      Fluids/Electrolytes: No acute issues  -mIVF NS at 100cc/hr    Renal: No acute issues  -no interventions needed  -daily BMP    GI: No acute issues  -no interventions needed  -can give H2Blocker if develops GERD  sx  -hx hepatitis    Nutrition: No acute issues  -NPO currently  -SLP eval and dysphagia screen pending    Infectious Disease: No acute issues  -Afebrile with no leukocytosis on admission    Hematologic: No acute issues  -daily CBC    Endocrine: DM, type II,  uncontrolled   -takes metformin at home intermittently   -start SSI    Sedation/Pain Management/Psychiatric: No acute issues  -has home chronic back pain  -if pt c/o pain consider fentanyl in lieu of more sedating opiates    Lines/Tubes:  PIV    Injury/Disease Specific Needs:  Last Lower Ext. Duplex: ordered  DVT Prophylaxis: SCDs  GI Prophylaxis:none    Disposition: Remain in ICU    Blaine Hamper  Neurocritical Care  07/28/2015  2:51 PM

## 2015-07-28 NOTE — Unmapped (Signed)
Pt with right sided weakness and slurred speech starting at 11:30 while he was talking to his girlfriend.

## 2015-07-28 NOTE — Unmapped (Signed)
STROKE TEAM CONSULT NOTE    [name]  07/28/2015  1:30 PM    Patient last seen normal at: Date:0630/2017  1130  Patient Arrived to ED at:1300  Stroke Team contacted at: (already in ED)  Arrival of team to ED: already in ED  Review of head CT by me or radiology:1307    History of Present Illness:  [name] is a 60 y.o. male who presents with right side weakness.  Last seen normal by SO at 1130.  Cannot move right side.  Speech slurred.  Comprehension intact.  No recent trauma.  No recent illness or hospitalizations.  No other complaints.    BP Readings from Last 1 Encounters:   07/28/15 189/110     No results found for: GLU1HR    I discussed the risks and benefits of tPA with Patient/Family and the decision was made to proceed with tPA administration. Based on patient weight of 81 and tPA dosing of 0.90 mg/kg (max of 90 mg), total tPA dose was calculated to be 72.9mg . The 7.3 mg bolus was administered by Me. The remaining 65.6 mg was then infused over 1 hour.  Time of t-PA bolus:1326  Door to needle time:26 minutes    Clinical Trials:  The patient was not enrolled in a clinical trial    Medical History:  Past Medical History   Diagnosis Date   ??? Hypertension    ??? Diabetes mellitus    ??? COPD (chronic obstructive pulmonary disease)    ??? GERD (gastroesophageal reflux disease)    ??? Hepatitis      Past Surgical History   Procedure Laterality Date   ??? Foot surgery Left    ??? Esophagogastroduodenoscopy N/A 11/19/2013     Procedure: ESOPHAGOGASTRODUODENOSCOPY WITH MAC;  Surgeon: Charlane Ferretti, MD;  Location: [facility] ENDOSCOPY;  Service: Gastroenterology;  Laterality: N/A;   ??? Hernia repair       about 5 yrs ago     Social History     Social History   ??? Marital Status: Single     Spouse Name: N/A   ??? Number of Children: N/A   ??? Years of Education: N/A     Social History Main Topics   ??? Smoking status: Current Every Day Smoker -- 0.25 packs/day for 5 years     Types: Cigarettes   ??? Smokeless tobacco: Not on file    ??? Alcohol Use: No   ??? Drug Use: No   ??? Sexual Activity:     Partners: Female     Pharmacist, hospital Protection: Condom     Other Topics Concern   ??? Not on file     Social History Narrative     No family history on file.  Current Facility-Administered Medications   Medication Dose Frequency Provider Last Dose   ??? alteplase 1mg /mL in sterile water  0.81 mg/kg Once [name], MD 65.6 mg at 07/28/15 1327   ??? niCARdipene (CARDENE) infusion 50 mg in 250 mL  5-15 mg/hr Continuous [name], MD       Current Outpatient Prescriptions   Medication Sig   ??? ALBUTEROL INHL Inhale into the lungs.   ??? amLODIPine Take 10 mg by mouth daily   ??? blood sugar diagnostic Test daily   ??? blood-glucose meter by Does not apply route.   ??? buPROPion HCl Take 100 mg by mouth 2 times daily Ran out of prescription   ??? cloNIDine Place 1 patch onto  the skin once a week.   ??? entecavir Take 1 tablet (0.5 mg total) by mouth daily.   ??? FLUoxetine    ??? ibuprofen Take 0.5 tablets (400 mg total) by mouth every 6 hours as needed for up to 16 doses.   ??? ketoconazole    ??? loratadine Take 1 tablet by mouth daily   ??? losartan Take 100 mg by mouth daily   ??? metFORMIN Take 500 mg by mouth 2 times a day with meals.   ??? ondansetron Take 1 tablet (4 mg total) by mouth every 6 hours as needed for Nausea.   ??? orphenadrine Take 1 tablet (100 mg total) by mouth 2 times a day.   ??? oxyCODONE Take 1 tablet (5 mg total) by mouth every 4 hours as needed for Pain.   ??? oxyCODONE-acetaminophen 1 tablet every 4 hours as needed.      ??? tamsulosin Take 0.4 mg by mouth daily   ??? traZODone      Allergies   Allergen Reactions   ??? Penicillins Hives       ROS:  Positive for right side weakness. Negative for chest pain and SOB.    Exam:  Filed Vitals:    07/28/15 1318   BP: 189/110   Pulse: 92   Resp: 19   SpO2: 97%       Neuro Exam:  NIH Stroke Scale    1a. Level of consciousness:  0   1b. LOC questions:   0   1c. LOC commands:  0   2 Best Gaze:  0   3 Visual:  0   4  Facial Palsy:  2   5a. Motor left arm:  0   5b Motor right arm:  4   6a Motor left leg:  0   6b Motor right leg:   3   7 Limb Ataxia:  0   8 Sensory:  1   9 Best Language:   0   10 Dysarthria:  1   11 Extinction and Inattention:  0    Total:   11       Time NIHSS Completed:  1310    Other exam findings of note: none    Labs:   Lab Results   Component Value Date    WBC 5.3 04/27/2014    HGB 15.4 04/27/2014    HCT 45.9 04/27/2014    MCV 95.2 04/27/2014    PLT 131* 04/27/2014     Lab Results   Component Value Date    NA 136 04/27/2014    K 3.3* 04/27/2014    CL 108 04/27/2014    CO2 22 04/27/2014     Lab Results   Component Value Date    BUN 23 04/27/2014     Lab Results   Component Value Date    CREATININE 1.11 04/27/2014     Lab Results   Component Value Date    INR 1.1 03/21/2014     No results found for: TROPONINI  No results found for: HGBA1C    Radiology:  Head CT: negative for bleed  CT angiogram head/neck: no LVO per phone discussion with neuro-rad    Impression:  [name] is a 60 y.o. male who presented with acute ischemic stroke s/p tpa.    Recommendations:    For patients who have received tPA: DO NOT give antiplatelet or anticoagulant medications (including subcutaneous heparin) until repeat imaging 24 hours after tPA administration confirms no  hemorrhagic transformation.    1.  Please follow neurology recommendations for stroke workup/secondary prevention guidance.  2. Patient should have DVT prophylaxis (SCDs only for first 24 hours if t-PA given) unless contraindicated.  3. Patient should get aspirin (hold for 24 hours if t-PA given) unless contraindicated  4.  Keep patient NPO until passes swallow evaluation  5. Blood pressure goal, if t-PA given: <180/105 for 24 hours  6. If t-PA given, please order repeat head CT (or MRI if indicated) at 24 +/- 6 hours to evaluate for hemorrhagic transformation      Critical Care Time: 30 minutes    Delbert Phenix  [facility] Stroke  Team  [phone]  07/28/2015  1:30 PM      Newton Pigg, MD  07/28/15 [phone]

## 2015-07-28 NOTE — Unmapped (Signed)
Leave HOB flat until notified per [facility] Clent Ridges MD.

## 2015-07-28 NOTE — Unmapped (Signed)
ED Attending Attestation Note    Date of service:  07/28/2015    This patient was seen by the resident physician.  I have seen and examined the patient, agree with the workup, evaluation, management and diagnosis. The care plan has been discussed and I concur.  I have reviewed the ECG and concur with the resident's interpretation.    My assessment reveals a 60 y.o. male who was talking to his girlfriend at 11:30 AM and found to be normal and went upstairs when his son began complaining that the patient was not acting himself.  The patient has a reliable last known normal of 11:30 with an acute mental status change.  The patient has a dense right upper extremity hemiplegia right-sided facial droop and right leg weakness with some numbness of the right arm concerning for acute ischemic stroke.  The patient did proceed directly to head CT which revealed no acute hemorrhagic bleed.  He was found to be hypertensive by EMS such that he was brought back to the SRU after head CT in order to better regulate his blood pressure which indeed was found to have a diastolic hypertension of 115 and the patient received labetalol and then a nicardipine drip in order to improve his blood pressure.  I was able to speak to the patient as well as the patient's girlfriend and son the lateral whom I spoke to over the phone who are all in agreement that the patient should receive TPA given the risks and benefits and TPA was administered.  The CT angiogram reveals no proximal occlusion and no indication for angiography at this time.  The patient will be admitted to the neurosurgical intensive care unit      NIH Stroke Scale      Interval: Baseline  Time of Evaluation: 13:18  Person Administering Scale: [name]    1a  Level of consciousness: 0=alert; keenly responsive   1b. LOC questions:  0=Performs both tasks correctly   1c. LOC commands: 0=Performs both tasks correctly   2.  Best Gaze: 0=normal   3.  Visual: 0=No visual loss   4.  Facial Palsy: 3=Normal symmetric movement   5a.  Motor left arm: 0=No drift, limb holds 90 (or 45) degrees for full 10 seconds   5b.  Motor right arm: 4=No movement   6a. motor left leg: 0=No drift, limb holds 90 (or 45) degrees for full 10 seconds   6b  Motor right leg:  2=Some effort against gravity, limb cannot get to or maintain (if cured) 90 (or 45) degrees, drifts down to bed, but has some effort against gravity   7. Limb Ataxia: 0=Absent   8.  Sensory: 1=Mild to moderate sensory loss; patient feels pinprick is less sharp or is dull on the affected side; there is a loss of superficial pain with pinprick but the patient is aware of being touched   9. Best Language:  0=No aphasia, normal   10. Dysarthria: 1=Mild to moderate, patient slurs at least some words and at worst, can be understood with some difficulty   11. Extinction and Inattention: 0=No abnormality   12. Distal motor function: 0=Normal    Total:   11

## 2015-07-28 NOTE — Unmapped (Signed)
[facility]    Trauma / Critically Ill Assessment      [name]  [id number]    Reason for Referral / Presenting Problem:     stroke    Family Contact and Involvement:  Deniece Ree sister [phone]  [name] Achor girlfriend [phone]  Assessment and Social Work Interventions:    Patient arrived per squad EMS after his girlfriend noticed slurred speech and R sided weakness. SW met with girlfriend at bedside who was tearful and anxious. SW provided support and counseling as girlfriend stated she was his NOK. Further family presented to the [facility] and escorted to see patient.    Safety Concerns:          NA  Referral / Disposition Plan:      Patient admitted to in patient status and followed by in patient social work for further intervention as requested.

## 2015-07-29 ENCOUNTER — Inpatient Hospital Stay: Admit: 2015-07-29 | Payer: MEDICAID

## 2015-07-29 LAB — BASIC METABOLIC PANEL
Anion Gap: 8 mmol/L (ref 3–16)
BUN: 12 mg/dL (ref 7–25)
CO2: 24 mmol/L (ref 21–33)
Calcium: 9.2 mg/dL (ref 8.6–10.3)
Chloride: 106 mmol/L (ref 98–110)
Creatinine: 0.94 mg/dL (ref 0.60–1.30)
Glucose: 85 mg/dL (ref 70–100)
Osmolality, Calculated: 285 mOsm/kg (ref 278–305)
Potassium: 3.9 mmol/L (ref 3.5–5.3)
Sodium: 138 mmol/L (ref 133–146)
eGFR AA CKD-EPI: >90 See note.
eGFR NONAA CKD-EPI: 88 See note.

## 2015-07-29 LAB — CBC
Hematocrit: 46.1 % (ref 38.5–50.0)
Hemoglobin: 15.3 g/dL (ref 13.2–17.1)
MCH: 31.4 pg (ref 27.0–33.0)
MCHC: 33.3 g/dL (ref 32.0–36.0)
MCV: 94.4 fL (ref 80.0–100.0)
MPV: 9.5 fL (ref 7.5–11.5)
Platelets: 145 10*3/uL (ref 140–400)
RBC: 4.89 10*6/uL (ref 4.20–5.80)
RDW: 13.9 % (ref 11.0–15.0)
WBC: 7.2 10*3/uL (ref 3.8–10.8)

## 2015-07-29 LAB — POC GLU MONITORING DEVICE
POC Glucose Monitoring Device: 89 mg/dL (ref 70–100)
POC Glucose Monitoring Device: 88 mg/dL (ref 70–100)
POC Glucose Monitoring Device: 104 mg/dL (ref 70–100)
POC Glucose Monitoring Device: 136 mg/dL (ref 70–100)

## 2015-07-29 LAB — LIPID PANEL
Cholesterol, Total: 188 mg/dL (ref 0–200)
HDL: 55 mg/dL (ref 60–92)
LDL Cholesterol: 123 mg/dL
Triglycerides: 51 mg/dL (ref 10–149)

## 2015-07-29 LAB — PHOSPHORUS: Phosphorus: 3.4 mg/dL (ref 2.1–4.7)

## 2015-07-29 LAB — MAGNESIUM: Magnesium: 1.9 mg/dL (ref 1.5–2.5)

## 2015-07-29 LAB — THYROID FUNCTION CASCADE: TSH: 1.18 u[IU]/mL (ref 0.34–5.60)

## 2015-07-29 MED ORDER — midazolam (PF) (VERSED) injection 1 mg
1 | INTRAMUSCULAR | Status: AC | PRN
Start: 2015-07-29 — End: 2015-07-31

## 2015-07-29 MED ORDER — potassium chloride (KCl)/Sterile water 100 mL IVPB 10 mEq
10 | INTRAVENOUS | Status: AC
Start: 2015-07-29 — End: 2015-07-29
  Administered 2015-07-29 (×4): 10 meq via INTRAVENOUS

## 2015-07-29 MED ORDER — valsartan (DIOVAN) tablet 80 mg
80 | Freq: Every day | ORAL | Status: AC
Start: 2015-07-29 — End: 2015-07-31
  Administered 2015-07-29 – 2015-07-31 (×3): 80 mg via ORAL

## 2015-07-29 MED ORDER — diazePAM (VALIUM) Syrg 2.5 mg
5 | INTRAMUSCULAR | Status: AC | PRN
Start: 2015-07-29 — End: 2015-07-31

## 2015-07-29 MED ORDER — midazolam (PF) (VERSED) 1 mg/mL injection
1 | INTRAMUSCULAR | Status: AC
Start: 2015-07-29 — End: 2015-07-29
  Administered 2015-07-29: 14:00:00 1 via INTRAVENOUS

## 2015-07-29 MED ORDER — oxyCODONE (ROXICODONE) immediate release tablet 5 mg
5 | ORAL | Status: AC | PRN
Start: 2015-07-29 — End: 2015-07-31
  Administered 2015-07-29 – 2015-07-30 (×2): 5 mg via ORAL

## 2015-07-29 MED ORDER — fentaNYL (SUBLIMAZE) injection 25 mcg
50 | INTRAMUSCULAR | Status: AC | PRN
Start: 2015-07-29 — End: 2015-07-29
  Administered 2015-07-29 (×2): 25 ug via INTRAVENOUS

## 2015-07-29 MED ORDER — aspirin EC tablet 81 mg
81 | Freq: Every day | ORAL | Status: AC
Start: 2015-07-29 — End: 2015-07-31
  Administered 2015-07-30 – 2015-07-31 (×2): 81 mg via ORAL

## 2015-07-29 MED ORDER — magnesium sulfate in sterile water 100 mL IVPB 4 g
4 | Freq: Once | INTRAVENOUS | Status: AC
Start: 2015-07-29 — End: 2015-07-29
  Administered 2015-07-29: 08:00:00 4 g via INTRAVENOUS

## 2015-07-29 MED ORDER — heparin (porcine) injection 5,000 Units
5000 | Freq: Three times a day (TID) | INTRAMUSCULAR | Status: AC
Start: 2015-07-29 — End: 2015-07-31
  Administered 2015-07-30 – 2015-07-31 (×5): 5000 [IU] via SUBCUTANEOUS

## 2015-07-29 MED ORDER — diazePAM (VALIUM) Syrg 2.5 mg
5 | Freq: Once | INTRAMUSCULAR | Status: AC
Start: 2015-07-29 — End: 2015-07-29

## 2015-07-29 MED ORDER — diazePAMVALIUM5mgmLSyrg
5 | INTRAMUSCULAR | Status: AC
Start: 2015-07-29 — End: 2015-07-29
  Administered 2015-07-29: 14:00:00 2.5 via INTRAVENOUS

## 2015-07-29 MED ORDER — ICU ELECTROLYTE REPLACEMENT PROTOCOL
Status: AC | PRN
Start: 2015-07-29 — End: 2015-07-31

## 2015-07-29 MED ORDER — fentaNYL (SUBLIMAZE) 50 mcg/mL injection
50 | INTRAMUSCULAR | Status: AC
Start: 2015-07-29 — End: 2015-07-29

## 2015-07-29 MED ORDER — oxyCODONE (ROXICODONE) immediate release tablet 10 mg
5 | ORAL | Status: AC | PRN
Start: 2015-07-29 — End: 2015-07-31
  Administered 2015-07-30 – 2015-07-31 (×5): 10 mg via ORAL

## 2015-07-29 MED ORDER — midazolam (PF) (VERSED) injection 1 mg
1 | Freq: Once | INTRAMUSCULAR | Status: AC
Start: 2015-07-29 — End: 2015-07-29

## 2015-07-29 MED ORDER — magnesium sulfate in D5W 100 mL 1 gram/100 mL IVPB 1 g
1 | INTRAVENOUS | Status: AC
Start: 2015-07-29 — End: 2015-07-29

## 2015-07-29 MED ORDER — valsartan (DIOVAN) tablet 40 mg
40 | Freq: Every day | ORAL | Status: AC
Start: 2015-07-29 — End: 2015-07-29

## 2015-07-29 MED ORDER — nicotine (NICODERM CQ) 14 mg/24 hr 1 patch
14 | Freq: Every day | TRANSDERMAL | Status: AC
Start: 2015-07-29 — End: 2015-07-31
  Administered 2015-07-29 – 2015-07-31 (×3): 1 via TRANSDERMAL

## 2015-07-29 MED ORDER — valsartan (DIOVAN) tablet 160 mg
160 | Freq: Every day | ORAL | Status: AC
Start: 2015-07-29 — End: 2015-07-29

## 2015-07-29 MED FILL — ICU ELECTROLYTE REPLACEMENT PROTOCOL: Qty: 1

## 2015-07-29 MED FILL — OXYCODONE 5 MG TABLET: 5 5 MG | ORAL | Qty: 1

## 2015-07-29 MED FILL — NICOTINE 14 MG/24 HR DAILY TRANSDERMAL PATCH: 14 14 mg/24 hr | TRANSDERMAL | Qty: 1

## 2015-07-29 MED FILL — FENTANYL (PF) 50 MCG/ML INJECTION SOLUTION: 50 50 mcg/mL | INTRAMUSCULAR | Qty: 2

## 2015-07-29 MED FILL — VALSARTAN 160 MG TABLET: 160 160 MG | ORAL | Qty: 1

## 2015-07-29 MED FILL — POTASSIUM CHLORIDE 10 MEQ/100ML IN STERILE WATER INTRAVENOUS PIGGYBACK: 10 10 mEq/100 mL | INTRAVENOUS | Qty: 400

## 2015-07-29 MED FILL — MIDAZOLAM (PF) 1 MG/ML INJECTION SOLUTION: 1 1 mg/mL | INTRAMUSCULAR | Qty: 2

## 2015-07-29 MED FILL — MAGNESIUM SULFATE 4 GRAM/100 ML (4 %) IN WATER INTRAVENOUS PIGGYBACK: 4 4 gram/100 mL (4 %) | INTRAVENOUS | Qty: 100

## 2015-07-29 MED FILL — VALSARTAN 40 MG TABLET: 40 40 MG | ORAL | Qty: 1

## 2015-07-29 MED FILL — DIAZEPAM 5 MG/ML INJECTION SYRINGE: 5 5 mg/mL | INTRAMUSCULAR | Qty: 2

## 2015-07-29 MED FILL — HEPARIN (PORCINE) 5,000 UNIT/ML INJECTION SOLUTION: 5000 5,000 unit/mL | INTRAMUSCULAR | Qty: 1

## 2015-07-29 MED FILL — ATORVASTATIN 80 MG TABLET: 80 80 MG | ORAL | Qty: 1

## 2015-07-29 NOTE — Unmapped (Signed)
Physical Therapy/Occupational Therapy   Reason Patient Not Seen         Name: [name]  DOB: Jun 29, 1955  Attending Physician: Verita Lamb, MD  Admission Diagnosis: Acute ischemic stroke [I63.9]  Date: 07/29/2015  Reviewed Pertinent hospital course: Yes    Unable to see patient due to :Pt preparing to leave for MRI and within 24 hour tPA window until mid-afternoon.     Will follow up for initial evaluation on 7/2 as able and appropriate.      Aura Camps, PT, DPT  Physical Therapist  Pager: [phone]  Department: [phone]  Schedule: Monday to Friday 8:00 to 4:30

## 2015-07-29 NOTE — Unmapped (Signed)
Pt became aggrivated approx 0500 stating he wants to Go home and die. Clent Ridges MD notified soon after. Pt was also complaining of pain d/t chronic back and neck pain. Pt ordered 25 mcg fentanyl prn every hour.

## 2015-07-29 NOTE — Unmapped (Signed)
Critical Care Attending Note: Heloise Ochoa MD   Note Date: 07/29/2015  Note Time: 11:45 AM     Patient: [name]  Age, DOB: 60 y.o., 05/06/1955  MRN: [id number]  Patient Location: [facility]-12/[facility]-12  Code Status: Full Code   Hospital Admission Date: 07/28/2015      Hospital Length of Stay: 1  days      ICU Length of Stay: 1 days     Patient seen and examined, medical record reviewed, discussed with house staff and nursing staff.        Brief Historical Review: Impression/Active Problems:   60 year old man with HTN, DMII, COPD, GERD, and chronic back pain presenting to [facility] ED for evaluation of Right-sided weakness with [facility] 1130 07/28/15.?? Patient's girlfriend states she checked on him at 1130 and he was down stairs watching TV and acting appropriately.  Girlfriend then checked on him again at 1215 and found him with slurred speech and unable to move his RUE/RLE.  EMS was called.  Patient arrived at [facility] with RUE/RLE weakness.  Patient received IV tPA (07/28/15 ~14:00-15:00) in ED and Nicardipine gtt for BP control (SBP 180s).     Transferred to NSICU [facility] for AIS s/p IV tPA Management.     Patient states he is compliant with his home medications.  Denies drug use.  Smokes 10 cigarettes per day.  Denies HA, CP, SOB, and abdominal pain.     Prescriptions prior to admission   Medication Sig Dispense Refill Last Dose   ??? ALBUTEROL INHL Inhale into the lungs.   Taking   ??? amLODIPine (NORVASC) 10 MG tablet Take 10 mg by mouth daily   Taking   ??? blood sugar diagnostic Strp Test daily   Taking   ??? blood-glucose meter Misc by Does not apply route.   Taking   ??? buPROPion SR (WELLBUTRIN SR) 100 MG tablet Take 100 mg by mouth 2 times daily Ran out of prescription   Taking   ??? cloNIDine (CATAPRES) 0.2 mg/24 hr patch Place 1 patch onto the skin once a week.   Taking   ??? entecavir (BARACLUDE) 0.5 MG tablet Take 1 tablet (0.5 mg total) by mouth daily. 30 tablet 2    ??? FLUoxetine (PROZAC) 20 MG capsule    Taking   ???  ibuprofen (ADVIL,MOTRIN) 800 MG tablet Take 0.5 tablets (400 mg total) by mouth every 6 hours as needed for up to 16 doses. 16 tablet 0    ??? ketoconazole (NIZORAL) 2 % cream    Taking   ??? loratadine 10 mg Cap Take 1 tablet by mouth daily   Taking   ??? losartan (COZAAR) 100 MG tablet Take 100 mg by mouth daily   Taking   ??? metFORMIN (GLUCOPHAGE) 500 MG tablet Take 500 mg by mouth 2 times a day with meals.   Taking   ??? ondansetron (ZOFRAN ODT) 4 MG disintegrating tablet Take 1 tablet (4 mg total) by mouth every 6 hours as needed for Nausea. 16 tablet 0    ??? orphenadrine (NORFLEX) 100 mg tablet Take 1 tablet (100 mg total) by mouth 2 times a day. 10 tablet 0    ??? oxyCODONE (ROXICODONE) 5 MG immediate release tablet Take 1 tablet (5 mg total) by mouth every 4 hours as needed for Pain. 30 tablet 0 Taking   ??? oxyCODONE-acetaminophen (PERCOCET) 10-325 mg per tablet 1 tablet every 4 hours as needed.      Taking   ??? tamsulosin (FLOMAX)  0.4 mg Cp24 Take 0.4 mg by mouth daily   Taking   ??? 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 Total time spent caring for this patient, coordinating care, and in necessary discussion with the family related to treatment decisions has been 75 minutes. This time excludes any separately billable procedures.     EVENTS since Admission: Main ICU Plans:   7/1:  MRI Brain with left corona radiata ischemic infarct.    Drips(current): None    Foley: No, If yes:  NA/   Patient Lines/Drains/Airways Status    Active Epidural Line / PICC Line / PIV Line / ART Line / Line / CVC Line     Name:   Placement date:   Placement time:   Site:   Days:    Peripheral IV 07/28/15 Right Forearm  07/28/15   1530   Forearm   less than 1    Peripheral IV 07/28/15 Right Antecubital  07/28/15      Antecubital   1    Peripheral IV 07/28/15 Left Antecubital  07/28/15      Antecubital   1              Ventilator Settings: R/A  ABG: None  SBT: None  Secretions: None    24 Hr I/O: 2104/1150  TF/Diet: NPO  Glucose: 85  Last BM: None    Na: 138  Cr: 0.94  WBC: 7.2  Hgb: 15.3  PLT: 145  INR: 1.1     Troponin: <0.04    EKG: normal EKG, normal sinus rhythm  CXR: Normal.    CT Head: Small focus of low-attenuation in the right anterior limb internal capsule, not well seen previously. An evolving lacunar infarct could be considered, but would not explain the previous history of right upper/lower extremity weakness and aphasia. A definite evolving left MCA distribution infarct is not visible, but MR may be helpful for further characterization if clinically indicated.    CTA Head:  Decreased contrast opacification within a left M3 branch vessel. No major vessel occlusion. ??  ??  CTA Neck:  1.?? No major vessel occlusion.  2.?? Nonspecific 1-cm lobular low density lesion in the right parotid gland.  3.?? Emphysema.     MRI Brain: Small acute deep white matter infarct in the left posterior frontal lobe.      Current Medications:  ??? atorvastatin  80 mg Oral Nightly (2100)   ??? insulin regular  0-12 Units Subcutaneous Q6H6 [facility]      General ICU Care:  (F)EEDING: Diet Regular   (F)OLEY: N/A, If yes: N/A  (A)NALGESIA: Oxycodone PO 10 mg PRN  (A)CCESS: PIV  (S)EDATION: Avoid  (T)HROMBOTIC PPx: SQH  (E)ARLY MOBILIZATION: Level 4; Assisted Walking  (R)AYS OF LIGHT/SPIRITUAL CARE: Window    (H)OB >30  (U)LCER PPx/BOWEL REGIMEN: Not Indicated: N/A  (G)LYCEMIC CONTROL: SSI  (S)OCIAL WORKER:  PT/OT AND ST    DISPO: Transfer to  floor. Neurology Service.     Neuro: Acute-Subacute Left Posterior Frontal Ischemic Infarct s/p IV tPA  Avoid hypotonic and glucose-containing solutions  Avoid vasodilators (nitrates, etc.)    Post-tPA Protocol:  Protocol as per nursing  Maintain BP <180/105, avoiding hypotension  NO AP/AC x24HR  Check CBC, Coags and Fibrinogen level in the AM  Avoid Unnecessary Blood Draws and Invasive Procedures (use clinical judgment)  Maintain NPO and Bedrest    Serial noncontrast CT Head to monitor for cerebral edema PRN  Pupillometer q1hr if neuro exam unreliable, trend NPI PRN    Start ASA 81 mg Daily    MRI Brain (Stroke Protocol)  BP Goals as below    EKG  TTE with bubble study    FLP (188/51/55/123) and HgbA1c (5.7%)  High-Dose Statin  Consider ACEI/ARB    Maintain BG 120-180 mg/dL  Bedside Swallow and/or Speech Evaluation  PT/OT and SW    Cardiovascular: HTN  Consider Losartan   MAP >65 and SBP Goal 100-180  F/U EKG, Troponin  TTE PENDING    Respiratory: COPD/Emphysema  Duonebs PRN  F/U CXR, ABG PRN  Routine Oral Hygiene  Incentive Spirometry While Awake    GI:  Diet Regular  Continue Bowel Regimen    ID:  Temp (24hrs), Avg:98.3 ??F (36.8 ??C), Min:97.4 ??F (36.3 ??C), Max:98.9 ??F (37.2 ??C)        Lab 07/29/15  0153 07/28/15  1613 07/28/15  1323   WBC 7.2 8.0 6.3     Pan-Cultures and UA if febrile    Renal:  Monitor Sodium and BUN/Creatinine  Strict Input AND Output    Fluids/Electrolytes/Nutrition:  KVO  Goal Serum K >3.5-4, PO4 >3, Mg >2 AND Ionized CA >1.00    Hematology:  Monitor CBC and Coagulation Profile  Target Hgb 7-9 G/dL     Endocrine:   High Dose SSI  Maintain BG 100-180 AND Lispro PRN  Cortisol Levels PRN    Injury/Disease Specific Needs:  Tobacco Abuse - Nictoine Patch     Airway management  Interpretation of ABG, chemistry, hematololgy, micro labs  Interpretation of CXR, ECG, and/or CT results  Other: AIS Managment    Jerilynn Mages Sharetta Ricchio MD  07/29/2015  11:45 AM     Physical Exam: Ventilator Settings:   Vital  Signs:  BP 151/92 mmHg   Pulse 61   Temp(Src) 97.4 ??F (36.3 ??C) (Oral)   Resp 18   Ht 5' 11 (1.803 m)   Wt 168 lb (76.204 kg)   BMI 23.44 kg/m2   SpO2 98%    Temp:  [97.4 ??F (36.3 ??C)-98.9 ??F (37.2 ??C)] 97.4 ??F (36.3 ??C)  Heart Rate:  [61-96] 61  Resp:  [11-25] 18  BP: (124-195)/(70-130) 151/92 mmHg    General: GCS 15    Neuro: AAOx3, follows commands, attends/regards, no aphasia, mild dysarthria (?chronic), PERRLA, EOMI, FS, TML, SCM 5/5, 5/5 throughout and no drift, LT intact, no ataxia, and no neglect/extinction.    CVS: S1, S2, No Murmurs  Respiratory: CTAB/L   GI: Soft NT/NG/ND, BS +  Extremities: No Edema  Skin/Surgical Wound: N/A    -----------------------------------------  1a. Level of Consciousness: 0   1b. LOC Questions: 0  1c. LOC Commands: 0  2. Best Gaze: 0  3. Visual: 0  4. Facial Palsy: 0  5a. Motor Arm (L): 0  5b. Motor Arm (R): 0  6a. Motor Leg (L): 0  6b. Motor  Leg (R): 0  7. Limb Ataxia: 0  8. Sensory: 0  9. Best Language: 0  10. Dysarthria: 1  11. Extinction/Inattention: 0    Total NIHSS Score: 1  -----------------------------------------        Input / Output:    I/O last 3 completed shifts:  In: 2104.9 [I.V.:1577.9; IV Piggyback:527]  Out: 1150 [Urine:1150]             Microbiology:      Culture:       Date:       Results:         Antibiotics (day):      Radiologic Images Reviewed:        PACS         Labs Reviewed:    No results found for: PHENYTOIN, PHENOBARB, VALPROATE, CBMZ  No results found for: PROTEINCSF, GLUCCSF, CFLCCOM, CULTCSF        Invalid input(s): CO2ART, HBO2PE      Lab 07/28/15  1613 07/28/15  1323   TROPONIN I <0.04 <0.04         Lab 07/28/15  1613   ALT 11   AST 18   BILIRUBIN TOTAL 1.3   ALK PHOS 87         Lab 07/28/15  1613   ALBUMIN 4.3         Lab 07/29/15  0153 07/28/15  1613 07/28/15  1323   SODIUM 138 138 135   CHLORIDE 106 103 104   POTASSIUM 3.9 4.3 3.9   MAGNESIUM 1.9  --   --    PHOSPHORUS 3.4  --   --    CALCIUM 9.2 9.4 8.9         Lab 07/29/15  0153  07/28/15  1613 07/28/15  1323   BUN 12 14 15    CREATININE 0.94 1.04 1.04   CO2 24 25 24       No results found for: Robyne Peers, CLUR      Lab 07/29/15  0153 07/28/15  1613 07/28/15  1323   WBC 7.2 8.0 6.3         Lab Results   Component Value Date    KETONESU 5* 07/28/2015    BLOODU Moderate* 07/28/2015    BILIRUBINUR Negative 07/28/2015    UROBILINOGEN <2.0 07/28/2015    PROTEINUA Negative 07/28/2015    NITRITE Negative 07/28/2015    LEUKOCYTESUR Negative 07/28/2015    PHUR 7.0 07/28/2015    LABSPEC 1.023 07/28/2015    CLARITYU Clear 07/28/2015    COLORU Straw 07/28/2015         Lab 07/29/15  0153 07/28/15  1613 07/28/15  1323   HEMOGLOBIN 15.3 16.1 15.2   HEMATOCRIT 46.1 47.9 44.9   PLATELETS 145 148 140         Lab 07/28/15  1323   PROTHROMBIN TIME 13.9   INR 1.1   APTT 30.5         Lab 07/29/15  0153 07/28/15  1613 07/28/15  1323   GLUCOSE 85 97   97 105*

## 2015-07-29 NOTE — Unmapped (Signed)
Speech Language Pathology  Clinical Swallow Assessment    Name: [name]  DOB: December 21, 1955  Attending Physician: Verita Lamb, MD  Admission Diagnosis: Acute ischemic stroke [I63.9]  Date: 07/29/2015  Precautions: Fall risk  Reviewed Pertinent hospital course: Yes  Hospital Course SLP: 60 yo male with PMH HTN, DM, COPD, GERD, chronic back pain presenting to ED for evaluation of R sided weakness CT Head 6/30:Small focus of low-attenuation in the right anterior limb internal capsule, not well seen previously. An evolving lacunar infarct could be considered, but would not explain the previous history of right upper/lower extremity weakness and aphasia. A definite evolving left MCA distribution infarct is not visible, but MR may be helpful for further characterization if clinically indicated. MRI pending. CXR 6/30: ??No previous SLP services.      Assessment: Patient presents with oral dysphagia secondary to right perioral weakness, right lingual deviation resulting in no signs and symptoms of penetration/aspiration. Patient is at an increased risk of aspiration. Patient does endorse decreased right facial sensation . Based on the current assessment a Regular diet with no liquid consistency restrictions, no concentrated carbs  is recommended. Will follow Patient for oral motor strengthening exercises.    Plan:  1. SLP therapy 1-3x/week while inpatient   2. SLP at discharge is not anticipated  3. Diet Recommended: Regular diet with no liquid consistency restrictions, no concentrated carbs    Orientation:  Person: Yes  Place: Yes  Time: Yes  Situation: Yes    Pain:  Pain Score: 0-No pain    Aspiration Risk  Risk for Aspiration: None  Aspiration Risk Recommendations: Dysphagia treatment  Dysphagia Diagnosis: Mild oral stage dysphagia  Compensatory Swallowing Strategies: Upright as possible for all oral intake, Remain upright for 20-30 minutes after meals  Diet Solids Recommendation: Regular  Diet Liquids  Recommendations: No Liquid Consistency Restrictions  Recommended Form of Meds: Whole    Goals  Goals Short Term Dysphagia  Patient will demonstrate use of swallowing exercises: oral motor to 100% acc independently  Time frame for goals to be met in: 08/04/15     Goals Long Term Dysphagia  Patient will tolerate least restrictive diet with no signs or symptoms of aspiration : to 100% acc independently  Long term dysphagia misc: 08/11/15      Baseline Assessment  History of Intubation: No  Behavior/Cognition: Alert;Cooperative  Dentition: Some missing teeth;Adequate  Vision: Functional for self-feeding  Patient Positioning: Upright in bed  Volitional Cough: Strong  Volitional Swallow: WFL    Respiratory Status  Respiratory Status: Room air    Oral/Motor  Labial ROM: Within Functional Limits  Labial Symmetry: Abnormal symmetry right  Labial Strength: Within Functional Limits  Labial Sensation: Within Functional Limits  Lingual ROM: Within Functional Limits  Lingual Symmetry: Abnormal symmetry right  Lingual Strength: Reduced  Lingual Sensation: Within Functional Limits  Facial ROM: Reduced right  Facial Symmetry: Right droop (flattened nasolabial fold)  Facial Strength: Reduced  Facial Sensation: Reduced (duller V1, 2, 3)  Velum: Within Functional Limits  Mandible: Within Functional Limits  Baseline Vocal Quality: Normal  Vocal Intensity: Within Functional Limits  Gag: Reduced  Intelligibility: Intelligible  Dysarthria : None  Breath Support: Adequate for speech  Dentition: Some missing teeth;Adequate  Hearing Exceptions: None    Consistencies Assessed  Thin;Puree;Soft Solid;Hard Solid    Thin  Presentation: Straw (1-6; 18-20 successive swallows)  Oral: Within functional limits  Pharyngeal: Within Functional Limits    Puree  Presentation: Spoon (7-10)  Oral: Within functional limits  Pharyngeal: Within functional limits    Soft Solid  Presentation: Spoon (11-14)  Oral: Within functional limits  Pharyngeal: Within  functional limits    Solid  Presentation: Self Fed (15-17)  Oral: Within functional limits  Pharyngeal: Within functional limits    Time  Start Time: 1138  Stop Time: 1200  Time Calculation (min): 22 min     Education:  Patient educated on role of SLP, current POC, and discharge recommendations for SLP therapy.   Patient educated on swallowing anatomy & physiology, dysphagia, risks of aspiration, current diet recommendations, and safe swallowing behaviors.     Patient demonstrated understanding.    Ardeen Fillers, M.A., CCC-SLP  Speech-Language Pathologist  MBSImP Certified Clinician  Pager: [phone]  Office: [phone]  M/W/: 7:00 TO 2:30  F:  7:00 TO 1:30       Charges   $Clinical Swallow: 1 Procedure     Patient Class   Inpatient

## 2015-07-29 NOTE — Unmapped (Signed)
Neurology H&P    CC: R sided weakness    HPI: [name] is a 60 y.o. year old who  has a past medical history of Hypertension; Diabetes mellitus; COPD (chronic obstructive pulmonary disease); GERD (gastroesophageal reflux disease); Hepatitis; and Dysphagia. who presented to the ED yesterday complaining of R sided weakness, in both the upper and lower extremity. He also states that at the time of onset he was also having difficulty speaking.Last known normal was 11:30 on 6/30. At 12:15pm was found to be slurring speech and unable to move his R side.  A CT head in the ED displayed evidence of acute ischemic left MCA stroke, with a  Stroke scale of 11. The patient was found to be within the time window and was given tPA in the ED between 1400-1500 in addition to a nicardipine drip due to SBPs in 180 prior to being transferred to the NSICU.    Patient last seen normal at: Date:0630/2017?? 1130  Patient Arrived to ED at:1300  Stroke Team contacted at: (already in ED)  Arrival of team to ED: already in ED  Review of head CT by me or radiology:1307  Time of t-PA bolus:1326  Door to needle time:26 minutes    A repeat head CT showed an evolving ischemic stroke without hemorrhage.        Medications:   Prior to Admission medications    Medication Sig Start Date End Date Taking? Authorizing Provider   ALBUTEROL INHL Inhale into the lungs.    Historical Provider, MD   amLODIPine (NORVASC) 10 MG tablet Take 10 mg by mouth daily    Historical Provider, MD   blood sugar diagnostic Strp Test daily 06/14/11   Historical Provider, MD   blood-glucose meter Misc by Does not apply route. 06/14/11   Historical Provider, MD   buPROPion SR (WELLBUTRIN SR) 100 MG tablet Take 100 mg by mouth 2 times daily Ran out of prescription    Historical Provider, MD   cloNIDine (CATAPRES) 0.2 mg/24 hr patch Place 1 patch onto the skin once a week.    Historical Provider, MD   entecavir (BARACLUDE) 0.5 MG tablet Take 1 tablet (0.5 mg total) by mouth  daily. 11/17/14   Georgeann Oppenheim, MD   FLUoxetine (PROZAC) 20 MG capsule  03/18/14   Historical Provider, MD   ibuprofen (ADVIL,MOTRIN) 800 MG tablet Take 0.5 tablets (400 mg total) by mouth every 6 hours as needed for up to 16 doses. 02/24/15   Arville Go, MD   ketoconazole (NIZORAL) 2 % cream  02/15/14   Historical Provider, MD   loratadine 10 mg Cap Take 1 tablet by mouth daily    Historical Provider, MD   losartan (COZAAR) 100 MG tablet Take 100 mg by mouth daily    Historical Provider, MD   metFORMIN (GLUCOPHAGE) 500 MG tablet Take 500 mg by mouth 2 times a day with meals.    Historical Provider, MD   ondansetron (ZOFRAN ODT) 4 MG disintegrating tablet Take 1 tablet (4 mg total) by mouth every 6 hours as needed for Nausea. 04/27/14   Alyse Low, MD   orphenadrine (NORFLEX) 100 mg tablet Take 1 tablet (100 mg total) by mouth 2 times a day. 02/24/15   Arville Go, MD   oxyCODONE (ROXICODONE) 5 MG immediate release tablet Take 1 tablet (5 mg total) by mouth every 4 hours as needed for Pain. 03/26/14   Isabel Caprice, MD   oxyCODONE-acetaminophen (PERCOCET) 10-325 mg  per tablet 1 tablet every 4 hours as needed.    10/26/13   Historical Provider, MD   tamsulosin (FLOMAX) 0.4 mg Cp24 Take 0.4 mg by mouth daily    Historical Provider, MD   traZODone (DESYREL) 100 MG tablet  03/18/14   Historical Provider, MD      PMHx:  has a past medical history of Hypertension; Diabetes mellitus; COPD (chronic obstructive pulmonary disease); GERD (gastroesophageal reflux disease); Hepatitis; and Dysphagia.  Allergies: Penicillins  PSHx:  has past surgical history that includes Foot surgery (Left); Esophagogastroduodenoscopy (N/A, 11/19/2013); and Hernia repair.  Social Hx:  Social History   Substance Use Topics   ??? Smoking status: Current Every Day Smoker -- 0.25 packs/day for 5 years     Types: Cigarettes   ??? Smokeless tobacco: Not on file   ??? Alcohol Use: No     ZOX:WRUEAV history is not on file.      ROS:  GEN: no recent  fever, chills or night sweats   HEENT: no changes in vision, sore throat, rhinorrhea   CV: no cp, sob or orthopnea  Pulm: no cough or hemoptysis   GI: no nausea, vomiting, diarrhea or abdominal pain  GU: no bowel or bladder incontinence, no dysuria  MSK: no muscle or joint pain  Skin: no rashes, wounds or itching      OBJECTIVE  Blood pressure 177/94, pulse 71, temperature 96.8 ??F (36 ??C), temperature source Oral, resp. rate 18, height 5' 11 (1.803 m), weight 168 lb (76.204 kg), SpO2 99 %.  Body mass index is 23.44 kg/(m^2).    General Exam:   Constitutional: Appears well-developed, no acute distress.   Cardiovascular: Normal rate, regular rhythm, normal heart sounds and intact distal pulses.  Exam reveals no gallop and no friction rub.  No murmur heard.  Pulmonary/Chest: Effort normal and breath sounds normal.   Abdominal: Soft. Bowel sounds are normal.       Neurologic Exam:     Mental status  Alert, oriented to time, place, person. Normal speech, Good background knowledge, attention normal.       CN   II Pupil equal round and reactive bilaterally. Visual field intact   III, IV, VI EOMI   V facial sensation symmetric to light touch, pin prick   VII R facial droop noted   VIII Hearing intact to finger rub bilaterally   IX, X Palate rise symmetrically, gag reflex intact   XI shoulder shrug symmetrically.   XII tongue in midline      Motor R/L  Pronator drift present on R  Deltoid 4+/5                    Biceps 5/5  Triceps 5/5  Wrist flexor 5/5  Wrist extensor 5-/5  Grip 4/5    Hip flexor 5/5  Knee extensor 5/5  Knee flexor 5/5  Ankle Dorsi flexor 5/5  Ankle Plantar flexor 5/5    Sensory  Light touch, PIn prick, vibration, proprioreception intact and symmetrical bilaterally.     DTR   Biceps 2/2  Brachioradialis 2/2  Triceps 2/2  Patellar 2/3  Ankle 2/2  Babinski down/upgoing    Cerebellar  FNF intact without dysmetria or tremor    Stroke Scale: 4    Lab Results   Component Value Date    GLUCOSE 85 07/29/2015     BUN 12 07/29/2015    CO2 24 07/29/2015    CREATININE 0.94 07/29/2015    K  3.9 07/29/2015    NA 138 07/29/2015    CL 106 07/29/2015    CALCIUM 9.2 07/29/2015        Lab Results   Component Value Date    WBC 7.2 07/29/2015    HGB 15.3 07/29/2015    HCT 46.1 07/29/2015    MCV 94.4 07/29/2015    PLT 145 07/29/2015           Lab Results   Component Value Date    INR 1.1 07/28/2015        Lab Results   Component Value Date    HGBA1C 5.7 07/28/2015       Lab Results   Component Value Date    CHOLTOT 188 07/29/2015    TRIG 51 07/29/2015    HDL 55* 07/29/2015    LDL 161 07/29/2015        No results found for: LABPROT        ECG: sinus rhythm  Echo w/ bubble: EF 60-65% grade 1 diastolic dysfunction no evidence of PDA      Imaging:  Ct head w/o contrast 6/30 pre-tPA:  1.?? Hyperdense vessel sign suspicious for thrombus within a left M3 branch. ??  2.?? No acute hemorrhage or mass effect.      CT Head w/o Contrast 6/30 following tPA:  ??  Small focus of low-attenuation in the right anterior limb internal capsule, not well seen previously. An evolving lacunar infarct could be considered, but would not explain the previous history of right upper/lower extremity weakness and aphasia. A definite evolving left MCA distribution infarct is not visible, but MR may be helpful for further characterization if clinically indicated.      ASSESSEMENT and PLAN:   EDWEN MCLESTER is a 60 y.o. year old male who  has a past medical history of Hypertension; Diabetes mellitus; COPD (chronic obstructive pulmonary disease); GERD (gastroesophageal reflux disease); Hepatitis; and Dysphagia. who presented with acute L ischemic MCA stroke s/p tPA.    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  - prior to tPA admission stroke scale was 11  - no evidence of hemorrhage following tPA administration  - Risk factors for small vessel: HTN, DM (A1c=5.7), smoker, LDL= 123  - TSH from 2015 was normal, will repeat TSH  - ECG was negative for atrial fibrillation, Echo did  not show any evidence of thrombus  - will restart ASA, no evidence of hemorrhagic conversion and >24h from tPA administration  - Will do a urine drug screen as patient has a history of substance abuse  - PT/OT eval pending     # HTN  - Goal SBP< 180 for now  - Started valsartan today, will continue to monitor for the first 24 hours with tighter management tomorrow    #T2DM  - on RISS, with home metformin  - continue to monitor blood glucose      Disposition: Pending Pt/OT      Mikael Spray, MD  PGY-1    Teaching Physician Consultation Note  Summary of Key Elements:  Hx: (see HO Rubin's notes for [facility], ROS and exam in detail)   [name] is a 60 y.o. year old who  has a past medical history of Hypertension; Diabetes mellitus; COPD (chronic obstructive pulmonary disease); GERD (gastroesophageal reflux disease); Hepatitis; and Dysphagia. who presented to the ED yesterday complaining of R sided weakness, in both the upper and lower extremity. He also states that at the time of onset he  was also having difficulty speaking.Last known normal was 11:30 on 6/30. At 12:15pm was found to be slurring speech and unable to move his R side.  A CT head in the ED displayed evidence of acute ischemic left MCA stroke, with a  Stroke scale of 11. The patient was found to be within the time window and was given tPA in the ED between 1400-1500 in addition to a nicardipine drip due to SBPs in 180 prior to being transferred to the NSICU.    Patient last seen normal at: Date:0630/2017?? 1130  Patient Arrived to ED at:1300  Stroke Team contacted at: (already in ED)  Arrival of team to ED: already in ED  Review of head CT by me or radiology:1307  Time of t-PA bolus:1326  Door to needle time:26 minutes    A repeat head CT showed an evolving ischemic stroke without hemorrhage.    NINDS post treatment was 4.    Smoker and hypertensive.      PE:  Blood pressure 163/103, pulse 68, temperature 97.9 ??F (36.6 ??C), temperature source Oral, resp.  rate 16, height 5' 11 (1.803 m), weight 168 lb (76.204 kg), SpO2 98 %.    Normal exam including RAMs, strength, CN, sensation, and mentation except for subjective difference to sensation on right arm. NIHSS of 1.    MRI:  Acute infarct in posterior limb of L internal capsule which also is periventricular on upper cut.  Likely secondary to small vessel occlusion.  MRI Head limited WO contrast   Final Result   IMPRESSION:      Small acute deep white matter infarct in the left posterior frontal lobe.      Report Verified by: Patriciaann Clan, M.D. at 07/29/2015 11:14 AM EDT      CT Head WO contrast   Final Result   IMPRESSION:      Small focus of low-attenuation in the right anterior limb internal capsule, not well seen previously. An evolving lacunar infarct could be considered, but would not explain the previous history of right upper/lower extremity weakness and aphasia. A definite evolving left MCA distribution infarct is not visible, but MR may be helpful for further characterization if clinically indicated.         Report Verified by: Veto Kemps, M.D. at 07/28/2015 6:52 PM EDT      CT Angio Head W and or WO   Final Result   IMPRESSION:      CTA Head:   Decreased contrast opacification within a left M3 branch vessel. No major vessel occlusion.       CTA Neck:   1.  No major vessel occlusion.   2.  Nonspecific 1 cm lobular low density lesion in the right parotid gland.   3.  Emphysema.       Approved by Wilmer Floor on 07/28/2015 3:39 PM EDT      I have personally reviewed the images and I agree with this report.      Report Verified by: J. Laurita Quint, M.D. at 07/28/2015 3:43 PM EDT      CT Angio Neck W and or WO   Final Result   IMPRESSION:      CTA Head:   Decreased contrast opacification within a left M3 branch vessel. No major vessel occlusion.       CTA Neck:   1.  No major vessel occlusion.   2.  Nonspecific 1 cm lobular low density lesion in the right parotid gland.  3.  Emphysema.       Approved  by Wilmer Floor on 07/28/2015 3:39 PM EDT      I have personally reviewed the images and I agree with this report.      Report Verified by: J. Laurita Quint, M.D. at 07/28/2015 3:43 PM EDT      X-ray chest AP portable   Final Result   IMPRESSION:      Clear lungs.      Report Verified by: Odie Sera, M.D. at 07/28/2015 2:09 PM EDT      Code Stroke-CT Head WO contrast   Final Result   IMPRESSION:       1.  Hyperdense vessel sign suspicious for thrombus within a left M3 branch.    2.  No acute hemorrhage or mass effect.      Aspect score: 10      Critical Value: Findings concerning for acute thrombus in a left M3 branch vessel  This finding was discussed with Newton Pigg on 07/28/2015 at 1340 hours by Harsha Nalluri.  They confirmed that they understood the findings communicated to them.  #[id number]#      Approved by Wilmer Floor on 07/28/2015 1:48 PM EDT      I have personally reviewed the images and I agree with this report.      Report Verified by: J. Laurita Quint, M.D. 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  I am not convinced about possible M2 occlusion.  2) HTN  3) Smoker    Plan:  1) Stroke protocol  2) Aspirin  3) BP control  4) Smoking cessation counseling.    Stroke patient: Yes tPA given: Yes  DVT Prophylaxis: Yes  Dysphagia Screening:  Yes  PT/OT, Rehab Assessment: Yes  Smoking Cessation Counseling: Yes  Stroke Education: Yes  Antithrombotic by end of day 2: Yes  LDL > 100/not measured, rx with statin: Yes  Discharge on antithrombotic tx: Yes  Discharge on anticoagulation for afib: No, reason - no afib    On this day I saw, examined and was physically present for the key portions of the services provided. I agree with the resident's plan and notes except for those listed on my note.    Aurea Graff  07/30/2015  Contact #: [phone]

## 2015-07-29 NOTE — Unmapped (Signed)
Speech Language Pathology   Reason Patient Not Seen         Name: [name]  DOB: March 16, 1955  Attending Physician: Verita Lamb, MD  Admission Diagnosis: Acute ischemic stroke [I63.9]  Date: 07/29/2015  Precautions: Aspiration precautions, fall risk    Reviewed Pertinent hospital course: Yes    Unable to see patient due to :Unavailable Echo in progress at bedside.    Will f/u as able for Swallow evaluation.    Ardeen Fillers, M.A., CCC-SLP  Speech-Language Pathologist  MBSImP Certified Clinician  Pager: [phone]  Office: [phone]  M/W/: 7:00 TO 2:30  F:  7:00 TO 1:30

## 2015-07-30 LAB — CBC
Hematocrit: 44.3 % (ref 38.5–50.0)
Hemoglobin: 14.8 g/dL (ref 13.2–17.1)
MCH: 31.4 pg (ref 27.0–33.0)
MCHC: 33.4 g/dL (ref 32.0–36.0)
MCV: 93.9 fL (ref 80.0–100.0)
MPV: 9.1 fL (ref 7.5–11.5)
Platelets: 135 10*3/uL (ref 140–400)
RBC: 4.71 10*6/uL (ref 4.20–5.80)
RDW: 13.9 % (ref 11.0–15.0)
WBC: 6.8 10*3/uL (ref 3.8–10.8)

## 2015-07-30 LAB — BASIC METABOLIC PANEL
Anion Gap: 6 mmol/L (ref 3–16)
BUN: 22 mg/dL (ref 7–25)
CO2: 26 mmol/L (ref 21–33)
Calcium: 9.2 mg/dL (ref 8.6–10.3)
Chloride: 105 mmol/L (ref 98–110)
Creatinine: 1.08 mg/dL (ref 0.60–1.30)
Glucose: 99 mg/dL (ref 70–100)
Osmolality, Calculated: 287 mOsm/kg (ref 278–305)
Potassium: 4.2 mmol/L (ref 3.5–5.3)
Sodium: 137 mmol/L (ref 133–146)
eGFR AA CKD-EPI: 86 See note.
eGFR NONAA CKD-EPI: 74 See note.

## 2015-07-30 LAB — POC GLU MONITORING DEVICE
POC Glucose Monitoring Device: 104 mg/dL (ref 70–100)
POC Glucose Monitoring Device: 90 mg/dL (ref 70–100)
POC Glucose Monitoring Device: 96 mg/dL (ref 70–100)
POC Glucose Monitoring Device: 89 mg/dL (ref 70–100)
POC Glucose Monitoring Device: 103 mg/dL (ref 70–100)

## 2015-07-30 LAB — MAGNESIUM: Magnesium: 2.2 mg/dL (ref 1.5–2.5)

## 2015-07-30 LAB — PHOSPHORUS: Phosphorus: 4.6 mg/dL (ref 2.1–4.7)

## 2015-07-30 MED ORDER — hydroCHLOROthiazide (MICROZIDE) capsule 12.5 mg
12.5 | Freq: Every day | ORAL | Status: AC
Start: 2015-07-30 — End: 2015-07-31
  Administered 2015-07-30 – 2015-07-31 (×2): 12.5 mg via ORAL

## 2015-07-30 MED FILL — OXYCODONE 5 MG TABLET: 5 5 MG | ORAL | Qty: 2

## 2015-07-30 MED FILL — ATORVASTATIN 80 MG TABLET: 80 80 MG | ORAL | Qty: 1

## 2015-07-30 MED FILL — NICOTINE 14 MG/24 HR DAILY TRANSDERMAL PATCH: 14 14 mg/24 hr | TRANSDERMAL | Qty: 1

## 2015-07-30 MED FILL — VALSARTAN 80 MG TABLET: 80 80 MG | ORAL | Qty: 1

## 2015-07-30 MED FILL — HYDROCHLOROTHIAZIDE 12.5 MG CAPSULE: 12.5 12.5 mg | ORAL | Qty: 1

## 2015-07-30 MED FILL — ASPIRIN 81 MG TABLET,DELAYED RELEASE: 81 81 MG | ORAL | Qty: 1

## 2015-07-30 MED FILL — HEPARIN (PORCINE) 5,000 UNIT/ML INJECTION SOLUTION: 5000 5,000 unit/mL | INTRAMUSCULAR | Qty: 1

## 2015-07-30 NOTE — Unmapped (Signed)
Inpatient Physical Therapy   Initial Assessment    Name: [name]  DOB:March 24, 1955  Attending Physician: Aurea Graff, MD  Admitting Diagnosis: Acute ischemic stroke [I63.9]  Ischemic stroke [I63.9]  Date: 07/30/2015  Room: 2956/O1308    Hospital Course PT/OT: Presents c R sided weakness and dysarthria; reports he is unable to more R UE; HCT(6/30) hyperdense vessel sign concern for thrombus; no hemorrhage or mass; tPA administered 1:15pm; HCT(6/30) decreased opacification L M3 branch, no vessel occlusion; HCT(6/30) small low attenuation R ant limb internal capsule, possible lacunar infarct but would not explain symptoms; definite evolving L MCA not visible; MRI Head (6/30): Small acute deep white matter infarct in the left posterior frontal lobe; PMH significant for HTN, NIDDM, COPD, GERD; hep; OT/PT(6/30) AAT, no limitations    Activity level: activity as tolerated  Precautions: N/A      Assessment:  Patient presents with impairments including balance and gait.  Pt demonstrates mild balance deficits and gait deviations but progresses to performing all mobility without assistance despite mild R LE deficits.      Recommendations:   Recommend: home with prn assistance and outpatient PT    AM-PAC 6 Clicks Basic Mobility Inpatient Short Form: PT 6 Clicks Score: 24     Equipment recommendations: none      Mobility Recommendations for Staff:  Patient ambulates in hallway with 1 person assist 2/2 fall risk score      Preadmission Environment:  Patient lives with his significant other and has some (not 24 hour) assistance available at discharge.    Patient lives in a house.   -- 8 stairs to enter with railing on one side  -- At least one flight of stairs within home    --Bedroom located MV:HQIONG  --Bathroom located EX:BMWUX and second (walk-in shower)    Patient has the following equipment: single-point cane      Prior Level of Function:  Information per patient:  --Patient was using no assistive device for  functional mobility.  --Prior to hospital admission the patient needed assistance with nothing.   --Patient received assistance from no one.  --Pt works doing Youth worker.      Present Cognitive Status:  Patient is oriented X 4.  Patient is alert, appropriate, cooperative and apprehensive.  Patient is able to follow all commands.       Pain:  Patient reports pain at 8/10 in back and pain is described as chronic but worse than baseline  Pain interventions: repositioned, activity increased and activity modified to pain tolerance      Vision/Perception:  Grossly WFL      Upper Extremity Function:  Not tested- defer to OT      Lower Extremity Function:     Gross Muscle Test R Gross Muscle Test L ROM limitations   Hip flexion 5/5 5/5 WFL   Knee flexion NT NT    Knee extension 4+/5 5/5    Ankle dorsiflexion 5/5 5/5            Sensation: not formally tested    Muscle Tone: normal    Motor Control: normal      Edema:   none      Functional Mobility:  Sit to stand = Patient transfers from sit to stand with supervision and wide BOS  Gait = Patient ambulates 300 ft. with contact guard assistance improving to supervision using no assistive device . Gait deviations include B decreased step length and cadence, slightly wide BOS, B decreased  arm swing.  Stairs = Patient ascends/descends 20 stairs with railing on one side using no assistive device independently and reciprocal gait pattern       Balance:  Static sitting = performs independently  Dynamic sitting = performs independently  Static standing = performs independently using no assistive device  Dynamic standing = performs with supervision using no assistive device      Positioning:  Patient left in chair at end of session, nurse notified, visitor(s) present and call light/ needs left within reach. Chair alarm activated and not able to be interfaced (patient in ICU).       Goals:  To be met by: 08/06/2015    Gait = Patient will ambulate 500 ft. independently and narrow BOS  using no assistive device  Balance = Patient will tolerate assessment with objective balance tool    Long-term goal (to be met by 08/06/2015): =STG  Patient stated goals: to go home and to return to baseline/PLOF    Rehabilitation Potential (for above goals): good   Strengths: PLOF, motivated    Barriers: apprehension    Above goals discussed with patient and pt's significant other -- Yes.       Patient/Family Education:  Educated patient and pt's significant other on the role of physical therapy, goals, plan of care, importance of increased activity, discharge recommendations and gait training and fall prevention strategies, including need for supervision/ assistance with OOB activity and use of call light; patient verbalized understanding and demonstrated understanding. Handout(s) issued: none.      Plan:  Patient to be seen at least 1  time(s) per week to address above deficits with therapeutic exercise, functional mobility, therapeutic activity, transfer training, gait training, stair training, balance training, neuromuscular re-education, patient/family education and equipment evaluation/education.    The plan of care and recommendations assesses the patient's and/or caregiver's readiness, willingness, and ability to provide or support functional mobility and ADL tasks as needed upon discharge.      Signed:    Aura Camps, PT, DPT  Physical Therapist  Pager: [phone]  Department: [phone]  Schedule: Monday to Friday 8:00 to 4:30      Patient Class: Inpatient      Start Time: 1015    Stop Time: 1030   Time Calculation (min): 15 min    Units Rendered:   $PT Evaluation Low Complex 20 Min: 1 Procedure                   PMH:   Past Medical History   Diagnosis Date   ??? Hypertension    ??? Diabetes mellitus    ??? COPD (chronic obstructive pulmonary disease)    ??? GERD (gastroesophageal reflux disease)    ??? Hepatitis    ??? Dysphagia      PSH:   Past Surgical History   Procedure Laterality Date   ??? Foot surgery Left    ???  Esophagogastroduodenoscopy N/A 11/19/2013     Procedure: ESOPHAGOGASTRODUODENOSCOPY WITH MAC;  Surgeon: Charlane Ferretti, MD;  Location: [facility] ENDOSCOPY;  Service: Gastroenterology;  Laterality: N/A;   ??? Hernia repair       about 5 yrs ago

## 2015-07-30 NOTE — Unmapped (Signed)
Occupational Therapy  Initial Assessment  Discharge    Name: [name]  DOB:1955-03-15  Attending Physician: Aurea Graff, MD  Admitting Diagnosis: Acute ischemic stroke [I63.9]  Ischemic stroke [I63.9]  Date: 07/30/2015  Room: 1610/R6045    Hospital Course PT/OT: Presents c R sided weakness and dysarthria; reports he is unable to more R UE; HCT(6/30) hyperdense vessel sign concern for thrombus; no hemorrhage or mass; tPA administered 1:15pm; HCT(6/30) decreased opacification L M3 branch, no vessel occlusion; HCT(6/30) small low attenuation R ant limb internal capsule, possible lacunar infarct but would not explain symptoms; definite evolving L MCA not visible; MRI Head (6/30): Small acute deep white matter infarct in the left posterior frontal lobe; PMH significant for HTN, NIDDM, COPD, GERD; hep; OT/PT(6/30) AAT, no limitations    Activity Level: activity as tolerated  Precautions: no limitations       Recommendations  Recommend: Home with PRN assist     AM-PAC 6 Clicks Daily Activity Inpatient Short Form: OT 6 Clicks Score: 24     Equipment recommendations: None    Preadmission Environment  Patient lives with his significant other  and has some (not 24 hour) assistance available at discharge.    Patient lives in a house.   8 steps to enter from outside with railing on one side   Two story home with bedroom and bathroom upstairs   1/2 bath available on main level   Walk in shower   Patient has the following equipment: single-point cane.    Prior Level of Function/Occupational Profile  Information per patient.  Patient was using no assistive device for functional mobility.  Prior to hospital admission the patient needed assistance with nothing. 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    Pain  Patient reports pain at 8/10 in back; described as chronic .  Pain interventions: repositioned and activity increased.    Cognitive Status  Patient is oriented X 4.  Patient is alert,  appropriate and cooperative.  Patient is able to follow all commands.      Cognition/Perception   Functional Impaired Comments   Memory X       Problem Solving X       Judgment X       Insight X       Communication X       Vision X           Upper Extremity Function    Patient's UE function is grossly WFL for ADL completion       Hand Function Functional Impaired Comments   Gross Grasp X     Coordination X         Muscle Tone: normal    Sensation: Intact    Motor Control: Intact    Splints: n/a       ADLs and Functional Mobility  Sit to stand: Supervision  Chair Transfer: Supervision  Feeding: set up with tray   LE ADLs: Supervision and to don pants      Balance  Static Sitting: Independent  Dynamic Sitting: Independent  Static Standing: Supervision  Dynamic Standing: initial CGA to walk in hall; progressing to supervision with no device       Positioning: Patient left in chair at end of session, visitor(s) present, nurse present and call light/ needs left within reach. Chair alarm activated and interfaced with call system.    Assessment/Goals/Plan  Pt with no skilled acute occupational therapy needs, therefore no occupational  therapy goals were established. Discharge patient from inpatient occupational therapy.       Pt stated goal to go home.    Patient/Family Education  Educated patient on the role of occupational therapy, OT goals, OT plan of care and discharge recommendation and fall prevention strategies including use of call light. patient  verbalized understanding and will need reinforcement.      The plan of care assesses the patient's and/or caregiver's readiness, willingness, and ability to provide or support functional mobility and ADL tasks as needed upon discharge.      Daneil Dan, MOT  [employer]   Pager: [phone]  Department Phone: [phone]    Patient Class: Inpatient      Time   Start Time: 1016  Stop Time: 1030  Time Calculation (min): 14 min    Charges   $OT Evaluation Low Complex 30 Min: 1 Procedure                PMH:   Past Medical History   Diagnosis Date   ??? Hypertension    ??? Diabetes mellitus    ??? COPD (chronic obstructive pulmonary disease)    ??? GERD (gastroesophageal reflux disease)    ??? Hepatitis    ??? Dysphagia      PSH:   Past Surgical History   Procedure Laterality Date   ??? Foot surgery Left    ??? Esophagogastroduodenoscopy N/A 11/19/2013     Procedure: ESOPHAGOGASTRODUODENOSCOPY WITH MAC;  Surgeon: Charlane Ferretti, MD;  Location: [facility] ENDOSCOPY;  Service: Gastroenterology;  Laterality: N/A;   ??? Hernia repair       about 5 yrs ago

## 2015-07-31 LAB — POC GLU MONITORING DEVICE
POC Glucose Monitoring Device: 127 mg/dL — ABNORMAL HIGH (ref 70–100)
POC Glucose Monitoring Device: 95 mg/dL (ref 70–100)
POC Glucose Monitoring Device: 98 mg/dL (ref 70–100)

## 2015-07-31 MED ORDER — aspirin 81 MG EC tablet
81 | ORAL_TABLET | Freq: Every day | ORAL | 1.00 refills | 30.00000 days | Status: AC
Start: 2015-07-31 — End: 2021-04-06

## 2015-07-31 MED ORDER — hydroCHLOROthiazide (MICROZIDE) 12.5 mg capsule
12.5 | ORAL_CAPSULE | Freq: Every day | ORAL | 0.00 refills | 90.00000 days | Status: AC
Start: 2015-07-31 — End: 2015-07-31

## 2015-07-31 MED ORDER — atorvastatin (LIPITOR) 80 MG tablet
80 | ORAL_TABLET | Freq: Every evening | ORAL | Status: AC
Start: 2015-07-31 — End: ?

## 2015-07-31 MED ORDER — hydroCHLOROthiazide (MICROZIDE) 12.5 mg capsule
12.5 | ORAL_CAPSULE | Freq: Every day | ORAL | 0.00 refills | 90.00000 days | Status: AC
Start: 2015-07-31 — End: 2021-03-21

## 2015-07-31 MED ORDER — losartan (COZAAR) 100 MG tablet
100 | ORAL_TABLET | Freq: Every day | ORAL | Status: AC
Start: 2015-07-31 — End: 2021-03-20

## 2015-07-31 MED FILL — ASPIRIN 81 MG TABLET,DELAYED RELEASE: 81 81 MG | ORAL | Qty: 1

## 2015-07-31 MED FILL — HEPARIN (PORCINE) 5,000 UNIT/ML INJECTION SOLUTION: 5000 5,000 unit/mL | INTRAMUSCULAR | Qty: 1

## 2015-07-31 MED FILL — OXYCODONE 5 MG TABLET: 5 5 MG | ORAL | Qty: 2

## 2015-07-31 NOTE — Unmapped (Signed)
Reviewed the personalized Comprehensive Stroke Booklet for ischemic stroke discussing area of the stroke and his risk factors; HTN, DM, Smoking, HLD. Patient is willing to cease smoking and understands the benefits. Discussed taking BP medications and seeing his PCP (Dr. Darrow Bussing) regularly for good health maintenance. Provided the scheduled stroke follow up appt to the patient and entered into the AVS. Reviewed FAST and calling 911.

## 2015-07-31 NOTE — Unmapped (Signed)
Per order, pt discharged, PIVs removed, discharge instructions reviewed with understanding verbalized. Pt discharged in stable condition.

## 2015-07-31 NOTE — Unmapped (Addendum)
1. Cont. atorvastatin 80mg   2. Conti HCTZ 12.5 mg daily and continue home losartan   3. Stop clonidine   4. F/U with Neurology and PCP as scheduled

## 2015-07-31 NOTE — Unmapped (Signed)
SW participated in multidisciplinary rounds with neuro team.  Plan is for possible discharge to home today.  PT/OT have cleared pt for home with prn assist and outpt PT.  No SW intervention indicated.  Addison Naegeli MSW,LSW [phone]

## 2015-07-31 NOTE — Unmapped (Signed)
[facility]  Department of Neurology  Inpatient Discharge Summary    Patient: [name]   MRN: [id number]   CSN: [id number]    Date of Admission: 07/28/2015  Date of Discharge: 07/31/2015   Attending Physician: Aurea Graff, MD       Diagnoses Present on Admission     Past Medical History   Diagnosis Date   ??? Hypertension    ??? Diabetes mellitus    ??? COPD (chronic obstructive pulmonary disease)    ??? GERD (gastroesophageal reflux disease)    ??? Hepatitis    ??? Dysphagia           Discharge Diagnoses     Active Hospital Problems    Diagnosis Date Noted   ??? Ischemic stroke [I63.9] 07/28/2015      Resolved Hospital Problems    Diagnosis Date Noted Date Resolved   No resolved problems to display.         Operations/Procedures Performed (include dates)     ?? CT head without contrast 07/28/2015 1:31 PM EDT.  ??  Indication: Code stroke  ??  Comparison:?? CT head without contrast 04/22/2013, CT head without contrast 03/01/2004  ??  Technique: Noncontrast axial CT images are obtained from the skull base to the vertex. Images were to a slice thickness of 5 mm and a field-of-view of 22 cm. ??  ??  Findings:  ??  The ventricles are normal in size and configuration. There is a hyperdense vessel in the region of the left insular cistern (series 2 image 17). Preliminary review of the CTA axial maps demonstrates decreased contrast opacification within a vessel in this location, likely a M3 branch.?? There is no evidence of acute hemorrhage or mass effect. No extra-axial fluid is seen. The paranasal sinuses are clear. The globes and orbits are unremarkable. ??  ??  IMPRESSION: ??  ??  1.?? Hyperdense vessel sign suspicious for thrombus within a left M3 branch. ??  2.?? No acute hemorrhage or mass effect.  ??     CTA Head and neck (07/28/2015)  CTA Head:  Decreased contrast opacification within a left M3 branch vessel. No major vessel occlusion. ??  ??CTA Neck:  1.?? No major vessel occlusion.  2.?? Nonspecific 1 cm lobular low  density lesion in the right parotid gland.  3.?? Emphysema. ??     tPA administered 07/28/15    CT Head w/o contrast (07/28/15):  ??Small focus of low-attenuation in the right anterior limb internal capsule, not well seen previously. An evolving lacunar infarct could be considered, but would not explain the previous history of right upper/lower extremity weakness and aphasia. A definite evolving left MCA distribution infarct is not visible, but MR may be helpful for further characterization if clinically indicated.  ??    Consulting Services (include reason)     None    Allergies     Penicillins      Discharge Medications     Current Discharge Medication List      START taking these medications    Details   aspirin 81 MG EC tablet Take 1 tablet (81 mg total) by mouth daily.  Qty: 30 tablet, Refills: 0      atorvastatin (LIPITOR) 80 MG tablet Take 1 tablet (80 mg total) by mouth at bedtime.  Qty: 30 tablet, Refills: 0      hydroCHLOROthiazide (MICROZIDE) 12.5 mg capsule Take 1 capsule (12.5 mg total) by mouth daily.  Qty: 30 capsule, Refills:  3         CONTINUE these medications which have NOT CHANGED    Details   ALBUTEROL INHL Inhale into the lungs.      amLODIPine (NORVASC) 10 MG tablet Take 10 mg by mouth daily      blood sugar diagnostic Strp Test daily      blood-glucose meter Misc by Does not apply route.      buPROPion SR (WELLBUTRIN SR) 100 MG tablet Take 100 mg by mouth 2 times daily Ran out of prescription      entecavir (BARACLUDE) 0.5 MG tablet Take 1 tablet (0.5 mg total) by mouth daily.  Qty: 30 tablet, Refills: 2    Associated Diagnoses: Hepatitis B infection without delta agent without hepatic coma, unspecified chronicity      FLUoxetine (PROZAC) 20 MG capsule       ibuprofen (ADVIL,MOTRIN) 800 MG tablet Take 0.5 tablets (400 mg total) by mouth every 6 hours as needed for up to 16 doses.  Qty: 16 tablet, Refills: 0      ketoconazole (NIZORAL) 2 % cream       loratadine 10 mg Cap Take 1 tablet by mouth  daily      losartan (COZAAR) 100 MG tablet Take 100 mg by mouth daily      metFORMIN (GLUCOPHAGE) 500 MG tablet Take 500 mg by mouth 2 times a day with meals.      ondansetron (ZOFRAN ODT) 4 MG disintegrating tablet Take 1 tablet (4 mg total) by mouth every 6 hours as needed for Nausea.  Qty: 16 tablet, Refills: 0      orphenadrine (NORFLEX) 100 mg tablet Take 1 tablet (100 mg total) by mouth 2 times a day.  Qty: 10 tablet, Refills: 0      oxyCODONE (ROXICODONE) 5 MG immediate release tablet Take 1 tablet (5 mg total) by mouth every 4 hours as needed for Pain.  Qty: 30 tablet, Refills: 0      oxyCODONE-acetaminophen (PERCOCET) 10-325 mg per tablet 1 tablet every 4 hours as needed.         tamsulosin (FLOMAX) 0.4 mg Cp24 Take 0.4 mg by mouth daily      traZODone (DESYREL) 100 MG tablet          STOP taking these medications       cloNIDine (CATAPRES) 0.2 mg/24 hr patch Comments:   Reason for Stopping:                 Reason for Admission     [name] is a 60 y.o. male with PMH of HTN, DM, COPD, and hepatitis  who presented with R sided upper and lower extremity weakness and found to have an acute ischemic L MCA stroke on head CT.     Hospital Course     Active Hospital Problems    Diagnosis Date Noted   ??? Ischemic stroke [I63.9] 07/28/2015      Resolved Hospital Problems    Diagnosis Date Noted Date Resolved   No resolved problems to display.       The stroke team had been activated prior to patient arrival in the ED. A CT without contrast performed in the ED showed that the patient had an acute ischemic L MCA stroke. He was found to be within the time window for tPA, and was given tPA in the ED with a door to needle time of 26 minutes. On presentation, the patient had a SBP in the  180s and he was started on a nicardipine drip prior to being transferred to the NSICU. The patient's NIHSS score prior to tPA administration was 11. A repeat head CT performed later on 6/30 did not show any evidence of hemorrhage.  Following tPA administration, the patient's NIHSS improved to a score of 4.    The patient had an ECG and cardiac echo without any apparent evidence of thrombosis or atrial fibrillation. The patient was started on aspirin after he was >24h from tPA administration. His BP remained elevated with SBP in the 160s. The patient was started on 80mg  of valsartan daily and 12.5mg  of HCTZ daily for better blood pressure control. Patient was started on atorvastatin 80mg  daily.     Throughout his hospital course the patient improved significantly with his condition, with strength that was equal bilaterally prior to discharge. At the time of discharge he still stated that there was decreased sensation on the R upper extremity. F/U with PT?OT outpatient and neurology as scheduled.     Condition on Discharge     1. Functional Status: mildly impaired   Describe limitations, if any: mild sensory loss right upper extremity    2. Mental Status: Alert/Oriented   Describe limitations, if any:     3. Dietary Restrictions / Tube Feeding / TPN: No   If yes, describe:     4. Supplemental Oxygen / Ventilation / CPAP / Bi-Level: No   If yes, describe settings/delivery:    Change from baseline?: unchanged    5. In-dwelling Lines or Catheters: No   If yes, describe:         Neurologic Exam:     Mental status  Alert, oriented to time, place, person. Normal speech, Good background knowledge, attention normal.       CN  ??II Pupil equal round and reactive bilaterally. Visual field intact  ??III, IV, VI EOMI  ??V facial sensation symmetric to light touch, pin prick  ??VII R facial droop noted  ??VIII Hearing intact to finger rub bilaterally  ??IX, X Palate rise symmetrically, gag reflex intact  ??XI shoulder shrug symmetrically.  ??XII tongue in midline      Motor R/L  Pronator drift present on R  Deltoid 4+/5?????????????????????????????????? ??  Biceps 5/5  Triceps 5/5  Wrist flexor 5/5  Wrist extensor 5-/5  Grip 4/5    Hip flexor 5/5  Knee extensor 5/5  Knee flexor  5/5  Ankle Dorsi flexor 5/5  Ankle Plantar flexor 5/5    Sensory  Light touch, PIn prick, vibration, proprioreception intact and symmetrical bilaterally.     DTR   Biceps 2/2  Brachioradialis 2/2  Triceps 2/2  Patellar 2/3  Ankle 2/2  Babinski down/upgoing    Cerebellar  FNF intact without dysmetria or tremor      Disposition     Home with PRN assistance      Follow-Up Appointments     Future Appointments  Date Time Provider Department Center   11/16/2015 9:30 AM Aurea Graff, MD [facility]         Specific Follow-Up Items for Receiving Physician     1. Cont. atorvastatin 80mg   2. Izell Carolina. valsartan 80mg  daily and HCTZ 12.5 mg daily  3. Stop clonidine and losartan   4. F/U with Neurology and PCP as scheduled     Signed:    JUSTIN GORANOVICH  07/31/2015, 12:48 PM       On this day I saw, examined, and was physically  present for the key portions of the services provided. I agree with the resident's plan and notes except for those listed on my note.    Aurea Graff

## 2015-08-08 NOTE — Unmapped (Signed)
Stroke Follow Up Phone Call:  Spoke with patient as a follow up to stroke hospital admission.  Reviewed discharge medication list - endorses taking all medications as prescribed  Started taking Chantix for smoking cessation  Outpatient PT/OT scheduled  Patient states experiencing memory loss  PCP routed inpatient discharge summary  Patient to follow up with PCP on 08/09/15  Aware of Neurology follow up with Dr. Orlin Hilding  Reviewed FAST and when to call 911

## 2015-08-09 ENCOUNTER — Encounter: Admit: 2015-08-09 | Discharge: 2015-08-09 | Payer: Worker's Compensation

## 2015-08-09 DIAGNOSIS — I639 Cerebral infarction, unspecified: Secondary | ICD-10-CM

## 2015-08-09 NOTE — Unmapped (Signed)
No show to eval appt

## 2015-08-09 NOTE — Unmapped (Signed)
Occupational Therapy Initial Evaluation  Name: [name]  Date of Birth: [birth year]  MRN: [id number]    Date of evaluation: 08/09/2015  Specific Order: evaluate and treat    Date of Onset: June 30,2017    Primary Medical Diagnosis:   1. Right arm weakness s/p CVA        Insurance: Payor: [employer] / Plan: [facility] COMMUNITY HEALTH / Product Type: Medicaid [place] care /   07.10.17/AU/NNB/[id number]/BENEFIT AUTH INFO  PT,OT,ST- Restaurant manager, fast food COMMUNITY HEALTH MEDICAID [place]  EFF DATE: July   IN NETWORK   CO-INS: 100%  NO YRLY /LTM/ PRE-EXISTING   PT GETS 30 V EACH PT/OT/ST PER CAL YR, USED 0  THEN PRECERT IS REQUIRED EVEN FOR EVALS  PER VALERIE   REF# [id number]  [facility] [phone]  Insurance Precert Necessary:  []  yes; EVAL ONLY    [x]  no      History and Reason for Referral:: [name] is a 60 y.o. male who presents today with chief complaint right side weakness, memory issues. 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 He is wearing a splint to stabilize left small finger during OT eval. States his pain was out of control after smashing his finger and his blood pressure was running very high. He sustained a stroke 3 days after smashing his finger. Reports right arm weakness, decreased activity tolerance and visual/cognitive deficits.   PMH/PSH:   Past Medical History   Diagnosis Date   ??? Hypertension    ??? Diabetes mellitus    ??? COPD (chronic obstructive pulmonary disease)    ??? GERD (gastroesophageal reflux disease)    ??? Hepatitis    ??? Dysphagia    ,   Past Surgical History   Procedure Laterality Date   ??? Foot surgery Left    ??? Esophagogastroduodenoscopy N/A 11/19/2013     Procedure: ESOPHAGOGASTRODUODENOSCOPY WITH MAC;  Surgeon: Charlane Ferretti, MD;  Location: [facility] ENDOSCOPY;  Service: Gastroenterology;  Laterality: N/A;   ??? Hernia repair       about 5 yrs ago       Medications: He  does not have any  pertinent problems on file.  He  has past surgical history that includes Foot surgery (Left); Esophagogastroduodenoscopy (N/A, 11/19/2013); and Hernia repair.  He has a current medication list which includes the following prescription(s): albuterol, amlodipine, aspirin, atorvastatin, blood sugar diagnostic, blood-glucose meter, bupropion hcl, entecavir, fluoxetine, hydrochlorothiazide, ibuprofen, ketoconazole, loratadine, losartan, metformin, ondansetron, orphenadrine, oxycodone, oxycodone-acetaminophen, tamsulosin, and trazodone.    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:    cane  Objective:   Pain:Level (0-10): 8/10 at present;  10/10 at worst;  8/10 at best   Location: left small finger, chronic back pain, neck pain        Prior Level of Function:     PLOF/ Task:   Independent Impaired Comments:    ADL's/ transfers x  Home Management x     Work x     Driving x               CLOF/ Functional ADLs:   Information obtained per (report/functional assessment):     CLOF/ Task:   INDEP Impaired Comments:    ADL's x     Home Management  x Girlfriend assisting as he is having difficulty with standing for long periods of time   Work  x Not working since 07/24/15 when he smashed his left small finger at work causing a fracture and removal of nail bed. Had stroke 3 days later.   Driving x  States he resumed driving immediately after stroke. Denies issues.   Transfers x                         Balance/ Postural Control:   - Sitting: [x]   WFL []   Impaired []   Absent []  N/T  - Standing:  []   WFL [x]   Impaired; off balance []   Absent []  N/T    BUE Strength , Range of  Motion, Grip, FMC:     Location Left ROM Left MMT Right ROM Right MMT   shoulder flexion  5/5  4/5   Elbow flexion  5/5  4/5                       Gross Grasp  60, 55; difficulty due to left finger fracture  70,80                     9-hole peg  48 sec;difficulty due to left finger fracture   22sec     Dominant Hand: [x]  right []  left  UE Splints:  []  yes    [x]  no     Sensation:  Reports numbness in right arm from pinched nerve. Reports change in sensation since CVA, states sensation is more dulled.    Left  Right   [x]  Intact []  Impaired []  absent Light Touch []  Intact [x]  Impaired []  absent   [x]  Intact []  Impaired []  absent Hot/ Cold []  Intact [x]  Impaired []  absent   [x]  Intact []  Impaired []  absent Proprioceptive []  Intact [x]  Impaired []  absent     Edema:   [x] none    [] minimal  []  moderate [] severe :                Subluxation:  []  yes      [x]  no     Cognition Status: [x]  functional for basic conversation  []  impaired attention  [x]  impaired memory; States he forgets his address, where he is going. Reports delayed processing.  []  impaired awareness/impaired insight  []  unable to assess due to communication deficits  []  further assessment needed within context of functional and therapeutic interventions     Visual Perception:  [x]   TBA; Reports changes in vision since stroke. []   Impaired []   Absent []  N/T []  wears glasses   []   Visual field deficit  []  diplopia  []  convergence/ divergence problems:      []  other    Skilled therapy services provided this date:   [x]   Education on role of OT in patient's recovery  []   Adaptive equipment recommendations with ordering info:   []   HEPs with handouts and training:  []   Energy conservation strategies  [x]   Cognitive/vision TBA next visit.    Learning Needs Assessment:    [  x] Patient is able to communicate with therapist and verbalize understanding of directions/instructions  []  Patient is unable to communicate with therapist and/or verbalize understanding of  directions/understanding due to the following barriers to learning:   []   Reading  [] Language  [] Visual  [] Hearing  []  Other:    Is an interpreter required? [] Yes   [x]  No    Assessment/Problem List: Patient presents with impairments including right UE weakness and cognitive changes.  These impairments contribute to functional limitations including dependent in HEP, decreased  Homemaking,memory issues, assess vision . Patient would benefit from skilled OT to address the aforementioned deficits. Patient/family received education on the purpose of therapy, participated in the development of the POC and verbalized and understanding and agreement of POC, goals.    Rehabilitation Potential: Based upon this therapist's assessment, SHION BLUESTEIN has the following rehabilitation potential for OT goals stated in this evaluation: excellent    Occupational Therapy Plan of Care:  Goals:    Patient/Family Goals: Improve strength , stamina and address memory and visual concerns.    Therapy Goals:    Short Term Goals to be met within 2 visits:   1. Patient will demonstrate independence with progressive HEP for RUE strengthening to address functional deficits with continued progress after discharge.          Long Term Goals to be met within 4 weeks:    1.Patient will demonstrate INDEP in home management (meals, laundry, shopping) with good safety, compensatory strategies for memory.   2.Increase strength (MMT 4/5 to 5/5, grip: increase by 10#)   3.Assess cognitive skills next session via MOCA, SBT, SDMT,TRAILS. Patient will INDEP verbalize understanding of cognitive compensatory strategies to assist with memory deficits for functional activities.     4.Assess visual perception skills via MVPT, dynavision and keystone to make appropriate recommendations for vision and community reentry.   5.Patient will be knowledgeable of community based/wellness program.         Plan:    Treatment to Include: therapeutic exercise, neuro re  education, therapeutic activity, cognitive retraining and self-care/home management     Patient/Family Agree to Treatment: Yes     Treatment Diagnosis (ICD-9 Code); 799.89     Therapy Frequency/Duration: Patient to receive skilled OT services 2 times per week for 4 weeks starting from the first scheduled follow up visit, or up to a total of 8visits within this POC.   Certification Period: 08/09/2015 - 90 days        Therapist Signature: Tressia Miners, OTR/L  Date: 08/09/2015      Physical Certification:  I certify that the above patient is under my care and requires the above services.  These professional services are to be provided from an established plan related to the diagnosis and reviewed by me every 90 days.      Physician's Name: ____________________________      Physician's Signature: __________________________ Date:   ___________           Services Provided/Billing:   CPT Qty Min Procedure   CPT Qty Procedure   Evaluation/ReEvaluation   Application of Modalities Untimed Document type/mins   97003  1   OT Evaluation   [id number]   Unattended E Stim / IFC / HVPC / MFAC   97004     OT ReEvaluation   97010   Cold Pack (document pre/post tx)   Therapeutic Procedures Each 15 Minutes   97010   Hot Pack (document pre/post tx)   [id number]  Therapeutic Exercies   [id number]   Paraffin (document pre/post tx)   97112  1  14 Neuromuscular Re-ed   97022   Whirlpool (document mins)   97760     Orthotic Training/Fitting   97150   Group Therapy (document mins)   97530     Therapeutic Activity   Other   97532     Development of Cog Skills           97035     Ultrasound           97535     Self Care           97032   Attended E Stim       97140   Manual Therapy       [phone]     Wheelchair Management

## 2015-08-09 NOTE — Unmapped (Signed)
Diagnosis:   1. Ischemic stroke             Referring Provider:System, Provider Not In    Insurance plan:   Payor/Plan Subscr DOB Sex Relation Sub. Ins. ID Effective Group Num   1. [name] [name], [name] 04/01/1955 Male  [id number] [birth year]01                                    [address]                         # of visits per insurance authorization:[place] 30 V  # of visits per POC:    Date of Initial Eval: 08/09/15     Precautions:     Activity Date:  08/09/15 Date:  Date: Date:    Visit #  1  Initial  eval               CPT code  Daily treatment record Treatment Time   PT Evaluation: 97001 Initial Evaluation         Therapeutic Activities: 97530 Pt education in PT eval findings, POC and information in anatomy & diagnosis provided.         Neuromuscular Re-education: [id number] Activities / exercise completed to strengthen core, improve trunk stability and reduce pain, to return patient to PLOF           Pt response to tx today, progress:       Total Treatment TIme:         Subj :   Pain:   /10 Location:    Additional Information:     Patient Education: (date)    Initial Summary:

## 2015-08-11 ENCOUNTER — Encounter: Admit: 2015-08-11 | Discharge: 2015-08-11 | Payer: Worker's Compensation

## 2015-08-11 DIAGNOSIS — I639 Cerebral infarction, unspecified: Secondary | ICD-10-CM

## 2015-08-11 NOTE — Unmapped (Signed)
Physical Therapy Initial Evaluation and Plan of Care  Name: [name]     Date of Birth: 06/30/1955      MRN: [id number]    Date of evaluation: 08/11/2015  Referring Physician: Ellwood Sayers, MD  [address]  [place], [place] [address] Phone: [phone] Fax:   Date of Onset: June 30,2017  Primary Medical Diagnosis:   1. Ischemic stroke       Insurance: Payor: [employer] / Plan: [employer] / Product Type: Work Occupational hygienist /     Subjective/History:   History of Current Problem/Reason for Referral: [name] is a 60 y.o. male who presents today with chief complaint of  RLE cramping sensation in R thigh , calf & RUE. Varies.  Reports hx of chronic LBP, neck pain and DDD  Per OT notes: chief complaint right side weakness, memory issues. 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 He is wearing a splint to stabilize left small finger during OT eval. States his pain was out of control after smashing his finger and his blood pressure was running very high. He sustained a stroke 3 days after smashing his finger. Reports right arm weakness, decreased activity tolerance and visual/cognitive deficits.   PMH/PSH:    [x]  Patient is able to communicate with therapist and verbalize understanding of directions/instructions   []  Patient is unable to communicate with therapist and/or verbalize understanding of directions/understanding due to the following barriers to learning:   []   Reading  [] Language  [] Visual  [] Hearing  []  Other:  Is an interpreter required? [] Yes [x]  No    PMH/PSH:   Past Medical History   Diagnosis Date   ??? Hypertension    ??? Diabetes mellitus    ??? COPD (chronic obstructive pulmonary disease)    ??? GERD (gastroesophageal reflux disease)    ??? Hepatitis    ??? Dysphagia    ,   Past Surgical History   Procedure Laterality Date   ??? Foot surgery Left    ??? Esophagogastroduodenoscopy N/A 11/19/2013     Procedure:  ESOPHAGOGASTRODUODENOSCOPY WITH MAC;  Surgeon: Charlane Ferretti, MD;  Location: [facility] ENDOSCOPY;  Service: Gastroenterology;  Laterality: N/A;   ??? Hernia repair       about 5 yrs ago     Medications: He has a current medication list which includes the following prescription(s): albuterol, amlodipine, aspirin, atorvastatin, blood sugar diagnostic, blood-glucose meter, bupropion hcl, entecavir, fluoxetine, hydrochlorothiazide, ibuprofen, ketoconazole, loratadine, losartan, metformin, ondansetron, orphenadrine, oxycodone, oxycodone-acetaminophen, tamsulosin, and trazodone.  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, water quality, mod to heavy lifting; awkward lifting of equip  Living Environment:     Household Barriers: stairs , has Engineer, civil (consulting) at home: Using std cane now  Prior Treatment: in-pt only  Prior Level of Function: working [employer] @ job; no limitations in all activities, walking  Precautions/Contraindications:     Objective:seated     Posture: holds neck in R SB and note R trunk SB decr spinal extension  Mental Status: alert, appears stated age and cooperative    Vision: states no change in visual acuity, but needs reading glasses    Sensation:  NT  States mild numbness on R  UE testing - see OT notes  LE   Strength: within functional limits LLE except glut max 4/5:   RLE psoas 4-/5 , glut med 5/5, glut max 4/5,     HS & Quads 5/5  Ankle 4+/5 ant tib, peroneals, post tib;  Standing gastroc test : has cramping in leg when attempts heel raise (3/5); was able to walk on tip toes for 10 with light UE support  LEG PRESS: L 50#, R 30#     BLE Range of Motion: normal     Lumbar Range of Motion: limited extension ROM    Transfers:indep sit to stand w/o UE support  Modified Ashworth:0, No increase in tone  Location:    Bed Mobility: indep  Flexibility: wfl  Balance:  Berg Balance test 56/56, FGA  30/30;  Unable to walk on toes, Sl decr speed with crossover stepping to L/R and mild instab with sharpened Rhomberg EC  Coordination:  heel to shin   Gait: TUG wnl ; Normal pattern w/o AD. Observed on level surfaces only - at least 150'  Stairs: observed on 5 steps up/down with normal reciprocal pattern w/o UE support;  Steady balance, but notes R quad feeling is abnormal    Assessment:   Assessment/Problem List: Patient presents with  RLE weakness & chronic LBP/neck pain contributing to functional limitations including inability to perform job duties & limited ambul . Patient would benefit from skilled PT to address the aforementioned deficits.   Rehabilitation Potential: Based on this therapist's assessment, [name] has the following rehabilitation potential for the PT goals stated below: : excellent  Plan Of Care:      Patient/Family Goals: By discharge, pt would like to:   Short Term Goals:   Initial status 08/11/15    1.  Within 3-5 visits Patient will perform  initial home exercise program with minimal verbal cues, to address functional deficits & continue with progress after discharge Needs structured home exercise program to address deficits       longTerm Goals: time frame 3wks Status  08/11/15    1. Improve RLE strengths to 5/5 and demonstrates good core support during lifting & job simulation tasks.  Able to lift 25# ankle to chest safely, with good body mechanics,  Leg press w RLE to 45#   Core & RLE weakness (3/5 to 4+/5) and cramping which limits  ability to do job duties & results in poor posture & contributes to chronic spinal pain.  Leg Press: L 50#, R 30#    2. indep ambul on all surfaces w/o AD Currently ambul with std cane.    Plan:    Treatment to Include: Treatment to Include:  Pt to be seen for program of:   Neuromuscular re-education 97112 & therapeutic ex 97110 for strengthening, flexibility/ROM, facilitation of balance/safety, postural alignment/awareness & improved ease of movement,  correct use of body mechanics, manual therapy 97140 for joint / soft tissue mobilization as needed,  gait training 16109  to increase safety and endurance and therapeutic activities 97530 for transition of skills to home activities/ADL's / improved community access, Home ex program.     Patient/Family Agree to Treatment: Yes      Treatment Diagnosis (ICD-9 Code):       Therapy Frequency/Duration: Patient to receive skilled PT services up to 2 times per week for up to 2-3 weeks or up to 6 visits starting from the first scheduled follow up visit within this plan of care.      Certification Period: 08/11/2015 -  d/c      Therapist Signature: Lyndzee Kliebert P Kenyatta Keidel  Date: 08/11/2015    Physician Certification   I certify that the above patient is under my care an requires the above services. These professional services are to be provided from an established plan, related to the diagnosis and reviewed by me every 90 days.     Additional comments/revisions:     Physician Name: Ellwood Sayers, MD    Signature_______________________________________ Date: _______

## 2015-08-11 NOTE — Unmapped (Signed)
Diagnosis:   1. Ischemic stroke             Referring Provider:Goranovich, Jill Alexanders, MD    Insurance plan:   Payor/Plan Subscr DOB Sex Relation Sub. Ins. ID Effective Group Num   1. [employer] Danny, Myers 08-31-1955 Male  [id number] 07/25/15                                       2. [name] COMMU* [name], [name] 03-Sep-1955 Male  [id number] [birth year]                                    [address]                         # of visits per insurance authorization:[place] 30V  # of visits per POC: 4-6   Date of Initial Eval: 08/11/15     Precautions:     Activity Date:  08/11/15 Date:  Date: Date:    Visit #  1  Initial  eval               CPT code  Daily treatment record Treatment Time   PT Evaluation: 97001 Initial Evaluation 60'   Th  Ex Leg press (start w 50# L, 30# R)    Therapeutic Activities: 97530 Pt education in PT eval findings, POC and information in anatomy & diagnosis provided.  FUTURE visits: body mechanics education & lifting for RTW, ? Ladder skills, carrying loads 10'   Gait training  assess on TM next visit for endurance   assess safety on outdoor surfaces: inclines, paved & grassy Future visit   Neuromuscular Re-education: [id number] Activities / exercise completed to strengthen RLE &core, improve trunk stability/balance and reduce cramping sensation to return patient to PLOF    Trial next: then issue HEP accordingly  Quadruped opp arm/leg lifts, tall kneel & 1/2 kneel balance tasks  BAPS bd work for RLE  Walking on tip toes  Walk ag waist belt resistance for core & posture   Step up progressions: F/S and on/off foam, progress to step across BOSU    ALSO include ex to improve spinal exten posture/ROM/ core support to reduce chronic LBP/Neck pain         Pt response to tx today, progress:       Total Treatment TIme:  26'       Subj :   Pain:   /10 Location:    Additional Information:     Patient Education: (date)    Initial Summary:

## 2015-08-15 NOTE — Unmapped (Signed)
Pt is calling to inform that he needs something that ties his stroke into his hand injury. Pt states his hand doctor gave him a paper that released him to go back to work. Pt had a stroke and is having therapy and states he is supposed to go back to work and his unable to. Pt states if he does not go back to work, he will lose his benefits. Pls advise. Pt states he needs something in writing that states he is not ready to go back to work yet.

## 2015-08-15 NOTE — Unmapped (Signed)
Wrote letter for him and routed to you.

## 2015-08-15 NOTE — Unmapped (Signed)
Spoke with patient. States he has missed a few calls was unsure if it was this office.

## 2015-08-15 NOTE — Unmapped (Signed)
Spoke with patient. Patient would like to pick completed letter up. Please call when available. Patient asked I read the draft to him, which I did. Per patient employer is stating that they have nothing to do with the stroke and focusing on the hand injury that he obtained at work . Stated that he will be unpaid. Sounds like employer might be giving patient a difficult time. Patient is asking if letter could be a bit more detailed. States when he obtained the hand injury his BP was elevated, a MD advise he take his BP med and go home as rest the next day he had a stroke. Would like to know if MD can relate the hand injury, elevated BP to potential causing stroke. Also asked if MD could include that it also appeared he had some mini strokes after the original stroke. I did explain that MD may not be able to address the hand injury because he is a neurologist and treated the stroke.

## 2015-08-15 NOTE — Unmapped (Addendum)
He had a subcortical stroke at beginning of July.   He is not ready to go back to work until I see him.   He also needs OT referral to see how is hand and arm are working.  Strength was OK but he is still likely clumsy which could affect job.    I should see him in one month - add on if needed after he gets OT assessment and therapy related to job.

## 2015-08-15 NOTE — Unmapped (Signed)
Routing to Dr.Broderick  I believe you saw this patient in the hospital

## 2015-08-15 NOTE — Unmapped (Signed)
Dr.Broderick  The patient needs a letter stating that he cannot return to work yet so what should I write in it?

## 2015-08-15 NOTE — Unmapped (Signed)
Occupational Therapy Daily Treatment Note      Name: [name]     DOB: 11/02/1955  MRN: [id number]     Referring Physician:  No referring provider defined for this encounter.  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    Objective:   Pain:  Pre-treatment:  5-6/10  Location: RUE and RLE      Skilled interventions: For each CPT code delivered, document skilled interventions including multiple units per code and support for modifier -59:     The Symbol Digit Modalities Test (SDMT) is a substitution test measure for screening cerebral dysfunction that involves communication between both hemispheres of the brain, converting geometric designs (right hemisphere) into written and/or oral number responses (left hemisphere).  The test requires elements of attention, visuoperceptual processing, working memory, and psychomotor speed.  The age and education related scoring norms are standardized.  A score of 1.0 standard deviations below the mean is considered a low score.  A score of 1.5 standard deviations below the mean is considered a moderately low score.  A score of 2.0 standard deviations below the mean is considered a very low score.  As a general rule, score that fall at or below -1.5 standard deviations from the mean for a given age and education level are indicative of  possible cerebral dysfunction.  [name] completed 26/28 on the SDMT which is between -2.0 to -2.5 standard deviations below the norm. This is considered a very low score for his age and education level.      The Montreal Cognitive Assessment (MoCA) was designed as a rapid screening instrument for mild cognitive dysfunction.  It assesses different cognitive domains: attention, concentration, executive function, memory, language, visuoconstructional skills, conceptual thinking, calculations, and orientation. Time to administer the MoCA is approximately 10 minutes.The total possible score is 30 points with a score of 26 points or above considered normal. [name]'s score of 25/30 suggests very mild impairment.  A score of 1 was added to pt's score due to education level of 12th grade.  Pt missed both responses on abstract processing and missed answers for visuospatial and language sections.    Although, according to normative data, the average MoCA score for the Alzheimer's Disease (AD) group (16.2) is much lower than for the Mild Cognitive Impairment (MCI) group (22.1) there is overlap between them.  Thus, the suggested cut-off score of <26 is the same for both.  The distinction between AD and MCI is mostly dependent on the presence of associated functional impairment and not on a specific score on the MoCA test.    (Information from [facility] administration/scoring instruction booklet and official website [email or web address])        therapeutic activity Assessed pt's cognition  with the MoCA and SDMT.    Provided pt education of cognitive test outcomes.                   Pain:   Post - treatment: 5-6/10  Location: RUE and RLE    Education provided (to patient staff, caregivers. Specify person, place, type of training provided): perceptual/cognitive retraining, therapeutic activity and patient/family education      Assessment: [name] exhibits overall mild cognitive impairment per his MoCA scores, but  possibly more significant impairment with visual processing speed per his SDMT scores which was in the very low range for his age and education level.      Plan/Focus for Continued Treatment:   Frequency/Duration of Treatment: Patient will participate in OT 2x/week x 4 weeks.  Pt requires skilled OT to address the following:  ADL's  Cognitive/ Skills  Therapeutic exercise  Therapeutic Activities  Community/ Work re-intergration  Neuromuscular re-education     Therapy Goals:  ????  Short Term Goals to be met within 2 visits:??   1. Patient will demonstrate independence with progressive HEP for RUE strengthening to address functional deficits with continued progress after discharge.?? ??     ????????????????????????  Long Term Goals to be met within 4 weeks: ??   1.Patient will demonstrate INDEP in home management (meals, laundry, shopping) with good safety, compensatory strategies for memory.??   2.Increase strength (MMT 4/5 to 5/5, grip: increase by 10#)??   3.Assess cognitive skills next session via MOCA, SBT, SDMT,TRAILS. Patient will INDEP verbalize understanding of cognitive compensatory strategies to assist with memory deficits for functional activities.  ??   4.Assess visual perception skills via MVPT, dynavision and keystone to make appropriate recommendations for vision and community reentry.??   5.Patient will be knowledgeable of community based/wellness program.????????????????????????????????????????????                  Services Provided/Billing:   CPT Qty Min Procedure   CPT Qty Procedure   Evaluation/ReEvaluation   Application of Modalities Untimed Document type/mins   [id number]   OT Evaluation - low complexity  [id number]  Unattended E Stim / IFC / HVPC / MFAC   97166   OT Evaluation - moderate complexity  97010  Cold Pack (document pre/post tx)   97167     OT Evaluation- high complexity   97010   Hot Pack (document pre/post tx)   97168     OT Re-Evaluation   97018   Paraffin (document pre/post tx)   Therapeutic Procedures Each 15 Minutes   97022    Whirlpool (document mins)   97110     Therapeutic Exercises   97150   Group Therapy (document mins)   97112     Neuromuscular Re-ed         97760     Orthotic Training/Fitting         97530  2  24 Therapeutic Activity   Other   97532     Development of Cog Skills           97035     Ultrasound           97032   E-stim; attended       97535     Self Care           97140   Manual Therapy       [phone]     Wheelchair Management  Treatment Diagnosis:   1. Right arm weakness         Timed Code Treatment Minutes: 24 minutes  Total Treatment Time: 26 minutes  Total Number of Billed CPT Codes: 2       Therapist Signature: Vale Peraza C Kimanh Templeman, OTR/L   Date of Treatment: 08/16/2015

## 2015-08-16 ENCOUNTER — Encounter: Admit: 2015-08-16 | Discharge: 2015-08-16 | Payer: Worker's Compensation

## 2015-08-16 DIAGNOSIS — I639 Cerebral infarction, unspecified: Secondary | ICD-10-CM

## 2015-08-16 DIAGNOSIS — R29898 Other symptoms and signs involving the musculoskeletal system: Secondary | ICD-10-CM

## 2015-08-16 NOTE — Unmapped (Signed)
Diagnosis:   1. Ischemic stroke             Referring Provider:No ref. provider found    Insurance plan:   Payor/Plan Subscr DOB Sex Relation Sub. Ins. ID Effective Group Num   1. WORKERS COMP Danny, Myers Apr 22, 1955 Male  [id number] 07/25/15                                       2. [name] COMMU* Myers, [name] July 17, 1955 Male  [id number] 09/28/01                                    [address]                         # of visits per insurance authorization:[place] 30V  # of visits per POC: 4-6   Date of Initial Eval: 08/11/15     Precautions:     Activity Date:  08/11/15 Date:  Date: Date:    Visit #  1  Initial  eval               CPT code  Daily treatment record Treatment Time        Th  Ex Leg press ( 50# L, 40# R) 10+10x each 8'   Therapeutic Activities: 97530 Pt education in PT eval findings, POC and information in anatomy & diagnosis provided.    FUTURE visits: body mechanics education & lifting for RTW, ? Ladder skills,  carrying loads, may include on stairs or elevated platforms    Gait training  assess on TM next visit for endurance   assess safety on outdoor surfaces: inclines, paved & grassy Future visit   Neuromuscular Re-education: [phone] Activities / exercise completed to strengthen RLE &core, improve trunk stability/balance and reduce cramping sensation to return patient to PLOF    Quadruped opp arm/leg lifts, tall kneel & 1/2 kneel balance tasks w arm lifts/reaches:  No decr balance noted w all transfers, floor to stand indep  UE WB w/o pain  Lifting 20# bucket, floor to waist, w ea UE w/o difficulty & carried 4'  Simulated job duties, smoothing new concrete on flooring - able to use BUE's well.    Check next:  BAPS bd work for RLE  Walking on tip toes  Walk ag waist belt resistance for core & posture   Step up progressions: F/S and on/off foam, progress to step across BOSU    Demonstrated for pt return to community gym ex:  Row machine & chest press w focus on maint upright spinal posture & core  stab  Leg press (see above)    ALSO include ex to improve spinal exten posture/ROM/ core support to reduce chronic LBP/Neck pain    Next : further assessment of cervical function/ ULTT     AROM C spine:   Min decr ROM of: exten; (no pain) & L SB,   MIld L post forearm arm pain w L Rot; with R & L SB, has L UT discomfort   27'    Pt response to tx today: performed well with all mobility tasks.       Total Treatment TIme:  33'       Subj : RUE feels sl numb today.  States wants to  be able to RTW, but MD has not released him yet (due to CVA).  Pt discusses hx of pinched nerve in neck with intermittent RUE N&T & prior TIA's (seen on recent MRI).  Pain:   /10 Location:    Additional Information:   Pt discusses hx of pinched nerve in neck with intermittent RUE N&T & prior TIA's (seen on recent MRI).  Patient Education: (date)    Initial Summary:  Patient presents with?? RLE weakness & chronic LBP/neck pain contributing to functional limitations including inability to perform job duties & limited ambul .  Patient/Family Goals: By discharge, pt would like to:   Short Term Goals:?? ?? Initial status 08/11/15?? ??   1.?? Within 3-5 visits Patient will perform?? initial home exercise program with minimal verbal cues, to address functional deficits & continue with progress after discharge?? Needs structured home exercise program to address deficits ?? ??   ????  longTerm Goals: time frame 3wks?? Status?? 08/11/15?? ??   1. Improve RLE strengths to 5/5 and demonstrates good core support during lifting & job simulation tasks.  Able to lift 25# ankle to chest safely, with good body mechanics,  Leg press w RLE to 45#  ?? Core & RLE weakness (3/5 to 4+/5) and cramping which limits?? ability to do job duties & results in poor posture & contributes to chronic spinal pain.  Leg Press: L 50#, R 30#?? ??   2. indep ambul on all surfaces w/o AD?? Currently ambul with std cane.?? ??

## 2015-08-16 NOTE — Unmapped (Signed)
I can't relate the hand injury to the stroke if it occurred before the stroke.

## 2015-08-17 NOTE — Unmapped (Signed)
Talked to pt and relayed message   Call complete

## 2015-08-18 ENCOUNTER — Encounter: Admit: 2015-08-18 | Discharge: 2015-08-18 | Payer: Worker's Compensation

## 2015-08-18 DIAGNOSIS — I639 Cerebral infarction, unspecified: Secondary | ICD-10-CM

## 2015-08-18 NOTE — Unmapped (Signed)
Diagnosis:   1. Ischemic stroke             Referring Provider:Goranovich, Jill Alexanders, MD    Insurance plan:   Payor/Plan Subscr DOB Sex Relation Sub. Ins. ID Effective Group Num   1. WORKERS COMP SELDEN, NOTEBOOM [birth year] Male  [id number] 07/25/15                                       2. [name] COMMU* ZEPPELIN, COMMISSO 1956/01/27 Male  [id number] 09/28/01                                    [address]                      # of visits per insurance authorization:[place] 30V  # of visits per POC: 4-6   Date of Initial Eval: 08/11/15     Precautions:     Activity Date:  08/11/15 Date: 08/16/15 Date:08/18/15 Date:    Visit #  1  Initial  eval 2 3             CPT code  Daily treatment record Treatment Time        Th  Ex Leg press ( 50# L, 40# R) 10+10x each    Therapeutic Activities: 97530  body mechanics education & lifting with 24# ankle to chest & 18# chest to head level; max VC for correct technique for RTW,   carrying loads of 25# in tool bucket,   On stairs : carried 25# tool bucket w R hand indep w 1 rail;  W/o railing, carried 25# load w B hands (no rail) indep, but for safety on descent: had to use single step pattern   20'     Gait training TM ambul endurance @ 1.7 mph 4 minutes - stopped due to incr radicular pain to RLE   (ant thigh to lat calf)    ( Able to relieve & centralize with POE positional stretch (tol only 3 partial pressups)      Needed freq VC/TC to maint upright spinal posture   assess safety on outdoor surfaces: inclines, paved & grassy 4'        Next visit   Neuromuscular Re-education: [phone] Activities / exercise completed to strengthen RLE &core, improve trunk stability/balance and reduce cramping sensation to return patient to PLOF  nclude ex to improve spinal exten posture/ROM/ core support to reduce chronic LBP/Neck pain   Able to relieve & centralize with POE positional stretch (tol only 3 partial pressups)  Sustained bridging w marching  Supine core stab w slow bicycle  Quadruped opp arm/leg  lifts,    FUTURE visits  BAPS bd work for RLE  Walking on tip toes  Walk ag waist belt resistance for core & posture   Step up progressions: F/S and on/off foam, progress to step across BOSU    Next : further assessment of cervical function/ ULTT     AROM C spine:  Min decr ROM of: exten; (no pain) & L SB,   MIld L post forearm arm pain w L Rot; with R & L SB, has L UT discomfort     24'  Pt response to tx today: performed well with lifting tasks after body mech instructions; min LBP;  Had incr RLE radic pain after 4 min on TM. Able to relieve & centralize with POE positional stretch (tol only 3 partial pressups)     Total Treatment TIme:  45'       Subj : RUE feels sl numb today.  States wants to be able to RTW, but MD has not released him yet (due to CVA).  Pt discusses hx of pinched nerve in neck with intermittent RUE N&T & prior TIA's (seen on recent MRI).  Pain:   /10 Location:    Additional Information:   Pt discusses hx of pinched nerve in neck with intermittent RUE N&T & prior TIA's (seen on recent MRI).  Patient Education: (date)  Demonstrated for pt return to community gym ex:  Row machine & chest press w focus on maint upright spinal posture & core stab  Leg press (see above)  HEP -Quadruped opp arm/leg lifts, tall kneel & 1/2 kneel balance tasks w arm lifts/reaches:  POE & partial press up stretches  Sustained bridging w marching  Supine core stab w slow bicycle    Initial Summary:  Patient presents with?? RLE weakness & chronic LBP/neck pain contributing to functional limitations including inability to perform job duties & limited ambul .  Patient/Family Goals: By discharge, pt would like to:   Short Term Goals:?? ?? Initial status 08/11/15?? ??   1.?? Within 3-5 visits Patient will perform?? initial home exercise program with minimal verbal cues, to address functional deficits & continue with progress after discharge?? Needs structured home exercise program to address  deficits ?? ??   ????  longTerm Goals: time frame 3wks?? Status?? 08/11/15?? ??   1. Improve RLE strengths to 5/5 and demonstrates good core support during lifting & job simulation tasks.  Able to lift 25# ankle to chest safely, with good body mechanics,  Leg press w RLE to 45#  ?? Core & RLE weakness (3/5 to 4+/5) and cramping which limits?? ability to do job duties & results in poor posture & contributes to chronic spinal pain.  Leg Press: L 50#, R 30#?? ??   2. indep ambul on all surfaces w/o AD?? Currently ambul with std cane.?? ??

## 2015-08-22 ENCOUNTER — Encounter

## 2015-08-22 ENCOUNTER — Inpatient Hospital Stay: Admit: 2015-08-22 | Attending: Family Medicine | Primary: Family Medicine

## 2015-08-22 DIAGNOSIS — R221 Localized swelling, mass and lump, neck: Secondary | ICD-10-CM

## 2015-08-22 MED ORDER — IOPAMIDOL 76 % IV SOLN
76 % | Freq: Once | INTRAVENOUS | Status: AC | PRN
Start: 2015-08-22 — End: 2015-08-22
  Administered 2015-08-22: 19:00:00 75 mL via INTRAVENOUS

## 2015-08-22 MED FILL — ISOVUE-370 76 % IV SOLN: 76 % | INTRAVENOUS | Qty: 100

## 2015-08-23 ENCOUNTER — Encounter: Admit: 2015-08-23 | Discharge: 2015-08-23 | Payer: Worker's Compensation

## 2015-08-23 DIAGNOSIS — R29898 Other symptoms and signs involving the musculoskeletal system: Secondary | ICD-10-CM

## 2015-08-23 MED ORDER — entecavir (BARACLUDE) 0.5 MG tablet
0.5 | ORAL_TABLET | Freq: Every day | ORAL | Status: AC
Start: 2015-08-23 — End: 2016-04-15

## 2015-08-23 NOTE — Unmapped (Signed)
Occupational Therapy Daily Treatment Note      Name: [name]     DOB: 1955/08/07  MRN: [id number]     Referring Physician:  Ellwood Sayers, MD  [address]  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    Objective:   Pain:  Pre-treatment:  5-6/10  Location: RUE and RLE      Skilled interventions: For each CPT code delivered, document skilled interventions including multiple units per code and support for modifier -59:     The Symbol Digit Modalities Test (SDMT) is a substitution test measure for screening cerebral dysfunction that involves communication between both hemispheres of the brain, converting geometric designs (right hemisphere) into written and/or oral number responses (left hemisphere).  The test requires elements of attention, visuoperceptual processing, working memory, and psychomotor speed.  The age and education related scoring norms are standardized.  A score of 1.0 standard deviations below the mean is considered a low score.  A score of 1.5 standard deviations below the mean is considered a moderately low score.  A score of 2.0 standard deviations below the mean is considered a very low score.  As a general rule, score that fall at  or below -1.5 standard deviations from the mean for a given age and education level are indicative of possible cerebral dysfunction.  Morgen Earline Mayotte completed 26/28 on the SDMT which is between -2.0 to -2.5 standard deviations below the norm. This is considered a very low score for his age and education level.      The Montreal Cognitive Assessment (MoCA) was designed as a rapid screening instrument for mild cognitive dysfunction.  It assesses different cognitive domains: attention, concentration, executive function, memory, language, visuoconstructional skills, conceptual thinking, calculations, and orientation. Time to administer the MoCA is approximately 10 minutes.The total possible score is 30 points with a score of 26 points or above considered normal. Tanmay O Santino's score of 25/30 suggests very mild impairment.  A score of 1 was added to pt's score due to education level of 12th grade.  Pt missed both responses on abstract processing and missed answers for visuospatial and language sections.        therapeutic activity Dynavision: Simple: 1.15, 1.07                      Complex: 1.49,1.44 with 10/10 numbers seen ; 1:07 with 9/10    The Trail Making Test (TMT) is a neuropsychological test of visual attention and task switching.  It consists of two parts in which the subject is instructed to connect a set of 25 dots as fast as possible while still  maintaining accuracy. It can provide information about visual search speed, scanning, speed of processing, mental flexibility, as well as executive functioning.  Results for both TMT A and B are reported as the number of seconds required to complete the task; therefore, higher scores reveal greater impairment.  Trail A (29 seconds = average; >78 seconds = deficient).  Trail B (75 seconds = average; >273 seconds (4.55 minutes) = deficient).  It is unnecessary to continue the test if the patient has not completed both parts after 5 minutes have elapsed.  Wai Earline Mayotte completed Trail Making A in 30 seconds which is in the normal range.  his score on Trail Making B: UNABLE IN FIRST TRIAL, MULTIPLE MISTAKES. Requested a second attempt:  Mistakes started at #4, multiple errors.                   Pain:   Post - treatment: 5-6/10  Location: RUE and RLE    Education provided (to patient staff, caregivers. Specify person, place, type of training provided): perceptual/cognitive retraining, therapeutic activity and patient/family education      Assessment: KEIL PICKERING exhibits overall mild cognitive impairment , impairment with visual processing speed per his SDMT scores which was in the very low range for his age and education level. Delayed visual processing. Discussed concerns for driving.      Plan/Focus for Continued Treatment:   Frequency/Duration of Treatment: Patient will participate in OT 2x/week x 4 weeks.  Pt requires skilled OT to address the following:  ADL's  Cognitive/ Skills  Therapeutic exercise  Therapeutic Activities  Community/ Work re-intergration  Neuromuscular re-education     Therapy Goals:  ????  Short Term Goals to be met within 2 visits:??   1. Patient will demonstrate independence with progressive HEP for RUE strengthening to address functional deficits with continued progress after discharge.?? ??     ????????????????????????  Long Term Goals to be met within 4 weeks: ??   1.Patient will demonstrate INDEP in home management (meals, laundry, shopping) with good safety, compensatory strategies for memory.??   2.Increase strength (MMT 4/5 to 5/5, grip: increase by 10#)??   3.Assess cognitive skills next session via MOCA, SBT, SDMT,TRAILS. Patient will INDEP verbalize understanding of cognitive compensatory strategies to assist with memory deficits for functional activities.  ??   4.Assess visual perception skills via MVPT, dynavision and keystone to make appropriate recommendations for vision and community reentry.??   5.Patient will be knowledgeable of community  based/wellness program.????????????????????????????????????????????                  Services Provided/Billing:   CPT Qty Min Procedure   CPT Qty Procedure   Evaluation/ReEvaluation   Application of Modalities Untimed Document type/mins   [id number]   OT Evaluation - low complexity  [id number]  Unattended E Stim / IFC / HVPC / MFAC   97166   OT Evaluation - moderate complexity  97010  Cold Pack (document pre/post tx)   97167     OT Evaluation- high complexity   97010   Hot Pack (document pre/post tx)   97168     OT Re-Evaluation   97018   Paraffin (document pre/post tx)   Therapeutic Procedures Each 15 Minutes   97022   Whirlpool (document mins)   97110     Therapeutic Exercises   97150   Group Therapy (document mins)   97112     Neuromuscular Re-ed  16109     Orthotic Training/Fitting         97530 3 43 Therapeutic Activity   Other   [id number]     Development of Cog Skills           [id number]     Ultrasound           [id number]   E-stim; attended       [phone]     Self Care           97140   Manual Therapy       [phone]     Wheelchair Management                 Treatment Diagnosis:   1. Right arm weakness     2. Impaired mobility and activities of daily living         Timed Code Treatment Minutes: 43 minutes  Total Treatment Time: 43 minutes  Total Number of Billed CPT Codes: 3       Therapist Signature: Tressia Miners, OTR/L   Date of Treatment: 08/23/2015

## 2015-08-25 ENCOUNTER — Encounter: Admit: 2015-08-25 | Discharge: 2015-08-25 | Payer: Worker's Compensation

## 2015-08-25 DIAGNOSIS — I639 Cerebral infarction, unspecified: Secondary | ICD-10-CM

## 2015-08-25 NOTE — Unmapped (Signed)
Diagnosis:   1. Ischemic stroke             Referring Provider:Goranovich, Jill Alexanders, MD    Insurance plan:   Payor/Plan Subscr DOB Sex Relation Sub. Ins. ID Effective Group Num   1. WORKERS COMP KYMANI, SHIMABUKURO 11/11/1955 Male  [id number] 07/25/15                                       2. [name] COMMU* JESSEN, SIEGMAN Jun 13, 1955 Male  [id number] 09/28/01                                    [address]                      # of visits per insurance authorization:[place] 30V  # of visits per POC: 4-6   Date of Initial Eval: 08/11/15     Precautions:     Activity Date:  08/11/15 Date: 08/16/15 Date:08/18/15 Date: 08/25/15    Visit #  1  Initial  eval 2 3 4             CPT code  Daily treatment record Treatment Time        Therapeutic Exercise: 97110  -provided patient with stretches to address low back pain 30H x 2    Incline gastroc stretch    @ stairs hamstring (appears tighter on R)   -leg press 2 x 10     BLE 60#    RLE 40#; LLE 50# 16 min (1)   Therapeutic Activities: 97530 Lifting: x 5 ea    Ankles to waist  25#    Waist to head level 18#  Carrying 20#    100' x 2     Up 5 steps     Down 5 step  24 min (2)    Gait training Unable to assess gait outside, d/t weather 08/25/2015   TM 2.25mph x 5 min, moderate VI and tactile cues for posture, increasing step length (patient takes short steps, decreased TKE on heel strike.  Nc   Neuromuscular Re-education: 97112        Pt response to tx today: patient reported a work out feeling in LEs     Total Treatment TIme:  45 min      Subj:  Patient reported BLE/UE feel weaker, giving him trouble. Reported lower back pain 7-8/10. Worried about return to work, short term disability   Pain: 7-8  /10 Location:lower back     Additional Information: 08/24/15: patient required minimal cues for body mechanics   Pt discusses hx of pinched nerve in neck with intermittent RUE N&T & prior TIA's (seen on recent MRI).    Patient Education:   Demonstrated for pt return to community gym ex:  Row  machine & chest press w focus on maint upright spinal posture & core stab  Leg press (see above)  HEP -Quadruped opp arm/leg lifts, tall kneel & 1/2 kneel balance tasks w arm lifts/reaches:  POE & partial press up stretches  Sustained bridging w marching  Supine core stab w slow bicycle    Initial Summary:  Patient presents with?? RLE weakness & chronic LBP/neck pain contributing to functional limitations including inability to perform job duties & limited ambul .  Patient/Family Goals: By discharge, pt  would like to:   Short Term Goals:?? ?? Initial status 08/11/15?? ??   1.?? Within 3-5 visits Patient will perform?? initial home exercise program with minimal verbal cues, to address functional deficits & continue with progress after discharge?? Needs structured home exercise program to address deficits ?? ??   ????  longTerm Goals: time frame 3wks?? Status?? 08/11/15?? ??   1. Improve RLE strengths to 5/5 and demonstrates good core support during lifting & job simulation tasks.  Able to lift 25# ankle to chest safely, with good body mechanics,  Leg press w RLE to 45#  ?? Core & RLE weakness (3/5 to 4+/5) and cramping which limits?? ability to do job duties & results in poor posture & contributes to chronic spinal pain.  Leg Press: L 50#, R 30#?? ??   2. indep ambul on all surfaces w/o AD?? Currently ambul with std cane.?? ??

## 2015-08-25 NOTE — Unmapped (Signed)
Received a call from [facility]. They said they needed a refill for Entecavir. I told them it was electronically sent on 7/26. He said they do not have it in their system. I was transferred to the pharmacists and refilled script.

## 2015-08-28 ENCOUNTER — Encounter: Admit: 2015-08-28 | Discharge: 2015-08-28 | Payer: Worker's Compensation

## 2015-08-28 DIAGNOSIS — Z7409 Other reduced mobility: Secondary | ICD-10-CM

## 2015-08-28 NOTE — Unmapped (Signed)
Occupational Therapy Daily Treatment Note      Name: [name]     DOB: 1955/08/16  MRN: [id number]     Referring Physician:  Ellwood Sayers, MD  [address]  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 Patient concerned that his pain prescription is missing, not sure if girlfriend took prescription. He is worried that when he goes back to clinic that his urine will be clear and the doctor will not prescribe any more percocet.     Objective:   Pain:  Pre-treatment:  5-6/10  Location: RUE and RLE      Skilled interventions: For each CPT code delivered, document skilled interventions including multiple units per code and support for modifier -59:     The Symbol Digit Modalities Test (SDMT) is a substitution test measure for screening cerebral dysfunction that involves communication between both hemispheres of the brain, converting geometric designs (right hemisphere) into written and/or oral number responses (left hemisphere).  The test requires elements of attention, visuoperceptual processing, working memory, and psychomotor speed.  The age and education related scoring norms are standardized.  A score of 1.0 standard deviations below the mean is considered a low score.  A score of 1.5 standard deviations below the  mean is considered a moderately low score.  A score of 2.0 standard deviations below the mean is considered a very low score.  As a general rule, score that fall at or below -1.5 standard deviations from the mean for a given age and education level are indicative of possible cerebral dysfunction.  Octavis Earline Mayotte completed 26/28 on the SDMT which is between -2.0 to -2.5 standard deviations below the norm. This is considered a very low score for his age and education level.      The Montreal Cognitive Assessment (MoCA) was designed as a rapid screening instrument for mild cognitive dysfunction.  It assesses different cognitive domains: attention, concentration, executive function, memory, language, visuoconstructional skills, conceptual thinking, calculations, and orientation. Time to administer the MoCA is approximately 10 minutes.The total possible score is 30 points with a score of 26 points or above considered normal. Vrishank O Rath's score of 25/30 suggests very mild impairment.  A score of 1 was added to pt's score due to education level of 12th grade.  Pt missed both responses on abstract processing and missed answers for visuospatial and language sections.    Trail A (29 seconds = average; >78 seconds = deficient).  Trail B (75 seconds = average; >273 seconds (4.55 minutes) = deficient).  It is unnecessary to continue the test if the patient has not completed both parts after 5 minutes have elapsed.  Kayo Earline Mayotte completed Trail Making A in 30  seconds which is in the normal range.  his score on Trail Making B: UNABLE IN FIRST TRIAL, MULTIPLE MISTAKES. Requested a second attempt:  Mistakes started at #4, multiple errors    therapeutic activity Patient verbalizing frustration regarding issues he is having with his memory, misplacing things, not thinking clearly. He is preoccupied with the thought that he  Has misplaced his prescription for his pain meds and he is fearful the MD will stop prescribing to him.  He is also concerned that his girlfriend may have stolen the script from him as she had been previously addicted to prescription pain meds.     Patient educated on compensatory strategies for keeping track of appointments, locating items in his home. Discussed the importance of repetition and organization with routine and in home setting. Also reviewed stress management principles regarding life stressors, managing appointments and not losing belongings. Patient has had long history of high blood pressure and he is worried that he will have another stroke due to BP issues, stressors with girlfriend, expecting baby in December.         Pain:   Post - treatment: 5-6/10  Location: RUE and RLE    Education provided (to patient staff, caregivers. Specify person, place, type of training provided): perceptual/cognitive retraining, therapeutic activity and patient/family education      Assessment: JERIN FRANZEL exhibits overall mild cognitive impairment , impairment with visual processing speed per his SDMT scores which was in the very low range for his age and education level. Delayed visual processing. Discussed concerns for driving.      Plan/Focus for Continued Treatment:   Frequency/Duration of Treatment: Patient will participate in OT 2x/week x 4 weeks.  Pt requires skilled OT to address the following:  ADL's  Cognitive/ Skills  Therapeutic exercise  Therapeutic Activities  Community/ Work re-intergration  Neuromuscular re-education     Therapy Goals:  ????  Short Term Goals to be met within 2 visits:??   1. Patient will demonstrate independence with progressive HEP for RUE strengthening to address functional deficits with continued progress after discharge.?? ??     ????????????????????????  Long Term Goals to be met within 4 weeks: ??   1.Patient will demonstrate INDEP in home management (meals, laundry, shopping) with good safety, compensatory strategies for memory.??   2.Increase strength (MMT 4/5 to 5/5, grip: increase by 10#)??    3.Assess cognitive skills next session via MOCA, SBT, SDMT,TRAILS. Patient will INDEP verbalize understanding of cognitive compensatory strategies to assist with memory deficits for functional activities.  ??   4.Assess visual perception skills via MVPT, dynavision and keystone to make appropriate recommendations for vision and community reentry.??   5.Patient will be knowledgeable of community based/wellness program.????????????????????????????????????????????                  Services Provided/Billing:   CPT Qty Min Procedure   CPT Qty Procedure   Evaluation/ReEvaluation   Application of Modalities Untimed Document type/mins   [id number]   OT Evaluation - low complexity  [id number]  Unattended E Stim / IFC / HVPC / MFAC   97166   OT Evaluation - moderate complexity  97010  Cold Pack (document pre/post tx)   97167     OT Evaluation- high complexity   97010   Hot Pack (document pre/post tx)   45409     OT Re-Evaluation   97018   Paraffin (document pre/post tx)   Therapeutic Procedures Each 15 Minutes   [phone]   Whirlpool (  document mins)   97110     Therapeutic Exercises   97150   Group Therapy (document mins)   97112     Neuromuscular Re-ed         97760     Orthotic Training/Fitting         97530 3 43 Therapeutic Activity   Other   97532     Development of Cog Skills           97035     Ultrasound           97032   E-stim; attended       97535     Self Care           97140   Manual Therapy       [phone]     Wheelchair Management                 Treatment Diagnosis:   1. Impaired mobility and activities of daily living         Timed Code Treatment Minutes: 43 minutes  Total Treatment Time: 43 minutes  Total Number of Billed CPT Codes: 3       Therapist Signature: Tressia Miners, OTR/L   Date of Treatment: 08/28/2015

## 2015-08-30 ENCOUNTER — Encounter: Admit: 2015-08-30 | Discharge: 2015-08-30 | Payer: MEDICAID

## 2015-08-30 DIAGNOSIS — Z7409 Other reduced mobility: Secondary | ICD-10-CM

## 2015-08-30 DIAGNOSIS — I6350 Cerebral infarction due to unspecified occlusion or stenosis of unspecified cerebral artery: Secondary | ICD-10-CM

## 2015-08-30 NOTE — Unmapped (Signed)
Diagnosis:   1. Ischemic stroke           Referring Provider:Goranovich, Jill Alexanders, MD    Insurance plan:   Payor/Plan Subscr DOB Sex Relation Sub. Ins. ID Effective Group Num   1. WORKERS COMP Danny, Myers 09/30/1955 Male  [id number] 07/25/15                                       2. [name] COMMU* Danny, Myers 04-07-1955 Male  [id number] 09/[birth year]01                                    [address]                      # of visits per insurance authorization:[place] 30V  # of visits per POC: 4-6   Date of Initial Eval: 08/11/15     Precautions:     Activity Date:  08/11/15 Date: 08/16/15 Date:08/18/15 Date: 08/25/15 08/30/15      Visit #  1  Initial  eval 2 3 4 5                  CPT code  Daily treatment record Treatment Time   NM re-educ     Therapeutic Exercise: 97110   stretches to address low back pain: & review of HEP  POE stretch H60 & then press ups to full ROM (H10) now x5   gastroc stretch: runner's stretch or edge of step H60 x2  Child pose stretch H 60 x2; VC for knees apart & allow lumbar exten vs incr rounding of back during stretch  Knee rolls x3, H30  Hamstring stretch, seated & @ stairs w FOCUS on maint ant pelvic tilt/ lumbar lordosis  Sustained bridging w marching x20 w/o incr pain  Supine core stab w slow bicycle after S-Dktc lift ; TC for correct TA activation & appropriate amt of 'reach out' w LE to maintain LP stab    REveiw MMT LE's wnl except: B glut max 5-/5: ??  RLE psoas 4-+5 ,  ??????  Ankle ;?? Standing gastroc test 4/5 R,  5-/5 L    Core stab ex: ADDED  Quadruped: alt hip exten x10 - unable to add opp arm lift due to incr LBP    -leg press - deferred to next visit    BLE 60#    RLE 40#; LLE 50# 35'         Therapeutic Activities: 97530 Lifting: x 5 ea    Ankles to waist  25#    Waist to head level 18#  Carrying 20#    100' x 2  And then   Up/down 5 steps      Next:Cont progressions, trial carry loads up/down ladder also   Gait training assess gait outside,10 min 08/30/2015 - ambul on level & inclined  grassy surfaces, multiple directions with no decr speed, instab or difficulty   10'   Neuromuscular Re-education: 97112        Pt response to tx today: pt states decr LBP to 7-8/10 after ex   Needs max VC to avoid wrong ex (fwd bending w bounces, BLE SLR) & to perform correct stretches & core ex as outlined above.  Note incr lumbar exten ROM today     Total  Treatment TIme:  64'     Subj:  Patient reports   Pain:   9/10 Location:lower back     Additional Information: 08/24/15: patient required minimal cues for body mechanics   Pt discusses hx of pinched nerve in neck with intermittent RUE N&T & prior TIA's (seen on recent MRI).    Patient Education:   Demonstrated for pt return to community gym ex:  Row machine & chest press w focus on maint upright spinal posture & core stab  Leg press (see above)  HEP -Quadruped opp arm/leg lifts, tall kneel & 1/2 kneel balance tasks w arm lifts/reaches:  POE & partial press up stretches  Sustained bridging w marching  Supine core stab w slow bicycle    Initial Summary:  Patient presents with?? RLE weakness & chronic LBP/neck pain contributing to functional limitations including inability to perform job duties & limited ambul .  Patient/Family Goals: By discharge, pt would like to:   Short Term Goals:?? ?? Initial status 08/11/15?? ??08/30/15   1.?? Within 3-5 visits Patient will perform?? initial home exercise program with minimal verbal cues, to address functional deficits & continue with progress after discharge?? Needs structured home exercise program to address deficits ?? ??HEP issued  ? Compliance as unable to demonstrate yet w/o max cues   ????  longTerm Goals: time frame 3wks?? Status?? 08/11/15?? ??   1. Improve RLE strengths to 5/5 and demonstrates good core support during lifting & job simulation tasks.  Able to lift 25# ankle to chest safely, with good body mechanics,  Leg press w RLE to 45#  ?? Core & RLE weakness (3/5 to 4+/5) and cramping which limits?? ability to do job duties &  results in poor posture & contributes to chronic spinal pain.  Leg Press: L 50#, R 30#?? ??MMT LE's wnl except: B glut max 5-/5: ??and RLE psoas 4-+5 , ??   gastroc:  4/5 R,  5-/5 L    Lifting:   Ankles to waist  25#    Waist to head level 18#  Carrying 20#    100' x 2    And then  Up/down 5 steps    2. indep ambul on all surfaces w/o AD?? Currently ambul with std cane.?? ??gait outside,10 min 08/30/2015 - ambul on level & inclined grassy surfaces, multiple directions with no decr speed, instab or difficulty     Can walk on TM @ 2.65mph

## 2015-08-30 NOTE — Unmapped (Signed)
Occupational Therapy Daily Treatment Note      Name: [name]     DOB: [birth year]  MRN: [id number]     Referring Physician:  Ellwood Sayers, MD  [address]  [place], [place] [address]  Phone: [phone]  Fax:      Primary Diagnosis: CVA    Precautions: n/a    Total Visits: 4/8  Progress Note Due (every 30 days from first tx session after initial eval): 09/09/15  Insurance: Payor: [employer] / Plan: [facility] COMMUNITY HEALTH / Product Type: Medicaid [place] care /   07.10.17/AU/NNB/[id number]/BENEFIT AUTH INFO  PT,OT,ST- Restaurant manager, 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 States he is having less word finding issues.Conversation appears fluent.    Objective: Pain:  Pre-treatment:  5-6/10  Location: RUE and RLE      Skilled interventions: For each CPT code delivered, document skilled interventions including multiple units per code and support for modifier -59:     The Symbol Digit Modalities Test (SDMT) is a substitution test measure for screening cerebral dysfunction that involves communication between both hemispheres of the brain, converting geometric designs (right hemisphere) into written and/or oral number responses (left hemisphere).  The test requires elements of attention, visuoperceptual processing, working memory, and psychomotor speed.  The age and education related scoring norms are standardized.  A score of 1.0 standard deviations below the mean is considered a low score.  A score of 1.5 standard deviations below the mean is considered a moderately low score.  A score of 2.0 standard deviations below the mean is considered a very low score.  As a general rule, score that fall at or below -1.5 standard deviations  from the mean for a given age and education level are indicative of possible cerebral dysfunction.  [name] completed 26/28 on the SDMT which is between -2.0 to -2.5 standard deviations below the norm. This is considered a very low score for his age and education level.      The Montreal Cognitive Assessment (MoCA) was designed as a rapid screening instrument for mild cognitive dysfunction.  It assesses different cognitive domains: attention, concentration, executive function, memory, language, visuoconstructional skills, conceptual thinking, calculations, and orientation. Time to administer the MoCA is approximately 10 minutes.The total possible score is 30 points with a score of 26 points or above considered normal. [name]'s score of 25/30 suggests very mild impairment.  A score of 1 was added to pt's score due to education level of 12th grade.  Pt missed both responses on abstract processing and missed answers for visuospatial and language sections.    Trail A (29 seconds = average; >78 seconds = deficient).  Trail B (75 seconds = average; >273 seconds (4.55 minutes) = deficient).  It is unnecessary to continue the test if the patient has not completed both parts after 5 minutes have elapsed.  [name] completed Trail Making A in 30 seconds which is in the normal range.  his score on Trail Making B: UNABLE IN FIRST TRIAL, MULTIPLE MISTAKES. Requested a second attempt:  Mistakes started at #4, multiple errors    therapeutic activity Patient verbalizing frustration regarding issues he is having with his  memory, misplacing things, not thinking clearly.States he replies on his girlfriend for his medication management. OT provided education and recommendations for compensatory methods for meds by using timers on his phone, mediset , organizational strategies as well as importance of consistency, routine with schedule. Education for keeping a written calendar, appointments, refill reminders etc,  rather than relying     Discussed the importance of repetition and organization with routine and in home setting. Patient receptive to education. Reviewed necessary skills for return to work, memory, independence in daily routine and function.         Pain:   Post - treatment: 5-6/10  Location: RUE and RLE    Education provided (to patient staff, caregivers. Specify person, place, type of training provided): perceptual/cognitive retraining, therapeutic activity and patient/family education      Assessment: [name] exhibits overall mild cognitive impairment , impairment with visual processing speed per his SDMT scores which was in the very low range for his age and education level. Delayed visual processing. Discussed concerns for driving.      Plan/Focus for Continued Treatment:   Frequency/Duration of Treatment: Patient will participate in OT 2x/week x 4 weeks.  Pt requires skilled OT to address the following:  ADL's  Cognitive/ Skills  Therapeutic exercise  Therapeutic Activities  Community/ Work re-intergration  Neuromuscular re-education     Therapy Goals:  ????  Short Term Goals to be met within 2 visits:??   1. Patient will demonstrate independence with progressive HEP for RUE strengthening to address functional deficits with continued progress after discharge.?? ??     ????????????????????????  Long Term Goals to be met within 4 weeks: ??   1.Patient will demonstrate INDEP in home management (meals, laundry, shopping) with good safety, compensatory strategies for memory.??   2.Increase strength (MMT 4/5 to 5/5, grip: increase by 10#)??   3.Assess cognitive skills next session via MOCA, SBT, SDMT,TRAILS. Patient will INDEP verbalize understanding of cognitive compensatory strategies to assist with memory deficits for functional activities.  ??   4.Assess visual perception skills via MVPT, dynavision and keystone to make appropriate recommendations for vision and community reentry.??   5.Patient will be knowledgeable of  community based/wellness program.????????????????????????????????????????????                  Services Provided/Billing:   CPT Qty Min Procedure   CPT Qty Procedure   Evaluation/ReEvaluation   Application of Modalities Untimed Document type/mins   [id number]   OT Evaluation - low complexity  [id number]  Unattended E Stim / IFC / HVPC / MFAC   97166   OT Evaluation - moderate complexity  97010  Cold Pack (document pre/post tx)   97167     OT Evaluation- high complexity   97010   Hot Pack (document pre/post tx)   97168     OT Re-Evaluation   97018   Paraffin (document pre/post tx)   Therapeutic Procedures Each 15 Minutes   97022   Whirlpool (document mins)   97110     Therapeutic Exercises   97150   Group Therapy (document mins)   97112     Neuromuscular Re-ed         97760     Orthotic Training/Fitting         97530 3 43 Therapeutic Activity   Other   97532     Development of Cog Skills           97035     Ultrasound  54098   E-stim; attended       [phone]     Self Care           97140   Manual Therapy       [phone]     Wheelchair Management                 Treatment Diagnosis:   1. Impaired mobility and activities of daily living         Timed Code Treatment Minutes: 43 minutes  Total Treatment Time: 43 minutes  Total Number of Billed CPT Codes: 3       Therapist Signature: Tressia Miners, OTR/L   Date of Treatment: 08/30/2015

## 2015-09-04 ENCOUNTER — Encounter: Admit: 2015-09-04 | Discharge: 2015-09-04 | Payer: MEDICAID

## 2015-09-04 DIAGNOSIS — I639 Cerebral infarction, unspecified: Secondary | ICD-10-CM

## 2015-09-04 NOTE — Unmapped (Signed)
Patient was here on time for his 8am speech evaluation and the evaluation was initiated: allergies and medications were reviewed, the doc flowsheet was initiated.  However, pt left the room 1x in the first 15 min to have emesis (and was in the rest room with emesis prior to the beginning of the session). After his return the 2nd time, the SLP informed the patient that today's session would be re-scheduled due to his illness. The patient stated understanding and agreement. No charge for today's visit.    Lorel Monaco MACCCSLP [phone]  [facility] Graduate Clinician

## 2015-09-04 NOTE — Unmapped (Signed)
Pt left early from previous therapy session due to illness    Audry Riles, OT

## 2015-09-06 ENCOUNTER — Encounter: Admit: 2015-09-06 | Discharge: 2015-09-06 | Payer: MEDICAID

## 2015-09-06 ENCOUNTER — Inpatient Hospital Stay: Admit: 2015-09-06 | Payer: MEDICAID

## 2015-09-06 DIAGNOSIS — I639 Cerebral infarction, unspecified: Secondary | ICD-10-CM

## 2015-09-06 DIAGNOSIS — Z7409 Other reduced mobility: Secondary | ICD-10-CM

## 2015-09-06 DIAGNOSIS — I6521 Occlusion and stenosis of right carotid artery: Secondary | ICD-10-CM

## 2015-09-06 NOTE — Unmapped (Signed)
Occupational Therapy Daily Treatment Note      Name: [name]     DOB: Nov 22, 1955  MRN: [id number]     Referring Physician:  Ellwood Sayers, MD  [address]  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 Reports nerve pain  Objective: Pain:  Pre-treatment:  5-6/10  Location: RUE and RLE      Skilled interventions: For each CPT code delivered, document skilled interventions including multiple units per code and support for modifier -59:     The Symbol Digit Modalities Test (SDMT) is a substitution test measure for screening cerebral dysfunction that involves communication between both hemispheres of the brain, converting geometric designs (right hemisphere) into written and/or oral number responses (left hemisphere).  The test requires elements of attention, visuoperceptual processing, working memory, and psychomotor speed.  The age and education related scoring norms are standardized.  A score of 1.0 standard deviations below the mean is considered a low score.  A score of 1.5 standard deviations below the mean is considered a moderately low score.  A score of 2.0 standard deviations below the mean is considered a very low score.  As a general rule, score that fall at or below -1.5 standard deviations  from the mean for a given age and education level are indicative of possible cerebral dysfunction. Scored 36. This score is -1.0 SD below norm for his age and education. This has improved since previous test. This may be patient baseline based on type of work and education.    Trail A (29 seconds = average; >78 seconds = deficient).  Trail B (75 seconds = average; >273 seconds (4.55 minutes) = deficient).  It is unnecessary to continue the test if the patient has not completed both parts after 5 minutes have elapsed.  Mohmed Earline Mayotte completed Trail Making A in 28 seconds which is in the normal range.  his score on Trail Making B: unable to accurately complete.Mistakes started at #6, multiple errors    therapeutic activity Shorted Blessed Test (SBT):  A short screening test measuring orientation and memory and designed to assess cognitive impairment. The test can be used to detect early cognitive changes associated with Alzheimer's disease or other disorder characterized by dementia. Galvin O Larner's score on the SBT was 6 which is indicative of questionable cognitive impairmant per test interpretation scores. Patient unable to recall name and street #.  (SBT Test Scores:  0 - 4 = normal cognition; 5 - 9 = questionable impairment; > 10 = impairment consistent with dementia)    Patient reports decreased issues with his memory, misplacing things.Patient now using timers on his phone, mediset , organizational strategies to regain control and manage personal issues rather than relying  on his girlfriend.        Hand strengthening Issued putty for strengthening grip and desensitization related to nerve pain he is experiencing. Reviewed desensitization principles to reduce pain in finger.     Pain:   Post - treatment: 5-6/10  Location: RUE and RLE    Education provided (to patient staff, caregivers. Specify person, place, type of training provided): perceptual/cognitive retraining, therapeutic activity and patient/family  education      Assessment: MARV ALFREY exhibits overall mild cognitive impairment , impairment with visual processing speed per his SDMT scores which was in the very low range for his age and education level. Delayed visual processing. Discussed concerns for driving.      Plan/Focus for Continued Treatment:   Frequency/Duration of Treatment: Patient will participate in OT 2x/week x 4 weeks.  Pt requires skilled OT to address the following:  ADL's  Cognitive/ Skills  Therapeutic exercise  Therapeutic Activities  Community/ Work re-intergration  Neuromuscular re-education     Therapy Goals:  ????  Short Term Goals to be met within 2 visits:??   1. Patient will demonstrate independence with progressive HEP for RUE strengthening to address functional deficits with continued progress after discharge.?? ??     ????????????????????????  Long Term Goals to be met within 4 weeks: ??   1.Patient will demonstrate INDEP in home management (meals, laundry, shopping) with good safety, compensatory strategies for memory.??   2.Increase strength (MMT 4/5 to 5/5, grip: increase by 10#)??   3.Assess cognitive skills next session via MOCA, SBT, SDMT,TRAILS. Patient will INDEP verbalize understanding of cognitive compensatory strategies to assist with memory deficits for functional activities.  ??   4.Assess visual perception skills via MVPT, dynavision and keystone to make appropriate recommendations for vision and community reentry.??   5.Patient will be knowledgeable of community based/wellness program.????????????????????????????????????????????                  Services Provided/Billing:   CPT Qty Min Procedure   CPT Qty Procedure   Evaluation/ReEvaluation   Application of Modalities Untimed Document type/mins   [id number]   OT Evaluation - low complexity  G0283  Unattended E Stim / IFC / HVPC / MFAC   97166   OT Evaluation - moderate complexity  97010  Cold Pack (document pre/post tx)   97167     OT Evaluation- high complexity   97010   Hot Pack (document pre/post tx)    97168     OT Re-Evaluation   97018   Paraffin (document pre/post tx)   Therapeutic Procedures Each 15 Minutes   97022   Whirlpool (document mins)   97110  1 13  Therapeutic Exercises   97150   Group Therapy (document mins)   97112     Neuromuscular Re-ed         97760     Orthotic Training/Fitting         97530 2 30 Therapeutic Activity   Other   97532     Development of Cog Skills           97035     Ultrasound           97032   E-stim; attended       97535     Self Care           97140   Manual Therapy       [phone]     Wheelchair Management  Treatment Diagnosis:   1. Impaired mobility and activities of daily living     2. Right arm weakness         Timed Code Treatment Minutes: 43 minutes  Total Treatment Time: 43 minutes  Total Number of Billed CPT Codes: 3       Therapist Signature: Tressia Miners, OTR/L   Date of Treatment: 09/06/2015

## 2015-09-06 NOTE — Unmapped (Addendum)
Diagnosis:   1. Ischemic stroke           Referring Provider:Goranovich, Jill Alexanders, MD   Dr. Orlin Hilding,    For f/u appt & RTW   Insurance plan:   Payor/Plan Subscr DOB Sex Relation Sub. Ins. ID Effective Group Num   1. WORKERS COMP [name], [name] [birth year] Male  [id number] 07/25/15                                       2. [name] COMMU* [name], [name] 15-Mar-1955 Male  [id number] 09/28/01                                    [address]                      # of visits per insurance authorization:[place] 30V  # of visits per POC: 4-6   Date of Initial Eval: 08/11/15     Precautions:     Activity Date:08/18/15 Date: 08/25/15 08/30/15 09/06/15      Visit # 3 4 5 6          Plan 1-2 more visits  P. Note to MD &  RTW recommend     CPT code  Daily treatment record Treatment Time   NM re-educ     Therapeutic Exercise: 97110   review of HEP for core stab, LE strengthening/flexibility, lumbar mobility  Brdiging x5, then  Sustained bridging w marching x20 w/o incr pain    Supine core stab w slow bicycle after S-Dktc lift ; TC for correct TA activation & appropriate amt of 'reach out' w LE to maintain LP stab    Quadruped: alt hip exten x10 - able to add opp arm lifts w mild instab noted    -leg press - x20 each     RLE 45#; LLE 50#    stretches to address low back pain: deferred today :  POE stretch H60 & then press ups to full ROM (H10) now x5   gastroc stretch: runner's stretch or edge of step H60 x2  Child pose stretch H 60 x2; VC for knees apart & allow lumbar exten vs incr rounding of back during stretch  Knee rolls x3, H30  Hamstring stretch, seated & @ stairs w FOCUS on maint ant pelvic tilt/ lumbar lordosis       20'                                 Therapeutic Activities: 97530 Lifting: x 5 ea:   Ankles to waist  30#  Good body mechanics    Able to carry & set up step ladder indep;  Then climb  Up/down  Ladder to 3rd step while carry 10# , 10x        10'   Gait training     Neuromuscular Re-education: [phone] Walk ag waist  belt: Backward 30#, Fwd 12#, - and while carrying 15# load   side 12# with SBA due to occass mild loss of control as returned to starting point  Each x7 reps   15'  Cont next visit    Pt response to tx today: pt did not indicate incr LBP with any task;  signif challenge for core/hip  stab with resisted walking F/S     Total Treatment TIme:  50'     Subj:  Patient reports in the last 3 days has felt sl off balance @ times - LE feeling less stable.  Came today using cane.  States his CVA f/u is not until October ( wants to do light duty sooner)  Pain: 5 /10 Location: LE's  ;  Injured finger 8/10    Additional Information: 08/24/15: patient required minimal cues for body mechanics   Pt discusses hx of pinched nerve in neck with intermittent RUE N&T & prior TIA's (seen on recent MRI).    Patient Education:   Demonstrated for pt return to community gym ex:  Row machine & chest press w focus on maint upright spinal posture & core stab  Leg press (see above)  HEP -Quadruped opp arm/leg lifts, tall kneel & 1/2 kneel balance tasks w arm lifts/reaches:  POE & partial press up stretches  Sustained bridging w marching  Supine core stab w slow bicycle    Initial Summary:  Patient presents with?? RLE weakness & chronic LBP/neck pain contributing to functional limitations including inability to perform job duties & limited ambul .    Short Term Goals:?? ?? Initial status 08/11/15?? ??09/06/15   1.?? Within 3-5 visits Patient will perform?? initial home exercise program with minimal verbal cues, to address functional deficits & continue with progress after discharge?? Needs structured home exercise program to address deficits ?? ??HEP issued  ? Compliance as unable to demonstrate yet w/o mod cues   ????  longTerm Goals: time frame 3wks?? Status?? 08/11/15?? ??   1. Improve RLE strengths to 5/5 and demonstrates good core support during lifting & job simulation tasks.  Able to lift 25# ankle to chest safely, with good body mechanics,  Leg press w RLE  to 45#  ?? Core & RLE weakness (3/5 to 4+/5) and cramping which limits?? ability to do job duties & results in poor posture & contributes to chronic spinal pain.  Leg Press: L 50#, R 30#?? ??MMT LE's wnl except: B glut max 5-/5: ??and RLE psoas 4-+5 , ??   gastroc:  4/5 R,  5-/5 L  Leg Press: RLE 45#; LLE 50#  Lifting:     Ankles to waist  25#  Waist to head level 18#       Carrying 20# for 100' x 2 ,   and then  Up/down 5 steps        Also carried 10# up/down 3 steps on step ladder indep.   2. indep ambul on all surfaces w/o AD?? Currently ambul with std cane.?? ?? gait outside,10 min 08/30/2015 - ambul on level & inclined grassy surfaces, multiple directions with no decr speed, instab or difficulty     Can walk on TM @ 2.20mph

## 2015-09-06 NOTE — Unmapped (Signed)
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    PMH: Reviewed past medical history in the chart.    PLF: Patient lives with his girlfriend and his son. Prior to his CVA, he had been working full-time for an [employer]) for the last 3 years. 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 Pt reports he did graduate from high school (but stated to OT that he did not graduate from high school). Pt reports he continues to experience STM deficits since his CVA.    [facility] SLP EVAL 09/06/2015   Evaluation Tools Used Informal Battery;Other - See comments   Vision Glasses   Hearing WFL   Hand Dominance Right   Reports Pain Yes   Pain Rating 8   Per Numeric 1-10 Scale   Location of Pain lower back & finger   Receptive Language WFL   Expressive Language [facility]   Reading Comprehension DNT   Written Expression DNT   Attention Moderate 50-75%   Short Term Memory Mild 75-90%   Long Term Memory / Autobiographical WFL   Problem Solving WFL   Safety Awareness WFL   Reasoning Mild 75-90%   Organization Moderate 50-75%   Executive Functions Moderate 50-75%   Insight WFL   Initiation/Impulsivity WFL   Math Moderate 50-75%   Oriented Self;Hospital;City;Year;Month;Day;Situation   Oral Motor Exam DNT   Speech/Voice WFL   Swallowing DNT     Results: The SLUMS was administered this date with patient receiving a score of 19/30.  This score represents moderate impairment and is substantially lower than his score of 25/30 on the [facility] (a very similar assessment with a normal range of 26-30) administered by OT on 08/28/15. This date, pt had difficulty with functional math (unable to calculate how much money he had left  after making a purchase), backwards digit repetition (accurate for 2 digit number but not for 3 or 4 digit numbers), drawing a clock (numbers correct but reversed long and short hands) and immediate recall of a short story read aloud (but delayed recall of 5 unrelated words was [facility]).  Motor speech skills were informally assessed and were WFLs.  Of note, the patient reported contradictory information this date to the PT whom he saw immediately prior to this session as far as what his current job entails and what light duty would entail if he was released to return to light duty.    Strategies:  None needed for swallowing    Medications reviewed in Epic on 09/06/2015.    Method for taking po medication:  Whole with thin liquid    Instrumental Dysphagia Exam:  No, Why not?  Patient denies difficulties.    Education Activities and Response: Patient (unaccompanied) educated to results of eval and recommended POC.  Pt as escorted to scheduling to add additional ST visits.    Learning Assessment:  [name] is able to communicate with therapist and verbalize understanding of directions / instructions.  and Is an interpreter required?  No    Patient received education on the purpose of therapy, participated in the development of the POC and verbalized understanding and agreement of POC, goals.  Patient goal:  I need to gte back to light duty ASAP as I've got no money coming in.    Functional Deficits:   Attention--  moderate  Short Term Memory--  moderate  Executive Dysfunction--  moderate    Potential Benefits of Treatment: Potential return to work    Barriers To Progress: Providing contradictory information to therapists, appears to have had a substantial decline in status since his OT eval via the MOCA on 08/28/15 in the absence of any new reported symptoms    Rehab Potential:   fair  and Due to:   above barriers     Outpatient Speech Language Pathology Long-term Treatment Goals    Cognitive-Linguistic  Patient will  complete basic attention task with 90%.  Patient will complete short term memory task with 90%.  Patient will complete basic executive function/ reasoning/planning task with 90%.  Patient will complete the RBMT-3 to further assess short term memory strengths/weaknesses.    Education  Patient/caregivers will demonstrate understanding of role of speech therapy diagnosis, prognosis, home exercise programs, plan of care, recommendations, and safety precautions.    Plan:  Functional Communication Cognitive Linguistic Therapy  Frequency: 2 times per week for 4 weeks    Therapist Signature: Lorel Monaco [id number] [id number]     Date: 09/06/2015    Physician Certification:   I certify that the above patient is under my care and requires the above services.  These professional services are to be provided from an established plan, related to the diagnosis and reviewed by me every 10th visit or every 90 days, whichever occurs first.    Physician Comments/Revisions:    Physician Name (printed):   ____________________________  Physician Signature: ___________________________    Date:__________________

## 2015-09-07 NOTE — Unmapped (Signed)
Physical Therapy Progress Note    Patient Name: [name]     ZO#:[id number]   DOB: 30-Aug-1955  Diagnosis:   1. Ischemic stroke       Physician Name: Ellwood Sayers, MD  Cc: Pearlean Brownie MD - scheduled to be seen 11/16/15    Date: 09/07/2015    # of visits completed: 6 # cancels/ no shows:   [name]    Short Term Goals:?? ?? Initial status 08/11/15?? ??09/06/15   1.?? Within 3-5 visits Patient will perform?? initial home exercise program with minimal verbal cues, to address functional deficits & continue with progress after discharge?? Needs structured home exercise program to address deficits ?? ??HEP issued  ? Compliance as unable to demonstrate yet w/o mod cues   ????  longTerm Goals: time frame 3wks?? Status?? 08/11/15?? ??   1. Improve RLE strengths to 5/5 and demonstrates good core support during lifting & job simulation tasks.  Able to lift 25# ankle to chest safely, with good body mechanics,  Leg press w RLE to 45#  ?? Core & RLE weakness (3/5 to 4+/5) and cramping which limits?? ability to do job duties & results in poor posture & contributes to chronic spinal pain.  Leg Press: L 50#, R 30#?? ??MMT LE's wnl except: B glut max 5-/5: ??and RLE psoas 4-+5 , ??   gastroc:  4/5 R,  5-/5 L  Leg Press: RLE 45#; LLE 50#  Lifting:     Ankles to waist  25#  Waist to head level 18#       Carrying 20# for 100' x 2 ,   and then  Up/down 5 steps        Also carried 10# up/down 3 steps on step ladder indep.   2. indep ambul on all surfaces w/o AD?? Currently ambul with std cane.?? ?? gait outside,10 min 08/30/2015 - ambul on level & inclined grassy surfaces, multiple directions with no decr speed, instab or difficulty     Can walk on TM @ 2.43mph              Medication Reviewed: [x]  yes [] no    Assessment:    [name] has made excellent progress toward above goals.  Physical therapy has been focusing on strength, gait, balance & RTW skills..    Pt. is appropriate to continue skilled PT for a few more sessions, then anticipate D/C.  Goals are  met or nearly met in PT.    Pt is highly motivated to RTW asap due to current financial burden of being out of work.  He states he may have an opportunity to participate in a light duty position right now (he was unable to clarify the details of this job), but needs MD clearance to do so.  (see also Speech Therapy Eval)    RECOMMEND pt return to MD sooner than current appt date of October 19 (Dr Orlin Hilding) to discuss RTW with MD.    Rehabilitation Potential:  good        Recommendations and Treatment Plan:   Continue with current goals and POC  1 times/week for 2 weeks  Continue to update and progress home exercise program    Physical Therapist Signature:  Reynoldo Mainer P Tiger Spieker       Date:  09/07/2015  _______________________________________________________________________    Physician Certification  I certify that the above patient is under my care an requires the above services. These professional services are to be provided from an  established plan, related to the diagnosis and reviewed by me every 90 days.   Additional comments/revisions:     Physician Name: Ellwood Sayers, MD  Cc: Pearlean Brownie MD    Signature___________________________________________________ Date:_______

## 2015-09-11 ENCOUNTER — Encounter: Admit: 2015-09-11 | Discharge: 2015-09-11 | Payer: MEDICAID

## 2015-09-11 DIAGNOSIS — I69319 Unspecified symptoms and signs involving cognitive functions following cerebral infarction: Secondary | ICD-10-CM

## 2015-09-11 DIAGNOSIS — Z7409 Other reduced mobility: Secondary | ICD-10-CM

## 2015-09-11 DIAGNOSIS — I639 Cerebral infarction, unspecified: Secondary | ICD-10-CM

## 2015-09-11 NOTE — Unmapped (Signed)
Diagnosis:   1. Ischemic stroke           Referring Provider:Goranovich, Jill Alexanders, MD   Dr. Orlin Hilding,    For f/u appt & RTW   Insurance plan:   Payor/Plan Subscr DOB Sex Relation Sub. Ins. ID Effective Group Num   1. WORK[employer] [name], [name] May 27, 1955 Male  [id number] 07/25/15                                       2. [name] COMMU* [name], [name] [birth year] Male  [id number] 09/28/01                                    [address]                      # of visits per insurance authorization:[place] 30V  # of visits per POC: 4-6   Date of Initial Eval: 08/11/15     Precautions:     Activity Date:08/18/15 Date: 08/25/15 08/30/15 09/06/15 09/11/15     Visit # 3 4 5 6 7         Plan 1-2 more visits  P. Note to MD &  RTW recommend     CPT code  Daily treatment record Treatment Time   NM re-educ     Therapeutic Exercise: 97110   review of HEP for core stab, LE strengthening/flexibility, lumbar mobility  Brdiging x5, then  Sustained bridging w marching x20 w/o incr pain    Supine core stab w slow bicycle after S-Dktc lift x 20    Quadruped: alt hip exten x10, alt hip ext + opp UE lift x 10 with CGA d/t mild instability    -leg press -      RLE 40# x 15 then 50# x 15; LLE 50# x 15 then 60# x 15; cues for slower controlled motions         16 min                             Therapeutic Activities: 97530     Gait training     Neuromuscular Re-education: 97112 Walk ag waist belt: Backward 30# x 8, Fwd 12.5# x 5 then while carrying 15# load/crate x 5,   side 12# x 8 reps with SBA due to occass mild loss of control as returned to starting point; cues for inc'd LLE step with fwd to encourage inc'd R LE stance stability training    Functional squat lifts 15# crate from floor to wait 24 min    Pt response to tx today:      Total Treatment Time:  40 min     Subj:  Pt states he feels good and strong when he leaves each session, but feels weaker throughout the week between sessions  Pain: 5 /10 Location: LE's     Additional Information:  08/24/15: patient required minimal cues for body mechanics   Pt discusses hx of pinched nerve in neck with intermittent RUE N&T & prior TIA's (seen on recent MRI).    Patient Education:   Demonstrated for pt return to community gym ex:  Row machine & chest press w focus on maint upright spinal posture & core stab  Leg press (see above)  HEP -Quadruped opp arm/leg lifts, tall kneel & 1/2 kneel balance tasks w arm lifts/reaches:  POE & partial press up stretches  Sustained bridging w marching  Supine core stab w slow bicycle    Initial Summary:  Patient presents with?? RLE weakness & chronic LBP/neck pain contributing to functional limitations including inability to perform job duties & limited ambul .    Short Term Goals:?? ?? Initial status 08/11/15?? ??09/06/15   1.?? Within 3-5 visits Patient will perform?? initial home exercise program with minimal verbal cues, to address functional deficits & continue with progress after discharge?? Needs structured home exercise program to address deficits ?? ??HEP issued  ? Compliance as unable to demonstrate yet w/o mod cues   ????  longTerm Goals: time frame 3wks?? Status?? 08/11/15?? ??   1. Improve RLE strengths to 5/5 and demonstrates good core support during lifting & job simulation tasks.  Able to lift 25# ankle to chest safely, with good body mechanics,  Leg press w RLE to 45#  ?? Core & RLE weakness (3/5 to 4+/5) and cramping which limits?? ability to do job duties & results in poor posture & contributes to chronic spinal pain.  Leg Press: L 50#, R 30#?? ??MMT LE's wnl except: B glut max 5-/5: ??and RLE psoas 4-+5 , ??   gastroc:  4/5 R,  5-/5 L  Leg Press: RLE 45#; LLE 50#  Lifting:     Ankles to waist  25#  Waist to head level 18#       Carrying 20# for 100' x 2 ,   and then  Up/down 5 steps        Also carried 10# up/down 3 steps on step ladder indep.   2. indep ambul on all surfaces w/o AD?? Currently ambul with std cane.?? ?? gait outside,10 min 08/30/2015 - ambul on level & inclined  grassy surfaces, multiple directions with no decr speed, instab or difficulty     Can walk on TM @ 2.42mph

## 2015-09-11 NOTE — Unmapped (Signed)
Name:[name] DOB:24-May-1955 AVW:[id number]  Referring Provider: Dr. Orlin Hilding  Primary Diagnosis: CVA  Onset Date: 07/28/15  Diagnosis:  1. 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       Not addressed      Patient will complete short term memory task with 90%. Not addressed      Patient will complete basic executive function/ reasoning/planning task with 90%. Not addressed      Patient will complete the RBMT-3 to further assess short term memory strengths/weaknesses. Not addressed      Patient/caregivers will demonstrate understanding of role of speech therapy diagnosis, prognosis, home exercise programs, plan of care, recommendations, and safety precautions. Not addressed        ________________________________________________________________________  Visit #: 2      Patient was checked in for his 12:30pm speech visit and had completed both his OT and PT visits earlier this date. However, when SLP went to locate pt for his speech session, he could not be found.  Patient was a no show for today's visit.  This is patient's no show.    Education Addressed During This Session: N/A      This note serves as a discharge summary if the patient does not return to Speech Therapy per the above POC.    Lorel Monaco MACCCSLP [phone]    [facility]

## 2015-09-11 NOTE — Unmapped (Signed)
Occupational Therapy Daily Treatment Note  Name: [name]     DOB: Jun 01, 1955  MRN: [id number]     Referring Physician:  Ellwood Sayers, MD  [address]  [place], [place] [address]  Phone: [phone]  Fax:      Primary Diagnosis: CVA    Precautions: n/a    Total Visits: 7/8  Progress Note Due (every 30 days from first tx session after initial eval): 09/09/15  Insurance: Payor: [employer] / Plan: [facility] COMMUNITY HEALTH / Product Type: Medicaid [place] care /   07.10.17/AU/NNB/[id number]/BENEFIT AUTH INFO  PT,OT,ST- Restaurant manager, fast food COMMUNITY HEALTH MEDICAID [place]  EFF DATE: July   IN NETWORK   CO-INS: 100%  NO YRLY /LTM/ PRE-EXISTING   PT GETS 30 V EACH PT/OT/ST PER CAL YR, USED 0  THEN PRECERT IS REQUIRED EVEN FOR EVALS    Subjective: Patient states he made appointment with hand doctor to address pain in left hand from crush injury.  Objective: Pain:  Pre-treatment:  7/10  Location: RUE and RLE        therapeutic activity Patient reports improved memory, not having issues with misplacing things.Patient now using timers on his phone, mediset , organizational strategies to regain control and manage personal issues rather than relying on his girlfriend. Patient states he is no longer relying on girlfriend for any BADL's or IADL's.    Education provided for cognitive compensatory strategies. Patient incorporating with 90% accuracy.       Hand strengthening States he has appointment scheduled for second opinion. Reviewed putty for strengthening grip and desensitization related to nerve pain.      Pain:   Post - treatment: 5-6/10  Location: RUE and RLE    Education provided (to patient staff, caregivers. Specify person, place, type of training provided): perceptual/cognitive retraining, therapeutic activity and patient/family education      Assessment: [name] exhibits overall mild cognitive impairment , impairment with visual processing speed per his SDMT scores  which was in the very low range for his age and education level. Delayed visual processing. Discussed concerns for driving.      Plan/Focus for Continued Treatment:   Frequency/Duration of Treatment: Patient will participate in OT 2x/week x 4 weeks.  Pt requires skilled OT to address the following:  ADL's  Cognitive/ Skills  Therapeutic exercise  Therapeutic Activities  Community/ Work re-intergration  Neuromuscular re-education     Therapy Goals:  ????  Short Term Goals to be met within 2 visits:??   1. Patient will demonstrate independence with progressive HEP for RUE strengthening to address functional deficits with continued progress after discharge.?? ??     ????????????????????????  Long Term Goals to be met within 4 weeks: ??   1.Patient will demonstrate INDEP in home management (meals, laundry, shopping) with good safety, compensatory strategies for memory.??   2.Increase strength (MMT 4/5 to 5/5, grip: increase by 10#)??   3.Assess cognitive skills next session via MOCA, SBT, SDMT,TRAILS. Patient will INDEP verbalize understanding of cognitive compensatory strategies to assist with memory deficits for functional activities.  ??   4.Assess visual perception skills via MVPT, dynavision and keystone to make appropriate recommendations for vision and community reentry.??   5.Patient will be knowledgeable of community based/wellness program.????????????????????????????????????????????                  Services Provided/Billing:   CPT Qty Min Procedure   CPT Qty Procedure   Evaluation/ReEvaluation   Application of Modalities Untimed Document type/mins  47829   OT Evaluation - low complexity  G0283  Unattended E Stim / IFC / HVPC / MFAC   97166   OT Evaluation - moderate complexity  97010  Cold Pack (document pre/post tx)   97167     OT Evaluation- high complexity   97010   Hot Pack (document pre/post tx)   97168     OT Re-Evaluation   97018   Paraffin (document pre/post tx)   Therapeutic Procedures Each 15 Minutes   97022   Whirlpool (document mins)    97110  1 12 Therapeutic Exercises   97150   Group Therapy (document mins)   97112     Neuromuscular Re-ed         97760     Orthotic Training/Fitting         97530 2 30 Therapeutic Activity   Other   97532     Development of Cog Skills           97035     Ultrasound           97032   E-stim; attended       97535     Self Care           97140   Manual Therapy       97542     Wheelchair Management                 Treatment Diagnosis:   1. Impaired mobility and activities of daily living     2. Right arm weakness         Timed Code Treatment Minutes: 42 minutes  Total Treatment Time: 42 minutes  Total Number of Billed CPT Codes: 3       Therapist Signature: Tressia Miners, OTR/L   Date of Treatment: 09/11/2015

## 2015-09-12 ENCOUNTER — Ambulatory Visit: Admit: 2015-09-12 | Discharge: 2015-09-12 | Payer: Worker's Compensation

## 2015-09-12 ENCOUNTER — Inpatient Hospital Stay: Admit: 2015-09-12 | Payer: Worker's Compensation

## 2015-09-12 DIAGNOSIS — M79642 Pain in left hand: Secondary | ICD-10-CM

## 2015-09-12 DIAGNOSIS — S62637D Displaced fracture of distal phalanx of left little finger, subsequent encounter for fracture with routine healing: Secondary | ICD-10-CM

## 2015-09-12 NOTE — Unmapped (Signed)
Chief Complaint   Patient presents with   ??? New Patient Visit/ Consultation     L Hand Nerve pain          HPI  this is a very pleasant gentleman who is a very complex history. Apparently on 07/25/2015 had a 1500 pound object at work fall on his left hand. He had a nail bed injury which was cared for at [facility] and then followed once by Dr. Venetia Maxon.  In the interim, he had a stroke. He is here to follow-up with complains about numbness in his left small finger and pain.  His larger complaint is that he thinks his stroke was caused by the pain from his finger injury and he wants this to keep him off of work during his stroke rehabilitation.       Review of Systems   Constitutional: Negative.    Eyes: Negative.    Respiratory: Negative.    Cardiovascular: Negative.    Gastrointestinal: Positive for constipation.   Genitourinary: Negative.    Musculoskeletal: Positive for joint pain.   Skin: Negative.    Neurological: Positive for tingling, sensory change and headaches.   Endo/Heme/Allergies: Negative.    Psychiatric/Behavioral: Negative.          Past Medical History   Diagnosis Date   ??? Hypertension    ??? Diabetes mellitus    ??? COPD (chronic obstructive pulmonary disease)    ??? GERD (gastroesophageal reflux disease)    ??? Hepatitis    ??? Dysphagia          Past Surgical History   Procedure Laterality Date   ??? Foot surgery Left    ??? Esophagogastroduodenoscopy N/A 11/19/2013     Procedure: ESOPHAGOGASTRODUODENOSCOPY WITH MAC;  Surgeon: Charlane Ferretti, MD;  Location: [facility];  Service: Gastroenterology;  Laterality: N/A;   ??? Hernia repair       about 5 yrs ago         Current Outpatient Prescriptions   Medication Sig   ??? ALBUTEROL INHL Inhale into the lungs.   ??? amLODIPine Take 10 mg by mouth daily   ??? aspirin Take 1 tablet (81 mg total) by mouth daily.   ??? atorvastatin Take 1 tablet (80 mg total) by mouth at bedtime.   ??? blood sugar diagnostic Test daily   ??? blood-glucose meter by Does not apply route.   ???  buPROPion HCl Take 100 mg by mouth 2 times daily Ran out of prescription   ??? entecavir Take 1 tablet (0.5 mg total) by mouth daily.   ??? FLUoxetine    ??? hydroCHLOROthiazide Take 1 capsule (12.5 mg total) by mouth daily.   ??? ibuprofen Take 0.5 tablets (400 mg total) by mouth every 6 hours as needed for up to 16 doses.   ??? ketoconazole    ??? loratadine Take 1 tablet by mouth daily   ??? losartan Take 1 tablet (100 mg total) by mouth daily. Take 100 mg by mouth daily   ??? metFORMIN Take 500 mg by mouth 2 times a day with meals.   ??? ondansetron Take 1 tablet (4 mg total) by mouth every 6 hours as needed for Nausea.   ??? orphenadrine Take 1 tablet (100 mg total) by mouth 2 times a day.   ??? oxyCODONE Take 1 tablet (5 mg total) by mouth every 4 hours as needed for Pain.   ??? oxyCODONE-acetaminophen 1 tablet every 4 hours as needed.      ??? tamsulosin  Take 0.4 mg by mouth daily   ??? traZODone      No current facility-administered medications for this visit.         Allergies   Allergen Reactions   ??? Penicillins Hives         Social History     Social History   ??? Marital Status: Legally Separated     Spouse Name: N/A   ??? Number of Children: N/A   ??? Years of Education: N/A     Occupational History   ??? Not on file.     Social History Main Topics   ??? Smoking status: Current Every Day Smoker -- 0.25 packs/day for 5 years     Types: Cigarettes   ??? Smokeless tobacco: Not on file   ??? Alcohol Use: No   ??? Drug Use: No   ??? Sexual Activity:     Partners: Female     Pharmacist, hospital Protection: Condom     Other Topics Concern   ??? Not on file     Social History Narrative         The following portions of the patient's history were reviewed and updated as appropriate: allergies, current medications, past family history, past medical history, past social history, past surgical history and problem list.      Physical Exam   Constitutional: He is oriented to person, place, and time. He appears well-developed and well-nourished.   HENT:   Head:  Normocephalic and atraumatic.   Eyes: Conjunctivae and EOM are normal.   Cardiovascular: Intact distal pulses.    Pulmonary/Chest: Effort normal.   Neurological: He is alert and oriented to person, place, and time.   Skin: Skin is warm and dry.         Ortho Exam  left small finger with healed nail bed injury, full active range of motion of small finger       Assessment:  Left small finger nail bed injury    Plan:  Today we obtained new x-rays. He has a nearly completely healed tuft fracture. I explained to the patient that I do not have the reasons to keep him off work. I told him that he needs to follow-up with his stroke team and with his primary care physician regarding his further care.             This note was transcribed utilizing voice recognition software. While reasonable attempts have been made to identify and correct transcription errors, they may be present.

## 2015-09-13 ENCOUNTER — Encounter: Admit: 2015-09-13 | Discharge: 2015-09-13 | Payer: MEDICAID

## 2015-09-13 DIAGNOSIS — I69319 Unspecified symptoms and signs involving cognitive functions following cerebral infarction: Secondary | ICD-10-CM

## 2015-09-13 DIAGNOSIS — I639 Cerebral infarction, unspecified: Secondary | ICD-10-CM

## 2015-09-13 DIAGNOSIS — R29898 Other symptoms and signs involving the musculoskeletal system: Secondary | ICD-10-CM

## 2015-09-13 NOTE — Unmapped (Signed)
Physical Therapy Discharge Summary    Patient Name: [name]     ZO#:[id number]   DOB: [birth year]  Diagnosis:   1. Ischemic stroke       Physician Name: Ellwood Sayers, MD    Date: 09/13/2015    # of visits completed: 8      # cancels/ no shows:       Short Term Goals:?? ?? Initial status 08/11/15?? ??09/13/15   1.?? Within 3-5 visits Patient will perform?? initial home exercise program with minimal verbal cues, to address functional deficits & continue with progress after discharge?? Needs structured home exercise program to address deficits ?? ??HEP issued  ? Compliance as unable to demonstrate yet w/o mod cues   ????  longTerm Goals: time frame 3wks?? Status?? 08/11/15?? ??09/13/15  GOALS MET   1. Improve RLE strengths to 5/5 and demonstrates good core support during lifting & job simulation tasks.  Able to lift 25# ankle to chest safely, with good body mechanics,  Leg press w RLE to 45#   Core & RLE weakness (3/5 to 4+/5) and cramping which limits?? ability to do job duties & results in poor posture & contributes to chronic spinal pain.  Leg Press: L 50#, R 30#?? ??MMT LE's wnl except: B glut max 5-/5: ??and RLE psoas 4-+5 , ??   gastroc:  4/5 R,  5-/5 L  Leg Press: BLE 50#  Lifting:     Ankles to waist  25#  Waist to head level 18#       Carrying 20# for 100' x 2 ,   and then  Up/down 5 steps        Also carried 10# up/down 3 steps on step ladder  & 25# up/down 12 step indep.   2. indep ambul on all surfaces w/o AD?? Currently ambul with std cane.?? ?? gait outside,10 min 08/30/2015 - ambul on level & inclined grassy surfaces, multiple directions with no decr speed, instab or difficulty     Can walk on TM @ 2.52mph              Assessment:    [name] has made excellent progress & has met all goals.  From physical therapy standpoint, pt is ready for d/c & capable of physical tasks for RTW per pt description of job duties.    Recommendations and Treatment Plan:   Discharge patient from POC due to:  Goals met    Physical  Therapist Signature:  Saryiah Bencosme P Kasir Hallenbeck       Date:  09/13/2015  _______________________________________________________________________    Physician Certification  I certify that the above patient is under my care an requires the above services. These professional services are to be provided from an established plan, related to the diagnosis and reviewed by me every 90 days.   Additional comments/revisions:     Physician Name: Ellwood Sayers, MD    Signature___________________________________________________ Date:_______

## 2015-09-13 NOTE — Unmapped (Signed)
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  Objective: Pain:  Pre-treatment:  5/10  Location: RUE and RLE        therapeutic activity Patient INDEP with BADL's. INDEP IADL's.  Patient states he is no longer relying on girlfriend for any assistance in home setting.       Therapeutic exercise UBE x 6 min at 30 watt resistance.    Theraband HEP with red theraband x 20 reps in all planes.     Theraputty strengthening HEP . Reviewed HEP      Pain:   Post - treatment: 5-6/10  Location: RUE and RLE    Education provided (to patient staff, caregivers. Specify person, place, type of training provided): perceptual/cognitive retraining, therapeutic activity and patient/family education      Assessment: DREAM NODAL exhibits overall mild cognitive impairment. Initiated strengthening HEP.     Plan/Focus for Continued Treatment:   Frequency/Duration of Treatment: Patient will participate in OT 2x/week x 4 weeks.  Pt requires skilled OT to address the following:  ADL's  Cognitive/ Skills  Therapeutic exercise  Therapeutic Activities  Community/ Work re-intergration  Neuromuscular re-education      Therapy Goals:  ????  Short Term Goals to be met within 2 visits:??   1. Patient will demonstrate independence with progressive HEP for RUE strengthening to address functional deficits with continued progress after discharge.?? ??     ????????????????????????  Long Term Goals to be met within 4 weeks: ??   1.Patient will demonstrate INDEP in home management (meals, laundry, shopping) with good safety, compensatory strategies for memory.??   2.Increase strength (MMT 4/5 to 5/5, grip: increase by 10#)??   3.Assess cognitive skills next session via MOCA, SBT, SDMT,TRAILS. Patient will INDEP verbalize understanding of cognitive compensatory strategies to assist with memory deficits for functional activities.  ??   4.Assess visual perception skills via MVPT, dynavision and keystone to make appropriate recommendations for vision and community reentry.??   5.Patient will be knowledgeable of community based/wellness program.????????????????????????????????????????????                  Services Provided/Billing:   CPT Qty Min Procedure   CPT Qty Procedure   Evaluation/ReEvaluation   Application of Modalities Untimed Document type/mins   [id number]   OT Evaluation - low complexity  [id number]  Unattended E Stim / IFC / HVPC / MFAC   97166   OT Evaluation - moderate complexity  97010  Cold Pack (document pre/post tx)   62130     OT Evaluation- high complexity   97010  Hot Pack (document pre/post tx)   [id number]     OT Re-Evaluation   97018   Paraffin (document pre/post tx)   Therapeutic Procedures Each 15 Minutes   97022   Whirlpool (document mins)   97110 2 30 Therapeutic Exercises   97150   Group Therapy (document mins)   97112     Neuromuscular Re-ed         97760     Orthotic Training/Fitting         97530 1 14 Therapeutic Activity   Other   97532     Development of Cog Skills           97035     Ultrasound           97032   E-stim; attended       97535     Self Care           97140   Manual Therapy       [phone]     Wheelchair Management                 Treatment Diagnosis:    1. Right arm weakness     2. Impaired mobility and activities of daily living         Timed Code Treatment Minutes: 44 minutes  Total Treatment Time: 44 minutes  Total Number of Billed CPT Codes: 3       Therapist Signature: Tressia Miners, OTR/L   Date of Treatment: 09/13/2015

## 2015-09-13 NOTE — Unmapped (Signed)
Name:[name] DOB:[birth year] VWU:[id number]  Referring Provider: Dr. Orlin Hilding  Primary Diagnosis: CVA  Onset Date: 07/28/15  Diagnosis:  1. 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       Not addressed 1.Cancellation task (k,u,y): 28/31=90%  2. ID errors on lunch menu: 19/28-68%     Patient will complete short term memory task with 90%. Not addressed 1. Verbal: 2 step/4 component directions: 29/34=85%     Patient will complete basic executive function/ reasoning/planning task with 90%. Not addressed Schedule planning given specified criteria: 8/9=89%.  New HW: ID picture described by inferential reasoning x6 pictures     Patient will complete the RBMT-3 to further assess short term memory strengths/weaknesses. Not addressed Not addressed     Patient/caregivers will demonstrate understanding of role of speech therapy diagnosis, prognosis, home exercise programs, plan of care, recommendations, and safety precautions. Not addressed See below       ________________________________________________________________________  Visit #: 3      Education Addressed During This Session: Pt (unaccompanied) stated he left the building after Monday's OT & PT sessions and was not aware that he still had ST that day. Pt was given a new printed copy of his schedule and appointments were reviewed with pt.      This note serves as a discharge summary if the patient does not return to Speech Therapy per the above POC.    Lorel Monaco MACCCSLP [phone]

## 2015-09-13 NOTE — Unmapped (Signed)
Diagnosis:   1. Ischemic stroke           Referring Provider:Goranovich, Jill Alexanders, MD   Dr. Orlin Hilding,    For f/u appt & RTW   Insurance plan:   Payor/Plan Subscr DOB Sex Relation Sub. Ins. ID Effective Group Num   1. WORKERS COMP KALIJAH, ZEISS 1955-07-17 Male  [id number] 07/25/15                                       2. [name] COMMU* LA, DIBELLA March 16, 1955 Male  [id number] 09/28/01                                    [address]                      # of visits per insurance authorization:[place] 30V  # of visits per POC: 4-6   Date of Initial Eval: 08/11/15     Precautions:     Activity Date:08/18/15 Date: 08/25/15 08/30/15 09/06/15 09/11/15 09/13/15    Visit # 3 4 5 6 7 8          Plan 1-2 more visits  P. Note to MD &  RTW recommend     CPT code  Daily treatment record Treatment [place]   [place] [address] Quadruped: alt hip exten x10, alt hip ext + opp UE lift 4#  x 10 - note improved stability with task    UE cable column : x10 reps each  Shoulder protraction in sl overhead reach position, 10 #  Rows  L /R 15#    20 Reps:  Pec press machine w/o trunk support used, 30 #  Row machine w/o trunk support used,  30#    -leg press -      R & LLE 50# x 15 each 15'   Neuromuscular Re-education: 97112 Step ups 12 x5, then while holding  25 # load in R or L hand, stepping up & over with L or R LE    Walk ag waist belt: sidestepping 15# x10 each   No loss of control    15'    Pt response to tx today:   Pt reports no difference in level of difficulty with UE wt resisted     Total Treatment Time:  98' tx  Pt 15' late     Subj:    Pain:  /10 Location:    Additional Information: 08/24/15: patient required minimal cues for body mechanics   Pt discusses hx of pinched nerve in neck with intermittent RUE N&T & prior TIA's (seen on recent MRI).    Patient Education:   Demonstrated for pt return to community gym ex:  Row machine & chest press w focus on maint upright spinal posture & core stab  Leg press (see above)   HEP -Quadruped opp arm/leg  lifts, tall kneel & 1/2 kneel balance tasks w arm lifts/reaches:  POE & partial press up stretches  Sustained bridging w marching  Supine core stab w slow bicycle    Initial Summary:  Patient presents with?? RLE weakness & chronic LBP/neck pain contributing to functional limitations including inability to perform job duties & limited ambul .      Short Term Goals:?? ?? Initial status 08/11/15?? ??09/13/15   1.?? Within 3-5  visits Patient will perform?? initial home exercise program with minimal verbal cues, to address functional deficits & continue with progress after discharge?? Needs structured home exercise program to address deficits ?? ??HEP issued  ? Compliance as unable to demonstrate yet w/o mod cues   ????  longTerm Goals: time frame 3wks?? Status?? 08/11/15?? ??09/13/15  GOALS MET   1. Improve RLE strengths to 5/5 and demonstrates good core support during lifting & job simulation tasks.  Able to lift 25# ankle to chest safely, with good body mechanics,  Leg press w RLE to 45#  ?? Core & RLE weakness (3/5 to 4+/5) and cramping which limits?? ability to do job duties & results in poor posture & contributes to chronic spinal pain.  Leg Press: L 50#, R 30#?? ??MMT LE's wnl except: B glut max 5-/5: ??and RLE psoas 4-+5 , ??   gastroc:  4/5 R,  5-/5 L  Leg Press: BLE 50#  Lifting:     Ankles to waist  25#  Waist to head level 18#       Carrying 20# for 100' x 2 ,   and then  Up/down 5 steps        Also carried 10# up/down 3 steps on step ladder  & 25# up/down 12 step indep.   2. indep ambul on all surfaces w/o AD?? Currently ambul with std cane.?? ?? gait outside,10 min 08/30/2015 - ambul on level & inclined grassy surfaces, multiple directions with no decr speed, instab or difficulty     Can walk on TM @ 2.49mph

## 2015-09-20 ENCOUNTER — Encounter: Admit: 2015-09-20 | Discharge: 2015-09-20 | Payer: MEDICAID

## 2015-09-20 DIAGNOSIS — I69319 Unspecified symptoms and signs involving cognitive functions following cerebral infarction: Secondary | ICD-10-CM

## 2015-09-20 NOTE — Unmapped (Signed)
Name:[name] DOB:March 03, 1955 YNW:[id number]  Referring Provider: Dr. Orlin Hilding  Primary Diagnosis: CVA  Onset Date: 07/28/15  Diagnosis:  1. 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       Not addressed 1.Cancellation task (k,u,y): 28/31=90%  2. ID errors on lunch menu: 19/28-68% 1. Circle all hot items and single cross out all animals: 19/30=63%    Patient will complete short term memory task with 90%. Not addressed 1. Verbal: 2 step/4 component directions: 29/34=85% 1. Visual: recall of 5 shapes & placement in box: 22/40=55%.  2. Verbal: 5 words via category exclusion: 11/14=79%    Patient will complete basic executive function/ reasoning/planning task with 90%. Not addressed Schedule planning given specified criteria: 8/9=89%.  New HW: ID picture described by inferential reasoning x6 pictures HW: stated he completed but left at home. Will bring to Friday's session. New HW: 1. ID all errors on calendar  2. [employer] task.    Patient will complete the RBMT-3 to further assess short term memory strengths/weaknesses. Not addressed Not addressed Not addressed    Patient/caregivers will demonstrate understanding of role of speech therapy diagnosis, prognosis, home exercise programs, plan of care, recommendations, and safety precautions. Not addressed See below See below      ________________________________________________________________________  Visit #: 4      Education Addressed During This Session: To new HW and to bring prior HW back.      This note serves as a discharge summary if the patient does not return to Speech Therapy per the above  POC.    Lorel Monaco MACCCSLP [phone]    Lafe Garin Graduate Student Clinician

## 2015-09-22 ENCOUNTER — Encounter: Admit: 2015-09-22 | Discharge: 2015-09-22 | Payer: MEDICAID

## 2015-09-22 DIAGNOSIS — I69319 Unspecified symptoms and signs involving cognitive functions following cerebral infarction: Secondary | ICD-10-CM

## 2015-09-22 NOTE — Unmapped (Signed)
Name:[name] DOB:Apr 05, 1955 ZOX:[id number]  Referring Provider: Dr. Orlin Hilding  Primary Diagnosis: CVA  Onset Date: 07/28/15  Diagnosis:  1. 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       Not addressed 1.Cancellation task (k,u,y): 28/31=90%  2. ID errors on lunch menu: 19/28-68% 1. Circle all hot items and single cross out all animals: 19/30=63% Not addressed in session. HW: cancellation task (cancel out bolded 'fat' and bolded 'skinny' words and trail making task #1.   Patient will complete short term memory task with 90%. Not addressed 1. Verbal: 2 step/4 component directions: 29/34=85% 1. Visual: recall of 5 shapes & placement in box: 22/40=55%.  2. Verbal: 5 words via category exclusion: 11/14=79% Verbal: 2 step/4 component directions with shapes: 27/29=93%, recalling appointment times: pt was unable to recall correct information when 2 appointment days and times were given at once, when task was broken up into two separate appointments, pt was able to recall first appointment day and time with 100% accuracy (5/5) and second appointment day and time with 80% accuracy (4/5). Visual: recalling names of people 2/5=40% with incorrect spelling for 4 names and 1 not attempted.    Patient will complete basic executive function/ reasoning/planning task with 90%. Not addressed Schedule planning given specified criteria: 8/9=89%.  New HW: ID picture described by inferential reasoning x6 pictures HW: stated he completed but left at home. Will bring to Friday's session. New HW: 1. ID all errors on calendar  2. [employer] task. HW: Pt returned with  all homework. Pt was 100% accurate ID-ing picture described by inferential reasoning 6/6 pics. Pt was 12/12=100% accurate determining the placement of objects on a shelf with a given description. Pt was 11/14=79% ID-ing all errors on a calendar increasing to 100% when attention was called to errors. New HW given: deduction puzzle #1.   Patient will complete the RBMT-3 to further assess short term memory strengths/weaknesses. Not addressed Not addressed Not addressed Not addressed   Patient/caregivers will demonstrate understanding of role of speech therapy diagnosis, prognosis, home exercise programs, plan of care, recommendations, and safety precautions. Not addressed See below See below See below     ________________________________________________________________________  Visit #: 5      Education Addressed During This Session: Pt (unaccompanied) was educated to new HW. Pt stated that his neurologist had said that pt's MRI revealed that he had 2-3 strokes before the most recent stroke, but pt stated he would have remembered this. Pt was educated to the fact that this can, indeed, happen. Pt was receptive to this, but then stated that he is contacting a lawyer later today in order to seek legal action against his work, stating that his hand injury at work was related to his most recent CVA.      This note serves as a discharge summary if the patient does not return to Speech Therapy per the above POC.    Bennie Dallas Graduate Clinician    Lorel Monaco MACCCSLP [phone]

## 2015-09-25 ENCOUNTER — Encounter

## 2015-09-25 NOTE — Unmapped (Signed)
Patient was a no call, no show for his 11:45am speech visit today. This is his first no show.    Lorel Monaco MACCCSLP [phone]

## 2015-09-27 ENCOUNTER — Encounter: Admit: 2015-09-27 | Discharge: 2015-09-27 | Payer: MEDICAID

## 2015-09-27 DIAGNOSIS — I69319 Unspecified symptoms and signs involving cognitive functions following cerebral infarction: Secondary | ICD-10-CM

## 2015-09-27 DIAGNOSIS — Z7409 Other reduced mobility: Secondary | ICD-10-CM

## 2015-09-27 NOTE — Unmapped (Signed)
Occupational Therapy Daily Treatment Note  Name: [name]     DOB: 02/22/1955  MRN: [id number]     Referring Physician:  Ellwood Sayers, MD  [address]  [place], [place] [address]  Phone: [phone]  Fax:      Primary Diagnosis: CVA    Precautions: n/a    Total Visits: 9  Progress Note Due (every 30 days from first tx session after initial eval): 09/09/15  Insurance: Payor: [employer] COMMUNITY HEALTH / Plan: [facility] COMMUNITY HEALTH / Product Type: Medicaid Mngd care /   07.10.17/AU/[id number]/BENEFIT AUTH INFO  PT,OT,ST- Restaurant manager, fast food COMMUNITY HEALTH MEDICAID [place]  EFF DATE: July   IN NETWORK   CO-INS: 100%  NO YRLY /LTM/ PRE-EXISTING   PT GETS 30 V EACH PT/OT/ST PER CAL YR, USED 0  THEN PRECERT IS REQUIRED EVEN FOR EVALS    Subjective:   Objective: Pain:  Pre-treatment:  8/10  Location: Reports back pain;  Reports 6/10 pain in his left small finger, describes tingling and numbness. Patient does not mention pain in finger or back until asked.        therapeutic activity Patient INDEP with BADL's. INDEP IADL's.      Patient has returned to driving, manages his own money, manages his own medications, appointments. Cognitively he is at baseline for memory and executive functioning.     RIGHT GRIP: 110; LEFT GRIP: 103  MMT: RUE: 5/5 shoulder flexion , 5/5 elbow flexion; LUE: 5/5 shoulder flexion; 5/5 elbow flexion    9 HOLE PEG TEST: RIGHT: 19 seconds;    LEFT: 19 seconds         Education provided (to patient staff, caregivers. Specify person, place, type of training provided): perceptual/cognitive retraining, therapeutic activity and patient/family education      Assessment: [name]  Has returned to prior level of functioning. He is INDEP with transfers, functional mobility.INDEP all ADL's, homemaking, medications and driving. His BUE strength is WNL and grip WNL. Patient  annoyed this session stating his insurance will expire this week and he is wanting his finger injury linked to the CAUSE of his  stroke. States he wants to appeal this and wants to be paid while off work. Whe asked why he cannot return to work,   ( other than being cleared by MD)  He states that he has NO LIMITATION for returning to work. Cognitively, patient has returned to baseline function.plan DISCHARGE after next visit.        Therapy Goals:  ????  Short Term Goals to be met within 2 visits:?? 8/30   1. Patient will demonstrate independence with progressive HEP for RUE strengthening to address functional deficits with continued progress after discharge.?? ?? MET: independent with progressive HEP for RUE strengthening      ????????????????????????  Long Term Goals to be met within 4 weeks: ?? 8/30   1.Patient will demonstrate INDEP in home management (meals, laundry, shopping) with good safety, compensatory strategies for memory.?? MET: INDEP in home management (meals, laundry, shopping) with good safety   2.Increase strength (MMT 4/5 to 5/5, grip: increase by 10#)?? MET:MMT 5/5; Grip: right 110#, left:103#   3.Assess cognitive skills next session via MOCA, SBT, SDMT,TRAILS. Patient will INDEP verbalize understanding of cognitive compensatory strategies to assist with memory deficits for functional activities.  ?? MET: INDEP cognitive skills for functional activity, medication management, appointments.    4.Assess visual perception skills via MVPT, dynavision and keystone to make appropriate recommendations for vision and community reentry.??  MET: Vision, visual perception WFL.   5.Patient will be knowledgeable of community based/wellness program.???????????????????????????????????????????? Progress: TBA                  Services Provided/Billing:   CPT Qty Min Procedure   CPT Qty Procedure   Evaluation/ReEvaluation   Application of Modalities Untimed Document type/mins   [id number]   OT Evaluation - low complexity  [id number]  Unattended E Stim / IFC / HVPC / MFAC   97166   OT Evaluation - moderate complexity  97010  Cold Pack (document pre/post tx)   97167     OT Evaluation- high  complexity   97010   Hot Pack (document pre/post tx)   97168     OT Re-Evaluation   97018   Paraffin (document pre/post tx)   Therapeutic Procedures Each 15 Minutes   97022   Whirlpool (document mins)   97110   Therapeutic Exercises   97150   Group Therapy (document mins)   97112     Neuromuscular Re-ed         97760     Orthotic Training/Fitting         97530 3 43 Therapeutic Activity   Other   97532     Development of Cog Skills           97035     Ultrasound           97032   E-stim; attended       97535     Self Care           97140   Manual Therapy       97542     Wheelchair Management                 Treatment Diagnosis:   1. Impaired mobility and activities of daily living     2. Right arm weakness         Timed Code Treatment Minutes:  Total Treatment Time:  Total Number of Billed CPT Codes: 3       Therapist Signature: Tressia Miners, OTR/L   Date of Treatment: 09/27/2015

## 2015-09-27 NOTE — Unmapped (Signed)
Pt arrived 20 minutes early for his 10:15am ST appointment stating he was unable to remain in the building for his appointment because he needed to leave immediately after his just completed OT session in order to take care of some paperwork required for him to return to work.  Pt brought previously assigned homework to turn in and stated he wanted to see this SLP face to face to inform her of his need to leave prior to his ST appointment.    No charge today.    Lorel Monaco MACCCSLP [phone]

## 2015-10-04 ENCOUNTER — Encounter

## 2015-10-04 NOTE — Unmapped (Signed)
Patient was a no show for today's scheduled OT appointment.?? This is the patient's first no show or late cancellation for OT.?? This was scheduled as patients last discharge visit. No additional OT necessary. DC OT. 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   Therapist signature:  Arlana Hove OTR/L

## 2015-10-04 NOTE — Unmapped (Signed)
Patient called at 9:30am this date to cancel his 11am speech therapy appointment, stating he was at [employer] and would not be finished in time for today's appointment.  In the last week, pt has had 1 no show and 2 same day cancellations.  If patient misses/cancels another ST visit, he may be d/c'd per the attendance policy.    Lorel Monaco MACCCSLP [phone]

## 2015-10-06 ENCOUNTER — Encounter: Admit: 2015-10-06 | Discharge: 2015-10-06 | Payer: MEDICAID

## 2015-10-06 DIAGNOSIS — I6350 Cerebral infarction due to unspecified occlusion or stenosis of unspecified cerebral artery: Secondary | ICD-10-CM

## 2015-10-06 NOTE — Unmapped (Signed)
Name:[name] DOB:1955/04/01 ZOX:[id number]  Referring Provider: Dr. Orlin Hilding  Primary Diagnosis: CVA  Onset Date: 07/28/15  Diagnosis:  No diagnosis found.  Insurance Plan:Payor: [employer] COMMUNITY HEALTH / Plan: [facility] HEALTH / Product Type: Medicaid Mngd care /   # visits per insurance authorization: 30 v pcy  # visits per POC: 8  Date of initial eval: 09/06/15  Date of last POC: 10/06/15      Goals:  Goal 09/11/15 09/13/15 09/20/15 09/22/15 10/06/15   Patient will complete basic attention task with 90%.    10/06/15: Limited number of tasks d/t patient's multiple cancellations but completed with ave of 73%. Continue goal.       Not addressed 1.Cancellation task (k,u,y): 28/31=90%  2. ID errors on lunch menu: 19/28-68% 1. Circle all hot items and single cross out all animals: 19/30=63% Not addressed in session. HW: cancellation task (cancel out bolded 'fat' and bolded 'skinny' words and trail making task #1. Reviewed monthly update.  HW: 1.Divided attention trial #1: 9/20=45%  2.  Cancel out bolded 'fat' and bolded 'skinny' words:100%    Patient will complete short term memory task with 90%.    10/06/15: Limited number of tasks d/t patient's multiple cancellations but verbal memory tasks completed with ave of 77% and visual memory tasks with ave of 65%. Continue goal. Not addressed 1. Verbal: 2 step/4 component directions: 29/34=85% 1. Visual: recall of 5 shapes & placement in box: 22/40=55%.  2. Verbal: 5 words via category exclusion: 11/14=79% Verbal: 2 step/4 component directions with shapes: 27/29=93%, recalling appointment times: pt was unable to recall correct information when 2 appointment days and times were given at once, when task was broken up into two separate appointments, pt was able to recall first appointment day and time with 100% accuracy (5/5) and second appointment day and time with 80% accuracy (4/5). Visual: recalling names of people 2/5=40% with incorrect spelling for 4 names and 1 not  attempted.  Reviewed monthly update    1. Verbal: 2 step/ 4 component directions with shapes: 20/20= 100% accuracy   2. Visual: Following written direction by drawing shapes  16/16= 100% accuracy    Patient will complete basic executive function/ reasoning/planning task with 90%.    10/06/15: Limited number of tasks d/t patient's multiple cancellations but completed with ave of 85%. Continue goal. Not addressed Schedule planning given specified criteria: 8/9=89%.  New HW: ID picture described by inferential reasoning x6 pictures HW: stated he completed but left at home. Will bring to Friday's session. New HW: 1. ID all errors on calendar  2. [employer] task. HW: Pt returned with all homework. Pt was 100% accurate ID-ing picture described by inferential reasoning 6/6 pics. Pt was 12/12=100% accurate determining the placement of objects on a shelf with a given description. Pt was 11/14=79% ID-ing all errors on a calendar increasing to 100% when attention was called to errors. New HW given: deduction puzzle #1. Reviewed monthly update.  HW: [employer] Deduction puzzle #1:7/12=58%   Patient will complete the RBMT-3 to further assess short term memory strengths/weaknesses. Not addressed Not addressed Not addressed Not addressed Not addressed   Patient/caregivers will demonstrate understanding of role of speech therapy diagnosis, prognosis, home exercise programs, plan of care, recommendations, and safety precautions. Not addressed See below See below See below See below     ________________________________________________________________________  Visit #: 6      Education Addressed During This Session: Pt (unaccompanied) was educated to monthly update and SLP's recommendation  to add additional visits (2 x week for 2 weeks then 1 x week for 2 weeks).  However, immediately upon completion of this education, patient stated his insurance termed on Sept. 1, 2017 and he currently has no payor source.  SLP contacted insurance  verification team member who verified patient's report as correct. Insurance verification team member advised SLP to end today's session and provide patient with FAA application and encourage pt to complete FAA application before he left today.  Pt advised per these instructions and was left at the front desk completing the FAA application.  Pt advised by SLP he would be placed on a 1 month hold for either approval of FAA application or re-starting of his [facility] coverage.  Pt advised to contact SLP (provided business card) when either of these occurs and pt will be added back onto the schedule per above plan.  Pt instead understanding and agreement.    This note serves as a discharge summary if the patient does not return to Speech Therapy per the above POC.    Potential Benefits of Treatment: N/A: pt placed on 1 month hold to acquire payor source.    Barriers to Progress: No payor source as of Sept. 1, 2017    Pain Reported and Staff Response: None reported    Informed Consent: Patient has been educated regarding treatment risks/benefits and agrees with plan of care.    Rehab Potential:  N/A: pt placed on 1 month hold to acquire payor source.    Medications reviewed in Epic on 10/06/2015         Plan:  Frequency: Pt reported (and insurance verification team confirmed) that he has no payor source as of 09/29/15. Pt placed on a 1 month hold to acquire a payor source (either re-starting his [facility] or having an approved FAA application). If patient does not have a payor source as of 11/05/15, pt will be d/c'd from speech tx and may return in the future with a new MD order once he acquires a payor source.    Physician Certification:  I certify that the above patient is under my care and requires the above services.  These professional services are to be provided from an established plan, related to the diagnosis and reviewed by me every 10th treatment visit.    Physician name  (printed):    Physician Signature:    Date:    Lafe Garin Graduate Student Clinician    Lorel Monaco MACCCSLP [phone]

## 2015-10-09 ENCOUNTER — Encounter

## 2015-10-11 ENCOUNTER — Encounter

## 2015-10-18 ENCOUNTER — Encounter

## 2015-10-23 ENCOUNTER — Encounter

## 2015-10-24 ENCOUNTER — Encounter: Attending: Otolaryngology | Primary: Family Medicine

## 2015-11-03 NOTE — Telephone Encounter (Signed)
Hilda Lias calling to get clearance for pt surgery Monday, pt is getting nail bed amblation of Lt small finger with local anesthesia pls advise

## 2015-11-06 NOTE — Telephone Encounter (Signed)
Should be fine to do.

## 2015-11-06 NOTE — Telephone Encounter (Signed)
Pt had surgery

## 2015-11-06 NOTE — Unmapped (Addendum)
OPERATIVE NOTE Kallie Edward, M.D.      Patient: [name] MRN: [id number] Date of Birth: Jun 13, 1955  Age: 60 year old  Sex: male Unit: [facility] OR Room/Bed: HOR/ORPB Location: [facility]      Date of Procedure: 11/06/2015      Preoperative Diagnosis: Open fracture of tuft of distal phalanx of finger [S62.639B]      Postoperative Diagnosis: Same      Procedure: Procedure(s): NAIL BED ABLATION LEFT SMALL FINGER      Post-Op Diagnosis Codes:    * Open fracture of tuft of distal phalanx of finger [S62.639B] Pre-Op Diagnosis Codes:    * Open fracture of tuft of distal phalanx of finger [S62.639B]      Surgeon(s) and Role:    Kallie Edward., MD - Primary      * No surgical staff found *      Anesthesia: Local Estimated Blood Loss: less than 50 mL * No specimens in log *      Findings: Incomplete left small finger nailbed ablation      Complications: None      Implants: None      Operative Indications: Patient is a 60 year old male who sustained a crush injury to his left small finger while at work on 07/25/15. His nailbed was ablated as part of his initial repair at an OSH ED, and he subsequently has had parts of his nail   regrow. Risks and benefits of surgery for nailbed ablation were discussed, all questions were answered, and patient wished to proceed. Informed written consent was obtained.      Procedure:  The patient and operative site were identified in the preoperative holding area and were confirmed by the nursing staff as well as the patient. The operative site was marked using an indelible pen.      The patient was then taken to the operating room and placed supine on the operating table.  The left upper extremity was then prepped and draped in routine sterile fashion.      A formal timeout was taken again confirming the proper patient, intended procedure, operative site, and correct side.      Details of procedure: A finger tourniquet was applied to the left small finger. A  15-blade was to raise a flap of skin at the proximal nail fold.  The nail was removed.  The germinal matrix was identified and removed.  Care was taken to avoid injury to   the terminal tendon.  The wound was irrigated with normal saline and closed with 5-0 chromic.  The finger tourniquet was removed, and the finger was noted to be well perfused.  A dressing of xeroform, gauze, and an ACE wrap was applied.  The patient was   taken to the recovery room in stable condition.      Follow up arrangements:  [name] has been given a followup appointment and has been instructed in the importance of keeping this appointment.  If questions, concerns or any other issues arise, instructions have been given to call the office   immediately.  He will leave the dressing in place for 3 days, and then he can remove it and replace it with a bandaid.      Dr. Venetia Maxon was present for all of the procedure.      Signed By: Alcide Evener, MDM.D. 11/06/2015 11:39 AM      I was present and scrubbed for  the critical and key portions of the surgical procedure and I was immediately available to provide assistance. Kallie Edward, MD                     _________________________________  Signed by:   <ORC.11.3>Juno Bozard</ORC.11.3><ORC.11.3>MELISSA</ORC.11.3> <ORC.11.4>J.</ORC.11.4><ORC.11.4/> <ORC.11.2>Julyanna Scholle</ORC.11.2><ORC.11.2>SUMMERS</ORC.11.2> <ORC.11.7/><ORC.11.7/>  Kallie Edward     ST    D: 11/06/2015 11:39 AM  T: 11/06/2015 11:39 AM    This document is confidential medical information.  Unauthorized disclosure or use of this information is prohibited by law.  If you are not the intended recipient of this document, please advise [place] by calling immediately [phone].

## 2015-11-08 NOTE — Telephone Encounter (Addendum)
Stroke follow up phone call made. Unable to reach patient at home. Able to reach sister who states patient is doing fairly well. Is independent with ADLs and ambulation. Has girlfriend who stays with him who helps with grocery shopping and driving.   Sister was unsure what medication he was taking. States he does smoke less around her, but she wants him to quit altogether.  FAST and 911 reviewed.   Left message on patient's phone to call me.  MRS 1

## 2015-11-08 NOTE — Progress Notes (Signed)
Name:[name] DOB:1955-12-24 WNU:[id number]  Referring Provider: Dr. Orlin Hilding  Primary Diagnosis: CVA  Onset Date: 07/28/15  Diagnosis:  No diagnosis found.  Insurance Plan:Payor: [employer] / Plan: [facility] / Product Type: Medicaid Mng[place] care /   # visits per insurance authorization: 30 v pcy  # visits per POC: 8  Date of initial eval: 09/06/15  Date of last POC: Discharge 11/08/15      Goals:  Goal 09/11/15 09/13/15 09/20/15 09/22/15 10/06/15   Patient will complete basic attention task with 90%.    10/06/15: Limited number of tasks d/t patient's multiple cancellations but completed with ave of 73%. Continue goal.       Not addressed 1.Cancellation task (k,u,y): 28/31=90%  2. ID errors on lunch menu: 19/28-68% 1. Circle all hot items and single cross out all animals: 19/30=63% Not addressed in session. HW: cancellation task (cancel out bolded 'fat' and bolded 'skinny' words and trail making task #1. Reviewed monthly update.  HW: 1.Divided attention trial #1: 9/20=45%  2.  Cancel out bolded 'fat' and bolded 'skinny' words:100%    Patient will complete short term memory task with 90%.    10/06/15: Limited number of tasks d/t patient's multiple cancellations but verbal memory tasks completed with ave of 77% and visual memory tasks with ave of 65%. Continue goal. Not addressed 1. Verbal: 2 step/4 component directions: 29/34=85% 1. Visual: recall of 5 shapes & placement in box: 22/40=55%.  2. Verbal: 5 words via category exclusion: 11/14=79% Verbal: 2 step/4 component directions with shapes: 27/29=93%, recalling appointment times: pt was unable to recall correct information when 2 appointment days and times were given at once, when task was broken up into two separate appointments, pt was able to recall first appointment day and time with 100% accuracy (5/5) and second appointment day and time with 80% accuracy (4/5). Visual: recalling names of people 2/5=40% with incorrect spelling for 4 names  and 1 not attempted.  Reviewed monthly update    1. Verbal: 2 step/ 4 component directions with shapes: 20/20= 100% accuracy   2. Visual: Following written direction by drawing shapes  16/16= 100% accuracy    Patient will complete basic executive function/ reasoning/planning task with 90%.    10/06/15: Limited number of tasks d/t patient's multiple cancellations but completed with ave of 85%. Continue goal. Not addressed Schedule planning given specified criteria: 8/9=89%.  New HW: ID picture described by inferential reasoning x6 pictures HW: stated he completed but left at home. Will bring to Friday's session. New HW: 1. ID all errors on calendar  2. [employer] task. HW: Pt returned with all homework. Pt was 100% accurate ID-ing picture described by inferential reasoning 6/6 pics. Pt was 12/12=100% accurate determining the placement of objects on a shelf with a given description. Pt was 11/14=79% ID-ing all errors on a calendar increasing to 100% when attention was called to errors. New HW given: deduction puzzle #1. Reviewed monthly update.  HW: [employer] Deduction puzzle #1:7/12=58%   Patient will complete the RBMT-3 to further assess short term memory strengths/weaknesses. Not addressed Not addressed Not addressed Not addressed Not addressed   Patient/caregivers will demonstrate understanding of role of speech therapy diagnosis, prognosis, home exercise programs, plan of care, recommendations, and safety precautions. Not addressed See below See below See below See below     ________________________________________________________________________  Visit #: 6      Education Addressed During This Session: Pt (unaccompanied) was educated to monthly update and SLP's  recommendation to add additional visits (2 x week for 2 weeks then 1 x week for 2 weeks).  However, immediately upon completion of this education, patient stated his insurance termed on Sept. 1, 2017 and he currently has no payor source.  SLP contacted  insurance verification team member who verified patient's report as correct. Insurance verification team member advised SLP to end today's session and provide patient with FAA application and encourage pt to complete FAA application before he left today.  Pt advised per these instructions and was left at the front desk completing the FAA application.  Pt advised by SLP he would be placed on a 1 month hold for either approval of FAA application or re-starting of his [facility] coverage.  Pt advised to contact SLP (provided business card) when either of these occurs and pt will be added back onto the schedule per above plan.  Pt instead understanding and agreement.    UPDATE 11/08/15: Pt has not contacted SLP.  SLP contacted financial assistance who reported pt did submit an FAA application 2 weeks ago but did not provide supporting documentation showing 0 income.  Patient has not responded to their requests to provide this documentation.  Pt d/c'd this date after being on hold since 10/06/15 as he has not yet followed through with completion of his FAA paperwork.  Patient may return to speech with a new MD order once he has a payor source.    This note serves as a discharge summary if the patient does not return to Speech Therapy per the above POC.    Potential Benefits of Treatment: N/A: pt placed on 1 month hold to acquire payor source.    Barriers to Progress: No payor source as of Sept. 1, 2017    Pain Reported and Staff Response: None reported    Informed Consent: Patient has been educated regarding treatment risks/benefits and agrees with plan of care.    Rehab Potential:  N/A: pt placed on 1 month hold to acquire payor source.    Medications reviewed in Epic on 11/08/2015         Plan:  Frequency: Pt reported (and insurance verification team confirmed) that he has no payor source as of 09/29/15. Pt placed on a 1 month hold to acquire a payor source (either re-starting his [facility] or  having an approved FAA application). If patient does not have a payor source as of 11/05/15, pt will be d/c'd from speech tx and may return in the future with a new MD order once he acquires a payor source. 11/08/15: patient does not yet have a payor source-has not followed through on providing requested documentation for FAA application showing 0 income.  Patient d/c from speech therapy this date.    Physician Certification:  I certify that the above patient is under my care and requires the above services.  These professional services are to be provided from an established plan, related to the diagnosis and reviewed by me every 10th treatment visit.    Physician name (printed):    Physician Signature:    Date:    Lafe Garin Graduate Student Clinician    Lorel Monaco MACCCSLP [phone]

## 2015-11-16 ENCOUNTER — Ambulatory Visit: Admit: 2015-11-16 | Payer: PRIVATE HEALTH INSURANCE | Attending: Neurology

## 2015-11-16 DIAGNOSIS — I6350 Cerebral infarction due to unspecified occlusion or stenosis of unspecified cerebral artery: Secondary | ICD-10-CM

## 2015-11-16 NOTE — Progress Notes (Signed)
Subjective:      Patient ID: [name] is a 60 y.o. male.    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  He does 15 minutes on bike and 15 treadmill 5 days a week.  He also does some weight work.      He just had nail ablation of L 5th finger.         Histories:     He has a past medical history of COPD (chronic obstructive pulmonary disease) (HCC); Diabetes mellitus (HCC); Dysphagia; GERD (gastroesophageal reflux disease); Hepatitis; and Hypertension.    He has a past surgical history that includes Foot surgery (Left); Esophagogastroduodenoscopy (N/A, 11/19/2013); and Hernia repair.    His family history is not on file.    He reports that he has been smoking Cigarettes.  He has a 1.25 pack-year smoking history. He has never used smokeless tobacco. He reports that he does not drink alcohol or use drugs.      Review of Systems  As per HPI  Allergies:   Penicillins    Medications:     Outpatient Encounter Prescriptions as of 11/16/2015   Medication Sig Dispense Refill    ALBUTEROL INHL Inhale into the lungs.      amLODIPine (NORVASC) 10 MG tablet Take 10 mg by mouth daily      aspirin 81 MG EC tablet Take 1 tablet (81 mg total) by mouth daily. 30 tablet 0    atorvastatin (LIPITOR) 80 MG tablet Take 1 tablet (80 mg total) by mouth at bedtime. 30 tablet 0    blood sugar diagnostic Strp Test daily      blood-glucose meter Misc by Does not apply route.      buPROPion SR (WELLBUTRIN SR) 100 MG tablet Take 100 mg by mouth 2 times daily Ran out of prescription      entecavir (BARACLUDE) 0.5 MG tablet Take 1 tablet (0.5 mg total) by mouth daily. 30 tablet 2    FLUoxetine (PROZAC) 20 MG capsule       hydroCHLOROthiazide (MICROZIDE) 12.5 mg capsule Take 1 capsule (12.5 mg total) by mouth daily. 30 capsule 3    ibuprofen (ADVIL,MOTRIN) 800 MG tablet Take 0.5 tablets (400 mg total) by mouth every 6 hours as needed for up to 16 doses. 16 tablet 0    ketoconazole (NIZORAL) 2 % cream        loratadine 10 mg Cap Take 1 tablet by mouth daily      losartan (COZAAR) 100 MG tablet Take 1 tablet (100 mg total) by mouth daily. Take 100 mg by mouth daily 30 tablet 0    metFORMIN (GLUCOPHAGE) 500 MG tablet Take 500 mg by mouth 2 times a day with meals.      ondansetron (ZOFRAN ODT) 4 MG disintegrating tablet Take 1 tablet (4 mg total) by mouth every 6 hours as needed for Nausea. 16 tablet 0    orphenadrine (NORFLEX) 100 mg tablet Take 1 tablet (100 mg total) by mouth 2 times a day. 10 tablet 0    oxyCODONE (ROXICODONE) 5 MG immediate release tablet Take 1 tablet (5 mg total) by mouth every 4 hours as needed for Pain. 30 tablet 0    oxyCODONE-acetaminophen (PERCOCET) 10-325 mg per tablet 1 tablet every 4 hours as needed.         tamsulosin (FLOMAX) 0.4 mg Cp24 Take 0.4 mg by mouth daily      traZODone (DESYREL) 100  MG tablet        No facility-administered encounter medications on file as of 11/16/2015.         Objective:       Blood pressure 146/85, pulse 68, weight 172 lb (78 kg).    Neurologic Exam     Mental Status   Oriented to person, place, and time.   Normal language     Cranial Nerves     CN II   Visual fields full to confrontation.     CN VII   Facial expression full, symmetric.     CN XII   CN XII normal.     Motor Exam     Strength   Strength 5/5 throughout.     Gait, Coordination, and Reflexes     Gait  Gait: normal    Coordination   Tandem walking coordination: normalRAMS normal       Physical Exam   Nursing note and vitals reviewed.  Constitutional: He is oriented to person, place, and time. He appears well-developed and well-nourished.   Neck:   No bruit   Cardiovascular: Normal rate and regular rhythm.    Neurological: He is oriented to person, place, and time. He has normal strength. He has a normal Tandem Gait Test. Gait normal.       Prior Diagnostic Testing:  Past neuroradiology studies  MRI head results:  Mri Head Limited Wo Contrast    Result Date: 07/29/2015  IMPRESSION: Small acute  deep white matter infarct in the left posterior frontal lobe. Report Verified by: Patriciaann Clan, M.D. at 07/29/2015 11:14 AM EDT          Assessment:     1) Lacunar stroke  2) Diabetes  3) HTN  4) Smoker   5) Hyperlipidemia      Plan:     1) Smoking cessation - 5-6 cigarettes a day.  Working with primary physician.  2) Statin LDL < 70  3) BP < 140/90. Borderline right now.  4) Aspirin  5) Diabetic control - doing well by his report  6) Can go back to work.    For this patient with an established visit of 25 minutes, I spent more than 50% of this time discussing diagnosis, management and treatment plan.

## 2015-11-16 NOTE — Unmapped (Signed)
Have to stop smoking  Control BP <140/90 and <160/90.  Cholesterol control (LDL <70)  Aspirin  Diabetes control as per you are doing.  Can go back to work

## 2015-11-29 ENCOUNTER — Ambulatory Visit: Admit: 2015-11-29 | Discharge: 2015-11-29 | Payer: PRIVATE HEALTH INSURANCE | Attending: Pulmonary Disease

## 2015-11-29 ENCOUNTER — Inpatient Hospital Stay: Admit: 2015-11-29 | Payer: PRIVATE HEALTH INSURANCE | Attending: Pulmonary Disease

## 2015-11-29 DIAGNOSIS — J449 Chronic obstructive pulmonary disease, unspecified: Secondary | ICD-10-CM

## 2015-11-29 NOTE — Unmapped (Signed)
History of Present Illness:      [name] is a 60 y.o. male.    HPI    Patient was referred for evaluation of COPD  Exercises a lot, rides his bicycle  Smokes half a pack a day, used to smoke more  He has been using Symbicort since 2015  Used to see Dr [name]  Patient denies hemoptysis, increased shortness of breath, chest pains, headaches, nausea, vomiting, diarrhea  No fevers or chills. No new skin rashes  Tried to quit smoking but was unsuccessful  He has sleep apnea, but he does not use the CPAP  He has 2 dogs  No birds or cats      Review of Systems   Constitutional: Positive for activity change, appetite change and chills.   HENT: Positive for rhinorrhea and sneezing.    Respiratory: Positive for cough, shortness of breath and wheezing.    Cardiovascular: Positive for chest pain.   Genitourinary: Positive for urgency.   Musculoskeletal: Positive for back pain and neck pain.   Neurological: Positive for dizziness, weakness and numbness.   Psychiatric/Behavioral: Positive for decreased concentration, depression and sleep disturbance.   All other systems reviewed and are negative.      Objective:   Blood pressure 108/68, pulse 62, height 5' 11 (1.803 m), weight 171 lb 9.6 oz (77.8 kg), SpO2 97 %.    Physical Exam   Nursing note and vitals reviewed.  Constitutional: He is oriented to person, place, and time. He appears well-developed and well-nourished. No distress.   HENT:   Head: Normocephalic and atraumatic.   Eyes: Conjunctivae and EOM are normal. Right eye exhibits no discharge. Left eye exhibits no discharge. No scleral icterus.   Neck: Normal range of motion. Neck supple. No tracheal deviation present. No thyromegaly present.   Cardiovascular: Normal rate and regular rhythm.  Exam reveals no gallop and no friction rub.    No murmur heard.  Pulmonary/Chest: Effort normal and breath sounds normal. No respiratory distress. He has no wheezes.   Abdominal: Soft. Bowel sounds are normal. He exhibits no  distension. There is no tenderness.   Musculoskeletal: Normal range of motion. He exhibits no edema or deformity.   Neurological: He is alert and oriented to person, place, and time. He has normal reflexes. No cranial nerve deficit.   Skin: Skin is warm and dry. No rash noted. He is not diaphoretic. No erythema.   Psychiatric: He has a normal mood and affect. His behavior is normal. Judgment and thought content normal.         IMPRESSION:     No acute cardio pulmonary disease.    A 0 granulomas disease and mild centrilobular emphysema.    Hypodensities in the liver are too small to characterize  completely, most likely benign in the since of history of  malignancy.   Result Narrative   EXAMINATION:  CT OF THE CHEST WITHOUT CONTRAST  Aug 20, 2013 12:48:26 PM    TECHNIQUE:  CT of the chest was performed without the administration of  intravenous  contrast. Multiplanar reformatted images are provided for review.    COMPARISON:  None    HISTORY:  SOB (shortness of breath)    FINDINGS:  Mediastinum: No adenopathy. The heart and pericardium are  unremarkable.    Lungs/Pleura: Calcified granuloma is noted posteriorly in the  right upper lobe. Mild findings of centrilobular emphysema present  bilaterally.    Upper Abdomen: Scattered, punctate hypodensities in the liver  noted measuring up to 5 mm.    Soft Tissues/Bones: No acute osseous injury.????No soft tissue  abnormalities.         Assessment:     COPD/ active smoker    Plan:     Continue the symbicort 2 puffs twice a day  Provided spacer today  He has rescue inhaler at home, does not use it much  Check PFTs  Smoking cessation discussed with patient at length. Did not like the Nicotine patches  Flu shot recommended every year, he does not like getting flu shots

## 2015-11-30 NOTE — Telephone Encounter (Signed)
Placed call to pt per Dr. Damaris Hippo left voicemail  cxr looks good  Left call back number for questions

## 2015-12-12 DIAGNOSIS — R51 Headache: Secondary | ICD-10-CM

## 2015-12-12 NOTE — ED Provider Notes (Signed)
[place] ED Note    Date of Service: 12/13/2015      Reason for Visit: Headache and Hearing Loss      Patient History     HPI:  [name] is a 60 y.o. male with a significant history of Ischemic stroke left MCA on June 2017, COPD, diabetes, hypertension and hepatitis presents to the emergency department with left sided head pain and muffled hearing that started yesterday. Patient reports that yesterday he noticed the left side of his head was very sensitive, and states that he feels a pain when he touches his scalp.  He also reports mild decreased hearing of his left ear with mild discomfort when he moves his ear.  He also reports some mild eye discomfort when he looks to his left on his left eye.  He denies any focal weakness, numbness, tingling, changes in his mental status, changes in his gait, visual disturbances, headache, chest pain, shortness of breath, nausea, vomiting, diarrhea, fever, chills, neck pain or palpitations.      With the exception of above, the patient denies any aggravating or alleviating factors as well as any other associated signs or symptoms.    Past Medical History:   Diagnosis Date    COPD (chronic obstructive pulmonary disease) (HCC)     Diabetes mellitus (HCC)     Dysphagia     GERD (gastroesophageal reflux disease)     Hepatitis     Hypertension        Past Surgical History:   Procedure Laterality Date    ESOPHAGOGASTRODUODENOSCOPY N/A 11/19/2013    Procedure: ESOPHAGOGASTRODUODENOSCOPY WITH MAC;  Surgeon: Charlane Ferretti, MD;  Location: [facility] ENDOSCOPY;  Service: Gastroenterology;  Laterality: N/A;    FOOT SURGERY Left     HERNIA REPAIR      about 5 yrs ago       KUYPER COCKETT  reports that he has been smoking Cigarettes.  He has a 1.25 pack-year smoking history. He has never used smokeless tobacco. He reports that he does not drink alcohol or use drugs.    Previous Medications    ALBUTEROL INHL    Inhale into the lungs.    AMLODIPINE (NORVASC) 10  MG TABLET    Take 10 mg by mouth daily    ASPIRIN 81 MG EC TABLET    Take 1 tablet (81 mg total) by mouth daily.    ATORVASTATIN (LIPITOR) 80 MG TABLET    Take 1 tablet (80 mg total) by mouth at bedtime.    BLOOD SUGAR DIAGNOSTIC STRP    Test daily    BLOOD-GLUCOSE METER MISC    by Does not apply route.    BUPROPION SR (WELLBUTRIN SR) 100 MG TABLET    Take 100 mg by mouth 2 times daily Ran out of prescription    ENTECAVIR (BARACLUDE) 0.5 MG TABLET    Take 1 tablet (0.5 mg total) by mouth daily.    FLUOXETINE (PROZAC) 20 MG CAPSULE        HYDROCHLOROTHIAZIDE (MICROZIDE) 12.5 MG CAPSULE    Take 1 capsule (12.5 mg total) by mouth daily.    IBUPROFEN (ADVIL,MOTRIN) 800 MG TABLET    Take 0.5 tablets (400 mg total) by mouth every 6 hours as needed for up to 16 doses.    KETOCONAZOLE (NIZORAL) 2 % CREAM        LORATADINE 10 MG CAP    Take 1 tablet by mouth daily    LOSARTAN (COZAAR) 100  MG TABLET    Take 1 tablet (100 mg total) by mouth daily. Take 100 mg by mouth daily    METFORMIN (GLUCOPHAGE) 500 MG TABLET    Take 500 mg by mouth 2 times a day with meals.    ONDANSETRON (ZOFRAN ODT) 4 MG DISINTEGRATING TABLET    Take 1 tablet (4 mg total) by mouth every 6 hours as needed for Nausea.    ORPHENADRINE (NORFLEX) 100 MG TABLET    Take 1 tablet (100 mg total) by mouth 2 times a day.    OXYCODONE (ROXICODONE) 5 MG IMMEDIATE RELEASE TABLET    Take 1 tablet (5 mg total) by mouth every 4 hours as needed for Pain.    OXYCODONE-ACETAMINOPHEN (PERCOCET) 10-325 MG PER TABLET    1 tablet every 4 hours as needed.       TAMSULOSIN (FLOMAX) 0.4 MG CP24    Take 0.4 mg by mouth daily    TRAZODONE (DESYREL) 100 MG TABLET           Allergies:   Allergies as of 12/12/2015 - Fully Reviewed 12/12/2015   Allergen Reaction Noted    Penicillins Hives 04/22/2013       Nursing notes reviewed.    Review of Systems     Review of Systems   Constitutional: Negative for chills and fever.   HENT: Positive for ear pain and hearing loss.    Eyes: Negative  for visual disturbance.   Respiratory: Negative for shortness of breath.    Cardiovascular: Negative for chest pain.   Gastrointestinal: Negative for abdominal pain and vomiting.   Genitourinary: Negative for dysuria.   Musculoskeletal: Negative for back pain and neck pain.   Skin: Negative for rash.   Neurological: Negative for weakness.       Physical Exam     Vitals:    12/12/15 2258 12/13/15 0010 12/13/15 0224   BP: 147/87 124/87 102/67   BP Location: Right arm Right arm Right arm   Patient Position: Sitting Lying Lying   Pulse: 71 64 60   Resp: 16 10 16    Temp: 98.6 F (37 C) 97.7 F (36.5 C) 97.7 F (36.5 C)   TempSrc: Oral Oral Oral   SpO2: 98% 97% 95%       General:  Well appearing. No acute distress.    Head: Mild tenderness to palpation over the left parietal scalp.  No lesions, abrasions, lacerations or ecchymosis.    Eyes:  Pupils reactive. No discharge from eyes.     ENT:  No discharge from nose. OP clear. TM's intact bilaterally.     Neck:  Supple.    Pulmonary:   Non-labored breathing. Breath sounds clear bilaterally.    Cardiac:  Regular rate and rhythm. No murmurs.    Abdomen:  Soft. Non-tender. Non-distended.     Musculoskeletal:  No long bone deformity.  No CVA tenderness.    Vascular:  Extremities warm and perfused.    Skin:  No rash.    Neuro: The patient is alert and oriented to person, place, and time with normal speech. Memory is normal and thought process is intact. Cranial Nerves 2-12 intact: Visual fields normal in all quadrants. Pupils are round, reactive to light and accommodation. Extraocular movements are intact without ptosis. Facial sensation is intact to bilaterally. Facial muscle strength is normal and equal bilaterally. Hearing is normal bilaterally. Palate and uvula elevate symmetrically. Voice is normal. Shoulder shrug strong, and equal bilaterally. Tongue protrudes midline and moves  symmetrically. Good muscle tone. Strength is 5/5 bilaterally at the deltoid, biceps, triceps,  quadriceps, and hamstrings. No pronator drift. Sensation is intact bilaterally to pain and light touch. Two-point discrimination is intact. Biceps, brachioradialis, triceps, patellar, and Achilles reflexes are 2+ bilaterally. No clonus. Plantar reflex is downward bilaterally. Finger-to-nose and heel-to-shin test normal bilaterally. Romberg negative. Rapid alternating movements normal. Gait is steady with a normal base. Coordination is intact as measured by heel walk and toe walk.      Extremities:  No peripheral edema.      Diagnostic Studies     Labs:    Please see electronic medical record for any tests performed in the ED     Radiology:    Please see electronic medical record for any tests performed in the ED    EKG:    No EKG Performed    ED Course and MDM   CRISTOFER RATHGEBER is a 60 y.o. male who presented to the emergency department with scalp tenderness and decreased hearing with a history and presentation as described above in HPI. The patient was evaluated by myself, R4, Dr. Alois Cliche, and the ED Attending Physician, Dr. Lorenz Coaster. All management and disposition plans were discussed and agreed upon.    A full history and physical was performed, and appropriate labs and images were obtained. Chart and Nursing notes were reviewed. On presentation the patient was comfortable, in mild pain and in no acute distress, he was hemodynamically stable with normal vitals. Given the patient's history and physical exam, there is no concern for acute intracranial pathology such as hemorrhage, mass, stroke, TIA, meningitis or encephalitis.  Patient's neurological exam was completely normal.  I discussed with the patient the need to follow-up with his primary care physician regarding his scalp tenderness and decreased hearing of his left ear.  He reports that he has an appointment with his PCP on November 29.  I discussed strict return precautions with the patient including focal weakness, numbness, tingling, worsening symptoms,  concerning symptoms, changes in his mental status, headache, changes in his vision, worsening hearing loss or any new/concerning symptoms. Patient to follow-up with PMD for further evaluation and treatment of symptoms. Patient understands and agrees to this plan. Risks, benefits, and alternatives were discussed. At this time the patient has been deemed safe for discharge. My customary discharge instructions including strict return precautions for worsening or new symptoms have been communicated.    Impression     1. Scalp tenderness    2. Hearing decreased, left         Plan     1. The patient is to be discharged in stable condition.  2. Workup, treatment and diagnosis were discussed with the patient and/or family members; the patient agrees to the plan and all questions were addressed and answered.  3. The patient is instructed to return to the emergency department should his symptoms worsen or any concern he believes warrants acute physician evaluation.      Ellouise Newer, MD, PGY1  [facility]           [facility] Kyron Schlitt  Resident  12/13/15 [phone]

## 2015-12-12 NOTE — ED Triage Notes (Signed)
Pt states he had a stroke on the left side of his brain in June and states today he noticed hearing loss in the left ear and sensitivity to touch on the left side of his head.

## 2015-12-12 NOTE — ED Provider Notes (Signed)
ED Attending Attestation Note    Date of service:  12/13/2015    This patient was seen by the resident physician.  I have seen and examined the patient, agree with the workup, evaluation, management and diagnosis. The care plan has been discussed and I concur.     My assessment reveals a 60 y.o. male who presents for left sided scalp pain and left ear pain.  He reports that he noticed it upon awakening yesterday.  He reports no known inciting event.  He reports a history of previous stroke but reports no current symptoms of weakness.  He does report some mild decreased hearing in his left ear.  He reports no drainage.  On examination the patient is alert and oriented.  He has no erythema, edema or skin lesion of his scalp.  He had some cerumen in his left ear canal but his TM is clear.  He is neurologically intact.

## 2015-12-13 ENCOUNTER — Inpatient Hospital Stay
Admit: 2015-12-13 | Discharge: 2015-12-13 | Disposition: A | Discharge status: Home / Self Care | Payer: PRIVATE HEALTH INSURANCE

## 2015-12-13 NOTE — ED Notes (Signed)
Pt to ED bed B21.  Pt alert and oriented, complaining of L-sided head soreness which started 2 days ago, accompanied by hearing loss in L ear.  Pt has hx stroke in June.  Pt with equal strength in all extremeties, no facial droop/slurred speech/vision changes.

## 2015-12-13 NOTE — Unmapped (Signed)
You were seen in the Emergency Department for scalp tenderness and decreased hearing. Your symptoms and physical exam were not concerning for stroke.     Please follow up with your primary care physician.     Please return to the ED if your symptoms worsen or do not resolve; or if you develop any chest pain, shortness of breath, abdominal pain, uncontrollable vomiting, unable to eat, passing out, difficulty moving your arms or legs, difficulty speaking, fever (greater than 101 degrees), or any concern that you feel needs acute physician evaluation.

## 2015-12-13 NOTE — ED Notes (Signed)
Received order to discharge patient home. Pt verbalized understanding of discharge instructions. Ambulated out of CEC with steady gait.

## 2015-12-25 NOTE — Telephone Encounter (Signed)
Received fax from [employer] stating they do not have updates ins coverage for pt. They said they have tried to contact pt. I verified that [employer] is active. Called Salome Arnt @ [phone] and LVM stating ins info to her.

## 2015-12-28 ENCOUNTER — Inpatient Hospital Stay: Admit: 2015-12-28 | Payer: PRIVATE HEALTH INSURANCE | Attending: Pulmonary Disease

## 2015-12-28 DIAGNOSIS — J449 Chronic obstructive pulmonary disease, unspecified: Secondary | ICD-10-CM

## 2015-12-28 LAB — PFT13-PULMONARY FUNCTION TEST
DLCO%: 59
DLCO: 17.67
FEV1%: 75
FEV1/FVC EXP: 78
FEV1/FVC: 60
FEV1: 2.42
FVC%: 97
FVC: 4.06
RESPONSE TO BRONCHODILATOR: 2
RV%: 87
RV: 1.98
TLC%: 96
TLC: 6.22
VC%: 102
VC: 4.24

## 2015-12-28 NOTE — Unmapped (Signed)
FYI -- Mark from [place] states that they continue to receive a denial thru caresource.  Loraine Leriche stated he was new, was going to check with his mgr about this.  Shared with Loraine Leriche the msg that was left on 11/27 re: coverage.   Phone [phone]      Luretha Murphy [place], MA    ?? 12/25/15 10:38 AM   Note      Received fax from [facility] stating they do not have updates ins coverage for pt. They said they have tried to contact pt. I verified that [employer] is active. Called Salome Arnt @ [phone] and LVM stating ins info to her.

## 2015-12-28 NOTE — Telephone Encounter (Signed)
Spoke to pt. Pt stated that he does have [employer] ID# [id number]. Called and spoke to [place] at [facility]. Told him I spoke to pt and verified that he stated that is is correct ins info. Ran it through our system, [employer] verified that it was active. Also ran [facility] # [id number] (from pt's [employer] card). It stated that it was a plan mismatch that pt has [employer]. Called [facility] Medicaid automated system. Using [facility] Med # and DOS 12/12/15, it stated pt was active for that date. I called [employer] and checked [employer] # and It stated as of 10/29/15 pt was active. I called Loraine Leriche back and told him this information. Gave him [facility] Medicaid's # to call them to see what was going on. Mark's # [phone] X [phone].

## 2015-12-28 NOTE — Unmapped (Signed)
Placed call to pt per Dr. Damaris Hippo advised of results below  Pt voiced understanding confirmed pts upcoming appt      Danny Quale, MD  Darliss Ridgel, MA      ??      Please let him know that he has COPD but mild , which is good

## 2016-03-06 ENCOUNTER — Ambulatory Visit: Payer: PRIVATE HEALTH INSURANCE | Attending: Pulmonary Disease

## 2016-04-15 NOTE — Telephone Encounter (Signed)
[name] from [place] states a refill request was sent over they can be reached at [phone] ex [phone].  Asking for refill on Baraclude entecavir (BARACLUDE) 0.5 MG tablet

## 2016-04-16 MED ORDER — entecavir (BARACLUDE) 0.5 MG tablet
0.5 | ORAL_TABLET | Freq: Every day | ORAL | 2 refills | Status: AC
Start: 2016-04-16 — End: 2016-04-19

## 2016-04-19 NOTE — Unmapped (Signed)
Requested Prescriptions     Pending Prescriptions Disp Refills   ??? entecavir (BARACLUDE) 0.5 MG tablet 30 tablet 2     Sig: Take 1 tablet (0.5 mg total) by mouth daily.     Pt is out or refills. Pleas advise Pharmacy can be reached at [phone]

## 2016-05-13 NOTE — Telephone Encounter (Signed)
Letter drafted for Dr. Dalbert Garnet approval.

## 2016-05-13 NOTE — Telephone Encounter (Signed)
Pt requests a return to work letter. Please give the pt a call when the letter is ready for pick up.

## 2016-07-09 ENCOUNTER — Encounter: Primary: Family Medicine

## 2016-07-15 ENCOUNTER — Encounter: Admit: 2016-07-15 | Primary: Family Medicine

## 2016-07-15 ENCOUNTER — Inpatient Hospital Stay
Admit: 2016-07-15 | Discharge: 2016-07-15 | Disposition: A | Discharge status: Home / Self Care | Attending: Emergency Medicine

## 2016-07-15 DIAGNOSIS — R42 Dizziness and giddiness: Secondary | ICD-10-CM

## 2016-07-15 LAB — CBC WITH AUTO DIFFERENTIAL
Basophils %: 0.9 %
Basophils Absolute: 0.1 10*3/uL (ref 0.0–0.2)
Eosinophils %: 1.5 %
Eosinophils Absolute: 0.1 10*3/uL (ref 0.0–0.6)
Hematocrit: 41.7 % (ref 40.5–52.5)
Hemoglobin: 14.2 g/dL (ref 13.5–17.5)
Lymphocytes %: 33.2 %
Lymphocytes Absolute: 2.2 10*3/uL (ref 1.0–5.1)
MCH: 33.3 pg (ref 26.0–34.0)
MCHC: 34 g/dL (ref 31.0–36.0)
MCV: 97.9 fL (ref 80.0–100.0)
MPV: 9.4 fL (ref 5.0–10.5)
Monocytes %: 8.9 %
Monocytes Absolute: 0.6 10*3/uL (ref 0.0–1.3)
Neutrophils %: 55.5 %
Neutrophils Absolute: 3.6 10*3/uL (ref 1.7–7.7)
Platelets: 150 10*3/uL (ref 135–450)
RBC: 4.26 M/uL (ref 4.20–5.90)
RDW: 13.9 % (ref 12.4–15.4)
WBC: 6.5 10*3/uL (ref 4.0–11.0)

## 2016-07-15 LAB — POCT VENOUS
CO2: 27 mmol/L (ref 21–32)
Calcium, Ionized: 1.18 mmol/L (ref 1.12–1.32)
GFR African American: >60
GFR Non-African American: >60 (ref >60)
POC Anion Gap: 13 (ref 10–20)
POC BUN: 20 mg/dL — ABNORMAL HIGH (ref 7–18)
POC Chloride: 101 mmol/L (ref 99–110)
POC Creatinine: 1.2 mg/dL (ref 0.8–1.3)
POC Glucose: 75 mg/dl (ref 70–99)
POC Potassium: 3.5 mmol/L (ref 3.5–5.1)
POC Sodium: 141 mmol/L (ref 136–145)
POC Troponin I: 0 ng/mL (ref 0.00–0.10)

## 2016-07-15 LAB — MICROSCOPIC URINALYSIS

## 2016-07-15 LAB — POC URINE WITH MICROSCOPIC
Blood, Urine: NEGATIVE
Glucose, Ur: NEGATIVE mg/dL
Ketones, Urine: 15 mg/dL — AB
Nitrite, Urine: NEGATIVE
Protein, UA: 30 mg/dL — AB
Specific Gravity, UA: 1.025 (ref 1.005–1.030)
Urobilinogen, Urine: 1 E.U./dL (ref <2.0)
pH, UA: 6 (ref 5.0–8.0)

## 2016-07-15 MED ORDER — CEPHALEXIN 500 MG PO CAPS
500 MG | ORAL_CAPSULE | Freq: Four times a day (QID) | ORAL | 0 refills | Status: AC
Start: 2016-07-15 — End: 2016-07-22

## 2016-07-15 MED ORDER — SODIUM CHLORIDE 0.9 % IV BOLUS
0.9 % | Freq: Once | INTRAVENOUS | Status: AC
Start: 2016-07-15 — End: 2016-07-15
  Administered 2016-07-15: 17:00:00 1000 mL via INTRAVENOUS

## 2016-07-15 MED ORDER — SODIUM CHLORIDE 0.9 % IV SOLN
0.9 % | INTRAVENOUS | Status: DC
Start: 2016-07-15 — End: 2016-07-15

## 2016-07-15 MED ORDER — SODIUM CHLORIDE 0.9 % IV BOLUS
0.9 % | Freq: Once | INTRAVENOUS | Status: AC
Start: 2016-07-15 — End: 2016-07-15
  Administered 2016-07-15: 19:00:00 1000 mL via INTRAVENOUS

## 2016-07-15 MED ORDER — SODIUM CHLORIDE 0.9 % IV BOLUS
0.9 % | Freq: Once | INTRAVENOUS | Status: AC
Start: 2016-07-15 — End: 2016-07-15
  Administered 2016-07-15: 20:00:00 1000 mL via INTRAVENOUS

## 2016-07-15 MED ORDER — CEFTRIAXONE SODIUM 1 G IJ SOLR
1 g | Freq: Once | INTRAMUSCULAR | Status: AC
Start: 2016-07-15 — End: 2016-07-15
  Administered 2016-07-15: 20:00:00 1 g via INTRAVENOUS

## 2016-07-15 MED FILL — CEFTRIAXONE SODIUM 1 G IJ SOLR: 1 g | INTRAMUSCULAR | Qty: 1

## 2016-07-15 MED FILL — SODIUM CHLORIDE 0.9 % IV SOLN: 0.9 % | INTRAVENOUS | Qty: 1000

## 2016-07-15 NOTE — ED Notes (Signed)
Patient aware of need for urine specimen, unable to provide at this time. Patient provided with urinal, instructed to call when able to provide.      Clifton Custard, RN  07/15/16 1252

## 2016-07-15 NOTE — ED Provider Notes (Signed)
-    [place]  EMERGENCY DEPARTMENT ENCOUNTER          ATTENDING PHYSICIAN NOTE       Date of evaluation: 07/15/2016    Chief Complaint     Nursing Notes, Past Medical Hx, Past Surgical Hx, Social Hx, Allergies, and Family Hx were reviewed.    No chief complaint on file.      History of Present Illness     [name] Peck is a 61 y.o. male who presents To the emergency department with acute episode of dizziness.  The patient has a medical history of COPD, hypertension, previous left-sided MCA stroke with minimal residual deficit deficits on the right presenting the emergency department stating that he was outside waiting in line to get into a building and he started feeling dizzy.  He specifically states he felt woozy without vertigo.  He did not feel any specific preceding chest pain, short of breath, neck pain, abdominal pain, back pain, or headache.  He developed a mild gradual onset of headache after slumping backwards and hitting his neck and head on a wall.  He reports the pain as 2 out of 10 in severity.  Nonradiating.  Throbbing character.  Nothing particular makes it worse or better.  It's been constant since this happened about an hour ago.  No temporal association.  He specifically currently denies any chest pain, shortness breath, abdominal pain, back pain, and states his headache is improved just with being here.  It was not thunderclap in nature.  He did not have syncope.  He states he feels some tingling in his right hand but did not have any weakness in the right or upper lower extremity.    Review of Systems     Patient specifically denies Fever, chest pain, shortness of breath.  Please see HPI for additional pertinent positives and negatives.  All other systems were reviewed and were negative.      Past Medical, Surgical, Family, and Social History     He has a past medical history of Anxiety and depression; Cervical pain (neck); Chronic back pain; COPD (chronic obstructive pulmonary  disease) ([place]); Degenerative cervical disc; GERD (gastroesophageal reflux disease); Hyperlipidemia; Hypertension; Obstructive sleep apnea; and Sleep apnea.  He has a past surgical history that includes hernia repair (08/2009); Foot surgery (08-05-13); Colonoscopy; Toe Surgery (06-04-12); and bronchoscopy (09/23/13).  His family history includes High Blood Pressure in his mother.  He reports that he has been smoking Cigarettes.  He has a 20.00 pack-year smoking history. He does not have any smokeless tobacco history on file. He reports that he does not drink alcohol or use drugs.    Medications     Previous Medications    ALBUTEROL (PROVENTIL HFA;VENTOLIN HFA) 108 (90 BASE) MCG/ACT INHALER    Inhale 2 puffs into the lungs every 6 hours as needed    AMLODIPINE (NORVASC) 10 MG TABLET    Take 10 mg by mouth daily    BLOOD GLUCOSE MONITORING SUPPL (BLOOD GLUCOSE MONITOR KIT) KIT    by Does not apply route.    BUDESONIDE-FORMOTEROL (SYMBICORT) 160-4.5 MCG/ACT AERO    Inhale 2 puffs into the lungs 2 times daily    BUPROPION (WELLBUTRIN SR) 100 MG SR TABLET    Take 100 mg by mouth 2 times daily Ran out of prescription    CYCLOBENZAPRINE (FLEXERIL) 10 MG TABLET    Take 1 tablet by mouth 3 times daily as needed.    FLUOXETINE (PROZAC)  20 MG CAPSULE    Take 20 mg by mouth daily Ran out of prescription    GLUCOSE BLOOD (BLOOD GLUCOSE TEST STRIPS) STRP    Test daily    LORATADINE 10 MG CAPS    Take 1 tablet by mouth daily    LOSARTAN (COZAAR) 100 MG TABLET    Take 100 mg by mouth daily    METFORMIN (GLUCOPHAGE-XR) 500 MG XR TABLET    Take 1 tablet by mouth 2 times daily.    NABUMETONE (RELAFEN) 500 MG TABLET    Take 1 tablet by mouth 2 times daily.    NAPROXEN (NAPROSYN) 500 MG TABLET    Take 1 tablet by mouth 2 times daily (with meals).    OMEPRAZOLE (PRILOSEC) 20 MG CAPSULE    Take 1 capsule by mouth Daily.    TAMSULOSIN (FLOMAX) 0.4 MG CAPSULE    Take 0.4 mg by mouth daily       Allergies     He is allergic to  penicillins.    Physical Exam     INITIAL VITALS:  ,  ,  ,  ,     Physical Exam    INITIAL VITALS: There were no vitals taken for this visit.     General:  Well-developed, no acute distress  HEENT: Atraumatic, normocephalic. PERRLA, EOMI, oropharynx clear and moist  Neck: No meningismus, no JVD  Chest: CTA B/L without wheezing, rales or rhoncii  Cardiovascular: RRR, Normal S1/S2, no M/R/G.  No edema.  Intact peripheral pulses  Abdomen: Soft, nontender, nondistended.  No guarding, rebound, or HSM  Musculoskeletal: No joint deformity  GU: Deferred  Neurologic: AAO x 4, fluent speech without dysarthria or aphasia, moves all extremities symmetrically with equal strength.  Skin: No obvious rashes, petechiae, or purpura  Psych: Appropriate    Diagnostic Results     EKG   EKG Interpretation    Interpreted by me    Rhythm: normal sinus   Rate: normal  Axis: normal  Ectopy: none  Conduction: normal  ST Segments: no acute change  T Waves: no acute change  Q Waves: none    Clinical Impression: no acute changes and normal EKG    RADIOLOGY:  No orders to display       LABS:   No results found for this visit on 07/15/16.    ED BEDSIDE ULTRASOUND:      RECENT VITALS:   ,  ,  ,  ,       Procedures         ED Course     Nursing Notes, Past Medical Hx, Past Surgical Hx, Social Hx, Allergies, and Family Hx were reviewed.    The patient was given the following medications:  No orders of the defined types were placed in this encounter.      CONSULTS:  None    MEDICAL DECISION MAKING / ASSESSMENT / PLAN     [name] Peck is a 61 y.o. male presenting to the [facility] Emergency Department today with No chief complaint on file.    [name]  Upon appropriate History, Physical Exam, and ancillary study information performed in JB17/B17, at this time my clinical assessment of his presentation is most consistent with Dizziness and presyncope likely secondary to mild dehydration and urinary tract infection.  Patient presents emergency department  presyncopal symptoms.  He has chronic neck and back pain that is unchanged today.  He presents with soft blood pressures in the 29H to 37J systolic  but was a symptomatically emergent department.  After couple liters of fluid he felt dramatically better but still soft blood pressures in the low 90 systolic with a normal baseline systolic about 094 per chart review.  The patient did report some persistent tingling in his right hand that I believe is likely secondary to recrudessence of his symptoms of a previous stroke.  He currently does not appear to have any deficits on exam or on questioning.  His only symptom was dizziness without any significant weakness or numbness and only had some tingling in his right hand.  I suspicion that this is related to an acute ischemic stroke or TIA is low.  He had no chest pain and his EKG was nonischemic.  My suspicion for acute coronary syndrome is low.  He has no abdominal type pain, new back pain, pulsatile masses concern for AAA.  He has no thoracic back pain or chest pain concerning for dissection.  He has no arrhythmias on EKG.  I got a urine actually look for ketones given that he has no urinary symptoms of UTI and he does have ketones and B1 elevation consistent with dehydration however he also had 50-100 white blood cells on his microscopy and 4+ bacteria.  On questioning the patient has no dysuria or frequency or any concerning sexual history.  I think culture and urine DNA to assess for these.  I believe is appropriate for outpatient anabiotic.  He received a third liter fluid and his most recent pressure is about 709 systolic.  He refuses admission at this point regardless stating he has too much to home and feels well much better after fluids and food and would like to be discharged home.  I believe this is reasonable given that he does have an outpatient primary care provider that he can follow-up with and he was given appropriate discharge instructions return  precautions for UTI and presyncope..        The patient was in agreement with the evaluation and management discussed at the bedside, and after verbal and written discharge instructions were provided was able to confirm understanding of follow up and return precautions.      Critical Care:  Due to the immediate potential for life-threatening deterioration due to hypotension, I spent 45 minutes providing critical care.  This time excludes time spent performing procedures but includes time spent on direct patient care, history retrieval, review of the chart, and discussions with patient, family, and consultant(s).    Clinical Impression     Presyncope  UTI    Disposition     D/c         Danise Mina III, MD  07/15/16 [phone]

## 2016-07-15 NOTE — ED Notes (Signed)
Patient reports dizziness this am with standing. Patient reports he fell backwards against a wall, pain to neck and upper back. Patient denies LOC. Patient also c/o headache started this am. Patient denies chest pain, SOB. Alert, oriented x4 on arrival, resting comfortably in bed, provided with boxed lunch per MD.      Clifton Custard, RN  07/15/16 1240

## 2016-07-15 NOTE — ED Notes (Signed)
Patient to x-ray.      Clifton Custard, RN  07/15/16 1252

## 2016-07-15 NOTE — ED Notes (Signed)
Dr. Phineas Douglas aware of current B/P 84/48. See orders given.     Clifton Custard, RN  07/15/16 [phone]

## 2016-07-15 NOTE — ED Notes (Signed)
Medication list in progress, will need to be reviewed.      Clifton Custard, RN  07/15/16 1239

## 2016-07-15 NOTE — ED Notes (Signed)
Urine sent to POCT.      Clifton Custard, RN  07/15/16 1340

## 2016-07-15 NOTE — ED Notes (Signed)
Bed: B17  Expected date:   Expected time:   Means of arrival:   Comments:  [facility]ic [id number] RN  07/15/16 1223

## 2016-07-16 LAB — EKG 12-LEAD
Atrial Rate: 69 {beats}/min
P Axis: 70 degrees
P-R Interval: 134 ms
Q-T Interval: 408 ms
QRS Duration: 88 ms
QTc Calculation (Bazett): 437 ms
R Axis: 73 degrees
T Axis: 71 degrees
Ventricular Rate: 69 {beats}/min

## 2016-07-16 LAB — C.TRACHOMATIS N.GONORRHOEAE DNA, URINE
C. trachomatis DNA ,Urine: NEGATIVE
N. gonorrhoeae DNA, Urine: NEGATIVE

## 2016-07-17 LAB — CULTURE, URINE: Urine Culture, Routine: <50000

## 2016-07-26 ENCOUNTER — Encounter: Primary: Family Medicine

## 2016-07-29 NOTE — Discharge Instructions (Signed)
1. Keep injection /surgical site dry until the band-aid/ dressing is removed.Please remove band-aids from the injection site 12-24 hours after the procedure.     2. If the injection site is sore, you may apply an ice pack to that area for twenty minutes every two hours for the initial twenty -four hours.    3. Occasionally you may notice slight increase in pain after the procedure. This should start to improve  within the next twenty four- to forty eight hours.    4. Remember, it may take as long as forty - eight hours before you notice a gradual improvement in your pain and other symptoms.    5.You may continue to take your pain medication as needed.    6.DO NOT stop any other medication that was previously prescribed.    7. Unless otherwise instructed, you may continue with your normal activities once you leave the office. In general , you should avoid any prolong standing, walking, or repeated bending or heaving use of your upper extremities for twenty four to forty- eight hours.    8. If you are currently going to physical therapy, continue to do so unless your doctor instructs you no to.    9. If you develop any other symptoms such fever, rash unusual drainage from the site, severe headache or weakness, please call [phone].    10. Other: If you had sedation ; no drinking alcohol, sign any legal documents  And no driving today.    11. Side effects of steroids can include: Nervous energy, hot flashes facial flushing, increased blood sugar levels and or muscle spasms.

## 2016-07-30 ENCOUNTER — Inpatient Hospital Stay: Attending: Anesthesiology | Primary: Family Medicine

## 2016-07-30 DIAGNOSIS — D491 Neoplasm of unspecified behavior of respiratory system: Secondary | ICD-10-CM

## 2016-07-30 MED ORDER — METHYLPREDNISOLONE ACETATE 80 MG/ML IJ SUSP
80 | INTRAMUSCULAR | Status: AC
Start: 2016-07-30 — End: 2016-07-30

## 2016-07-30 MED ORDER — IOPAMIDOL 61 % IJ SOLN
61 | INTRAMUSCULAR | Status: AC
Start: 2016-07-30 — End: 2016-07-30

## 2016-07-30 MED ORDER — FENTANYL CITRATE (PF) 100 MCG/2ML IJ SOLN
100 | INTRAMUSCULAR | Status: AC
Start: 2016-07-30 — End: 2016-07-30

## 2016-07-30 MED ORDER — MIDAZOLAM HCL 2 MG/2ML IJ SOLN
2 | INTRAMUSCULAR | Status: AC
Start: 2016-07-30 — End: 2016-07-30

## 2016-07-30 MED FILL — FENTANYL CITRATE (PF) 100 MCG/2ML IJ SOLN: 100 MCG/2ML | INTRAMUSCULAR | Qty: 2

## 2016-07-30 MED FILL — ISOVUE-M 300 61 % IJ SOLN: 61 % | INTRAMUSCULAR | Qty: 15

## 2016-07-30 MED FILL — MIDAZOLAM HCL 2 MG/2ML IJ SOLN: 2 MG/ML | INTRAMUSCULAR | Qty: 2

## 2016-07-30 MED FILL — DEPO-MEDROL 80 MG/ML IJ SUSP: 80 MG/ML | INTRAMUSCULAR | Qty: 1

## 2016-08-13 MED ORDER — entecavir (BARACLUDE) 0.5 MG tablet
0.5 | ORAL_TABLET | Freq: Every day | ORAL | 2 refills | Status: AC
Start: 2016-08-13 — End: 2021-03-21

## 2016-09-05 ENCOUNTER — Inpatient Hospital Stay: Admit: 2016-09-05 | Discharge: 2016-09-09 | Payer: Worker's Compensation | Attending: Pain Medicine

## 2016-09-05 DIAGNOSIS — M47812 Spondylosis without myelopathy or radiculopathy, cervical region: Secondary | ICD-10-CM

## 2016-09-24 ENCOUNTER — Inpatient Hospital Stay: Admit: 2016-09-24 | Payer: PRIVATE HEALTH INSURANCE

## 2016-09-24 DIAGNOSIS — D491 Neoplasm of unspecified behavior of respiratory system: Secondary | ICD-10-CM

## 2016-09-24 MED ORDER — OMNIPAQUE (iohexol) 350 mg iodine/mL 50 mL
350 | Freq: Once | INTRAVENOUS | Status: AC | PRN
Start: 2016-09-24 — End: 2016-09-24
  Administered 2016-09-24: 17:00:00 80 mL via INTRAVENOUS

## 2016-09-24 MED FILL — OMNIPAQUE 350 MG IODINE/ML INTRAVENOUS SOLUTION: 350 350 mg iodine/mL | INTRAVENOUS | Qty: 50

## 2016-10-08 ENCOUNTER — Encounter: Admit: 2016-10-08 | Discharge: 2016-10-08 | Payer: PRIVATE HEALTH INSURANCE

## 2016-10-08 DIAGNOSIS — M47812 Spondylosis without myelopathy or radiculopathy, cervical region: Secondary | ICD-10-CM
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
## 2016-10-22 ENCOUNTER — Ambulatory Visit: Admit: 2016-10-22 | Discharge: 2016-10-22 | Payer: PRIVATE HEALTH INSURANCE

## 2016-10-22 DIAGNOSIS — M47812 Spondylosis without myelopathy or radiculopathy, cervical region: Secondary | ICD-10-CM

## 2016-10-22 NOTE — Unmapped (Signed)
Physical Therapy Initial Evaluation and Plan of Care  Name: [name]     Date of Birth: 1955-06-24      MRN: [id number]    Date of evaluation: 10/22/2016  Referring Physician: Ander Slade, MD  [address]  [place], [place] [address] Phone: [phone] Fax:   Specific Order: Eval and treat   Date of Onset: Chronic history: 10 to 12 years ago. Pain has progressed over time.  Primary Medical Diagnosis:   1. Spondylosis of cervical region without myelopathy or radiculopathy       Insurance: Payor: [employer] / Plan: [employer]ESOURCE / Product Type: Medicaid [place] care /       Patient/Family Goals: By discharge, pt would like to: be able to carry his 20 month old daughter (weighs 25#) w/ less pain and more strength  Short Term Goals:  3wks  evaluation status  10/08/2016   patient will improve neck active  ROM to (R) SB 30 deg  (L) rotation: 60 deg in order to improve function.. Current: (L) SB: 20 deg (R) rotation: 55 deg   patient will improve cervical  strength to 4+/5 to 5/5  Current: flexion/ extension: 4/5  Side bend: 4-/5 B/L   Long Term Goals: time frame at time of D/C     patient will improve B/L scapular and UE strength to 4+/5 to 5/5 in order to improve lifting capacity and improve posture*. Current: overall 4/5 to 4+/5   patient will reduce pain to by at least 50% on 0-10 pain scale to allow greater mobility. Current: 7/10 worst 10/10   patient will perform home exercise program based on therapy recommendations independently to maintain benefits gained in physical therapy. Patient is currently dependent in the execution and progression of his HEP         DAILY PT TREATMENT NOTES    Diagnosis:   1. Spondylosis of cervical region without myelopathy or radiculopathy             Referring Provider:Tayeb, Earnie Larsson,*    Insurance plan:   Payor/Plan Subscr DOB Sex Relation Sub. Ins. ID Effective Group Num   1. [employer] - [name], [name] March 19, 1955 Male  [id number] 11/16/15 [employer]                                    [address]                         # of visits per insurance authorization: 30 v  # of visits per POC: 12 v     Date of Initial Eval: 10/08/2016    Verified Signed POC:    []  YES  [x]   NO   Date:    Message to Referral Source for signature via InBasket/ EMR []        Fax []        Mail []          Date:    Routed to PCP (if other than referring) for signature via   EMR []  Fax []    Mail []   Date:    Was POC updated?   []   YES []   NO    Date:        If yes, Verified Signed POC:    []   YES  []   NO   Date:  ______________________________________________________________________  PRIMARY THERAPIST:  KAREN SAUER,PT    Subjective: Pt. Reports having no pain in neck today.     Objective: C spine AROM: (L) SB: 30 degrees, (R) rotation: 30 degrees  MMT: (B) scapular and UE: 4/5 to 4+/5   C spine: 4-/5 to 4/5        Therapeutic exercises performed today are noted in the log below.  They have been modified to suit current functional status and instructed for proper form to protect joint surfaces and soft tissue while enhancing flexibility and strength.    Manual techniques were used today as indicated to improve resting muscle tone and flexibility in muscle groups that impact pain perception and active motion.    Exercise Log:  VISIT 1 2 3 4    DATE 10/08/16 10/22/16     Pain Report Current 7/10 0/10     PT Evaluation:  26 min      Therapeutic Exercise: 97110 15 min      Neck flattening Supine w/ towel roll 5 H x 10      Wand: overhead Supine 5 H  X 10       Pull Downs Green x 10 to 3 x 10 50# 3 x 10     Low rows Green x 10 to 3 x 10 50# 3 x 10     UT stretch  2 x 30 H     Chin tucks  X 10     Doorway stretch  3 x 15 H     Pulleys  3 min     UBE  2.0 3 min F/R       Patient Education:10/22/2016 :patient received education on the purpose of therapy, participated in the development of the POC and verbalized understanding and agreement of POC, goals, initial HEP. Written instructions of today's HEP were provided to  patient for home use    Assessment: Pt. Demonstrates the above mentioned deficits. Pt. Reports not having pain in neck for the past two months. This PTA asked if he felt comfortable performing HEP independently and ready to be D/C, in which the pt. Agreed that he was.   Plan: Will see 2 times per week for initial 4 to 6 weeks     MINUTES of TREATMENT   Evaluation/ Re evaluation    Therapeutic Exercise 24   Therapeutic Activity    Gait Training    Manual Therapy    Iontophoresis    Ultrasound    Electrical Stimulation    Neuromuscular Rehab (balance training)    Ice/ Heat    Total Treatment Time 24

## 2016-10-24 ENCOUNTER — Encounter

## 2016-10-24 NOTE — Unmapped (Signed)
Physical Therapy Non-Visit Discharge Summary  Patient Name: [name]    VW#:[id number]   DOB: 1955-11-22  Referring Physician:Zeeshan Myra Gianotti, MD  Date: 10/24/2016    Diagnosis:   1. Spondylosis of cervical region without myelopathy or radiculopathy         # of visits completed: 2      # cancels/ no shows: 0    Hollis SAAGAR TORTORELLA  was unable to complete recommended plan of care due to:[]  non-compliance []  medical complication []  lack of transportation []  symptoms/ condition resolved [x]  other:on 10/22/16: pain rating was 0/10 and patient wanted to focus on his HEP rather than completing the recommended POC.     Refer to last progress note dated 10/22/16 for latest status toward goals. Lindsey GILBERT MANOLIS will be discharged at this time. Asencion Partridge will require an updated prescription/ referral to resume therapy.    Physical Therapist Signature:  Daivd Council , PT      Date:  10/24/2016

## 2016-10-28 ENCOUNTER — Encounter

## 2016-10-31 ENCOUNTER — Encounter

## 2016-11-04 ENCOUNTER — Encounter

## 2016-11-07 ENCOUNTER — Encounter

## 2016-11-11 ENCOUNTER — Encounter

## 2016-11-14 ENCOUNTER — Encounter

## 2016-11-20 ENCOUNTER — Inpatient Hospital Stay: Admit: 2016-11-20 | Discharge: 2016-12-17 | Payer: PRIVATE HEALTH INSURANCE

## 2016-11-20 DIAGNOSIS — R918 Other nonspecific abnormal finding of lung field: Secondary | ICD-10-CM

## 2016-11-20 LAB — POC EGFR MDRD NON AF AMERICAN: POC EGFR MDRD Non Af American: 59 mL/min/{1.73_m2}

## 2016-11-20 LAB — POC GFR MDRD AF AMER: POC GFR MDRD Af Amer: >60 mL/min/{1.73_m2}

## 2016-11-20 LAB — POC SAMPLE TYPE

## 2016-11-20 LAB — POCT CREATININE: POC Creatinine: 1.24 mg/dL (ref 0.60–1.30)

## 2016-11-20 MED ORDER — OMNIPAQUE (iohexol) 350 mg iodine/mL 100 mL
350 | Freq: Once | INTRAVENOUS | Status: AC | PRN
Start: 2016-11-20 — End: 2016-11-20
  Administered 2016-11-20: 14:00:00 100 mL via INTRAVENOUS

## 2016-11-20 MED FILL — OMNIPAQUE 350 MG IODINE/ML INTRAVENOUS SOLUTION: 350 350 mg iodine/mL | INTRAVENOUS | Qty: 100

## 2016-12-20 NOTE — Unmapped (Signed)
Spoke with pt. Moved appt up to 11/28 at 2:15p arrival at [facility]

## 2016-12-20 NOTE — Unmapped (Signed)
-----   Message from Fabian Sharp, RN sent at 12/20/2016 11:37 AM EST -----  This patient is a referral from Dr. Darrow Bussing.  He currently has an appointment on 12/19 but Dr. Darrow Bussing felt something suspicious in his neck.  He has a history of neoplasm of the supraglottis.  Can you get him in earlier than the 19th.  Thanks, Catrina    ----- Message -----  From: Fabian Sharp, RN  Sent: 12/20/2016   8:55 AM  To: Fabian Sharp, RN        ----- Message -----  From: Jeanett Schlein  Sent: 12/18/2016   3:59 PM  To: Ala Dach, RN    Scheduled for 12/19 with Dr. Micheal Likens.  Do we need to bring him in sooner?

## 2016-12-25 ENCOUNTER — Ambulatory Visit: Admit: 2016-12-25 | Discharge: 2016-12-25 | Payer: PRIVATE HEALTH INSURANCE

## 2016-12-25 DIAGNOSIS — Z08 Encounter for follow-up examination after completed treatment for malignant neoplasm: Secondary | ICD-10-CM

## 2016-12-25 NOTE — Unmapped (Signed)
Chief Complaint   Patient presents with   ??? New Patient Visit/ Consultation        History of Present Illness  Long time smoker with slight voice changes. He has no dysphagia, no hemotysis.      Pain: no  Otalgia: no  Odynophagia: no  Dysphagia: No  Dyspnea: No  Lumps in neck: No  Hemoptysis: no  Fever: No  Chills: No  Sweats: No  Weight Loss: No       Cancer Staging  Cancer Staging  No matching staging information was found for the patient.    Histories  He has a past medical history of COPD (chronic obstructive pulmonary disease) (CMS Dx); Diabetes mellitus (CMS Dx); Dysphagia; GERD (gastroesophageal reflux disease); Hepatitis; and Hypertension.    He has a past surgical history that includes Foot surgery (Left); Esophagogastroduodenoscopy (N/A, 11/19/2013); and Hernia repair.    His family history is not on file.    He reports that he has been smoking Cigarettes.  He has a 1.25 pack-year smoking history. He has never used smokeless tobacco. He reports that he does not drink alcohol or use drugs.    Allergies  Penicillins    Medications  Outpatient Encounter Prescriptions as of 12/25/2016   Medication Sig Dispense Refill   ??? ALBUTEROL INHL Inhale into the lungs.     ??? amLODIPine (NORVASC) 10 MG tablet Take 10 mg by mouth daily     ??? aspirin 81 MG EC tablet Take 1 tablet (81 mg total) by mouth daily. 30 tablet 0   ??? atorvastatin (LIPITOR) 80 MG tablet Take 1 tablet (80 mg total) by mouth at bedtime. 30 tablet 0   ??? blood sugar diagnostic Strp Test daily     ??? blood-glucose meter Misc by Does not apply route.     ??? buPROPion SR (WELLBUTRIN SR) 100 MG tablet Take 100 mg by mouth 2 times daily Ran out of prescription     ??? entecavir (BARACLUDE) 0.5 MG tablet Take 1 tablet (0.5 mg total) by mouth daily. 30 tablet 2   ??? hydroCHLOROthiazide (MICROZIDE) 12.5 mg capsule Take 1 capsule (12.5 mg total) by mouth daily. 30 capsule 3   ??? ibuprofen (ADVIL,MOTRIN) 800 MG tablet Take 0.5 tablets (400 mg total) by mouth every 6  hours as needed for up to 16 doses. 16 tablet 0   ??? ketoconazole (NIZORAL) 2 % cream      ??? loratadine 10 mg Cap Take 1 tablet by mouth daily     ??? losartan (COZAAR) 100 MG tablet Take 1 tablet (100 mg total) by mouth daily. Take 100 mg by mouth daily 30 tablet 0   ??? metFORMIN (GLUCOPHAGE) 500 MG tablet Take 500 mg by mouth 2 times a day with meals.     ??? ondansetron (ZOFRAN ODT) 4 MG disintegrating tablet Take 1 tablet (4 mg total) by mouth every 6 hours as needed for Nausea. 16 tablet 0   ??? orphenadrine (NORFLEX) 100 mg tablet Take 1 tablet (100 mg total) by mouth 2 times a day. 10 tablet 0   ??? oxyCODONE (ROXICODONE) 5 MG immediate release tablet Take 1 tablet (5 mg total) by mouth every 4 hours as needed for Pain. 30 tablet 0   ??? FLUoxetine (PROZAC) 20 MG capsule      ??? oxyCODONE-acetaminophen (PERCOCET) 10-325 mg per tablet 1 tablet every 4 hours as needed.        ??? tamsulosin (FLOMAX) 0.4 mg Cp24 Take 0.4  mg by mouth daily     ??? traZODone (DESYREL) 100 MG tablet        No facility-administered encounter medications on file as of 12/25/2016.         {    Review of Systems:  * See scanned Review of Systems sheet.    Vitals  Blood pressure 126/76, pulse 59, temperature 98 ??F (36.7 ??C), temperature source Temporal, resp. rate 16, height 6' (1.829 m), weight 181 lb (82.1 kg), SpO2 94 %.  Physical Exam  PHYSICAL EXAM   General:     General Appearance: well-developed, well-nourished and in no acute distress    Communication: I was able to converse well with the patient. Patient was able to answer questions adequately and appropriately.    Head & Face (general): normal appearance    Face (palpation): no sinus tenderness    Salivary Glands (palpation): salivary glands NL size.    Voice Quality: Normal  Eyes:     External: anicteric sclera, conjunctiva not injected, lids no lesions or swelling    Extraoccular Muscle: intact  Nose:     External: external nose without infection or abnormality.    Internal: anterior  rhinoscopy performed. Turbinates non-erythematous, non-swollen, no pus, septum midline, mucosa intact. No masses, polyps or pus. No septal perforation.  Ears:     External Ears: external ears are of normal appearance. No masses, lesions or scars.  House-Brackman Grading System for Facial Nerve Dysfunction:     (Left) Grade 1: Normal movement    (Right) Grade 1: Normal movement  Mouth:     Lip/teeth/gums: healthy dentition, lips, teeth and gums in good condition. no gingival inflammation, no labial lesions. Mucosa: No leukoplakia or masses. Hard/soft palates and tongue of NL symmetry.    Tongue: normal midline, normal mucosa    Oropharynx/ Tonsils: pharyngeal walls and tonsillar fossae without abnormalities.    Neck:     Neck Exam: overall appearance NL. No masses. Neck symmetric with NL tracheal position. Normal laryngeal crepitus. No masses, tenderness or enlargement of thyroid on palpation. Salivary glands NL size.    Thyroid Exam: no nodules, masses, tenderness or enlargement of thyroid on palpation.  Respiratory Inspection:     Respiratory Effort: breathing comfortably, no increased work of breathing.  Cardiovascular:     Carotid arteries: pulses 2+, symmetric, no bruits  Lymphatic:     Neck: no cervical, supraclavicular or auricular adenopathy  Skin:     Inspection: no lower extremity edema, rashes, lesions, or ulcerations, well developed, turgor intact    Palpation: no subcutaneous nodules or induration  Musculoskeletal:     Gait and Station: intact without difficulty  Neurologic:     Cranial Nerves: II - XII grossly intact  Mental Status:     Orientation: oriented to time, place, and person    Mood and affect: NL mood and affect. A+O x 4.      Flexible Laryngoscopy without biopsy  Anesthesia: Topical 1% Xylocaine with Afrin  Estimated Blood Loss: None    Procedure:   After obtaining consent, the patient was placed in the examination chair in the upright position.  Decongestant and topical anesthetic was  sprayed in the right and left nostrils.  After allowing adequate time for hemostatic effect, the flexible 4 mm laryngoscope was passed via the right and left nostrils.      Nasal Septum: normal;   Nasal Findings: normal;   Nasopharynx: normal   Eustachian Tube: normal   Oropharynx: normal  Base of Tongue: normal   Epiglottis: normal   True Vocal Cord normal   False Vocal Cord: normal   True Vocal Cord Movement: normal   Hypopharynx Mucosa: normal       * Patient tolerated the procedure well with no complications   * Patient was instructed not to eat for 30 minutes following procedure.   * Patient was instructed that they may notice minor bleeding.      Lab Review:   Lab Results   Component Value Date    WBC 6.8 07/30/2015    HGB 14.8 07/30/2015    HCT 44.3 07/30/2015    MCV 93.9 07/30/2015    PLT 135 (L) 07/30/2015    CREATININE 1.08 07/30/2015    BUN 22 07/30/2015    NA 137 07/30/2015    K 4.2 07/30/2015    CL 105 07/30/2015    CO2 26 07/30/2015    ALT 11 07/28/2015    AST 18 07/28/2015    ALKPHOS 87 07/28/2015    BILITOT 1.3 07/28/2015        Assessment  I do not see any mass or lesion. Ss of head and neck cancer reviewed. I reviewed prior CT. Pt tells me he is starting tx for advanced liver cancer. We will reexamine him in 6 mos or sooner if sx develop.    Plan       Medical Decision Making:

## 2017-01-15 ENCOUNTER — Ambulatory Visit: Payer: PRIVATE HEALTH INSURANCE

## 2017-01-24 ENCOUNTER — Emergency Department: Admit: 2017-01-24 | Payer: PRIVATE HEALTH INSURANCE

## 2017-01-24 ENCOUNTER — Inpatient Hospital Stay
Admit: 2017-01-24 | Discharge: 2017-01-24 | Disposition: A | Discharge status: Home / Self Care | Payer: PRIVATE HEALTH INSURANCE

## 2017-01-24 DIAGNOSIS — B349 Viral infection, unspecified: Secondary | ICD-10-CM

## 2017-01-24 LAB — VENOUS BLOOD GAS, LINE/SYRINGE
%HBO2-Line Draw: 77.7 % — ABNORMAL HIGH (ref 40.0–70.0)
Base Excess-Line Draw: 3.8 mmol/L — ABNORMAL HIGH (ref <=3.0)
CO2 Content-Line Draw: 29 mmol/L (ref 25–29)
Carboxyhgb-Line Draw: 5.9 % — ABNORMAL HIGH (ref 0.0–2.0)
HCO3-Line Draw: 28 mmol/L (ref 24–28)
Methemoglobin-Line Draw: 0.4 % (ref 0.0–1.5)
PCO2-Line Draw: 38 mmHg — ABNORMAL LOW (ref 41–51)
PH-Line Draw: 7.47 — ABNORMAL HIGH (ref 7.32–7.42)
PO2-Line Draw: 42 mmHg — ABNORMAL HIGH (ref 25–40)
Reduced Hemoglobin-Line Draw: 16 % — ABNORMAL HIGH (ref 0.0–5.0)

## 2017-01-24 LAB — INFLUENZA A AND B ASSAY, NAA
Influenza A: NEGATIVE
Influenza B: NEGATIVE

## 2017-01-24 LAB — POC GLU MONITORING DEVICE: POC Glucose Monitoring Device: 120 mg/dL — ABNORMAL HIGH (ref 70–100)

## 2017-01-24 MED ORDER — albuterol (PROVENTIL) nebulizer solution 2.5 mg
2.5 | Freq: Once | RESPIRATORY_TRACT | Status: AC
Start: 2017-01-24 — End: 2017-01-24
  Administered 2017-01-24: 07:00:00 2.5 mg via RESPIRATORY_TRACT

## 2017-01-24 MED ORDER — acetaminophen (TYLENOL) tablet 975 mg
325 | Freq: Once | ORAL | Status: AC
Start: 2017-01-24 — End: 2017-01-24

## 2017-01-24 MED ORDER — ipratropium-albuterol (DUO-NEB) 0.5 mg-3 mg(2.5 mg base)/3 mL nebulizer solution 3 mL
0.5 | Freq: Once | RESPIRATORY_TRACT | Status: AC
Start: 2017-01-24 — End: 2017-01-24
  Administered 2017-01-24: 07:00:00 3 mL via RESPIRATORY_TRACT

## 2017-01-24 MED ORDER — ketorolac (TORADOL) injection 15 mg
15 | Freq: Once | INTRAMUSCULAR | Status: AC
Start: 2017-01-24 — End: 2017-01-24
  Administered 2017-01-24: 06:00:00 15 mg via INTRAVENOUS

## 2017-01-24 MED FILL — ALBUTEROL SULFATE 2.5 MG/3 ML (0.083 %) SOLUTION FOR NEBULIZATION: 2.5 2.5 mg /3 mL (0.083 %) | RESPIRATORY_TRACT | Qty: 3

## 2017-01-24 MED FILL — IPRATROPIUM 0.5 MG-ALBUTEROL 3 MG (2.5 MG BASE)/3 ML NEBULIZATION SOLN: 0.5 0.5 mg-3 mg(2.5 mg base)/3 mL | RESPIRATORY_TRACT | Qty: 3

## 2017-01-24 MED FILL — KETOROLAC 15 MG/ML INJECTION SOLUTION: 15 15 mg/mL | INTRAMUSCULAR | Qty: 1

## 2017-01-24 NOTE — Unmapped (Signed)
Bed: A02U  Expected date:   Expected time:   Means of arrival:   Comments:

## 2017-01-24 NOTE — Unmapped (Signed)
Fever and chills for 2 hours.

## 2017-01-24 NOTE — Unmapped (Signed)
[facility] ED Note    Date of Service: 01/24/2017    Reason for Visit: Flu Like Symptoms and Fever-9 Weeks to 63 Years      Patient History     HPI:  This is a 61 y.o. male with history of Hypertension, COPD, and diabetes mellitus who presents with fevers, chills, and myalgias of relatively acute onset.  The patient states that his symptoms began approximately 3 hours prior to presentation.  Patient does not have did use over-the-counter analgesics or antipyretics for management of his symptoms.  Patient does endorse subjective shortness of breath with mild concomitant rhinorrhea.  No associated productive cough, nausea, vomiting, headache, or vision changes.  Patient states that he has not received an influenza vaccine this year as shots always make me very sick.    Past Medical History:   Diagnosis Date   ??? COPD (chronic obstructive pulmonary disease) (CMS Dx)    ??? Diabetes mellitus (CMS Dx)    ??? Dysphagia    ??? GERD (gastroesophageal reflux disease)    ??? Hepatitis    ??? Hypertension        Past Surgical History:   Procedure Laterality Date   ??? ESOPHAGOGASTRODUODENOSCOPY N/A 11/19/2013    Procedure: ESOPHAGOGASTRODUODENOSCOPY WITH MAC;  Surgeon: Charlane Ferretti, MD;  Location: [facility] ENDOSCOPY;  Service: Gastroenterology;  Laterality: N/A;   ??? FOOT SURGERY Left    ??? HERNIA REPAIR      about 5 yrs ago       DAJOHN ELLENDER  reports that he has been smoking Cigarettes.  He has a 1.25 pack-year smoking history. He has never used smokeless tobacco. He reports that he does not drink alcohol or use drugs.    Patient's Medications   New Prescriptions    No medications on file   Previous Medications    ALBUTEROL INHL    Inhale into the lungs.    AMLODIPINE (NORVASC) 10 MG TABLET    Take 10 mg by mouth daily    ASPIRIN 81 MG EC TABLET    Take 1 tablet (81 mg total) by mouth daily.    ATORVASTATIN (LIPITOR) 80 MG TABLET    Take 1 tablet (80 mg total) by mouth at bedtime.     BLOOD SUGAR DIAGNOSTIC STRP    Test daily    BLOOD-GLUCOSE METER MISC    by Does not apply route.    BUPROPION SR (WELLBUTRIN SR) 100 MG TABLET    Take 100 mg by mouth 2 times daily Ran out of prescription    ENTECAVIR (BARACLUDE) 0.5 MG TABLET    Take 1 tablet (0.5 mg total) by mouth daily.    FLUOXETINE (PROZAC) 20 MG CAPSULE        HYDROCHLOROTHIAZIDE (MICROZIDE) 12.5 MG CAPSULE    Take 1 capsule (12.5 mg total) by mouth daily.    IBUPROFEN (ADVIL,MOTRIN) 800 MG TABLET    Take 0.5 tablets (400 mg total) by mouth every 6 hours as needed for up to 16 doses.    KETOCONAZOLE (NIZORAL) 2 % CREAM        LORATADINE 10 MG CAP    Take 1 tablet by mouth daily    LOSARTAN (COZAAR) 100 MG TABLET    Take 1 tablet (100 mg total) by mouth daily. Take 100 mg by mouth daily    METFORMIN (GLUCOPHAGE) 500 MG TABLET    Take 500 mg by mouth 2 times a day with meals.    ONDANSETRON (ZOFRAN ODT)  4 MG DISINTEGRATING TABLET    Take 1 tablet (4 mg total) by mouth every 6 hours as needed for Nausea.    ORPHENADRINE (NORFLEX) 100 MG TABLET    Take 1 tablet (100 mg total) by mouth 2 times a day.    OXYCODONE (ROXICODONE) 5 MG IMMEDIATE RELEASE TABLET    Take 1 tablet (5 mg total) by mouth every 4 hours as needed for Pain.    OXYCODONE-ACETAMINOPHEN (PERCOCET) 10-325 MG PER TABLET    1 tablet every 4 hours as needed.       TAMSULOSIN (FLOMAX) 0.4 MG CP24    Take 0.4 mg by mouth daily    TRAZODONE (DESYREL) 100 MG TABLET       Modified Medications    No medications on file   Discontinued Medications    No medications on file       Allergies:   Allergies as of 01/24/2017 - Fully Reviewed 01/24/2017   Allergen Reaction Noted   ??? Penicillins Hives 04/22/2013       PMH: Nursing notes reviewed   PSH: Nursing notes reviewed   FH: Nursing notes reviewed   MEDS: Nursing notes and chart reviewed     Review of Systems     ROS: A full, ten-point review of systems was performed. Notable findings per HPI. All other pertinent systems reviewed and  negative.      Physical Exam     ED Triage Vitals [01/24/17 0023]   Vital Signs Group      Temp 101.3 ??F (38.5 ??C)      Temp Source Oral      Heart Rate 91      Heart Rate Source Monitor      Resp 18      SpO2 98 %      BP 148/81      MAP (mmHg)       BP Location Right arm      BP Method Automatic      Patient Position Sitting   SpO2 98 %   O2 Device None (Room air)       General:  Adult male resting comfortably in bed, appearing mildly uncomfortable but in no acute distress  HEENT:  Normocephalic, atraumatic; extraocular movements intact; moist mucous membranes, minimal clear rhinorrhea  Neck:  Neck supple, trachea midline  Pulmonary:  Lung sounds were somewhat diminished aeration but overall clear to auscultation without wheezing  Cardiac:  Regular rate and rhythm with no murmurs, rubs, or gallops   Abdomen:  Soft, nondistended  Musculoskeletal:  Atraumatic exam with no focal swelling or tenderness, no gross deformity, no peripheral clubbing, cyanosis, or edema  Vascular:  2+ peripheral pulses in bilateral upper and lower extremities   Skin:  Warm and well perfused without rashes or lesions   Neuro:  Alert and interactive, strength and sensation grossly intact  Psych:  Appropriate mood and affect       Diagnostic Studies     Labs:  Lab results reviewed - please see Epic for full details.    Radiology:  X-ray Chest PA and Lateral   Final Result   IMPRESSION:    No acute cardiopulmonary abnormality.      Approved by Doreene Adas on 01/24/2017 1:14 AM EST      I have personally reviewed the images and I agree with this report.      Report Verified by: Lana Fish, M.D. at 01/24/2017 1:32 AM EST  EKG:      Emergency Department Procedures       ED Course and MDM     GORDY GOAR is a 61 y.o. male with a history and presentation as described above in HPI.  The patient was evaluated by myself and the ED Attending Physician, Dr. Loleta Chance. All management and disposition plans were discussed and agreed  upon.    Following thorough evaluation, blood work, plain films of the chest, and rapid influenza testing were ordered to assess for possible pathology injury and the patient's symptoms.  In addition, the patient was administered 15 mg of intravenous ketorolac for antipyresis as well as nebulized ipratropium 0.5 g/albuterol 2.5 mg ??3.  Patient's diagnostic evaluation was overall reassuring with no evidence of active influenza infection necessitating treatment with oseltamivir.    Given patient's overall well-appearance in the context of reassuring vitals and diagnostic evaluation, the patient was deemed appropriate for discharge at this time. I have low concern for more concerning or emergent infectious or pulmonary pathology, such as pneumonia, viremia, or severe COPD exacerbation. My customary discharge instructions, including strict return precautions for new or worsening symptoms concerning to the patient, were provided. Patient was instructed to follow-up with his primary care provider for further evaluation as needed.  All of patient's questions were answered satisfactorily, and he was subsequently sent home in stable condition.    Impression     1. Viral illness    2. Viral upper respiratory tract infection           Marjean Donna, M.D., PGY-3   [facility]     Marjean Donna, MD  Resident  01/24/17 [phone]

## 2017-01-24 NOTE — Unmapped (Signed)
ED Attending Attestation Note    Date of service:  01/24/2017    This patient was seen by the resident physician.  I have seen and examined the patient, agree with the workup, evaluation, management and diagnosis. The care plan has been discussed and I concur.     My assessment reveals a 61 y.o. male who presents to the Emergency Department with complaints of fevers chills myalgias.  Also has had productive cough.  On examination he has some expiratory wheezing noted throughout without focal areas of decreased breath sounds

## 2017-03-20 ENCOUNTER — Other Ambulatory Visit: Payer: Self-pay

## 2017-03-20 ENCOUNTER — Emergency Department (HOSPITAL_COMMUNITY)
Admission: EM | Admit: 2017-03-20 | Discharge: 2017-03-20 | Disposition: A | Discharge status: Home / Self Care | Payer: 59 | Attending: Emergency Medicine | Admitting: Emergency Medicine

## 2017-03-20 ENCOUNTER — Encounter (HOSPITAL_COMMUNITY): Payer: Self-pay | Admitting: Emergency Medicine

## 2017-03-20 ENCOUNTER — Emergency Department (HOSPITAL_COMMUNITY): Payer: 59

## 2017-03-20 DIAGNOSIS — K625 Hemorrhage of anus and rectum: Secondary | ICD-10-CM

## 2017-03-20 DIAGNOSIS — I10 Essential (primary) hypertension: Secondary | ICD-10-CM | POA: Diagnosis not present

## 2017-03-20 DIAGNOSIS — J449 Chronic obstructive pulmonary disease, unspecified: Secondary | ICD-10-CM | POA: Insufficient documentation

## 2017-03-20 DIAGNOSIS — Z79899 Other long term (current) drug therapy: Secondary | ICD-10-CM | POA: Insufficient documentation

## 2017-03-20 DIAGNOSIS — R10814 Left lower quadrant abdominal tenderness: Secondary | ICD-10-CM | POA: Insufficient documentation

## 2017-03-20 DIAGNOSIS — Z87891 Personal history of nicotine dependence: Secondary | ICD-10-CM | POA: Insufficient documentation

## 2017-03-20 LAB — CBC WITH DIFFERENTIAL/PLATELET
BASOS ABS: 0 10*3/uL (ref 0.0–0.1)
BASOS PCT: 0 %
EOS PCT: 3 %
Eosinophils Absolute: 0.2 10*3/uL (ref 0.0–0.7)
HCT: 44 % (ref 39.0–52.0)
Hemoglobin: 14.4 g/dL (ref 13.0–17.0)
Lymphocytes Relative: 31 %
Lymphs Abs: 1.6 10*3/uL (ref 0.7–4.0)
MCH: 31.3 pg (ref 26.0–34.0)
MCHC: 32.7 g/dL (ref 30.0–36.0)
MCV: 95.7 fL (ref 78.0–100.0)
Monocytes Absolute: 0.4 10*3/uL (ref 0.1–1.0)
Monocytes Relative: 7 %
Neutro Abs: 3.1 10*3/uL (ref 1.7–7.7)
Neutrophils Relative %: 59 %
PLATELETS: 205 10*3/uL (ref 150–400)
RBC: 4.6 MIL/uL (ref 4.22–5.81)
RDW: 13.8 % (ref 11.5–15.5)
WBC: 5.2 10*3/uL (ref 4.0–10.5)

## 2017-03-20 LAB — COMPREHENSIVE METABOLIC PANEL
ALT: 37 U/L (ref 17–63)
ANION GAP: 11 (ref 5–15)
AST: 33 U/L (ref 15–41)
Albumin: 4.1 g/dL (ref 3.5–5.0)
Alkaline Phosphatase: 34 U/L — ABNORMAL LOW (ref 38–126)
BILIRUBIN TOTAL: 0.9 mg/dL (ref 0.3–1.2)
BUN: 14 mg/dL (ref 6–20)
CALCIUM: 8.9 mg/dL (ref 8.9–10.3)
CO2: 25 mmol/L (ref 22–32)
Chloride: 101 mmol/L (ref 101–111)
Creatinine, Ser: 0.83 mg/dL (ref 0.61–1.24)
GFR calc non Af Amer: >60 mL/min (ref >60)
Glucose, Bld: 123 mg/dL — ABNORMAL HIGH (ref 65–99)
POTASSIUM: 3.8 mmol/L (ref 3.5–5.1)
Sodium: 137 mmol/L (ref 135–145)
TOTAL PROTEIN: 7.2 g/dL (ref 6.5–8.1)

## 2017-03-20 LAB — TYPE AND SCREEN
ABO/RH(D): A NEG
Antibody Screen: NEGATIVE

## 2017-03-20 MED ORDER — IOPAMIDOL (ISOVUE-300) INJECTION 61%
100.0000 mL | Freq: Once | INTRAVENOUS | Status: AC | PRN
  Administered 2017-03-20: 100 mL via INTRAVENOUS

## 2017-03-20 MED ORDER — SODIUM CHLORIDE 0.9 % IV BOLUS (SEPSIS)
500.0000 mL | Freq: Once | INTRAVENOUS | Status: AC
  Administered 2017-03-20: 500 mL via INTRAVENOUS

## 2017-03-20 NOTE — ED Provider Notes (Signed)
[facility] EMERGENCY DEPARTMENT Provider Note   CSN: [id number] Arrival date & time: 03/20/17  1343     History   Chief Complaint Chief Complaint  Patient presents with  . Rectal Bleeding    HPI Daniel [name] is a 62 y.o. male.  Patient states that he has had some rectal bleeding for a few days.  The blood is bright red   The history is provided by the patient.  Rectal Bleeding  Quality:  Bright red Amount:  Moderate Timing:  Intermittent Chronicity:  New Context: not anal fissures   Associated symptoms: no abdominal pain     Past Medical History:  Diagnosis Date  . COPD (chronic obstructive pulmonary disease) ([place])   . Hypertension     Patient Active Problem List   Diagnosis Date Noted  . Rotator cuff tear 04/05/2013  . Shoulder dislocation 04/05/2013  . Acute respiratory failure ([place]) 12/22/2012  . Essential hypertension, benign 12/22/2012  . COPD exacerbation ([place]) 12/21/2012  . Hypertriglyceridemia without hypercholesterolemia 03/12/2011  . Chest pain 03/06/2011  . Anxiety 03/06/2011  . Obesity (BMI 30-39.9) 03/06/2011  . Pre-diabetes 03/06/2011    Past Surgical History:  Procedure Laterality Date  . APPENDECTOMY    . HEMORRHOID SURGERY    . HERNIA REPAIR     RLQ, umbilical       Home Medications    Prior to Admission medications   Medication Sig Start Date End Date Taking? Authorizing Provider  albuterol (PROVENTIL HFA;VENTOLIN HFA) 108 (90 BASE) MCG/ACT inhaler Inhale 2 puffs into the lungs every 4 (four) hours as needed for wheezing or shortness of breath. 12/23/12  Yes Samuella Cota, MD  albuterol (PROVENTIL) (2.5 MG/3ML) 0.083% nebulizer solution Take 2.5 mg by nebulization every 6 (six) hours as needed for wheezing or shortness of breath.   Yes [provider]  albuterol (PROVENTIL) 4 MG tablet Take 4 mg by mouth 4 (four) times daily.   Yes [provider]  amLODipine (NORVASC) 5 MG tablet Take 5 mg by mouth daily.    Yes [provider]  enalapril (VASOTEC) 5 MG tablet Take 5 mg by mouth 2 (two) times daily.   Yes [provider]  furosemide (LASIX) 20 MG tablet Take 1 tablet by mouth 2 (two) times daily. 03/06/13  Yes [provider]  glucosamine-chondroitin 500-400 MG tablet Take 1 tablet by mouth daily.   Yes [provider]  Multiple Vitamin (MULITIVITAMIN WITH MINERALS) TABS Take 1 tablet by mouth daily.   Yes [provider]  Tamsulosin HCl (FLOMAX) 0.4 MG CAPS Take 0.4 mg by mouth daily.   Yes [provider]    Family History Family History  Problem Relation Age of Onset  . Seizures Mother   . Hypertension Mother   . COPD Father     Social History Social History   Tobacco Use  . Smoking status: Former Smoker    Packs/day: 0.30    Years: 40.00    Pack years: 12.00    Types: Cigarettes    Last attempt to quit: 01/28/2013    Years since quitting: 4.1  . Smokeless tobacco: Never Used  Substance Use Topics  . Alcohol use: No  . Drug use: No     Allergies   Patient has no known allergies.   Review of Systems Review of Systems  Constitutional: Negative for appetite change and fatigue.  HENT: Negative for congestion, ear discharge and sinus pressure.   Eyes: Negative for discharge.  Respiratory: Negative for cough.   Cardiovascular: Negative for chest pain.  Gastrointestinal: Positive for hematochezia. Negative for abdominal pain and diarrhea.       Rectal bleeding  Genitourinary: Negative for frequency and hematuria.  Musculoskeletal: Negative for back pain.  Skin: Negative for rash.  Neurological: Negative for seizures and headaches.  Psychiatric/Behavioral: Negative for hallucinations.     Physical Exam Updated Vital Signs BP 129/80   Pulse (!) 56   Temp 98.6 F (37 C) (Oral)   Resp 19   Ht 6\' 2"  (1.88 m)   Wt 118.8 kg (262 lb)   SpO2 97%   BMI 33.64 kg/m   Physical Exam  Constitutional: He is oriented to  person, place, and time. He appears well-developed.  HENT:  Head: Normocephalic.  Eyes: Conjunctivae and EOM are normal. No scleral icterus.  Neck: Neck supple. No thyromegaly present.  Cardiovascular: Normal rate and regular rhythm. Exam reveals no gallop and no friction rub.  No murmur heard. Pulmonary/Chest: No stridor. He has no wheezes. He has no rales. He exhibits no tenderness.  Abdominal: He exhibits no distension. There is tenderness. There is no rebound.  Mild left lower quadrant tenderness  Musculoskeletal: Normal range of motion. He exhibits no edema.  Lymphadenopathy:    He has no cervical adenopathy.  Neurological: He is oriented to person, place, and time. He exhibits normal muscle tone. Coordination normal.  Skin: No rash noted. No erythema.  Psychiatric: He has a normal mood and affect. His behavior is normal.     ED Treatments / Results  Labs (all labs ordered are listed, but only abnormal results are displayed) Labs Reviewed  COMPREHENSIVE METABOLIC PANEL - Abnormal; Notable for the following components:      Result Value   Glucose, Bld 123 (*)    Alkaline Phosphatase 34 (*)    All other components within normal limits  CBC WITH DIFFERENTIAL/PLATELET  POC OCCULT BLOOD, ED  TYPE AND SCREEN    EKG  EKG Interpretation None       Radiology Ct Abdomen Pelvis W Contrast  Result Date: 03/20/2017 CLINICAL DATA:  Hematochezia. EXAM: CT ABDOMEN AND PELVIS WITH CONTRAST TECHNIQUE: Multidetector CT imaging of the abdomen and pelvis was performed using the standard protocol following bolus administration of intravenous contrast. CONTRAST:  147mL ISOVUE-300 IOPAMIDOL (ISOVUE-300) INJECTION 61% COMPARISON:  None. FINDINGS: Lower chest: No acute abnormality. Hepatobiliary: Mild hepatomegaly. No focal abnormality. Unremarkable gallbladder. No biliary dilatation. Pancreas: Unremarkable. No pancreatic ductal dilatation or surrounding inflammatory changes. Spleen: Normal in  size without focal abnormality. Adrenals/Urinary Tract: The adrenal glands are unremarkable. 2.4 cm simple cyst in the left kidney. No renal or ureteral calculi. No hydronephrosis. The bladder is unremarkable for the degree of distention. Stomach/Bowel: Stomach is within normal limits. Appendix is not visualized in this patient with a history of appendectomy. No evidence of bowel wall thickening, distention, or inflammatory changes. Vascular/Lymphatic: Mild aortic atherosclerosis. No enlarged abdominal or pelvic lymph nodes. Reproductive: Prostatomegaly. Other: No free fluid or pneumoperitoneum. Small fat containing inguinal hernia. Musculoskeletal: No acute or significant osseous findings. Degenerative changes of the bilateral sacroiliac joints. IMPRESSION: 1.  No acute intra-abdominal process. 2. Mild hepatomegaly. 3.  Aortic atherosclerosis (ICD10-I70.0). Electronically Signed   By: Titus Dubin M.D.   On: 03/20/2017 17:18    Procedures Procedures (including critical care time)  Medications Ordered in ED Medications  sodium chloride 0.9 % bolus 500 mL (0 mLs Intravenous Stopped 03/20/17 1500)  iopamidol (ISOVUE-300) 61 % injection  100 mL (100 mLs Intravenous Contrast Given 03/20/17 1651)     Initial Impression / Assessment and Plan / ED Course  I have reviewed the triage vital signs and the nursing notes.  Pertinent labs & imaging results that were available during my care of the patient were reviewed by me and considered in my medical decision making (see chart for details).    CT of the abdomen and blood work unremarkable.  Patient will be discharged home and referred to GI.  He is instructed to return if the bleeding becomes heavy again  Final Clinical Impressions(s) / ED Diagnoses   Final diagnoses:  Rectal bleeding    ED Discharge Orders    None       Milton Ferguson, MD 03/20/17 [phone]

## 2017-03-20 NOTE — ED Triage Notes (Signed)
Hx of rectal bleeding. Started again today. Denies sob/pain/v/n/d. Nad. States bright red. A/o. Nad.

## 2017-03-20 NOTE — Discharge Instructions (Signed)
Follow-up with Dr. Oneida Alar or 1 of her partners in the next week.  Return to the emergency department if bleeding gets worse.

## 2017-03-24 ENCOUNTER — Encounter: Payer: Self-pay | Admitting: Gastroenterology

## 2017-04-02 ENCOUNTER — Encounter: Payer: Self-pay | Admitting: *Deleted

## 2017-04-02 ENCOUNTER — Encounter: Payer: Self-pay | Admitting: Nurse Practitioner

## 2017-04-02 ENCOUNTER — Telehealth: Payer: Self-pay | Admitting: *Deleted

## 2017-04-02 ENCOUNTER — Other Ambulatory Visit: Payer: Self-pay | Admitting: *Deleted

## 2017-04-02 ENCOUNTER — Ambulatory Visit: Payer: 59 | Admitting: Nurse Practitioner

## 2017-04-02 DIAGNOSIS — K921 Melena: Secondary | ICD-10-CM | POA: Insufficient documentation

## 2017-04-02 DIAGNOSIS — R195 Other fecal abnormalities: Secondary | ICD-10-CM | POA: Diagnosis not present

## 2017-04-02 MED ORDER — NA SULFATE-K SULFATE-MG SULF 17.5-3.13-1.6 GM/177ML PO SOLN: 1.0000 | ORAL | 0 refills | Status: DC

## 2017-04-02 NOTE — Assessment & Plan Note (Signed)
History of hemorrhoids with "spotting" hematochezia.  He had a hemorrhoidectomy approximately 3 years ago and no bleeding since.  However, 2 weeks ago he had significant bleeding with staining of his underpants.  Later in the day his wife noted blood running down his leg.  He went to the emergency room and his CBC had a normal hemoglobin.  CT abdomen and pelvis was unremarkable.  Recommended follow-up with GI.  Heme positive on Hemoccult.  At this point we will proceed with a colonoscopy to further evaluate.  He has never had a colonoscopy before.  Return for follow-up in 3 months.  Proceed with colonoscopy with Dr. Oneida Alar in the near future. The risks, benefits, and alternatives have been discussed in detail with the patient. They state understanding and desire to proceed.   The patient is not on any anticoagulants, anxiolytics, chronic pain medications, or antidepressants.  Conscious sedation should be adequate for his procedure.

## 2017-04-02 NOTE — Progress Notes (Signed)
CC'ED TO PCP 

## 2017-04-02 NOTE — Patient Instructions (Signed)
1. We will help schedule your colonoscopy for you. 2. Return for follow-up in 3 months. 3. Call if you have any severe bleeding before then. 4. Call if you have any chest pain, worsening shortness of breath, dizziness, lightheadedness, passing out, nearly passing out, rapid heart rate, etc. 5. Further recommendations will be made after your colonoscopy. 6. Call us if you have any questions or concerns.

## 2017-04-02 NOTE — Progress Notes (Addendum)
REVIEWED-NO ADDITIONAL RECOMMENDATIONS.  Primary Care Physician:  Sharilyn Sites, MD Primary Gastroenterologist:  Dr. Oneida Alar  Chief Complaint  Patient presents with  . Rectal Bleeding    Happened about 2 weeks ago, bright red, moderate amount    HPI:   Daniel [name] is a 62 y.o. male who presents referral from primary care for heme positive stool and hematochezia.  He was seen at [facility] occupational in urgent care which noted blood in stool positive Hemoccult.  Recommended referral to gastroenterology.  The patient was also seen in the emergency department for the same on 03/20/2017.  At that time he indicated rectal bleeding for a few days which is bright red, moderate in amount, intermittent.  History of known hemorrhoids status post hemorrhoid treatment.  He also had mild left lower quadrant tenderness.  CBC was essentially normal.  CT of the abdomen found no acute intra-abdominal process, mild hepatomegaly.  No history of colonoscopy in our system.  Today he states he's doing well overall. Has a history of scant toilet tissue hematochezia with hemorrhoidectomy about 3 years ago. About 2 weeks ago had significant rectal bleeding in his underpants with a moderate to large amount of blood. Later that day his wife noted blood running down his leg so he went to the ER. Was told his ER workup was good. Has not had any further bleeding. Has never had a colonoscopy before. Denies any further abdominal pain, N/V, melena, fever, chills, unintentional weight loss. Has lost about 21 lbs on a DASH diet. Denies chest pain, worsening dyspnea (history of COPD), dizziness, lightheadedness, syncope, near syncope. Denies any other upper or lower GI symptoms.  Past Medical History:  Diagnosis Date  . COPD (chronic obstructive pulmonary disease) ([place])   . Hypertension     Past Surgical History:  Procedure Laterality Date  . APPENDECTOMY    . HEMORRHOID SURGERY    . HERNIA REPAIR     RLQ, umbilical     Current Outpatient Medications  Medication Sig Dispense Refill  . albuterol (PROVENTIL HFA;VENTOLIN HFA) 108 (90 BASE) MCG/ACT inhaler Inhale 2 puffs into the lungs every 4 (four) hours as needed for wheezing or shortness of breath. 1 Inhaler 0  . albuterol (PROVENTIL) (2.5 MG/3ML) 0.083% nebulizer solution Take 2.5 mg by nebulization every 6 (six) hours as needed for wheezing or shortness of breath.    [name] albuterol (PROVENTIL) 4 MG tablet Take 4 mg by mouth 4 (four) times daily.    [name] amLODipine (NORVASC) 5 MG tablet Take 5 mg by mouth daily.    . enalapril (VASOTEC) 5 MG tablet Take 5 mg by mouth 2 (two) times daily.    . furosemide (LASIX) 20 MG tablet Take 1 tablet by mouth 2 (two) times daily.    [name] glucosamine-chondroitin 500-400 MG tablet Take 1 tablet by mouth daily.    . Multiple Vitamin (MULITIVITAMIN WITH MINERALS) TABS Take 1 tablet by mouth daily.    . Tamsulosin HCl (FLOMAX) 0.4 MG CAPS Take 0.4 mg by mouth daily.     No current facility-administered medications for this visit.     Allergies as of 04/02/2017  . (No Known Allergies)    Family History  Problem Relation Age of Onset  . Seizures Mother   . Hypertension Mother   . COPD Father   . Colon cancer Neg Hx   . Gastric cancer Neg Hx   . Esophageal cancer Neg Hx     Social History   Socioeconomic History  .  Marital status: Married    Spouse name: Not on file  . Number of children: Not on file  . Years of education: Not on file  . Highest education level: Not on file  Social Needs  . Financial resource strain: Not on file  . Food insecurity - worry: Not on file  . Food insecurity - inability: Not on file  . Transportation needs - medical: Not on file  . Transportation needs - non-medical: Not on file  Occupational History  . Not on file  Tobacco Use  . Smoking status: Former Smoker    Packs/day: 0.30    Years: 40.00    Pack years: 12.00    Types: Cigarettes    Last attempt to quit: 01/28/2013     Years since quitting: 4.1  . Smokeless tobacco: Never Used  Substance and Sexual Activity  . Alcohol use: No  . Drug use: No  . Sexual activity: Not on file  Other Topics Concern  . Not on file  Social History Narrative  . Not on file    Review of Systems: Complete ROS negative except as per HPI.    Physical Exam: BP 137/79   Pulse (!) 57   Temp (!) 97.4 F (36.3 C) (Oral)   Ht 6\' 2"  (1.88 m)   Wt 273 lb 12.8 oz (124.2 kg)   BMI 35.15 kg/m  General:   Alert and oriented. Pleasant and cooperative. Well-nourished and well-developed.  Eyes:  Without icterus, sclera clear and conjunctiva pink.  Ears:  Normal auditory acuity. Cardiovascular:  S1, S2 present without murmurs appreciated. Extremities without clubbing or edema. Respiratory:  Clear to auscultation bilaterally. No wheezes, rales, or rhonchi. No distress.  Gastrointestinal:  +BS, rounded but soft, non-tender and non-distended. No HSM noted. No guarding or rebound. No masses appreciated.  Rectal:  Deferred  Musculoskalatal:  Symmetrical without gross deformities. Neurologic:  Alert and oriented x4;  grossly normal neurologically. Psych:  Alert and cooperative. Normal mood and affect. Heme/Lymph/Immune: No excessive bruising noted.    04/02/2017 3:18 PM   Disclaimer: This note was dictated with voice recognition software. Similar sounding words can inadvertently be transcribed and may not be corrected upon review.

## 2017-04-02 NOTE — Assessment & Plan Note (Signed)
Heme positive stool noted on rectal exam at urgent care when he first noted rectal bleeding.  This cooperates with his hematochezia as per below.  We will proceed with colonoscopy as per below.

## 2017-04-02 NOTE — Telephone Encounter (Signed)
PA for TCS was approved via [facility] website. Auth # [id number]

## 2017-04-03 ENCOUNTER — Ambulatory Visit: Admit: 2017-04-03 | Discharge: 2017-04-16 | Payer: PRIVATE HEALTH INSURANCE

## 2017-04-03 DIAGNOSIS — Z1211 Encounter for screening for malignant neoplasm of colon: Secondary | ICD-10-CM

## 2017-04-03 NOTE — Unmapped (Signed)
Chief Complaint   Patient presents with   ??? New Patient Visit/ Consultation        History of Present Illness  Mr. Nicholl is here for a colonoscopy. He seems to have done a bowel prep but is clearly in the wrong location and does not know where he is supposed to be.         Review of Systems   Constitutional: Negative for activity change, appetite change, chills, diaphoresis, fatigue, fever, weight gain and weight loss.   HENT: Negative for congestion, dental problem, ear discharge, ear pain, facial swelling, mouth sores, rhinorrhea, sinus pressure, tinnitus, trouble swallowing and voice change.    Eyes: Negative for photophobia, pain, discharge, redness, itching and visual disturbance.   Respiratory: Negative for apnea, cough, choking, chest tightness, shortness of breath and wheezing.    Cardiovascular: Negative for chest pain, palpitations and leg swelling.   Gastrointestinal: Negative for abdominal distention, abdominal pain, bloating, blood in stool, constipation, heartburn, nausea and vomiting.   Genitourinary: Negative for difficulty urinating, flank pain, frequency, hematuria, penile pain, penile swelling, scrotal swelling, testicular pain and urgency.   Musculoskeletal: Negative for arthralgias, back pain, gait problem, joint swelling, myalgias, neck pain and neck stiffness.   Skin: Negative for pallor, rash and wound.   Neurological: Negative for dizziness, seizures, syncope, speech difficulty, weakness, numbness and headaches.   Hematological: Negative for adenopathy. Does not bruise/bleed easily.   Psychiatric/Behavioral: Negative for agitation, confusion, decreased concentration, depression, hallucinations, self-injury, sleep disturbance and suicidal ideas. The patient is not nervous/anxious and is not hyperactive.    All other systems reviewed and are negative.      Allergies  Penicillins    Medications  Outpatient Encounter Prescriptions as of 04/03/2017   Medication Sig Dispense Refill   ??? ALBUTEROL  INHL Inhale into the lungs.     ??? amLODIPine (NORVASC) 10 MG tablet Take 10 mg by mouth daily     ??? aspirin 81 MG EC tablet Take 1 tablet (81 mg total) by mouth daily. 30 tablet 0   ??? atorvastatin (LIPITOR) 80 MG tablet Take 1 tablet (80 mg total) by mouth at bedtime. 30 tablet 0   ??? blood sugar diagnostic Strp Test daily     ??? blood-glucose meter Misc by Does not apply route.     ??? buPROPion SR (WELLBUTRIN SR) 100 MG tablet Take 100 mg by mouth 2 times daily Ran out of prescription     ??? entecavir (BARACLUDE) 0.5 MG tablet Take 1 tablet (0.5 mg total) by mouth daily. 30 tablet 2   ??? hydroCHLOROthiazide (MICROZIDE) 12.5 mg capsule Take 1 capsule (12.5 mg total) by mouth daily. 30 capsule 3   ??? ibuprofen (ADVIL,MOTRIN) 800 MG tablet Take 0.5 tablets (400 mg total) by mouth every 6 hours as needed for up to 16 doses. 16 tablet 0   ??? ketoconazole (NIZORAL) 2 % cream      ??? loratadine 10 mg Cap Take 1 tablet by mouth daily     ??? losartan (COZAAR) 100 MG tablet Take 1 tablet (100 mg total) by mouth daily. Take 100 mg by mouth daily 30 tablet 0   ??? metFORMIN (GLUCOPHAGE) 500 MG tablet Take 500 mg by mouth 2 times a day with meals.     ??? ondansetron (ZOFRAN ODT) 4 MG disintegrating tablet Take 1 tablet (4 mg total) by mouth every 6 hours as needed for Nausea. 16 tablet 0   ??? orphenadrine (NORFLEX) 100 mg tablet Take  1 tablet (100 mg total) by mouth 2 times a day. 10 tablet 0   ??? oxyCODONE-acetaminophen (PERCOCET) 10-325 mg per tablet 1 tablet every 4 hours as needed.        ??? tamsulosin (FLOMAX) 0.4 mg Cp24 Take 0.4 mg by mouth daily     ??? traZODone (DESYREL) 100 MG tablet      ??? FLUoxetine (PROZAC) 20 MG capsule      ??? oxyCODONE (ROXICODONE) 5 MG immediate release tablet Take 1 tablet (5 mg total) by mouth every 4 hours as needed for Pain. 30 tablet 0     No facility-administered encounter medications on file as of 04/03/2017.         Histories  He has a past medical history of COPD (chronic obstructive pulmonary disease)  (CMS Dx); Diabetes mellitus (CMS Dx); Dysphagia; GERD (gastroesophageal reflux disease); Hepatitis; and Hypertension.    He has a past surgical history that includes Foot surgery (Left); Esophagogastroduodenoscopy (N/A, 11/19/2013); and Hernia repair.    His family history is not on file.    He reports that he has been smoking Cigarettes.  He has a 1.25 pack-year smoking history. He has never used smokeless tobacco. He reports that he does not drink alcohol or use drugs.    The following portions of the patient's history were reviewed and updated as appropriate: allergies, current medications, past family history, past medical history, past social history, past surgical history, problem list and review of systems.    Pulse 70, height 5' 11 (1.803 m), weight 180 lb (81.6 kg), SpO2 96 %.  Physical Exam       Assessment  Incorrectly scheduled appointment    Plan  No charge for this visit.       Medical Decision Making  The following items were considered in medical decision making:       Referring MD: Referral, Self    Patient Care Team:  Hendricks Limes Doriott, DO as PCP - General

## 2017-04-15 DIAGNOSIS — Z1211 Encounter for screening for malignant neoplasm of colon: Secondary | ICD-10-CM

## 2017-04-24 ENCOUNTER — Encounter (HOSPITAL_COMMUNITY)
Admission: RE | Disposition: A | Discharge status: Home / Self Care | Payer: Self-pay | Source: Ambulatory Visit | Attending: Gastroenterology

## 2017-04-24 ENCOUNTER — Ambulatory Visit (HOSPITAL_COMMUNITY)
Admission: RE | Admit: 2017-04-24 | Discharge: 2017-04-24 | Disposition: A | Discharge status: Home / Self Care | Payer: 59 | Source: Ambulatory Visit | Attending: Gastroenterology | Admitting: Gastroenterology

## 2017-04-24 ENCOUNTER — Encounter (HOSPITAL_COMMUNITY): Payer: Self-pay | Admitting: *Deleted

## 2017-04-24 ENCOUNTER — Other Ambulatory Visit: Payer: Self-pay

## 2017-04-24 DIAGNOSIS — Z87891 Personal history of nicotine dependence: Secondary | ICD-10-CM | POA: Diagnosis not present

## 2017-04-24 DIAGNOSIS — J449 Chronic obstructive pulmonary disease, unspecified: Secondary | ICD-10-CM | POA: Insufficient documentation

## 2017-04-24 DIAGNOSIS — I10 Essential (primary) hypertension: Secondary | ICD-10-CM | POA: Insufficient documentation

## 2017-04-24 DIAGNOSIS — K648 Other hemorrhoids: Secondary | ICD-10-CM | POA: Diagnosis not present

## 2017-04-24 DIAGNOSIS — Z79899 Other long term (current) drug therapy: Secondary | ICD-10-CM | POA: Diagnosis not present

## 2017-04-24 DIAGNOSIS — K644 Residual hemorrhoidal skin tags: Secondary | ICD-10-CM | POA: Diagnosis not present

## 2017-04-24 DIAGNOSIS — R195 Other fecal abnormalities: Secondary | ICD-10-CM

## 2017-04-24 DIAGNOSIS — K921 Melena: Secondary | ICD-10-CM | POA: Diagnosis not present

## 2017-04-24 DIAGNOSIS — Q438 Other specified congenital malformations of intestine: Secondary | ICD-10-CM | POA: Insufficient documentation

## 2017-04-24 HISTORY — PX: COLONOSCOPY: SHX5424

## 2017-04-24 SURGERY — COLONOSCOPY
Anesthesia: Moderate Sedation

## 2017-04-24 MED ORDER — SODIUM CHLORIDE 0.9 % IV SOLN
INTRAVENOUS | Status: DC
  Administered 2017-04-24: 13:00:00 via INTRAVENOUS

## 2017-04-24 MED ORDER — MEPERIDINE HCL 100 MG/ML IJ SOLN
INTRAMUSCULAR | Status: DC | PRN
  Administered 2017-04-24 (×3): 25 mg via INTRAVENOUS

## 2017-04-24 MED ORDER — MEPERIDINE HCL 100 MG/ML IJ SOLN
INTRAMUSCULAR | Status: AC
  Filled 2017-04-24: qty 2

## 2017-04-24 MED ORDER — MIDAZOLAM HCL 5 MG/5ML IJ SOLN
INTRAMUSCULAR | Status: DC | PRN
  Administered 2017-04-24: 1 mg via INTRAVENOUS
  Administered 2017-04-24 (×2): 2 mg via INTRAVENOUS

## 2017-04-24 MED ORDER — MIDAZOLAM HCL 5 MG/5ML IJ SOLN
INTRAMUSCULAR | Status: AC
  Filled 2017-04-24: qty 10

## 2017-04-24 MED ORDER — STERILE WATER FOR IRRIGATION IR SOLN
Status: DC | PRN
  Administered 2017-04-24: 13:00:00

## 2017-04-24 NOTE — Progress Notes (Signed)
Daniel [name] cannot drive, operate heavy machinery, or sign legal documents until after 2:00pm on Friday March 29th, 2019.

## 2017-04-24 NOTE — Discharge Instructions (Signed)
You have moderate size EXTERNAL AND SMALL internal hemorrhoids, WHICH CAN CAUSE INTERMITTENT RECTAL BLEEDING. YOU DID NOT HAVE ANY POLYPS.   DRINK WATER TO KEEP YOUR URINE LIGHT YELLOW.  CONTINUE YOUR WEIGHT LOSS EFFORTS.  WHILE I DO NOT WANT TO ALARM YOU, YOUR BODY MASS INDEX IS OVER 30 WHICH MEANS YOU ARE OBESE. OBESITY IS ASSOCIATED WITH AN INCREASED RISK FOR CIRRHOSIS AND ALL CANCERS, INCLUDING ESOPHAGEAL AND COLON CANCER. A WEIGHT OF 230 LBS OR LESS WILL KEEP YOUR BODY MASS INDEX(BMI) UNDER 30.  FOLLOW A HIGH FIBER DIET. AVOID ITEMS THAT CAUSE BLOATING. SEE INFO BELOW.   USE PREPARATION H FOUR TIMES  A DAY IF NEEDED TO RELIEVE RECTAL PAIN/PRESSURE/BLEEDING.  PLEASE CALL WITH QUESTIONS OR CONCERNS.   FOLLOW UP IN 6 MOS.   Next colonoscopy in 10 years.  Colonoscopy Care After Read the instructions outlined below and refer to this sheet in the next week. These discharge instructions provide you with general information on caring for yourself after you leave the hospital. While your treatment has been planned according to the most current medical practices available, unavoidable complications occasionally occur. If you have any problems or questions after discharge, call DR. Watt Geiler, [phone].  ACTIVITY  You may resume your regular activity, but move at a slower pace for the next 24 hours.   Take frequent rest periods for the next 24 hours.   Walking will help get rid of the air and reduce the bloated feeling in your belly (abdomen).   No driving for 24 hours (because of the medicine (anesthesia) used during the test).   You may shower.   Do not sign any important legal documents or operate any machinery for 24 hours (because of the anesthesia used during the test).    NUTRITION  Drink plenty of fluids.   You may resume your normal diet as instructed by your doctor.   Begin with a light meal and progress to your normal diet. Heavy or fried foods are harder to digest  and may make you feel sick to your stomach (nauseated).   Avoid alcoholic beverages for 24 hours or as instructed.    MEDICATIONS  You may resume your normal medications.   WHAT YOU CAN EXPECT TODAY  Some feelings of bloating in the abdomen.   Passage of more gas than usual.   Spotting of blood in your stool or on the toilet paper  .  IF YOU HAD POLYPS REMOVED DURING THE COLONOSCOPY:  Eat a soft diet IF YOU HAVE NAUSEA, BLOATING, ABDOMINAL PAIN, OR VOMITING.    FINDING OUT THE RESULTS OF YOUR TEST Not all test results are available during your visit. DR. Oneida Alar WILL CALL YOU WITHIN 14 DAYS OF YOUR PROCEDUE WITH YOUR RESULTS. Do not assume everything is normal if you have not heard from DR. Sharlisa Hollifield, CALL HER OFFICE AT [phone].  SEEK IMMEDIATE MEDICAL ATTENTION AND CALL THE OFFICE: [phone] IF:  You have more than a spotting of blood in your stool.   Your belly is swollen (abdominal distention).   You are nauseated or vomiting.   You have a temperature over 101F.   You have abdominal pain or discomfort that is severe or gets worse throughout the day.  High-Fiber Diet A high-fiber diet changes your normal diet to include more whole grains, legumes, fruits, and vegetables. Changes in the diet involve replacing refined carbohydrates with unrefined foods. The calorie level of the diet is essentially unchanged. The Dietary Reference Intake (recommended amount) for  adult males is 38 grams per day. For adult females, it is 25 grams per day. Pregnant and lactating women should consume 28 grams of fiber per day. Fiber is the intact part of a plant that is not broken down during digestion. Functional fiber is fiber that has been isolated from the plant to provide a beneficial effect in the body. PURPOSE  Increase stool bulk.   Ease and regulate bowel movements.   Lower cholesterol.   REDUCE RISK OF COLON CANCER  INDICATIONS THAT YOU NEED MORE FIBER  Constipation and  hemorrhoids.   Uncomplicated diverticulosis (intestine condition) and irritable bowel syndrome.   Weight management.   As a protective measure against hardening of the arteries (atherosclerosis), diabetes, and cancer.   GUIDELINES FOR INCREASING FIBER IN THE DIET  Start adding fiber to the diet slowly. A gradual increase of about 5 more grams (2 slices of whole-wheat bread, 2 servings of most fruits or vegetables, or 1 bowl of high-fiber cereal) per day is best. Too rapid an increase in fiber may result in constipation, flatulence, and bloating.   Drink enough water and fluids to keep your urine clear or pale yellow. Water, juice, or caffeine-free drinks are recommended. Not drinking enough fluid may cause constipation.   Eat a variety of high-fiber foods rather than one type of fiber.   Try to increase your intake of fiber through using high-fiber foods rather than fiber pills or supplements that contain small amounts of fiber.   The goal is to change the types of food eaten. Do not supplement your present diet with high-fiber foods, but replace foods in your present diet.    INCLUDE A VARIETY OF FIBER SOURCES  Replace refined and processed grains with whole grains, canned fruits with fresh fruits, and incorporate other fiber sources. White rice, white breads, and most bakery goods contain little or no fiber.   Brown whole-grain rice, buckwheat oats, and many fruits and vegetables are all good sources of fiber. These include: broccoli, Brussels sprouts, cabbage, cauliflower, beets, sweet potatoes, white potatoes (skin on), carrots, tomatoes, eggplant, squash, berries, fresh fruits, and dried fruits.   Cereals appear to be the richest source of fiber. Cereal fiber is found in whole grains and bran. Bran is the fiber-rich outer coat of cereal grain, which is largely removed in refining. In whole-grain cereals, the bran remains. In breakfast cereals, the largest amount of fiber is found in  those with "bran" in their names. The fiber content is sometimes indicated on the label.   You may need to include additional fruits and vegetables each day.   In baking, for 1 cup white flour, you may use the following substitutions:   1 cup whole-wheat flour minus 2 tablespoons.   1/2 cup white flour plus 1/2 cup whole-wheat flour.   Hemorrhoids Hemorrhoids are dilated (enlarged) veins around the rectum. Sometimes clots will form in the veins. This makes them swollen and painful. These are called thrombosed hemorrhoids. Causes of hemorrhoids include:  Constipation.   Straining to have a bowel movement.   HEAVY LIFTING  HOME CARE INSTRUCTIONS  Eat a well balanced diet and drink 6 to 8 glasses of water every day to avoid constipation. You may also use a bulk laxative.   Avoid straining to have bowel movements.   Keep anal area dry and clean.   Do not use a donut shaped pillow or sit on the toilet for long periods. This increases blood pooling and pain.   Move  your bowels when your body has the urge; this will require less straining and will decrease pain and pressure.

## 2017-04-24 NOTE — Op Note (Signed)
[facility] Patient Name: Daniel [name] Procedure Date: 04/24/2017 1:17 PM MRN: [id number] Date of Birth: 10/12/1955 Attending MD: Barney Drain MD, MD CSN: [id number] Age: 61 Admit Type: Outpatient Procedure:                Colonoscopy, DIAGNOSTIC Indications:              Hematochezia Providers:                Barney Drain MD, MD, Otis Peak B. Sharon Seller, RN, Hinton Rao, RN Referring MD:             Halford Chessman MD, MD Medicines:                Meperidine 75 mg IV, Midazolam 5 mg IV Complications:            No immediate complications. Estimated Blood Loss:     Estimated blood loss: none. Procedure:                Pre-Anesthesia Assessment:                           - Prior to the procedure, a History and Physical                            was performed, and patient medications and                            allergies were reviewed. The patient's tolerance of                            previous anesthesia was also reviewed. The risks                            and benefits of the procedure and the sedation                            options and risks were discussed with the patient.                            All questions were answered, and informed consent                            was obtained. Prior Anticoagulants: The patient has                            taken aspirin, last dose was 1 day prior to                            procedure. ASA Grade Assessment: II - A patient                            with mild systemic disease. After reviewing the  risks and benefits, the patient was deemed in                            satisfactory condition to undergo the procedure.                            After obtaining informed consent, the colonoscope                            was passed under direct vision. Throughout the                            procedure, the patient's blood pressure, pulse, and   oxygen saturations were monitored continuously. The                            EC-[id number] ([id number]) scope was introduced through                            the anus and advanced to the 5 cm into the ileum.                            The colonoscopy was somewhat difficult due to poor                            endoscopic visualization. Successful completion of                            the procedure was aided by lavage. The patient                            tolerated the procedure well. The terminal ileum,                            ileocecal valve, appendiceal orifice, and rectum                            were photographed. The quality of the bowel                            preparation was good. Scope In: 1:37:37 PM Scope Out: 1:50:13 PM Scope Withdrawal Time: 0 hours 11 minutes 12 seconds  Total Procedure Duration: 0 hours 12 minutes 36 seconds  Findings:      The terminal ileum appeared normal.      The recto-sigmoid colon and sigmoid colon were mildly tortuous.      Internal hemorrhoids were found during retroflexion. The hemorrhoids       were small.      External hemorrhoids were found during retroflexion. The hemorrhoids       were moderate. Impression:               - The examined portion of the ileum was normal.                           -  SLIGHTLY Tortuous LEFT colon.                           - Rectal bleeding DUE TO Internal hemorrhoids.                           - External hemorrhoids. Moderate Sedation:      Moderate (conscious) sedation was administered by the endoscopy nurse       and supervised by the endoscopist. The following parameters were       monitored: oxygen saturation, heart rate, blood pressure, and response       to care. Total physician intraservice time was 33 minutes. Recommendation:           - Repeat colonoscopy in 10 years for surveillance.                           - High fiber diet.                           - Continue present medications. USE  PREPARARTION H                            QID FOR HEMORRHOIDAL BLEEDING.                           - Patient has a contact number available for                            emergencies. The signs and symptoms of potential                            delayed complications were discussed with the                            patient. Return to normal activities tomorrow.                            Written discharge instructions were provided to the                            patient. Procedure Code(s):        --- Professional ---                           [phone], Colonoscopy, flexible; diagnostic, including                            collection of specimen(s) by brushing or washing,                            when performed (separate procedure)                           99152, Moderate sedation services provided by the  same physician or other qualified health care                            professional performing the diagnostic or                            therapeutic service that the sedation supports,                            requiring the presence of an independent trained                            observer to assist in the monitoring of the                            patient's level of consciousness and physiological                            status; initial 15 minutes of intraservice time,                            patient age 74 years or older                           [phone], Moderate sedation services; each additional                            15 minutes intraservice time Diagnosis Code(s):        --- Professional ---                           K64.4, Residual hemorrhoidal skin tags                           K64.8, Other hemorrhoids                           K92.1, Melena (includes Hematochezia)                           Q43.8, Other specified congenital malformations of                            intestine CPT copyright 2016 American [employer] Association.  All rights reserved. The codes documented in this report are preliminary and upon coder review may  be revised to meet current compliance requirements. Barney Drain, MD Barney Drain MD, MD 04/24/2017 2:01:02 PM This report has been signed electronically. Number of Addenda: 0

## 2017-04-24 NOTE — H&P (Signed)
Primary Care Physician:  Sharilyn Sites, MD Primary Gastroenterologist:  Dr. Oneida Alar  Pre-Procedure History & Physical: HPI:  Daniel [name] is a 62 y.o. male here for  BRBPR.  Past Medical History:  Diagnosis Date  . COPD (chronic obstructive pulmonary disease) ([place])   . Hypertension     Past Surgical History:  Procedure Laterality Date  . APPENDECTOMY    . HEMORRHOID SURGERY    . HERNIA REPAIR     RLQ, umbilical    Prior to Admission medications   Medication Sig Start Date End Date Taking? Authorizing Provider  acetaminophen (TYLENOL) 500 MG tablet Take 1,000 mg by mouth daily as needed for moderate pain or headache.   Yes [provider]  albuterol (PROVENTIL HFA;VENTOLIN HFA) 108 (90 BASE) MCG/ACT inhaler Inhale 2 puffs into the lungs every 4 (four) hours as needed for wheezing or shortness of breath. 12/23/12  Yes Samuella Cota, MD  albuterol (PROVENTIL) (2.5 MG/3ML) 0.083% nebulizer solution Take 2.5 mg by nebulization every 6 (six) hours as needed for wheezing or shortness of breath.   Yes [provider]  albuterol (PROVENTIL) 4 MG tablet Take 4 mg by mouth 4 (four) times daily.   Yes [provider]  amLODipine (NORVASC) 5 MG tablet Take 5 mg by mouth daily.   Yes [provider]  enalapril (VASOTEC) 5 MG tablet Take 5 mg by mouth 2 (two) times daily.   Yes [provider]  furosemide (LASIX) 20 MG tablet Take 20 mg by mouth daily as needed for edema.  03/06/13  Yes [provider]  Glucosamine-Chondroitin (OSTEO BI-FLEX REGULAR STRENGTH PO) Take 1 tablet by mouth daily.   Yes [provider]  ibuprofen (ADVIL,MOTRIN) 200 MG tablet Take 400 mg by mouth daily as needed for headache or moderate pain.   Yes [provider]  Multiple Vitamin (MULITIVITAMIN WITH MINERALS) TABS Take 1 tablet by mouth daily.   Yes [provider]  Na Sulfate-K Sulfate-Mg Sulf 17.5-3.13-1.6 GM/177ML SOLN Take 1 kit by  mouth as directed. 04/02/17  Yes Andyn Sales L, MD  Tamsulosin HCl (FLOMAX) 0.4 MG CAPS Take 0.4 mg by mouth at bedtime.    Yes [provider]    Allergies as of 04/02/2017  . (No Known Allergies)    Family History  Problem Relation Age of Onset  . Seizures Mother   . Hypertension Mother   . COPD Father   . Colon cancer Neg Hx   . Gastric cancer Neg Hx   . Esophageal cancer Neg Hx     Social History   Socioeconomic History  . Marital status: Married    Spouse name: Not on file  . Number of children: Not on file  . Years of education: Not on file  . Highest education level: Not on file  Occupational History  . Not on file  Social Needs  . Financial resource strain: Not on file  . Food insecurity:    Worry: Not on file    Inability: Not on file  . Transportation needs:    Medical: Not on file    Non-medical: Not on file  Tobacco Use  . Smoking status: Former Smoker    Packs/day: 0.30    Years: 40.00    Pack years: 12.00    Types: Cigarettes    Last attempt to quit: 01/28/2013    Years since quitting: 4.2  . Smokeless tobacco: Never Used  Substance and Sexual Activity  . Alcohol use:  No  . Drug use: No  . Sexual activity: Not on file  Lifestyle  . Physical activity:    Days per week: Not on file    Minutes per session: Not on file  . Stress: Not on file  Relationships  . Social connections:    Talks on phone: Not on file    Gets together: Not on file    Attends religious service: Not on file    Active member of club or organization: Not on file    Attends meetings of clubs or organizations: Not on file    Relationship status: Not on file  . Intimate partner violence:    Fear of current or ex partner: Not on file    Emotionally abused: Not on file    Physically abused: Not on file    Forced sexual activity: Not on file  Other Topics Concern  . Not on file  Social History Narrative  . Not on file    Review of Systems: See HPI, otherwise  negative ROS   Physical Exam: There were no vitals taken for this visit. General:   Alert,  pleasant and cooperative in NAD Head:  Normocephalic and atraumatic. Neck:  Supple; Lungs:  Clear throughout to auscultation.    Heart:  Regular rate and rhythm. Abdomen:  Soft, nontender and nondistended. Normal bowel sounds, without guarding, and without rebound.   Neurologic:  Alert and  oriented x4;  grossly normal neurologically.  Impression/Plan:    BRBPR  PLAN: TCS/possible hemorrhoid banding TODAY DISCUSSED PROCEDURE, BENEFITS, & RISKS: < 1% chance of medication reaction, bleeding, perforation, PELVIC VEIN SEPSIS, or rupture of spleen/liver.

## 2017-04-29 ENCOUNTER — Encounter (HOSPITAL_COMMUNITY): Payer: Self-pay | Admitting: Gastroenterology

## 2017-06-25 ENCOUNTER — Ambulatory Visit: Payer: PRIVATE HEALTH INSURANCE | Attending: Family

## 2017-07-07 ENCOUNTER — Encounter: Payer: Self-pay | Admitting: Gastroenterology

## 2017-07-07 ENCOUNTER — Ambulatory Visit: Payer: 59 | Admitting: Nurse Practitioner

## 2017-07-07 ENCOUNTER — Telehealth: Payer: Self-pay | Admitting: Gastroenterology

## 2017-07-07 NOTE — Progress Notes (Deleted)
Referring Provider: Sharilyn Sites, MD Primary Care Physician:  Sharilyn Sites, MD Primary GI:  Dr. Oneida Alar  No chief complaint on file.   HPI:   Daniel [name] is a 62 y.o. male who presents for follow-up on hemorrhoids.  The patient was last seen in our office 04/02/2017 for hematochezia and heme positive stool.  Her to his last visit he was seen positive at urgent care.  He noted hematochezia.  In the emergency department 03/20/2017 CBC and CT of the abdomen were essentially normal.  History of known hemorrhoids status post hemorrhoid treatment.  At his last visit he noted scant toilet tissue hematochezia with hemorrhoidectomy status post 3 years ago.  2 weeks ago began having more significant bleeding deemed moderate to large amount.  Blood was running down his leg.  That was when he reported to the emergency department.  No further bleeding, never had a colonoscopy before.  Has lost about 21 pounds intentionally on a DASH diet.  No other GI symptoms.  Recommended colonoscopy, follow-up in 3 months, notify us of any worsening bleeding or anemia symptoms (reviewed with the patient).  Colonoscopy completed 04/24/2017 which found normal ileum, slightly tortuous left colon, rectal bleeding due to internal hemorrhoids, noted external hemorrhoids.  Recommended high-fiber diet, continue current medications, Preparation H 4 times a day for hemorrhoid bleeding, repeat colonoscopy in 10 years.  Today he states   Past Medical History:  Diagnosis Date  . COPD (chronic obstructive pulmonary disease) ([place])   . Hypertension     Past Surgical History:  Procedure Laterality Date  . APPENDECTOMY    . COLONOSCOPY N/A 04/24/2017   Procedure: COLONOSCOPY;  Surgeon: Danie Binder, MD;  Location: [facility];  Service: Endoscopy;  Laterality: N/A;  1:00pm  . HEMORRHOID SURGERY    . HERNIA REPAIR     RLQ, umbilical    Current Outpatient Medications  Medication Sig Dispense Refill  . acetaminophen  (TYLENOL) 500 MG tablet Take 1,000 mg by mouth daily as needed for moderate pain or headache.    . albuterol (PROVENTIL HFA;VENTOLIN HFA) 108 (90 BASE) MCG/ACT inhaler Inhale 2 puffs into the lungs every 4 (four) hours as needed for wheezing or shortness of breath. 1 Inhaler 0  . albuterol (PROVENTIL) (2.5 MG/3ML) 0.083% nebulizer solution Take 2.5 mg by nebulization every 6 (six) hours as needed for wheezing or shortness of breath.    [name] albuterol (PROVENTIL) 4 MG tablet Take 4 mg by mouth 4 (four) times daily.    [name] amLODipine (NORVASC) 5 MG tablet Take 5 mg by mouth daily.    . enalapril (VASOTEC) 5 MG tablet Take 5 mg by mouth 2 (two) times daily.    . furosemide (LASIX) 20 MG tablet Take 20 mg by mouth daily as needed for edema.     . Glucosamine-Chondroitin (OSTEO BI-FLEX REGULAR STRENGTH PO) Take 1 tablet by mouth daily.    [name] ibuprofen (ADVIL,MOTRIN) 200 MG tablet Take 400 mg by mouth daily as needed for headache or moderate pain.    . Multiple Vitamin (MULITIVITAMIN WITH MINERALS) TABS Take 1 tablet by mouth daily.    . Tamsulosin HCl (FLOMAX) 0.4 MG CAPS Take 0.4 mg by mouth at bedtime.      No current facility-administered medications for this visit.     Allergies as of 07/07/2017  . (No Known Allergies)    Family History  Problem Relation Age of Onset  . Seizures Mother   . Hypertension Mother   .  COPD Father   . Colon cancer Neg Hx   . Gastric cancer Neg Hx   . Esophageal cancer Neg Hx     Social History   Socioeconomic History  . Marital status: Married    Spouse name: Not on file  . Number of children: Not on file  . Years of education: Not on file  . Highest education level: Not on file  Occupational History  . Not on file  Social Needs  . Financial resource strain: Not on file  . Food insecurity:    Worry: Not on file    Inability: Not on file  . Transportation needs:    Medical: Not on file    Non-medical: Not on file  Tobacco Use  . Smoking status:  Former Smoker    Packs/day: 0.30    Years: 40.00    Pack years: 12.00    Types: Cigarettes    Last attempt to quit: 01/28/2013    Years since quitting: 4.4  . Smokeless tobacco: Never Used  Substance and Sexual Activity  . Alcohol use: No  . Drug use: No  . Sexual activity: Not on file  Lifestyle  . Physical activity:    Days per week: Not on file    Minutes per session: Not on file  . Stress: Not on file  Relationships  . Social connections:    Talks on phone: Not on file    Gets together: Not on file    Attends religious service: Not on file    Active member of club or organization: Not on file    Attends meetings of clubs or organizations: Not on file    Relationship status: Not on file  Other Topics Concern  . Not on file  Social History Narrative  . Not on file    Review of Systems: General: Negative for anorexia, weight loss, fever, chills, fatigue, weakness. Eyes: Negative for vision changes.  ENT: Negative for hoarseness, difficulty swallowing , nasal congestion. CV: Negative for chest pain, angina, palpitations, dyspnea on exertion, peripheral edema.  Respiratory: Negative for dyspnea at rest, dyspnea on exertion, cough, sputum, wheezing.  GI: See history of present illness. GU:  Negative for dysuria, hematuria, urinary incontinence, urinary frequency, nocturnal urination.  MS: Negative for joint pain, low back pain.  Derm: Negative for rash or itching.  Neuro: Negative for weakness, abnormal sensation, seizure, frequent headaches, memory loss, confusion.  Psych: Negative for anxiety, depression, suicidal ideation, hallucinations.  Endo: Negative for unusual weight change.  Heme: Negative for bruising or bleeding. Allergy: Negative for rash or hives.   Physical Exam: There were no vitals taken for this visit. General:   Alert and oriented. Pleasant and cooperative. Well-nourished and well-developed.  Head:  Normocephalic and atraumatic. Eyes:  Without  icterus, sclera clear and conjunctiva pink.  Ears:  Normal auditory acuity. Mouth:  No deformity or lesions, oral mucosa pink.  Throat/Neck:  Supple, without mass or thyromegaly. Cardiovascular:  S1, S2 present without murmurs appreciated. Normal pulses noted. Extremities without clubbing or edema. Respiratory:  Clear to auscultation bilaterally. No wheezes, rales, or rhonchi. No distress.  Gastrointestinal:  +BS, soft, non-tender and non-distended. No HSM noted. No guarding or rebound. No masses appreciated.  Rectal:  Deferred  Musculoskalatal:  Symmetrical without gross deformities. Normal posture. Skin:  Intact without significant lesions or rashes. Neurologic:  Alert and oriented x4;  grossly normal neurologically. Psych:  Alert and cooperative. Normal mood and affect. Heme/Lymph/Immune: No significant cervical adenopathy. No  excessive bruising noted.    07/07/2017 9:33 AM   Disclaimer: This note was dictated with voice recognition software. Similar sounding words can inadvertently be transcribed and may not be corrected upon review.

## 2017-07-07 NOTE — Telephone Encounter (Signed)
PATIENT WAS A NO SHOW AND LETTER SENT  °

## 2017-07-08 NOTE — Telephone Encounter (Signed)
Noted  

## 2017-07-14 ENCOUNTER — Encounter: Attending: Family

## 2017-07-24 DIAGNOSIS — Z1389 Encounter for screening for other disorder: Secondary | ICD-10-CM | POA: Diagnosis not present

## 2017-07-24 DIAGNOSIS — R319 Hematuria, unspecified: Secondary | ICD-10-CM | POA: Diagnosis not present

## 2017-07-24 DIAGNOSIS — Z6836 Body mass index (BMI) 36.0-36.9, adult: Secondary | ICD-10-CM | POA: Diagnosis not present

## 2017-07-24 DIAGNOSIS — E6609 Other obesity due to excess calories: Secondary | ICD-10-CM | POA: Diagnosis not present

## 2017-09-17 ENCOUNTER — Inpatient Hospital Stay: Payer: PRIVATE HEALTH INSURANCE | Primary: Family Medicine

## 2017-09-22 ENCOUNTER — Encounter: Attending: Cardiovascular Disease | Primary: Family Medicine

## 2017-09-22 NOTE — Telephone Encounter (Signed)
Patient missed a new patient appointment today with Dr. Henreitta Cea.  Patient said he had another doctor appointment at the same time.  There are no appointments available the rest of the year.  Please advise. pjp

## 2017-09-24 NOTE — Telephone Encounter (Signed)
Pt calling [facility] to schedule with Dr Eugenio Hoes.

## 2017-09-24 NOTE — Telephone Encounter (Signed)
Patient will have to take first available. He can also get in sooner at the [place] office with Dr Eugenio Hoes (has appt's available next week) if he would like to take sooner appt.

## 2017-10-08 ENCOUNTER — Ambulatory Visit
Admit: 2017-10-08 | Discharge: 2017-10-08 | Payer: PRIVATE HEALTH INSURANCE | Attending: Internal Medicine | Primary: Family Medicine

## 2017-10-08 DIAGNOSIS — I200 Unstable angina: Secondary | ICD-10-CM

## 2017-10-08 NOTE — Assessment & Plan Note (Signed)
Chest pain is concerning for coronary artery disease.  Given his smoking history and risk factors we will plan for stress test.

## 2017-10-08 NOTE — Assessment & Plan Note (Addendum)
Trolled on current medications.  Will continue.  Further medication changes after stress test.

## 2017-10-08 NOTE — Progress Notes (Signed)
[facility]     Outpatient [facility]iology         Chief Complaint   Patient presents with   ??? New Patient     referred by [facility] Dr. Johann Capers   ??? Chest Pain     midsternal, sharp , more when laying on left side       HPI     [name] Peck a 62 y.o. male here for chest pain.  Ongoing for the last 3 weeks, described as substernal, pressure-like, nonradiating associated with shortness of breath, 6 out of 10 of intensity.  He went to the ER in the past and the pain improved with nitroglycerin.  He continues to have chest pain on and off over the past 3 weeks.  He smokes about 10 cigarettes a day for many years now.  Hypertension, is well controlled with his current medication regimen.    PMH  Past Medical History:   Diagnosis Date   ??? Anxiety and depression    ??? Cervical pain (neck)    ??? Chronic back pain    ??? COPD (chronic obstructive pulmonary disease) ([place])    ??? Degenerative cervical disc 03/12/2010   ??? Diabetes mellitus ([place])    ??? GERD (gastroesophageal reflux disease) 03/29/2010   ??? Hyperlipidemia 03/21/2010   ??? Hypertension    ??? Obstructive sleep apnea 05/16/2010   ??? Sleep apnea    ??? Tobacco abuse        PSH  Past Surgical History:   Procedure Laterality Date   ??? BRONCHOSCOPY  09/23/13   ??? COLONOSCOPY     ??? FOOT SURGERY  08-05-13    RIGHT [place]/AKIN    ??? HERNIA REPAIR  08/2009   ??? TOE SURGERY  06-04-12    ARTHROPLASTY 4TH AND 5TH        Social HIstory  Social History     Tobacco Use   ??? Smoking status: Current Every Day Smoker     Packs/day: 0.50     Years: 40.00     Pack years: 20.00     Types: Cigarettes   ??? Smokeless tobacco: Never Used   ??? Tobacco comment: Cutting down, now smoking 10 cigarettes a day   Substance Use Topics   ??? Alcohol use: No     Alcohol/week: 0.0 standard drinks   ??? Drug use: No       Family History  Family History   Problem Relation Age of Onset   ??? High Blood Pressure Mother        Allergies   Allergies   Allergen Reactions   ??? Penicillins Hives       Medications:     Home  Medications:  Were reviewed and are listed in nursing record. and/or listed below    Prior to Admission medications    Medication Sig Start Date End Date Taking? Authorizing Provider   budesonide-formoterol (SYMBICORT) 160-4.5 MCG/ACT AERO Inhale 2 puffs into the lungs 2 times daily 12/13/13  Yes Lacretia Leigh, MD   albuterol (PROVENTIL HFA;VENTOLIN HFA) 108 (90 BASE) MCG/ACT inhaler Inhale 2 puffs into the lungs every 6 hours as needed 12/13/13  Yes Lacretia Leigh, MD   losartan (COZAAR) 100 MG tablet Take 100 mg by mouth daily   Yes Historical Provider, MD   amLODIPine (NORVASC) 10 MG tablet Take 10 mg by mouth daily   Yes Historical Provider, MD   FLUoxetine (PROZAC) 20 MG capsule Take 20 mg by mouth daily Ran  out of prescription   Yes Historical Provider, MD   buPROPion (WELLBUTRIN SR) 100 MG SR tablet Take 100 mg by mouth 2 times daily Ran out of prescription   Yes Historical Provider, MD   tamsulosin (FLOMAX) 0.4 MG capsule Take 0.4 mg by mouth daily   Yes Historical Provider, MD   Loratadine 10 MG CAPS Take 1 tablet by mouth daily   Yes Historical Provider, MD   omeprazole (PRILOSEC) 20 MG capsule Take 1 capsule by mouth Daily. 06/18/11  Yes Sharyon Medicus, MD   naproxen (NAPROSYN) 500 MG tablet Take 1 tablet by mouth 2 times daily (with meals). 06/14/11  Yes Sharyon Medicus, MD   cyclobenzaprine (FLEXERIL) 10 MG tablet Take 1 tablet by mouth 3 times daily as needed. 06/14/11  Yes Sharyon Medicus, MD   metformin (GLUCOPHAGE-XR) 500 MG XR tablet Take 1 tablet by mouth 2 times daily. 06/14/11  Yes Sharyon Medicus, MD   Blood Glucose Monitoring Suppl (BLOOD GLUCOSE MONITOR KIT) KIT by Does not apply route. 06/14/11  Yes Sharyon Medicus, MD   Glucose Blood (BLOOD GLUCOSE TEST STRIPS) STRP Test daily 06/14/11  Yes Sharyon Medicus, MD   nabumetone (RELAFEN) 500 MG tablet Take 1 tablet by mouth 2 times daily. 08/20/10  Yes Sharyon Medicus, MD        Review of Systems   Constitutional: Negative for activity change, appetite change,  diaphoresis, fatigue, fever and unexpected weight change.   HENT: Negative for congestion, facial swelling, mouth sores and nosebleeds.    Eyes: Negative for discharge and visual disturbance.   Respiratory: Negative for cough, chest tightness, shortness of breath and wheezing.    Cardiovascular: Positive for chest pain. Negative for palpitations and leg swelling.   Gastrointestinal: Negative for abdominal distention, abdominal pain, blood in stool and vomiting.   Endocrine: Negative for cold intolerance, heat intolerance and polyuria.   Genitourinary: Negative for difficulty urinating, dysuria, frequency and hematuria.   Musculoskeletal: Negative for back pain, joint swelling, myalgias and neck pain.   Skin: Negative for color change, pallor and rash.   Allergic/Immunologic: Negative for immunocompromised state.   Neurological: Negative for dizziness, syncope, weakness, light-headedness, numbness and headaches.   Hematological: Negative for adenopathy. Does not bruise/bleed easily.   Psychiatric/Behavioral: Negative for behavioral problems, confusion, decreased concentration and suicidal ideas. The patient is not nervous/anxious.        Vitals:    10/08/17 1101   BP: 130/70   Pulse: 66    Weight: 183 lb 12.8 oz (83.4 kg)       Vitals:    10/08/17 1101   BP: 130/70   Pulse: 66   Weight: 183 lb 12.8 oz (83.4 kg)   Height: 6' (1.829 m)       BP Readings from Last 3 Encounters:   10/08/17 130/70   07/15/16 118/72   12/13/13 (!) 139/93       Wt Readings from Last 3 Encounters:   10/08/17 183 lb 12.8 oz (83.4 kg)   12/13/13 180 lb (81.6 kg)   09/23/13 185 lb 13.6 oz (84.3 kg)       Physical Exam   Constitutional: He is oriented to person, place, and time. He appears well-developed and well-nourished. No distress.   HENT:   Head: Normocephalic and atraumatic.   Eyes: Pupils are equal, round, and reactive to light. EOM are normal.   Neck: Normal range of motion. No JVD present. No thyromegaly present.   Cardiovascular:  Normal rate, regular rhythm,  S1 normal, S2 normal, normal heart sounds and intact distal pulses. PMI is not displaced. Exam reveals no gallop and no friction rub.   No murmur heard.  Pulmonary/Chest: Effort normal and breath sounds normal. No stridor. No respiratory distress. He has no wheezes. He has no rales. He exhibits no tenderness.   Abdominal: Soft. Bowel sounds are normal. He exhibits no distension. There is no tenderness. There is no rebound and no guarding.   Musculoskeletal: Normal range of motion. He exhibits no edema or tenderness.   Lymphadenopathy:     He has no cervical adenopathy.   Neurological: He is alert and oriented to person, place, and time. Coordination normal.   Skin: Skin is warm and dry. No rash noted. He is not diaphoretic. No erythema.   Psychiatric: He has a normal mood and affect. His behavior is normal. Judgment and thought content normal.       Labs:       Lab Results   Component Value Date    WBC 6.5 07/15/2016    HGB 14.2 07/15/2016    HCT 41.7 07/15/2016    MCV 97.9 07/15/2016    PLT 150 07/15/2016     Lab Results   Component Value Date    NA 137 09/23/2013    K 4.7 09/23/2013    CL 101 09/23/2013    CO2 27 07/15/2016    BUN 8 09/23/2013    CREATININE 1.2 07/15/2016    GLUCOSE 113 (H) 09/23/2013    CALCIUM 9.3 09/23/2013    AST 34 02/20/2010    LABGLOM >60 07/15/2016    GFRAA >60 07/15/2016         Lab Results   Component Value Date    CHOL 205 (H) 02/20/2010     Lab Results   Component Value Date    TRIG 61 02/20/2010     Lab Results   Component Value Date    HDL 65 (H) 02/20/2010     Lab Results   Component Value Date    LDLCALC 128 (H) 02/20/2010     Lab Results   Component Value Date    LABVLDL 12 02/20/2010     No results found for: [facility]    Lab Results   Component Value Date    INR 1.07 09/23/2013    PROTIME 11.6 09/23/2013       The ASCVD Risk score Mikey Bussing DC Jr., et al., 2013) failed to calculate for the following reasons:    Cannot find a previous HDL lab    Cannot  find a previous total cholesterol lab      Imaging:       Last ECG (if available):  NSR, TWI lateral leads     Last Stress (if available):    Last Cath (if available):    Last TTE/TEE(if available):    Last CMR  (if available):      Assessment / Plan:     Angina pectoris ([place])  Chest pain is concerning for coronary artery disease.  Given his smoking history and risk factors we will plan for stress test.    Hypertension  Trolled on current medications.  Will continue.  Further medication changes after stress test.    I had the opportunity to review the clinical symptoms and presentation of [name] Peck.     Tobacco use was discussed with the patient and educated on the negative effects.I have asked the patient to not utilize these agents.  All questions and concerns were addressed to the patient/family. Alternatives to my treatment were discussed. The note was completed using EMR. Every effort wasmade to ensure accuracy; however, inadvertent computerized transcription errors may be present.    Thank you for allowing me to participate in thecare or [place] R. Eugenio Hoes, MD, [facility], [facility]

## 2017-10-13 ENCOUNTER — Inpatient Hospital Stay: Admit: 2017-10-13 | Payer: PRIVATE HEALTH INSURANCE | Primary: Family Medicine

## 2017-10-13 DIAGNOSIS — I209 Angina pectoris, unspecified: Secondary | ICD-10-CM

## 2017-10-13 LAB — NM MYOCARDIAL SPECT REST EXERCISE OR RX: Left Ventricular Ejection Fraction: 79

## 2017-10-13 MED ORDER — NORMAL SALINE FLUSH 0.9 % IV SOLN
0.9 % | INTRAVENOUS | Status: DC | PRN
Start: 2017-10-13 — End: 2017-10-14
  Administered 2017-10-13 (×2): 10 mL via INTRAVENOUS

## 2017-10-13 MED ORDER — TECHNETIUM TC 99M TETROFOSMIN IV KIT
Freq: Once | INTRAVENOUS | Status: AC | PRN
Start: 2017-10-13 — End: 2017-10-13
  Administered 2017-10-13: 21:00:00 30 via INTRAVENOUS

## 2017-10-13 MED ORDER — REGADENOSON 0.4 MG/5ML IV SOLN
0.4 | INTRAVENOUS | Status: AC
Start: 2017-10-13 — End: 2017-10-13

## 2017-10-13 MED ORDER — REGADENOSON 0.4 MG/5ML IV SOLN
0.4 MG/5ML | Freq: Once | INTRAVENOUS | Status: AC | PRN
Start: 2017-10-13 — End: 2017-10-13
  Administered 2017-10-13: 21:00:00 0.4 mg via INTRAVENOUS

## 2017-10-13 MED ORDER — TECHNETIUM TC 99M TETROFOSMIN IV KIT
Freq: Once | INTRAVENOUS | Status: AC | PRN
Start: 2017-10-13 — End: 2017-10-13
  Administered 2017-10-13: 18:00:00 10 via INTRAVENOUS

## 2017-10-13 MED FILL — LEXISCAN 0.4 MG/5ML IV SOLN: 0.4 MG/5ML | INTRAVENOUS | Qty: 5

## 2017-10-13 NOTE — Unmapped (Signed)
Cardiac Perfusion Imaging       Demographics      Patient Name       [name]      Date of Study      10/13/2017         Gender              Male      Patient Number     [id number]         Date of Birth       06-21-1955      Visit Number       [id number]          Age                 62 year(s)      Accession Number   [id number]          Room Number      Corporate ID       [id number]           NM Technician       Jacqulynn Cadet,                                                             CNMT      Nurse                                 Interpreting        [facility]                                         Physician           Melbourne Abts,                                                             MD      Ordering Physician Melbourne Abts,                      MD      The procedure was explained in detail to the patient. Risks,   complications and alternative treatments were reviewed. Written consent   was obtained.     Procedure  Procedure Type:      Nuclear Stress Test:Pharmacological, NM MYOCARDIAL SPECT REST EXERCISE OR   RX      Study location: [facility] - Nuclear Medicine      Indications: Chest pain, Shortness of   breath, Hypertension and   Lightheadedness.                Hospital Status: Outpatient.     Height: 72 inches Weight: 183 pounds       Conclusions      Summary   Overall findings represent a low risk scan.   There is normal isotope uptake at stress and rest. There is no evidence of   myocardial ischemia or  scar.   Normal LV size and systolic function.   Normal myocardial perfusion study.   ECG: Non-diagnostic EKG response due to failure to reach target heart rate.     Stress Protocols      Resting ECG   Normal sinus rhythm.   Non-specific ST changes.      Resting HR:85 bpm  Resting BP:119/76 mmHg     Stress Protocol:Pharmacologic - Lexiscan's       Peak HR:99 bpm                             HR/BP product:14949   Peak BP:151/41 mmHg   Predicted HR: 158 bpm   % of predicted HR:  63   Test duration: 1 min   Reason for termination:Chest pain      Arrhythmias   No significant rhythm abnormality.      Symptoms   No symptoms with lexiscan.      Complications   Procedure complication was none.      Stress Interpretation   Non-diagnostic EKG response due to failure to reach target heart rate.      Imaging Protocols      - One Day      Rest                          Stress      Isotope:Myoview/Tetrofosmin   Isotope: Myoview/Tetrofosmin   Isotope dose:9.3 mCi          Isotope dose:28.9 mCi   Administration Route:I.V.     Administration Route:I.V.                                    Technique:      Gated     Imaging Results         Stress ejection     Ejection fraction:79 %     EDV :92 ml     ESV :19 ml     Stroke volume :73 ml     LV mass :121 gr     Medical History       Signatures      ------------------------------------------------------------------   Electronically signed by Melbourne Abts, MD (Interpreting   physician) on 10/14/2017 at 08:24   ------------------------------------------------------------------       Confidential Patient Information Accompanied are [facility] results that are being delivered by [facility].   If you receive a clinical result for a patient that is not yours please fax to [facility] at [phone].

## 2017-10-23 DIAGNOSIS — J441 Chronic obstructive pulmonary disease with (acute) exacerbation: Secondary | ICD-10-CM | POA: Diagnosis not present

## 2017-10-23 DIAGNOSIS — J449 Chronic obstructive pulmonary disease, unspecified: Secondary | ICD-10-CM | POA: Diagnosis not present

## 2017-10-23 DIAGNOSIS — E6609 Other obesity due to excess calories: Secondary | ICD-10-CM | POA: Diagnosis not present

## 2017-10-23 DIAGNOSIS — Z6836 Body mass index (BMI) 36.0-36.9, adult: Secondary | ICD-10-CM | POA: Diagnosis not present

## 2017-11-12 ENCOUNTER — Encounter: Attending: Internal Medicine | Primary: Family Medicine

## 2017-11-14 DIAGNOSIS — Z23 Encounter for immunization: Secondary | ICD-10-CM | POA: Diagnosis not present

## 2017-12-05 DIAGNOSIS — D485 Neoplasm of uncertain behavior of skin: Secondary | ICD-10-CM | POA: Diagnosis not present

## 2017-12-05 DIAGNOSIS — Z6837 Body mass index (BMI) 37.0-37.9, adult: Secondary | ICD-10-CM | POA: Diagnosis not present

## 2017-12-05 DIAGNOSIS — L918 Other hypertrophic disorders of the skin: Secondary | ICD-10-CM | POA: Diagnosis not present

## 2017-12-11 DIAGNOSIS — Z23 Encounter for immunization: Secondary | ICD-10-CM | POA: Diagnosis not present

## 2017-12-11 DIAGNOSIS — D485 Neoplasm of uncertain behavior of skin: Secondary | ICD-10-CM | POA: Diagnosis not present

## 2017-12-11 DIAGNOSIS — L918 Other hypertrophic disorders of the skin: Secondary | ICD-10-CM | POA: Diagnosis not present

## 2017-12-11 DIAGNOSIS — L449 Papulosquamous disorder, unspecified: Secondary | ICD-10-CM | POA: Diagnosis not present

## 2017-12-11 DIAGNOSIS — Z6837 Body mass index (BMI) 37.0-37.9, adult: Secondary | ICD-10-CM | POA: Diagnosis not present

## 2018-01-01 ENCOUNTER — Inpatient Hospital Stay: Admit: 2018-01-01 | Discharge: 2018-01-06 | Payer: PRIVATE HEALTH INSURANCE | Attending: Pain Medicine

## 2018-01-01 DIAGNOSIS — M4712 Other spondylosis with myelopathy, cervical region: Secondary | ICD-10-CM

## 2018-01-01 DIAGNOSIS — Z1389 Encounter for screening for other disorder: Secondary | ICD-10-CM | POA: Diagnosis not present

## 2018-01-01 DIAGNOSIS — J441 Chronic obstructive pulmonary disease with (acute) exacerbation: Secondary | ICD-10-CM | POA: Diagnosis not present

## 2018-01-01 DIAGNOSIS — Z6837 Body mass index (BMI) 37.0-37.9, adult: Secondary | ICD-10-CM | POA: Diagnosis not present

## 2018-01-01 DIAGNOSIS — S62637A Displaced fracture of distal phalanx of left little finger, initial encounter for closed fracture: Secondary | ICD-10-CM | POA: Diagnosis not present

## 2018-01-13 ENCOUNTER — Ambulatory Visit: Admit: 2018-01-13 | Discharge: 2018-01-13 | Payer: PRIVATE HEALTH INSURANCE | Attending: Adult Health

## 2018-01-13 DIAGNOSIS — K118 Other diseases of salivary glands: Secondary | ICD-10-CM

## 2018-01-13 NOTE — Unmapped (Signed)
[facility]  DEPARTMENT OF OTOLARYNGOLOGY  HEAD & NECK SURGERY CENTER    Patient Name: [name]  Medical Record Number:  [id number]  Primary Care Physician:  Hendricks Limes DORIOTT, DO  Date of Visit: 01/13/2018     CHIEF COMPLAINT  New Patient Visit/ Consultation (Neck Mass Ref. by Dr. Lanora Manis Doriott)    HISTORY OF PRESENT ILLNESS    Maximus is a(n) 62 y.o. male who presents to ENT clinic with Dr Darrow Bussing for a parotid mass and hoarseness.    Pt had a MRI of the c-spine for chronic neck pain- incidentally a right parotid mass was found. 01/01/18- Multilobulated T2 hyperintense lesion within the right superficial parotid lobe. On this appears similar as compared with the CT exam from 2017 and may reflect a benign lesion such as Warthin's tumors, tissue sampling is recommended for definitive diagnosis.    Pt is a smoker, 1/2ppd. States his voice has been more deep/hoarse the past year. No throat pain or trouble swallowing.     Pain: no  Otalgia: no  Odynophagia: no  Dysphagia: no  Dyspnea: no  Lumps in neck: yes  Hemoptysis: no  Fever: no  Chills: no  Sweats: no  Weight Loss: no    Patient Active Problem List   Diagnosis   ??? Personal history of other specified diseases(V13.89)   ??? Gastroesophageal reflux disease without esophagitis   ??? Abnormal liver enzymes   ??? Hiatal hernia   ??? 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 Ischemic stroke (CMS Dx)   ??? 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 Essential hypertension   ??? Type 2 diabetes mellitus with circulatory disorder (HCC)   ??? Chronic obstructive pulmonary disease (CMS Dx)   ??? Spondylosis of cervical region without myelopathy or radiculopathy   ??? Screen for colon cancer     Past Medical History:   Diagnosis Date   ??? COPD (chronic obstructive pulmonary disease) (CMS Dx)    ??? Diabetes mellitus (CMS Dx)    ??? Dysphagia    ??? GERD  (gastroesophageal reflux disease)    ??? Hepatitis    ??? Hypertension      Past Surgical History:   Procedure Laterality Date   ??? ESOPHAGOGASTRODUODENOSCOPY N/A 11/19/2013    Procedure: ESOPHAGOGASTRODUODENOSCOPY WITH MAC;  Surgeon: Charlane Ferretti, MD;  Location: [facility] ENDOSCOPY;  Service: Gastroenterology;  Laterality: N/A;   ??? FOOT SURGERY Left    ??? HERNIA REPAIR      about 5 yrs ago     No family history on file.  Social History     Socioeconomic History   ??? Marital status: Legally Separated     Spouse name: Not on file   ??? Number of children: Not on file   ??? Years of education: Not on file   ??? Highest education level: Not on file   Occupational History   ??? Not on file   Social Needs   ??? Financial resource strain: Not on file   ??? Food insecurity:     Worry: Not on file     Inability: Not on file   ??? Transportation needs:     Medical: Not on file     Non-medical: Not on file   Tobacco Use   ??? Smoking status: Current Every Day Smoker  Packs/day: 0.25     Years: 5.00     Pack years: 1.25     Types: Cigarettes   ??? Smokeless tobacco: Never Used   Substance and Sexual Activity   ??? Alcohol use: No   ??? Drug use: No   ??? Sexual activity: Yes     Partners: Female     Birth control/protection: Condom   Lifestyle   ??? Physical activity:     Days per week: Not on file     Minutes per session: Not on file   ??? Stress: Not on file   Relationships   ??? Social connections:     Talks on phone: Not on file     Gets together: Not on file     Attends religious service: Not on file     Active member of club or organization: Not on file     Attends meetings of clubs or organizations: Not on file     Relationship status: Not on file   ??? Intimate partner violence:     Fear of current or ex partner: Not on file     Emotionally abused: Not on file     Physically abused: Not on file     Forced sexual activity: Not on file   Other Topics Concern   ??? Caffeine Use Not Asked   ??? Occupational Exposure Not Asked   ??? Exercise Not Asked   ??? Seat Belt  Not Asked   Social History Narrative   ??? Not on file     DRUG/FOOD ALLERGIES  Penicillins    CURRENT MEDICATIONS  Outpatient Encounter Medications as of 01/13/2018   Medication Sig Dispense Refill   ??? ALBUTEROL INHL Inhale into the lungs.     ??? amLODIPine (NORVASC) 10 MG tablet Take 10 mg by mouth daily     ??? aspirin 81 MG EC tablet Take 1 tablet (81 mg total) by mouth daily. 30 tablet 0   ??? atorvastatin (LIPITOR) 80 MG tablet Take 1 tablet (80 mg total) by mouth at bedtime. 30 tablet 0   ??? blood sugar diagnostic Strp Test daily     ??? blood-glucose meter Misc by Does not apply route.     ??? buPROPion SR (WELLBUTRIN SR) 100 MG tablet Take 100 mg by mouth 2 times daily Ran out of prescription     ??? entecavir (BARACLUDE) 0.5 MG tablet Take 1 tablet (0.5 mg total) by mouth daily. 30 tablet 2   ??? FLUoxetine (PROZAC) 20 MG capsule      ??? hydroCHLOROthiazide (MICROZIDE) 12.5 mg capsule Take 1 capsule (12.5 mg total) by mouth daily. 30 capsule 3   ??? ibuprofen (ADVIL,MOTRIN) 800 MG tablet Take 0.5 tablets (400 mg total) by mouth every 6 hours as needed for up to 16 doses. 16 tablet 0   ??? ketoconazole (NIZORAL) 2 % cream      ??? loratadine 10 mg Cap Take 1 tablet by mouth daily     ??? losartan (COZAAR) 100 MG tablet Take 1 tablet (100 mg total) by mouth daily. Take 100 mg by mouth daily 30 tablet 0   ??? metFORMIN (GLUCOPHAGE) 500 MG tablet Take 500 mg by mouth 2 times a day with meals.     ??? ondansetron (ZOFRAN ODT) 4 MG disintegrating tablet Take 1 tablet (4 mg total) by mouth every 6 hours as needed for Nausea. 16 tablet 0   ??? orphenadrine (NORFLEX) 100 mg tablet Take 1 tablet (100 mg total)  by mouth 2 times a day. 10 tablet 0   ??? oxyCODONE (ROXICODONE) 5 MG immediate release tablet Take 1 tablet (5 mg total) by mouth every 4 hours as needed for Pain. 30 tablet 0   ??? oxyCODONE-acetaminophen (PERCOCET) 10-325 mg per tablet 1 tablet every 4 hours as needed.        ??? tamsulosin (FLOMAX) 0.4 mg Cp24 Take 0.4 mg by mouth daily     ???  traZODone (DESYREL) 100 MG tablet        No facility-administered encounter medications on file as of 01/13/2018.      REVIEW OF SYSTEMS  See scanned Review of Systems sheet.    PHYSICAL EXAM  BP 136/76 (BP Location: Left arm, Patient Position: Sitting, BP Cuff Size: Regular)    Pulse 76    Ht 6' (1.829 m)    Wt 183 lb 3.2 oz (83.1 kg)    BMI 24.85 kg/m??     General Appearance: well-developed, well-nourished and in no acute distress   Communication: I was able to converse well with the patient. Patient was able to answer questions adequately and appropriately.     Head & Face (general): normal appearance   Face (palpation): no sinus tenderness   Salivary Glands (palpation): salivary glands NL size.   Voice Quality: Normal - Deep    Eyes:   External: No masses or abnormality  Extraoccular Muscle: intact     Nose:   External: external nose without infection or abnormality.     Ears:   External Ears: external ears are of normal appearance. No masses, lesions or scars.     House-Brackman Grading System for Facial Nerve Dysfunction:   (Left) Grade 1: Normal movement   (Right) Grade 1: Normal movement     Mouth:   Lip/teeth/gums: healthy dentition, lips, teeth and gums in good condition. no gingival inflammation, no labial lesions.   Mucosa: No leukoplakia or masses. Hard/soft palates and tongue of NL symmetry.   Tongue: normal midline, normal mucosa   Oropharynx/ Tonsils: pharyngeal walls and tonsillar fossae without abnormalities.   Hypopharynx: Gag   Larynx: Gag  Nasopharynx: Gag    Neck: Superficial well circumscribed 8mm cystic mass on the right angle of mandible; no other parotid masses palpated to correlate with MRI findings.  Neck Exam: no cervical, supraclavicular or auricular adenopathy   Thyroid Exam: no masses or fullness of thyroid     Respiratory Inspection:   Respiratory Effort: breathing comfortably, no increased work of breathing.     Cardiovascular:   Carotid arteries: pulses 2+, symmetric, no bruits      Skin:   Inspection: no lower extremity edema, rashes, lesions, or ulcerations, well developed, turgor intact   Palpation: no subcutaneous nodules or induration     Musculoskeletal:   Gait and Station: intact without difficulty     Neurologic:   Cranial Nerves: II - XII grossly intact, upper extremities-normal strength, lower extremities-normal strength, deep tendon reflexes-normal, cerebellar exam-normal.     Mental Status:   Orientation: oriented to time, place, and person   Mood and affect: NL mood and affect. A+O x 4.      Flexible Laryngoscopy Anesthesia: None, Topical 1% Xylocaine with Afrin, 4% Lidocaine  Estimated Blood Loss: None    Procedure:   After obtaining verbal consent, the patient was placed in the examination chair in the upright position.  Decongestant and topical anesthetic was sprayed in the nasal cavity. After allowing adequate time for hemostatic effect, the  flexible 4 mm laryngoscope was passed via the    Nasal Septum: normal  Nasal Findings: normal     Nasopharynx: normal    Eustachian Tubes: unobstructed with no drainage   Oropharynx: normal     Base of Tongue: normal    Epiglottis: normal    True Vocal Cord normal- Reinke's edema  False Vocal Cord: normal     True Vocal Cord Movement: normal mobility  Hypopharynx Mucosa: normal     * Patient tolerated the procedure well with no complications   * Patient was instructed not to eat for 30 minutes following procedure.   * Patient was instructed that they may notice minor bleeding.      ASSESSMENT & PLAN    Parotid mass-  Recommend US guided biopsy of parotid mass to confirm benign mass  Will schedule pt to see Dr Delfino Lovett     Hoarseness- Reinke's Edema  No concerning vocal cord lesions  Discussed smoking cessation  Will refer to voice therapy once pt stops smoking     We discussed the patient's smoking habits and the fact that smoking is associated with deleterious effects on the upper aerodigestive tract.  The risks of continuing to smoke  or use tobacco products, which including irritation of or malignant transformation in the upper respiratory tract were discussed. We discussed and the patient was instructed on multiple methods for stopping smoking including smoking cessation programs, transdermal nicotine patches/gum to central/systemic medications.  I have strongly encouraged further discussion with the patient's PCP about implementation of any of these interventions. Spoke for 3+ minutes.

## 2018-01-27 ENCOUNTER — Emergency Department (HOSPITAL_COMMUNITY)
Admission: EM | Admit: 2018-01-27 | Discharge: 2018-01-27 | Disposition: A | Discharge status: Home / Self Care | Payer: BLUE CROSS/BLUE SHIELD | Attending: Emergency Medicine | Admitting: Emergency Medicine

## 2018-01-27 ENCOUNTER — Emergency Department (HOSPITAL_COMMUNITY): Payer: BLUE CROSS/BLUE SHIELD

## 2018-01-27 ENCOUNTER — Encounter (HOSPITAL_COMMUNITY): Payer: Self-pay

## 2018-01-27 ENCOUNTER — Other Ambulatory Visit: Payer: Self-pay

## 2018-01-27 DIAGNOSIS — R079 Chest pain, unspecified: Secondary | ICD-10-CM

## 2018-01-27 DIAGNOSIS — R0789 Other chest pain: Secondary | ICD-10-CM | POA: Diagnosis not present

## 2018-01-27 DIAGNOSIS — Z79899 Other long term (current) drug therapy: Secondary | ICD-10-CM | POA: Insufficient documentation

## 2018-01-27 DIAGNOSIS — R0602 Shortness of breath: Secondary | ICD-10-CM | POA: Diagnosis not present

## 2018-01-27 DIAGNOSIS — Z87891 Personal history of nicotine dependence: Secondary | ICD-10-CM | POA: Insufficient documentation

## 2018-01-27 DIAGNOSIS — I10 Essential (primary) hypertension: Secondary | ICD-10-CM | POA: Insufficient documentation

## 2018-01-27 DIAGNOSIS — J441 Chronic obstructive pulmonary disease with (acute) exacerbation: Secondary | ICD-10-CM | POA: Diagnosis not present

## 2018-01-27 LAB — CBC
HEMATOCRIT: 48.7 % (ref 39.0–52.0)
Hemoglobin: 15.2 g/dL (ref 13.0–17.0)
MCH: 30.8 pg (ref 26.0–34.0)
MCHC: 31.2 g/dL (ref 30.0–36.0)
MCV: 98.8 fL (ref 80.0–100.0)
PLATELETS: 226 10*3/uL (ref 150–400)
RBC: 4.93 MIL/uL (ref 4.22–5.81)
RDW: 13.7 % (ref 11.5–15.5)
WBC: 7.4 10*3/uL (ref 4.0–10.5)
nRBC: 0 % (ref 0.0–0.2)

## 2018-01-27 LAB — BRAIN NATRIURETIC PEPTIDE: B NATRIURETIC PEPTIDE 5: 84 pg/mL (ref 0.0–100.0)

## 2018-01-27 LAB — BASIC METABOLIC PANEL
Anion gap: 8 (ref 5–15)
BUN: 15 mg/dL (ref 8–23)
CALCIUM: 9.1 mg/dL (ref 8.9–10.3)
CO2: 29 mmol/L (ref 22–32)
CREATININE: 0.9 mg/dL (ref 0.61–1.24)
Chloride: 101 mmol/L (ref 98–111)
GFR calc non Af Amer: >60 mL/min (ref >60)
Glucose, Bld: 99 mg/dL (ref 70–99)
Potassium: 3.8 mmol/L (ref 3.5–5.1)
Sodium: 138 mmol/L (ref 135–145)

## 2018-01-27 LAB — INFLUENZA PANEL BY PCR (TYPE A & B)
Influenza A By PCR: NEGATIVE
Influenza B By PCR: NEGATIVE

## 2018-01-27 LAB — TROPONIN I: Troponin I: <0.03 ng/mL (ref <0.03)

## 2018-01-27 MED ORDER — IPRATROPIUM-ALBUTEROL 0.5-2.5 (3) MG/3ML IN SOLN
3.0000 mL | Freq: Once | RESPIRATORY_TRACT | Status: AC
  Administered 2018-01-27: 3 mL via RESPIRATORY_TRACT
  Filled 2018-01-27: qty 3

## 2018-01-27 MED ORDER — METHYLPREDNISOLONE SODIUM SUCC 125 MG IJ SOLR
125.0000 mg | Freq: Once | INTRAMUSCULAR | Status: AC
  Administered 2018-01-27: 125 mg via INTRAVENOUS
  Filled 2018-01-27: qty 2

## 2018-01-27 MED ORDER — PREDNISONE 20 MG PO TABS: 60.0000 mg | ORAL_TABLET | Freq: Every day | ORAL | 0 refills | Status: DC

## 2018-01-27 NOTE — Discharge Instructions (Signed)
You were evaluated in the emergency department for increased shortness of breath.  Your chest x-ray did not show a pneumonia and your lab work and EKG did not show any signs of heart injury.  We are treating you with some steroids for probable COPD.  Please continue to use your inhalers.  Follow-up with your doctor.  Return if any worsening symptoms.

## 2018-01-27 NOTE — ED Provider Notes (Signed)
[facility] EMERGENCY DEPARTMENT Provider Note   CSN: [id number] Arrival date & time: 01/27/18  1040     History   Chief Complaint Chief Complaint  Patient presents with  . Chest Pain    HPI Daniel [name] is a 62 y.o. male.  He is presenting with a week's worth of increased shortness of breath dyspnea on exertion.  He is noticed expiratory wheeze.  He has a history of COPD and he thinks that is what is going on.  Daniel [name] he saw his primary last month with some breathing problems and he thinks he was put on an antibiotic which helped for a little bit.  He said he has had some chest tightness when he noticed a little numbness down in his left foot.  Says he had that before but not for a while.  The history is provided by the patient.  Shortness of Breath  This is a recurrent problem. The current episode started more than 1 week ago. The problem has been gradually worsening. Associated symptoms include cough, sputum production, wheezing, PND, orthopnea and chest pain. Pertinent negatives include no fever, no rhinorrhea, no sore throat, no neck pain, no hemoptysis, no syncope, no vomiting, no abdominal pain, no rash, no leg pain, no leg swelling and no claudication. It is unknown what precipitated the problem. He has tried ipratropium inhalers and beta-agonist inhalers for the symptoms. The treatment provided mild relief. He has had prior hospitalizations. He has had prior ED visits. He has had no prior ICU admissions. Associated medical issues include COPD.    Past Medical History:  Diagnosis Date  . COPD (chronic obstructive pulmonary disease) ([place])   . Hypertension     Patient Active Problem List   Diagnosis Date Noted  . Hematochezia 04/02/2017  . Heme + stool 04/02/2017  . Rotator cuff tear 04/05/2013  . Shoulder dislocation 04/05/2013  . Acute respiratory failure ([place]) 12/22/2012  . Essential hypertension, benign 12/22/2012  . COPD exacerbation ([place]) 12/21/2012  .  Hypertriglyceridemia without hypercholesterolemia 03/12/2011  . Chest pain 03/06/2011  . Anxiety 03/06/2011  . Obesity (BMI 30-39.9) 03/06/2011  . Pre-diabetes 03/06/2011    Past Surgical History:  Procedure Laterality Date  . APPENDECTOMY    . COLONOSCOPY N/A 04/24/2017   Procedure: COLONOSCOPY;  Surgeon: Danie Binder, MD;  Location: [facility];  Service: Endoscopy;  Laterality: N/A;  1:00pm  . HEMORRHOID SURGERY    . HERNIA REPAIR     RLQ, umbilical        Home Medications    Prior to Admission medications   Medication Sig Start Date End Date Taking? Authorizing Provider  acetaminophen (TYLENOL) 500 MG tablet Take 1,000 mg by mouth daily as needed for moderate pain or headache.    [provider]  albuterol (PROVENTIL HFA;VENTOLIN HFA) 108 (90 BASE) MCG/ACT inhaler Inhale 2 puffs into the lungs every 4 (four) hours as needed for wheezing or shortness of breath. 12/23/12   Samuella Cota, MD  albuterol (PROVENTIL) (2.5 MG/3ML) 0.083% nebulizer solution Take 2.5 mg by nebulization every 6 (six) hours as needed for wheezing or shortness of breath.    [provider]  albuterol (PROVENTIL) 4 MG tablet Take 4 mg by mouth 4 (four) times daily.    [provider]  amLODipine (NORVASC) 5 MG tablet Take 5 mg by mouth daily.    [provider]  enalapril (VASOTEC) 5 MG tablet Take 5 mg by mouth 2 (two) times daily.  [provider]  furosemide (LASIX) 20 MG tablet Take 20 mg by mouth daily as needed for edema.  03/06/13   [provider]  Glucosamine-Chondroitin (OSTEO BI-FLEX REGULAR STRENGTH PO) Take 1 tablet by mouth daily.    [provider]  ibuprofen (ADVIL,MOTRIN) 200 MG tablet Take 400 mg by mouth daily as needed for headache or moderate pain.    [provider]  Multiple Vitamin (MULITIVITAMIN WITH MINERALS) TABS Take 1 tablet by mouth daily.    [provider]  Tamsulosin HCl (FLOMAX) 0.4 MG  CAPS Take 0.4 mg by mouth at bedtime.     [provider]    Family History Family History  Problem Relation Age of Onset  . Seizures Mother   . Hypertension Mother   . COPD Father   . Colon cancer Neg Hx   . Gastric cancer Neg Hx   . Esophageal cancer Neg Hx     Social History Social History   Tobacco Use  . Smoking status: Former Smoker    Packs/day: 0.30    Years: 40.00    Pack years: 12.00    Types: Cigarettes    Last attempt to quit: 01/28/2013    Years since quitting: 5.0  . Smokeless tobacco: Never Used  Substance Use Topics  . Alcohol use: No  . Drug use: No     Allergies   Patient has no known allergies.   Review of Systems Review of Systems  Constitutional: Negative for fever.  HENT: Negative for rhinorrhea and sore throat.   Eyes: Negative for visual disturbance.  Respiratory: Positive for cough, sputum production, shortness of breath and wheezing. Negative for hemoptysis.   Cardiovascular: Positive for chest pain, orthopnea and PND. Negative for claudication, leg swelling and syncope.  Gastrointestinal: Negative for abdominal pain and vomiting.  Genitourinary: Negative for dysuria.  Musculoskeletal: Negative for neck pain.  Skin: Negative for rash.  Neurological: Positive for light-headedness and numbness.     Physical Exam Updated Vital Signs BP 138/76 (BP Location: Right Arm)   Pulse 74   Temp 98.1 F (36.7 C) (Oral)   Resp 20   Ht 6\' 2"  (1.88 m)   Wt 130.2 kg   SpO2 95%   BMI 36.85 kg/m   Physical Exam Vitals signs and nursing note reviewed.  Constitutional:      Appearance: He is well-developed. He is obese. He is not ill-appearing.  HENT:     Head: Normocephalic and atraumatic.  Eyes:     Conjunctiva/sclera: Conjunctivae normal.  Neck:     Musculoskeletal: Neck supple.  Cardiovascular:     Rate and Rhythm: Normal rate and regular rhythm.     Heart sounds: Normal heart sounds. No murmur.  Pulmonary:     Effort:  Pulmonary effort is normal. No respiratory distress.     Breath sounds: Wheezing (scattered) present.  Abdominal:     Palpations: Abdomen is soft. There is no mass.     Tenderness: There is no abdominal tenderness.  Musculoskeletal: Normal range of motion.     Right lower leg: Edema (trace bilat symetric) present.     Left lower leg: Edema present.     Comments: He is got some lumps on his right arm that he says are lipomas not been there a long time.  Skin:    General: Skin is warm and dry.     Capillary Refill: Capillary refill takes less than 2 seconds.  Neurological:     General:  No focal deficit present.     Mental Status: He is alert and oriented to person, place, and time.      ED Treatments / Results  Labs (all labs ordered are listed, but only abnormal results are displayed) Labs Reviewed  BASIC METABOLIC PANEL  CBC  TROPONIN I  BRAIN NATRIURETIC PEPTIDE  INFLUENZA PANEL BY PCR (TYPE A & B)    EKG EKG Interpretation  Date/Time:  Tuesday January 27 2018 11:07:59 EST Ventricular Rate:  72 PR Interval:  136 QRS Duration: 86 QT Interval:  384 QTC Calculation: 420 R Axis:   47 Text Interpretation:  Normal sinus rhythm Normal ECG compared with prior 11/14 Confirmed by Aletta Edouard [phone]) on 01/27/2018 12:07:27 PM   Radiology Dg Chest 2 View  Result Date: 01/27/2018 CLINICAL DATA:  Chest and left arm pain with shortness of breath this morning. EXAM: CHEST - 2 VIEW COMPARISON:  Chest x-ray 12/20/2012 FINDINGS: The cardiac silhouette, mediastinal and hilar contours are within normal limits and stable. There is mild tortuosity of the thoracic aorta. Streaky bibasilar atelectasis but no infiltrates, edema or effusions. The bony thorax is intact. IMPRESSION: Streaky basilar atelectasis but no infiltrates, edema or effusions. Electronically Signed   By: Marijo Sanes M.D.   On: 01/27/2018 11:34    Procedures Procedures (including critical care time)  Medications  Ordered in ED Medications  ipratropium-albuterol (DUONEB) 0.5-2.5 (3) MG/3ML nebulizer solution 3 mL (3 mLs Nebulization Given 01/27/18 1403)  methylPREDNISolone sodium succinate (SOLU-MEDROL) 125 mg/2 mL injection 125 mg (125 mg Intravenous Given 01/27/18 1251)     Initial Impression / Assessment and Plan / ED Course  I have reviewed the triage vital signs and the nursing notes.  Pertinent labs & imaging results that were available during my care of the patient were reviewed by me and considered in my medical decision making (see chart for details).  Clinical Course as of Jan 27 1857  Tue Jan 27, 3865  4720 62 year old male with increased shortness of breath.  He has a history of COPD and is got some wheezing on exam.  Troponin and BNP negative.  No white count no fever.  Wife was concerned for flu so sent that off and that is negative.  Chest x-ray does not show a definite infiltrate.  Sounds like he may have been on antibiotics within the month by his PCP.  We will give him some steroids here and a breathing treatment and then trending pulse ox.  As long as is not the setting likely can be discharged.   [MB]  1518 They did a trending pulse ox in the patient he said he was 94 at rest and went down to 90 with exertion but he felt pretty good.  He feels well enough to go would like to be discharged.   [MB]    Clinical Course User Index [MB] Hayden Rasmussen, MD     Final Clinical Impressions(s) / ED Diagnoses   Final diagnoses:  COPD exacerbation ([place])  Nonspecific chest pain    ED Discharge Orders         Ordered    predniSONE (DELTASONE) 20 MG tablet  Daily     01/27/18 1520           Hayden Rasmussen, MD 01/27/18 1858

## 2018-01-27 NOTE — ED Notes (Signed)
Pt ambulated well. O2 sats went from 94 to 90.

## 2018-01-27 NOTE — ED Triage Notes (Signed)
Pt started having chest pain this morning at 9:30 as well as SOB and some numbness down his left leg. Pt states it tight and it radiates down his left arm.

## 2018-01-27 NOTE — ED Notes (Signed)
EKG given to Dr

## 2018-02-04 ENCOUNTER — Ambulatory Visit: Payer: PRIVATE HEALTH INSURANCE

## 2018-02-04 NOTE — Unmapped (Signed)
I called patient to RS his missed appt on 02/04/18.  No answer and no voicemail.

## 2018-03-12 ENCOUNTER — Inpatient Hospital Stay: Admit: 2018-03-12 | Payer: PRIVATE HEALTH INSURANCE | Primary: Family Medicine

## 2018-03-12 ENCOUNTER — Encounter

## 2018-03-12 DIAGNOSIS — R1011 Right upper quadrant pain: Secondary | ICD-10-CM

## 2018-03-21 ENCOUNTER — Ambulatory Visit: Payer: PRIVATE HEALTH INSURANCE | Primary: Family Medicine

## 2018-03-25 ENCOUNTER — Encounter

## 2018-03-25 ENCOUNTER — Inpatient Hospital Stay: Admit: 2018-03-25 | Payer: PRIVATE HEALTH INSURANCE | Primary: Family Medicine

## 2018-03-25 DIAGNOSIS — R109 Unspecified abdominal pain: Secondary | ICD-10-CM

## 2018-03-26 DIAGNOSIS — Z6837 Body mass index (BMI) 37.0-37.9, adult: Secondary | ICD-10-CM | POA: Diagnosis not present

## 2018-03-26 DIAGNOSIS — Z1389 Encounter for screening for other disorder: Secondary | ICD-10-CM | POA: Diagnosis not present

## 2018-03-26 DIAGNOSIS — H04201 Unspecified epiphora, right lacrimal gland: Secondary | ICD-10-CM | POA: Diagnosis not present

## 2018-03-26 DIAGNOSIS — R51 Headache: Secondary | ICD-10-CM | POA: Diagnosis not present

## 2018-04-01 ENCOUNTER — Ambulatory Visit: Payer: PRIVATE HEALTH INSURANCE | Attending: Adult Health

## 2018-04-01 ENCOUNTER — Ambulatory Visit: Admit: 2018-04-01 | Discharge: 2018-04-01 | Payer: PRIVATE HEALTH INSURANCE

## 2018-04-01 DIAGNOSIS — K118 Other diseases of salivary glands: Secondary | ICD-10-CM

## 2018-04-01 NOTE — Unmapped (Signed)
[facility]  DEPARTMENT OF OTOLARYNGOLOGY  HEAD & NECK SURGERY CENTER    Patient Name: [name]  Medical Record Number:  [id number]  Primary Care Physician:  Hendricks Limes DORIOTT, DO  Date of Visit: 04/01/2018     CHIEF COMPLAINT  Follow-up (Parotid mass )    HISTORY OF PRESENT ILLNESS    [name] is a(n) 63 y.o. male who presents to ENT clinic for concern of a superficial mass over his parotid.       Pt had a MRI of the c-spine for chronic neck pain- incidentally a right parotid mass was found. 01/01/18- Multilobulated T2 hyperintense lesion within the right superficial parotid lobe. On this appears similar as compared with the CT exam from 2017 and may reflect a benign lesion such as Warthin's tumors, tissue sampling is recommended for definitive diagnosis.    Pt is a smoker, 1/2ppd. States his voice has been more deep/hoarse the past year. No throat pain or trouble swallowing.     Pain: no  Otalgia: no  Odynophagia: no  Dysphagia: no  Dyspnea: no  Lumps in neck: yes  Hemoptysis: no  Fever: no  Chills: no  Sweats: no  Weight Loss: no    Patient Active Problem List   Diagnosis   ??? Personal history of other specified diseases(V13.89)   ??? Gastroesophageal reflux disease without esophagitis   ??? Abnormal liver enzymes   ??? Hiatal hernia   ??? 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 Ischemic stroke (CMS Dx)   ??? 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 Essential hypertension   ??? Type 2 diabetes mellitus with circulatory disorder (HCC)   ??? Chronic obstructive pulmonary disease (CMS Dx)   ??? Spondylosis of cervical region without myelopathy or radiculopathy   ??? Screen for colon cancer     Past Medical History:   Diagnosis Date   ??? COPD (chronic obstructive pulmonary disease) (CMS Dx)    ??? Diabetes mellitus (CMS Dx)    ??? Dysphagia    ??? GERD (gastroesophageal reflux disease)    ??? Hepatitis     ??? Hypertension      Past Surgical History:   Procedure Laterality Date   ??? ESOPHAGOGASTRODUODENOSCOPY N/A 11/19/2013    Procedure: ESOPHAGOGASTRODUODENOSCOPY WITH MAC;  Surgeon: Charlane Ferretti, MD;  Location: [facility] ENDOSCOPY;  Service: Gastroenterology;  Laterality: N/A;   ??? FOOT SURGERY Left    ??? HERNIA REPAIR      about 5 yrs ago     No family history on file.  Social History     Socioeconomic History   ??? Marital status: Legally Separated     Spouse name: Not on file   ??? Number of children: Not on file   ??? Years of education: Not on file   ??? Highest education level: Not on file   Occupational History   ??? Not on file   Social Needs   ??? Financial resource strain: Not on file   ??? Food insecurity:     Worry: Not on file     Inability: Not on file   ??? Transportation needs:     Medical: Not on file     Non-medical: Not on file   Tobacco Use   ??? Smoking status: Current Every Day Smoker     Packs/day: 0.25  Years: 5.00     Pack years: 1.25     Types: Cigarettes   ??? Smokeless tobacco: Never Used   Substance and Sexual Activity   ??? Alcohol use: No   ??? Drug use: No   ??? Sexual activity: Yes     Partners: Female     Birth control/protection: Condom   Lifestyle   ??? Physical activity:     Days per week: Not on file     Minutes per session: Not on file   ??? Stress: Not on file   Relationships   ??? Social connections:     Talks on phone: Not on file     Gets together: Not on file     Attends religious service: Not on file     Active member of club or organization: Not on file     Attends meetings of clubs or organizations: Not on file     Relationship status: Not on file   ??? Intimate partner violence:     Fear of current or ex partner: Not on file     Emotionally abused: Not on file     Physically abused: Not on file     Forced sexual activity: Not on file   Other Topics Concern   ??? Caffeine Use Not Asked   ??? Occupational Exposure Not Asked   ??? Exercise Not Asked   ??? Seat Belt Not Asked   Social History Narrative   ??? Not on  file     DRUG/FOOD ALLERGIES  Penicillins    CURRENT MEDICATIONS  Outpatient Encounter Medications as of 04/01/2018   Medication Sig Dispense Refill   ??? ALBUTEROL INHL Inhale into the lungs.     ??? amLODIPine (NORVASC) 10 MG tablet Take 10 mg by mouth daily     ??? aspirin 81 MG EC tablet Take 1 tablet (81 mg total) by mouth daily. 30 tablet 0   ??? atorvastatin (LIPITOR) 80 MG tablet Take 1 tablet (80 mg total) by mouth at bedtime. 30 tablet 0   ??? blood sugar diagnostic Strp Test daily     ??? blood-glucose meter Misc by Does not apply route.     ??? buPROPion SR (WELLBUTRIN SR) 100 MG tablet Take 100 mg by mouth 2 times daily Ran out of prescription     ??? entecavir (BARACLUDE) 0.5 MG tablet Take 1 tablet (0.5 mg total) by mouth daily. 30 tablet 2   ??? FLUoxetine (PROZAC) 20 MG capsule      ??? hydroCHLOROthiazide (MICROZIDE) 12.5 mg capsule Take 1 capsule (12.5 mg total) by mouth daily. 30 capsule 3   ??? ibuprofen (ADVIL,MOTRIN) 800 MG tablet Take 0.5 tablets (400 mg total) by mouth every 6 hours as needed for up to 16 doses. 16 tablet 0   ??? ketoconazole (NIZORAL) 2 % cream      ??? loratadine 10 mg Cap Take 1 tablet by mouth daily     ??? losartan (COZAAR) 100 MG tablet Take 1 tablet (100 mg total) by mouth daily. Take 100 mg by mouth daily 30 tablet 0   ??? metFORMIN (GLUCOPHAGE) 500 MG tablet Take 500 mg by mouth 2 times a day with meals.     ??? ondansetron (ZOFRAN ODT) 4 MG disintegrating tablet Take 1 tablet (4 mg total) by mouth every 6 hours as needed for Nausea. 16 tablet 0   ??? orphenadrine (NORFLEX) 100 mg tablet Take 1 tablet (100 mg total) by mouth 2 times a day.  10 tablet 0   ??? oxyCODONE (ROXICODONE) 5 MG immediate release tablet Take 1 tablet (5 mg total) by mouth every 4 hours as needed for Pain. 30 tablet 0   ??? oxyCODONE-acetaminophen (PERCOCET) 10-325 mg per tablet 1 tablet every 4 hours as needed.        ??? tamsulosin (FLOMAX) 0.4 mg Cp24 Take 0.4 mg by mouth daily     ??? traZODone (DESYREL) 100 MG tablet        No  facility-administered encounter medications on file as of 04/01/2018.      REVIEW OF SYSTEMS  See scanned Review of Systems sheet.    PHYSICAL EXAM  BP (!) 153/95    Pulse 98    Resp 16    Ht 5' 11 (1.803 m)    Wt 188 lb 3.2 oz (85.4 kg)    BMI 26.25 kg/m??     General Appearance: well-developed, well-nourished and in no acute distress   Communication: I was able to converse well with the patient. Patient was able to answer questions adequately and appropriately.     Head & Face (general): normal appearance   Face (palpation): no sinus tenderness   Salivary Glands (palpation): salivary glands NL size.   Voice Quality: Normal - Deep    Eyes:   External: No masses or abnormality  Extraoccular Muscle: intact     Nose:   External: external nose without infection or abnormality.     Ears:   External Ears: external ears are of normal appearance. No masses, lesions or scars.     House-Brackman Grading System for Facial Nerve Dysfunction:   (Left) Grade 1: Normal movement   (Right) Grade 1: Normal movement     Mouth:   Lip/teeth/gums: healthy dentition, lips, teeth and gums in good condition. no gingival inflammation, no labial lesions.   Mucosa: No leukoplakia or masses. Hard/soft palates and tongue of NL symmetry.   Tongue: normal midline, normal mucosa   Oropharynx/ Tonsils: pharyngeal walls and tonsillar fossae without abnormalities.   Hypopharynx: Gag   Larynx: Gag  Nasopharynx: Gag    Neck: Superficial well circumscribed 8mm cystic mass on the right angle of mandible; no other parotid masses palpated to correlate with MRI findings.  Neck Exam: no cervical, supraclavicular or auricular adenopathy   Thyroid Exam: no masses or fullness of thyroid     Respiratory Inspection:   Respiratory Effort: breathing comfortably, no increased work of breathing.     Cardiovascular:   Carotid arteries: pulses 2+, symmetric, no bruits     Skin:   Inspection: no lower extremity edema, rashes, lesions, or ulcerations, well developed,  turgor intact   Palpation: no subcutaneous nodules or induration     Musculoskeletal:   Gait and Station: intact without difficulty     Neurologic:   Cranial Nerves: II - XII grossly intact, upper extremities-normal strength, lower extremities-normal strength, deep tendon reflexes-normal, cerebellar exam-normal.     Mental Status:   Orientation: oriented to time, place, and person   Mood and affect: NL mood and affect. A+O x 4.      ASSESSMENT & PLAN    Parotid mass-  Will plan on return for biopsy of parotid mass at the tail, FNA,  Will plan on excising the superficial mass at the time of parotidectomy or if he defers on a separate in office occasion     Acquanetta Sit MD  Otolaryngology-Head and Neck Surgery  Head and Neck Oncologic and Microvascular Surgery  Cell:  [phone]  Pager: [phone]    Please never hesitate to contact with any questions or concern

## 2018-04-15 ENCOUNTER — Encounter

## 2018-06-09 NOTE — Unmapped (Signed)
Patient called and thought his appt with [facility] was for today.  It was rescheduled to 6/2.  I apologized profusely and he understood.  Patient agreed on the RS date of 6/2.

## 2018-06-10 ENCOUNTER — Encounter

## 2018-06-30 ENCOUNTER — Ambulatory Visit: Admit: 2018-06-30 | Discharge: 2018-06-30 | Payer: PRIVATE HEALTH INSURANCE

## 2018-06-30 DIAGNOSIS — K118 Other diseases of salivary glands: Secondary | ICD-10-CM

## 2018-06-30 NOTE — Unmapped (Signed)
[facility]  DEPARTMENT OF OTOLARYNGOLOGY  HEAD & NECK SURGERY CENTER    Patient Name: [name]  Medical Record Number:  [id number]  Primary Care Physician:  Danny Limes DORIOTT, DO  Date of Visit: 06/30/2018     CHIEF COMPLAINT  Parotid Mass and Follow-up    HISTORY OF PRESENT ILLNESS    [name] is a(n) 63 y.o. male who presents to ENT clinic for concern of a superficial mass over his parotid.       Pt had a MRI of the c-spine for chronic neck pain- incidentally a right parotid mass was found. 01/01/18- Multilobulated T2 hyperintense lesion within the right superficial parotid lobe. On this appears similar as compared with the CT exam from 2017 and may reflect a benign lesion such as Warthin's tumors, tissue sampling is recommended for definitive diagnosis.    Pt is a smoker, 1/2ppd. States his voice has been more deep/hoarse the past year. No throat pain or trouble swallowing.     Pain: no  Otalgia: no  Odynophagia: no  Dysphagia: no  Dyspnea: no  Lumps in neck: yes  Hemoptysis: no  Fever: no  Chills: no  Sweats: no  Weight Loss: no    Interval history:   Doing well, no changes, no neck swelling, pain or change in symptoms      Patient Active Problem List   Diagnosis   ??? Personal history of other specified diseases(V13.89)   ??? Gastroesophageal reflux disease without esophagitis   ??? Abnormal liver enzymes   ??? Hiatal hernia   ??? 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 Ischemic stroke (CMS Dx)   ??? 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 Essential hypertension   ??? Type 2 diabetes mellitus with circulatory disorder (HCC)   ??? Chronic obstructive pulmonary disease (CMS Dx)   ??? Spondylosis of cervical region without myelopathy or radiculopathy   ??? Screen for colon cancer     Past Medical History:   Diagnosis Date   ??? COPD (chronic obstructive pulmonary disease) (CMS Dx)    ???  Diabetes mellitus (CMS Dx)    ??? Dysphagia    ??? GERD (gastroesophageal reflux disease)    ??? Hepatitis    ??? Hypertension      Past Surgical History:   Procedure Laterality Date   ??? ESOPHAGOGASTRODUODENOSCOPY N/A 11/19/2013    Procedure: ESOPHAGOGASTRODUODENOSCOPY WITH MAC;  Surgeon: Charlane Ferretti, MD;  Location: [facility] ENDOSCOPY;  Service: Gastroenterology;  Laterality: N/A;   ??? FOOT SURGERY Left    ??? HERNIA REPAIR      about 5 yrs ago     History reviewed. No pertinent family history.  Social History     Socioeconomic History   ??? Marital status: Legally Separated     Spouse name: Not on file   ??? Number of children: Not on file   ??? Years of education: Not on file   ??? Highest education level: Not on file   Occupational History   ??? Not on file   Social Needs   ??? Financial resource strain: Not on file   ??? Food insecurity:     Worry: Not on file     Inability: Not on file   ??? Transportation needs:     Medical: Not on file     Non-medical: Not on  file   Tobacco Use   ??? Smoking status: Current Every Day Smoker     Packs/day: 0.25     Years: 5.00     Pack years: 1.25     Types: Cigarettes   ??? Smokeless tobacco: Never Used   Substance and Sexual Activity   ??? Alcohol use: No   ??? Drug use: No   ??? Sexual activity: Yes     Partners: Female     Birth control/protection: Condom   Lifestyle   ??? Physical activity:     Days per week: Not on file     Minutes per session: Not on file   ??? Stress: Not on file   Relationships   ??? Social connections:     Talks on phone: Not on file     Gets together: Not on file     Attends religious service: Not on file     Active member of club or organization: Not on file     Attends meetings of clubs or organizations: Not on file     Relationship status: Not on file   ??? Intimate partner violence:     Fear of current or ex partner: Not on file     Emotionally abused: Not on file     Physically abused: Not on file     Forced sexual activity: Not on file   Other Topics Concern   ??? Caffeine Use Not Asked    ??? Occupational Exposure Not Asked   ??? Exercise Not Asked   ??? Seat Belt Not Asked   Social History Narrative   ??? Not on file     DRUG/FOOD ALLERGIES  Penicillins    CURRENT MEDICATIONS  Outpatient Encounter Medications as of 06/30/2018   Medication Sig Dispense Refill   ??? ALBUTEROL INHL Inhale into the lungs.     ??? amLODIPine (NORVASC) 10 MG tablet Take 10 mg by mouth daily     ??? aspirin 81 MG EC tablet Take 1 tablet (81 mg total) by mouth daily. 30 tablet 0   ??? atorvastatin (LIPITOR) 80 MG tablet Take 1 tablet (80 mg total) by mouth at bedtime. 30 tablet 0   ??? blood sugar diagnostic Strp Test daily     ??? blood-glucose meter Misc by Does not apply route.     ??? buPROPion SR (WELLBUTRIN SR) 100 MG tablet Take 100 mg by mouth 2 times daily Ran out of prescription     ??? entecavir (BARACLUDE) 0.5 MG tablet Take 1 tablet (0.5 mg total) by mouth daily. 30 tablet 2   ??? hydroCHLOROthiazide (MICROZIDE) 12.5 mg capsule Take 1 capsule (12.5 mg total) by mouth daily. 30 capsule 3   ??? ketoconazole (NIZORAL) 2 % cream      ??? loratadine 10 mg Cap Take 1 tablet by mouth daily     ??? losartan (COZAAR) 100 MG tablet Take 1 tablet (100 mg total) by mouth daily. Take 100 mg by mouth daily 30 tablet 0   ??? ondansetron (ZOFRAN ODT) 4 MG disintegrating tablet Take 1 tablet (4 mg total) by mouth every 6 hours as needed for Nausea. 16 tablet 0   ??? orphenadrine (NORFLEX) 100 mg tablet Take 1 tablet (100 mg total) by mouth 2 times a day. 10 tablet 0   ??? oxyCODONE (ROXICODONE) 5 MG immediate release tablet Take 1 tablet (5 mg total) by mouth every 4 hours as needed for Pain. 30 tablet 0   ??? tamsulosin (FLOMAX) 0.4  mg Cp24 Take 0.4 mg by mouth daily     ??? FLUoxetine (PROZAC) 20 MG capsule      ??? ibuprofen (ADVIL,MOTRIN) 800 MG tablet Take 0.5 tablets (400 mg total) by mouth every 6 hours as needed for up to 16 doses. 16 tablet 0   ??? metFORMIN (GLUCOPHAGE) 500 MG tablet Take 500 mg by mouth 2 times a day with meals.     ??? oxyCODONE-acetaminophen  (PERCOCET) 10-325 mg per tablet 1 tablet every 4 hours as needed.        ??? traZODone (DESYREL) 100 MG tablet        No facility-administered encounter medications on file as of 06/30/2018.      REVIEW OF SYSTEMS  See scanned Review of Systems sheet.    PHYSICAL EXAM  BP 138/89 (BP Location: Right arm)    Pulse 78    Temp 97 ??F (36.1 ??C) (Temporal)    Resp 18    Ht 5' 11 (1.803 m)    Wt 190 lb 14.4 oz (86.6 kg)    SpO2 99%    BMI 26.63 kg/m??     General Appearance: well-developed, well-nourished and in no acute distress   Communication: I was able to converse well with the patient. Patient was able to answer questions adequately and appropriately.     Head & Face (general): normal appearance   Face (palpation): no sinus tenderness   Salivary Glands (palpation): salivary glands NL size.   Voice Quality: Normal - Deep    Eyes:   External: No masses or abnormality  Extraoccular Muscle: intact     Nose:   External: external nose without infection or abnormality.     Ears:   External Ears: external ears are of normal appearance. No masses, lesions or scars.     House-Brackman Grading System for Facial Nerve Dysfunction:   (Left) Grade 1: Normal movement   (Right) Grade 1: Normal movement     Mouth:   Lip/teeth/gums: healthy dentition, lips, teeth and gums in good condition. no gingival inflammation, no labial lesions.   Mucosa: No leukoplakia or masses. Hard/soft palates and tongue of NL symmetry.   Tongue: normal midline, normal mucosa   Oropharynx/ Tonsils: pharyngeal walls and tonsillar fossae without abnormalities.   Hypopharynx: Gag   Larynx: Gag  Nasopharynx: Gag    Neck: Superficial well circumscribed 8mm cystic mass on the right angle of mandible; no other parotid masses palpated to correlate with MRI findings.  Neck Exam: no cervical, supraclavicular or auricular adenopathy   Thyroid Exam: no masses or fullness of thyroid     Respiratory Inspection:   Respiratory Effort: breathing comfortably, no increased work  of breathing.     Cardiovascular:   Carotid arteries: pulses 2+, symmetric, no bruits     Skin:   Inspection: no lower extremity edema, rashes, lesions, or ulcerations, well developed, turgor intact   Palpation: no subcutaneous nodules or induration     Musculoskeletal:   Gait and Station: intact without difficulty     Neurologic:   Cranial Nerves: II - XII grossly intact, upper extremities-normal strength, lower extremities-normal strength, deep tendon reflexes-normal, cerebellar exam-normal.     Mental Status:   Orientation: oriented to time, place, and person   Mood and affect: NL mood and affect. A+O x 4.    Procedure:   Ultrasounds of Neck   Surgeon:   Acquanetta Sit    Indications:   Right parotid tail Mass   Description:  After verbal informed consent, the patient was placed in the exam chair with the head of bed reclined at 30 degrees.     B-mode ultrasound was used to evaluate the right parotid   Findings:    ?? There is the superficial cystic mass within the subcutaneous tissue. Upon close inspection of the parotid tail where there was some abnormality on the MRI, no abnormal masses or tissues could be identified to biopsy. There were a few small lymph nodes with normal fatty hilums         ASSESSMENT & PLAN    Parotid mass-  Nothing suspicious seen on ultrasound today to biopsy. Had a long discussion about options and will proceed with a repeat ultrasound and possible biopsy in 3-4 months. Will excise the subcutaneous nodule at that time. Follow up earlier with any change in symptoms.      Acquanetta Sit MD  Otolaryngology-Head and Neck Surgery  Head and Neck Oncologic and Microvascular Surgery  Cell: [phone]  Pager: [phone]    Please never hesitate to contact with any questions or concern

## 2018-07-20 ENCOUNTER — Emergency Department: Admit: 2018-07-20 | Payer: PRIVATE HEALTH INSURANCE

## 2018-07-20 ENCOUNTER — Emergency Department: Payer: PRIVATE HEALTH INSURANCE

## 2018-07-20 ENCOUNTER — Inpatient Hospital Stay
Admit: 2018-07-20 | Discharge: 2018-07-21 | Disposition: A | Discharge status: Home / Self Care | Payer: PRIVATE HEALTH INSURANCE

## 2018-07-20 DIAGNOSIS — R51 Headache: Secondary | ICD-10-CM

## 2018-07-20 LAB — BASIC METABOLIC PANEL
Anion Gap: 7 mmol/L (ref 3–16)
BUN: 14 mg/dL (ref 7–25)
CO2: 27 mmol/L (ref 21–33)
Calcium: 9.3 mg/dL (ref 8.6–10.3)
Chloride: 104 mmol/L (ref 98–110)
Creatinine: 1.15 mg/dL (ref 0.60–1.30)
Glucose: 90 mg/dL (ref 70–100)
Osmolality, Calculated: 286 mOsm/kg (ref 278–305)
Potassium: 3.6 mmol/L (ref 3.5–5.3)
Sodium: 138 mmol/L (ref 133–146)
eGFR AA CKD-EPI: 78 See note.
eGFR NONAA CKD-EPI: 67 See note.

## 2018-07-20 LAB — CBC
Hematocrit: 43.8 % (ref 38.5–50.0)
Hemoglobin: 15 g/dL (ref 13.2–17.1)
MCH: 32.5 pg (ref 27.0–33.0)
MCHC: 34.3 g/dL (ref 32.0–36.0)
MCV: 94.6 fL (ref 80.0–100.0)
MPV: 9.5 fL (ref 7.5–11.5)
Platelets: 172 10*3/uL (ref 140–400)
RBC: 4.63 10*6/uL (ref 4.20–5.80)
RDW: 14.2 % (ref 11.0–15.0)
WBC: 7.3 10*3/uL (ref 3.8–10.8)

## 2018-07-20 LAB — DIFFERENTIAL
Basophils Absolute: 44 /uL (ref 0–200)
Basophils Relative: 0.6 % (ref 0.0–1.0)
Eosinophils Absolute: 241 /uL (ref 15–500)
Eosinophils Relative: 3.3 % (ref 0.0–8.0)
Lymphocytes Absolute: 2570 /uL (ref 850–3900)
Lymphocytes Relative: 35.2 % (ref 15.0–45.0)
Monocytes Absolute: 584 /uL (ref 200–950)
Monocytes Relative: 8 % (ref 0.0–12.0)
Neutrophils Absolute: 3862 /uL (ref 1500–7800)
Neutrophils Relative: 52.9 % (ref 40.0–80.0)
nRBC: 0 /100 WBC (ref 0–0)

## 2018-07-20 LAB — HEPATIC FUNCTION PANEL
ALT: 9 U/L (ref 7–52)
AST: 13 U/L (ref 13–39)
Albumin: 4.2 g/dL (ref 3.5–5.7)
Alkaline Phosphatase: 92 U/L (ref 36–125)
Bilirubin, Direct: 0.2 mg/dL (ref 0.0–0.4)
Bilirubin, Indirect: 0.5 mg/dL (ref 0.0–1.1)
Total Bilirubin: 0.7 mg/dL (ref 0.0–1.5)
Total Protein: 7.3 g/dL (ref 6.4–8.9)

## 2018-07-20 LAB — TROPONIN I: Troponin I: <0.04 ng/mL (ref 0.00–0.03)

## 2018-07-20 MED ORDER — proCHLORPERazine (COMPAZINE) injection Soln 10 mg
10 | Freq: Once | INTRAMUSCULAR | Status: AC
Start: 2018-07-20 — End: 2018-07-20
  Administered 2018-07-21: 10 mg via INTRAVENOUS

## 2018-07-20 MED ORDER — diphenhydrAMINE (BENADRYL) injection 25 mg
50 | Freq: Once | INTRAMUSCULAR | Status: AC
Start: 2018-07-20 — End: 2018-07-20
  Administered 2018-07-21: 25 mg via INTRAVENOUS

## 2018-07-20 MED FILL — DIPHENHYDRAMINE 50 MG/ML INJECTION SOLUTION: 50 50 mg/mL | INTRAMUSCULAR | Qty: 1

## 2018-07-20 MED FILL — PROCHLORPERAZINE EDISYLATE 10 MG/2 ML (5 MG/ML) INJECTION SOLUTION: 10 10 mg/2 mL (5 mg/mL) | INTRAMUSCULAR | Qty: 2

## 2018-07-20 NOTE — Unmapped (Signed)
HA x 4 hrs, no interventions, feels like his b/p is high, no n/v, no vision problems, noise sensitivity, no photo problems, grips =, no pronator drift noted, had a stroke 2017,

## 2018-07-20 NOTE — Unmapped (Signed)
Fell last pm going up the steps and skinned his knees,

## 2018-07-20 NOTE — Unmapped (Signed)
Phlebotomy down to obtain lab work per order.

## 2018-07-20 NOTE — Unmapped (Signed)
ED Attending Attestation Note    Date of service:  07/20/2018    This patient was seen by the resident physician.  I have seen and examined the patient, agree with the workup, evaluation, management and diagnosis. The care plan has been discussed and I concur.     My assessment reveals a 63 y.o. male intermittent frontal low-level headache over the past month.  No change in character or acuity.  Also noted some elevated blood pressures over the same time.  Otherwise without complaints.  Agrees to contact primary care physician for reassessment of blood pressure medication.

## 2018-07-20 NOTE — Unmapped (Signed)
Discharge instructions and follow-up discussed with patient. Patient has no further questions related to discharge.

## 2018-07-20 NOTE — Unmapped (Signed)
Lab Collection Status: Patient labs collected per protocol  Nurse notified:

## 2018-07-20 NOTE — Unmapped (Signed)
Dear [name],    You were seen for Hypertension and Headache      Please follow-up with your primary care physician in 5-7 days. If you do not have a primary care physician please contact your insurance provider to establish care with a new physician or call [phone] to establish care here, with [facility] located at [address] [facility].    In addition, return if:  - You develop a fever (greater than 101 F)   - You have chest pain, shortness of breath, abdominal pain, or uncontrollable vomiting  - You are unable to eat or drink  - You pass out  - You have difficulty moving your arms or legs    - You have new numbness  - You have difficulty speaking or slurred speech  - Or you have any other concerns    It was an absolute pleasure taking care of you!

## 2018-07-20 NOTE — Unmapped (Signed)
[facility] ED Note    Date of Service: 07/20/2018      Reason for Visit: Hypertension and Headache      Patient History     HPI:  [name] is a 63 y.o. male with the PMHx listed below, notable for COPD, diabetes presenting with elevated blood pressure and diffuse headache.  Headache is been coming and going over the past month and today has been going on for 4 hours.  It is throbbing.  No nausea vomiting vision changes photophobia or phonophobia.  Denies any numbness weakness or tingling.  Headache was not maximal in onset slowly got worse and slowly got better.  Currently does very mild.  Denies any fevers or chills.  Had a Foley of day wear his walking up the stairs and landed on his left knee.  No head injuries at the time      With the exception of above, the patient denies any aggravating or alleviating factors as well as any other associated signs or symptoms.    Past Medical History:   Diagnosis Date   ??? COPD (chronic obstructive pulmonary disease) (CMS Dx)    ??? Diabetes mellitus (CMS Dx)    ??? Dysphagia    ??? GERD (gastroesophageal reflux disease)    ??? Hepatitis    ??? Hypertension        Past Surgical History:   Procedure Laterality Date   ??? ESOPHAGOGASTRODUODENOSCOPY N/A 11/19/2013    Procedure: ESOPHAGOGASTRODUODENOSCOPY WITH MAC;  Surgeon: Charlane Ferretti, MD;  Location: [facility] ENDOSCOPY;  Service: Gastroenterology;  Laterality: N/A;   ??? FOOT SURGERY Left    ??? HERNIA REPAIR      about 5 yrs ago       [name]  reports that he has been smoking cigarettes. He has a 1.25 pack-year smoking history. He has never used smokeless tobacco. He reports that he does not drink alcohol or use drugs.    Previous Medications    ALBUTEROL INHL    Inhale into the lungs.    AMLODIPINE (NORVASC) 10 MG TABLET    Take 10 mg by mouth daily    ASPIRIN 81 MG EC TABLET    Take 1 tablet (81 mg total) by mouth daily.    ATORVASTATIN (LIPITOR) 80 MG TABLET    Take 1 tablet (80 mg total) by mouth at bedtime.    BLOOD SUGAR  DIAGNOSTIC STRP    Test daily    BLOOD-GLUCOSE METER MISC    by Does not apply route.    BUPROPION SR (WELLBUTRIN SR) 100 MG TABLET    Take 100 mg by mouth 2 times daily Ran out of prescription    ENTECAVIR (BARACLUDE) 0.5 MG TABLET    Take 1 tablet (0.5 mg total) by mouth daily.    FLUOXETINE (PROZAC) 20 MG CAPSULE        HYDROCHLOROTHIAZIDE (MICROZIDE) 12.5 MG CAPSULE    Take 1 capsule (12.5 mg total) by mouth daily.    IBUPROFEN (ADVIL,MOTRIN) 800 MG TABLET    Take 0.5 tablets (400 mg total) by mouth every 6 hours as needed for up to 16 doses.    KETOCONAZOLE (NIZORAL) 2 % CREAM        LORATADINE 10 MG CAP    Take 1 tablet by mouth daily    LOSARTAN (COZAAR) 100 MG TABLET    Take 1 tablet (100 mg total) by mouth daily. Take 100 mg by mouth daily    METFORMIN (  GLUCOPHAGE) 500 MG TABLET    Take 500 mg by mouth 2 times a day with meals.    ONDANSETRON (ZOFRAN ODT) 4 MG DISINTEGRATING TABLET    Take 1 tablet (4 mg total) by mouth every 6 hours as needed for Nausea.    ORPHENADRINE (NORFLEX) 100 MG TABLET    Take 1 tablet (100 mg total) by mouth 2 times a day.    OXYCODONE (ROXICODONE) 5 MG IMMEDIATE RELEASE TABLET    Take 1 tablet (5 mg total) by mouth every 4 hours as needed for Pain.    OXYCODONE-ACETAMINOPHEN (PERCOCET) 10-325 MG PER TABLET    1 tablet every 4 hours as needed.       TAMSULOSIN (FLOMAX) 0.4 MG CP24    Take 0.4 mg by mouth daily    TRAZODONE (DESYREL) 100 MG TABLET           Allergies:   Allergies as of 07/20/2018 - Fully Reviewed 07/20/2018   Allergen Reaction Noted   ??? Penicillins Hives 04/22/2013       Nursing notes reviewed.    Review of Systems     ROS: Pertinent positive and negative findings as documented in the HPI otherwise all other systems were reviewed and were negative    Physical Exam     Vitals:    07/20/18 1730 07/20/18 1900   BP: 177/90 (!) 152/96   BP Location: Right arm    Patient Position: Sitting    Pulse: 88 75   Resp: 16 21   Temp: 98.7 ??F (37.1 ??C)    TempSrc: Oral    SpO2:  96% 100%         General:  Well appearing and resting comfortably. No acute distress.   Eyes:  PERRLA. EOMI. No discharge from eyes.   ENT:  No discharge from nose. OP clear. No tonsillar exudates.  Neck:  Supple without lymphadenopathy.   Pulmonary:   Non-labored breathing. Lungs CTAB.   Cardiac:  Regular rate and rhythm. No murmurs, rubs, or gallops.   Abdomen:  Soft. Non-tender. Non-distended. No rigidity/rebound/guarding  Musculoskeletal:  No long bone deformity.  No CVA tenderness.   Vascular:  Extremities warm and perfused. Capillary refill <2seconds.   Skin:  No rash.   Neuro: Alert and oriented x 4. CN II-XII intact. 5/5 strength in all 4 distal extremities. Sensation grossly intact to light touch. Speech and mentation normal.   Extremities:  No peripheral edema.       Diagnostic Studies     Labs:    Please see patient chart for any labs ordered during this encounter.     Radiology:    Please see patient chart for any imaging ordered during this encounter.       Emergency Department Procedures         ED Course and MDM   [name] is a 63 y.o. male who presented to the emergency department with Hypertension and Headache   with a history and presentation as described above in HPI. The patient was evaluated by myself and the ED Attending Physician listed above. All management and disposition plans were discussed and agreed upon. Furthermore, a thorough chart review was performed during this encounter.       Vital signs on arrival: Hypertensive otherwise unremarkable    Based on our evaluation today, we believe this is most likely a benign headache. The patient has a nonfocal neurologic exam. My suspicion for serious pathology is low given a lack  of significant risk factors and a reassuring history and physical exam. Specifically, the patient's history and exam is not consistent with subarachnoid hemorrhage. There is no meningismus or fever to suggest meningitis or other infectious etiology (like  sinusitis or mastoiditis). The patient does not have any signs of alteration in mental status to suggest encephalitis. There is no history of trauma to suggest intracranial bleeding. We have low suspicion of intracranial mass based on history.   Given the lack of reproducibility, vision changes, or jaw claudication  I do not suspect temporal arteritis. No vision changes or eye pain to suggest glaucoma. Given the history of the headache my suspicion for carbon monoxide poisoning is low.     The patient was treated  compazine 10mg  IV which lead to significant improvement in their headache.    This was discussed with the patient and all questions were addressed and answered. The patient agrees with the plan, understands reasons to return to the ED and will follow-up as indicated.      The patient was reassessed prior to final disposition.     Summary of therapeutics provided in the emergency department:  Medications   proCHLORPERazine (COMPAZINE) injection Soln 10 mg (10 mg Intravenous Given 07/20/18 2001)   diphenhydrAMINE (BENADRYL) injection 25 mg (25 mg Intravenous Given 07/20/18 2001)       Risks, benefits, and alternatives were discussed. At this time the patient has been deemed safe for discharge. My customary discharge instructions including strict return precautions for worsening or new symptoms have been communicated.    Impression     1. Nonintractable headache, unspecified chronicity pattern, unspecified headache type           Plan     1. The patient is to be discharged in stable/improved condition.2. Workup, treatment and diagnosis were discussed with the patient and/or family members; the patient agrees to the plan and all questions were addressed and answered.3. The patient is instructed to return to the emergency department should his symptoms worsen or any concern he believes warrants acute physician evaluation.      No future appointments.      Lanell Matar, MD, PGY3  [facility]    This  note was dictated using voice-recognition software, which occasionally construes inadvertent typographic errors.     Lanell Matar  Resident  07/20/18 2032

## 2018-09-03 ENCOUNTER — Encounter: Attending: Surgery | Primary: Family Medicine

## 2018-09-09 ENCOUNTER — Inpatient Hospital Stay
Admit: 2018-09-09 | Discharge: 2018-09-09 | Disposition: A | Discharge status: Home / Self Care | Payer: PRIVATE HEALTH INSURANCE | Attending: Emergency Medicine

## 2018-09-09 ENCOUNTER — Emergency Department: Admit: 2018-09-09 | Payer: PRIVATE HEALTH INSURANCE | Primary: Family Medicine

## 2018-09-09 DIAGNOSIS — H9209 Otalgia, unspecified ear: Secondary | ICD-10-CM

## 2018-09-09 LAB — EKG 12-LEAD
Atrial Rate: 83 {beats}/min
P Axis: 67 degrees
P-R Interval: 148 ms
Q-T Interval: 396 ms
QRS Duration: 90 ms
QTc Calculation (Bazett): 465 ms
R Axis: 65 degrees
T Axis: 86 degrees
Ventricular Rate: 83 {beats}/min

## 2018-09-09 LAB — COMPREHENSIVE METABOLIC PANEL W/ REFLEX TO MG FOR LOW K
ALT: 9 U/L — ABNORMAL LOW (ref 10–40)
AST: 12 U/L — ABNORMAL LOW (ref 15–37)
Albumin/Globulin Ratio: 1.6 (ref 1.1–2.2)
Albumin: 4.2 g/dL (ref 3.4–5.0)
Alkaline Phosphatase: 78 U/L (ref 40–129)
Anion Gap: 13 (ref 3–16)
BUN: 13 mg/dL (ref 7–20)
CO2: 23 mmol/L (ref 21–32)
Calcium: 9 mg/dL (ref 8.3–10.6)
Chloride: 105 mmol/L (ref 99–110)
Creatinine: 1.1 mg/dL (ref 0.8–1.3)
GFR African American: >60 (ref >60)
GFR Non-African American: >60 (ref >60)
Globulin: 2.7 g/dL
Glucose: 104 mg/dL — ABNORMAL HIGH (ref 70–99)
Potassium reflex Magnesium: 3.5 mmol/L (ref 3.5–5.1)
Sodium: 141 mmol/L (ref 136–145)
Total Bilirubin: 0.9 mg/dL (ref 0.0–1.0)
Total Protein: 6.9 g/dL (ref 6.4–8.2)

## 2018-09-09 LAB — CBC WITH AUTO DIFFERENTIAL
Basophils %: 0.7 %
Basophils Absolute: 0 10*3/uL (ref 0.0–0.2)
Eosinophils %: 1.3 %
Eosinophils Absolute: 0.1 10*3/uL (ref 0.0–0.6)
Hematocrit: 43.4 % (ref 40.5–52.5)
Hemoglobin: 14.5 g/dL (ref 13.5–17.5)
Lymphocytes %: 30.7 %
Lymphocytes Absolute: 2 10*3/uL (ref 1.0–5.1)
MCH: 31.6 pg (ref 26.0–34.0)
MCHC: 33.5 g/dL (ref 31.0–36.0)
MCV: 94.2 fL (ref 80.0–100.0)
MPV: 9.2 fL (ref 5.0–10.5)
Monocytes %: 8.3 %
Monocytes Absolute: 0.5 10*3/uL (ref 0.0–1.3)
Neutrophils %: 59 %
Neutrophils Absolute: 3.9 10*3/uL (ref 1.7–7.7)
Platelets: 185 10*3/uL (ref 135–450)
RBC: 4.61 M/uL (ref 4.20–5.90)
RDW: 14.4 % (ref 12.4–15.4)
WBC: 6.6 10*3/uL (ref 4.0–11.0)

## 2018-09-09 LAB — POCT GLUCOSE
Glucose: 101 mg/dL
POC Glucose: 101 mg/dl — ABNORMAL HIGH (ref 70–99)

## 2018-09-09 LAB — PROTIME-INR
INR: 1 (ref 0.86–1.14)
Protime: 11.6 s (ref 10.0–13.2)

## 2018-09-09 LAB — TROPONIN: Troponin: <0.01 ng/mL (ref <0.01)

## 2018-09-09 LAB — MAGNESIUM: Magnesium: 2.2 mg/dL (ref 1.80–2.40)

## 2018-09-09 MED ORDER — IOPAMIDOL 76 % IV SOLN
76 % | Freq: Once | INTRAVENOUS | Status: AC | PRN
Start: 2018-09-09 — End: 2018-09-09
  Administered 2018-09-09: 18:00:00 75 mL via INTRAVENOUS

## 2018-09-09 MED ORDER — CEFDINIR 300 MG PO CAPS
300 MG | ORAL_CAPSULE | Freq: Two times a day (BID) | ORAL | 0 refills | Status: AC
Start: 2018-09-09 — End: 2018-09-19

## 2018-09-09 NOTE — ED Provider Notes (Signed)
Primary Care Physician: Hendricks Limes Doriott, DO   Attending Physician: No att. providers found     History   Chief Complaint   Patient presents with   . Numbness     right side feeling numb, face,arm and leg         HPI   [name] Peck is a 63 y.o. male with history of COPD, chronic back pain, anxiety, depression, diabetes, hypertension, hyperlipidemia, stroke 3 years ago presenting this morning coming from work with complaint of right-sided numbness that started around 1230 while at work.  He states that during his last stroke there was no residual deficits but now he can feel that the right side is numb than usual.  Apart from the numbness he denies any weakness, facial droop, slurred speech, visual change or diplopia.  No fevers, chills, nausea, vomiting, chest pain, shortness of breath or exposures to persons infected with coronavirus.     Past Medical History:   Diagnosis Date   . Anxiety and depression    . Cervical pain (neck)    . Chronic back pain    . COPD (chronic obstructive pulmonary disease) (HCC)    . Degenerative cervical disc 03/12/2010   . Diabetes mellitus (HCC)    . GERD (gastroesophageal reflux disease) 03/29/2010   . Hyperlipidemia 03/21/2010   . Hypertension    . Obstructive sleep apnea 05/16/2010   . Sleep apnea    . Tobacco abuse         Past Surgical History:   Procedure Laterality Date   . BRONCHOSCOPY  09/23/13   . COLONOSCOPY     . FOOT SURGERY  08-05-13    RIGHT [place]/AKIN    . HERNIA REPAIR  08/2009   . TOE SURGERY  06-04-12    ARTHROPLASTY 4TH AND 5TH        Family History   Problem Relation Age of Onset   . High Blood Pressure Mother         Social History     Socioeconomic History   . Marital status: Married     Spouse name: None   . Number of children: None   . Years of education: None   . Highest education level: None   Occupational History   . None   Social Needs   . Financial resource strain: None   . Food insecurity     Worry: None     Inability: None   . Transportation needs      Medical: None     Non-medical: None   Tobacco Use   . Smoking status: Current Every Day Smoker     Packs/day: 0.50     Years: 40.00     Pack years: 20.00     Types: Cigarettes   . Smokeless tobacco: Never Used   . Tobacco comment: Cutting down, now smoking 10 cigarettes a day   Substance and Sexual Activity   . Alcohol use: No     Alcohol/week: 0.0 standard drinks   . Drug use: No   . Sexual activity: None   Lifestyle   . Physical activity     Days per week: None     Minutes per session: None   . Stress: None   Relationships   . Social Wellsite geologist on phone: None     Gets together: None     Attends religious service: None     Active member of club or organization: None  Attends meetings of clubs or organizations: None     Relationship status: None   . Intimate partner violence     Fear of current or ex partner: None     Emotionally abused: None     Physically abused: None     Forced sexual activity: None   Other Topics Concern   . None   Social History Narrative   . None        Review of Systems   10 total systems reviewed and found to be negative unless otherwise noted in HPI     Physical Exam   BP (!) 153/106   Pulse 74   Temp 97.6 F (36.4 C) (Oral)   Resp 17   Ht 5\' 11"  (1.803 m)   Wt 199 lb 4.7 oz (90.4 kg)   SpO2 99%   BMI 27.80 kg/m      CONSTITUTIONAL: Well appearing, in no acute distress   HEAD: atraumatic, normocephalic   EYES: PERRL, No injection, discharge or scleral icterus.   ENT: Moist mucous membranes but some facial asymmetry.    NECK: Normal ROM, NO LAD   CARDIOVASCULAR: Regular rate and rhythm. No murmurs or gallop.   PULMONARY/CHEST: Airway patent. No retractions. Breath sounds clear with good air entry bilaterally.   ABDOMEN: Soft, Non-distended and non-tender, without guarding or rebound.   SKIN: Acyanotic, warm, dry   MUSCULOSKELETAL: No swelling, tenderness or deformity   NEUROLOGICAL: Awake and oriented x 3. Pulses intact. Grossly nonfocal decreased sensation on the  right side  Nursing note and vitals reviewed.                                                                                                                     NIH Stroke Scale     Time: 7:29 AM   Person Administering Scale: Lamiyah Schlotter A Nylan Nakatani, MD     Level of consciousness: [0]   0 = Alert;   1 = Not alert;   2 = Not alert;   3 = Responds only with reflex motor or autonomic effects or totally unresponsive, flaccid, and flexic.     LOC questions: [0]   0 = Answers both questions correctly.   1 = Answers one question correctly.   2 = Answers neither question correctly.     LOC commands: [0]   0 = Performs both tasks correctly.   1 = Performs one task correctly.   2 = Performs neither task correctly.     Best Gaze: [ 0 ]   0 = Normal   1 = Partial gaze palsy   2 = Forced deviation     Visual: [0]   0 = No visual loss   1 = Partial hemianopia   2 = Complete hemianopia   3 = Bilateral hemianopia     Facial Palsy: [1]   0 = Normal   1 = Minor paralysis   2 = Partial paralysis   3 = Complete paralysis  Motor left arm: [0]   0 = No drift;   1 = Drift   2 = Some effort against gravity;   3 = No effort against gravity;   4 = No movement.   UN = Amputation     Motor right arm: [0]   0 = No drift   1 = Drift   2 = Some effort against gravity   3 = No effort against gravity   4 = No movement   UN = Amputation     Motor left leg: [0]   0 = No drift   1.= Drift   2 = Some effort against gravity   3 = No effort against gravity   4 = No movement.   UN = Amputation     Motor right leg: [0]   0 = No drift   1 = Drift   2 = Some effort against gravity   3 = No effort against gravity   4 = No movement   UN = Amputation     Limb Ataxia: [0]   0 = Absent.   1 = Present in one limb.   2 = Present in two limbs   UN = Amputation     Sensory: [0]   0 = Absent   1.= Present in one limb   2 = Present in two limbs   UN = Amputation     Best Language: [0]   0 = No aphasia   1 = Mild-to-moderate aphasia   2 = Severe aphasia   3 = Mute,  global aphasia     Dysarthria: [0]   1 = Mild-to-moderate dysarthria   2 = Severe dysarthria   UN = Intubated     Extinction and Inattention: [0]   0 = No abnormality.   1 = Visual, tactile, auditory, spatial, or personal inattention   2 = Profound hemi-inattention or extinction to more than one modality     TOTAL: Ashleigh.Hinds ]     ED Course & Medical Decision Making   Medications   iopamidol (ISOVUE-370) 76 % injection 75 mL (75 mLs Intravenous Given 09/09/18 1334)      Labs Reviewed   COMPREHENSIVE METABOLIC PANEL W/ REFLEX TO MG FOR LOW K - Abnormal; Notable for the following components:       Result Value    Glucose 104 (*)     ALT 9 (*)     AST 12 (*)     All other components within normal limits    Narrative:     Performed at:  [facility]  [address] [place], [place] [address]   Phone [phone]   POCT GLUCOSE - Abnormal; Notable for the following components:    POC Glucose 101 (*)     All other components within normal limits    Narrative:     Performed at:  [facility]  [address] [place], [place] [address]   Phone [phone]   POCT GLUCOSE - Normal   CBC WITH AUTO DIFFERENTIAL    Narrative:     Performed at:  [facility]  [address] [place], [place] [address]   Phone [phone]   TROPONIN    Narrative:     Performed at:  [facility]  [address] [place], [place] [address]  Phone [phone]   PROTIME-INR    Narrative:     Performed at:  [facility]  [address] [place], [place] [address]   Phone [phone]   MAGNESIUM    Narrative:     Performed at:  [facility]  [address] [place], [place] [address]   Phone [phone]      XR CHEST PORTABLE   Final Result   1.  No acute abnormality.         CTA HEAD NECK W CONTRAST   Final Result   1. No acute arterial abnormality or  hemodynamically significant arterial   stenosis in the head or neck.   2. Emphysema.         CT HEAD WO CONTRAST   Final Result   No acute intracranial abnormality.      Bilateral mastoid effusions.  Acute component of inflammation is considered   on the right side.      Critical results were called by Dr. Lynden Oxford, MD to Lakeem Rozo A   Jocelynne Duquette on 09/09/2018 at 13:46.            Ct Head Wo Contrast Result Date: 09/09/2018  EXAMINATION: CT OF THE HEAD WITHOUT CONTRAST  09/09/2018 1:25 pm TECHNIQUE: CT of the head was performed without the administration of intravenous contrast. Dose modulation, iterative reconstruction, and/or weight based adjustment of the mA/kV was utilized to reduce the radiation dose to as low as reasonably achievable. COMPARISON: 07/15/2016 HISTORY: ORDERING SYSTEM PROVIDED HISTORY: stroke TECHNOLOGIST PROVIDED HISTORY: Has a "code stroke" or "stroke alert" been called?->Yes Reason for exam:->stroke Reason for Exam: right arm/face numbness-code stroke Acuity: Acute Type of Exam: Initial FINDINGS: BRAIN/VENTRICLES: There is no acute intracranial hemorrhage, mass effect or midline shift.  No abnormal extra-axial fluid collection.  The gray-white differentiation is maintained without evidence of an acute infarct.  There is no evidence of hydrocephalus. There is subtle, scattered low-attenuation in the periventricular and subcortical white matter, nonspecific, consistent with small vessel disease. This appears stable in the interval. ORBITS: The visualized portion of the orbits demonstrate no acute abnormality. SINUSES: There is mild inflammatory disease of the maxillary sinuses.  There are bilateral mastoid effusions, greater on the right and stable on the left since 2018. SOFT TISSUES/SKULL:  No acute abnormality of the visualized skull or soft tissues.     No acute intracranial abnormality. Bilateral mastoid effusions.  Acute component of inflammation is considered on the right side. Critical  results were called by Dr. Lynden Oxford, MD to Takyah Ciaramitaro A Cabe Lashley on 09/09/2018 at 13:46.     Xr Chest Portable Result Date: 09/09/2018  EXAMINATION: ONE XRAY VIEW OF THE CHEST 09/09/2018 1:39 pm COMPARISON: 07/15/2016 HISTORY: ORDERING SYSTEM PROVIDED HISTORY: sytoke TECHNOLOGIST PROVIDED HISTORY: Reason for exam:->sytoke Reason for Exam: poss stroke FINDINGS: There is bibasilar scarring.  Calcified granulomatous disease is noted.  The cardiac silhouette is within normal limits.  There is no pneumothorax or pleural effusion.     1.  No acute abnormality.     Cta Head Neck W Contrast Result Date: 09/09/2018  EXAMINATION: CTA OF THE HEAD AND NECK WITH CONTRAST 09/09/2018 1:28 pm: TECHNIQUE: CTA of the head and neck was performed with the administration of intravenous contrast. Multiplanar reformatted images are provided for review.  MIP images are provided for review. Stenosis of the internal carotid arteries measured using NASCET criteria. Dose modulation, iterative reconstruction,  and/or weight based adjustment of the mA/kV was utilized to reduce the radiation dose to as low as reasonably achievable. COMPARISON: None. HISTORY: ORDERING SYSTEM PROVIDED HISTORY: stroke TECHNOLOGIST PROVIDED HISTORY: Reason for exam:->stroke Reason for Exam: right arm/face numbness-code stroke Acuity: Acute Type of Exam: Initial FINDINGS: CTA NECK: AORTIC ARCH/ARCH VESSELS: No dissection or arterial injury.  No significant stenosis of the brachiocephalic or subclavian arteries. CAROTID ARTERIES: No dissection, arterial injury, or hemodynamically significant stenosis by NASCET criteria.  Minimal atherosclerotic plaque at the right carotid bifurcation. VERTEBRAL ARTERIES: No dissection, arterial injury, or significant stenosis. SOFT TISSUES: There is mild-to-moderate centrilobular emphysema in the visualized upper lungs.  Chronic calcified right upper lobe granuloma is noted.  No cervical or superior mediastinal lymphadenopathy.  The  larynx and pharynx are unremarkable.  No acute abnormality of the salivary and thyroid glands. BONES: There is no acute fracture or suspect osseous lesion.  There are mild degenerative changes in the cervical spine. CTA HEAD: ANTERIOR CIRCULATION: No significant stenosis of the intracranial internal carotid, anterior cerebral, or middle cerebral arteries. No aneurysm. POSTERIOR CIRCULATION: No significant stenosis of the vertebral, basilar, or posterior cerebral arteries. No aneurysm. OTHER: No dural venous sinus thrombosis on this non-dedicated study. BRAIN: No mass effect or midline shift. No extra-axial fluid collection. The gray-white differentiation is maintained.     1. No acute arterial abnormality or hemodynamically significant arterial stenosis in the head or neck. 2. Emphysema.        EKG INTERPRETATION:  EKG by my preliminary interpretation shows sinus rhythm with rate of 83, normal axis, normal intervals, with no ST changes indicative of ischemia at this time.    PROCEDURES:   Procedures    ASSESSMENT AND PLAN:  JERAMIE SUGDEN is a 63 y.o. male with history of stroke 3 years ago with no residual deficits presenting this afternoon complaining of right-sided numbness both upper and lower extremities as well as right lower face.  Patient is currently on no anticoagulation.  On exam apart from the decreased sensation the rest of the exam was unremarkable and normal strength.  Given his presentation stroke alert was activated.  Imaging studies as well as labs were obtained with no abnormal findings.  Stroke team recommended no intervention or TPA given the fact that his symptoms were mild.  However they recommended admitting for MRI and echo.  Because was consulted and admitted patient however he decided that he was going to leave AGAINST MEDICAL ADVICE because he had to go to work.  Explained to him the consequences of leaving AGAINST MEDICAL ADVICE including death and he was able to articulate that to me.   At this point I thought he was decisional capacity to make that decision.  He Did sign the papers and leave AGAINST MEDICAL ADVICE.  On CT of the head he was found to have mastoiditis which I prescribed cefdinir for treatment.     ClINICAL IMPRESSION:  1. Mastoid pain, unspecified laterality          PATIENT REFERRED TO:  No follow-up provider specified.    DISCHARGE MEDICATIONS:  Discharge Medication List as of 09/09/2018  3:49 PM        DISCONTINUED MEDICATIONS:  Discharge Medication List as of 09/09/2018  3:49 PM      STOP taking these medications       losartan (COZAAR) 100 MG tablet Comments:   Reason for Stopping:         FLUoxetine (PROZAC) 20 MG capsule Comments:  Reason for Stopping:         buPROPion (WELLBUTRIN SR) 100 MG SR tablet Comments:   Reason for Stopping:         Loratadine 10 MG CAPS Comments:   Reason for Stopping:         naproxen (NAPROSYN) 500 MG tablet Comments:   Reason for Stopping:         cyclobenzaprine (FLEXERIL) 10 MG tablet Comments:   Reason for Stopping:         Blood Glucose Monitoring Suppl (BLOOD GLUCOSE MONITOR KIT) KIT Comments:   Reason for Stopping:         Glucose Blood (BLOOD GLUCOSE TEST STRIPS) STRP Comments:   Reason for Stopping:         nabumetone (RELAFEN) 500 MG tablet Comments:   Reason for Stopping:             DISPOSITION Ama 09/09/2018 03:36:21 PM  Mika Griffitts A Maddoxx Burkitt, MD (electronically signed)  09/10/2018  _________________________________________________________________________________________  _________________________________________________________________________________________  This record is transcribed utilizing voice recognition technology. There are inherent limitations in this technology. In addition, there may be limitations in editing of this report. If there are any discrepancies, please contact me directly.        Faizah Kandler Tomi Likens, MD  09/10/18 [phone]

## 2018-09-09 NOTE — ED Notes (Signed)
Patient wants to talk to MD about leaving      Lebron Conners, RN  09/09/18 1554

## 2018-09-09 NOTE — ED Notes (Signed)
Patient discharged against medical advise, he verbalizes understanding of risks.  Patient states he will come back if things get worse     Lebron Conners, RN  09/09/18 1555

## 2018-09-09 NOTE — ED Triage Notes (Signed)
Patient to CT via stretcher

## 2018-09-09 NOTE — ED Notes (Signed)
Patient called RN. Request to go to restroom. Patient states he would like to leave-be discharged. Dr Nile Dear will see patient after restroom.      Marchia Bond, RN  09/09/18 1529

## 2018-09-14 ENCOUNTER — Encounter: Attending: Surgery | Primary: Family Medicine

## 2018-12-12 ENCOUNTER — Emergency Department: Admit: 2018-12-12 | Payer: PRIVATE HEALTH INSURANCE

## 2018-12-12 ENCOUNTER — Inpatient Hospital Stay
Admit: 2018-12-12 | Discharge: 2018-12-12 | Disposition: A | Discharge status: Home / Self Care | Payer: PRIVATE HEALTH INSURANCE

## 2018-12-12 DIAGNOSIS — I10 Essential (primary) hypertension: Secondary | ICD-10-CM

## 2018-12-12 LAB — BASIC METABOLIC PANEL
Anion Gap: 9 mmol/L (ref 3–16)
BUN: 16 mg/dL (ref 7–25)
CO2: 22 mmol/L (ref 21–33)
Calcium: 9 mg/dL (ref 8.6–10.3)
Chloride: 103 mmol/L (ref 98–110)
Creatinine: 1.15 mg/dL (ref 0.60–1.30)
Glucose: 101 mg/dL (ref 70–100)
Osmolality, Calculated: 279 mOsm/kg (ref 278–305)
Potassium: 5.3 mmol/L (ref 3.5–5.3)
Sodium: 134 mmol/L (ref 133–146)
eGFR AA CKD-EPI: 78 See note.
eGFR NONAA CKD-EPI: 67 See note.

## 2018-12-12 LAB — DIFFERENTIAL
Basophils Absolute: 66 /uL (ref 0–200)
Basophils Relative: 0.9 % (ref 0.0–1.0)
Eosinophils Absolute: 153 /uL (ref 15–500)
Eosinophils Relative: 2.1 % (ref 0.0–8.0)
Lymphocytes Absolute: 2643 /uL (ref 850–3900)
Lymphocytes Relative: 36.2 % (ref 15.0–45.0)
Monocytes Absolute: 526 /uL (ref 200–950)
Monocytes Relative: 7.2 % (ref 0.0–12.0)
Neutrophils Absolute: 3913 /uL (ref 1500–7800)
Neutrophils Relative: 53.6 % (ref 40.0–80.0)
nRBC: 0 /100 WBC (ref 0–0)

## 2018-12-12 LAB — CBC
Hematocrit: 44.2 % (ref 38.5–50.0)
Hemoglobin: 15.2 g/dL (ref 13.2–17.1)
MCH: 31.9 pg (ref 27.0–33.0)
MCHC: 34.4 g/dL (ref 32.0–36.0)
MCV: 92.7 fL (ref 80.0–100.0)
MPV: 9.6 fL (ref 7.5–11.5)
Platelets: 194 10*3/uL (ref 140–400)
RBC: 4.76 10*6/uL (ref 4.20–5.80)
RDW: 13.9 % (ref 11.0–15.0)
WBC: 7.3 10*3/uL (ref 3.8–10.8)

## 2018-12-12 LAB — HIGH SENSITIVITY TROPONIN
High Sensitivity Troponin: 7 ng/L (ref 0–20)
High Sensitivity Troponin: 7 ng/L (ref 0–20)

## 2018-12-12 LAB — 2019 NOVEL CORONAVIRUS (COVID-19), NAA-B: SARS-CoV-2: NOT DETECTED

## 2018-12-12 LAB — PHOSPHORUS: Phosphorus: 4.1 mg/dL (ref 2.1–4.7)

## 2018-12-12 LAB — MAGNESIUM: Magnesium: 2.2 mg/dL (ref 1.5–2.5)

## 2018-12-12 MED ORDER — proCHLORPERazine (COMPAZINE) injection Soln 10 mg
10 | Freq: Once | INTRAMUSCULAR | Status: AC
Start: 2018-12-12 — End: 2018-12-12
  Administered 2018-12-12: 12:00:00 10 mg via INTRAMUSCULAR

## 2018-12-12 MED ORDER — proCHLORPERazine (COMPAZINE) injection Soln 10 mg
10 | Freq: Once | INTRAMUSCULAR | Status: AC
Start: 2018-12-12 — End: 2018-12-12

## 2018-12-12 MED FILL — PROCHLORPERAZINE EDISYLATE 10 MG/2 ML (5 MG/ML) INJECTION SOLUTION: 10 10 mg/2 mL (5 mg/mL) | INTRAMUSCULAR | Qty: 2

## 2018-12-12 NOTE — Unmapped (Signed)
[facility] ED Note    Date of Service: 12/12/2018  Reason for Visit: Hypertension      Patient History     HPI  [name] is a 63 y.o. male with a past medical history of previous stroke, COPD, diabetes, hypertension, and active tobacco use, who presents to the Emergency Department with concerns of hypertension.    According to the patient, about 1.5 hours prior to presenting to the ED, he developed a generalized headache, that he describes as band-like in nature. This headache prompted him to take his BP, was noted to be >277mmHg. He also reports having a brief episode of sharp substernal chest pain during that time, lasting several seconds prior to self-resolving. This prompted him to present to the ED for further management. Notably, patient does have a history of HTN and is currently on a multiple medication regimen. Patient says that he usually does not take his BP at home and took all of his medications after noticing his elevated BP prior to arriving to the ED. Reports adherence to his medication regimen.     Patient denies any focal weakness of his upper/lower extremities or notable facial droop or trouble with speech. Additionally, he denies any visual disturbances including blurred/double vision. He denies any confusion. He denies any chest pain or SOB. He denies any abdominal pain.     Other than stated above, no additional aggravating or alleviating factors are noted.    Past Medical History:   Diagnosis Date   ??? COPD (chronic obstructive pulmonary disease) (CMS Dx)    ??? Diabetes mellitus (CMS Dx)    ??? Dysphagia    ??? GERD (gastroesophageal reflux disease)    ??? Hepatitis    ??? Hypertension      Past Surgical History:   Procedure Laterality Date   ??? ESOPHAGOGASTRODUODENOSCOPY N/A 11/19/2013    Procedure: ESOPHAGOGASTRODUODENOSCOPY WITH MAC;  Surgeon: Charlane Ferretti, MD;  Location: [facility] ENDOSCOPY;  Service: Gastroenterology;  Laterality: N/A;   ??? FOOT SURGERY Left    ??? HERNIA REPAIR      about 5 yrs  ago     Patient  reports that he has been smoking cigarettes. He has a 1.25 pack-year smoking history. He has never used smokeless tobacco. He reports that he does not drink alcohol or use drugs.  Discharge Medication List as of 12/12/2018 10:34 AM      CONTINUE these medications which have NOT CHANGED    Details   ALBUTEROL INHL Inhale into the lungs., Until Discontinued, Historical Med      amLODIPine (NORVASC) 10 MG tablet Take 10 mg by mouth daily, Until Discontinued, Historical Med      aspirin 81 MG EC tablet Take 1 tablet (81 mg total) by mouth daily., Starting 07/31/2015, Until Discontinued, Print      atorvastatin (LIPITOR) 80 MG tablet Take 1 tablet (80 mg total) by mouth at bedtime., Starting 07/31/2015, Until Discontinued, Print      blood sugar diagnostic Strp Test daily, Starting 06/14/2011, Until Discontinued, Historical Med      blood-glucose meter Misc by Does not apply route., Starting 06/14/2011, Until Discontinued, Historical Med      buPROPion SR (WELLBUTRIN SR) 100 MG tablet Take 100 mg by mouth 2 times daily Ran out of prescription, Until Discontinued, Historical Med      entecavir (BARACLUDE) 0.5 MG tablet Take 1 tablet (0.5 mg total) by mouth daily., Starting Tue 08/13/2016, Normal, Disp-30 tablet, R-2  FLUoxetine (PROZAC) 20 MG capsule Starting 03/18/2014, Until Discontinued, Historical Med      hydroCHLOROthiazide (MICROZIDE) 12.5 mg capsule Take 1 capsule (12.5 mg total) by mouth daily., Starting 07/31/2015, Until Discontinued, Print      ibuprofen (ADVIL,MOTRIN) 800 MG tablet Take 0.5 tablets (400 mg total) by mouth every 6 hours as needed for up to 16 doses., Starting 02/24/2015, Until Discontinued, Print      ketoconazole (NIZORAL) 2 % cream Starting 02/15/2014, Until Discontinued, Historical Med      loratadine 10 mg Cap Take 1 tablet by mouth daily, Until Discontinued, Historical Med      losartan (COZAAR) 100 MG tablet Take 1 tablet (100 mg total) by mouth daily. Take 100 mg by mouth daily,  Starting 07/31/2015, Until Discontinued, Print      metFORMIN (GLUCOPHAGE) 500 MG tablet Take 500 mg by mouth 2 times a day with meals., Until Discontinued, Historical Med      ondansetron (ZOFRAN ODT) 4 MG disintegrating tablet Take 1 tablet (4 mg total) by mouth every 6 hours as needed for Nausea., Starting 04/27/2014, Until Discontinued, Normal      orphenadrine (NORFLEX) 100 mg tablet Take 1 tablet (100 mg total) by mouth 2 times a day., Starting 02/24/2015, Until Discontinued, Print      oxyCODONE (ROXICODONE) 5 MG immediate release tablet Take 1 tablet (5 mg total) by mouth every 4 hours as needed for Pain., Starting 03/26/2014, Until Discontinued, Print      oxyCODONE-acetaminophen (PERCOCET) 10-325 mg per tablet 1 tablet every 4 hours as needed.   , Starting 10/26/2013, Until Discontinued, Historical Med      tamsulosin (FLOMAX) 0.4 mg Cp24 Take 0.4 mg by mouth daily, Until Discontinued, Historical Med      traZODone (DESYREL) 100 MG tablet Starting 03/18/2014, Until Discontinued, Historical Med             Allergies:   Allergies as of 12/12/2018 - Fully Reviewed 12/12/2018   Allergen Reaction Noted   ??? Penicillins Hives 04/22/2013       All nursing notes and triage notes were appropriately reviewed in the course of the creation of this note.     Review of Systems     Review of Systems   Constitutional: Negative for chills and fever.   HENT: Negative for congestion, ear pain and sore throat.    Eyes: Negative for blurred vision, double vision, photophobia, pain and discharge.   Respiratory: Negative for cough, shortness of breath and wheezing.    Cardiovascular: Negative for chest pain and palpitations.   Gastrointestinal: Negative for abdominal pain, nausea and vomiting.   Genitourinary: Negative for dysuria, flank pain and urgency.   Musculoskeletal: Negative for back pain, joint pain and neck pain.   Skin: Negative for rash.   Neurological: Positive for headaches. Negative for dizziness, seizures and weakness.    Endo/Heme/Allergies: Does not bruise/bleed easily.   Psychiatric/Behavioral: Negative for depression.     Physical Exam     Vitals:    12/12/18 0437 12/12/18 0503 12/12/18 0609 12/12/18 1016   BP: (!) 188/118 (!) 176/103 (!) 160/101 118/69   BP Location: Right arm Left arm Left arm    Patient Position: Lying Lying Lying    Pulse: 77 72 72 68   Resp: 18 19 16     Temp: 98.3 ??F (36.8 ??C)      TempSrc: Oral      SpO2: 100% 100% 100%        Physical Exam  Cardiovascular:      Rate and Rhythm: Normal rate.      Pulses: Normal pulses.   Pulmonary:      Effort: Pulmonary effort is normal. No respiratory distress.      Breath sounds: Normal breath sounds.   Abdominal:      Palpations: Abdomen is soft.      Tenderness: There is no abdominal tenderness.   Musculoskeletal:      Right lower leg: No edema.      Left lower leg: No edema.   Skin:     General: Skin is warm.   Neurological:      Mental Status: He is alert and oriented to person, place, and time. Mental status is at baseline.      Cranial Nerves: No cranial nerve deficit.      Sensory: No sensory deficit.      Motor: No weakness.      Coordination: Coordination normal.      Gait: Gait normal.       Diagnostic Studies     Labs:  Labs Reviewed   BASIC METABOLIC PANEL - Abnormal; Notable for the following components:       Result Value    Glucose 101 (*)     All other components within normal limits   CBC   DIFFERENTIAL   MAGNESIUM   PHOSPHORUS   HIGH SENSITIVITY TROPONIN   HIGH SENSITIVITY TROPONIN   2019 NOVEL CORONAVIRUS (COVID-19), NAA-B    Narrative:     Does the patient have symptoms of Covid-19 (eg. Fever, dyspnea, cough, loss of smell)?->No   ED ECG 12-LEAD (MUSE)    Narrative:     Ventricular Rate:  69  BPM  Atrial Rate:  69  BPM  P-R Interval:  142  ms  QRS Duration:  86  ms  QT:  372  ms  QTc:  398  ms  P Axis:  64  degrees  R Axis:  70  degrees  T Axis:  95  degrees  Diagnosis Line:  INTERPRETATION NOT AVAILABLE--ECG READ IN ER ^ Confirmed by PHYSICIAN, ER  ([id number]), editor MITTERMEIER, MARIA ([id number]) on 12/12/2018 8:50:54 AM   ED ECG 12-LEAD (MUSE)    Narrative:     Ventricular Rate:  56  BPM  Atrial Rate:  56  BPM  P-R Interval:  146  ms  QRS Duration:  90  ms  QT:  432  ms  QTc:  416  ms  P Axis:  69  degrees  R Axis:  70  degrees  T Axis:  119  degrees  Diagnosis Line:  SINUS BRADYCARDIA ^ LEFT VENTRICULAR HYPERTROPHY WITH REPOLARIZATION CHANGE ^ ABNORMAL ECG       Radiology:  X-ray Portable Chest   Final Result   IMPRESSION:    No acute cardiopulmonary abnormality.      Approved by Carey Bullocks, MD on 12/12/2018 7:10 AM EST      I have personally reviewed the images and I agree with this report.      Report Verified by: Nadara Mode, DO at 12/12/2018 7:14 AM EST          EKG:  Indication HTN, Rate 69bpm, Rhythm sinus, ST Segment Change none, TWI in leads V2-V5, and Comparison to prior EKG in 2017  Emergency Department Procedures   None    ED Course and MDM         LENNYN BELLANCA is a 63 y.o. male with a  history and presentation as described above in HPI.  The patient was evaluated by myself and the ED Attending Physician, Dr. Lewis Moccasin, and R4 Dr. Barbette Merino. All management and disposition plans were discussed and agreed upon.    Upon presentation, the patient was well appearing, yet had notable HTN, and otherwise unremarkable vital signs. Although patient had a significant elevated systolic blood pressure, there were no concrete signs of end-organ damage including lack of chest pain, SOB, and focal neurological deficits. Notably, although the patient did complain of a generalized headache, his neurological assessment was found to be unremarkable during today's visit. Furthermore, since his blood pressure gradually declined during his ED stay, from to without intervention in the ED, there was a concern for medication nonadherence- as patient reports taking his prescribed regimen shortly prior to presentation to the ED.    A screening EKG was performed and  revealed new TWI in anterior leads, which were not seen in a prior EKG from 2017. As a result, additional lab tests were obtained including high-sensitivity troponin and a BMP. At the time of sign out to the oncoming provider, patient was resting comfortably in his exam room, awaiting results of lab testing and disposition.     Medications received during this ED visit:  Medications   proCHLORPERazine (COMPAZINE) injection Soln 10 mg (10 mg Intramuscular Given 12/12/18 0649)       At this time I am going off-service and will be signing out care of this patient to my colleague Dr. Russella Dar for further care. My colleague's responsibilities will include: reviewing lab tests, ultimate disposition    Impression     1. Hypertension, unspecified type         Plan     Sign out:  The patient is to be signed out to the oncoming provider, please refer to reassessment note for updates. Workup, treatment and diagnosis were discussed with the patient and/or family members; the patient agrees to the plan and all questions were addressed and answered.      Richardson Chiquito, MD, MS PGY-1  [facility] Emergency [facility]    Critical Care Time (Attendings)        Richardson Chiquito, MD  Resident  12/12/18 [phone]

## 2018-12-12 NOTE — Unmapped (Signed)
Thank you for coming to the [facility] Emergency Department for your care. It was our pleasure to care for you. You were seen in the emergency department for high blood pressure and headache. We performed blood tests and a chest x-ray which showed no abnormalities.  We performed an EKG which looks different than your previous EKGs.. You should talk with your primary care provider on Monday about this.  They may want you to undergo stress testing for further evaluation.     You should follow up with your primary care doctor on Monday as scheduled.     Please return to the ED if you develop worsening chest pain, shortness of breath, headache or any other new or worsening symptoms.

## 2018-12-12 NOTE — Unmapped (Signed)
ED Attending Attestation Note    Date of service:  12/12/2018    This patient was seen by the resident physician.  I have seen and examined the patient, agree with the workup, evaluation, management and diagnosis. The care plan has been discussed and I concur.  I have reviewed the ECG and concur with the resident's interpretation.    My assessment reveals a 63 y.o. male with headache likely secondary to HTN medication noncompliance.  Took all of his meds prior to arrival in ED after onset of headache. No focal deficit.  Has headaches frequently.

## 2018-12-12 NOTE — Unmapped (Signed)
[facility] ED  Reassessment Note    [name] is a 63 y.o. male who presented to the emergency department on 12/12/2018. This patient was initially seen by an off-going provider and their care has been turned over to me. Please see the original provider's note for details regarding the initial history, physical exam and ED course.  At the time of turnover the following steps in the patient's evaluation were pending:    1. Laboratory studies   2. Chest x-ray   3. Disposition     WOODROE VOGAN is a 63 y.o. male with a history of COPD, diabetes, hypertension, and active tobacco use who presents for 2 hour history of generalized headache. Took his blood pressure at home which was systolic 240. 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 Headache improved throughout ED course. Patient also reported substernal chest pain on presentation to the ED. Physical exam is reassuring without focal neuro findings. EKG otbained and showed new TWI in V2-V5, new compared to 2017. Laboratory studies obtained and pending at time of signout.     The patient maintained stable throughout his time in the emergency department.  He had improvement in his blood pressure to 118/69 at time of discharge.  Laboratory studies results and are reassuring.  Delta troponin normal.  CBC obtained and without abnormalities.  BMP without electrolyte abnormalities or other changes.  Chest x-ray was obtained and shows no acute cardiopulmonary abnormality.  Given the initial EKG with T-wave inversions, repeat EKG was obtained.  This continues to show T-wave inversions in the precordial leads.  I compared this to a previous EKG at an outside facility which shows T-wave inversion in V1 and V2 but otherwise normal T waves.  Troponin is normal which is reassuring that this is not an acute event.  I discussed this with the patient and reviewed multiple management options.  I offered admission for stress testing today.  She prefers to be  discharged home with close outpatient follow-up.  Patient states that he already has an appointment with his primary care physician in 2 days.  I stressed the importance of attending this appointment discussing his EKG results with his primary care provider.  The patient stresses understanding and is comfortable with this plan.  The patient was ultimately discharged home in stable condition with close outpatient follow-up.    Clinical Impression:    1.  Hypertension     Plan:     Discharge: At this time, the patient was deemed appropriate for discharge. Workup, treatment and diagnosis were discussed with the patient and/or family members; the patient agrees to the plan and all questions were addressed and answered. My customary discharge instructions, including strict return precautions for new or worsening symptoms or any concern he believes warrants acute physician evaluation, were provided. he was subsequently sent home in stable/improved condition.      Lazarus Salines, MD   [facility]   Emergency Medicine PGY1

## 2018-12-12 NOTE — ED Triage Notes (Signed)
Pt is complaining of hypertension, blurred vision, and headache. When asked about his medications I don't know what they are called but it's like 3 different medications. He did take them tonight. He took his blood pressure and it said 215/185.

## 2018-12-31 ENCOUNTER — Other Ambulatory Visit: Payer: Self-pay

## 2018-12-31 DIAGNOSIS — Z20822 Contact with and (suspected) exposure to covid-19: Secondary | ICD-10-CM

## 2019-01-01 ENCOUNTER — Ambulatory Visit
Admit: 2019-01-01 | Discharge: 2019-01-01 | Payer: PRIVATE HEALTH INSURANCE | Attending: Cardiovascular Disease | Primary: Family Medicine

## 2019-01-01 DIAGNOSIS — I10 Essential (primary) hypertension: Secondary | ICD-10-CM

## 2019-01-01 MED ORDER — NIFEDIPINE ER OSMOTIC RELEASE 90 MG PO TB24
90 MG | ORAL_TABLET | Freq: Every day | ORAL | 1 refills | Status: DC
Start: 2019-01-01 — End: 2019-02-26

## 2019-01-01 MED ORDER — HYDROCHLOROTHIAZIDE 25 MG PO TABS
25 MG | ORAL_TABLET | Freq: Every morning | ORAL | 1 refills | Status: DC
Start: 2019-01-01 — End: 2019-02-26

## 2019-01-01 NOTE — Patient Instructions (Signed)
Patient Education        High Blood Pressure: Care Instructions  Overview     It's normal for blood pressure to go up and down throughout the day. But if it stays up, you have high blood pressure. Another name for high blood pressure is hypertension.  Despite what a lot of people think, high blood pressure usually doesn't cause headaches or make you feel dizzy or lightheaded. It usually has no symptoms. But it does increase your risk of stroke, heart attack, and other problems. You and your doctor will talk about your risks of these problems based on your blood pressure.  Your doctor will give you a goal for your blood pressure. Your goal will be based on your health and your age.  Lifestyle changes, such as eating healthy and being active, are always important to help lower blood pressure. You might also take medicine to reach your blood pressure goal.  Follow-up care is a key part of your treatment and safety. Be sure to make and go to all appointments, and call your doctor if you are having problems. It's also a good idea to know your test results and keep a list of the medicines you take.  How can you care for yourself at home?  Medical treatment  ?? If you stop taking your medicine, your blood pressure will go back up. You may take one or more types of medicine to lower your blood pressure. Be safe with medicines. Take your medicine exactly as prescribed. Call your doctor if you think you are having a problem with your medicine.  ?? Talk to your doctor before you start taking aspirin every day. Aspirin can help certain people lower their risk of a heart attack or stroke. But taking aspirin isn't right for everyone, because it can cause serious bleeding.  ?? See your doctor regularly. You may need to see the doctor more often at first or until your blood pressure comes down.  ?? If you are taking blood pressure medicine, talk to your doctor before you take decongestants or anti-inflammatory medicine, such as  ibuprofen. Some of these medicines can raise blood pressure.  ?? Learn how to check your blood pressure at home.  Lifestyle changes  ?? Stay at a healthy weight. This is especially important if you put on weight around the waist. Losing even 10 pounds can help you lower your blood pressure.  ?? If your doctor recommends it, get more exercise. Walking is a good choice. Bit by bit, increase the amount you walk every day. Try for at least 30 minutes on most days of the week. You also may want to swim, bike, or do other activities.  ?? Avoid or limit alcohol. Talk to your doctor about whether you can drink any alcohol.  ?? Try to limit how much sodium you eat to less than 2,300 milligrams (mg) a day. Your doctor may ask you to try to eat less than 1,500 mg a day.  ?? Eat plenty of fruits (such as bananas and oranges), vegetables, legumes, whole grains, and low-fat dairy products.  ?? Lower the amount of saturated fat in your diet. Saturated fat is found in animal products such as milk, cheese, and meat. Limiting these foods may help you lose weight and also lower your risk for heart disease.  ?? Do not smoke. Smoking increases your risk for heart attack and stroke. If you need help quitting, talk to your doctor about stop-smoking programs and medicines. These   can increase your chances of quitting for good.  When should you call for help?   Call  911 anytime you think you may need emergency care. This may mean having symptoms that suggest that your blood pressure is causing a serious heart or blood vessel problem. Your blood pressure may be over 180/120.  For example, call 911 if:  ?? ?? You have symptoms of a heart attack. These may include:  ? Chest pain or pressure, or a strange feeling in the chest.  ? Sweating.  ? Shortness of breath.  ? Nausea or vomiting.  ? Pain, pressure, or a strange feeling in the back, neck, jaw, or upper belly or in one or both shoulders or arms.  ? Lightheadedness or sudden weakness.  ? A fast or  irregular heartbeat.   ?? ?? You have symptoms of a stroke. These may include:  ? Sudden numbness, tingling, weakness, or loss of movement in your face, arm, or leg, especially on only one side of your body.  ? Sudden vision changes.  ? Sudden trouble speaking.  ? Sudden confusion or trouble understanding simple statements.  ? Sudden problems with walking or balance.  ? A sudden, severe headache that is different from past headaches.   ?? ?? You have severe back or belly pain.   Do not wait until your blood pressure comes down on its own. Get help right away.  Call your doctor now or seek immediate care if:  ?? ?? Your blood pressure is much higher than normal (such as 180/120 or higher), but you don't have symptoms.   ?? ?? You think high blood pressure is causing symptoms, such as:  ? Severe headache.  ? Blurry vision.   Watch closely for changes in your health, and be sure to contact your doctor if:  ?? ?? Your blood pressure measures higher than your doctor recommends at least 2 times. That means the top number is higher or the bottom number is higher, or both.   ?? ?? You think you may be having side effects from your blood pressure medicine.   Where can you learn more?  Go to [email or web address] and sign in to your MyChart account. Enter X567 in the Search Health Information box to learn more about "High Blood Pressure: Care Instructions."     If you do not have an account, please click on the "Sign Up Now" link.  Current as of: January 12, 2018??????????????????????????????Content Version: 12.6  ?? 2006-2020 [employer].   Care instructions adapted under license by [facility]. If you have questions about a medical condition or this instruction, always ask your healthcare professional. [employer] disclaims any warranty or liability for your use of this information.

## 2019-01-01 NOTE — Progress Notes (Signed)
Cardiology Consultation   Referring Physician: Bing Neighbors Doriott, DO  Reason for Consultation: Hypertension  Chief Complaint: My BP is high       Subjective:   History of Present Illness:  [name] Peck is a 63 y.o. male with a past medical history significant for hypertension, hyperlipidemia,diabetes, and sleep apnea.   Today, he is here as a new patient for hypertension and chest pain. He says that he had an episode of chest pain and his blood pressure was very elevated. He says that the chest pain was at rest. Patient denies exertional chest pain/pressure, dyspnea at rest, DOE, PND, orthopnea, palpitations, lightheadedness, weight changes,and syncope.     Prior cardiac testing:    EKG:     Echo:     Stress Test:     Cath:       Past Medical History:   has a past medical history of Anxiety and depression, Cervical pain (neck), Chronic back pain, COPD (chronic obstructive pulmonary disease) ([place]), Degenerative cervical disc, Diabetes mellitus ([place]), GERD (gastroesophageal reflux disease), Hyperlipidemia, Hypertension, Obstructive sleep apnea, Sleep apnea, and Tobacco abuse.    Surgical History:   has a past surgical history that includes hernia repair (08/2009); Foot surgery (08-05-13); Colonoscopy; Toe Surgery (06-04-12); and bronchoscopy (09/23/13).     Social History:   reports that he has been smoking cigarettes. He has a 20.00 pack-year smoking history. He has never used smokeless tobacco. He reports that he does not drink alcohol or use drugs.     Family History:  family history includes High Blood Pressure in his mother.    Home Medications:  Were reviewed and are listed in nursing record and/or below  Prior to Admission medications    Medication Sig Start Date End Date Taking? Authorizing Provider   valsartan (DIOVAN) 320 MG tablet Take 320 mg by mouth daily 07/15/18  Yes Historical Provider, MD    buPROPion (WELLBUTRIN XL) 150 MG extended release tablet Take 150 mg by mouth daily 08/10/18  Yes Historical Provider, MD   loratadine (CLARITIN) 10 MG tablet Take 10 mg by mouth daily 08/10/18  Yes Historical Provider, MD   omeprazole (PRILOSEC) 40 MG delayed release capsule Take 40 mg by mouth daily 09/07/18  Yes Historical Provider, MD   oxyCODONE HCl (OXY-IR) 10 MG immediate release tablet Take 10 mg by mouth 3 times daily. 08/13/18  Yes Historical Provider, MD   escitalopram (LEXAPRO) 10 MG tablet Take 10 mg by mouth daily 08/12/18  Yes Historical Provider, MD   dilTIAZem (CARDIZEM CD) 240 MG extended release capsule Take 240 mg by mouth daily 07/06/18  Yes Historical Provider, MD   [employer] D3 50 MCG (2000 UT) CAPS Take 2,000 Units by mouth daily 08/27/18  Yes Historical Provider, MD   atorvastatin (LIPITOR) 80 MG tablet Take 80 mg by mouth nightly 08/10/18  Yes Historical Provider, MD   budesonide-formoterol (SYMBICORT) 160-4.5 MCG/ACT AERO Inhale 2 puffs into the lungs 2 times daily 12/13/13  Yes Lacretia Leigh, MD   albuterol (PROVENTIL HFA;VENTOLIN HFA) 108 (90 BASE) MCG/ACT inhaler Inhale 2 puffs into the lungs every 6 hours as needed 12/13/13  Yes Lacretia Leigh, MD   amLODIPine (NORVASC) 10 MG tablet Take 10 mg by mouth daily   Yes Historical Provider, MD   tamsulosin (FLOMAX) 0.4 MG capsule Take 0.4 mg by mouth daily   Yes Historical Provider, MD          Allergies:  Penicillins     Review of  Systems:   ?? Constitutional: no unanticipated weight loss. There's been no change in energy level, sleep pattern, or activity level. No fevers, chills.   ?? Eyes: No visual changes or diplopia. No scleral icterus.  ?? ENT: No Headaches, hearing loss or vertigo. No mouth sores or sore throat.  ?? Cardiovascular: as reviewed in HPI  ?? Respiratory: No cough or wheezing, no sputum production. No hemoptysis.    ?? Gastrointestinal: No abdominal pain, appetite loss, blood in stools. No change in bowel or bladder habits.  ?? Genitourinary: No  dysuria, trouble voiding, or hematuria.  ?? Musculoskeletal:  No gait disturbance, no joint complaints.  ?? Integumentary: No rash or pruritis.  ?? Neurological: No headache, diplopia, change in muscle strength, numbness or tingling.   ?? Psychiatric: No anxiety or depression.  ?? Endocrine: No temperature intolerance. No excessive thirst, fluid intake, or urination. No tremor.  ?? Hematologic/Lymphatic: No abnormal bruising or bleeding, blood clots or swollen lymph nodes.  ?? Allergic/Immunologic: No nasal congestion or hives.    Objective:   PHYSICAL EXAM:    Vitals:    01/01/19 1529   BP: (!) 154/98   Pulse:    Temp:    SpO2:     Weight: 197 lb 12.8 oz (89.7 kg)       General Appearance:  Alert, cooperative, no distress, appears stated age.   Head:  Normocephalic, without obvious abnormality, atraumatic.   Eyes:  Pupils equal and round. No scleral icterus.   Mouth: Moist mucosa, no pharyngeal erythema.   Nose: Nares normal. No drainage or sinus tenderness.   Neck: Supple, symmetrical, trachea midline. No adenopathy. No tenderness/mass/nodules. No carotid bruit or elevated JVD.   Lungs:   Clear to auscultation bilaterally, respirations unlabored. No wheeze, rales, or rhonchi.   Chest Wall:  No tenderness or deformity.   Heart:  Regular rate. S1/S2 normal. No murmur, rub, or gallop.   Abdomen:   Soft, non-tender, bowel sounds active.   Musculoskeletal: No muscle wasting or digital clubbing.   Extremities: Extremities normal, atraumatic. No cyanosis or edema.   Pulses: 2+ radial and carotid pulses, symmetric.   Skin: No rashes or lesions.   Pysch: Normal mood and affect. Alert and oriented x 4.   Neurologic: Normal gross motor and sensory exam.       Labs     CBC:   Lab Results   Component Value Date    WBC 6.6 09/09/2018    RBC 4.61 09/09/2018    HGB 14.5 09/09/2018    HCT 43.4 09/09/2018    MCV 94.2 09/09/2018    RDW 14.4 09/09/2018    PLT 185 09/09/2018     CMP:  Lab Results   Component Value Date    NA 141 09/09/2018     K 3.5 09/09/2018    CL 105 09/09/2018    CO2 23 09/09/2018    BUN 13 09/09/2018    CREATININE 1.1 09/09/2018    GFRAA >60 09/09/2018    GFRAA >60 07/09/2010    AGRATIO 1.6 09/09/2018    LABGLOM >60 09/09/2018    GLUCOSE 101 09/09/2018    PROT 6.9 09/09/2018    CALCIUM 9.0 09/09/2018    BILITOT 0.9 09/09/2018    ALKPHOS 78 09/09/2018    AST 12 09/09/2018    ALT 9 09/09/2018     PT/INR:  No results found for: PTINR  HgBA1c:  Lab Results   Component Value Date/Time    LABA1C 5.8 02/20/2010  10:35 AM     Troponin:   Lab Results   Component Value Date/Time    TROPONINI <0.01 09/09/2018 01:25 PM         CURRENT Medications:  Current Outpatient Medications on File Prior to Visit   Medication Sig Dispense Refill   ??? valsartan (DIOVAN) 320 MG tablet Take 320 mg by mouth daily     ??? buPROPion (WELLBUTRIN XL) 150 MG extended release tablet Take 150 mg by mouth daily     ??? loratadine (CLARITIN) 10 MG tablet Take 10 mg by mouth daily     ??? omeprazole (PRILOSEC) 40 MG delayed release capsule Take 40 mg by mouth daily     ??? oxyCODONE HCl (OXY-IR) 10 MG immediate release tablet Take 10 mg by mouth 3 times daily.     ??? escitalopram (LEXAPRO) 10 MG tablet Take 10 mg by mouth daily     ??? dilTIAZem (CARDIZEM CD) 240 MG extended release capsule Take 240 mg by mouth daily     ??? CVS D3 50 MCG (2000 UT) CAPS Take 2,000 Units by mouth daily     ??? atorvastatin (LIPITOR) 80 MG tablet Take 80 mg by mouth nightly     ??? budesonide-formoterol (SYMBICORT) 160-4.5 MCG/ACT AERO Inhale 2 puffs into the lungs 2 times daily 1 Inhaler 11   ??? albuterol (PROVENTIL HFA;VENTOLIN HFA) 108 (90 BASE) MCG/ACT inhaler Inhale 2 puffs into the lungs every 6 hours as needed 1 Inhaler 6   ??? amLODIPine (NORVASC) 10 MG tablet Take 10 mg by mouth daily     ??? tamsulosin (FLOMAX) 0.4 MG capsule Take 0.4 mg by mouth daily       No current facility-administered medications on file prior to visit.        Assessment and Plan   1) Hypertension  Elevated  Discontinue  Cardizem and Norvasc  Initiate HCTZ 25 mg daily and Procardia XL 90 mg daily.   Encouraged to keep BP diary daily and call office with update in 2 weeks.   Echocardiogram    2) Hyperlipidemia  Continue statin therapy    3) Sleep apnea  May need sleep study in the future    4) Diabetes  Managed by PCP  Follows with podiatry    5) Claudication  Bilateral lower extremity arterial doppler    Follow up in 4 months.       Thank you for allowing [place] to participate in the care of [place], [facility] [place] and [place]   (C): [phone]  Jenetta Downer): [phone]     This note was scribed in the presence of Dr Andee Lineman, by Heide Scales RN    Physician Attestation:  The scribes documentation has been prepared under my direction and personally reviewed by me in its entirety.     I confirm the note above accurately reflects all work, treatment, procedures, and medical decision making performed by me.    Electronically signed by Sharman Crate, MD on 01/05/2019 at 4:23 PM

## 2019-01-03 LAB — NOVEL CORONAVIRUS, NAA: SARS-CoV-2, NAA: NOT DETECTED

## 2019-01-04 ENCOUNTER — Telehealth: Payer: Self-pay

## 2019-01-04 NOTE — Telephone Encounter (Signed)
Patient given negative result and verbalized understanding  

## 2019-01-20 ENCOUNTER — Inpatient Hospital Stay: Payer: PRIVATE HEALTH INSURANCE | Primary: Family Medicine

## 2019-02-11 ENCOUNTER — Inpatient Hospital Stay: Payer: PRIVATE HEALTH INSURANCE | Primary: Family Medicine

## 2019-02-11 MED ORDER — GENERIC EXTERNAL MEDICATION
Status: DC
Start: 2019-02-11 — End: 2019-02-11

## 2019-02-24 NOTE — Telephone Encounter (Signed)
Last ov 01/01/19  Pending appt 05/03/19  Last refill both meds 01/01/19 #30x1

## 2019-02-26 MED ORDER — NIFEDIPINE ER OSMOTIC RELEASE 90 MG PO TB24
90 MG | ORAL_TABLET | ORAL | 1 refills | Status: DC
Start: 2019-02-26 — End: 2019-04-27

## 2019-02-26 MED ORDER — HYDROCHLOROTHIAZIDE 25 MG PO TABS
25 MG | ORAL_TABLET | ORAL | 1 refills | Status: DC
Start: 2019-02-26 — End: 2019-04-27

## 2019-03-01 ENCOUNTER — Inpatient Hospital Stay
Admit: 2019-03-01 | Discharge: 2019-03-01 | Disposition: A | Discharge status: Home / Self Care | Payer: PRIVATE HEALTH INSURANCE

## 2019-03-01 DIAGNOSIS — R197 Diarrhea, unspecified: Secondary | ICD-10-CM

## 2019-03-01 LAB — 2019 NOVEL CORONAVIRUS (COVID-19), NAA-B: SARS-CoV-2: NOT DETECTED

## 2019-03-01 LAB — INFLUENZA A AND B ASSAY, NAA
Influenza A: NEGATIVE
Influenza B: NEGATIVE

## 2019-03-01 NOTE — Unmapped (Signed)
[facility] ED Note    Date of Service: 03/01/2019      Reason for Visit: Suspected Coronavirus (Covid-19)      Patient History     HPI:  [name] is a 64 y.o. male COPD, HTN and T2DM presenting with diarrhea. The patient states he started having multiple bouts of watery, nonbloody diarrhea that started on Thursday.  Patient also had episodes of emesis that were also nonbloody nonbilious at that time.  He noted some mild fevers and chills.  Patient denied any chest pain, or shortness of breath that's  worse than his baseline shortness of breath from COPD.  He denies any recent travel, and is unsure sick contacts.  Patient denies any prior positive Covid test.  Patient did state he saw his PCP via tele health visit, and she told him to come to the emergency department to get tested for COVID      With the exception of above, the patient denies any aggravating or alleviating factors as well as any other associated signs or symptoms.    Past Medical History:   Diagnosis Date   ??? COPD (chronic obstructive pulmonary disease) (CMS Dx)    ??? Diabetes mellitus (CMS Dx)    ??? Dysphagia    ??? GERD (gastroesophageal reflux disease)    ??? Hepatitis    ??? Hypertension        Past Surgical History:   Procedure Laterality Date   ??? ESOPHAGOGASTRODUODENOSCOPY N/A 11/19/2013    Procedure: ESOPHAGOGASTRODUODENOSCOPY WITH MAC;  Surgeon: Charlane Ferretti, MD;  Location: [facility] ENDOSCOPY;  Service: Gastroenterology;  Laterality: N/A;   ??? FOOT SURGERY Left    ??? HERNIA REPAIR      about 5 yrs ago       [name]  reports that he has been smoking cigarettes. He has a 1.25 pack-year smoking history. He has never used smokeless tobacco. He reports that he does not drink alcohol or use drugs.    Discharge Medication List as of 03/01/2019  3:54 PM      CONTINUE these medications which have NOT CHANGED    Details   amLODIPine (NORVASC) 10 MG tablet Take 10 mg by mouth daily, Until Discontinued, Historical Med      aspirin 81 MG  EC tablet Take 1 tablet (81 mg total) by mouth daily., Starting 07/31/2015, Until Discontinued, Print      atorvastatin (LIPITOR) 80 MG tablet Take 1 tablet (80 mg total) by mouth at bedtime., Starting 07/31/2015, Until Discontinued, Print      hydroCHLOROthiazide (MICROZIDE) 12.5 mg capsule Take 1 capsule (12.5 mg total) by mouth daily., Starting 07/31/2015, Until Discontinued, Print      metFORMIN (GLUCOPHAGE) 500 MG tablet Take 500 mg by mouth 2 times a day with meals., Until Discontinued, Historical Med      oxyCODONE-acetaminophen (PERCOCET) 10-325 mg per tablet 1 tablet every 4 hours as needed.   , Starting 10/26/2013, Until Discontinued, Historical Med      ALBUTEROL INHL Inhale into the lungs., Until Discontinued, Historical Med      blood sugar diagnostic Strp Test daily, Starting 06/14/2011, Until Discontinued, Historical Med      blood-glucose meter Misc by Does not apply route., Starting 06/14/2011, Until Discontinued, Historical Med      buPROPion SR (WELLBUTRIN SR) 100 MG tablet Take 100 mg by mouth 2 times daily Ran out of prescription, Until Discontinued, Historical Med      entecavir (BARACLUDE) 0.5 MG tablet  Take 1 tablet (0.5 mg total) by mouth daily., Starting Tue 08/13/2016, Normal, Disp-30 tablet, R-2      FLUoxetine (PROZAC) 20 MG capsule Starting 03/18/2014, Until Discontinued, Historical Med      ibuprofen (ADVIL,MOTRIN) 800 MG tablet Take 0.5 tablets (400 mg total) by mouth every 6 hours as needed for up to 16 doses., Starting 02/24/2015, Until Discontinued, Print      ketoconazole (NIZORAL) 2 % cream Starting 02/15/2014, Until Discontinued, Historical Med      loratadine 10 mg Cap Take 1 tablet by mouth daily, Until Discontinued, Historical Med      losartan (COZAAR) 100 MG tablet Take 1 tablet (100 mg total) by mouth daily. Take 100 mg by mouth daily, Starting 07/31/2015, Until Discontinued, Print      ondansetron (ZOFRAN ODT) 4 MG disintegrating tablet Take 1 tablet (4 mg total) by mouth every 6 hours  as needed for Nausea., Starting 04/27/2014, Until Discontinued, Normal      orphenadrine (NORFLEX) 100 mg tablet Take 1 tablet (100 mg total) by mouth 2 times a day., Starting 02/24/2015, Until Discontinued, Print      oxyCODONE (ROXICODONE) 5 MG immediate release tablet Take 1 tablet (5 mg total) by mouth every 4 hours as needed for Pain., Starting 03/26/2014, Until Discontinued, Print      tamsulosin (FLOMAX) 0.4 mg Cp24 Take 0.4 mg by mouth daily, Until Discontinued, Historical Med      traZODone (DESYREL) 100 MG tablet Starting 03/18/2014, Until Discontinued, Historical Med             Allergies:   Allergies as of 03/01/2019 - Fully Reviewed 03/01/2019   Allergen Reaction Noted   ??? Penicillins Hives 04/22/2013       Nursing notes reviewed.    Review of Systems     Review of Systems   Constitutional: Negative for chills and fever.   HENT: Negative for congestion and sore throat.    Eyes: Negative for pain and discharge.   Respiratory: Negative for cough and shortness of breath.    Cardiovascular: Negative for chest pain and leg swelling.   Gastrointestinal: Positive for diarrhea and nausea. Negative for abdominal pain and vomiting.   Genitourinary: Negative for dysuria and hematuria.   Musculoskeletal: Negative for back pain and neck pain.   Skin: Negative for rash.   Neurological: Negative for sensory change and focal weakness.   All other systems reviewed and are negative.    All other systems reviewed are negative except as mentioned in HPI.      Physical Exam     ED Triage Vitals [03/01/19 1521]   Vital Signs Group      Temp 97.6 ??F (36.4 ??C)      Temp Source Oral      Heart Rate 95      Heart Rate Source Monitor      Resp 18      SpO2 99 %      BP 140/90      MAP (mmHg)       BP Location Left arm      BP Method Automatic      Patient Position    SpO2 99 %   O2 Device        General:  64 y.o. male, resting comfortably, in no acute distress  Head: Normacephalic, atraumatic  Eyes: PERRL, EOMI  Neck:  Neck supple  with normal ROM  Pulmonary:  Normal respiratory effort without distress.   Cardiac:  Well  perfused, non-cyanotic   Abdomen:  Soft, non-tender   Musculoskeletal:  Atraumatic exam without any bony deformity or tenderness. Normal gait.   Vascular:  2+ peripheral pulses in bilateral upper extremities   Skin:  warm, dry, well perfused   Neuro exam: A&Ox3, no focal deficits     Diagnostic Studies       Labs:    Labs Reviewed   INFLUENZA A AND B ASSAY, NAA   2019 NOVEL CORONAVIRUS (COVID-19), NAA-B    Narrative:     Does the patient have symptoms of Covid-19 (eg. Fever, dyspnea, cough, loss of smell)?->Yes         Radiology:    No orders to display       Emergency Department Procedures         ED Course and MDM   [name] is a 64 y.o. male who presented to the emergency department with a history and presentation as described above in HPI. The patient was evaluated by myself and the ED Attending Physician, Dr. Arva Chafe. All management and disposition plans were discussed and agreed upon.    Patient is a 64 y.o. male who is presenting with diarrhea.    Upon arrival to patient's room, vitals signs were reviewed and notable for no abnormalities     Exam was notable for a soft and non-tender abdomen, normal respiratory effort, and normal cardiac exam.     The patient marched for minutes in place with pulse-ox in place without any desaturations or development of symptoms.     A COVID and Flu test was sent and pending     The patient was re-assessed prior to final disposition without any new physical exam findings or new/worsening complaints.    At this time the patient has been deemed safe for discharge. Risks, benefits, and alternatives were discussed.   My customary discharge instructions were provided. Specific considerations for this patient include: shortness of breath, chest pain, N/V, not being able to keep any drink down   The patient was prescribed: NA  Patient indicated understanding and all questions were  answered. Return precautions provided. Additional verbal instructions given.        Impression     1. Suspected COVID-19 virus infection           Plan     1. The patient is to be discharged home in stable/improved condition.  2. Workup, treatment and diagnosis were discussed with the patient and/or family members; the patient agrees to the plan and all questions were addressed and answered.  3. The patient is instructed to return to the emergency department should his symptoms worsen or any concern he believes warrants acute physician evaluation.        Desmond Lope, MD,   PGY-2  [facility]    This note was dictated using voice-recognition software, which occasionally leads to inadvertent typographic errors.     Desmond Lope, MD  Resident  03/01/19 [phone]

## 2019-03-01 NOTE — Unmapped (Signed)
You were seen in the ED for possible COVID    You test is pending, if it is positive you will get a call, you won't be notified if it is negative    You can access your results on MyChart at any time    You should continue to quarantine until you no longer have symptoms for 48 hours     You should return to the emergency department if your symptoms worsen or do not resolve. In addition, return if:  - You have a fever (greater than 101 degrees)  - You have chest pain, shortness of breath, abdominal pain, or uncontrollable vomiting  - You are unable to eat or drink  - You pass out  - You have difficulty moving your arms or legs   - You have difficulty speaking or slurred speech  - Or you have any concern that you feel needs acute physician evaluation.    It was a pleasure taking care of you and we hope you feel better soon!

## 2019-03-01 NOTE — Unmapped (Signed)
ED Attending Attestation Note    Date of service:  03/01/2019    This patient was seen by the resident physician.  I have seen and examined the patient, agree with the workup, evaluation, management and diagnosis. The care plan has been discussed and I concur.     My assessment reveals a 64 y.o. male had severe diarrhea earlier this week.  Today he had a telemedicine visit with his primary care provider.  He is getting significantly better overall and able to eat and drink again.  However, his primary care provider thought this could be coded and therefore he came here for a test.    The patient denies fever, cough, chills, shortness of breath beyond baseline from his COPD.  He states he is significantly better than he was a few days ago with regard to his diarrhea.    On exam he is awake, well-appearing, 100 percent saturated on room air while I was talking to him.

## 2019-03-01 NOTE — Unmapped (Signed)
Generalized body aches, cough, diarrhea x5 days. Feeling better today

## 2019-03-12 ENCOUNTER — Encounter

## 2019-03-15 ENCOUNTER — Inpatient Hospital Stay: Admit: 2019-03-15 | Payer: PRIVATE HEALTH INSURANCE | Primary: Family Medicine

## 2019-03-15 DIAGNOSIS — I639 Cerebral infarction, unspecified: Secondary | ICD-10-CM

## 2019-03-19 ENCOUNTER — Inpatient Hospital Stay: Payer: PRIVATE HEALTH INSURANCE | Primary: Family Medicine

## 2019-03-19 ENCOUNTER — Ambulatory Visit: Payer: PRIVATE HEALTH INSURANCE | Primary: Family Medicine

## 2019-03-29 ENCOUNTER — Inpatient Hospital Stay: Admit: 2019-03-29 | Payer: PRIVATE HEALTH INSURANCE | Attending: Pain Medicine

## 2019-03-29 DIAGNOSIS — M47816 Spondylosis without myelopathy or radiculopathy, lumbar region: Secondary | ICD-10-CM

## 2019-04-13 ENCOUNTER — Encounter: Admit: 2019-04-13 | Discharge: 2019-04-13 | Payer: PRIVATE HEALTH INSURANCE

## 2019-04-13 DIAGNOSIS — M5416 Radiculopathy, lumbar region: Secondary | ICD-10-CM
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
## 2019-04-15 ENCOUNTER — Encounter: Admit: 2019-04-15 | Discharge: 2019-04-15 | Payer: PRIVATE HEALTH INSURANCE

## 2019-04-15 DIAGNOSIS — M5416 Radiculopathy, lumbar region: Secondary | ICD-10-CM
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
## 2019-04-20 ENCOUNTER — Encounter

## 2019-04-20 NOTE — Progress Notes (Signed)
Pt. No showed for today's appointment

## 2019-04-22 ENCOUNTER — Encounter
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
## 2019-04-27 ENCOUNTER — Encounter: Admit: 2019-04-27 | Discharge: 2019-04-27 | Payer: PRIVATE HEALTH INSURANCE

## 2019-04-27 DIAGNOSIS — M5416 Radiculopathy, lumbar region: Secondary | ICD-10-CM

## 2019-04-27 MED ORDER — NIFEDIPINE ER OSMOTIC RELEASE 90 MG PO TB24
90 MG | ORAL_TABLET | ORAL | 1 refills | Status: DC
Start: 2019-04-27 — End: 2019-06-30

## 2019-04-27 MED ORDER — HYDROCHLOROTHIAZIDE 25 MG PO TABS
25 MG | ORAL_TABLET | ORAL | 1 refills | Status: DC
Start: 2019-04-27 — End: 2019-06-30

## 2019-04-27 NOTE — Unmapped (Signed)
Physical Therapy Progress Note    Name: [name]     Date of Birth: 1955-02-20      MRN: [id number]    Date of evaluation: 04/13/2019  Referring Physician: Ander Slade, MD  [address]  [place],  [place] [address] Phone: [phone] Fax:   Specific Order: Radiculopathy, lumbar region - eval and treat   Date of Onset: Years ago  Primary Medical Diagnosis:   1. Lumbar radiculopathy       Insurance: Payor: [employer] / Plan: [employer] / Product Type: Medicaid [place] care /       Short Term Goals: time frame 2wks  initial status 04/13/2019   patient will perform home exercise program based on therapy recommendations independently to maintain benefits gained in physical therapy. Pt not independent in HEP addressing specific functional limitations.   patient will reduce pain to 0-3 /10 consistently on 0-10 pain scale to allow ease in ability to climb flight of steps up to bedroom and bathroom multiple times a day. Pain: Lumbar  Level (0-10): 8/10 at present; 10/10 at worst in past month; 5/10 at best   Location: midline LBP   Quality: constant, radiating, aching and throbbing        Pain: Distal/LE  Level (0-10): 8/10 at present;  10/10 at worst;  5/10 at best  Location: RLE posterior into foot  Quality: burning and numbness and tingling    Once pt climbs steps to bedroom, he tries not to come back down to avoid multiple trips up the steps. Reciprocal steps but fatigues easily with multiple trips.    Long Term Goals: time frame 5 wks    patient will improve hamstring flexibility to 170 in order to decrease muscular tension / improve comfort / decrease pain and symptoms of radiation. Hamstring flexibility 145 degrees in supine 90-90.    patient will improve global right hip/knee  strength to 5/5 in order to improve ability to ascend a flight of steps / ambulate for greater than 15 minutes / stand for greater than 15 minutes / improve support of LB and hips. Hip flex:L 4+/5   R: 4-/5  Hip abd:L 4/5 R:  4-/5  Hip ext:L: 4/5 R: 4-/5  Kn ext: L: 5/5 R: 4/5  Kn flex:L: 5/5 R: 4-/5  Cannot ambulate or stand for greater than 15 minutes before pt requires a break; avoids ascending steps multiple times.                  PT DAILY TREATMENT NOTES  Diagnosis:   1. Lumbar radiculopathy           Referring Provider:Tayeb, Earnie Larsson,*    Insurance plan:   Payor/Plan Subscr DOB Sex Relation Sub. Ins. ID Effective Group Num   1. [name], [name] 1955/05/12 Male Self [id number] 11/16/15                                    [address]     # of visits per insurance authorization:30  # of visits per POC: 10     Date of Initial Eval: 04/13/19      Verified Signed POC:    [x]  YES  []   NO   Date:   04/14/19 - Dr Elgie Collard    Message to Referral Source for signature via InBasket/ EMR []        Fax [x]   Mail []          Date:04/13/19    Routed to PCP (if other than referring) for signature via   EMR []  Fax []    Mail []   Date:    Was POC updated?   []   YES []   NO    Date:        If yes, Verified Signed POC:    []   YES  []   NO   Date:      Date of last POC:04/13/19  ______________________________________________________________________  PRIMARY PT: Atticus Wedin, PT  //  Pauline Aus, SPT           PT DAILY TREATMENT NOTE    Subjective:  Marland KitchenMarland KitchenI do like the traction - I think it's helping me some - and I have been doing the exercises more.Marland KitchenMarland KitchenI go see Dr Elgie Collard next Monday...    Objective:  Treatment per flow sheet - light cardio on sci-fit followed by bilat gastroc flexibility - he received mechanical lumbar traction in supine today - maintained same traction parameters today.  Minimal therex completed but exhibits appropriate technique with what he executed.        Therapeutic exercises performed today are noted in the log below.  They have been modified to suit current functional status and instructed for proper form to protect joint surfaces and soft tissue while enhancing flexibility and strength.    Manual techniques  were used today as indicated to improve resting muscle tone and flexibility in muscle groups that impact pain perception and active motion.    Exercise Log:  VISIT 1 2 3 4      DATE 04/13/2019 04/15/19 04/22/19 04/27/19     Pain Report LBP  8/10 LBP 8/10  soreness LBP 7/10 LBP  1-2/10     PT Evaluation:  Completed  xx xx xx     Gastroc stretch  3x30 H 3x30 H H45x2     Hamstring stretch 3x15 H   Supine  H30x2 / doing his neural tension glides     Sciatic nerve glide  x15 2x15  Did during HS stretch     SKTC and hold  5x10 H 10x10 H      DKTC x10        Supine / hooklying march   x20      Supine hip IR/ER stretch 3x30 each 3x30 H       Standing forward flexion stretch over table 3x30 Does not feel stretch this visit       Mini squats   2x10      Standing hip abd / ext  x15 each 2x10 each At II bars  ABD / EXT  2x10     Manual traction Relieved symptoms in RLE  3x30H Mechanical traction today     sci fit  1.0 x49min 2.0 x7 min 3.0x68min     Mechanical traction Future visit  90 max,   40 min;   On/off  60/20;   5deg pull angle  x56min Mech traction unavailable this visit / provided manual BLE / leg pull 90# max;   50# min;   On/off, 60/20sec;   3deg pull angle  x51min       Patient Education:04/13/2019:  patient received education on the purpose of therapy, participated in the development of the POC and verbalized understanding and agreement of POC, goals, initial HEP.     Assessment:   He seems to have become more diligent about performing HEP every day and  may be reaping those benefits!  He also appears to gain modest pain symptom relief from use of mechanical lumbar / supine traction.   Marland KitchenMarland KitchenMarland KitchenI feel much looser... as he exited therapy gym.    Plan:   Continue with POC - expanding therex difficulty / [employer] - follow-up appointment with pain care / Dr Elgie Collard 05/03/19.     MINUTES of TREATMENT   Evaluation/ Re evaluation    Therapeutic Exercise 23   Therapeutic Activity    Gait Training    Manual Therapy     Iontophoresis    Ultrasound    Traction / lumbar 20   Electrical Stimulation    Neuromuscular Rehab     Ice/ Heat    Total Treatment Time 43

## 2019-04-27 NOTE — Telephone Encounter (Signed)
Last OV: 01/01/19  Next OV: 05/03/19  Labs: x  Last EKG (if needed):

## 2019-04-29 ENCOUNTER — Encounter

## 2019-04-29 NOTE — Progress Notes (Signed)
Pt. No showed for today's appointment

## 2019-05-03 ENCOUNTER — Encounter: Attending: Cardiovascular Disease | Primary: Family Medicine

## 2019-05-04 ENCOUNTER — Encounter: Admit: 2019-05-04 | Discharge: 2019-05-04 | Payer: PRIVATE HEALTH INSURANCE

## 2019-05-04 DIAGNOSIS — M5416 Radiculopathy, lumbar region: Secondary | ICD-10-CM

## 2019-05-04 NOTE — Unmapped (Signed)
Physical Therapy Progress Note    Name: [name]     Date of Birth: 12/20/1955      MRN: [id number]    Date of evaluation: 04/13/2019  Referring Physician: Ander Slade, MD  [address]  [place],  [place] [address] Phone: [phone] Fax:   Specific Order: Radiculopathy, lumbar region - eval and treat   Date of Onset: Years ago  Primary Medical Diagnosis:   1. Lumbar radiculopathy       Insurance: Payor: [employer] / Plan: [employer] / Product Type: Medicaid [employer] care /       Short Term Goals: time frame 2wks  initial status 04/13/2019   patient will perform home exercise program based on therapy recommendations independently to maintain benefits gained in physical therapy. Pt not independent in HEP addressing specific functional limitations.   patient will reduce pain to 0-3 /10 consistently on 0-10 pain scale to allow ease in ability to climb flight of steps up to bedroom and bathroom multiple times a day. Pain: Lumbar  Level (0-10): 8/10 at present; 10/10 at worst in past month; 5/10 at best   Location: midline LBP   Quality: constant, radiating, aching and throbbing        Pain: Distal/LE  Level (0-10): 8/10 at present;  10/10 at worst;  5/10 at best  Location: RLE posterior into foot  Quality: burning and numbness and tingling    Once pt climbs steps to bedroom, he tries not to come back down to avoid multiple trips up the steps. Reciprocal steps but fatigues easily with multiple trips.    Long Term Goals: time frame 5 wks    patient will improve hamstring flexibility to 170 in order to decrease muscular tension / improve comfort / decrease pain and symptoms of radiation. Hamstring flexibility 145 degrees in supine 90-90.    patient will improve global right hip/knee  strength to 5/5 in order to improve ability to ascend a flight of steps / ambulate for greater than 15 minutes / stand for greater than 15 minutes / improve support of LB and hips. Hip flex:L 4+/5   R: 4-/5  Hip abd:L 4/5 R:  4-/5  Hip ext:L: 4/5 R: 4-/5  Kn ext: L: 5/5 R: 4/5  Kn flex:L: 5/5 R: 4-/5  Cannot ambulate or stand for greater than 15 minutes before pt requires a break; avoids ascending steps multiple times.                  PT DAILY TREATMENT NOTES  Diagnosis:   1. Lumbar radiculopathy           Referring Provider:Tayeb, Earnie Larsson,*    Insurance plan:   Payor/Plan Subscr DOB Sex Relation Sub. Ins. ID Effective Group Num   1. SOMTOCHUKWU, WOOLLARD 1956/01/07 Male Self [id number] 11/16/15                                    [address]     # of visits per insurance authorization:30  # of visits per POC: 10     Date of Initial Eval: 04/13/19      Verified Signed POC:    [x]  YES  []   NO   Date:   04/14/19 - Dr Elgie Collard    Message to Referral Source for signature via InBasket/ EMR []        Fax [x]   Mail []          Date:04/13/19    Routed to PCP (if other than referring) for signature via   EMR []  Fax []    Mail []   Date:    Was POC updated?   []   YES []   NO    Date:        If yes, Verified Signed POC:    []   YES  []   NO   Date:      Date of last POC:04/13/19  ______________________________________________________________________  PRIMARY PT: Minda Faas, PT  //  KATHERINE KOPLYAY, SPT           PT DAILY TREATMENT NOTE    Subjective:  ... I feeling great today - I like the traction - saw Dr Elgie Collard yesterday and he said to keep on doing the therapy - supposed to go back to see him 06/02/19...    Objective:  Treatment per flow sheet - light cardio on sci-fit followed by bilat gastroc flexibility - he received mechanical lumbar traction in supine today - maintaining same traction parameters today - minimal therex completed but exhibits good technique.       Therapeutic exercises performed today are noted in the log below.  They have been modified to suit current functional status and instructed for proper form to protect joint surfaces and soft tissue while enhancing flexibility and strength.    Manual techniques were  used today as indicated to improve resting muscle tone and flexibility in muscle groups that impact pain perception and active motion.    Exercise Log:  VISIT 1 2 3 4 5     DATE 04/13/2019 04/15/19 04/22/19 04/27/19 05/04/19    Pain Report LBP  8/10 LBP 8/10  soreness LBP 7/10 LBP  1-2/10 LBP 0-1/10    PT Evaluation:  Completed  xx xx xx     Gastroc stretch  3x30 H 3x30 H H45x2 H30x3    Hamstring stretch 3x15 H   Supine  H30x2 / doing his neural tension glides     Sciatic nerve glide  x15 2x15  Did during HS stretch     SKTC and hold  5x10 H 10x10 H      DKTC x10        Supine / hooklying march   x20      Supine hip IR/ER stretch 3x30 each 3x30 H       Standing forward flexion stretch over table 3x30 Does not feel stretch this visit       Mini squats   2x10      Standing hip abd / ext  x15 each 2x10 each At II bars  ABD / EXT  2x10     Manual traction Relieved symptoms in RLE  3x30H Mechanical traction today     sci fit  1.0 x73min 2.0 x7 min 3.0x34min 2.0x78min    Mechanical traction Future visit  90 max,   40 min;   On/off  60/20;   5deg pull angle  x36min Mech traction unavailable this visit / provided manual BLE / leg pull 90# max;   50# min;   On/off, 60/20sec;   3deg pull angle  x6min 90# max;   50# min;   On/off  60/20sec  5deg pull angle  x4min      Patient Education:04/13/2019:  patient received education on the purpose of therapy, participated in the development of the POC and verbalized understanding and agreement of POC, goals, initial HEP.  Assessment:  He continues to report LB pain reduction with continuing mechanical lumbar traction (in supine) - doing HEP at home 3-5d/week.    Plan:  Planning on discharge in next 6-7v - continue with POC - expanding therex difficulty / [employer] - follow-up appointment with pain care / Dr Elgie Collard 06/02/19.     MINUTES of TREATMENT   Evaluation/ Re evaluation    Therapeutic Exercise 10   Therapeutic Activity    Gait Training    Manual Therapy    Iontophoresis     Ultrasound    Traction / lumbar 22   Electrical Stimulation    Neuromuscular Rehab     Ice/ Heat    Total Treatment Time 32

## 2019-05-05 ENCOUNTER — Inpatient Hospital Stay: Payer: PRIVATE HEALTH INSURANCE | Primary: Family Medicine

## 2019-05-05 ENCOUNTER — Ambulatory Visit: Payer: PRIVATE HEALTH INSURANCE | Primary: Family Medicine

## 2019-05-06 ENCOUNTER — Encounter: Admit: 2019-05-06 | Discharge: 2019-05-06 | Payer: PRIVATE HEALTH INSURANCE

## 2019-05-06 DIAGNOSIS — M5416 Radiculopathy, lumbar region: Secondary | ICD-10-CM

## 2019-05-06 NOTE — Unmapped (Signed)
Physical Therapy Progress Note    Name: [name]     Date of Birth: 01-Dec-1955      MRN: [id number]    Date of evaluation: 04/13/2019  Referring Physician: Ander Slade, MD  [address]  [place],  [place] [address] Phone: [phone] Fax:   Specific Order: Radiculopathy, lumbar region - eval and treat   Date of Onset: Years ago  Primary Medical Diagnosis:   1. Lumbar radiculopathy       Insurance: Payor: [employer] / Plan: [employer] / Product Type: Medicaid [employer] care /       Short Term Goals: time frame 2wks  initial status 04/13/2019   patient will perform home exercise program based on therapy recommendations independently to maintain benefits gained in physical therapy. Pt not independent in HEP addressing specific functional limitations.   patient will reduce pain to 0-3 /10 consistently on 0-10 pain scale to allow ease in ability to climb flight of steps up to bedroom and bathroom multiple times a day. Pain: Lumbar  Level (0-10): 8/10 at present; 10/10 at worst in past month; 5/10 at best   Location: midline LBP   Quality: constant, radiating, aching and throbbing        Pain: Distal/LE  Level (0-10): 8/10 at present;  10/10 at worst;  5/10 at best  Location: RLE posterior into foot  Quality: burning and numbness and tingling    Once pt climbs steps to bedroom, he tries not to come back down to avoid multiple trips up the steps. Reciprocal steps but fatigues easily with multiple trips.    Long Term Goals: time frame 5 wks    patient will improve hamstring flexibility to 170 in order to decrease muscular tension / improve comfort / decrease pain and symptoms of radiation. Hamstring flexibility 145 degrees in supine 90-90.    patient will improve global right hip/knee  strength to 5/5 in order to improve ability to ascend a flight of steps / ambulate for greater than 15 minutes / stand for greater than 15 minutes / improve support of LB and hips. Hip flex:L 4+/5   R: 4-/5  Hip abd:L 4/5 R:  4-/5  Hip ext:L: 4/5 R: 4-/5  Kn ext: L: 5/5 R: 4/5  Kn flex:L: 5/5 R: 4-/5  Cannot ambulate or stand for greater than 15 minutes before pt requires a break; avoids ascending steps multiple times.                  PT DAILY TREATMENT NOTES  Diagnosis:   1. Lumbar radiculopathy           Referring Provider:Tayeb, Earnie Larsson,*    Insurance plan:   Payor/Plan Subscr DOB Sex Relation Sub. Ins. ID Effective Group Num   1. [name], [name] September 04, 1955 Male Self [id number] 11/16/15                                    [address]     # of visits per insurance authorization:30  # of visits per POC: 10     Date of Initial Eval: 04/13/19      Verified Signed POC:    [x]  YES  []   NO   Date:   04/14/19 - Dr Elgie Collard    Message to Referral Source for signature via InBasket/ EMR []        Fax [x]   Mail []          Date:04/13/19    Routed to PCP (if other than referring) for signature via   EMR []  Fax []    Mail []   Date:    Was POC updated?   []   YES []   NO    Date:        If yes, Verified Signed POC:    []   YES  []   NO   Date:      Date of last POC:04/13/19  ______________________________________________________________________  PRIMARY PT: JENNIFER ADKINS, PT  //  KATHERINE KOPLYAY, SPT           PT DAILY TREATMENT NOTE    Subjective:  States he feels funny the next day after traction so he does not believe it's helping him. States having discomfort in LB, but it's improved today since moving around.     Objective:  Treatment per flow sheet   Therapeutic exercises performed today are noted in the log below.  They have been modified to suit current functional status and instructed for proper form to protect joint surfaces and soft tissue while enhancing flexibility and strength.    Manual techniques were used today as indicated to improve resting muscle tone and flexibility in muscle groups that impact pain perception and active motion.    Exercise Log:  VISIT 1 2 3 4 5 6    DATE 04/13/2019 04/15/19 04/22/19 04/27/19 05/04/19  05/06/19   Pain Report LBP  8/10 LBP 8/10  soreness LBP 7/10 LBP  1-2/10 LBP 0-1/10 8/10   PT Evaluation:  Completed  xx xx xx     Gastroc stretch  3x30 H 3x30 H H45x2 H30x3 2 x 30 H   Hamstring stretch 3x15 H   Supine  H30x2 / doing his neural tension glides     Sciatic nerve glide  x15 2x15  Did during HS stretch     SKTC and hold  5x10 H 10x10 H      DKTC x10        Supine / hooklying march   x20      Supine hip IR/ER stretch 3x30 each 3x30 H       Standing forward flexion stretch over table 3x30 Does not feel stretch this visit       Mini squats   2x10      Standing ITB/TFL stretch      2 x 30 H    Cat/camel      X 8 w/ 5H   Quadruped w/ LE ext      W/ abdominal bracing   X 6 w/ 10H            Standing hip abd / ext  x15 each 2x10 each At II bars  ABD / EXT  2x10  Hip abd  5# 2 x 10  W/ abdominal bracing   Manual traction Relieved symptoms in RLE  3x30H Mechanical traction today     Manuals      Pacinian and ruffini technique   Thoracolumbar spine  X 9 min     sci fit  1.0 x36min 2.0 x7 min 3.0x16min 2.0x29min 3.0 x 6 min   Mechanical traction Future visit  90 max,   40 min;   On/off  60/20;   5deg pull angle  x21min Mech traction unavailable this visit / provided manual BLE / leg pull 90# max;   50# min;   On/off, 60/20sec;   3deg pull angle  x51min 90# max;   50# min;   On/off  60/20sec  5deg pull angle  x80min Reports no benefit     Patient Education:04/13/2019:  patient received education on the purpose of therapy, participated in the development of the POC and verbalized understanding and agreement of POC, goals, initial HEP.  05/06/19: Updated HEP    Assessment:  Added exercises to flow sheet to increase flexibility in (B) hip musculature and to increase stabilization in core. Pt. Tolerated new TherEx well and voiced understanding of updated HEP.   Plan:  Planning on discharge in next 6-7v - continue with POC - expanding therex difficulty / [employer] - follow-up appointment with pain care / Dr Elgie Collard  06/02/19.     MINUTES of TREATMENT   Evaluation/ Re evaluation    Therapeutic Exercise 22   Therapeutic Activity    Gait Training    Manual Therapy 9   Iontophoresis    Ultrasound    Traction / lumbar    Electrical Stimulation    Neuromuscular Rehab     Ice/ Heat    Total Treatment Time 31

## 2019-05-11 ENCOUNTER — Encounter

## 2019-05-13 ENCOUNTER — Encounter

## 2019-05-13 NOTE — Progress Notes (Signed)
Pt. No showed for today's appointment

## 2019-05-17 ENCOUNTER — Encounter: Attending: Cardiovascular Disease | Primary: Family Medicine

## 2019-05-18 ENCOUNTER — Encounter: Admit: 2019-05-18 | Discharge: 2019-05-18 | Payer: PRIVATE HEALTH INSURANCE

## 2019-05-18 DIAGNOSIS — M5416 Radiculopathy, lumbar region: Secondary | ICD-10-CM

## 2019-05-18 NOTE — Unmapped (Signed)
Physical Therapy Progress Note    Name: [name]     Date of Birth: Dec 10, 1955      MRN: [id number]    Date of evaluation: 04/13/2019  Referring Physician: Ander Slade, MD  [address]  [place],  [place] [address] Phone: [phone] Fax:   Specific Order: Radiculopathy, lumbar region - eval and treat   Date of Onset: Years ago  Primary Medical Diagnosis:   1. Lumbar radiculopathy       Insurance: Payor: [employer] / Plan: [employer] / Product Type: Medicaid [place] care /       Short Term Goals: time frame 2wks  initial status 04/13/2019   patient will perform home exercise program based on therapy recommendations independently to maintain benefits gained in physical therapy. Pt not independent in HEP addressing specific functional limitations.   patient will reduce pain to 0-3 /10 consistently on 0-10 pain scale to allow ease in ability to climb flight of steps up to bedroom and bathroom multiple times a day. Pain: Lumbar  Level (0-10): 8/10 at present; 10/10 at worst in past month; 5/10 at best   Location: midline LBP   Quality: constant, radiating, aching and throbbing        Pain: Distal/LE  Level (0-10): 8/10 at present;  10/10 at worst;  5/10 at best  Location: RLE posterior into foot  Quality: burning and numbness and tingling    Once pt climbs steps to bedroom, he tries not to come back down to avoid multiple trips up the steps. Reciprocal steps but fatigues easily with multiple trips.    Long Term Goals: time frame 5 wks    patient will improve hamstring flexibility to 170 in order to decrease muscular tension / improve comfort / decrease pain and symptoms of radiation. Hamstring flexibility 145 degrees in supine 90-90.    patient will improve global right hip/knee  strength to 5/5 in order to improve ability to ascend a flight of steps / ambulate for greater than 15 minutes / stand for greater than 15 minutes / improve support of LB and hips. Hip flex:L 4+/5   R: 4-/5  Hip abd:L 4/5 R:  4-/5  Hip ext:L: 4/5 R: 4-/5  Kn ext: L: 5/5 R: 4/5  Kn flex:L: 5/5 R: 4-/5  Cannot ambulate or stand for greater than 15 minutes before pt requires a break; avoids ascending steps multiple times.                  PT DAILY TREATMENT NOTES  Diagnosis:   1. Lumbar radiculopathy           Referring Provider:Tayeb, Earnie Larsson,*    Insurance plan:   Payor/Plan Subscr DOB Sex Relation Sub. Ins. ID Effective Group Num   1. OTHER, ATIENZA [birth year] Male Self [id number] 11/16/15                                    [address]     # of visits per insurance authorization:30  # of visits per POC: 10     Date of Initial Eval: 04/13/19      Verified Signed POC:    [x]  YES  []   NO   Date:   04/14/19 - Dr Elgie Collard    Message to Referral Source for signature via InBasket/ EMR []        Fax [x]   Mail []          Date:04/13/19    Routed to PCP (if other than referring) for signature via   EMR []  Fax []    Mail []   Date:    Was POC updated?   []   YES []   NO    Date:        If yes, Verified Signed POC:    []   YES  []   NO   Date:      Date of last POC:04/13/19  ______________________________________________________________________  PRIMARY PT: JENNIFER ADKINS, PT  //  KATHERINE KOPLYAY, SPT           PT DAILY TREATMENT NOTE    Subjective:  States he didn't show up last session d/t receiving Covid vaccine. States today his LB is doing okay and states pain is a 7/10  Objective:  Treatment per flow sheet   Therapeutic exercises performed today are noted in the log below.  They have been modified to suit current functional status and instructed for proper form to protect joint surfaces and soft tissue while enhancing flexibility and strength.    Manual techniques were used today as indicated to improve resting muscle tone and flexibility in muscle groups that impact pain perception and active motion.    Exercise Log:  VISIT 1 2 3 4 5 6 7    DATE 04/13/2019 04/15/19 04/22/19 04/27/19 05/04/19 05/06/19 05/18/19   Pain Report LBP  8/10  LBP 8/10  soreness LBP 7/10 LBP  1-2/10 LBP 0-1/10 8/10 7/10   PT Evaluation:  Completed  xx xx xx      Gastroc stretch  3x30 H 3x30 H H45x2 H30x3 2 x 30 H 2 x 30 H   Hamstring stretch 3x15 H   Supine  H30x2 / doing his neural tension glides      Sciatic nerve glide  x15 2x15  Did during HS stretch      SKTC and hold  5x10 H 10x10 H       DKTC x10         Supine / hooklying march   x20       Supine hip IR/ER stretch 3x30 each 3x30 H        Standing forward flexion stretch over table 3x30 Does not feel stretch this visit        Mini squats   2x10       Standing ITB/TFL stretch      2 x 30 H  2 x 30 H   Cat/camel      X 8 w/ 5H    Quadruped w/ LE ext      W/ abdominal bracing   X 6 w/ 10H              Standing hip abd / ext  x15 each 2x10 each At II bars  ABD / EXT  2x10  Hip abd  5# 2 x 10  W/ abdominal bracing    McGill crunch       X 6 w/ 10H   Manual traction Relieved symptoms in RLE  3x30H Mechanical traction today      Manuals      Pacinian and ruffini technique   Thoracolumbar spine  X 9 min   Pacinian and ruffini technique   Thoracolumbar spine  X 9 min   sci fit  1.0 x3min 2.0 x7 min 3.0x14min 2.0x25min 3.0 x 6 min 3.0 x 6 min   Mechanical traction Future  visit  90 max,   40 min;   On/off  60/20;   5deg pull angle  x30min Mech traction unavailable this visit / provided manual BLE / leg pull 90# max;   50# min;   On/off, 60/20sec;   3deg pull angle  x38min 90# max;   50# min;   On/off  60/20sec  5deg pull angle  x32min Reports no benefit      Patient Education:04/13/2019:  patient received education on the purpose of therapy, participated in the development of the POC and verbalized understanding and agreement of POC, goals, initial HEP.  05/06/19: Updated HEP. 05/18/19: Updated HEP    Assessment:  Added McGill crunch to flow sheet to increase core stability. Pt. Tolerated new TherEx well and voiced understanding of updated HEP.   Plan:  Planning on discharge in next 6-7v - continue with POC - expanding  therex difficulty / [employer] - follow-up appointment with pain care / Dr Elgie Collard 06/02/19.     MINUTES of TREATMENT   Evaluation/ Re evaluation    Therapeutic Exercise 20   Therapeutic Activity    Gait Training    Manual Therapy 9   Iontophoresis    Ultrasound    Traction / lumbar    Electrical Stimulation    Neuromuscular Rehab     Ice/ Heat    Total Treatment Time 29

## 2019-05-20 ENCOUNTER — Encounter

## 2019-05-20 NOTE — Progress Notes (Signed)
Patient no showed to today's scheduled PT appoinment

## 2019-05-25 ENCOUNTER — Encounter

## 2019-05-27 ENCOUNTER — Encounter: Admit: 2019-05-27 | Discharge: 2019-05-27 | Payer: PRIVATE HEALTH INSURANCE

## 2019-05-27 DIAGNOSIS — M47812 Spondylosis without myelopathy or radiculopathy, cervical region: Secondary | ICD-10-CM

## 2019-05-27 NOTE — Unmapped (Signed)
DISCHARGE  NOTE   ///  Physical Therapy Progress Note    Name: [name]     Date of Birth: [birth year]      MRN: [id number]    Date of evaluation: 04/13/2019  Referring Physician: Ander Slade, MD  [address]  [place],  [place] [address] Phone: [phone] Fax:   Specific Order: Radiculopathy, lumbar region - eval and treat   Date of Onset: Years ago  Primary Medical Diagnosis:   1. Spondylosis of cervical region without myelopathy or radiculopathy     2. Lumbar radiculopathy       Insurance: Payor: [employer] / Plan: [employer] / Product Type: Medicaid [employer] care /       Short Term Goals: time frame 2wks  initial status 04/13/2019   Discharge  Note   05/27/19   patient will perform home exercise program based on therapy recommendations independently to maintain benefits gained in physical therapy. Pt not independent in HEP addressing specific functional limitations. He is able to perform HEP as indicated - his diligence with executing it will dictate his success - FOLLOW UP WITH PAIN DOCTOR ON 06/01/19 - Dr  [name]    GOAL MET   patient will reduce pain to 0-3 /10 consistently on 0-10 pain scale to allow ease in ability to climb flight of steps up to bedroom and bathroom multiple times a day. Pain: Lumbar  Level (0-10): 8/10 at present; 10/10 at worst in past month; 5/10 at best   Location: midline LBP   Quality: constant, radiating, aching and throbbing        Pain: Distal/LE  Level (0-10): 8/10 at present;  10/10 at worst;  5/10 at best  Location: RLE posterior into foot  Quality: burning and numbness and tingling    Once pt climbs steps to bedroom, he tries not to come back down to avoid multiple trips up the steps. Reciprocal steps but fatigues easily with multiple trips.  Patient reports that LBP has been reduced to almost nothing 0-1/10 and even the BLE pain has improved  R>LLE  3-5/10 - even his N / T and LE pain has been reduced.  He reports being able to move around better and sleeping  more comfortably.  Greater ease of accomplishing step negotiating at this time - less fatigue reported     GOAL MET   Long Term Goals: time frame 5 wks     patient will improve hamstring flexibility to 170 in order to decrease muscular tension / improve comfort / decrease pain and symptoms of radiation. Hamstring flexibility 145 degrees in supine 90-90.  Bilateral HS length in supine was increased to 160-165deg (supine in 90deg hip / 90deg knee)    PROGRESS   patient will improve global right hip/knee  strength to 5/5 in order to improve ability to ascend a flight of steps / ambulate for greater than 15 minutes / stand for greater than 15 minutes / improve support of LB and hips. Hip flex:L 4+/5   R: 4-/5  Hip abd:L 4/5 R: 4-/5  Hip ext:L: 4/5 R: 4-/5  Kn ext: L: 5/5 R: 4/5  Kn flex:L: 5/5 R: 4-/5  Cannot ambulate or stand for greater than 15 minutes before pt requires a break; avoids ascending steps multiple times. Hip flex:L 4+/5   R: 4 /5  Hip abd:L 4/5 R: 4 /5  Hip ext:L: 4/5 R: 4 /5  Kn ext: L: 5/5 R: 4+/5  Kn flex:L: 5/5  R: 4/5   He reports being able to ambulate / stand for > 25-42min now w/o symptoms bothering him too much.    PROGRESS                      PT DAILY TREATMENT NOTES  Diagnosis:   1. Spondylosis of cervical region without myelopathy or radiculopathy     2. Lumbar radiculopathy           Referring Provider:Tayeb, Earnie Myers,*    Insurance plan:   Payor/Plan Subscr DOB Sex Relation Sub. Ins. ID Effective Group Num   1. [employer] Myers, Danny [birth year] Male Self [id number] 11/16/15                                    [address]     # of visits per insurance authorization:30  # of visits per POC: 10     Date of Initial Eval: 04/13/19      Verified Signed POC:    [x]  YES  []   NO   Date:   04/14/19 - Dr Elgie Collard    Message to Referral Source for signature via InBasket/ EMR []        Fax [x]        Mail []          Date:04/13/19    Routed to PCP (if other than referring) for signature via   EMR  []  Fax []    Mail []   Date:    Was POC updated?   []   YES []   NO    Date:        If yes, Verified Signed POC:    []   YES  []   NO   Date:      Date of last POC:04/13/19  ______________________________________________________________________  PRIMARY PT: Bricia Taher, PT  //  [name], SPT           PT DAILY TREATMENT NOTE    Subjective:   .Marland KitchenMarland KitchenMarland KitchenI'm feeling better - good today w/ no pain really.Marland KitchenMarland KitchenMarland KitchenI go back to pain clinic in May - will see what they say...    Objective:  Treatment per flow sheet - RE-EVALUATION COMPLETED TODAY FOR D/C - light cardio on sci-fit followed by bilat gastroc flexibility - he has not received mechanical lumbar traction for just 4 weeks - he wanted to try it again and agreed to receiving it this session for thoraco-lumbar relaxation.   Please see completed discharge summation above for final outcomes.      Therapeutic exercises performed today are noted in the log below.  They have been modified to suit current functional status and instructed for proper form to protect joint surfaces and soft tissue while enhancing flexibility and strength.    Manual techniques were used today as indicated to improve resting muscle tone and flexibility in muscle groups that impact pain perception and active motion.    Exercise Log:  VISIT 1 2 3 4 5 6 7 8    DATE 04/13/2019 04/15/19 04/22/19 04/27/19 05/04/19 05/06/19 05/18/19 05/27/19   Pain Report LBP  8/10 LBP 8/10  soreness LBP 7/10 LBP  1-2/10 LBP 0-1/10 8/10 7/10 0-1/10           RE-EVALUATION COMPELTED TODAY FOR D/C   PT Evaluation:  Completed  xx xx xx       Gastroc stretch  3x30 H 3x30 H H45x2 H30x3  2 x 30 H 2 x 30 H H45x2   Hamstring stretch 3x15 H   Supine  H30x2 / doing his neural tension glides    Supine  H30x2   Sciatic nerve glide  x15 2x15  Did during HS stretch       SKTC and hold  5x10 H 10x10 H        DKTC x10          Supine / hooklying march   x20        Supine hip IR/ER stretch 3x30 each 3x30 H         Standing forward flexion  stretch over table 3x30 Does not feel stretch this visit         Mini squats   2x10        Standing ITB/TFL stretch      2 x 30 H  2 x 30 H    Cat/camel      X 8 w/ 5H     Quadruped w/ LE ext      W/ abdominal bracing   X 6 w/ 10H                Standing hip abd / ext  x15 each 2x10 each At II bars  ABD / EXT  2x10  Hip abd  5# 2 x 10  W/ abdominal bracing     McGill crunch       X 6 w/ 10H    Manual traction Relieved symptoms in RLE  3x30H Mechanical traction today       Manuals      Pacinian and ruffini technique   Thoracolumbar spine  X 9 min   Pacinian and ruffini technique   Thoracolumbar spine  X 9 min    sci fit  1.0 x34min 2.0 x7 min 3.0x50min 2.0x49min 3.0 x 6 min 3.0 x 6 min 2.0x31min   Mechanical traction Future visit  90 max,   40 min;   On/off  60/20;   5deg pull angle  x10min Mech traction unavailable this visit / provided manual BLE / leg pull 90# max;   50# min;   On/off, 60/20sec;   3deg pull angle  x65min 90# max;   50# min;   On/off  60/20sec  5deg pull angle  x42min Reports no benefit  90# max;   50# min;   On/off  60/20sec  5deg pull angle  x60min      Patient Education:04/13/2019:  patient received education on the purpose of therapy, participated in the development of the POC and verbalized understanding and agreement of POC, goals, initial HEP.  05/06/19: Updated HEP. 05/18/19: Updated HEP    Assessment:  He voiced enjoying the mechanical lumbar traction again today!  He seems to have made modest progress in goal areas by meeting 2/4 and PROGRESS in other 2 areas.  He voiced being pleased with his overall rehab success up to this point.    Plan:  Patient is being discharged today from skilled PT services today  / [facility] - follow-up appointment with pain care / Dr Elgie Collard 06/01/19.     MINUTES of TREATMENT   Evaluation/ Re evaluation 12   Therapeutic Exercise 8   Therapeutic Activity    Gait Training    Manual Therapy    Iontophoresis    Ultrasound    Traction / lumbar 22   Electrical Stimulation     Neuromuscular Rehab     Ice/ Heat  Total Treatment Time 42

## 2019-06-23 ENCOUNTER — Inpatient Hospital Stay: Payer: PRIVATE HEALTH INSURANCE | Primary: Family Medicine

## 2019-06-29 NOTE — Telephone Encounter (Signed)
Last ov 01/01/19  Pending appt overdue for apt  (if apt not scheduled call the pt or send my chart message)  Last refill both meds 04/27/19 #30x1  Last labs 09/09/18

## 2019-06-30 MED ORDER — NIFEDIPINE ER OSMOTIC RELEASE 90 MG PO TB24
90 MG | ORAL_TABLET | ORAL | 1 refills | Status: DC
Start: 2019-06-30 — End: 2019-08-23

## 2019-06-30 MED ORDER — HYDROCHLOROTHIAZIDE 25 MG PO TABS
25 MG | ORAL_TABLET | ORAL | 1 refills | Status: DC
Start: 2019-06-30 — End: 2019-08-23

## 2019-07-29 DEATH — deceased

## 2019-08-23 MED ORDER — HYDROCHLOROTHIAZIDE 25 MG PO TABS
25 MG | ORAL_TABLET | ORAL | 1 refills | Status: DC
Start: 2019-08-23 — End: 2020-11-02

## 2019-08-23 MED ORDER — NIFEDIPINE ER OSMOTIC RELEASE 90 MG PO TB24
90 MG | ORAL_TABLET | ORAL | 1 refills | Status: AC
Start: 2019-08-23 — End: ?

## 2019-08-23 NOTE — Telephone Encounter (Signed)
Last OV: 01/01/2019  Next OV: none  Last refill:06/30/2019  Most recent Labs: 09/09/2018  Last PT/INR (if needed):  Last EKG (if needed):

## 2019-10-18 NOTE — Telephone Encounter (Signed)
Last ov 01/01/19  Pending appt overdue for apt  Last refill both meds 08/23/19 #30x1  Last labs 09/09/18      [phone] (home)   Left message on pts machine to call back    Pt is overdue for apt needs to call the office

## 2020-01-05 ENCOUNTER — Ambulatory Visit: Admit: 2020-01-05 | Payer: PRIVATE HEALTH INSURANCE

## 2020-01-05 ENCOUNTER — Other Ambulatory Visit: Admit: 2020-01-05 | Payer: PRIVATE HEALTH INSURANCE

## 2020-01-05 ENCOUNTER — Ambulatory Visit: Admit: 2020-01-05 | Discharge: 2020-01-05 | Payer: PRIVATE HEALTH INSURANCE

## 2020-01-05 DIAGNOSIS — Z125 Encounter for screening for malignant neoplasm of prostate: Secondary | ICD-10-CM

## 2020-01-05 DIAGNOSIS — R3129 Other microscopic hematuria: Secondary | ICD-10-CM

## 2020-01-05 DIAGNOSIS — R69 Illness, unspecified: Secondary | ICD-10-CM

## 2020-01-05 LAB — URINALYSIS W/RFL TO MICROSCOPIC
Bilirubin, UA: NEGATIVE
Glucose, UA: NEGATIVE mg/dL
Ketones, UA: NEGATIVE mg/dL
Nitrite, UA: NEGATIVE
Protein, UA: NEGATIVE mg/dL
RBC, UA: 5 /HPF (ref 0–3)
Specific Gravity, UA: 1.019 (ref 1.005–1.035)
Squam Epithel, UA: 5 /HPF (ref 0–5)
Urobilinogen, UA: <2 mg/dL (ref 0.2–1.9)
WBC, UA: 20 /HPF (ref 0–5)
pH, UA: 5 (ref 5.0–8.0)

## 2020-01-05 LAB — RENAL FUNCTION PANEL W/EGFR
Albumin: 4.4 g/dL (ref 3.5–5.7)
Anion Gap: 6 mmol/L (ref 3–16)
BUN: 15 mg/dL (ref 7–25)
CO2: 30 mmol/L (ref 21–33)
Calcium: 9.4 mg/dL (ref 8.6–10.3)
Chloride: 103 mmol/L (ref 98–110)
Creatinine: 1.01 mg/dL (ref 0.60–1.30)
Glucose: 92 mg/dL (ref 70–100)
Osmolality, Calculated: 288 mOsm/kg (ref 278–305)
Phosphorus: 3.3 mg/dL (ref 2.1–4.7)
Potassium: 4.2 mmol/L (ref 3.5–5.3)
Sodium: 139 mmol/L (ref 133–146)
eGFR AA CKD-EPI: >90 See note.
eGFR NONAA CKD-EPI: 78 See note.

## 2020-01-05 LAB — POC URINALYSIS
Bilirubin, UA: NEGATIVE
Glucose, POC, UA: NEGATIVE mg/dL
Ketones, POC, UA: NEGATIVE mg/dL
Nitrite, POC, UA: NEGATIVE
Protein, POC, UA: NEGATIVE mg/dL
Spec Grav, UA: >=1.03 (ref 1.005–1.035)
Urobilinogen, POC, UA: 0.2 mg/dL (ref 0.2–1.0)
pH, POC,UA: 5.5 (ref 5.0–8.0)

## 2020-01-05 MED ORDER — tamsulosin (FLOMAX) 0.4 mg Cap
0.4 | ORAL_CAPSULE | Freq: Every evening | ORAL | 3 refills | Status: AC
Start: 2020-01-05 — End: ?

## 2020-01-05 MED ORDER — oxybutynin (DITROPAN-XL) 10 MG 24 hr tablet
10 | ORAL_TABLET | Freq: Every day | ORAL | 3 refills | Status: AC
Start: 2020-01-05 — End: 2021-04-06

## 2020-01-05 NOTE — Unmapped (Signed)
Chief Complaint   Patient presents with   ??? New Patient Visit/ Consultation       History of Present Illness  [name] is a 64 y.o. male with history of HTN, T2DM, HLD who was referred by PCP  for evaluation of hematuria. Previously evaluated by PCP for UTI and was negative.     Onset: Unknown  Hematuria type: microscopic   Previous episodes: Yes  Prior evaluation: No  Associated symptoms: urinary frequency and flank pain on right, nocturia x 6-7, urgency UUI  Pertinent negatives: gross hematuria, urinary hesitancy, urinary retention, fever, chills, sweats, nausea and vomiting    History of urinary tract infections: No  History of kidney stones: No  History of previous GU surgeries: No  Personal history of GU malignancy: No  Family history of GU malignancy: No  History of smoking: Yes, current daily smoker for years    No flowsheet data found.    Review of Systems  Constitutional: Negative for fevers/chills, fatigue and unexpected weight change.   HENT: Negative for hearing loss, swallowing difficulty, neck stiffness.   Eyes: Negative for photophobia and vision disturbance.   Respiratory: Negative for cough, shortness of breath and wheezing.    Cardiovascular: Negative for chest pain and leg swelling.   Gastrointestinal: Negative for nausea, vomiting, abdominal pain, diarrhea, constipation and blood in stool.   Genitourinary: see HPI.   Musculoskeletal: Negative for back pain, joint swelling and gait problems.   Skin: Negative for rash and wound.   Neurological: Negative for dizziness, numbness, weakness   Hematological:  Does not bruise/bleed easily.   Psychiatric/Behavioral: Negative for confusion, hallucinations.   Medical / Surgical History  Patient has a past medical history of COPD (chronic obstructive pulmonary disease) (CMS Dx), Diabetes mellitus (CMS Dx), Dysphagia, GERD (gastroesophageal reflux disease), Hematuria, Hepatitis, and Hypertension.    Patient has a past surgical history that includes  Foot surgery (Left); Esophagogastroduodenoscopy (N/A, 11/19/2013); and Hernia repair.    Medications  Outpatient Encounter Medications as of 01/05/2020   Medication Sig Dispense Refill   ??? ALBUTEROL INHL Inhale into the lungs.     ??? amLODIPine (NORVASC) 10 MG tablet Take 10 mg by mouth daily     ??? aspirin 81 MG EC tablet Take 1 tablet (81 mg total) by mouth daily. 30 tablet 0   ??? atorvastatin (LIPITOR) 80 MG tablet Take 1 tablet (80 mg total) by mouth at bedtime. 30 tablet 0   ??? blood sugar diagnostic Strp Test daily     ??? blood-glucose meter Misc by Does not apply route.     ??? buPROPion SR (WELLBUTRIN SR) 100 MG tablet Take 100 mg by mouth 2 times daily Ran out of prescription     ??? entecavir (BARACLUDE) 0.5 MG tablet Take 1 tablet (0.5 mg total) by mouth daily. 30 tablet 2   ??? FLUoxetine (PROZAC) 20 MG capsule      ??? hydroCHLOROthiazide (MICROZIDE) 12.5 mg capsule Take 1 capsule (12.5 mg total) by mouth daily. 30 capsule 3   ??? ibuprofen (ADVIL,MOTRIN) 800 MG tablet Take 0.5 tablets (400 mg total) by mouth every 6 hours as needed for up to 16 doses. 16 tablet 0   ??? ketoconazole (NIZORAL) 2 % cream      ??? loratadine 10 mg Cap Take 1 tablet by mouth daily     ??? losartan (COZAAR) 100 MG tablet Take 1 tablet (100 mg total) by mouth daily. Take 100 mg by mouth daily 30 tablet  0   ??? metFORMIN (GLUCOPHAGE) 500 MG tablet Take 500 mg by mouth 2 times a day with meals.     ??? ondansetron (ZOFRAN ODT) 4 MG disintegrating tablet Take 1 tablet (4 mg total) by mouth every 6 hours as needed for Nausea. 16 tablet 0   ??? orphenadrine (NORFLEX) 100 mg tablet Take 1 tablet (100 mg total) by mouth 2 times a day. 10 tablet 0   ??? oxyCODONE (ROXICODONE) 5 MG immediate release tablet Take 1 tablet (5 mg total) by mouth every 4 hours as needed for Pain. 30 tablet 0   ??? oxyCODONE-acetaminophen (PERCOCET) 10-325 mg per tablet 1 tablet every 4 hours as needed.        ??? tamsulosin (FLOMAX) 0.4 mg Cp24 Take 0.4 mg by mouth daily     ??? traZODone  (DESYREL) 100 MG tablet        No facility-administered encounter medications on file as of 01/05/2020.       Allergies    Penicillins    Social / Family History  Patient reports that he has been smoking cigarettes. He has a 1.25 pack-year smoking history. He has never used smokeless tobacco. He reports that he does not drink alcohol and does not use drugs.    Patient's family history is not on file.    The following portions of the patient's history were reviewed and updated as appropriate: past medical history, past surgical history, current medications, allergies, past social history, past family history, problem list.    Physical exam  Vital signs and nursing notes reviewed.  Blood pressure (!) 164/111, pulse 85, temperature 98.6 ??F (37 ??C), temperature source Other, resp. rate 16, height 5' 11 (1.803 m), weight 190 lb (86.2 kg).    Constitutional: well developed, well nourished, not distressed  Head: normocephalic, atraumatic  Eyes: conjunctivae and extraocular motions are normal  Neck: normal range of motion  Cardiovascular: normal rate and regular rhythm  Pulmonary: normal respiratory effort, no respiratory distress, no wheezing  Abdominal: soft, no distention or mass, nontender, no CVA tenderness  Musculoskeletal: normal range of motion without deformity  Skin: warm, dry  Neurological: alert, oriented to person, place, and time. Cranial nerves grossly normal    Genitourinary Exam    Penis: no lesions, *circumcised, meatus patent  Bilateral testes palpable, no masses, non tender  No evidence of inguinal hernia  DRE: normal anal sphincter tone, no anal lesions. Prostate unable to be fully palpated, no nodules, smooth, non tender      Labs  Creatinine   Date Value Ref Range Status   12/12/2018 1.15 0.60 - 1.30 mg/dL Final     Comment:     MODERATE TO GROSS HEMOLYSIS EVIDENT.  RESULTS MAY BE INFLUENCED.   07/20/2018 1.15 0.60 - 1.30 mg/dL Final   95/62/1308 6.57 0.60 - 1.30 mg/dL Final     No results found for:  PSA      Dipstick UA: positive leuc; negative nitrite; positive blood    PVR:  34 ml  Assessment  Hematuria, microscopic   Patient also has significant LUTS including frequency, severe nocturia, urgency and UUI.  He is a chronic smoker which puts him at risk for bladder malignancy.  His age, presence of LUTS and microscopic hematuria he should be screened for prostate cancer.    Plan:  1. Discussed the implications  and likely benign and malignant causes of hematuria in this age group at great length. He  will need a CT urogram,  urine cytology and cystoscopic evaluation of hematuria. The procedure of cystoscopy has been explained and risks discussed  - Renal Profile  - PSA  - CT urogram  - Urine for cytology  - Cystoscopy after investigations    2. Discussed the pathophysiology of lower urinary tract symptoms in his age group. Discussed possible reasons. Also, discussed effect of bladder irritants like coffee, tea on lower urinary tract symptoms.  1. Will start patient on Flomax 0.4 mg and Oxybutynin xl 10 mg, side effects explained  2. Behavioral modification: avoidance of bladder irritants, restriction of nightly fluid intake    3. PSA screening for prostate cancer

## 2020-01-12 ENCOUNTER — Inpatient Hospital Stay: Payer: PRIVATE HEALTH INSURANCE

## 2020-01-13 ENCOUNTER — Ambulatory Visit: Payer: PRIVATE HEALTH INSURANCE

## 2020-04-18 ENCOUNTER — Emergency Department: Admit: 2020-04-19 | Payer: PRIVATE HEALTH INSURANCE

## 2020-04-18 DIAGNOSIS — R0602 Shortness of breath: Secondary | ICD-10-CM

## 2020-04-18 MED ORDER — acetaminophen (TYLENOL) tablet 975 mg
325 | Freq: Once | ORAL | Status: AC
Start: 2020-04-18 — End: 2020-04-18
  Administered 2020-04-19: 03:00:00 975 mg via ORAL

## 2020-04-18 MED ORDER — benzonatate (TESSALON) capsule 100 mg
100 | Freq: Once | ORAL | Status: AC
Start: 2020-04-18 — End: 2020-04-18
  Administered 2020-04-19: 03:00:00 100 mg via ORAL

## 2020-04-18 MED FILL — TYLENOL 325 MG TABLET: 325 325 mg | ORAL | Qty: 3

## 2020-04-18 MED FILL — BENZONATATE 100 MG CAPSULE: 100 100 MG | ORAL | Qty: 1

## 2020-04-18 NOTE — Unmapped (Signed)
Patient maintained adequate 02 sat while walking for 2 minutes around the room. HR maintained between 85-92 o2 maintained between 97-99.

## 2020-04-18 NOTE — Unmapped (Signed)
[place] ED Note    Date of service:  04/18/2020    Reason for Visit: Cough and Shortness of Breath      Patient History     HPI: Patient is a 65 year old male with past medical history of COPD, diabetes, hypertension who presents to the emergency department complaining of nasal congestion and cough.  The patient states that his daughter at home has been having similar symptoms, and that he started with symptoms 1 week ago.  He endorses nasal congestion, dry, however occasionally productive cough, and fever of up to 104 at home.  He further endorses generalized body aches.  He has not tried taking any medications for this.  He denies any chest pain, shortness of breath, chills, abdominal pain, nausea, vomiting, lower extremity swelling.       Past Medical History:   Diagnosis Date   ??? COPD (chronic obstructive pulmonary disease) (CMS Dx)    ??? Diabetes mellitus (CMS Dx)    ??? Dysphagia    ??? GERD (gastroesophageal reflux disease)    ??? Hematuria    ??? Hepatitis    ??? Hypertension        Past Surgical History:   Procedure Laterality Date   ??? ESOPHAGOGASTRODUODENOSCOPY N/A 11/19/2013    Procedure: ESOPHAGOGASTRODUODENOSCOPY WITH MAC;  Surgeon: Charlane Ferretti, MD;  Location: [facility] ENDOSCOPY;  Service: Gastroenterology;  Laterality: N/A;   ??? FOOT SURGERY Left    ??? HERNIA REPAIR      about 5 yrs ago       Social History     Tobacco Use   Smoking Status Current Every Day Smoker   ??? Packs/day: 0.25   ??? Years: 5.00   ??? Pack years: 1.25   ??? Types: Cigarettes   Smokeless Tobacco Never Used       Social History     Substance and Sexual Activity   Alcohol Use No       Social History     Substance and Sexual Activity   Drug Use No       Previous Medications    ALBUTEROL INHL    Inhale into the lungs.    AMLODIPINE (NORVASC) 10 MG TABLET    Take 10 mg by mouth daily    ASPIRIN 81 MG EC TABLET    Take 1 tablet (81 mg total) by mouth daily.    ATORVASTATIN (LIPITOR) 80 MG TABLET     Take 1 tablet (80 mg total) by mouth at bedtime.    BLOOD SUGAR DIAGNOSTIC STRP    Test daily    BLOOD-GLUCOSE METER MISC    by Does not apply route.    BUPROPION SR (WELLBUTRIN SR) 100 MG TABLET    Take 100 mg by mouth 2 times daily Ran out of prescription    ENTECAVIR (BARACLUDE) 0.5 MG TABLET    Take 1 tablet (0.5 mg total) by mouth daily.    FLUOXETINE (PROZAC) 20 MG CAPSULE        HYDROCHLOROTHIAZIDE (MICROZIDE) 12.5 MG CAPSULE    Take 1 capsule (12.5 mg total) by mouth daily.    IBUPROFEN (ADVIL,MOTRIN) 800 MG TABLET    Take 0.5 tablets (400 mg total) by mouth every 6 hours as needed for up to 16 doses.    KETOCONAZOLE (NIZORAL) 2 % CREAM        LORATADINE 10 MG CAP    Take 1 tablet by mouth daily    LOSARTAN (COZAAR) 100 MG TABLET  Take 1 tablet (100 mg total) by mouth daily. Take 100 mg by mouth daily    METFORMIN (GLUCOPHAGE) 500 MG TABLET    Take 500 mg by mouth 2 times a day with meals.    ONDANSETRON (ZOFRAN ODT) 4 MG DISINTEGRATING TABLET    Take 1 tablet (4 mg total) by mouth every 6 hours as needed for Nausea.    ORPHENADRINE (NORFLEX) 100 MG TABLET    Take 1 tablet (100 mg total) by mouth 2 times a day.    OXYBUTYNIN (DITROPAN-XL) 10 MG 24 HR TABLET    Take 1 tablet (10 mg total) by mouth daily.    OXYCODONE (ROXICODONE) 5 MG IMMEDIATE RELEASE TABLET    Take 1 tablet (5 mg total) by mouth every 4 hours as needed for Pain.    OXYCODONE-ACETAMINOPHEN (PERCOCET) 10-325 MG PER TABLET    1 tablet every 4 hours as needed.       TAMSULOSIN (FLOMAX) 0.4 MG CAP    Take 1 capsule (0.4 mg total) by mouth at bedtime. Take 0.4 mg by mouth daily    TRAZODONE (DESYREL) 100 MG TABLET           Allergies:   Allergies as of 04/18/2020 - Fully Reviewed 04/18/2020   Allergen Reaction Noted   ??? Penicillins Hives 04/22/2013       Review of Systems     ROS:  A complete ROS was performed and is otherwise negative        Physical Exam     ED Triage Vitals [04/18/20 2013]   Vital Signs Group      Temp 98.5 ??F (36.9 ??C)       Temp Source Oral      Heart Rate 85      Heart Rate Source Monitor      Resp 18      SpO2 97 %      BP (!) 185/108      MAP (mmHg) 119      BP Location Right arm      BP Method Automatic      Patient Position Sitting   SpO2 97 %   O2 Device None (Room air)     General:  Well-developed, well-nourished male, who appears his stated age, in no acute distress    Skin:  Warm and dry; no mottling, cyanosis, or jaundice    HEENT:  Normocephalic, atraumatic; face appears symmetrical with good muscle tone    Neck:  Supple, trachea midline    Cardio: Regular rate and rhythm, with no murmurs, rubs or gallops; chest appears symmetrical without evidence of trauma, erythema, or deformities;  non-tender to palpation with no lifts, heaves, or thrills; no lower extremity edema appreciated    Respiratory:  Breath sounds vesicular and equal in all lung fields; no wheezes, rales, or rhonchi appreciated; no accessory muscle use    GI: soft, non-tender, non-distended    MSK:  Patient able to move all 4 extremities equally and without discomfort    Neuro:  Awake, alert and oriented x4    Psych:  Appropriate mood, affect, and behavior    Diagnostic Studies     Labs:    Please see electronic medical record for any tests performed in the ED     Radiology:    Please see electronic medical record for any tests performed in the ED    EKG:    No EKG Performed    Emergency Department Procedures  ED Course and MDM     [name] is a 65 y.o. male who presented to the emergency department with Cough and Shortness of Breath      Patient is a 65 year old male with past medical history of COPD, diabetes, hypertension who presents to the emergency department complaining of nasal congestion, cough, body aches.  On exam, this is a well-appearing male in no acute distress.  Vital signs are significant for blood pressure of 185/108, otherwise within normal limits.  Maintains appropriate oxygen saturations upon ambulation with no significant  increased work of breathing.  Heart and lung sounds are within normal limits.  Abdomen is soft nontender, no lower extremity swelling, calf tenderness or tightness is appreciated.  Chest x-ray is clear with no signs of pneumonia.  Given the patient's history of recent sick contact in his daughter with similar symptoms, as well as his constellation of symptoms, I suspect that he is suffering from a viral upper respiratory infection.  He is having no chest pain or shortness of breath to be concerning for ACS, pulmonary embolism as the etiology of the patient's symptoms.  Covid and influenza testing are pending at this time.  Given his stable vital signs and generally well appearance, he is stable for outpatient management at this time.  Patient was given Tylenol and Tessalon Perles to be used as needed for his symptoms.  He is counseled on over-the-counter decongestants.  He is strongly encouraged to follow-up with his primary care provider in 1 to 2 weeks for reevaluation of his elevated blood pressure reading as well as his symptoms.  He is given strict return precautions.    The patient was encouraged to follow up with his primary care provider as needed or if symptoms continue, and to return to the emergency department with any new or worsening symptoms.  The patient verbalizes understanding, is amenable with this plan, and at this time is stable for discharge. The patient remained hemodynamically stable throughout the stay in the emergency department.        Critical Care Time (Attendings)        Evangeline Gula, PA  04/19/20 [phone]

## 2020-04-18 NOTE — Unmapped (Signed)
Patient denies CP

## 2020-04-18 NOTE — Unmapped (Signed)
ED Attending Attestation Note    Date of service:  04/18/2020    This patient was seen by the advanced practice provider.  I have seen and examined the patient, agree with the workup, evaluation, management and diagnosis.  The care plan has been discussed and I concur.      My assessment reveals a 65 y.o. male want to be tested for Covid as he may have been exposed by his daughter.  His daughter is not known positive.  Patient is asymptomatic.  And is in no acute distress.

## 2020-04-18 NOTE — Unmapped (Signed)
Patient  Presents to   [facility] with complaints of a cough and SOB for about 5 days. Patient      States he may have  Been exposed to someone with COVID. Patient appears well, no acute distress.

## 2020-04-19 ENCOUNTER — Inpatient Hospital Stay
Admit: 2020-04-19 | Discharge: 2020-04-19 | Disposition: A | Discharge status: Home / Self Care | Payer: PRIVATE HEALTH INSURANCE

## 2020-04-19 LAB — INFLUENZA A AND B, COVID, RSV COMBINATION ASSAY, NAA
Date of Symptom Onset: 20220315
Influenza A: NEGATIVE
Influenza B: NEGATIVE
RSV: NEGATIVE
SARS-CoV-2: NEGATIVE

## 2020-04-19 MED ORDER — benzonatate (TESSALON PERLES) 100 MG capsule
100 | ORAL_CAPSULE | Freq: Three times a day (TID) | ORAL | 0 refills | Status: AC | PRN
Start: 2020-04-19 — End: 2021-03-20

## 2020-04-19 NOTE — Unmapped (Signed)
Your chest xray did not show any signs of pneumonia. Your Covid and Influenza test are pending, you should be able to see the results in MyChart tomorrow. You are likely suffering from a virus causing an upper respiratory infection. You can take tessalon perles as needed for cough. You can use over the counter decongestants as needed for runny nose. You can take tylenol as needed for fevers or body aches.  Please follow up with your primary care provider in 1-2 weeks for recheck of your blood pressure and reevaluation of your symptoms.  Return to the ER with any new or worsening symptoms.

## 2020-04-19 NOTE — Unmapped (Signed)
Discharge paperwork reviewed to which patient verbalized understanding. Pt stable and ambulatory to the lobby with all personal belongings.

## 2020-06-18 ENCOUNTER — Encounter: Payer: Self-pay | Admitting: Emergency Medicine

## 2020-06-18 ENCOUNTER — Ambulatory Visit
Admission: EM | Admit: 2020-06-18 | Discharge: 2020-06-18 | Disposition: A | Discharge status: Home / Self Care | Payer: PRIVATE HEALTH INSURANCE | Attending: Family Medicine | Admitting: Family Medicine

## 2020-06-18 DIAGNOSIS — J449 Chronic obstructive pulmonary disease, unspecified: Secondary | ICD-10-CM

## 2020-06-18 DIAGNOSIS — J069 Acute upper respiratory infection, unspecified: Secondary | ICD-10-CM | POA: Diagnosis not present

## 2020-06-18 LAB — POCT RAPID STREP A (OFFICE): Rapid Strep A Screen: NEGATIVE

## 2020-06-18 MED ORDER — LEVOCETIRIZINE DIHYDROCHLORIDE 5 MG PO TABS: 5.0000 mg | ORAL_TABLET | Freq: Every evening | ORAL | 0 refills | Status: DC

## 2020-06-18 MED ORDER — IPRATROPIUM BROMIDE 0.03 % NA SOLN: 2.0000 | Freq: Two times a day (BID) | NASAL | 0 refills | Status: DC

## 2020-06-18 MED ORDER — PROMETHAZINE-DM 6.25-15 MG/5ML PO SYRP: 5.0000 mL | ORAL_SOLUTION | Freq: Three times a day (TID) | ORAL | 0 refills | Status: DC | PRN

## 2020-06-18 NOTE — ED Provider Notes (Signed)
[facility]    CSN: [id number] Arrival date & time: 06/18/20  0801      History   Chief Complaint Chief Complaint  Patient presents with  . Sore Throat    HPI Daniel [name] is a 65 y.o. male.   HPI  Patient with a history of COPD presents today with 1 day of body aches and sore throat.  Patient is afebrile.  Reports he has some nasal congestion mixed with drainage over the last week.  He takes no medications for allergy symptoms. He reports compliance with use of his maintenance inhaler and has occasionally had to use an extra dose of his rescue inhaler this week due to drainage.  Unaware of any known sick contacts.  He is fully vaccinated and boosted against COVID  Past Medical History:  Diagnosis Date  . COPD (chronic obstructive pulmonary disease) ([place])   . Hypertension     Patient Active Problem List   Diagnosis Date Noted  . Hematochezia 04/02/2017  . Heme + stool 04/02/2017  . Rotator cuff tear 04/05/2013  . Shoulder dislocation 04/05/2013  . Acute respiratory failure ([place]) 12/22/2012  . Essential hypertension, benign 12/22/2012  . COPD exacerbation ([place]) 12/21/2012  . Hypertriglyceridemia without hypercholesterolemia 03/12/2011  . Chest pain 03/06/2011  . Anxiety 03/06/2011  . Obesity (BMI 30-39.9) 03/06/2011  . Pre-diabetes 03/06/2011    Past Surgical History:  Procedure Laterality Date  . APPENDECTOMY    . COLONOSCOPY N/A 04/24/2017   Procedure: COLONOSCOPY;  Surgeon: Danie Binder, MD;  Location: [facility];  Service: Endoscopy;  Laterality: N/A;  1:00pm  . HEMORRHOID SURGERY    . HERNIA REPAIR     RLQ, umbilical       Home Medications    Prior to Admission medications   Medication Sig Start Date End Date Taking? Authorizing Provider  ipratropium (ATROVENT) 0.03 % nasal spray Place 2 sprays into both nostrils 2 (two) times daily. 06/18/20  Yes Scot Jun, FNP  levocetirizine (XYZAL) 5 MG tablet Take 1 tablet (5 mg total) by  mouth every evening. 06/18/20  Yes Scot Jun, FNP  promethazine-dextromethorphan (PROMETHAZINE-DM) 6.25-15 MG/5ML syrup Take 5 mLs by mouth 3 (three) times daily as needed for cough. 06/18/20  Yes Scot Jun, FNP  acetaminophen (TYLENOL) 500 MG tablet Take 1,000 mg by mouth daily as needed for moderate pain or headache.    [provider]  albuterol (PROVENTIL HFA;VENTOLIN HFA) 108 (90 BASE) MCG/ACT inhaler Inhale 2 puffs into the lungs every 4 (four) hours as needed for wheezing or shortness of breath. 12/23/12   Samuella Cota, MD  albuterol (PROVENTIL) (2.5 MG/3ML) 0.083% nebulizer solution Take 2.5 mg by nebulization every 6 (six) hours as needed for wheezing or shortness of breath.    [provider]  albuterol (PROVENTIL) 4 MG tablet Take 4 mg by mouth 4 (four) times daily.    [provider]  amLODipine (NORVASC) 5 MG tablet Take 5 mg by mouth daily.    [provider]  enalapril (VASOTEC) 5 MG tablet Take 10 mg by mouth 2 (two) times daily.     [provider]  furosemide (LASIX) 20 MG tablet Take 20 mg by mouth daily as needed for edema.  03/06/13   [provider]  Glucosamine-Chondroitin (OSTEO BI-FLEX REGULAR STRENGTH PO) Take 1 tablet by mouth daily.    [provider]  ibuprofen (ADVIL,MOTRIN) 200 MG tablet Take 400 mg by mouth daily as needed  for headache or moderate pain.    [provider]  Multiple Vitamin (MULITIVITAMIN WITH MINERALS) TABS Take 1 tablet by mouth daily.    [provider]  predniSONE (DELTASONE) 20 MG tablet Take 3 tablets (60 mg total) by mouth daily. 01/27/18   Hayden Rasmussen, MD  Tamsulosin HCl (FLOMAX) 0.4 MG CAPS Take 0.4 mg by mouth at bedtime.     [provider]    Family History Family History  Problem Relation Age of Onset  . Seizures Mother   . Hypertension Mother   . COPD Father   . Colon cancer Neg Hx   . Gastric cancer Neg Hx   .  Esophageal cancer Neg Hx     Social History Social History   Tobacco Use  . Smoking status: Former Smoker    Packs/day: 0.30    Years: 40.00    Pack years: 12.00    Types: Cigarettes    Quit date: 01/28/2013    Years since quitting: 7.3  . Smokeless tobacco: Never Used  Substance Use Topics  . Alcohol use: No  . Drug use: No     Allergies   Patient has no known allergies.   Review of Systems Review of Systems Pertinent negatives listed in HPI  Physical Exam Triage Vital Signs ED Triage Vitals  Enc Vitals Group     BP 06/18/20 0809 116/77     Pulse Rate 06/18/20 0809 63     Resp 06/18/20 0809 18     Temp 06/18/20 0809 97.9 F (36.6 C)     Temp Source 06/18/20 0809 Oral     SpO2 06/18/20 0809 96 %     Weight 06/18/20 0811 270 lb (122.5 kg)     Height --      Head Circumference --      Peak Flow --      Pain Score 06/18/20 0810 0     Pain Loc --      Pain Edu? --      Excl. in [place]? --    No data found.  Updated Vital Signs BP 116/77 (BP Location: Right Arm)   Pulse 63   Temp 97.9 F (36.6 C) (Oral)   Resp 18   Wt 270 lb (122.5 kg)   SpO2 96%   BMI 34.67 kg/m   Visual Acuity Right Eye Distance:   Left Eye Distance:   Bilateral Distance:    Right Eye Near:   Left Eye Near:    Bilateral Near:     Physical Exam  General Appearance:    Alert, cooperative, no distress  HENT:   Normocephalic, ears normal, nares mucosal edema with congestion, rhinorrhea, oropharynx dry without erythema or edema  Eyes:    PERRL, conjunctiva/corneas clear, EOM's intact       Lungs:     Clear to auscultation bilaterally, respirations unlabored  Heart:    Regular rate and rhythm  Neurologic:   Awake, alert, oriented x 3. No apparent focal neurological           defect.     Remainder of Review of Systems negative except as noted in the HPI.   UC Treatments / Results  Labs (all labs ordered are listed, but only abnormal results are displayed) Labs Reviewed  POCT  RAPID STREP A (OFFICE)    EKG   Radiology No results found.  Procedures Procedures (including critical care time)  Medications Ordered in UC Medications - No data to display  Initial Impression / Assessment and Plan / UC Course  I have reviewed the triage vital signs and the nursing notes.  Pertinent labs & imaging results that were available during my care of the patient were reviewed by me and considered in my medical decision making (see chart for details).    Viral URI with cough and patient with COPD without an acute exacerbation.  Treatment of symptoms warranted at present.  Per discharge medication orders.  Encourage patient to obtain an over-the-counter COVID test as he was concerned due to the length of wait time for the send out COVID results to return. Patient is overall well-appearing and symptoms do appear to be viral in nature however unable to rule out an infectious etiology.  Work note provided.  Red flags discussed warranting immediate evaluation in the ER. Final Clinical Impressions(s) / UC Diagnoses   Final diagnoses:  Viral URI with cough  COPD without exacerbation [facility])     Discharge Instructions     Rapid strep is negative.  Recommend purchasing over-the-counter COVID test to rule out COVID as a source of your symptoms.  I your symptoms appear to be viral which indicates symptom management.  Viral upper respiratory symptoms can take up to 5 to 10 days to completely resolve however should improve with use of prescribed therapy.  You experience any shortness of breath or severe chest pain go immediately to the emergency department.   ED Prescriptions    Medication Sig Dispense Auth. Provider   promethazine-dextromethorphan (PROMETHAZINE-DM) 6.25-15 MG/5ML syrup Take 5 mLs by mouth 3 (three) times daily as needed for cough. 118 mL Scot Jun, FNP   ipratropium (ATROVENT) 0.03 % nasal spray Place 2 sprays into both nostrils 2 (two) times daily. 30 mL  Scot Jun, FNP   levocetirizine (XYZAL) 5 MG tablet Take 1 tablet (5 mg total) by mouth every evening. 90 tablet Scot Jun, FNP     PDMP not reviewed this encounter.   Scot Jun, [place] 06/18/20 [phone]

## 2020-06-18 NOTE — Discharge Instructions (Addendum)
Rapid strep is negative.  Recommend purchasing over-the-counter COVID test to rule out COVID as a source of your symptoms.  I your symptoms appear to be viral which indicates symptom management.  Viral upper respiratory symptoms can take up to 5 to 10 days to completely resolve however should improve with use of prescribed therapy.  You experience any shortness of breath or severe chest pain go immediately to the emergency department.

## 2020-06-18 NOTE — ED Triage Notes (Signed)
Sore throat and body aches that started yesterday. No fevers.

## 2020-10-31 NOTE — Telephone Encounter (Signed)
Last OV:12/31/2020  Last Labs:09/08/2020  Last Refills:09/22/2020  Next Appt:  Last EKG: 01/01/2019     hydroCHLOROthiazide (HYDRODIURIL) 25 MG tablet

## 2020-11-02 MED ORDER — HYDROCHLOROTHIAZIDE 25 MG PO TABS
25 MG | ORAL_TABLET | ORAL | 1 refills | Status: DC
Start: 2020-11-02 — End: 2020-11-25

## 2020-11-24 NOTE — Telephone Encounter (Signed)
Patient hasn't seen the Dr. Andee Lineman since 01/01/2019 I wasn't able to Lvm  for patient to call the offices to scheduled an  appointment or let him know about him needing labs VM is full .

## 2020-11-25 MED ORDER — HYDROCHLOROTHIAZIDE 25 MG PO TABS
25 MG | ORAL_TABLET | ORAL | 1 refills | Status: AC
Start: 2020-11-25 — End: ?

## 2020-12-19 ENCOUNTER — Emergency Department: Admit: 2020-12-20 | Payer: Medicare (Managed Care)

## 2020-12-19 ENCOUNTER — Emergency Department: Admit: 2020-12-19 | Payer: Medicare (Managed Care)

## 2020-12-19 ENCOUNTER — Inpatient Hospital Stay: Admit: 2020-12-19 | Discharge: 2020-12-20 | Discharge status: Against Medical Advice | Payer: Medicare (Managed Care)

## 2020-12-19 DIAGNOSIS — R519 Headache, unspecified: Secondary | ICD-10-CM

## 2020-12-19 LAB — BASIC METABOLIC PANEL
Anion Gap: 6 mmol/L (ref 3–16)
BUN: 12 mg/dL (ref 7–25)
CO2: 28 mmol/L (ref 21–33)
Calcium: 9.5 mg/dL (ref 8.6–10.3)
Chloride: 104 mmol/L (ref 98–110)
Creatinine: 1.3 mg/dL (ref 0.60–1.30)
EGFR: 61
Glucose: 101 mg/dL (ref 70–100)
Osmolality, Calculated: 286 mOsm/kg (ref 278–305)
Potassium: 3.9 mmol/L (ref 3.5–5.3)
Sodium: 138 mmol/L (ref 133–146)

## 2020-12-19 LAB — HEPATIC FUNCTION PANEL
ALT: 4 U/L (ref 7–52)
AST: 8 U/L (ref 13–39)
Albumin: 4.2 g/dL (ref 3.5–5.7)
Alkaline Phosphatase: 81 U/L (ref 36–125)
Bilirubin, Direct: 0.16 mg/dL (ref 0.00–0.40)
Bilirubin, Indirect: 0.74 mg/dL (ref 0.00–1.10)
Total Bilirubin: 0.9 mg/dL (ref 0.0–1.5)
Total Protein: 7.2 g/dL (ref 6.4–8.9)

## 2020-12-19 LAB — URINALYSIS-MACROSCOPIC W/REFLEX TO MICROSCOPIC
Bilirubin, UA: NEGATIVE
Blood, UA: NEGATIVE
Glucose, UA: NEGATIVE mg/dL
Ketones, UA: NEGATIVE mg/dL
Leukocytes, UA: NEGATIVE
Nitrite, UA: NEGATIVE
Protein, UA: 30 mg/dL
Specific Gravity, UA: 1.015 (ref 1.005–1.035)
Urobilinogen, UA: <2 mg/dL (ref 0.2–1.9)
pH, UA: 7.5 (ref 5.0–8.0)

## 2020-12-19 LAB — CBC
Hematocrit: 44.1 % (ref 38.5–50.0)
Hemoglobin: 15 g/dL (ref 13.2–17.1)
MCH: 31.1 pg (ref 27.0–33.0)
MCHC: 34.1 g/dL (ref 32.0–36.0)
MCV: 91.3 fL (ref 80.0–100.0)
MPV: 8.9 fL (ref 7.5–11.5)
Platelets: 184 10*3/uL (ref 140–400)
RBC: 4.82 10*6/uL (ref 4.20–5.80)
RDW: 14.3 % (ref 11.0–15.0)
WBC: 6.8 10*3/uL (ref 3.8–10.8)

## 2020-12-19 LAB — HIGH SENSITIVITY TROPONIN: High Sensitivity Troponin: 7 ng/L (ref 0–20)

## 2020-12-19 LAB — DIFFERENTIAL
Basophils Absolute: 34 /uL (ref 0–200)
Basophils Relative: 0.5 % (ref 0.0–1.0)
Eosinophils Absolute: 27 /uL (ref 15–500)
Eosinophils Relative: 0.4 % (ref 0.0–8.0)
Lymphocytes Absolute: 1210 /uL (ref 850–3900)
Lymphocytes Relative: 17.8 % (ref 15.0–45.0)
Monocytes Absolute: 476 /uL (ref 200–950)
Monocytes Relative: 7 % (ref 0.0–12.0)
Neutrophils Absolute: 5052 /uL (ref 1500–7800)
Neutrophils Relative: 74.3 % (ref 40.0–80.0)
nRBC: 0 /100 WBC (ref 0–0)

## 2020-12-19 LAB — LIPASE: Lipase: 5 U/L (ref 4–82)

## 2020-12-19 LAB — URINALYSIS, MICROSCOPIC
Hyaline Casts, UA: 10 /LPF (ref 0–2)
RBC, UA: 1 /HPF (ref 0–3)
Squam Epithel, UA: 3 /HPF (ref 0–5)
WBC, UA: 7 /HPF (ref 0–5)

## 2020-12-19 NOTE — Unmapped (Signed)
Eloped with wife with IV intact. [facility] police notified

## 2020-12-19 NOTE — Unmapped (Signed)
ED Attending Attestation Note    Date of service:  12/19/2020    This patient was seen by the resident physician.  I have seen and examined the patient, agree with the workup, evaluation, management and diagnosis. The care plan has been discussed and I concur.  I have reviewed the ECG and concur with the resident's interpretation.    My assessment reveals a 65 y.o. male presenting with concern from his wife for confusion.  She reports the patient has been confused about certain things.  For example, she states that he was talking about a bank but then started talking about [employer].  She is not able to give any other specific examples of his confusion.  The patient himself states that he feels fine.  He is not any complaints at this time.  He denies chest pain, abdominal pain, shortness of breath.    On examination, the patient is seated upright in the stretcher.  He is a comfortable work of breathing with equal chest rise bilaterally.  He is in normal rate and regular rhythm.  He has a soft abdominal exam.  He is awake, alert, oriented.  He is GCS 4/5/6.  He is oriented to [facility], emergency department, hospital, day, date, month, and year.

## 2020-12-19 NOTE — Unmapped (Signed)
Pt c/o dizziness and confused x 2 x days,  hx strokes, have word salad per wife, no n/v/d, and r arm pain, and HA since this AM, took ibuprofen without relief, no limb weakness, hands shakey, hx DM, FSBS normal per family, resp reg easy even unlabored, pt able to speak in complete sentences clearly without difficulty, pt calm,

## 2020-12-19 NOTE — Unmapped (Signed)
Grips =, smile =, eye raise and close =, no pronator drift noted,

## 2020-12-19 NOTE — Unmapped (Signed)
[facility] ED Note    Date of Service: 12/19/2020  Reason for Visit: Headache      Patient History     HPI  [name] is a 65 y.o. male with a history of DMII, COPD, HTN, hepatitis who presents to the ED for evaluation of confusion and generalized weakness.  Patient reports symptoms since this morning which have been gradually worsening since onset.  He reports generalized weakness as well as fatigue.  Wife also reports confusion compared to baseline however patient denies this.  He feels otherwise well.  No chest pain or shortness of breath.  No abdominal pain, nausea, vomiting.  No diarrhea or constipation.  No fever or chills.  Patient appears to have noted to triage provider that he also had headache, however he denies this for me.  No vision changes, focal weakness, or sensation changes.  Patient denies history of similar symptoms.    Other than stated above, no additional aggravating or alleviating factors are noted.    Past Medical History:   Diagnosis Date   ??? COPD (chronic obstructive pulmonary disease) (CMS Dx)    ??? Diabetes mellitus (CMS Dx)    ??? Dysphagia    ??? GERD (gastroesophageal reflux disease)    ??? Hematuria    ??? Hepatitis    ??? Hypertension      Past Surgical History:   Procedure Laterality Date   ??? ESOPHAGOGASTRODUODENOSCOPY N/A 11/19/2013    Procedure: ESOPHAGOGASTRODUODENOSCOPY WITH MAC;  Surgeon: Charlane Ferretti, MD;  Location: [facility] ENDOSCOPY;  Service: Gastroenterology;  Laterality: N/A;   ??? FOOT SURGERY Left    ??? HERNIA REPAIR      about 5 yrs ago     Patient  reports that he has been smoking cigarettes. He has a 1.25 pack-year smoking history. He has never used smokeless tobacco. He reports that he does not drink alcohol and does not use drugs.  Discharge Medication List as of 12/19/2020  8:50 PM      CONTINUE these medications which have NOT CHANGED    Details   ALBUTEROL INHL Inhale into the lungs., Until Discontinued, Historical Med      amLODIPine (NORVASC) 10 MG tablet Take 10 mg  by mouth daily, Until Discontinued, Historical Med      aspirin 81 MG EC tablet Take 1 tablet (81 mg total) by mouth daily., Starting 07/31/2015, Until Discontinued, Print      atorvastatin (LIPITOR) 80 MG tablet Take 1 tablet (80 mg total) by mouth at bedtime., Starting 07/31/2015, Until Discontinued, Print      benzonatate (TESSALON PERLES) 100 MG capsule Take 1 capsule (100 mg total) by mouth 3 times a day as needed for Cough., Starting Wed 04/19/2020, Print, Disp-20 capsule, R-0      blood sugar diagnostic Strp Test daily, Starting 06/14/2011, Until Discontinued, Historical Med      blood-glucose meter Misc by Does not apply route., Starting 06/14/2011, Until Discontinued, Historical Med      buPROPion SR (WELLBUTRIN SR) 100 MG tablet Take 100 mg by mouth 2 times daily Ran out of prescription, Until Discontinued, Historical Med      entecavir (BARACLUDE) 0.5 MG tablet Take 1 tablet (0.5 mg total) by mouth daily., Starting Tue 08/13/2016, Normal, Disp-30 tablet, R-2      FLUoxetine (PROZAC) 20 MG capsule Starting 03/18/2014, Until Discontinued, Historical Med      hydroCHLOROthiazide (MICROZIDE) 12.5 mg capsule Take 1 capsule (12.5 mg total) by mouth daily., Starting 07/31/2015, Until  Discontinued, Print      ibuprofen (ADVIL,MOTRIN) 800 MG tablet Take 0.5 tablets (400 mg total) by mouth every 6 hours as needed for up to 16 doses., Starting 02/24/2015, Until Discontinued, Print      ketoconazole (NIZORAL) 2 % cream Starting 02/15/2014, Until Discontinued, Historical Med      loratadine 10 mg Cap Take 1 tablet by mouth daily, Until Discontinued, Historical Med      losartan (COZAAR) 100 MG tablet Take 1 tablet (100 mg total) by mouth daily. Take 100 mg by mouth daily, Starting 07/31/2015, Until Discontinued, Print      metFORMIN (GLUCOPHAGE) 500 MG tablet Take 500 mg by mouth 2 times a day with meals., Until Discontinued, Historical Med      ondansetron (ZOFRAN ODT) 4 MG disintegrating tablet Take 1 tablet (4 mg total) by mouth  every 6 hours as needed for Nausea., Starting 04/27/2014, Until Discontinued, Normal      orphenadrine (NORFLEX) 100 mg tablet Take 1 tablet (100 mg total) by mouth 2 times a day., Starting 02/24/2015, Until Discontinued, Print      oxybutynin (DITROPAN-XL) 10 MG 24 hr tablet Take 1 tablet (10 mg total) by mouth daily., Starting Wed 01/05/2020, Normal, Disp-30 tablet, R-3      oxyCODONE (ROXICODONE) 5 MG immediate release tablet Take 1 tablet (5 mg total) by mouth every 4 hours as needed for Pain., Starting 03/26/2014, Until Discontinued, Print      oxyCODONE-acetaminophen (PERCOCET) 10-325 mg per tablet 1 tablet every 4 hours as needed.   , Starting 10/26/2013, Until Discontinued, Historical Med      tamsulosin (FLOMAX) 0.4 mg Cap Take 1 capsule (0.4 mg total) by mouth at bedtime. Take 0.4 mg by mouth daily, Starting Wed 01/05/2020, Normal, Disp-30 capsule, R-3      traZODone (DESYREL) 100 MG tablet Starting 03/18/2014, Until Discontinued, Historical Med             Allergies:   Allergies as of 12/19/2020 - Fully Reviewed 12/19/2020   Allergen Reaction Noted   ??? Penicillins Hives 04/22/2013       All nursing notes and triage notes were appropriately reviewed in the course of the creation of this note.     Review of Systems     Positive for generalized weakness, fatigue, confusion  Negative for fever, chills, shortness of breath, chest pain, nausea, vomiting, abdominal pain    Complete review of systems completed and negative except as mentioned in the HPI.    Physical Exam     Vitals:    12/19/20 1528 12/19/20 1851   BP: 132/53 146/83   BP Location: Right arm Right arm   Patient Position: Sitting Lying   Pulse: 102 84   Resp: 19 17   Temp: 97.1 ??F (36.2 ??C)    TempSrc: Oral    SpO2: 100% 98%     General:  Well appearing. No acute distress, resting comfortably.  Eyes:  Pupils reactive. EOMI. No discharge from eyes.   ENT:  No discharge from nose. OP clear.   Neck:  Supple, trachea midline. Normal ROM.   Pulmonary:    Non-labored breathing. Breath sounds clear bilaterally.   Cardiac:  Regular rate and rhythm. No murmurs.   Abdomen:  Soft. Non-distended. Non-tender.   Musculoskeletal:  No long bone deformity.   Vascular:  Extremities warm and perfused.   Skin:  Dry, no rashes.   Extremities:  No peripheral edema  Neuro:  Alert and oriented x 4. CN  II-XII intact. 5/5 strength in all 4 extremities. Sensation grossly intact to light touch. Speech and mentation normal.  Gait narrow and stable    Diagnostic Studies     Labs:  See chart for laboratory study results for this visit.     Radiology:  See chart for radiology study results for this visit.     EKG Interpretation  Interpreted by emergency department physician     Rhythm: normal sinus   Rate: normal  Axis: normal  Ectopy: none  Conduction: normal  ST Segments: no acute change  T Waves: Somewhat sloping appearing T waves in V4 and V5 which are grossly unchanged compared to previous EKG on 12/16/2018  Q Waves: none    Clinical Impression: Normal sinus rhythm, no ST or T wave changes     Penina Reisner      Emergency Department Procedures     Please see separate procedure note for any procedures listed below in MDM.    ED Course and MDM     [name] is a 65 y.o. male with a history and presentation as described above in HPI.  The patient was evaluated by myself and the ED Attending Physician, Dr. Carey Bullocks. All management and disposition plans were discussed and agreed upon.    Upon presentation, the patient was well appearing and had normal vitals. Physical exam as noted above. Extensive workup was obtained.  CBC without leukocytosis or anemia.  BMP without electrolyte or creatinine changes.  LFTs and lipase are normal and urinalysis without signs of infection.  CT head obtained and negative for intracranial abnormalities.  Chest x-ray also unremarkable.  Patient also notes left shoulder pain, x-ray obtained and is negative.  I considered stroke given the patient history,  however symptoms are not consistent with this at this time.  No further work-up indicated including vessel imaging.  Exam today is reassuring.    Patient made in stable condition throughout his time in the emergency department.  Plan made to discharge patient home with close outpatient follow-up with primary care provider.  Strict return instructions were discussed.  Patient ultimately discharged home in stable condition.    Medications received during this ED visit:  Medications - No data to display      Impression     1. Headache, unspecified headache type         Plan     Discharge: At this time, the patient was deemed appropriate for discharge. Workup, treatment and diagnosis were discussed with the patient and/or family members; the patient agrees to the plan and all questions were addressed and answered. My customary discharge instructions, including strict return precautions for new or worsening symptoms or any concern he believes warrants acute physician evaluation, were provided. he was subsequently sent home in stable/improved condition.      This note was dictated using voice-recognition software, which occasionally leads to inadvertent typographic errors.    Lazarus Salines, MD, PGY-3  [facility]    Critical Care Time (Attendings)        Lazarus Salines, MD  Resident  12/21/20 [phone]

## 2020-12-20 LAB — HIGH SENSITIVITY TROPONIN: High Sensitivity Troponin: 7 ng/L (ref 0–20)

## 2020-12-29 ENCOUNTER — Encounter

## 2021-01-01 ENCOUNTER — Emergency Department (HOSPITAL_COMMUNITY): Payer: PRIVATE HEALTH INSURANCE

## 2021-01-01 ENCOUNTER — Encounter (HOSPITAL_COMMUNITY): Payer: Self-pay | Admitting: *Deleted

## 2021-01-01 ENCOUNTER — Emergency Department (HOSPITAL_COMMUNITY)
Admission: EM | Admit: 2021-01-01 | Discharge: 2021-01-01 | Disposition: A | Discharge status: Home / Self Care | Payer: PRIVATE HEALTH INSURANCE | Attending: Emergency Medicine | Admitting: Emergency Medicine

## 2021-01-01 DIAGNOSIS — J449 Chronic obstructive pulmonary disease, unspecified: Secondary | ICD-10-CM | POA: Insufficient documentation

## 2021-01-01 DIAGNOSIS — S01112A Laceration without foreign body of left eyelid and periocular area, initial encounter: Secondary | ICD-10-CM | POA: Insufficient documentation

## 2021-01-01 DIAGNOSIS — R52 Pain, unspecified: Secondary | ICD-10-CM

## 2021-01-01 DIAGNOSIS — Z23 Encounter for immunization: Secondary | ICD-10-CM | POA: Diagnosis not present

## 2021-01-01 DIAGNOSIS — W01198A Fall on same level from slipping, tripping and stumbling with subsequent striking against other object, initial encounter: Secondary | ICD-10-CM | POA: Diagnosis not present

## 2021-01-01 DIAGNOSIS — S0990XA Unspecified injury of head, initial encounter: Secondary | ICD-10-CM | POA: Diagnosis present

## 2021-01-01 DIAGNOSIS — S6991XA Unspecified injury of right wrist, hand and finger(s), initial encounter: Secondary | ICD-10-CM | POA: Insufficient documentation

## 2021-01-01 DIAGNOSIS — Z87891 Personal history of nicotine dependence: Secondary | ICD-10-CM | POA: Diagnosis not present

## 2021-01-01 DIAGNOSIS — I10 Essential (primary) hypertension: Secondary | ICD-10-CM | POA: Insufficient documentation

## 2021-01-01 DIAGNOSIS — W19XXXA Unspecified fall, initial encounter: Secondary | ICD-10-CM

## 2021-01-01 MED ORDER — OXYCODONE-ACETAMINOPHEN 5-325 MG PO TABS: 1.0000 | ORAL_TABLET | Freq: Three times a day (TID) | ORAL | 0 refills | Status: AC | PRN

## 2021-01-01 MED ORDER — OXYCODONE-ACETAMINOPHEN 5-325 MG PO TABS: 1.0000 | ORAL_TABLET | Freq: Three times a day (TID) | ORAL | 0 refills | Status: DC | PRN

## 2021-01-01 MED ORDER — LIDOCAINE HCL (PF) 1 % IJ SOLN
INTRAMUSCULAR | Status: AC
  Administered 2021-01-01: 5 mL
  Filled 2021-01-01: qty 30

## 2021-01-01 MED ORDER — LIDOCAINE HCL (PF) 1 % IJ SOLN
5.0000 mL | Freq: Once | INTRAMUSCULAR | Status: AC
  Administered 2021-01-01: 5 mL
  Filled 2021-01-01: qty 30

## 2021-01-01 MED ORDER — TETANUS-DIPHTH-ACELL PERTUSSIS 5-2.5-18.5 LF-MCG/0.5 IM SUSY
0.5000 mL | PREFILLED_SYRINGE | Freq: Once | INTRAMUSCULAR | Status: AC
  Administered 2021-01-01: 0.5 mL via INTRAMUSCULAR
  Filled 2021-01-01: qty 0.5

## 2021-01-01 MED ORDER — OXYCODONE-ACETAMINOPHEN 5-325 MG PO TABS
1.0000 | ORAL_TABLET | Freq: Once | ORAL | Status: AC
  Administered 2021-01-01: 1 via ORAL
  Filled 2021-01-01: qty 1

## 2021-01-01 NOTE — ED Triage Notes (Signed)
Tripped over pallet at work, hit head, pain in left forehead, right wrist , left hand pain , left knee.

## 2021-01-01 NOTE — Discharge Instructions (Addendum)
You have 4 sutures in your eyebrow, please refrain getting it wet for the first 24 hours, after that like to rinse out the wound change the dressings 2-3 times daily.I have given you a short course of narcotics please take as prescribed.  This medication can make you drowsy do not consume alcohol or operate heavy machinery when taking this medication.  This medication is Tylenol in it do not take Tylenol and take this medication.  Please follow-up with your PCP, urgent care, this department in the next 3 to 5 days to have the sutures removed.   Wrist pain I have placed you in a splint please wear during the day you may take off at nighttime and to shower, recommend over-the-counter pain medication as needed, if symptoms not improve after 2 weeks time please follow-up with orthopedic surgery.  Come back to the emergency department if you develop chest pain, shortness of breath, severe abdominal pain, uncontrolled nausea, vomiting, diarrhea.

## 2021-01-01 NOTE — ED Provider Notes (Signed)
[place] Provider Note   CSN: [id number] Arrival date & time: 01/01/21  1318     History Chief Complaint  Patient presents with   Lytle Michaels    Daniel [name] is a 65 y.o. male.  HPI  Patient with medical history including COPD, hypertension presents after a fall.  He states today while he was at work he tripped over a pallet causing him fall  onto his face.  He states his head hit the concrete floor, denies  losing conscious, states that he needed help getting up off the floor.  He states he was on ambulate after the incident.  He states since then he is now having a slight headache and feels slightly swimmy headed, he denies change in vision, paresthesia or weakness upper /lower extremities, he also endorses some right wrist and and right middle finger pain.  He has no other complaints at this time, pain remains constant, is worsened with movement, improves with rest.  He denies neck pain, chest pain, back pain, pain in his lower extremities.  Unsure when his last tetanus shot was.  Past Medical History:  Diagnosis Date   COPD (chronic obstructive pulmonary disease) ([place])    Hypertension     Patient Active Problem List   Diagnosis Date Noted   Hematochezia 04/02/2017   Heme + stool 04/02/2017   Rotator cuff tear 04/05/2013   Shoulder dislocation 04/05/2013   Acute respiratory failure ([place]) 12/22/2012   Essential hypertension, benign 12/22/2012   COPD exacerbation ([place]) 12/21/2012   Hypertriglyceridemia without hypercholesterolemia 03/12/2011   Chest pain 03/06/2011   Anxiety 03/06/2011   Obesity (BMI 30-39.9) 03/06/2011   Pre-diabetes 03/06/2011    Past Surgical History:  Procedure Laterality Date   APPENDECTOMY     COLONOSCOPY N/A 04/24/2017   Procedure: COLONOSCOPY;  Surgeon: Danie Binder, MD;  Location: [facility];  Service: Endoscopy;  Laterality: N/A;  1:00pm   HEMORRHOID SURGERY     HERNIA REPAIR     RLQ, umbilical       Family History   Problem Relation Age of Onset   Seizures Mother    Hypertension Mother    COPD Father    Colon cancer Neg Hx    Gastric cancer Neg Hx    Esophageal cancer Neg Hx     Social History   Tobacco Use   Smoking status: Former    Packs/day: 0.30    Years: 40.00    Pack years: 12.00    Types: Cigarettes    Quit date: 01/28/2013    Years since quitting: 7.9   Smokeless tobacco: Never  Substance Use Topics   Alcohol use: No   Drug use: No    Home Medications Prior to Admission medications   Medication Sig Start Date End Date Taking? Authorizing Provider  acetaminophen (TYLENOL) 500 MG tablet Take 1,000 mg by mouth daily as needed for moderate pain or headache.    [provider]  albuterol (PROVENTIL HFA;VENTOLIN HFA) 108 (90 BASE) MCG/ACT inhaler Inhale 2 puffs into the lungs every 4 (four) hours as needed for wheezing or shortness of breath. 12/23/12   Samuella Cota, MD  albuterol (PROVENTIL) (2.5 MG/3ML) 0.083% nebulizer solution Take 2.5 mg by nebulization every 6 (six) hours as needed for wheezing or shortness of breath.    [provider]  albuterol (PROVENTIL) 4 MG tablet Take 4 mg by mouth 4 (four) times daily.    [provider]  amLODipine (NORVASC) 5  MG tablet Take 5 mg by mouth daily.    [provider]  enalapril (VASOTEC) 5 MG tablet Take 10 mg by mouth 2 (two) times daily.     [provider]  furosemide (LASIX) 20 MG tablet Take 20 mg by mouth daily as needed for edema.  03/06/13   [provider]  Glucosamine-Chondroitin (OSTEO BI-FLEX REGULAR STRENGTH PO) Take 1 tablet by mouth daily.    [provider]  ibuprofen (ADVIL,MOTRIN) 200 MG tablet Take 400 mg by mouth daily as needed for headache or moderate pain.    [provider]  ipratropium (ATROVENT) 0.03 % nasal spray Place 2 sprays into both nostrils 2 (two) times daily. 06/18/20   Scot Jun, FNP  levocetirizine (XYZAL) 5 MG tablet Take  1 tablet (5 mg total) by mouth every evening. 06/18/20   Scot Jun, FNP  Multiple Vitamin (MULITIVITAMIN WITH MINERALS) TABS Take 1 tablet by mouth daily.    [provider]  predniSONE (DELTASONE) 20 MG tablet Take 3 tablets (60 mg total) by mouth daily. 01/27/18   Hayden Rasmussen, MD  promethazine-dextromethorphan (PROMETHAZINE-DM) 6.25-15 MG/5ML syrup Take 5 mLs by mouth 3 (three) times daily as needed for cough. 06/18/20   Scot Jun, FNP  Tamsulosin HCl (FLOMAX) 0.4 MG CAPS Take 0.4 mg by mouth at bedtime.     [provider]    Allergies    Patient has no known allergies.  Review of Systems   Review of Systems  Constitutional:  Negative for chills and fever.  HENT:  Negative for congestion.   Respiratory:  Negative for shortness of breath.   Cardiovascular:  Negative for chest pain.  Gastrointestinal:  Negative for abdominal pain.  Genitourinary:  Negative for enuresis.  Musculoskeletal:  Negative for back pain.       Right wrist, right middle finger pain.  Skin:  Negative for rash.  Neurological:  Positive for headaches. Negative for dizziness.  Hematological:  Does not bruise/bleed easily.   Physical Exam Updated Vital Signs BP 139/71   Pulse (!) 55   Temp 98.2 F (36.8 C) (Oral)   Resp 16   SpO2 94%   Physical Exam Vitals and nursing note reviewed.  Constitutional:      General: He is not in acute distress.    Appearance: He is not ill-appearing.  HENT:     Head: Normocephalic and atraumatic.     Comments: 4 cm laceration above the left eyebrow.  No other gross abnormalities present.  Head was nontender to palpation.    Nose: No congestion.     Mouth/Throat:     Mouth: Mucous membranes are moist.     Pharynx: Oropharynx is clear.  Eyes:     Extraocular Movements: Extraocular movements intact.     Conjunctiva/sclera: Conjunctivae normal.  Cardiovascular:     Rate and Rhythm: Normal rate and regular rhythm.     Pulses: Normal  pulses.     Heart sounds: No murmur heard.   No friction rub. No gallop.  Pulmonary:     Effort: No respiratory distress.     Breath sounds: No wheezing, rhonchi or rales.  Chest:     Chest wall: No tenderness.  Abdominal:     Palpations: Abdomen is soft.     Tenderness: There is no abdominal tenderness. There is no right CVA tenderness or left CVA tenderness.  Musculoskeletal:     Cervical back: No rigidity or tenderness.  Comments: Patient had tenderness on his right middle finger at the PIP joint, he also tenderness along the distal end of the ulna, neurovascular fully intact in all 4 extremities.  There is no pelvis instability, no leg shortening noted.  Spine was palpated is nontender to palpation.  Skin:    General: Skin is warm and dry.  Neurological:     Mental Status: He is alert.     Comments: No facial asymmetry, no difficult word finding, able follow two-step commands.  No unilateral weakness present.  Psychiatric:        Mood and Affect: Mood normal.    ED Results / Procedures / Treatments   Labs (all labs ordered are listed, but only abnormal results are displayed) Labs Reviewed - No data to display  EKG None  Radiology DG Wrist Complete Right  Result Date: 01/01/2021 CLINICAL DATA:  Pain in Rt wrist and Rt middle finger after fall while at work today fall EXAM: RIGHT WRIST - COMPLETE 3+ VIEW COMPARISON:  None. FINDINGS: No distal radius or ulnar fracture. Radiocarpal joint is intact. No carpal fracture. No soft tissue abnormality. IMPRESSION: No fracture or dislocation. Electronically Signed   By: Suzy Bouchard M.D.   On: 01/01/2021 16:13   CT Head Wo Contrast  Result Date: 01/01/2021 CLINICAL DATA:  Facial trauma EXAM: CT HEAD WITHOUT CONTRAST CT MAXILLOFACIAL WITHOUT CONTRAST TECHNIQUE: Multidetector CT imaging of the head and maxillofacial structures were performed using the standard protocol without intravenous contrast. Multiplanar CT image  reconstructions of the maxillofacial structures were also generated. COMPARISON:  None. FINDINGS: CT HEAD FINDINGS Brain: There is no acute intracranial hemorrhage, mass effect, or edema. Gray-white differentiation is preserved. Ventricles and sulci are within normal limits in size and configuration. There is a hyperdense 1.3 cm mass likely rising from the inferior falx and most consistent with a meningioma. Abuts the inferior sagittal sinus. Vascular: No hyperdense vessel or unexpected calcification. Skull: Unremarkable. Other: Frontal scalp soft tissue swelling. Mastoid air cells are clear. CT MAXILLOFACIAL FINDINGS Osseous: No acute facial fracture. Orbits: No intraorbital hematoma. Sinuses: Small left maxillary sinus retention cyst. Soft tissues: Left anterior inferior frontal scalp soft tissue swelling. IMPRESSION: No evidence of acute intracranial injury.  No acute facial fracture. 1.3 cm meningioma of the inferior falx. Electronically Signed   By: Macy Mis M.D.   On: 01/01/2021 16:04   DG Finger Middle Right  Result Date: 01/01/2021 CLINICAL DATA:  Pain in right wrist and right middle finger after fall while at work. EXAM: RIGHT MIDDLE FINGER 2+V COMPARISON:  None. FINDINGS: There is no evidence of fracture or dislocation. There is no evidence of arthropathy or other focal bone abnormality. Soft tissues are unremarkable. IMPRESSION: Negative. Electronically Signed   By: Kerby Moors M.D.   On: 01/01/2021 15:54   CT Maxillofacial Wo Contrast  Result Date: 01/01/2021 CLINICAL DATA:  Facial trauma EXAM: CT HEAD WITHOUT CONTRAST CT MAXILLOFACIAL WITHOUT CONTRAST TECHNIQUE: Multidetector CT imaging of the head and maxillofacial structures were performed using the standard protocol without intravenous contrast. Multiplanar CT image reconstructions of the maxillofacial structures were also generated. COMPARISON:  None. FINDINGS: CT HEAD FINDINGS Brain: There is no acute intracranial hemorrhage, mass  effect, or edema. Gray-white differentiation is preserved. Ventricles and sulci are within normal limits in size and configuration. There is a hyperdense 1.3 cm mass likely rising from the inferior falx and most consistent with a meningioma. Abuts the inferior sagittal sinus. Vascular: No hyperdense vessel or unexpected calcification.  Skull: Unremarkable. Other: Frontal scalp soft tissue swelling. Mastoid air cells are clear. CT MAXILLOFACIAL FINDINGS Osseous: No acute facial fracture. Orbits: No intraorbital hematoma. Sinuses: Small left maxillary sinus retention cyst. Soft tissues: Left anterior inferior frontal scalp soft tissue swelling. IMPRESSION: No evidence of acute intracranial injury.  No acute facial fracture. 1.3 cm meningioma of the inferior falx. Electronically Signed   By: Macy Mis M.D.   On: 01/01/2021 16:04    Procedures .Marland KitchenLaceration Repair  Date/Time: 01/01/2021 6:45 PM Performed by: Marcello Fennel, PA-C Authorized by: Marcello Fennel, PA-C   Consent:    Consent obtained:  Verbal   Consent given by:  Patient   Risks discussed:  Infection, pain, retained foreign body, need for additional repair, poor cosmetic result, tendon damage, vascular damage, poor wound healing and nerve damage   Alternatives discussed:  No treatment, delayed treatment, observation and referral Universal protocol:    Patient identity confirmed:  Verbally with patient Anesthesia:    Anesthesia method:  Local infiltration   Local anesthetic:  Lidocaine 1% w/o epi Laceration details:    Location:  Face   Face location:  L eyebrow   Length (cm):  4   Depth (mm):  2 Pre-procedure details:    Preparation:  Patient was prepped and draped in usual sterile fashion and imaging obtained to evaluate for foreign bodies Exploration:    Imaging obtained: x-ray     Imaging outcome: foreign body not noted     Wound exploration: wound explored through full range of motion and entire depth of wound  visualized     Contaminated: no   Treatment:    Area cleansed with:  Saline   Amount of cleaning:  Extensive   Irrigation solution:  Sterile saline   Visualized foreign bodies/material removed: no     Debridement:  None Skin repair:    Repair method:  Sutures   Suture size:  6-0   Suture technique:  Simple interrupted   Number of sutures:  4 Approximation:    Approximation:  Close Repair type:    Repair type:  Simple Post-procedure details:    Dressing:  Open (no dressing)   Procedure completion:  Tolerated well, no immediate complications Comments:     [name]   Medications Ordered in ED Medications  oxyCODONE-acetaminophen (PERCOCET/ROXICET) 5-325 MG per tablet 1 tablet (1 tablet Oral Given 01/01/21 1750)  lidocaine (PF) (XYLOCAINE) 1 % injection 5 mL (5 mLs Infiltration Given by Other 01/01/21 1810)  Tdap (BOOSTRIX) injection 0.5 mL (0.5 mLs Intramuscular Given 01/01/21 1751)    ED Course  I have reviewed the triage vital signs and the nursing notes.  Pertinent labs & imaging results that were available during my care of the patient were reviewed by me and considered in my medical decision making (see chart for details).    MDM Rules/Calculators/A&P                          Initial impression-presents after a fall.  He is alert, no acute distress, will obtain imaging of his head, face, hand and wrist.  Also recommend suturing for improved wound healing.  Work-up-CT head and face were both negative for acute findings, wrist and finger also negative for acute findings.  Reassessment Will recommend suturing to decrease infection risk and to assist with the healing process.  Patient was agreeable with this and tolerated the procedure well.  He received 4 sutures.  Rule out- low suspicion for intracranial head bleed as patient denies loss of conscious, is not on anticoagulant, she does not endorse headaches, paresthesia/weakness in the upper and lower extremities, no focal  deficits present on my exam CT imaging negative for acute findings.  Low suspicion for spinal cord abnormality or spinal fracture spine was palpated was nontender to palpation, patient has full range of motion in the upper and lower extremities.  Low suspicion for pneumothorax as lung sounds are clear bilaterally, will defer imaging at this time. Low suspicion for intra-abdominal trauma as abdomen soft nontender to palpation.  Low suspicion for orthopedic injury as imaging is negative for acute findings.   Plan-  Laceration-patient had sutures placed, will recommend basic wound care, follow-up next 3 to 5 days for suture removal.  Antibiotics will defer at this time as wound is thoroughly rinsed out, not immunocompromised. Wrist pain-likely secondary to a strain, place him a brace, will have him follow-up with his PCP and/or orthopedic surgery in 2 weeks times have not fully resolved.  Vital signs have remained stable, no indication for hospital admission.  Patient discussed with attending and they agreed with assessment and plan.  Patient given at home care as well strict return precautions.  Patient verbalized that they understood agreed to said plan.  Final Clinical Impression(s) / ED Diagnoses Final diagnoses:  Fall, initial encounter    Rx / DC Orders ED Discharge Orders     None        Marcello Fennel, PA-C 01/01/21 1846    Daleen Bo, MD 01/02/21 1230

## 2021-01-01 NOTE — ED Provider Notes (Signed)
  Face-to-face evaluation   History: He presents for evaluation of injuries from fall.  He asked he tripped on a pallet and fell striking his head, right wrist, left hand and left knee.  He was able ambulate afterwards.  He denies loss of consciousness.  He cannot recall his last tetanus booster.  There is no prodrome and no recent illnesses.  Physical exam: Older male, alert, somewhat overweight.  No dysarthria or aphasia.  Contusion left forehead with laceration left eyebrow.  Mild pain and swelling wrist bilaterally.  Contusion abrasion left knee.  No dysarthria or aphasia  Medical screening examination/treatment/procedure(s) were conducted as a shared visit with non-physician practitioner(s) and myself.  I personally evaluated the patient during the encounter    Daleen Bo, MD 01/02/21 1230

## 2021-01-12 ENCOUNTER — Ambulatory Visit: Admit: 2021-01-12 | Discharge: 2021-01-17 | Payer: Medicare (Managed Care)

## 2021-01-12 DIAGNOSIS — R221 Localized swelling, mass and lump, neck: Secondary | ICD-10-CM

## 2021-01-12 NOTE — Unmapped (Signed)
[facility]  DIVISION OF OTOLARYNGOLOGY- HEAD & NECK SURGERY      Patient Name: [name]  Medical Record Number:  [id number]  Primary Care Physician:  Vevelyn Francois, DO  Date of Consultation: 01/11/2021    Chief Complaint: No chief complaint on file.        Cancer Staging   No matching staging information was found for the patient.    History of Diagnosis and Treatment:     HISTORY OF PRESENT ILLNESS  [name] is a(n) 65 y.o. 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  Most recently, he had an ultrasound with Dr. Delfino Lovett in June 2020 that did not demonstrate or mass or anything to biopsy.  He is here today for a follow up evaluation.        Patient Active Problem List   Diagnosis   ??? Personal history of other specified diseases(V13.89)   ??? Gastroesophageal reflux disease without esophagitis   ??? Abnormal liver enzymes   ??? Hiatal hernia   ??? 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 Ischemic stroke (CMS Dx)   ??? 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 Essential hypertension   ??? Type 2 diabetes mellitus with circulatory disorder (HCC)   ??? Chronic obstructive pulmonary disease (CMS Dx)   ??? Spondylosis of cervical region without myelopathy or radiculopathy   ??? Screen for colon cancer   ??? Lumbar radiculopathy     Past Surgical History:   Procedure Laterality Date   ??? ESOPHAGOGASTRODUODENOSCOPY N/A 11/19/2013    Procedure: ESOPHAGOGASTRODUODENOSCOPY WITH MAC;  Surgeon: Charlane Ferretti, MD;  Location: [facility] END[facility];  Service: Gastroenterology;  Laterality: N/A;   ??? FOOT SURGERY Left    ??? HERNIA REPAIR      about 5 yrs ago     No family history on file.  Social History     Socioeconomic History   ??? Marital  status: Legally Separated     Spouse name: Not on file   ??? Number of children: Not on file   ??? Years of education: Not on file   ??? Highest education level: Not on file   Occupational History   ??? Not on file   Tobacco Use   ??? Smoking status: Every Day     Packs/day: 0.25     Years: 5.00     Pack years: 1.25     Types: Cigarettes   ??? Smokeless tobacco: Never   Substance and Sexual Activity   ??? Alcohol use: No   ??? Drug use: No   ??? Sexual activity: Yes     Partners: Female     Birth control/protection: Condom   Other Topics Concern   ??? Caffeine Use Yes   ??? Occupational Exposure Not Asked   ??? Exercise No   ??? Seat Belt No   Social History Narrative   ??? Not on file     Social Determinants of Health     Financial Resource Strain: Not on file   Physical Activity: Not on file   Stress: Not on file   Social Connections: Not on file   Housing Stability: Not on file  I have reviewed the past, family and social history that the patient filled out on 01/11/2021      DRUG/FOOD ALLERGIES: Penicillins    CURRENT MEDICATIONS  Prior to Admission medications    Medication Sig Start Date End Date Taking? Authorizing Provider   ALBUTEROL INHL Inhale into the lungs.    Historical Provider, MD   amLODIPine (NORVASC) 10 MG tablet Take 10 mg by mouth daily    Historical Provider, MD   aspirin 81 MG EC tablet Take 1 tablet (81 mg total) by mouth daily. 07/31/15   Ellwood Sayers, MD   atorvastatin (LIPITOR) 80 MG tablet Take 1 tablet (80 mg total) by mouth at bedtime. 07/31/15   Ellwood Sayers, MD   benzonatate (TESSALON PERLES) 100 MG capsule Take 1 capsule (100 mg total) by mouth 3 times a day as needed for Cough. 04/19/20   Evangeline Gula, PA   blood sugar diagnostic Strp Test daily 06/14/11   Historical Provider, MD   blood-glucose meter Misc by Does not apply route. 06/14/11   Historical Provider, MD   buPROPion SR (WELLBUTRIN SR) 100 MG tablet Take 100 mg by mouth 2 times daily Ran out of prescription    Historical Provider, MD    entecavir (BARACLUDE) 0.5 MG tablet Take 1 tablet (0.5 mg total) by mouth daily. 08/13/16   Boykin Nearing, MD   FLUoxetine (PROZAC) 20 MG capsule  03/18/14   Historical Provider, MD   hydroCHLOROthiazide (MICROZIDE) 12.5 mg capsule Take 1 capsule (12.5 mg total) by mouth daily. 07/31/15   Ellwood Sayers, MD   ibuprofen (ADVIL,MOTRIN) 800 MG tablet Take 0.5 tablets (400 mg total) by mouth every 6 hours as needed for up to 16 doses. 02/24/15   Arville Go, MD   ketoconazole (NIZORAL) 2 % cream  02/15/14   Historical Provider, MD   loratadine 10 mg Cap Take 1 tablet by mouth daily    Historical Provider, MD   losartan (COZAAR) 100 MG tablet Take 1 tablet (100 mg total) by mouth daily. Take 100 mg by mouth daily 07/31/15   Ellwood Sayers, MD   metFORMIN (GLUCOPHAGE) 500 MG tablet Take 500 mg by mouth 2 times a day with meals.    Historical Provider, MD   ondansetron (ZOFRAN ODT) 4 MG disintegrating tablet Take 1 tablet (4 mg total) by mouth every 6 hours as needed for Nausea. 04/27/14   Alyse Low, MD   orphenadrine (NORFLEX) 100 mg tablet Take 1 tablet (100 mg total) by mouth 2 times a day. 02/24/15   Arville Go, MD   oxybutynin (DITROPAN-XL) 10 MG 24 hr tablet Take 1 tablet (10 mg total) by mouth daily. 01/05/20   Loel Dubonnet, PA   oxyCODONE (ROXICODONE) 5 MG immediate release tablet Take 1 tablet (5 mg total) by mouth every 4 hours as needed for Pain. 03/26/14   Isabel Caprice, MD   oxyCODONE-acetaminophen (PERCOCET) 10-325 mg per tablet 1 tablet every 4 hours as needed.    10/26/13   Historical Provider, MD   tamsulosin (FLOMAX) 0.4 mg Cap Take 1 capsule (0.4 mg total) by mouth at bedtime. Take 0.4 mg by mouth daily 01/05/20   Loel Dubonnet, PA   traZODone (DESYREL) 100 MG tablet  03/18/14   Historical Provider, MD       REVIEW OF SYSTEMS  The following systems were reviewed and revealed the following in addition to any already discussed in the HPI:  Please see scanned ROS that I have  reviewed personally on  01/11/2021    PHYSICAL EXAM  There were no vitals taken for this visit.    Pain    Pain Score: Eight (12/19/2020  3:22 PM)       He denies presence of pain.  Pain will be reassessed at next visit.       GENERAL: No Acute Distress, Conversant  NOSE: No epistaxis, nasal mucosa within normal limits, no purulent drainage  EARS: Normal EAC, MEA   FACE: Facial nerve normal yes  Right subq mass consistent with a sebaceous cyst    ASSESSMENT/PLAN      [name] is a very pleasant 65 y.o. male with right neck mass  -fu for in office removal.

## 2021-02-05 ENCOUNTER — Ambulatory Visit: Admit: 2021-02-05 | Discharge: 2021-02-05 | Payer: Medicare (Managed Care) | Attending: Pulmonary Disease

## 2021-02-05 DIAGNOSIS — R918 Other nonspecific abnormal finding of lung field: Secondary | ICD-10-CM

## 2021-02-05 MED ORDER — tiotropium (SPIRIVA) 18 mcg
18 | ORAL_CAPSULE | Freq: Every day | RESPIRATORY_TRACT | 11 refills | 30.00000 days | Status: AC
Start: 2021-02-05 — End: 2021-04-06

## 2021-02-05 MED ORDER — nicotine, polacrilex, (NICORETTE) 2 mg lzmn
2 | BUCCAL | 4 refills | Status: AC | PRN
Start: 2021-02-05 — End: 2021-03-07

## 2021-02-05 NOTE — Unmapped (Signed)
History of Present Illness:      [name] is a 66 y.o. male.    HPI  [name] is a 66 year old male with PMH of COPD, ischemic stroke without residual deficits, DM2 c/b neuropathy, and GERD who was referred to Pulmonology clinic for COPD by his PCP.    Per patient, he was diagnosed in mid-late 1990s with COPD. He reports he is currently breathing at his baseline and is able to be active without being limited by shortness of breath. He reports his cough with yellow sputum production is at baseline. He denies any exacerbations this year requiring hospitalization, outpatient prednisone prescription, or increased use of his rescue inhaler. He uses his albuterol rescue inhaler as his routine inhaler and uses about 2-3 times per day, but reports some difficulty between his doses of albuterol. He previously used Symbicort but reported he last used that about five years ago and stopped because he did not feel like it provided much relief for him. He does not use any other medication for his COPD besides the albuterol inhaler. He also has history of OSA and reports previous use of CPAP but has not used in several years as well.    He also reports diffuse body aches/pains especially at joints, as well as neuropathy of bilateral lower extremities. His most concerning symptom at this time is urinary incontinence, reporting that often after going to the bathroom with the sensation to urinate, he will find he has experienced bladder incontinence.    [name] smoking history includes 1/2 pack/day of cigarettes for about 20 years; he denies smoking more than 1/2 pack per day in the past. He denies smokeless tobacco and vaping use. He is interested in quitting tobacco. He denies having tried nicotine replacement therapy in the past and is interested in trying this. He does not take any medications except for his albuterol inhaler and occasionally his amlodipine for blood pressure.    He lives at home with one  dog and no other pets. He previously worked as a Doctor, hospital, but is now retired. He denies illicit drug use. He drink 1-2 beers/week.         The following portions of the patient's history were reviewed as appropriate:  allergies, current medications, past family history, past medical history, past social history, past surgical history, problem list  and review of systems    Review of Systems   Constitution: Positive for fatigue. Negative for chills and fever.   HENT: Positive for rhinorrhea. Negative for hearing loss, congestion, postnasal drip, sinus pressure, sore throat and trouble swallowing.   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   Respiratory: Positive for cough (with sputum production, at baseline). Negative for chest tightness, shortness of breath and wheezing.   Cardiovascular: Negative for chest pain, leg swelling and palpitations.   Gastrointestinal: Positive for heartburn. Negative for abdominal pain, constipation, diarrhea, nausea and vomiting.   Skin: Negative for color change and rash.   Musculoskeletal: Positive for arthralgias, back pain and myalgias.   Neurological: Positive for numbness (BLE neuropathy). Negative for headaches.       Objective:   Blood pressure (!) 160/106, pulse 100, temperature 97.4 ??F (36.3 ??C), temperature source Oral, resp. rate 18, height 5' 11 (1.803 m), weight 181 lb (82.1 kg), SpO2 97 %.    Physical Exam  Constitutional:       General: He is not in acute distress.     Appearance: He is  not ill-appearing.   HENT:      Head: Normocephalic and atraumatic.   Eyes:      General: No scleral icterus.        Right eye: No discharge.         Left eye: No discharge.      Comments: Some redness of conjunctiva of right eye   Cardiovascular:      Rate and Rhythm: Normal rate and regular rhythm.      Heart sounds: No murmur heard.    No friction rub. No gallop.   Pulmonary:      Effort: Pulmonary effort is normal. No respiratory distress.      Breath sounds: No  wheezing, rhonchi or rales.      Comments: Good air movement appreciated.  Abdominal:      General: There is no distension.      Palpations: Abdomen is soft.      Tenderness: There is no abdominal tenderness.   Musculoskeletal:         General: No deformity or signs of injury.      Cervical back: Neck supple.      Right lower leg: No edema.      Left lower leg: No edema.   Skin:     General: Skin is warm and dry.      Findings: No rash.   Neurological:      Mental Status: He is alert.      Comments: CN grossly intact, no focal deficits appreciated. Gait normal.   Psychiatric:         Mood and Affect: Mood normal.         Thought Content: Thought content normal.         Judgment: Judgment normal.         Lab Review:   not applicable and   Admission on 12/19/2020, Discharged on 12/19/2020   Component Date Value   ??? Sodium 12/19/2020 138    ??? Potassium 12/19/2020 3.9    ??? Chloride 12/19/2020 104    ??? CO2 12/19/2020 28    ??? Anion Gap 12/19/2020 6    ??? BUN 12/19/2020 12    ??? Creatinine 12/19/2020 1.30    ??? Glucose 12/19/2020 101 (H)    ??? Calcium 12/19/2020 9.5    ??? Osmolality, Calculated 12/19/2020 286    ??? EGFR 12/19/2020 61    ??? WBC 12/19/2020 6.8    ??? RBC 12/19/2020 4.82    ??? Hemoglobin 12/19/2020 15.0    ??? Hematocrit 12/19/2020 44.1    ??? MCV 12/19/2020 91.3    ??? [facility] 12/19/2020 31.1    ??? MCHC 12/19/2020 34.1    ??? RDW 12/19/2020 14.3    ??? Platelets 12/19/2020 184    ??? MPV 12/19/2020 8.9    ??? Neutrophils Relative 12/19/2020 74.3    ??? Lymphocytes Relative 12/19/2020 17.8    ??? Monocytes Relative 12/19/2020 7.0    ??? Eosinophils Relative 12/19/2020 0.4    ??? Basophils Relative 12/19/2020 0.5    ??? nRBC 12/19/2020 0    ??? Neutrophils Absolute 12/19/2020 5,052    ??? Lymphocytes Absolute 12/19/2020 1,210    ??? Monocytes Absolute 12/19/2020 476    ??? Eosinophils Absolute 12/19/2020 27    ??? Basophils Absolute 12/19/2020 34    ??? Total Bilirubin 12/19/2020 0.9    ??? Bilirubin, Direct 12/19/2020 0.16    ??? AST 12/19/2020 8 (L)    ??? ALT  12/19/2020 4 (L)    ???  Alkaline Phosphatase 12/19/2020 81    ??? Total Protein 12/19/2020 7.2    ??? Albumin 12/19/2020 4.2    ??? Bilirubin, Indirect 12/19/2020 0.74    ??? Lipase 12/19/2020 5    ??? High Sensitivity Troponin 12/19/2020 7    ??? Color, UA 12/19/2020 Yellow    ??? Clarity, UA 12/19/2020 Clear    ??? Specific Gravity, UA 12/19/2020 1.015    ??? pH, UA 12/19/2020 7.5    ??? Protein, UA 12/19/2020 30 (A)    ??? Glucose, UA 12/19/2020 Negative    ??? Ketones, UA 12/19/2020 Negative    ??? Bilirubin, UA 12/19/2020 Negative    ??? Blood, UA 12/19/2020 Negative    ??? Nitrite, UA 12/19/2020 Negative    ??? Urobilinogen, UA 12/19/2020 <2.0    ??? Leukocytes, UA 12/19/2020 Negative    ??? High Sensitivity Troponin 12/19/2020 7    ??? RBC, UA 12/19/2020 1    ??? WBC, UA 12/19/2020 7 (H)    ??? Squam Epithel, UA 12/19/2020 3    ??? Bacteria, UA 12/19/2020 Rare (A)    ??? Hyaline Casts, UA 12/19/2020 10 (H)        Prior Diagnostic Testing:  Results for orders placed during the hospital encounter of 01/24/17      XR PORTABLE CHEST    Narrative  EXAM: XR PORTABLE CHEST    INDICATION: Altered mental status, unspecified    TECHNIQUE: 1 view of the chest.    COMPARISON: 04/18/2020    FINDINGS:  Medical Devices: None.    Heart and Mediastinum: Cardiomediastinal silhouette is within normal limits.    Lungs and Pleura: Minimal left basilar streaky opacities, likely atelectasis. Lungs are otherwise clear. No pneumothorax or pleural effusion.    Bones and soft tissues: No acute abnormalities.    Impression  No acute cardiopulmonary abnormality.    Approved by Raynelle Highland, MD on 12/19/2020 5:58 PM EST    I have personally reviewed the images and I agree with this report.    Report Verified by: Melodye Ped, MD at 12/19/2020 6:04 PM EST    Signed by: Melodye Ped, MD on 12/19/2020  6:04 PM, , Results for orders placed during the hospital encounter of 11/20/16    CT CHEST WITH IV CONTRAST    Narrative  EXAM: CT CHEST WITH IV CONTRAST dated 11/20/2016 9:49 AM  EDT    CLINICAL HISTORY: Neoplasm of unspecified behavior of respiratory system    COMPARISON: 09/24/2016    TECHNIQUE: Multidetector CT imaging was obtained through the chest in the supine position after the administration of intravenous contrast. The images were reconstructed with 5 and 1 mm collimation. Additional axial MIP images were reconstructed.    CONTRAST: 100 mL of IOHEXOL 350 MG IODINE/ML INTRAVENOUS SOLUTION    FINDINGS:    HEART: The heart is normal in size. No significant pericardial effusion. Mild coronary artery calcifications.    THORACIC AORTA: Normal caliber and contour. There are scattered calcified atherosclerotic plaques noted throughout the aorta and its branches.    PULMONARY ARTERIES: Normal in caliber.    MEDIASTINUM AND HILA: No pathologically enlarged lymph nodes are identified.    PLEURA: No pleural effusions or pneumothorax.    AIRWAYS & LUNGS:    The central airways are patent. There is mild diffuse bronchial wall thickening. Moderate upper lung zone predominant emphysema persists.    Minimal subpleural opacities in the lower lobes are likely atelectatic. There is mild scarring in the medial right middle lobe.  Lungs are otherwise clear.    A few scattered small pulmonary nodules are unchanged from the prior exam of August 2018. For example, a 3 mm nodule in the inferior right upper lobe (series 5, image 219). Subtle groundglass nodule measuring 4 mm in the medial right upper lobe (image 125) is also unchanged. Comparison to the more remote exam of February 2006 is limited by differences in slice thickness.    UPPER ABDOMEN: The liver is slightly nodular in contour. A few scattered low-density liver lesions are too small to characterize but likely benign. Multiple cysts are noted at the upper pole the right kidney. Mild nodular thickening of the adrenal glands is unchanged.    MUSCULOSKELETAL: No destructive osseous lesions. Mild bilateral gynecomastia.      IMPRESSION:    A few sub-6  mm pulmonary nodules are unchanged from August 2018 and likely benign in the absence of a known primary malignancy. Given the patient's risk factors for lung cancer, current guidelines recommend an optional follow-up CT in one year. This can be performed without intravenous contrast.    Approved by Leretha Pol on 11/20/2016 12:08 PM EDT    I have personally reviewed the images and I agree with this report.    Report Verified by: Odie Sera, M.D. at 11/20/2016 12:18 PM EDT    Signed by: Odie Sera, MD on 11/20/2016 12:18 PM,        Assessment:     COPD GOLD A/B  Patient reports no exacerbations this year from his COPD, however has been using his albuterol rescue inhaler as sole therapy 2-3 times per day with insufficient breathing relief in between the doses.  He reports overall his breathing, cough, and sputum production have been unchanged. Patient previously was on Symbicort but stopped taking about five years ago. Spiriva was discussed today including its long-lasting effect compared to the short action of albuterol and patient was open to starting this.    Tobacco use disorder  Patient reports a tobacco history of at least 10 pack-years of cigarettes. He feels ready to quit at this time and is interested in NRT. He qualifies for lung cancer screening given age, sex, and smoking history and does not appear to have had a CT chest since 2018.    Plan:     - LAMA (Spiriva) ordered.  - To continue albuterol PRN as rescue inhaler only.  - Nicotine replacement lozenges ordered.  - Low-dose CT chest ordered for lung cancer screening.    Judithe Modest, MD  Internal Medicine PGY-1  02/05/2021 3:32 PM     I agree with the resident's assessment and plan as stated. I performed each aspect of the visit independently and can confirm her findings. Thurnell Garbe MD         Number and Complexity of Problems (Level 4)  During this encounter, I addressed 2 or more stable chronic illnesses    Amount and/or Complexity of Data  Ordered, Reviewed, or Analyzed (Level 2)  I reviewed 1 test(s) including:Pror CT scan.        Risk of Complication and/or Morbidity or Mortality of Patient Management (Level  4)  The patient's management entails Moderate risk of complications and/or morbidity or mortality.    Overall LOS:  4

## 2021-03-01 DIAGNOSIS — S42022A Displaced fracture of shaft of left clavicle, initial encounter for closed fracture: Secondary | ICD-10-CM | POA: Insufficient documentation

## 2021-03-01 DIAGNOSIS — T1490XA Injury, unspecified, initial encounter: Secondary | ICD-10-CM | POA: Insufficient documentation

## 2021-03-01 DIAGNOSIS — S52501A Unspecified fracture of the lower end of right radius, initial encounter for closed fracture: Secondary | ICD-10-CM | POA: Insufficient documentation

## 2021-03-02 ENCOUNTER — Ambulatory Visit: Payer: Medicare (Managed Care)

## 2021-03-02 NOTE — Unmapped (Deleted)
[facility]  DIVISION OF OTOLARYNGOLOGY- HEAD & NECK SURGERY      Patient Name: [name]  Medical Record Number:  [id number]  Primary Care Physician:  Vevelyn Francois, DO  Date of Consultation: 03/02/2021    Chief Complaint: No chief complaint on file.         Cancer Staging   No matching staging information was found for the patient.    History of Diagnosis and Treatment:     HISTORY OF PRESENT ILLNESS  [name] is a(n) 66 y.o. 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  Most recently, he had an ultrasound with Dr. Delfino Lovett in June 2020 that did not demonstrate or mass or anything to biopsy.  He is here today for a follow up evaluation.    Interval History:  Patient doing OK today.  He is ready for his procedure today.  No new changes or growth since last visit.  ***    Patient Active Problem List   Diagnosis   ??? Personal history of other specified diseases(V13.89)   ??? Gastroesophageal reflux disease without esophagitis   ??? Abnormal liver enzymes   ??? Hiatal hernia   ??? 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 Ischemic stroke (CMS-HCC)   ??? 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 Essential hypertension   ??? Type 2 diabetes mellitus with circulatory disorder (HCC)   ??? Chronic obstructive pulmonary disease (CMS-HCC)   ??? Spondylosis of cervical region without myelopathy or radiculopathy   ??? Screen for colon cancer   ??? Lumbar radiculopathy     Past Surgical History:   Procedure Laterality Date   ??? ESOPHAGOGASTRODUODENOSCOPY N/A 11/19/2013    Procedure: ESOPHAGOGASTRODUODENOSCOPY WITH MAC;  Surgeon: Charlane Ferretti, MD;  Location: [facility] ENDOSCOPY;  Service: Gastroenterology;  Laterality: N/A;   ??? FOOT SURGERY  Left    ??? HERNIA REPAIR      about 5 yrs ago     No family history on file.  Social History     Socioeconomic History   ??? Marital status: Legally Separated     Spouse name: Not on file   ??? Number of children: Not on file   ??? Years of education: Not on file   ??? Highest education level: Not on file   Occupational History   ??? Not on file   Tobacco Use   ??? Smoking status: Every Day     Packs/day: 0.25     Years: 5.00     Pack years: 1.25     Types: Cigarettes   ??? Smokeless tobacco: Never   Substance and Sexual Activity   ??? Alcohol use: No   ??? Drug use: No   ??? Sexual activity: Yes     Partners: Female     Birth control/protection: Condom   Other Topics Concern   ??? Caffeine Use Yes   ??? Occupational Exposure Not Asked   ??? Exercise No   ??? Seat Belt No   Social History Narrative   ??? Not on file     Social Determinants of Health     Financial Resource Strain: Not on file   Physical Activity: Not on file  Stress: Not on file   Social Connections: Not on file   Housing Stability: Not on file       I have reviewed the past, family and social history that the patient filled out on 03/02/2021      DRUG/FOOD ALLERGIES: Penicillins    CURRENT MEDICATIONS  Prior to Admission medications    Medication Sig Start Date End Date Taking? Authorizing Provider   ALBUTEROL INHL Inhale into the lungs.    Historical Provider, MD   amLODIPine (NORVASC) 10 MG tablet Take 10 mg by mouth daily    Historical Provider, MD   aspirin 81 MG EC tablet Take 1 tablet (81 mg total) by mouth daily. 07/31/15   Ellwood Sayers, MD   atorvastatin (LIPITOR) 80 MG tablet Take 1 tablet (80 mg total) by mouth at bedtime. 07/31/15   Ellwood Sayers, MD   benzonatate (TESSALON PERLES) 100 MG capsule Take 1 capsule (100 mg total) by mouth 3 times a day as needed for Cough. 04/19/20   Evangeline Gula, PA   blood sugar diagnostic Strp Test daily 06/14/11   Historical Provider, MD   blood-glucose meter Misc by Does not apply route. 06/14/11   Historical Provider, MD    buPROPion SR (WELLBUTRIN SR) 100 MG tablet Take 100 mg by mouth 2 times daily Ran out of prescription    Historical Provider, MD   entecavir (BARACLUDE) 0.5 MG tablet Take 1 tablet (0.5 mg total) by mouth daily. 08/13/16   Boykin Nearing, MD   FLUoxetine (PROZAC) 20 MG capsule  03/18/14   Historical Provider, MD   hydroCHLOROthiazide (MICROZIDE) 12.5 mg capsule Take 1 capsule (12.5 mg total) by mouth daily. 07/31/15   Ellwood Sayers, MD   ibuprofen (ADVIL,MOTRIN) 800 MG tablet Take 0.5 tablets (400 mg total) by mouth every 6 hours as needed for up to 16 doses. 02/24/15   Arville Go, MD   ketoconazole (NIZORAL) 2 % cream  02/15/14   Historical Provider, MD   loratadine 10 mg Cap Take 1 tablet by mouth daily    Historical Provider, MD   losartan (COZAAR) 100 MG tablet Take 1 tablet (100 mg total) by mouth daily. Take 100 mg by mouth daily 07/31/15   Ellwood Sayers, MD   metFORMIN (GLUCOPHAGE) 500 MG tablet Take 500 mg by mouth 2 times a day with meals.    Historical Provider, MD   ondansetron (ZOFRAN ODT) 4 MG disintegrating tablet Take 1 tablet (4 mg total) by mouth every 6 hours as needed for Nausea. 04/27/14   Alyse Low, MD   orphenadrine (NORFLEX) 100 mg tablet Take 1 tablet (100 mg total) by mouth 2 times a day. 02/24/15   Arville Go, MD   oxybutynin (DITROPAN-XL) 10 MG 24 hr tablet Take 1 tablet (10 mg total) by mouth daily. 01/05/20   Loel Dubonnet, PA   oxyCODONE (ROXICODONE) 5 MG immediate release tablet Take 1 tablet (5 mg total) by mouth every 4 hours as needed for Pain. 03/26/14   Isabel Caprice, MD   oxyCODONE-acetaminophen (PERCOCET) 10-325 mg per tablet 1 tablet every 4 hours as needed.    10/26/13   Historical Provider, MD   tamsulosin (FLOMAX) 0.4 mg Cap Take 1 capsule (0.4 mg total) by mouth at bedtime. Take 0.4 mg by mouth daily 01/05/20   Loel Dubonnet, PA   traZODone (DESYREL) 100 MG tablet  03/18/14   Historical Provider, MD       REVIEW OF SYSTEMS  The following  systems were reviewed and  revealed the following in addition to any already discussed in the HPI:  Please see scanned ROS that I have reviewed personally on 03/02/2021    PHYSICAL EXAM  There were no vitals taken for this visit.    Pain    Pain Score: Zero (01/12/2021  3:46 PM)       He denies presence of pain.  Pain will be reassessed at next visit.    GENERAL: No Acute Distress, Conversant  NOSE: No epistaxis, nasal mucosa within normal limits, no purulent drainage  EARS: Normal EAC, MEA   FACE: Facial nerve normal yes  Right subq mass consistent with a sebaceous cyst    PROCEDURE  Procedure:   Right neck mass excision   Surgeon:   Italy Zender, MD   Indications:   Right neck mass   Description:   After verbal consent obtained and risks/benefits discussed including pain, infection, bleeding, poor wound healing, need for further treatment, and no guarantee of success, patient would like to proceed with excisional biopsy of right neck mass.     The area was anesthetized with 1% lidocaine with 1:100,000 epinepherine. The lesion was sharply dissected out of the *** submucosa after an incision was made with a 15 blade.  scisors were used to then mobilize the minor salivary gland and remove it.  A second biopsy was done then hemostasis was obtained.  Simple closure of the 15mm incision with 3 4-0 chromic.  There was minimal blood loss.  Specimen was submitted.          ASSESSMENT/PLAN      Jagar is a very pleasant 66 y.o. male with right neck mass    - fu on pathology  - atb for 3 days  - tylenol for pain

## 2021-03-06 DIAGNOSIS — I722 Aneurysm of renal artery: Secondary | ICD-10-CM | POA: Insufficient documentation

## 2021-03-06 DIAGNOSIS — S322XXD Fracture of coccyx, subsequent encounter for fracture with routine healing: Secondary | ICD-10-CM | POA: Insufficient documentation

## 2021-03-06 DIAGNOSIS — S2242XA Multiple fractures of ribs, left side, initial encounter for closed fracture: Secondary | ICD-10-CM | POA: Insufficient documentation

## 2021-03-06 DIAGNOSIS — S32009A Unspecified fracture of unspecified lumbar vertebra, initial encounter for closed fracture: Secondary | ICD-10-CM | POA: Insufficient documentation

## 2021-03-06 DIAGNOSIS — S2220XD Unspecified fracture of sternum, subsequent encounter for fracture with routine healing: Secondary | ICD-10-CM | POA: Insufficient documentation

## 2021-03-06 DIAGNOSIS — S5291XA Unspecified fracture of right forearm, initial encounter for closed fracture: Secondary | ICD-10-CM | POA: Insufficient documentation

## 2021-03-18 ENCOUNTER — Emergency Department: Admit: 2021-03-18 | Payer: Medicare (Managed Care)

## 2021-03-18 ENCOUNTER — Emergency Department: Admit: 2021-03-18 | Discharge: 2021-03-27 | Payer: Medicare (Managed Care)

## 2021-03-18 ENCOUNTER — Inpatient Hospital Stay
Admit: 2021-03-18 | Discharge: 2021-03-21 | Discharge status: Against Medical Advice | Payer: Medicare (Managed Care) | Admitting: Neurology

## 2021-03-18 DIAGNOSIS — I63512 Cerebral infarction due to unspecified occlusion or stenosis of left middle cerebral artery: Secondary | ICD-10-CM

## 2021-03-18 LAB — URINALYSIS-MACROSCOPIC W/REFLEX TO MICROSCOPIC
Bilirubin, UA: NEGATIVE
Glucose, UA: NEGATIVE mg/dL
Ketones, UA: NEGATIVE mg/dL
Leukocytes, UA: NEGATIVE
Nitrite, UA: NEGATIVE
Protein, UA: NEGATIVE mg/dL
Specific Gravity, UA: 1.015 (ref 1.005–1.035)
Urobilinogen, UA: 0.2 EU/dL (ref 0.2–1.0)
pH, UA: 7.5 (ref 5.0–8.0)

## 2021-03-18 LAB — VENOUS BLOOD GAS, LINE/SYRINGE
%HBO2-Line Draw: 30.5 % (ref 40.0–70.0)
Base Excess-Line Draw: 4.3 mmol/L (ref <=3.0)
CO2 Content-Line Draw: 34 mmol/L (ref 25–29)
Carboxyhgb-Line Draw: 5.4 % (ref 0.0–2.0)
HCO3-Line Draw: 32 mmol/L (ref 24–28)
Methemoglobin-Line Draw: 0.5 % (ref 0.0–1.5)
PCO2-Line Draw: 56 mm Hg (ref 41–51)
PH-Line Draw: 7.37 (ref 7.32–7.42)
PO2-Line Draw: 20 mm Hg (ref 25–40)
Reduced Hemoglobin-Line Draw: 63.6 % (ref 0.0–5.0)

## 2021-03-18 LAB — ACETAMINOPHEN LEVEL: Acetaminophen Level: <10 ug/mL (ref 10–30)

## 2021-03-18 LAB — BASIC METABOLIC PANEL
Anion Gap: 7 mmol/L (ref 3–16)
BUN: 16 mg/dL (ref 7–25)
CO2: 28 mmol/L (ref 21–33)
Calcium: 9.2 mg/dL (ref 8.6–10.3)
Chloride: 104 mmol/L (ref 98–110)
Creatinine: 1.37 mg/dL (ref 0.60–1.30)
EGFR: 57
Glucose: 112 mg/dL (ref 70–100)
Osmolality, Calculated: 290 mOsm/kg (ref 278–305)
Potassium: 4.1 mmol/L (ref 3.5–5.3)
Sodium: 139 mmol/L (ref 133–146)

## 2021-03-18 LAB — POC GLU MONITORING DEVICE: POC Glucose Monitoring Device: 129 mg/dL (ref 70–100)

## 2021-03-18 LAB — HEPATIC FUNCTION PANEL
ALT: 4 U/L (ref 7–52)
AST: 9 U/L (ref 13–39)
Albumin: 4.5 g/dL (ref 3.5–5.7)
Alkaline Phosphatase: 86 U/L (ref 36–125)
Bilirubin, Direct: 0.2 mg/dL (ref 0.0–0.4)
Bilirubin, Indirect: 0.9 mg/dL (ref 0.0–1.1)
Total Bilirubin: 1.1 mg/dL (ref 0.0–1.5)
Total Protein: 7.4 g/dL (ref 6.4–8.9)

## 2021-03-18 LAB — CBC
Hematocrit: 45.9 % (ref 38.5–50.0)
Hemoglobin: 16 g/dL (ref 13.2–17.1)
MCH: 32 pg (ref 27.0–33.0)
MCHC: 34.8 g/dL (ref 32.0–36.0)
MCV: 91.9 fL (ref 80.0–100.0)
MPV: 8.8 fL (ref 7.5–11.5)
Platelets: 192 10*3/uL (ref 140–400)
RBC: 5 10*6/uL (ref 4.20–5.80)
RDW: 14.3 % (ref 11.0–15.0)
WBC: 7.1 10*3/uL (ref 3.8–10.8)

## 2021-03-18 LAB — PROTIME-INR
INR: 0.9 (ref 0.9–1.1)
Protime: 12.8 seconds (ref 12.1–15.1)

## 2021-03-18 LAB — AMMONIA: Ammonia: 69 ug/dL (ref 27–90)

## 2021-03-18 LAB — URINALYSIS, MICROSCOPIC
RBC, UA: 2 /HPF (ref 0–3)
Squam Epithel, UA: 1 /HPF (ref 0–5)
WBC, UA: 1 /HPF (ref 0–5)

## 2021-03-18 LAB — HIGH SENSITIVITY TROPONIN: High Sensitivity Troponin: 7 ng/L (ref 0–20)

## 2021-03-18 LAB — SALICYLATE LEVEL: Salicylate Lvl: <3 mg/dL (ref 10–30)

## 2021-03-18 LAB — ETHANOL, SERUM: Ethanol: <10 mg/dL (ref 0–10)

## 2021-03-18 MED ORDER — sugammadex (BRIDION) 100 mg/mL IV solution
100 | INTRAVENOUS | Status: AC
Start: 2021-03-18 — End: ?

## 2021-03-18 MED ORDER — midazolam (PF) (VERSED) 1 mg/mL injection
1 | INTRAMUSCULAR | Status: AC
Start: 2021-03-18 — End: ?

## 2021-03-18 MED ORDER — dextrose 10%-water (D10W) IV soln
INTRAVENOUS | Status: AC | PRN
Start: 2021-03-18 — End: 2021-03-21

## 2021-03-18 MED ORDER — OMNIPAQUE (iohexol) 350 mg iodine/mL 100 mL
350 | Freq: Once | INTRAVENOUS | Status: AC | PRN
Start: 2021-03-18 — End: 2021-03-18
  Administered 2021-03-18: 20:00:00 100 mL via INTRAVENOUS

## 2021-03-18 MED ORDER — succinylcholine (QUELICIN) injection
20 | INTRAMUSCULAR | PRN
Start: 2021-03-18 — End: 2021-03-18
  Administered 2021-03-18: 22:00:00 120 via INTRAVENOUS

## 2021-03-18 MED ORDER — fentaNYL (SUBLIMAZE) 50 mcg/mL injection
50 | INTRAMUSCULAR | Status: AC
Start: 2021-03-18 — End: ?

## 2021-03-18 MED ORDER — nitroGLYCERIN 50 mg in D5W 250 mL 50 mg/250 mL (200 mcg/mL) infusion
50 | INTRAVENOUS | Status: AC
Start: 2021-03-18 — End: 2021-03-19

## 2021-03-18 MED ORDER — electrolyte-R (pH 7.4) (NORMOSOL-R pH 7.4) iv solution SolP
INTRAVENOUS | Status: AC | PRN
Start: 2021-03-18 — End: 2021-03-18
  Administered 2021-03-18: 22:00:00 via INTRAVENOUS

## 2021-03-18 MED ORDER — heparin (porcine) injection 5,000 Units
5000 | Freq: Three times a day (TID) | INTRAMUSCULAR
Start: 2021-03-18 — End: 2021-03-21
  Administered 2021-03-19 – 2021-03-20 (×3): 5000 [IU] via SUBCUTANEOUS

## 2021-03-18 MED ORDER — aspirin tablet 325 mg
325 | Freq: Once | ORAL | Status: AC
Start: 2021-03-18 — End: 2021-03-18
  Administered 2021-03-19: 01:00:00 325 mg via ORAL

## 2021-03-18 MED ORDER — propofol 10 mg/ml (DIPRIVAN) injection
10 | INTRAVENOUS | PRN
Start: 2021-03-18 — End: 2021-03-18
  Administered 2021-03-18: 23:00:00 20 via INTRAVENOUS
  Administered 2021-03-18: 23:00:00 50 via INTRAVENOUS
  Administered 2021-03-18: 22:00:00 150 via INTRAVENOUS

## 2021-03-18 MED ORDER — neostigmine methylsulfate (PROSTIGMIN) 1 mg/mL IV solution
1 | INTRAVENOUS | Status: AC
Start: 2021-03-18 — End: ?

## 2021-03-18 MED ORDER — aspirin chewable tablet 81 mg
81 | Freq: Every day | ORAL | Status: AC
Start: 2021-03-18 — End: 2021-03-21
  Administered 2021-03-19 – 2021-03-20 (×2): 81 mg via ORAL

## 2021-03-18 MED ORDER — ondansetron (ZOFRAN) injection
4 | INTRAMUSCULAR | PRN
Start: 2021-03-18 — End: 2021-03-18
  Administered 2021-03-18: 23:00:00 4 via INTRAVENOUS

## 2021-03-18 MED ORDER — niCARdipine (CARDENE) 25 mg/10 mL injection
25 | INTRAVENOUS | Status: AC
Start: 2021-03-18 — End: ?

## 2021-03-18 MED ORDER — glucose chewable tablet 12 g
4 | ORAL | Status: AC | PRN
Start: 2021-03-18 — End: 2021-03-21

## 2021-03-18 MED ORDER — labetaloL (NORMODYNE) 5 mg/mL injection
5 | INTRAVENOUS | Status: AC
Start: 2021-03-18 — End: ?

## 2021-03-18 MED ORDER — midazolam (PF) (VERSED) injection
1 | INTRAMUSCULAR | Status: AC | PRN
Start: 2021-03-18 — End: 2021-03-18
  Administered 2021-03-18: 22:00:00 2 via INTRAVENOUS

## 2021-03-18 MED ORDER — glycopyrrolate (ROBINUL) 0.2 mg/mL injection
0.2 | INTRAMUSCULAR | Status: AC
Start: 2021-03-18 — End: ?

## 2021-03-18 MED ORDER — niCARdipine 40mg/200mL in sodium chloride
40 | INTRAVENOUS | Status: AC
Start: 2021-03-18 — End: 2021-03-18

## 2021-03-18 MED ORDER — lidocaine (PF) 2% (20 mg/mL) Soln
20 | INTRAMUSCULAR | PRN
Start: 2021-03-18 — End: 2021-03-18
  Administered 2021-03-18: 22:00:00 100 via INTRAVENOUS

## 2021-03-18 MED ORDER — ondansetron (ZOFRAN) 4 mg/2 mL injection
4 | INTRAMUSCULAR | Status: AC
Start: 2021-03-18 — End: ?

## 2021-03-18 MED ORDER — atorvastatin (LIPITOR) tablet 80 mg
80 | Freq: Every evening | ORAL | Status: AC
Start: 2021-03-18 — End: 2021-03-21
  Administered 2021-03-20 – 2021-03-21 (×2): 80 mg via ORAL

## 2021-03-18 MED ORDER — niCARdipine (CARDENE) 50 mg in sodium chloride 0.9 % 250 mL infusion
25 | INTRAVENOUS | PRN
Start: 2021-03-18 — End: 2021-03-18
  Administered 2021-03-18: 23:00:00 5 via INTRAVENOUS

## 2021-03-18 MED ORDER — OMNIPAQUE (iohexol) 350 mg iodine/mL 100 mL
350 | Freq: Once | INTRAVENOUS | Status: AC | PRN
Start: 2021-03-18 — End: 2021-03-18
  Administered 2021-03-18: 21:00:00 80 mL via INTRAVENOUS

## 2021-03-18 MED ORDER — clopidogreL (PLAVIX) tablet 300 mg
75 | Freq: Once | ORAL | Status: AC
Start: 2021-03-18 — End: 2021-03-18
  Administered 2021-03-19: 01:00:00 300 mg via ORAL

## 2021-03-18 MED ORDER — fentaNYL (SUBLIMAZE) injection
50 | INTRAMUSCULAR | Status: AC | PRN
Start: 2021-03-18 — End: 2021-03-18
  Administered 2021-03-18: 23:00:00 50 via INTRAVENOUS

## 2021-03-18 MED ORDER — sugammadex (BRIDION) IV solution
100 | INTRAVENOUS | PRN
Start: 2021-03-18 — End: 2021-03-18
  Administered 2021-03-18: 23:00:00 200 via INTRAVENOUS

## 2021-03-18 MED ORDER — insulin regular (HumuLIN R) injection Soln 0-5 Units
100 | Freq: Four times a day (QID) | INTRAMUSCULAR | Status: AC
Start: 2021-03-18 — End: 2021-03-19

## 2021-03-18 MED ORDER — esmoloL (BREVIBLOC) 100 mg/10 mL (10 mg/mL) injection
100 | INTRAVENOUS | Status: AC
Start: 2021-03-18 — End: ?

## 2021-03-18 MED ORDER — phenylephrine (NEO-SYNEPHRINE) 10 mg/mL injection
10 | INTRAMUSCULAR | Status: AC
Start: 2021-03-18 — End: ?

## 2021-03-18 MED ORDER — haloperidol lactate (HALDOL) injection 2 mg
5 | Freq: Once | INTRAMUSCULAR | Status: AC | PRN
Start: 2021-03-18 — End: 2021-03-18
  Administered 2021-03-19: 2 mg via INTRAVENOUS

## 2021-03-18 MED ORDER — clopidogreL (PLAVIX) tablet 75 mg
75 | Freq: Every day | ORAL | Status: AC
Start: 2021-03-18 — End: 2021-03-19

## 2021-03-18 MED ORDER — esmoloL (BREVIBLOC) injection
100 | INTRAVENOUS | PRN
Start: 2021-03-18 — End: 2021-03-18
  Administered 2021-03-18: 23:00:00 10 via INTRAVENOUS
  Administered 2021-03-18: 23:00:00 20 via INTRAVENOUS

## 2021-03-18 MED ORDER — rocuronium (ZEMURON) injection
10 | INTRAVENOUS | PRN
Start: 2021-03-18 — End: 2021-03-18
  Administered 2021-03-18: 22:00:00 40 via INTRAVENOUS

## 2021-03-18 MED FILL — BRIDION 100 MG/ML INTRAVENOUS SOLUTION: 100 100 mg/mL | INTRAVENOUS | Qty: 2

## 2021-03-18 MED FILL — ONDANSETRON HCL (PF) 4 MG/2 ML INJECTION SOLUTION: 4 4 mg/2 mL | INTRAMUSCULAR | Qty: 2

## 2021-03-18 MED FILL — NITROGLYCERIN 50 MG/250 ML (200 MCG/ML) IN 5 % DEXTROSE INTRAVENOUS: 50 50 mg/250 mL (200 mcg/mL) | INTRAVENOUS | Qty: 250

## 2021-03-18 MED FILL — ASPIRIN 325 MG TABLET: 325 325 MG | ORAL | Qty: 1

## 2021-03-18 MED FILL — GLYCOPYRROLATE 0.2 MG/ML INJECTION SOLUTION: 0.2 0.2 mg/mL | INTRAMUSCULAR | Qty: 2

## 2021-03-18 MED FILL — OMNIPAQUE 350 MG IODINE/ML INTRAVENOUS SOLUTION: 350 350 mg iodine/mL | INTRAVENOUS | Qty: 100

## 2021-03-18 MED FILL — ESMOLOL 100 MG/10 ML (10 MG/ML) INTRAVENOUS SOLUTION: 100 100 mg/10 mL (10 mg/mL) | INTRAVENOUS | Qty: 10

## 2021-03-18 MED FILL — MIDAZOLAM (PF) 1 MG/ML INJECTION SOLUTION: 1 1 mg/mL | INTRAMUSCULAR | Qty: 2

## 2021-03-18 MED FILL — CLOPIDOGREL 75 MG TABLET: 75 75 mg | ORAL | Qty: 4

## 2021-03-18 MED FILL — PHENYLEPHRINE 10 MG/ML INJECTION SOLUTION: 10 10 mg/mL | INTRAMUSCULAR | Qty: 1

## 2021-03-18 MED FILL — LABETALOL 5 MG/ML INTRAVENOUS SOLUTION: 5 5 mg/mL | INTRAVENOUS | Qty: 20

## 2021-03-18 MED FILL — FENTANYL (PF) 50 MCG/ML INJECTION SOLUTION: 50 50 mcg/mL | INTRAMUSCULAR | Qty: 2

## 2021-03-18 MED FILL — NEOSTIGMINE METHYLSULFATE 1 MG/ML INTRAVENOUS SOLUTION: 1 1 mg/mL | INTRAVENOUS | Qty: 10

## 2021-03-18 MED FILL — HALOPERIDOL LACTATE 5 MG/ML INJECTION SOLUTION: 5 5 mg/mL | INTRAMUSCULAR | Qty: 1

## 2021-03-18 MED FILL — NICARDIPINE 25 MG/10 ML INTRAVENOUS SOLUTION: 25 25 mg/10 mL | INTRAVENOUS | Qty: 10

## 2021-03-18 NOTE — Unmapped (Signed)
Anesthesia Transfer of Care Note    Patient: [name]  Procedure(s) Performed: * No procedures listed *    Patient location: ICU    Anesthesia type: No value filed.    Airway Device on Arrival to PACU/ICU: Nasal Cannula    IV Access: Peripheral    Monitors Recommended to be Used During PACU/ICU: Standard Monitors    Outstanding Issues to Address: None    Level of Consciousness: awake    Post vital signs:    Vitals:    03/18/21 1830   BP: (!) 146/99   Pulse: 84   Resp: 14   Temp:    SpO2: 98%       Complications:  No notable events documented.

## 2021-03-18 NOTE — Unmapped (Signed)
[facility]    Trauma / Critically Ill Assessment      NILSON TABORA  [id number]    Reason for Referral / Presenting Problem:   Stroke symptoms  Family Contact and Involvement:  Girlfriend Achor, [name] [phone]  Deniece Ree sister [phone]  Assessment and Social Work Interventions:  Patient is a 66 year male who presented with stroke like symptoms. Reported that Patient's girlfriend stated patient was having word finding difficuty. Patient taken to [facility] for further treatment.   Safety Concerns:  na  Referral / Disposition Plan:      Pt admitted to [facility] for further evaluation.   Caffie Damme LISW-S  [facility]   Medical Social Worker  [phone]

## 2021-03-18 NOTE — Unmapped (Signed)
Anesthesia Post Note    Patient: Danny PartridgeJohnny O Sweetman    Procedure(s) Performed: * IR ANGIO*    Anesthesia type: general    Patient location: ICU    Airway: Patent    Post pain: Adequate analgesia    Nausea / Vomiting: Absent    Post-operative Hydration Status: Adequate    Post assessment: no apparent anesthetic complications    Last Vitals:   Vitals:    03/18/21 1500 03/18/21 1515 03/18/21 1829 03/18/21 1830   BP: (!) 187/98 189/88 (!) 146/99 (!) 146/99   BP Location:  Left arm Left arm    Patient Position:  Lying Lying    Pulse: 85 86 84 84   Resp: 20 18 13 14    Temp:  97.6 ??F (36.4 ??C) 97.4 ??F (36.3 ??C)    TempSrc:  Oral Oral    SpO2: 100% 98% 92% 98%        Post vital signs: stable    Level of consciousness: awake    Complications:  No notable events documented.

## 2021-03-18 NOTE — Unmapped (Signed)
[place]  DEPARTMENT OF ANESTHESIOLOGY  PRE-PROCEDURAL EVALUATION    [name] is a 66 y.o. 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    I have reviewed the History and Physical Exam, any relevant changes are noted in the anesthesia pre-operative evaluation.      Cardiovascular:    Hypertension is.      Neuro/Muscoloskeletal/Psych:    (+) neuromuscular disease (lumbar radiculopathy).  CVA.    Pulmonary:     COPD.        GI/Hepatic/Renal:    (+) hiatal hernia and liver disease.  GERD is.  Hepatitis B.      Endo/Other:    Diabetes, type 2.        Past Medical History     Past Medical History:   Diagnosis Date   ??? COPD (chronic obstructive pulmonary disease) (CMS-HCC)    ??? Diabetes mellitus (CMS-HCC)    ??? Dysphagia    ??? GERD (gastroesophageal reflux disease)    ??? Hematuria    ??? Hepatitis    ??? Hypertension        Past Surgical History     Past Surgical History:   Procedure Laterality Date   ??? ESOPHAGOGASTRODUODENOSCOPY N/A 11/19/2013    Procedure: ESOPHAGOGASTRODUODENOSCOPY WITH MAC;  Surgeon: Charlane Ferretti, MD;  Location: [facility] ENDOSCOPY;  Service: Gastroenterology;  Laterality: N/A;   ??? FOOT SURGERY Left    ??? HERNIA REPAIR      about 5 yrs ago       Family History     History reviewed. No pertinent family history.    Social History     Social History     Socioeconomic History   ??? Marital status: Legally Separated     Spouse name: Not on file   ??? Number of children: Not on file   ??? Years of education: Not on file   ??? Highest education level: Not on file   Occupational History   ??? Not on file   Tobacco Use   ??? Smoking status: Every Day     Packs/day: 0.25     Years: 5.00     Pack years: 1.25     Types: Cigarettes   ??? Smokeless tobacco: Never   Substance and Sexual Activity   ???  Alcohol use: No   ??? Drug use: No   ??? Sexual activity: Yes     Partners: Female     Birth control/protection: Condom   Other Topics Concern   ??? Caffeine Use Yes   ??? Occupational Exposure Not Asked   ??? Exercise No   ??? Seat Belt No   Social History Narrative   ??? Not on file     Social Determinants of Health     Financial Resource Strain: Not on file   Physical Activity: Not on file   Stress: Not on file   Social Connections: Not on file   Housing Stability: Not on file       Medications     Allergies:  Allergies   Allergen Reactions   ??? Penicillins Hives       Home Meds:  Prior to  Admission medications as of 03/18/21 1459   Medication Sig Taking?   ALBUTEROL INHL Inhale into the lungs.    amLODIPine (NORVASC) 10 MG tablet Take 10 mg by mouth daily    aspirin 81 MG EC tablet Take 1 tablet (81 mg total) by mouth daily.    atorvastatin (LIPITOR) 80 MG tablet Take 1 tablet (80 mg total) by mouth at bedtime.    benzonatate (TESSALON PERLES) 100 MG capsule Take 1 capsule (100 mg total) by mouth 3 times a day as needed for Cough.    blood sugar diagnostic Strp Test daily    blood-glucose meter Misc by Does not apply route.    buPROPion SR (WELLBUTRIN SR) 100 MG tablet Take 100 mg by mouth 2 times daily Ran out of prescription    entecavir (BARACLUDE) 0.5 MG tablet Take 1 tablet (0.5 mg total) by mouth daily.    FLUoxetine (PROZAC) 20 MG capsule     hydroCHLOROthiazide (MICROZIDE) 12.5 mg capsule Take 1 capsule (12.5 mg total) by mouth daily.    ibuprofen (ADVIL,MOTRIN) 800 MG tablet Take 0.5 tablets (400 mg total) by mouth every 6 hours as needed for up to 16 doses.    ketoconazole (NIZORAL) 2 % cream     loratadine 10 mg Cap Take 1 tablet by mouth daily    losartan (COZAAR) 100 MG tablet Take 1 tablet (100 mg total) by mouth daily. Take 100 mg by mouth daily    metFORMIN (GLUCOPHAGE) 500 MG tablet Take 1 tablet (500 mg total) by mouth 2 times a day with meals.    ondansetron (ZOFRAN ODT) 4 MG disintegrating tablet Take 1  tablet (4 mg total) by mouth every 6 hours as needed for Nausea.    orphenadrine (NORFLEX) 100 mg tablet Take 1 tablet (100 mg total) by mouth 2 times a day.    oxybutynin (DITROPAN-XL) 10 MG 24 hr tablet Take 1 tablet (10 mg total) by mouth daily.    oxyCODONE (ROXICODONE) 5 MG immediate release tablet Take 1 tablet (5 mg total) by mouth every 4 hours as needed for Pain.    oxyCODONE-acetaminophen (PERCOCET) 10-325 mg per tablet 1 tablet every 4 hours as needed.    tamsulosin (FLOMAX) 0.4 mg Cap Take 1 capsule (0.4 mg total) by mouth at bedtime. Take 0.4 mg by mouth daily    tiotropium (SPIRIVA) 18 mcg Inhale 1 capsule (18 mcg total) into the lungs daily.    traZODone (DESYREL) 100 MG tablet         Inpatient Meds:  Scheduled:   ??? nitroGLYCERIN 50 mg in D5W 250 mL         Continuous:     PRN: haloperidol lactate    Vital Signs     Wt Readings from Last 3 Encounters:   02/05/21 181 lb (82.1 kg)   01/12/21 177 lb (80.3 kg)   01/05/20 190 lb (86.2 kg)     Ht Readings from Last 3 Encounters:   02/05/21 5' 11 (1.803 m)   01/12/21 5' 11 (1.803 m)   01/05/20 5' 11 (1.803 m)     Temp Readings from Last 3 Encounters:   03/18/21 97.4 ??F (36.3 ??C) (Oral)   02/05/21 97.4 ??F (36.3 ??C) (Oral)   01/12/21 97.5 ??F (36.4 ??C) (Temporal)     BP Readings from Last 3 Encounters:   03/18/21 (!) 146/99   02/05/21 (!) 160/106   01/12/21 (!) 182/108     Pulse Readings from Last 3 Encounters:  03/18/21 84   02/05/21 100   01/12/21 70     @LASTSAO2 (3)@    Physical Exam     Airway:     Mallampati: III  Mouth Opening: >2 FB  TM distance: > = 3 FB    Dental:        Pulmonary:        Cardiovascular:     Rhythm: regular  Rate: abnormal    Neuro/Musculoskeletal/Psych:    Mental status: alert.          Abdominal:       Current OB Status:       Other Findings:        Laboratory Data     Lab Results   Component Value Date    WBC 7.1 03/18/2021    HGB 16.0 03/18/2021    HCT 45.9 03/18/2021    MCV 91.9 03/18/2021    PLT 192 03/18/2021       No  results found for: [facility]    Lab Results   Component Value Date    GLUCOSE 112 (H) 03/18/2021    BUN 16 03/18/2021    CO2 28 03/18/2021    CREATININE 1.37 (H) 03/18/2021    K 4.1 03/18/2021    NA 139 03/18/2021    CL 104 03/18/2021    CALCIUM 9.2 03/18/2021    ALBUMIN 4.5 03/18/2021    PROT 7.4 03/18/2021    ALKPHOS 86 03/18/2021    ALT 4 (L) 03/18/2021    AST 9 (L) 03/18/2021    BILITOT 1.1 03/18/2021       Lab Results   Component Value Date    INR 0.9 03/18/2021       No results found for: PREGTESTUR, PREGSERUM, HCG, HCGQUANT    Anesthesia Plan     ASA 4 - emergency             Anesthesia Type:  general endotracheal.      PONV Risk Factors:  plan for postoperative opioid use.                                Plan discussed with resident.

## 2021-03-18 NOTE — Unmapped (Signed)
Report given to [facility], IR RN. RN verbalized understanding report with no questions nor concerns. Pt updated on POC and amenable and agreeable to plan.

## 2021-03-18 NOTE — Unmapped (Signed)
Stroke team at bedside

## 2021-03-18 NOTE — Unmapped (Signed)
Interventional Neuro-Radiology  Brief Post-Angio Note    Brief HPI: ??  66 yo M with COPD, DM, prior stroke without deficits, who presents with aphasia NIHSS=12. Dropped off by significant other with report of aphasia starting 3 hours ago. We are unable to reach her to confirm timeline. Family says he has been acting funny and confused for a few days but not clearly aphasic. Imaging with left inferior M2 occlusion. The CTP does not show an ischemic penumbra or infarcted core but his SBP is 194 at time of imaging.   ??  Discussed with stroke team and NOK sister. No lytic with unclear LSN however he has a disabling clinical syndrome which matches the M2 occlusion and no ischemic core. Will proceed with EVT.    Indication: left mca stroke    Procedure:   - diagnostic cerebral angiogram   - R femoral artery closure with 38f angioseal    Contrast Used:  40 ml Visipaque    Findings:  - High grade stenosis of L inferior M2 branch with resolution of intraluminal thrombus    A detailed report will follow separately (see Procedures)    Estimated Blood Loss:  minimal    Complications:  None     Post-procedure Exam (unchanged from prior):     Groin: R groin site c/d/i, with no e/o groin hematoma.  Distal pulses intact    Recommendations:   - R leg flat x 2 hours.  - BP parameters: SBP < 180  - Call numbers below with any post-procedural complications  - See Post-Angio orders      If unable to reach, contact Neuro-IR on call.

## 2021-03-18 NOTE — Unmapped (Signed)
Pt girlfriend bringing pt in stating about 3 hours ago pt started having confusion, aphasia, other stroke like symptoms. Pt able to ambulate and follow motor commands but with word finding difficuty. MD notified and pt taken to ct scanner. Charge aware.

## 2021-03-18 NOTE — Unmapped (Signed)
Neuroscience ICU History and Physical/Consult Note    Neurocritical Care Attending: Mellody Drown  House Officer: Vernona Rieger Anik Wesch  Primary Care Physician: Hendricks Limes DORIOTT, DO  Date of Admission: 03/18/2021    CC:   Aphasia    HPI:   [name] is a 66 year old male with COPD, T2DM, HTN, hepatitis, tobacco use, and history of prior left posterior frontal infarcts s/p TPA (2017) who presented with aphasia and change in baseline.    Patient was dropped at [facility] ED today with report of onset of aphasia for 3 three hours prior and acting off for the last 1-2 days.  Over the days prior, his significant other, [name], noted he was taking longer to answer questions and had to think about what to say.  Three hours prior to arrival in the ER (2/19 @13 :00), his speech abruptly no longer made sense - he didn't seem to know who she was and would answer questions inappropriately, stating words that didn't make sense.  She also felt his face was drooping.    [facility] ED  Upon arrival to [facility], NIHSS 12.  CTH c/w white matter disease; atrophy. CTA + L M2 occulsion with stenosis.  CTP without core or penumbra.  Due to unclear timing of onset of symptoms, not a TNK candidate.  Proceeded with angiography; clot thought to have autolysed - no lytic or thrombectomy performed. Admitted to NSICU following procedure for further monitoring.    Diagnostics:  7.37/56/4.3  Na+ 139 K+ 4.1 Glu 112 Ca2+ 9.2   HgB 16 PLT 45.9  PT/INR 12.8/0.9  Hs Trop 7  WBC 7.1  Tylenol/salicylates/EtOH negative  CXR WNL  EKG normal sinus rhythm    ROS:  Patient reports a history of  diabetes, high blood pressure, liver disease (hepatitis/jaundice), stroke and tobacco use.    PMH:  Past Medical History:   Diagnosis Date   ??? COPD (chronic obstructive pulmonary disease) (CMS-HCC)    ??? Diabetes mellitus (CMS-HCC)    ??? Dysphagia    ??? GERD (gastroesophageal reflux disease)    ??? Hematuria    ??? Hepatitis    ??? Hypertension        Medications:  Medications in  household per significant other:  Aripiprazole 5mg  daily  Aspirin 325 mg daily  Clonidine 0.2mg  BID  Hydralazine 25mg  TID  Hydrochlorothiazide 25 mg daily  Nifedipine (Procardia XL) 90mg  daily  Oxybutunin 10mg  daily  Tamulosin 0.4mg  daily  Vitamin D3 50 mcg daily  PRN Albuterol    Medications previously prescribed and no longer taking:  Atorvastatin  Buproprion  Entecavir  Metformin    His significant other notes he is at times non-compliant with medications.    Allergies:  Allergies   Allergen Reactions   ??? Penicillins Hives       SH:  Social History     Socioeconomic History   ??? Marital status: Legally Separated     Spouse name: Not on file   ??? Number of children: Not on file   ??? Years of education: Not on file   ??? Highest education level: Not on file   Occupational History   ??? Not on file   Tobacco Use   ??? Smoking status: Every Day     Packs/day: 0.25     Years: 5.00     Pack years: 1.25     Types: Cigarettes   ??? Smokeless tobacco: Never   Substance and Sexual Activity   ??? Alcohol use: No   ???  Drug use: No   ??? Sexual activity: Yes     Partners: Female     Birth control/protection: Condom   Other Topics Concern   ??? Caffeine Use Yes   ??? Occupational Exposure Not Asked   ??? Exercise No   ??? Seat Belt No   Social History Narrative   ??? Not on file     Social Determinants of Health     Financial Resource Strain: Not on file   Physical Activity: Not on file   Stress: Not on file   Social Connections: Not on file   Housing Stability: Not on file       FH:  History reviewed. No pertinent family history.    PE:  Vitals:    03/18/21 1515 03/18/21 1829 03/18/21 1830 03/18/21 1900   BP: 189/88 (!) 146/99 (!) 146/99 (!) 172/96   BP Location: Left arm Left arm     Patient Position: Lying Lying     Pulse: 86 84 84 80   Resp: 18 13 14 18    Temp: 97.6 ??F (36.4 ??C) 97.4 ??F (36.3 ??C)     TempSrc: Oral Oral     SpO2: 98% 92% 98% 90%       *GEN:   Older male laying in bed; mildly agitated but redirectable. Thin appearing.  *HEENT:  NCAT.  No conjuctival injection. + nasal discharge.   *C/V:  Hypertensive with normal rate on the monitors. Warm and well perfused.   *CHEST:  Breathing comfortably on room air.   *NEURO: Patient is easily arousable and alert; variable cooperation with exam.     States his name correctly; incorrectly states his age (66).    Follows simple commands (stick out your tongue, give me a thumbs up).    Difficulty with complex commands.    + mild expressive aphasia/dysarthria.     No clear visual field cut; inconsistently BTT on the R.  PERRL. EOM grossly intact.     Reacts to facial sensation.  Mild R nasolabial fold flattening; + weak orbicularis occuli.    Hearing grossly intact. Symmetric palate rise.  Turns head side to side full strength.   Tongue midline.    Appropriate bulk and tone.  Moves all extremities antigravity.  No drift.    5/5 arm flexion/extension, grip bilaterally.      Antigravity in LE BL - isolated mm strength deferred due to angio/groin checks.    +3/symmetric bicep, BR, patellar reflexes.  No clonus.    No truncal titubation. No dysmetria with FNF.    Sensation intact; no extinction.     Gait deferred.       1a  Level of consciousness: 0=alert; keenly responsive   1b. LOC questions:  1=Performs one task correctly   1c. LOC commands: 0=Performs both tasks correctly   2.  Best Gaze: 0=normal   3.  Visual: 0=No visual loss   4. Facial Palsy: 1=Minor paralysis (flattened nasolabial fold, asymmetric on smiling)   5a.  Motor left arm: 0=No drift, limb holds 90 (or 45) degrees for full 10 seconds   5b.  Motor right arm: 0=No drift, limb holds 90 (or 45) degrees for full 10 seconds   6a. motor left leg: 0=No drift, limb holds 90 (or 45) degrees for full 10 seconds   6b  Motor right leg:  0=No drift, limb holds 90 (or 45) degrees for full 10 seconds   7. Limb Ataxia: 0=Absent   8.  Sensory: 0=Normal; no  sensory loss   9. Best Language:  1=Mild to moderate aphasia; some obvious loss of fluency or facility of  comprehension without significant limitation on ideas expressed or form of expression.   10. Dysarthria: 1=Mild to moderate, patient slurs at least some words and at worst, can be understood with some difficulty   11. Extinction and Inattention: 0=No abnormality    Total:   4     Testing:    Chem      Lab 03/18/21  1522   SODIUM 139   POTASSIUM 4.1   CHLORIDE 104   CO2 28   BUN 16   CREATININE 1.37*   GLUCOSE 112*   CALCIUM 9.2        Heme  Recent Labs     03/18/21  1522   WBC 7.1   HGB 16.0   HCT 45.9   PLT 192       Coags         Lab 03/18/21  1522   HEMOGLOBIN 16.0   HEMATOCRIT 45.9   PLATELETS 192   PROTHROMBIN TIME 12.8   INR 0.9       Other      Lab 03/18/21  1522   ALK PHOS 86   ALT 4*   AST 9*   BILIRUBIN TOTAL 1.1   ALBUMIN 4.5   BILIRUBIN DIRECT 0.2   TOTAL PROTEIN 7.4         Lab 03/18/21  1522   ALBUMIN 4.5              Invalid input(s): CO2ART, HBO2PE    Imaging      Impression/Plan:    Neuro:   Acute onset aphasia, R facial droop with L M2 occlusion s/p angio with autolyses  History of L Frontal Infarct s/p TPA without deficit    Monitoring:  - Neurochecks Q15 x1, Q30x1, Q1x4 with groin checks  - Lay flat for 2 hours  - BP less than 180 mmHg  Medications:  - ASA 325mg  now  - Plavix 300mg  load and 75mg  daily  - Discuss resumption of high dose statin - previously prescribed atorvastatin 80mg  daily  Labs:  - TSH/T4, HA1C, lipids  Evaluation  - s/p EKG and CXR in ER  - TEE with bubble ordered  Consults  - PT/OT/SLP    Respiratory:   Stable on room air  History of COPD    - Continuous pulse oximetry  - Hold home Albuterol PRN    Cardiovascular:   Hypertension     - BP less than 180 mmHg  - Continuous monitors  - Cardene gtt at bedside  - Holding home medications while NPO   Clonidine 0.2mg  BID   Hydralazine 25mg  TID   Hydrochlorothiazide 25 mg daily   Nifedipine (Procardia XL) 90mg  daily  - Aspirin/plavix per Neurology plan    Fluids/Electrolytes/Renal:  Mildly increased creatinine  S/p Foley during  procedure - difficult with balloon deflation    Intake/Output Summary (Last 24 hours) at 03/18/2021 2215  Last data filed at 03/18/2021 2000  Gross per 24 hour   Intake --   Output 500 ml   Net -500 ml     - Monitor I's and O's   - Renal, Mg2+ Qdaily  - Holding home medications   Oxybutunin 10mg  daily   Tamulosin 0.4mg  daily    GI/Nutrition:   NPO  History of GERD  History of hepatitis    - NPO  - Holding home Vitamin  D3 daily  - Consider bowel regimen PRN    Infectious Disease  Afebrile, no leukocytosis    UA negative nitrites, LE.    - Monitor for fever    Hematologic:  Normal HgB, PLT, PT/INR    - CBC AM  - Platelet function P2Y12 pending  - PT/INR AM    Endocrine:   History of T2DM    - Blood glucose less than 180 mg/dL  - ZO1W pending  - R6E BG checks while NPO  - LDSSI  - Previously prescribed metformin 500mg  BID - no longer taking (?)    Sedation/Pain Management/Psychiatric:   Agitation  Pain  S/p Haldol     - Holding home medications   Aripiprazole 5mg  daily   Oxycodone 10mg  TID-QID    Lines/Tubes:  PIV x2    Injury/Disease Specific Needs:  Last Lower Ext. Duplex: Order HD 2-3  DVT Prophylaxis: Contraindicated  GI Prophylaxis: Deferred    Disposition: Transfer to floor pending completion of groin checks/BP control    Anisha Starliper  Neurocritical Care  03/18/2021  8:46 PM

## 2021-03-18 NOTE — Unmapped (Signed)
Pre-Procedure Patient History / Assessment    [name]  MRN: [id number]  DOB: 14-Jul-1955    66 yo M with COPD, DM, prior stroke without deficits, who presents with aphasia NIHSS=12. Dropped off by significant other with report of aphasia starting 3 hours ago. We are unable to reach her to confirm timeline. Family says he has been acting funny and confused for a few days but not clearly aphasic. Imaging with left inferior M2 occlusion. The CTP does not show an ischemic penumbra or infarcted core but his SBP is 194 at time of imaging.     Discussed with stroke team and NOK sister. No lytic with unclear LSN however he has a disabling clinical syndrome which matches the M2 occlusion and no ischemic core. Will proceed with EVT.    NIHSS performed by me at 16:10  1a  Level of consciousness: 0=alert; keenly responsive   1b. LOC questions:  2=Performs neither task correctly   1c. LOC commands: 2=Performs neither task correctly   2.  Best Gaze: 0=normal   3.  Visual: 0=No visual loss   4. Facial Palsy: 1=Minor paralysis (flattened nasolabial fold, asymmetric on smiling)   5a.  Motor left arm: 0=No drift, limb holds 90 (or 45) degrees for full 10 seconds   5b.  Motor right arm: 1=Drift, limb holds 90 (or 45) degrees but drifts down before full 10 seconds: does not hit bed   6a. motor left leg: 2=Some effort against gravity, limb cannot get to or maintain (if cured) 90 (or 45) degrees, drifts down to bed, but has some effort against gravity   6b  Motor right leg:  2=Some effort against gravity, limb cannot get to or maintain (if cured) 90 (or 45) degrees, drifts down to bed, but has some effort against gravity   7. Limb Ataxia: 0=Absent   8.  Sensory: 0=Normal; no sensory loss   9. Best Language:  2=Severe aphasia   10. Dysarthria: 0=Normal   11. Extinction and Inattention: 0=No abnormality    Total:   12         Allergies   Allergen Reactions   ??? Penicillins Hives       ASA Physical Status Classification System:  ASA  4    NPO since: unknown    Review of Nursing History/Assessment:  Yes    Consent: I discussed risks, benefits and alternatives of procedure with patient/patients family and they would like to proceed.     H&P Reviewed:  Yes    Medical History:  Past Medical History:   Diagnosis Date   ??? COPD (chronic obstructive pulmonary disease) (CMS-HCC)    ??? Diabetes mellitus (CMS-HCC)    ??? Dysphagia    ??? GERD (gastroesophageal reflux disease)    ??? Hematuria    ??? Hepatitis    ??? Hypertension        Surgical History:  Past Surgical History:   Procedure Laterality Date   ??? ESOPHAGOGASTRODUODENOSCOPY N/A 11/19/2013    Procedure: ESOPHAGOGASTRODUODENOSCOPY WITH MAC;  Surgeon: Charlane Ferretti, MD;  Location: [facility] ENDOSCOPY;  Service: Gastroenterology;  Laterality: N/A;   ??? FOOT SURGERY Left    ??? HERNIA REPAIR      about 5 yrs ago       Prior Anesthesia History:          [id number]        Relevant diagnostic tests and images reviewed:   Yes    Medication Reconciliation Reviewed:  Yes  Sedation plan (type/medications):    Plan: Cerebral angiography with thrombectomy     GETA

## 2021-03-18 NOTE — Unmapped (Signed)
The patient was seen and examined.  The case was discussed with the resident physician who saw the patient primarily.  I agree with the plan.    My assessment reveals a gentleman who is alert and oriented x1.  He answers most questions for me, regardless of the question, with I am all right.  His motor exam is inconsistent but he does not seem to have a definite focal motor deficit.  No facial droop.  Last known well time is difficult to ascertain as no one is here with him, but based on a preliminary telephone conversation with the family member it sounds like it was days ago.    I have reviewed the EKG and agree with the resident's interpretation.    Due to the immediate potential for life-threatening deterioration due to acute ischemic stroke, I personally spent 32 minutes performing critical care on this patient.  This time specifically excludes time spent performing procedures.  I have reviewed the resident's patient care and agree with the diagnosis and plan of care for acute ischemic stroke.   This time was spent speaking with EMS, speaking with the stroke team, speaking with neuro interventional, performing history and physical examination, reviewing a great deal of complex clinical data, treating the patient, reassessing the patient, updating the patient/family, speaking with the admitting service, and documenting the patient's care.      This note was dictated using voice recognition software which occasionally leads to inadvertent typographical errors.

## 2021-03-18 NOTE — Unmapped (Addendum)
[facility]  Neurology Department   Neurology Transfer Note  Subjective:   Chief complaint / Reason for Follow-Up:  [name] is a 66 y.o. male on hospital day 0.  The principal reason for today's follow up visit is left M2 occlusion.    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 Unclear LKN since has been acting off baseline (confused, not aphasic) for the past 1-2 days.     Initially in ED NIHSS 12 (severe aphasia with no clear focal deficits, limited by difficulty understanding). CTH without hemorrhage. CTA with M2 occlusion, CTP without core or penumbra. Not candidate for TPA given unknown LKN. Proceeded to EVT given degree of disabling symptoms although no clear thrombus identified or removed during procedure. Started on DAPT, given aspirin & clopidogrel load. Admitted to NSICU post-procedure for groin checks prior to transfer to Neurology service. Patient agitated prior to transfer, given 1x Haldol 2mg .     Medications:   is allergic to penicillins.  Scheduled Meds:  ??? [START ON 03/19/2021] clopidogreL  75 mg Oral Daily 0900   ??? [START ON 03/19/2021] insulin regular  0-5 Units Subcutaneous Q6H Scheduled   ??? nitroGLYCERIN 50 mg in D5W 250 mL         Continuous Infusions:  ??? niCARdipine Stopped (03/18/21 2224)     Home medications:  Medications in household per significant other:  Aripiprazole 5mg  daily  Aspirin 325 mg daily  Clonidine 0.2mg  BID  Hydralazine 25mg  TID  Hydrochlorothiazide 25 mg daily  Nifedipine (Procardia XL) 90mg  daily  Oxybutunin 10mg  daily  Tamulosin 0.4mg  daily  Vitamin D3 50 mcg daily  PRN Albuterol    Physical Examination:   Vital Signs:  BP (!) 175/109    Pulse 73    Temp 97.4 ??F (36.3 ??C) (Oral)    Resp 12    Ht 6' (1.829 m)    Wt 180 lb (81.6 kg)     SpO2 99%    BMI 24.41 kg/m??     Intake/Output Summary (Last 24 hours) at 03/18/2021 2313  Last data filed at 03/18/2021 2000  Gross per 24 hour   Intake --   Output 500 ml   Net -500 ml       General Appearance: Resting, in no acute distress  Pulm: Respirations comfortable on room air  CV: Warm, well perfused  GI: Nondistended  Extr: No clubbing, cyanosis or edema    Mental Status:   Appearance: in no acute distress, drowsy but arousable  Language/Speech: mild fluent aphasia with frequent paraphasic errors, mild dysarthria. Perseveration present. Unable to repeat. Follows 1 step commands intermittently, unable to follow complex commands.  Atten/concentration: attends to exam intermittently, improves with coaching  Orientation: difficult to assess 2/2 paraphasic errors and perseveration ([facility])    Cranial Nerve:  CN II: visual acuity, assessed by finger counting in tact   CN III/IV/VI: extraocular movements intact   CN V: facial sensation normal in VI, VII, VII,  Assessed by LT  CN VII: facial motion in tact and symmetric  CN VIII: hearing in tact to conversation  CN IX/X: palate elevation symmetric    CN XI: trapezius 5/5 symmetric strength  CN XII: Tongue protrudes midline    Motor:  R L   R L   Deltoid 5 5  Hip Flex 5 5   Biceps 5 5  Knee Ext 5 5   Triceps 5 5  Knee Flex 5 5   Wrist Ext 5 5  Plantar Flex 5 5   Wrist Flex 5 5  Ankle Dorsiflex 5 5   Grip 5 5         Sensory:light touch intact bilaterally upper and lower extremities  Coord: no marked dysmetria or tremor, unable to fully assess, patient unable to follow commands (ie FTN testing or heel to shin)  Gait: deferred in ICU setting    Deep Tendon Reflexes:    R L   Biceps 3 3   Triceps 3 3   Brachioradialis 3 3   Patellar 3 3   Achilles 3 3     NIHSS as of 03/18/21 at 2333  1a  Level of consciousness: 0=alert; keenly responsive   1b. LOC questions:  1=Performs one task correctly   1c. LOC commands: 0=Performs both tasks correctly   2.   Best Gaze: 0=normal   3.  Visual: 0=No visual loss   4. Facial Palsy: 0=Normal symmetric movement   5a.  Motor left arm: 0=No drift, limb holds 90 (or 45) degrees for full 10 seconds   5b.  Motor right arm: 0=No drift, limb holds 90 (or 45) degrees for full 10 seconds   6a. motor left leg: 0=No drift, limb holds 90 (or 45) degrees for full 10 seconds   6b  Motor right leg:  0=No drift, limb holds 90 (or 45) degrees for full 10 seconds   7. Limb Ataxia: 0=Does not understand   8.  Sensory: 0=Normal; no sensory loss   9. Best Language:  1=Mild to moderate aphasia; some obvious loss of fluency or facility of comprehension without significant limitation on ideas expressed or form of expression.   10. Dysarthria: 1=Mild to moderate, patient slurs at least some words and at worst, can be understood with some difficulty   11. Extinction and Inattention: 0=No abnormality    Total:   3         Laboratory Data:     Recent Labs     03/18/21  1522   NA 139   K 4.1    Recent Labs     03/18/21  1522   CL 104   CO2 28    Recent Labs     03/18/21  1522   BUN 16   CREATININE 1.37*      Recent Labs     03/18/21  1522   CALCIUM 9.2    No results for input(s): MG in the last 72 hours. No results for input(s): PHOS in the last 72 hours.     Recent Labs     03/18/21  1522   GLUCOSE 112*       Recent Labs     03/18/21  1522   WBC 7.1    Recent Labs     03/18/21  1522   HGB 16.0    Recent Labs     03/18/21  1522   PLT 192        Recent Labs     03/18/21  1522   AST 9*   ALT 4*    Recent Labs     03/18/21  1522   ALKPHOS 86   BILITOT 1.1    Recent Labs     03/18/21  1522   ALBUMIN 4.5        Recent Labs     03/18/21  1522   PROTIME 12.8    No results for input(s): PTT in the last 72 hours. Recent Labs     03/18/21  1522   INR 0.9        Lab Results   Component Value Date    CHOLTOT 188 07/29/2015    TRIG 51 07/29/2015    HDL 55 (L) 07/29/2015    LDL 123 07/29/2015     Lab Results   Component Value Date    HGBA1C 5.7 07/28/2015     Imaging  Data:     X-ray Portable Chest   Final Result   IMPRESSION:    No acute cardiopulmonary abnormality.      Approved by Vira Blanco, MD on 03/18/2021 4:41 PM EST      I have personally reviewed the images and I agree with this report.      Report Verified by: Baird Cancer, MD at 03/18/2021 4:46 PM EST      CT Cerebral Perfusion With IV contrast   Final Result   IMPRESSION: No area of hypoperfusion or ischemia.      Report Verified by: Cleatis Polka, MD at 03/18/2021 4:13 PM EST      CT Angio Head W and or WO   Final Result   IMPRESSION:      Neck   1.  Mild atherosclerotic disease with no significant carotid or vertebral artery stenosis.   2.  No dissection or pseudoaneurysm.      Head   1.  Short segment occlusion of one of the left M2 branches in the sylvian fissure. Multifocal stenosis of this M2 branch distally.   2.  Moderate stenosis of the right M2 posterior branch.      Report Verified by: Cleatis Polka, MD at 03/18/2021 4:37 PM EST      Code Stroke-CT Head WO contrast   Final Result   IMPRESSION:      1.  Diffuse atrophy.   2.  Nonspecific white matter disease likely chronic microvascular ischemia.   3.  No intracranial hemorrhage or mass effect.         Report Verified by: Cleatis Polka, MD at 03/18/2021 3:28 PM EST      CT Angio Neck W and or WO   Final Result   IMPRESSION:      Neck   1.  Mild atherosclerotic disease with no significant carotid or vertebral artery stenosis.   2.  No dissection or pseudoaneurysm.      Head   1.  Short segment occlusion of one of the left M2 branches in the sylvian fissure. Multifocal stenosis of this M2 branch distally.   2.  Moderate stenosis of the right M2 posterior branch.      Report Verified by: Cleatis Polka, MD at 03/18/2021 4:37 PM EST          Assessment & Plan:   Acute onset aphasia  Symptoms concerning for TIA given interval improvement of symptoms and no clear thrombosis identified during attempted intervention. Unclear whether patient has been taking outpatient medications, has  history of noncompliance and unable to fully assess 2/2 aphasia.   - Continue home ASA 81mg  daily, atorvastatin 80mg  qHS   - Begin clopidogrel 75mg  daily (s/p 300mg  load 2/19)   - Risk stratification labs: lipid profile, A1C, TSH, UDS  - MRI brain w/o   -  Frequent neurochecks per unit routine; repeat head CT if any changes in exam  - Groin checks q4hrs post-cath  - TTE with bubble   - BP goal:   - Hold all home BP meds,  and allow for permissive HTN to SBP 180  - Can consider nicardipine gtt if BP remains uncontrolled  - Can consider resuming home BP meds in 24-48 hrs if pt stable  - Stroke rehab: bedside dysphagia screen by nursing; PT/OT/SLP  - DVT prophylaxis with SQH TID   - Telemetry and pOx     COPD  - Continuous pulse oximetry  - Per Pulmonology note, was supposed to be on Spiriva although no fills per retail rx dispense history   - Hold home Albuterol PRN    BPH  - Hold tamsulosin 0.4mg  daily    T2DM  - HA1C pending  - Maintain BG<180, SSI if needed  - Q6H BG checks while NPO    Psych/Pain  - S/p Haldol   - Holding home medications              Aripiprazole 5mg  daily    Diet:  NPO except meds (passed bedside eval) until speech assessment  Code Status: Full Code  Disposition: Transfer to floor    Sula Soda, MD  Department of Neurology   Pager# [phone]  11:13 PM, 03/18/2021    Additional Fellow Details:     66 yo m w/ pmhx of prior L MCA stroke (L M3 occlusion s/p tPA in 2017), HTN, DMII, R parotid mass awaiting excision/bx who presented w/ acute aphasia and had a CTA suggestive of a L M2 occlusion though      Interval events:  Now mostly back to baseline, transferred from NSICU     Brief exam:  Mild word finding difficulties but overall able to participate in exam. 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 If MRI demonstrates ischemia in this region then will presume stroke which is likely related to focal stenosis of the L M2 but which also could be cardioembolic. If MRI is not present then there could be a high suspicion for seizure in setting of old encephalomalacia   - MRI w/o pending  - TTE  - Routine EEG  - Received 300mg  plavix load on admission. Will continue DAP w/ asa and plavix for now unless no stroke is present  - If stroke present will get outpatient cardiac event monitor  - PT/OT recommending IPR    #HTN  Severe and requiring multiple medications outpatient. Will slowly reintroduce while here  - Restarted home HCTZ    Dispo: Floor, to IPR pending stroke/seizure workup    Antionette Char, DO  PGY 5, Vascular Neurology Fellow

## 2021-03-18 NOTE — Unmapped (Signed)
[facility] ED Note    Reason for Visit: Altered Mental Status    Patient History     HPI:  [name] is a 66 y.o. male with PMHx of prior CVA without any known residual deficits, DM, HTN, COPD who presented to the emergency department with AMS. Pt brought in by girlfriend who reported that patient had the onset of confusion/aphasia within the last few hours. Unknown exact LKW. Multiple staff attempted to locate this individual to obtain further history/confirm LKW but was no longer in lobby and not answering phone number in chart. Called patient's sister in chart who noted pt had been acting confused and not talking right for the past few days. Per chart review does not appear to be anticoagulated or have any history of ICH.     Pt responds with nonsensical yes/no answers to all questions, further history limited at this time.     Last known normal: unknown  Anticoagulants: none per chart review   Prior history of bleeds: none per chart review    Past Medical History:   Diagnosis Date   ??? COPD (chronic obstructive pulmonary disease) (CMS-HCC)    ??? Diabetes mellitus (CMS-HCC)    ??? Dysphagia    ??? GERD (gastroesophageal reflux disease)    ??? Hematuria    ??? Hepatitis    ??? Hypertension        Past Surgical History:   Procedure Laterality Date   ??? ESOPHAGOGASTRODUODENOSCOPY N/A 11/19/2013    Procedure: ESOPHAGOGASTRODUODENOSCOPY WITH MAC;  Surgeon: Charlane Ferretti, MD;  Location: [facility] ENDOSCOPY;  Service: Gastroenterology;  Laterality: N/A;   ??? FOOT SURGERY Left    ??? HERNIA REPAIR      about 5 yrs ago       MITSUO BUDNICK  reports that he has been smoking cigarettes. He has a 1.25 pack-year smoking history. He has never used smokeless tobacco. He reports that he does not drink alcohol and does not use drugs.    Current Discharge Medication List      CONTINUE these medications which have NOT CHANGED    Details   ALBUTEROL INHL Inhale into the lungs.      amLODIPine (NORVASC) 10 MG tablet Take 10 mg by mouth daily       aspirin 81 MG EC tablet Take 1 tablet (81 mg total) by mouth daily.  Qty: 30 tablet, Refills: 0      atorvastatin (LIPITOR) 80 MG tablet Take 1 tablet (80 mg total) by mouth at bedtime.  Qty: 30 tablet, Refills: 0      benzonatate (TESSALON PERLES) 100 MG capsule Take 1 capsule (100 mg total) by mouth 3 times a day as needed for Cough.  Qty: 20 capsule, Refills: 0      blood sugar diagnostic Strp Test daily      blood-glucose meter Misc by Does not apply route.      buPROPion SR (WELLBUTRIN SR) 100 MG tablet Take 100 mg by mouth 2 times daily Ran out of prescription      entecavir (BARACLUDE) 0.5 MG tablet Take 1 tablet (0.5 mg total) by mouth daily.  Qty: 30 tablet, Refills: 2    Associated Diagnoses: Hepatitis B infection without delta agent without hepatic coma, unspecified chronicity      FLUoxetine (PROZAC) 20 MG capsule       hydroCHLOROthiazide (MICROZIDE) 12.5 mg capsule Take 1 capsule (12.5 mg total) by mouth daily.  Qty: 30 capsule, Refills: 3  ibuprofen (ADVIL,MOTRIN) 800 MG tablet Take 0.5 tablets (400 mg total) by mouth every 6 hours as needed for up to 16 doses.  Qty: 16 tablet, Refills: 0      ketoconazole (NIZORAL) 2 % cream       loratadine 10 mg Cap Take 1 tablet by mouth daily      losartan (COZAAR) 100 MG tablet Take 1 tablet (100 mg total) by mouth daily. Take 100 mg by mouth daily  Qty: 30 tablet, Refills: 0      metFORMIN (GLUCOPHAGE) 500 MG tablet Take 1 tablet (500 mg total) by mouth 2 times a day with meals.      ondansetron (ZOFRAN ODT) 4 MG disintegrating tablet Take 1 tablet (4 mg total) by mouth every 6 hours as needed for Nausea.  Qty: 16 tablet, Refills: 0      orphenadrine (NORFLEX) 100 mg tablet Take 1 tablet (100 mg total) by mouth 2 times a day.  Qty: 10 tablet, Refills: 0      oxybutynin (DITROPAN-XL) 10 MG 24 hr tablet Take 1 tablet (10 mg total) by mouth daily.  Qty: 30 tablet, Refills: 3    Associated Diagnoses: Lower urinary tract symptoms (LUTS)      oxyCODONE  (ROXICODONE) 5 MG immediate release tablet Take 1 tablet (5 mg total) by mouth every 4 hours as needed for Pain.  Qty: 30 tablet, Refills: 0      oxyCODONE-acetaminophen (PERCOCET) 10-325 mg per tablet 1 tablet every 4 hours as needed.      tamsulosin (FLOMAX) 0.4 mg Cap Take 1 capsule (0.4 mg total) by mouth at bedtime. Take 0.4 mg by mouth daily  Qty: 30 capsule, Refills: 3    Associated Diagnoses: Lower urinary tract symptoms (LUTS)      tiotropium (SPIRIVA) 18 mcg Inhale 1 capsule (18 mcg total) into the lungs daily.  Qty: 30 capsule, Refills: 11      traZODone (DESYREL) 100 MG tablet              Allergies:   Allergies as of 03/18/2021 - Fully Reviewed 03/18/2021   Allergen Reaction Noted   ??? Penicillins Hives 04/22/2013       Review of Systems     ROS: Unable to assess 2/2 AMS    Physical Exam     ED Triage Vitals [03/18/21 1458]   Vital Signs Group      Temp       Temp Source Oral      Heart Rate 97      Heart Rate Source Monitor;Automatic      Resp 20      SpO2 98 %      BP (!) 187/122      MAP (mmHg) 142      BP Location Right arm      BP Method Automatic      Patient Position Sitting   SpO2 98 %   O2 Device None (Room air)       General:  Awake, alert male in no acute distress.   HEENT:  Normocephalic, atraumatic, PERRL, oropharynx benign  Neck:  Supple, full ROM  Pulmonary:   Clear to auscultation bilaterally, good air movement, no wheezes  Cardiac:  Regular rate and rhythm, no M/R/G  Abdomen:  Soft, non-tender, non-distended, no rebounding or guarding.  Musculoskeletal:  2+ pulses, no edema or clubbing  Skin:  No concerning rashes or lesions, no cyanosis  Neuro:   A&Ox1.  CN 2-12 intact.  Can state his name but otherwise answers yes/no to all questions even when this answer does not make sense. Able to follow simple commands intermittently. Pt able to hold all 4 extremities antigravity but does not consistently cooperate with holding above bed, difficult to assess for drift. Sensation appears to be  intact to light touch throughout. Unable to cooperate with FTN. Gait deferred.       NIH Stroke Scale                                    03/19/2021                                                 10:17 PM    1a  Level of consciousness: 0=alert; keenly responsive   1b. LOC questions:  2=Performs neither task correctly   1c. LOC commands: 1=Performs one task correctly   2.  Best Gaze: 0=normal   3.  Visual: 0=No visual loss   4. Facial Palsy: 0=Normal symmetric movement   5a.  Motor left arm: 0=No drift, limb holds 90 (or 45) degrees for full 10 seconds   5b.  Motor right arm: 0=No drift, limb holds 90 (or 45) degrees for full 10 seconds   6a. motor left leg: 0=No drift, limb holds 90 (or 45) degrees for full 10 seconds   6b  Motor right leg:  0=No drift, limb holds 90 (or 45) degrees for full 10 seconds   7. Limb Ataxia: 0=Absent   8.  Sensory: 0=Normal; no sensory loss   9. Best Language:  2=Severe aphasia   10. Dysarthria: 0=Normal   11. Extinction and Inattention: 0=No abnormality         Total:   5       Diagnostic Studies     Labs:  Labs Reviewed   BASIC METABOLIC PANEL - Abnormal; Notable for the following components:       Result Value    Creatinine 1.37 (*)     Glucose 112 (*)     All other components within normal limits   HEPATIC FUNCTION PANEL - Abnormal; Notable for the following components:    AST 9 (*)     ALT 4 (*)     All other components within normal limits   URINALYSIS-MACROSCOPIC W/REFLEX TO MICROSCOPIC - Abnormal; Notable for the following components:    Blood, UA Small (*)     All other components within normal limits   ACETAMINOPHEN LEVEL - Abnormal; Notable for the following components:    Acetaminophen Level <10 (*)     All other components within normal limits   SALICYLATE LEVEL - Abnormal; Notable for the following components:    Salicylate Lvl <3 (*)     All other components within normal limits   VENOUS BLOOD GAS, LINE/SYRINGE - Abnormal; Notable for the following components:    PCO2-Line  Draw 56 (*)     PO2-Line Draw 20 (*)     HCO3-Line Draw 32 (*)     CO2 Content-Line Draw 34 (*)     Base Excess-Line Draw 4.3 (*)     %HBO2-Line Draw 30.5 (*)     Carboxyhgb-Line Draw 5.4 (*)     Reduced Hemoglobin-Line Draw 63.6 (*)  All other components within normal limits    Narrative:     Specimen is beyond 15 minutes from time of collection. Results may be compromised. Review results critically.   POC GLU MONITORING DEVICE - Abnormal; Notable for the following components:    POC Glucose Monitoring Device 129 (*)     All other components within normal limits   CBC   PROTIME-INR   HIGH SENSITIVITY TROPONIN   ETHANOL, SERUM   AMMONIA   URINALYSIS, MICROSCOPIC   LIPID PANEL   HEMOGLOBIN A1C   TSH   T4, FREE   PLATELET FUNCTION P2Y12   BASIC METABOLIC PANEL   CALCIUM   CBC   DIFFERENTIAL   MAGNESIUM   PHOSPHORUS   PROTIME-INR   URINE DRUG SCREEN, COMPREHENSIVE PANEL SCREEN/CONFIRMATION   POC GLU MONITORING DEVICE   POC GLU MONITORING DEVICE   ED ECG 12-LEAD (MUSE)    Narrative:     Ventricular Rate:  87  BPM  Atrial Rate:  87  BPM  P-R Interval:  140  ms  QRS Duration:  88  ms  QT:  376  ms  QTc:  452  ms  P Axis:  82  degrees  R Axis:  76  degrees  T Axis:  110  degrees  Diagnosis Line:  INTERPRETATION NOT AVAILABLE--ECG READ IN ER ^  ^ Confirmed by PHYSICIAN, ER ([id number]), editor Tawni Pummel ([id number]) on 03/19/2021 8:36:06 AM       Radiology:    Please see electronic medical record for any tests performed in the ED    EKG:  NSR, rate 87. Normal axis. Normal intervals. Biphasic T waves V4-V6 unchanged from prior. No acute ST change. Non-ischemic EKG.     Emergency Department Procedures     ED Course and Medical-Decision-Making   Burl HARSHA YUSKO is a 66 y.o. male who presented to the emergency department with a chief complaint of Altered Mental Status   as described in the history of present illness. Nursing notes were reviewed, applicable prior records were reviewed, and a full history and physical examination was  performed by me in collaboration with the attending physician Dr. Mauricio Po, MD.     The patient presents with AMS/aphasia, concerning for acute ischemic stroke. Unfortunately we had significant difficulty confirming LKW as girlfriend who was reportedly with patient at symptom onset and brought pt to ED could not be located or reached by phone. We were able to confirm with patient's sister in the chart that pt has been acting confused and not talking as much as usual for a few days.      Blood glucose WNL. A STAT head CT was obtained, which did not demonstrate intracerebral blood. CXR did not show an acutely wide mediastinum and the pt's EKG showed NSR, no acute ischemic change from prior. Given unclear LKW, thrombolytics were not administered. CTA notable for short segment occlusion L M2. As LKW unclear, CTP was obtained and demonstrated no areas of hypoperfusion/ischemia. Discussed case with [facility] stroke team and neuro IR. They felt CTP may not be reliable given significant hypertension at time of assessment. Neuro IR team evaluated pt at bedside and elicited focal R sided weakness (not present on initial exam), in combination with his aphasia they felt his symptoms were explained by his LVO and opted to take for endovascular intervention. Pt to be admitted to NSICU thereafter.     Lab studies unremarkable for any alternative metabolic, infectious, or toxicologic etiologies to explain his  symptoms at this point.     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  Labs: ordered. Decision-making details documented in ED Course.  Radiology: ordered and independent interpretation performed. Decision-making details documented in ED Course.  ECG/medicine tests:  ordered.      Risk  Prescription drug management.            Summary of Treatment in ED:  Medications   nitroGLYCERIN 50 mg in D5W 250 mL 50 mg/250 mL (200 mcg/mL) infusion (  Not Given 03/18/21 2042)   glucose chewable tablet 12 g (has no administration in time range)   dextrose 10%-water (D10W) IV soln (has no administration in time range)     Or   dextrose 10%-water (D10W) IV soln (has no administration in time range)   aspirin chewable tablet 81 mg (81 mg Oral Given 03/19/21 1024)   atorvastatin (LIPITOR) tablet 80 mg (80 mg Oral Given 03/19/21 2102)   heparin (porcine) injection 5,000 Units (5,000 Units Subcutaneous Given 03/19/21 2103)   hydroCHLOROthiazide (HYDRODIURIL) tablet 25 mg (25 mg Oral Given 03/19/21 1024)   nicotine (NICODERM CQ) 7 mg/24 hr 1 patch (has no administration in time range)   clopidogreL (PLAVIX) tablet 75 mg (has no administration in time range)   OMNIPAQUE (iohexol) 350 mg iodine/mL 100 mL (100 mLs Intravenous Given 03/18/21 1508)   OMNIPAQUE (iohexol) 350 mg iodine/mL 100 mL (80 mLs Intravenous Given 03/18/21 1551)   haloperidol lactate (HALDOL) injection 2 mg (2 mg Intravenous Given 03/18/21 1914)   clopidogreL (PLAVIX) tablet 300 mg (300 mg Oral Given 03/18/21 1945)   aspirin tablet 325 mg (325 mg Oral Given 03/18/21 1945)       Clinical Impression:   1. Left M2 LVO  2. Aphasia      Barnetta Hammersmith, PGY-3  [facility]      Barnetta Hammersmith, MD  Resident  03/19/21 [phone]

## 2021-03-18 NOTE — Unmapped (Signed)
Patient foley catheter was not fully in place before procedure. Due to urgent nature of stroke Dr. Katrinka BlazingSmith decided to proceed with the procedure and to let RN fix foley afterward. After procedure RN went to deflate the balloon. Balloon did not deflate. Urology resident called, recommended to cut foley catheter at port and manually deflate balloon. Balloon was able to be deflated and catheter removed with no further incident.

## 2021-03-18 NOTE — Unmapped (Signed)
Problem: Neurological Deficit  Goal: Neurological status is stable or improving  Description: Monitor and assess patient's level of consciousness, motor function, sensory function, and level of assistance needed for ADLs.  Monitor and report changes from baseline. Collaborate with interdisciplinary team to initiate plan and implement interventions as ordered.   Outcome: Progressing     Problem: Activity Intolerance/Impaired Mobility  Goal: Mobility/activity is maintained at optimum level for patient  Description: Assess and monitor patient  barriers to mobility and need for assistive/adaptive devices. Assess patient's emotional response to limitations. Collaborate with interdisciplinary team and initiate plans and interventions as ordered.  Outcome: Progressing     Problem: Communication Impairment  Goal: Ability to express needs and understand communication  Description: Assess patient's communication skills and ability to understand information.  Patient will demonstrate use of effective communication techniques, alternative methods of communication and understanding even if not able to speak.   Outcome: Progressing     Problem: Potential for Aspiration  Goal: Non-ventilated patient's risk of aspiration is minimized  Description: Assess and monitor vital signs, respiratory status, and labs (WBC).  Monitor for signs of aspiration (tachypnea, cough, rales, wheezing, cyanosis, fever).  Outcome: Progressing

## 2021-03-18 NOTE — Unmapped (Signed)
MD at bedside attempting to place NG to receive plavix load and aspirin dose. Unable to place NG due to patient agitation. MD requested bedside swallow to ensure patient receives necessary medications. Pt able to swallow water without s/s of aspiration. Per MD, pt to receive medications by mouth since unable to receive NG tube. This RN crushed pills in applesauce and attempted to give patient necessary medications. Pt refused medications, sat up in bed and refused to lay down. Patient became agitated and is refusing all interventions and treatment at this time. Per night team, gave 2mg  haldol and will attempt NG again to ensure that patient received necessary medications.    Aundra MilletLauren Daron Breeding, RN

## 2021-03-18 NOTE — Unmapped (Signed)
Called for recs regarding stroke symptoms of unknown onset.    Pt was dropped off by significant other for word finding difficulty, initially thought three hours since LKW but later determined had been acting off of baseline (unclear exact nature of symptoms) for 1-2 days. Given this timeline he was not a candidate for fibrinolysis.    Noncontrasted CT head showed no established infarct or hemorrhage. CTA showed a left M2 lesion. CTP was then done, but showed no core or penumbra.    NIHSS was 12 (likely inflated due to severe aphasia and impaired comprehension of exam), but the aphasia itself was felt to be debilitating.     Discussed this radio-clinical discrepancy with neuro IR and despite CTP findings we opted to proceed with endovascular treatment given the M2 lesion corresponding to his symptoms.    Pt will be admitted to NSICU following his procedure.      Volney American, MD  [facility] Stroke Team    Telemedicine time 40 minutes

## 2021-03-19 ENCOUNTER — Inpatient Hospital Stay: Admit: 2021-03-20 | Payer: Medicare (Managed Care)

## 2021-03-19 ENCOUNTER — Inpatient Hospital Stay: Payer: Medicare (Managed Care)

## 2021-03-19 DIAGNOSIS — I63512 Cerebral infarction due to unspecified occlusion or stenosis of left middle cerebral artery: Principal | ICD-10-CM

## 2021-03-19 LAB — POC GLU MONITORING DEVICE
POC Glucose Monitoring Device: 89 mg/dL (ref 70–100)
POC Glucose Monitoring Device: 98 mg/dL (ref 70–100)

## 2021-03-19 MED ORDER — hydroCHLOROthiazide (HYDRODIURIL) tablet 25 mg
25 | Freq: Every day | ORAL | Status: AC
Start: 2021-03-19 — End: 2021-03-21
  Administered 2021-03-19 – 2021-03-20 (×2): 25 mg via ORAL

## 2021-03-19 MED ORDER — clopidogreL (PLAVIX) tablet 75 mg
75 | Freq: Every day | ORAL | Status: AC
Start: 2021-03-19 — End: 2021-03-21
  Administered 2021-03-20: 13:00:00 75 mg via ORAL

## 2021-03-19 MED ORDER — insulin lispro (humaLOG/ADMELOG) injection 0-5 Units
100 | Freq: Three times a day (TID) | SUBCUTANEOUS | Status: AC
Start: 2021-03-19 — End: 2021-03-19

## 2021-03-19 MED ORDER — nicotine (NICODERM CQ) 7 mg/24 hr 1 patch
7 | Freq: Every day | TRANSDERMAL
Start: 2021-03-19 — End: 2021-03-21
  Administered 2021-03-20: 13:00:00 1 via TRANSDERMAL

## 2021-03-19 MED FILL — HEPARIN (PORCINE) 5,000 UNIT/ML INJECTION SOLUTION: 5000 5,000 unit/mL | INTRAMUSCULAR | Qty: 1

## 2021-03-19 MED FILL — NICOTINE 7 MG/24 HR DAILY TRANSDERMAL PATCH: 7 7 mg/24 hr | TRANSDERMAL | Qty: 1

## 2021-03-19 MED FILL — HYDROCHLOROTHIAZIDE 25 MG TABLET: 25 25 MG | ORAL | Qty: 1

## 2021-03-19 MED FILL — ASPIRIN 81 MG CHEWABLE TABLET: 81 81 MG | ORAL | Qty: 1

## 2021-03-19 MED FILL — INSULIN LISPRO 100 UNIT/ML SUB-Q (NO 2ND CHECK): 100 100 unit/mL | SUBCUTANEOUS | Qty: 3

## 2021-03-19 MED FILL — HUMULIN R REGULAR U-100 INSULIN 100 UNIT/ML INJECTION SOLUTION: 100 100 unit/mL | INTRAMUSCULAR | Qty: 3

## 2021-03-19 NOTE — Unmapped (Signed)
Problem: Patient will remain free of injury d/t fall  Goal: High Fall Risk Precautions  Outcome: Progressing  Goal: Additional High Fall Risk Precautions (consider the following)  Outcome: Progressing

## 2021-03-19 NOTE — Unmapped (Signed)
[facility]  Care Management/Social Work Assessment      Patient Information     Patient Name: [name]  MRN: [id number]  Hospital Day: 1  Inpatient/Observation: Inpatient  Admit Date: 03/18/2021  Admission Diagnosis: Cerebrovascular accident (CVA) due to occlusion of left middle cerebral artery (CMS-HCC) [I63.512]  Acute ischemic left MCA stroke (CMS-HCC) [V40.981]  Attending provider: Burnett Harry, MD    PCP: Vevelyn Francois, DO  Home Pharmacy:              [facility] [address] [place] [facility] [address]  [address] [place] [address]  Phone: [phone]               Issues related to obtaining medications: N/A    Payor Information   Medical Insurance Coverage:  Payor: UNITED HEALTHCARE MANAGED MEDICARE / Plan: [facility] MEDICARE HMO COMPLETE / Product Type: Medicare Mngd Care /   Secondary Payor: Luther Redo    Functional Assessment   Functional Assessment  Assessment Information Obtained From:: Patient, Significant Other, Chart Review  May We Obtain Collateral Information From Family, Friends and Neighbors?: Yes  Current Mental Status: Awake, Oriented to Person, Oriented to Place, Oriented to Time, Oriented to Situation  Mental Status Prior to Admission: Unable to Assess  Mental Health History: No  Suicide Attempts: No  Activities of Daily Living: Independent  ADL Comments: Independent prior to admission  Work History: Disabled  Marital Status: Separated  Number of children and their names: one minor child  Relative Search Completed: No  Demographics Correct:: No  Demographics Corrected in EPIC:: Yes (updated emergency contact information)  Discharge Destination: Other (Comment) (PT/OT recommend IPR)    Current Living Arrangements   Current Living Arrangements  Current Living Arrangements: Home  Type of Housing: Apartment  Who do you live with?: With Family  What family member?: Significant other and 42 year old daughter  One Story or Two (check all that apply): One Financial planner the number of steps  and rails to enter the residence: No steps to enter  Enter the number of steps and rails inside the residence: N/A  History of Falls?: No    Electrical engineer at Home: DME  DME Current: Rolling walker, Gilmer Mor  Was any abuse reported by patient?: No    Support Systems   Emergency contact: Extended Emergency Contact Information  Primary Emergency Contact: Thompson,Carolyn   [employer] Phone: [phone]  Relation: Sister  Secondary Emergency Contact: Georg [place]  Home Phone: [phone]  Relation: Other    Support Systems  Primary Caregiver: Self  Times of available support: Limited 24/7 hands on (add comment) (lives with significant other, family can assist as needed)  Marital Status: Separated  Number of children and their names: one minor child  Relative Search Completed: No  Demographics Correct:: No  Demographics Corrected in EPIC:: Yes (updated emergency contact information)  Discharge Destination: Other (Comment) (PT/OT recommend IPR)  Next of Kin: Deniece Ree  Next of Kin Relationship:  Sister  Next of Kin Phone Number: [phone]  Assessment Information Obtained From:: Patient, Significant Other, Chart Review    Other Pertinent Information     Patient's sister is LNOK (only adult child, son [name], is deceased), RN Case Manager provided Advance Directives packet at bedside.   Advance Directives (For Healthcare)  Advance Directive: Patient does not have advance directive  No  Advanced Directive: Patient would like information about advanced directives, forgoing or with-drawing life-sustaining treatment, and withholding resuscitative services  Healthcare Agent Appointed: No  Pre-existing DNR/DNI Order: No  Patient Requests Assistance: No    Discharge Plan     RN Case Manager met with patient and significant other at bedside to initiate discussion regarding discharge planning. Introduced self and role of  case management/social work and provided Tour manager. Patient lives with significant other and their 65 year old daughter in a one story apartment, no steps to enter. Patient has a rolling walker and single-point cane, no other DME, no O2 or dialysis needs in community, no history with HHC or facility placement after hospitalization, did outpatient therapy at [facility] after surgery in 2021. Verified PCP information, patient obtains medications through [facility] on [facility] Denies Financial planner, mental health diagnoses or SI.     RN CM explained inpatient rehab referral and insurance approval process, provided facility list at bedside, family will review list and provide choices, will require pre-cert.        Anticipated Discharge Plan: Inpatient rehab    Anticipated Discharge Date: 2-3 days    Anticipated Transportation: medical/stretcher    Patient/Family aware and taking part in the discharge plan.  Patient/family educated that once post-acute care needs have been identified, a provider list applicable to the identified post-acute care needs as well as the insurance provider will be provided, and patient/family have the freedom to choose their provider(s); financial interest(s) are disclosed as appropriate.         Quay Burow, RN CM  Phone Number: [phone]

## 2021-03-19 NOTE — Unmapped (Signed)
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    PCP:  Vevelyn Francois, DO    Home Pharmacy:    [facility] [address], [place] - [address]  [address]  [place] [address]  Phone: [phone]         Medical Insurance Coverage:  Payor: [employer] MANAGED MEDICARE / Plan: [facility] MEDICARE HMO COMPLETE / Product Type: Medicare Mngd Care /     Other Pertinent Information     RN Case Manager participated in interdisciplinary rounds with the MD team and completed chart review. Per team, patient with stroke and chronic M2 occlusion, s/p EVT, pending MRI, anticipate will be medically ready for discharge in 2 to 3 days. PT/OT recommendations pending, if placement will require pre-cert.     Discharge Plan     Anticipated discharge plan:  TBD pending PT/OT evaluation     Anticipated discharge date:  2-3 days     CM/SW will continue to follow and remain available for discharge planning needs.      Quay Burow, RN CM     Cell [phone]

## 2021-03-19 NOTE — Unmapped (Signed)
Pt transferred on CMU to rm. [id number] with 2 bags of belongings and cell phone.RN brought pt to room and armed bed alarm. Report called to [facility] RN.

## 2021-03-19 NOTE — Unmapped (Signed)
Identifying Information:  Name: [name]  MRN: [id number]  DOB: 1955/07/04  Interpreting Physician: Anselmo Pickler  Reporting Physician: Jannet Askew, MD  Referring Physician: Burnett Harry, MD  Date of EEG: 03-19-21  Procedure Location: Inpatient     Clinical History:  [name] is a 66 y.o. male with COPD, T2DM, HTN, hepatitis, tobacco use, and history of prior left posterior frontal infarcts s/p TPA (2017) who presented with aphasia and change in baseline and who was referred for concern for seizures.    Current Medications:   Current Facility-Administered Medications   Medication Dose Frequency Provider Last Admin   ??? aspirin  81 mg Daily with breakfast Sula Soda, MD 81 mg at 03/19/21 1024   ??? atorvastatin  80 mg Nightly (2100) Sula Soda, MD 80 mg at 03/19/21 2102   ??? clopidogreL  75 mg Daily 0900 Leanord Hawking, MD     ??? dextrose 10% in water  12.5 g Q15 Min PRN Hedy Jacob, MD      Or   ??? dextrose 10% in water  25 g Q15 Min PRN Hedy Jacob, MD     ??? glucose  12 g Q15 Min PRN Hedy Jacob, MD     ??? heparin  5,000 Units 3 times per day Sula Soda, MD 5,000 Units at 03/20/21 0459   ??? hydroCHLOROthiazide  25 mg Daily 0900 Sadie Haber, MD 25 mg at 03/19/21 1024   ??? nicotine  1 patch Daily 0900 Ledell Noss, MD     ??? potassium chloride  40 mEq Once Louanne Belton, MD      Followed by   ??? potassium chloride  40 mEq Once Louanne Belton, MD         Indication:  Concern for seizure    Technical Summary:  20 channels of EEG were recorded in a digital format on a patient who was reported to be awake during the recording. The patient not sleep deprived prior to the EEG.    The background rhythm consisted of well-developed, well-regulated 11 Hz alpha activity, maximal over the posterior head regions and reactive to eye opening and closure.     Photic stimulation and hyperventilation were not performed. During the recording stage II sleep was not  seen.    The EKG lead revealed no rhythm abnormalities.    Excessive movement artifact was noted, however this did not limit the study.    EEG Interpretation:   This EEG was within normal limits for a patient of this age in the awake state. No focal, lateralizing, or epileptiform features were seen during the recording.    Clinical correlation is recommended.    Jannet Askew, MD  Neurology PGY-2  [facility]    I have personally reviewed the EEG in its entirety with the resident/fellow and I agree with this report.    03/20/2021  3:53 PM  Electronically signed by Maryanna Shape

## 2021-03-19 NOTE — Unmapped (Signed)
Occupational Therapy  Initial Assessment     Name: [name]  DOB: 07-02-1955  Attending Physician: Burnett HarryMatthew Flaherty, MD  Admission Diagnosis: Cerebrovascular accident (CVA) due to occlusion of left middle cerebral artery (CMS-HCC) [I63.512]  Acute ischemic left MCA stroke (CMS-HCC) [I63.512]  Date: 03/19/2021  Room: 1610/[id number]  Reviewed Pertinent hospital course: Yes    Hospital Course PT/OT: 66 y.o m present w/ acute onset aphasia. (2/19) CTA: M2 occlusion; not candidate for TPA. Neuro IR: diagnostic cerebral angiogram; R femoral artery closure  Relevant PMH : COPD, DM, HLD, HTN, prior CVA, depression, BPH, R parotid mass  Precautions: no limitations  Activity Level: Activity as tolerated    Aide: None    Recommendation  Recommendation: IP Rehab  Equipment Recommendations: Defer at this time    Assessment  Assessment: Decreased Balance, Decreased Functional Mobility, Decreased activity tolerance, Decreased ADL status, Decreased self-care transfers, Decreased IADLs, Decreased Safe judgment during ADL, Decreased cognition                Prognosis for OT goals: Good                       [name] is supine in bed when therapist entered room and agreeable to OT evaluation w/ encouragement. He presents below baseline function demonstrating difficulties with word finding requiring simple questions throughout evaluation. He completes OOB mobility w/ CGA and requires SBA to don socks while seated EOB. He is impulsive throughout evaluation requiring cues for safety. He will continues to benefit from skilled OT services to increase independence and safety with ADLs and IADLs.         Outcome Measures  AM-PAC 6 Clicks Daily Activity Inpatient Short Form: OT 6 Clicks Score: 21    Home Living/Prior Function  Patient able to provide accurate information at this time: Yes, but patient is/may be a poor historian and accuracy should be confirmed  Lives With: Alone  Assistance available: Other (Unsure)  Type of Home:  House  Home Entry: More than 1 step to enter, without railing  Stairs to enter: 3  Home Layout: Two level, Bed/bath upstairs, Able to live on main level with bedroom/bathroom  Bathroom Shower/Tub: Event organiserWalk-in shower  Bathroom Equipment: none  Home Equipment: Agricultural consultantolling Walker, Single-point cane    Prior Function  Functional Mobility: Independent ( no assistive device)  Receives Help From: None needed prior to admission  ADL Assistance: Independent  IADL Assistance: Independent  Vocation: Unemployed  Leisure: Hobbies-yes (Comment)     Pain  Pain Score: 0 - No Pain    Cognition  Overall Cognitive Status: Impaired  Cognitive Assessment: Orientation Level;Arousal/ Alertness;Behavior;Safety Judgment;Following Commands;Insight  Arousal/Alertness: Alert  Orientation Level: Oriented to person;Oriented to place (Oriented to year)  Behavior: Cooperative;Impulsive  Following Commands: Follows one step commands  Safety Judgment: Decreased safety awareness  Insight: Demonstrated decreased insight into limitations and abilities to complete ADLs safely  Comments: Answers yes / no questions    Vision  Overall Vision/ Perception: Within Functional Limits       Right Upper Extremity   Right UE ROM: Grossly WFL as observed during functional activities  Right UE Strength: Grossly WFL (at least 3+/5) as observed during functional activities  Right UE Muscle Tone: Normal  Right Hand Function: Grossly WFL as observed during functional activity         Left Upper Extremity  Left UE ROM: Grossly WFL as observed during functional activities  Left UE Strength: Grossly WFL (  at least 3+/5) as observed during functional activities  Left UE Muscle Tone: Normal  Left UE Hand Function: Grossly WFL as observed during functional activites         Neuromuscular  Overall Sensation: Impaired  Impairments: Light touch  Additional Comments: reports numbness in B feet          Functional Mobility  Bed Mobility   Supine to Sit: Stand by assistance;head of bed  elevated;towards the right  Transfers  Sit to Stand: Contact guard assistance  Stand to Sit: Contact guard assistance  Bed to Chair: Contact guard assistance;stand step;towards the right  Unable to progress further due to: declined further OOB mobility  Balance  Sitting - Static: Stand by Assistance  Sitting-Dynamic: Stand by assistance   Standing-Static: Contact Guard Assistance  Standing-Dynamic: Advertising account executive Assistance    ADL  Lower Body Dressing: Stand-by assistance  Lower Body Dressing Deficit: Don/doff L sock;Don/doff R sock;Supervision/safety  Location Assessed LE Dressing: Seated edge of bed         Position after Treatment/Safety Handoff  Position after therapy session: Chair  Details: RN notified;Call light/ needs within reach  Alarms: Chair  Alarms Status: Activated and Interfaced with call system    Plan  Plan  Treatment Interventions: ADL retraining, Activity Tolerance training, IADL retraining, Patient/Family training, Functional transfer training, Therapeutic Activity  OT Frequency: minimum 3x/week    The plan of care and recommendations assesses the patient's and/or caregiver's readiness, willingness, and ability to provide or support functional mobility and ADL tasks as needed upon discharge.    Goals  Goals to be met in: 1 week  Patient stated goal: to go home  Patient will complete supine to sit in prep for ADLs: Supervision  Patient will complete functional chair transfer: Supervision  Patient will complete toilet transfer: Will tolerate assessment  Patient will complete grooming task: Will tolerate assessment (in standing)  Miscellaneous Goal #1: Pt will be able to identify 5 ADL items w/out cues  Long Term Goal : Pt will tolerate IADL assessment  Long term goal to be met in: 2 weeks  Collaborated with: Patient    Patient/Family Education  Educated patient on the role of occupational therapy, OT goals, OT plan of care, discharge recommendation, ADL training, functional mobility training and the  importance of safety and fall prevention strategies including need for supervision/ assistance with OOB activity and use of call light. patient  verbalized understanding.    OT Time  Start Time: 1610  Stop Time: 0850  Time Calculation (min): 13 min    OT Charges  $OT Evaluation Mod Complex 45 Min: 1 Procedure              Problem List  Patient Active Problem List   Diagnosis   ??? Personal history of other specified diseases(V13.89)   ??? Gastroesophageal reflux disease without esophagitis   ??? Abnormal liver enzymes   ??? Hiatal hernia   ??? 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 Ischemic stroke (CMS-HCC)   ??? 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 Essential hypertension   ??? Type 2 diabetes mellitus with circulatory disorder (HCC)   ??? Chronic obstructive pulmonary disease (CMS-HCC)   ??? Spondylosis of cervical region without myelopathy or radiculopathy   ??? Screen for colon cancer   ??? Lumbar radiculopathy      Past Medical History  Past Medical History:   Diagnosis Date   ??? COPD (chronic obstructive pulmonary disease) (CMS-HCC)    ??? Diabetes mellitus (CMS-HCC)    ??? Dysphagia    ??? GERD (gastroesophageal reflux disease)    ??? Hematuria    ??? Hepatitis    ??? Hypertension      Past Surgical History  Past Surgical History:   Procedure Laterality Date   ??? ESOPHAGOGASTRODUODENOSCOPY N/A 11/19/2013    Procedure: ESOPHAGOGASTRODUODENOSCOPY WITH MAC;  Surgeon: Charlane Ferretti, MD;  Location: [facility] ENDOSCOPY;  Service: Gastroenterology;  Laterality: N/A;   ??? FOOT SURGERY Left    ??? HERNIA REPAIR      about 5 yrs ago

## 2021-03-19 NOTE — Unmapped (Signed)
Speech Language Pathology  Clinical Swallow Assessment    Name: [name]  DOB: 1955/05/13  Attending Physician: Burnett Harry, MD  Admission Diagnosis: Cerebrovascular accident (CVA) due to occlusion of left middle cerebral artery (CMS-HCC) [I63.512]  Acute ischemic left MCA stroke (CMS-HCC) [Z61.096]  Date: 03/19/2021    Reviewed Pertinent hospital course: Yes  Hospital Course SLP: 66 y.o m present w/ acute onset aphasia. (2/19) CTA: M2 occlusion; not candidate for TPA. CXR 2/19: Minimal streaky bibasilar opacities subsegmental atelectasis versus scarring. Lungs are otherwise clear. No pleural effusions or evidence for pneumothorax. Sequela of healed granulomatous disease.  Relevant PMH : COPD, DM, HLD, HTN, prior CVA, depression, BPH, R parotid mass   SLP hx- 2017, outpatient cognitive therapy  Assessment complete?: Yes     Assessment: Patient presents with a functional oropharyngeal swallow with no clinical signs of aspiration with any consistency or reported symptoms of dysphagia. A Regular diet with Thin liquids is recommended and no further acute SLP dysphagia intervention is warranted at this time.    Plan/Recommendation:   - Diet: Regular with Thin liquids  - SLP therapy 1-3x/week while inpatient for language and cognition  - SLP at discharge is recommended for language and cognition    Orientation:  Person: Yes  Place: Yes  Time: No    Pain:  Pain Score: 0 - No Pain    Aspiration Risk  Risk for Aspiration: None  Diet Solids Recommendation: Regular  Diet Liquids Recommendations: No Liquid Consistency Restrictions  Recommended Form of Meds: Whole    Baseline Assessment  History of Intubation: No  Behavior/Cognition: Alert;Cooperative  Dentition: Adequate  Vision: Functional for self-feeding  Patient Positioning: Upright in bed  Volitional Cough: Strong  Volitional Swallow: WFL    Respiratory Status  Respiratory Status: Room air    Cranial Nerve & Laryngeal Function Exam       CNVII - Facial : Within  Functional Limits    CNIX - Glossopharyngeal: Within Functional Limits    CNX - Vagus: Within Functional Limits         Dentition and Hearing  Dentition: Adequate  Hearing Exceptions: None    Consistencies Assessed  Thin;Hard Solid    Thin  Presentation: Straw  Oral: Within functional limits  Pharyngeal: No overt s/s of aspiration      Solid  Presentation: Self Fed  Oral: Within functional limits  Pharyngeal: No overt s/s of aspiration    Patient and Family Education  Patient and Family Education: Patient educated on, role of SLP, current POC, swallowing anatomy & physiology, current diet recommendations, safe swallowing behaviors, risks of aspiration  Education response: Patient demonstrated understanding     Position End of Session:  Patient was left in bed with call light within reach and all needs met.  Safety handoff completed with RN.    Vergia Alberts, MA, CCC-SLP  Speech-Language Pathologist -- Rehab Services  MBSImP Certified Clinician  M-F [phone]    Time  Start Time: [phone]  Stop Time: 1001  Time Calculation (min): 21 min    Charges   $Clinical Swallow: 1 Procedure

## 2021-03-19 NOTE — Unmapped (Signed)
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 Unclear LKN since has been acting off baseline (confused, not aphasic) for the past 1-2 days.   ??  Initially in ED NIHSS 12 (severe aphasia with no clear focal deficits, limited by difficulty understanding). CTH without hemorrhage. CTA with M2 occlusion, CTP without core or penumbra. Not candidate for TPA given unknown LKN. Proceeded to EVT given degree of disabling symptoms although no clear thrombus identified or removed during procedure. Started on DAPT, given aspirin & clopidogrel load. Admitted to NSICU post-procedure for groin checks prior to transfer to Neurology service. Patient agitated prior to transfer, given 1x Haldol 2mg .     Interval History / Subjective     NAEO. This am wakes up to name, AxO x 1. Repeats the same answers occasionally, does follow commands. Refused to allow check the angiogram site    Review of Systems (Focused)     Denies CP, Fever, abd pain. Notes some soreness in angio access site     Medications     See MAR    Vital Signs / Intake & Output     Temp:  [97.4 ??F (36.3 ??C)-98.8 ??F (37.1 ??C)] 98.6 ??F (37 ??C)  Heart Rate:  [68-97] 81  Resp:  [12-20] 16  BP: (146-189)/(81-166) 169/93  FiO2:  [84 %-100 %] 100 %      Intake/Output Summary (Last 24 hours) at 03/19/2021 1346  Last data filed at 03/18/2021 2000  Gross per 24 hour   Intake --   Output 500 ml   Net -500 ml       Physical Exam     Physical Exam:  General Exam: Pt in NAD  HEENT: Normocephalic, atraumatic   CV: RRR, no LE edema  Lungs: Breathing comfortably on room air, no wheezing  Abd: Soft, NT    Neurologic Physical Exam:    Mental Status:  Level of alertness:  Alert and cooperative  Orientation: self, does not state location or year   Attention: Not intact, asks to repeat questions  Memory: Unable to assess   Language: Speech is w/ mild dysarthria  ??? Naming: simple objects - pen, clock  ??? Comprehension: follows simple commands intermittently, struggles with complex commands.  ??? Perseveration present   right-left confusion  No finger agnosia    CN:   CN II: visual acuity grossly intact  CN III/IV/VI: Extraocular movements intact  and Pupils equal, round, reactive to light and with accommodation  CN V: Facial sensation normal in V1, V2, V3  CN VII: facial motion intact and symmetric  CN VIII: Hearing intact to voice  CN IX/X: palate elevation symmetric   CN XI: not tested  CN XII: Tongue protrudes midline    Motor: Normal tone throughout, no atrophy present, 5/5 muscle strength in all   major muscle groups UE & LE, no drift present    Strength:     R L   R L   Deltoid 5 5  Hip Flex 5 5   Biceps 5 5  Knee Ext 5 5   Triceps 5 5  Knee Flex 5 5   Wrist Ext 5 5  Plantar Flex 5 5   Wrist Flex 5 5  Ankle Dorsiflex 5 5   IO 5 5       Grip 5 5           Sensory: intact diffusely to light touch    Coordination/Cerebellum: No dysmetria noted      Laboratory Data     Reviewed in Epic     Diagnostic Studies     CT Angio Head W and or WO    Result Date: 03/18/2021  IMPRESSION: Neck 1.  Mild atherosclerotic disease with no significant carotid or vertebral artery stenosis. 2.  No dissection or pseudoaneurysm. Head 1.  Short segment occlusion of one of the left M2 branches in the sylvian fissure. Multifocal stenosis of this M2 branch distally. 2.  Moderate stenosis of the right M2 posterior branch. Report Verified by: Cleatis Polka, MD at 03/18/2021 4:37 PM EST    CT Angio Neck W and or WO    Result Date: 03/18/2021  IMPRESSION: Neck 1.  Mild atherosclerotic disease with no significant carotid or vertebral artery stenosis. 2.  No dissection or pseudoaneurysm. Head 1.  Short segment occlusion of  one of the left M2 branches in the sylvian fissure. Multifocal stenosis of this M2 branch distally. 2.  Moderate stenosis of the right M2 posterior branch. Report Verified by: Cleatis Polka, MD at 03/18/2021 4:37 PM EST    X-ray Portable Chest    Result Date: 03/18/2021  IMPRESSION: No acute cardiopulmonary abnormality. Approved by Vira Blanco, MD on 03/18/2021 4:41 PM EST I have personally reviewed the images and I agree with this report. Report Verified by: Baird Cancer, MD at 03/18/2021 4:46 PM EST    Code Stroke-CT Head WO contrast    Result Date: 03/18/2021  IMPRESSION: 1.  Diffuse atrophy. 2.  Nonspecific white matter disease likely chronic microvascular ischemia. 3.  No intracranial hemorrhage or mass effect. Report Verified by: Cleatis Polka, MD at 03/18/2021 3:28 PM EST    CT Cerebral Perfusion With IV contrast    Result Date: 03/18/2021  IMPRESSION: No area of hypoperfusion or ischemia. 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 Unclear whether patient has been taking outpatient medications, has history of noncompliance    - Continue home ASA 81mg  daily, atorvastatin 80mg  qHS   - Started on clopidogrel 75mg  daily (s/p 300mg  load 2/19) for intracranial atherosclerosis   - Will reevaluate after MRI  - Risk stratification labs: lipid profile, A1C, TSH, UDS  - MRI brain w/o   - Frequent neurochecks per unit routine; repeat head CT if any changes in exam  - Groin checks q4hrs post-cath  - TTE with bubble pending  - BP goal:   - Started HCTZ 25mg , up titrate for SBP goal < 160  - Home regimen:  amlodipine 10mg , HCTZ 12.5mg , losartan 100mg ,   - Can consider nicardipine gtt if BP remains uncontrolled    - Stroke rehab: bedside dysphagia screen by nursing; PT/OT/SLP  - DVT prophylaxis with SQH TID   -  Telemetry and pOx   ??  COPD  - Continuous pulse oximetry  - Per Pulmonology note, was supposed to be on Spiriva although no fills per retail rx dispense history   - Hold home Albuterol PRN  ??  BPH  - Hold tamsulosin 0.4mg  daily  ??  T2DM  - HA1C pending  - Maintain BG<180, SSI if needed  - Q6H BG checks while NPO  ??  Psych/Pain  - S/p Haldol   - Holding home medications  ????????????????????????Aripiprazole 5mg  daily    Checklist:  Fluids: prn  Electrolytes: prn  Nutrition: regular  Telemetry: yes  DVT PPx: SubQ Heparin  GI PPx: not indicated  Restraints: none  Lines: pIV x 2  Catheter: none  Labs: daily   Family: 2/20    Disposition: Floor    Code Status: Full Code    Signed:  Ledell Noss, MD   Internal Medicine PGY-1   2/20/20233:48 PM

## 2021-03-19 NOTE — Unmapped (Signed)
Problem: Potential for Compromised Skin Integrity  Goal: Skin integrity is maintained or improved  Description: Assess and monitor skin integrity. Identify patients at risk for skin breakdown on admission and per policy. Collaborate with interdisciplinary team and initiate plans and interventions as needed.  Outcome: Progressing

## 2021-03-19 NOTE — Unmapped (Signed)
[facility]  Case Management/Social Work Department  Discharge Planning Screen    Screening Questions     Do you need help filling out medical forms: Yes  Is patient from anywhere other than a private residence? (shelter, SNF, LTC, IPR, LTAC, etc.): No  Do you have any services that come into the house to help you? (COA, private duty, HHC, etc): No  Do you have barriers getting to follow up appointment or obtaining prescriptions?: No  Have you been to the (ED/hospital) 4x times in the past 6 months? : No    Patient meet criteria for full assessment; SW/CM will follow up.     Milley Vining Dugan-[place] MSN, RN CM  [phone]

## 2021-03-19 NOTE — Unmapped (Signed)
Pt returned to room from [facility].  VSS. WCTM and provide pt care.

## 2021-03-19 NOTE — Unmapped (Signed)
Pt transferred to MRI.  CMU notified. Pt showing no s/s of distress when leaving unit.

## 2021-03-19 NOTE — Unmapped (Signed)
Stroke Nurse Navigators have been consulted.  Will follow closely in coordination with primary team.  Please call Stroke Nurse Navigators with any needs:  [phone].

## 2021-03-19 NOTE — Unmapped (Addendum)
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 (2/19) CTA: M2 occlusion; not candidate for TPA. CXR 2/19: Minimal streaky bibasilar opacities subsegmental atelectasis versus scarring. Lungs are otherwise clear. No pleural effusions or evidence for pneumothorax. Sequela of healed granulomatous disease.  Relevant PMH : COPD, DM, HLD, HTN, prior CVA, depression, BPH, R parotid mass   SLP hx- 2017, outpatient cognitive therapy  Assessment complete?: Yes     Assessment: Patient presents with mild receptive aphasia characterized by reduced auditory comprehension in the domains of command following. Pt had difficulty following 2 and 3-step commands, requiring break-down of 3-step commands and repetition of directions. Yes/no responses were appropriate. Expressive language was intact. No paraphasic errors noted in this exam. Patient is also presenting with cognitive deficits. Patient was oriented to self and place. Not oriented to year or month (1983). Re-oriented to year but after a delay, patient again stated 1983, concerning for STM deficits. Unclear of patient's baseline language or cognitive status given hx of L MCA. Acute SLP intervention is warranted to further assess language vs cognitive abilities. Will complete formal cognitive evaluation as able, unable to complete today due to EKG beginning.     Plan/Recommendation:   - Diet: Regular with Thin liquids  - SLP therapy 1-3x/week while inpatient  - SLP at discharge is recommended     Prognosis  Diagnosis: Receptive aphasia, Cognitive-linguistic  deficits    Goals  Goals Cognitive-Linguistics  Patient's goal is: none stated.  Patient will be oriented: x4 with visual cues.  Patient will recall: 3/4 pieces of new information given 5 min delay with min cues.  Patient will attend to task: with 90% acc with no cuse.     Goals Language  Patient's goal is: none stated.  Patient will follow commands: with 90% acc with min cues.  Time frame for goals to be met in: 03/30/21    Problem List  Patient Active Problem List   Diagnosis   ??? Personal history of other specified diseases(V13.89)   ??? Gastroesophageal reflux disease without esophagitis   ??? Abnormal liver enzymes   ??? Hiatal hernia   ??? 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 Ischemic stroke (CMS-HCC)   ??? 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 Essential hypertension   ??? Type 2 diabetes mellitus with circulatory disorder (HCC)   ??? Chronic obstructive pulmonary disease (CMS-HCC)   ??? Spondylosis of cervical region without myelopathy or radiculopathy   ??? Screen for colon cancer   ??? Lumbar radiculopathy        Past Medical History  Past Medical History:   Diagnosis Date   ??? COPD (chronic obstructive pulmonary disease) (CMS-HCC)    ??? Diabetes mellitus (CMS-HCC)    ??? Dysphagia    ??? GERD (gastroesophageal reflux disease)    ??? Hematuria    ??? Hepatitis    ??? Hypertension         Past Surgical History  Past Surgical History:   Procedure Laterality Date   ???  ESOPHAGOGASTRODUODENOSCOPY N/A 11/19/2013    Procedure: ESOPHAGOGASTRODUODENOSCOPY WITH MAC;  Surgeon: Charlane Ferretti, MD;  [facility]: [facility] ENDOSCOPY;  Service: Gastroenterology;  Laterality: N/A;   ??? FOOT SURGERY Left    ??? HERNIA REPAIR      about 5 yrs ago       Orientation:  Person: Yes  Place: Yes  Time: No    Pain  Pain Score: 0 - No Pain    Respiratory Status  Respiratory Status: Room air    Receptive Language  Basic Questions: WFL  Complex Questions:  WFL  One Step Basic Commands: Within Functional Limits  Two Step Basic Commands: Mild 75-90%  Multistep Basic Commands: Severe 25-50%  Conversation: Within Functional Limits    Expressive Language  Primary Mode of Expression: Verbal  Primary Language: English  Confrontation Naming: Within Functional Limits  Repetition: Within Functional Limits  Open Ended Questions: Within Functional Limits  Conversation: Within Functional Limits  Topic Initiation: Within Functional Limits  Topic Maintenance: Within Functional Limits  Turn Taking: Within Functional Limits  Perseveration: Not present    Cognitive  Attention: Impaired  Memory: Impaired  Safety/Judgement: Impaired  Processing Speed: Mild 75-90%    Cranial Nerve & Laryngeal Function Exam       CNVII - Facial : Within Functional Limits    CNIX - Glossopharyngeal: Within Functional Limits    CNX - Vagus: Within Functional Limits         Dentition and Hearing  Dentition: Adequate  Hearing Exceptions: None    Patient Education  Patient educated on: role of SLP, current POC, and discharge recommendations for SLP therapy;cognitive function and strategies to improve cognition;expressive and receptive language, aphasia, and strategies to improve language  Patient response: Patient verbalized understanding     Position End of Session:  Patient was left in bed with call light within reach and all needs met.  Safety handoff completed with RN.    Vergia Alberts, MA, CCC-SLP  Speech-Language Pathologist -- Rehab Services  MBSImP Certified Clinician  M-F [phone]    Time  Start Time: [phone]  Stop Time: 1001  Time Calculation (min): 21 min    Charges   $Eval Speech Sound Prd w/Lng Comp & Expr: 1 Procedure

## 2021-03-19 NOTE — Unmapped (Signed)
Problem: Potential for Compromised Skin Integrity  Goal: Skin integrity is maintained or improved  Description: Assess and monitor skin integrity. Identify patients at risk for skin breakdown on admission and per policy. Collaborate with interdisciplinary team and initiate plans and interventions as needed.  Outcome: Progressing     Problem: Incontinence  Goal: Perineal skin integrity is maintained or improved  Description: Assess genitourinary system, perineal skin, labs (urinalysis), and history of incontinence to include past management, aggravating, and alleviating factors.  Collaborate with interdisciplinary team and initiate plans and interventions as needed.  Outcome: Progressing     Problem: Activity Intolerance/Impaired Mobility  Goal: Mobility/activity is maintained at optimum level for patient  Description: Assess and monitor patient  barriers to mobility and need for assistive/adaptive devices. Assess patient's emotional response to limitations. Collaborate with interdisciplinary team and initiate plans and interventions as ordered.  Outcome: Progressing     Problem: Patient will remain free of injury d/t fall  Goal: High Fall Risk Precautions  Outcome: Progressing

## 2021-03-19 NOTE — Unmapped (Signed)
Per Neuro MD [place] pt is permitted to visit his brother in [facility] bed 4.  RN supervisor Brad transported pt off floor to [facility] via wheelchair.  [facility]D Charge RN aware that pt will be visiting.  CMU RN Minerva Areola called and notified. Pt showing no s/s of distress when leaving unit.

## 2021-03-19 NOTE — Unmapped (Signed)
Problem: Potential for Compromised Skin Integrity  Goal: Skin integrity is maintained or improved  Description: Assess and monitor skin integrity. Identify patients at risk for skin breakdown on admission and per policy. Collaborate with interdisciplinary team and initiate plans and interventions as needed.  Outcome: Progressing     Problem: Incontinence  Goal: Perineal skin integrity is maintained or improved  Description: Assess genitourinary system, perineal skin, labs (urinalysis), and history of incontinence to include past management, aggravating, and alleviating factors.  Collaborate with interdisciplinary team and initiate plans and interventions as needed.  Outcome: Progressing     Problem: Activity Intolerance/Impaired Mobility  Goal: Mobility/activity is maintained at optimum level for patient  Description: Assess and monitor patient  barriers to mobility and need for assistive/adaptive devices. Assess patient's emotional response to limitations. Collaborate with interdisciplinary team and initiate plans and interventions as ordered.  Outcome: Progressing     Problem: Patient will remain free of injury d/t fall  Goal: High Fall Risk Precautions  Outcome: Progressing

## 2021-03-19 NOTE — Unmapped (Signed)
Physical Therapy  Initial Assessment     Name: [name]  DOB: Oct 13, 1955  Attending Physician: Burnett Harry, MD  Admission Diagnosis: Cerebrovascular accident (CVA) due to occlusion of left middle cerebral artery (CMS-HCC) [I63.512]  Acute ischemic left MCA stroke (CMS-HCC) [I63.512]  Date: 03/19/2021  Room: 1610/R6045  Reviewed Pertinent hospital course: Yes    Hospital Course PT/OT: 66 y.o m present w/ acute onset aphasia. (2/19) CTA: M2 occlusion; not candidate for TPA. Neuro IR: diagnostic cerebral angiogram; R femoral artery closure  Relevant PMH : COPD, DM, HLD, HTN, prior CVA, depression, BPH, R parotid mass  Precautions: no limitations  Activity Level: Activity as tolerated    Aide: n/a    Assessment  Assessment: Impaired Balance, Impaired Cognition, Impaired Bed Mobility, Impaired Strength, Deconditioning, Impaired Transfers, Impaired Stair Negotiation, Impaired Gait, Impaired Safety Awareness, Impaired Activity Tolerance  Co-treatment indicated: to integrate multiple skills simultaneously in order to challenge patient and advance progress  Prognosis: Good  Pt tolerated therapy eval fairly. Pt presents below reported baseline and demos decreased insight to deficits. Pt would continue to benefit from skilled therapy to address impairments and maximize independence.     Recommendation  Recommendation: IP Rehab  Equipment Recommended: Defer to facility to obtain         Outcome Measures  AM-PAC 6 Clicks Basic Mobility Inpatient Short Form: PT 6 Clicks Score: 17          Mobility Recommendations for Staff  Patient ability: Patient transfers to chair/ bedside commode  Assist needed: with 1 person assist    Home Living/Prior Function  Patient able to provide accurate information at this time: Yes, but patient is/may be a poor historian and accuracy should be confirmed  Lives With: Alone  Assistance available: Other (Unsure)  Type of Home: House  Home Entry: More than 1 step to enter;without  railing  Stairs to enter: 3  Home Layout: Two level;Bed/bath upstairs;Able to live on main level with bedroom/bathroom  Bathroom Shower/Tub: Event organiser: none  Home Equipment: Rolling Walker;Single-point cane  Prior Function  Functional Mobility: Independent ( no assistive device)  Receives Help From: None needed prior to admission  ADL Assistance: Independent  IADL Assistance: Independent  Vocation: Unemployed     Pain  Pain Score: 0 - No Pain    Vision  Vision/Perception  Overall Vision/ Perception: Within Functional Limits       Cognition  Overall Cognitive Status: Impaired  Cognitive Assessment: Orientation Level;Arousal/ Alertness;Behavior;Safety Judgment;Following Commands;Insight  Arousal/Alertness: Alert  Orientation Level: Oriented to person;Oriented to place (Oriented to year)  Behavior: Cooperative;Impulsive  Following Commands: Follows one step commands  Safety Judgment: Decreased safety awareness  Insight: Demonstrated decreased insight into limitations and abilities to complete ADLs safely  Comments: Answers yes / no questions    Neuromuscular  Overall Sensation: Impaired  Impairments: Light touch  Additional Comments: reports numbness in B feet          Upper Extremity  UE Assessment: Defer to OT evaluation for formal assessment    Lower Extremity  Lower Extremity  LE Assessment: Strength WFL (at least 3+/5) as observed during functional activity          Functional Mobility  Bed Mobility   Supine to Sit: Stand by assistance;head of bed elevated  Transfers  Sit to Stand: Contact guard assistance  Stand to Sit: Contact guard assistance  Bed to Chair: Minimal assistance  Gait  Distance (in feet): 3 ft  Level  of assistance: Minimal assistance  Assistive Device: None  Gait Characteristics: Unsteady (decreased safety awareness, impulsive and unable to further assess ambulation)  Balance  Sitting - Static: Supervision  Sitting-Dynamic: Minimal Assistance;Contact Guard Assistance    Standing-Static: Advertising account executiveContact Guard Assistance  Standing-Dynamic: Advertising account executiveContact Guard Assistance;Minimal Assistance         Position after Therapy/Safety Handoff  Position after treatment and safety handoff  Position after therapy session: Recliner  Details: RN notified;Call light/ needs within reach  Alarms: Chair  Alarms Status: Activated and Interfaced with call system    Goals  Collaborated with: Patient  Patient Stated Goal: to go home  Goals to be met by: 03/26/21  Patient will transition from supine to sit: Independent  Patient will transition from sit to supine: Independent  Patient will transfer from sit to stand: Independent  Patient will transfer bed/chair: Independent  Patient will ambulate: Independent, distance (in feet)  Distance (in feet): 150 ft  Patient will go up / down stairs: Will tolerate assessment  Long-term goal to be met by: 04/02/21  Long Term Goal : Pt will ascend/descend 3 steps independently     Patient/Family Education  Educated patient on the role of physical therapy, goals, plan of care, importance of increased activity, discharge recommendations, transfer training, gait training and safety and need for supervision during OOB activity and fall prevention strategies, including need for supervision/ assistance with OOB activity and use of call light; patient verbalized understanding. Handout(s) issued: n/a.    Plan  Plan  Treatment/Interventions: LE strengthening/ROM, Gait training, Neuromuscular Reeducation, Endurance training, Stair Training, Therapeutic Exercise, Therapeutic Activity  PT Frequency: minimum 3x/week    The plan of care and recommendations assesses the patient's and/or caregiver's readiness, willingness, and ability to provide or support functional mobility and ADL tasks as needed upon discharge.        Time  Start Time: 0837  Stop Time: 0850  Time Calculation (min): 13 min    Charges   $PT Evaluation Mod Complex 30 Min: 1 Procedure                    Problem List  Patient Active  Problem List   Diagnosis   ??? Personal history of other specified diseases(V13.89)   ??? Gastroesophageal reflux disease without esophagitis   ??? Abnormal liver enzymes   ??? Hiatal hernia   ??? 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 Ischemic stroke (CMS-HCC)   ??? 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 Essential hypertension   ??? Type 2 diabetes mellitus with circulatory disorder (HCC)   ??? Chronic obstructive pulmonary disease (CMS-HCC)   ??? Spondylosis of cervical region without myelopathy or radiculopathy   ??? Screen for colon cancer   ??? Lumbar radiculopathy      Past Medical History  Past Medical History:   Diagnosis Date   ??? COPD (chronic obstructive pulmonary disease) (CMS-HCC)    ??? Diabetes mellitus (CMS-HCC)    ??? Dysphagia    ??? GERD (gastroesophageal reflux disease)    ??? Hematuria    ??? Hepatitis    ??? Hypertension       Past Surgical History  Past Surgical History:   Procedure Laterality Date   ??? ESOPHAGOGASTRODUODENOSCOPY N/A 11/19/2013    Procedure: ESOPHAGOGASTRODUODENOSCOPY WITH MAC;  Surgeon: Charlane FerrettiKrishna M Rayapudi, MD;  [facility];  Service: Gastroenterology;  Laterality:  N/A;   ??? FOOT SURGERY Left    ??? HERNIA REPAIR      about 5 yrs ago

## 2021-03-20 ENCOUNTER — Encounter

## 2021-03-20 LAB — RENAL FUNCTION PANEL W/EGFR
Albumin: 3.9 g/dL (ref 3.5–5.7)
Anion Gap: 7 mmol/L (ref 3–16)
BUN: 16 mg/dL (ref 7–25)
CO2: 28 mmol/L (ref 21–33)
Calcium: 9.2 mg/dL (ref 8.6–10.3)
Chloride: 101 mmol/L (ref 98–110)
Creatinine: 1.17 mg/dL (ref 0.60–1.30)
EGFR: 69
Glucose: 104 mg/dL (ref 70–100)
Osmolality, Calculated: 283 mOsm/kg (ref 278–305)
Phosphorus: 3.8 mg/dL (ref 2.1–4.5)
Potassium: 3.3 mmol/L (ref 3.5–5.3)
Sodium: 136 mmol/L (ref 133–146)

## 2021-03-20 LAB — HEMOGLOBIN A1C: Hemoglobin A1C: 5.8 % (ref 4.0–5.6)

## 2021-03-20 LAB — CBC
Hematocrit: 40.9 % (ref 38.5–50.0)
Hemoglobin: 14.4 g/dL (ref 13.2–17.1)
MCH: 31.9 pg (ref 27.0–33.0)
MCHC: 35.2 g/dL (ref 32.0–36.0)
MCV: 90.7 fL (ref 80.0–100.0)
MPV: 8.6 fL (ref 7.5–11.5)
Platelets: 163 10*3/uL (ref 140–400)
RBC: 4.51 10*6/uL (ref 4.20–5.80)
RDW: 14.1 % (ref 11.0–15.0)
WBC: 6.4 10*3/uL (ref 3.8–10.8)

## 2021-03-20 LAB — MAGNESIUM: Magnesium: 2.3 mg/dL (ref 1.5–2.5)

## 2021-03-20 LAB — TSH: TSH: 1.03 u[IU]/mL (ref 0.45–4.12)

## 2021-03-20 LAB — POC GLU MONITORING DEVICE: POC Glucose Monitoring Device: 117 mg/dL (ref 70–100)

## 2021-03-20 MED ORDER — tamsulosin (FLOMAX) capsule 0.4 mg
0.4 | Freq: Every evening | ORAL | Status: AC
Start: 2021-03-20 — End: 2021-03-21
  Administered 2021-03-21: 02:00:00 0.4 mg via ORAL

## 2021-03-20 MED ORDER — potassium chloride (KLOR-CON M20) CR tablet 40 mEq
20 | Freq: Once | ORAL | Status: AC
Start: 2021-03-20 — End: 2021-03-20
  Administered 2021-03-20: 18:00:00 40 meq via ORAL

## 2021-03-20 MED ORDER — cloNIDine HCL (CATAPRES) tablet 0.2 mg
0.1 | Freq: Two times a day (BID) | ORAL | Status: AC
Start: 2021-03-20 — End: 2021-03-21
  Administered 2021-03-20 – 2021-03-21 (×2): 0.2 mg via ORAL

## 2021-03-20 MED ORDER — potassium chloride (KLOR-CON M20) CR tablet 40 mEq
20 | Freq: Once | ORAL | Status: AC
Start: 2021-03-20 — End: 2021-03-20
  Administered 2021-03-20: 13:00:00 40 meq via ORAL

## 2021-03-20 MED ORDER — labetaloL (NORMODYNE) injection 10 mg
5 | Freq: Once | INTRAVENOUS | Status: AC
Start: 2021-03-20 — End: 2021-03-20
  Administered 2021-03-20: 06:00:00 10 mg via INTRAVENOUS

## 2021-03-20 MED ORDER — ARIPiprazole (ABILIFY) tablet 5 mg
5 | Freq: Every evening | ORAL
Start: 2021-03-20 — End: 2021-03-21
  Administered 2021-03-21: 02:00:00 5 mg via ORAL

## 2021-03-20 MED FILL — NICOTINE 7 MG/24 HR DAILY TRANSDERMAL PATCH: 7 7 mg/24 hr | TRANSDERMAL | Qty: 1

## 2021-03-20 MED FILL — CLOPIDOGREL 75 MG TABLET: 75 75 mg | ORAL | Qty: 1

## 2021-03-20 MED FILL — HEPARIN (PORCINE) 5,000 UNIT/ML INJECTION SOLUTION: 5000 5,000 unit/mL | INTRAMUSCULAR | Qty: 1

## 2021-03-20 MED FILL — LABETALOL 5 MG/ML INTRAVENOUS SOLUTION: 5 5 mg/mL | INTRAVENOUS | Qty: 20

## 2021-03-20 MED FILL — POTASSIUM CHLORIDE ER 20 MEQ TABLET,EXTENDED RELEASE(PART/CRYST): 20 20 MEQ | ORAL | Qty: 2

## 2021-03-20 MED FILL — CLONIDINE HCL 0.1 MG TABLET: 0.1 0.1 MG | ORAL | Qty: 2

## 2021-03-20 MED FILL — TAMSULOSIN 0.4 MG CAPSULE: 0.4 0.4 mg | ORAL | Qty: 1

## 2021-03-20 MED FILL — ATORVASTATIN 80 MG TABLET: 80 80 MG | ORAL | Qty: 1

## 2021-03-20 MED FILL — ARIPIPRAZOLE 5 MG TABLET: 5 5 MG | ORAL | Qty: 1

## 2021-03-20 MED FILL — ASPIRIN 81 MG CHEWABLE TABLET: 81 81 MG | ORAL | Qty: 1

## 2021-03-20 MED FILL — HYDROCHLOROTHIAZIDE 25 MG TABLET: 25 25 MG | ORAL | Qty: 1

## 2021-03-20 NOTE — Unmapped (Signed)
Stroke Nurse Navigator Note:  Reviewed personalized stroke booklet with patient and his SO, Bethany. Verified demographics and PCP.  Discussed AIS.  Discussed stroke risk factors of prior stroke, HTN, DM, smoking.  Mutually agreed upon goal:   Monitor BP & record, eat a heart healthy/ADA/TLC diet, quit smoking.  Discussed importance of taking medications as prescribed, especially ASA/Plavix/statin.  Reviewed cholesterol levels and the use of a statin for secondary stroke prevention.  Reviewed BEFAST and when to call 911.  Discussed following up with PCP within 1-2 weeks once home.  Gave stroke follow up appointment info with Dr. Abelina BachelorFlaherty.  Stroke Nurse Navigator contact info given for questions.

## 2021-03-20 NOTE — Unmapped (Addendum)
[facility]  Case Management/Social Work Department  Progress Note    Patient Information     Patient Name: [name]  MRN: [id number]  Hospital day: 2  Inpatient/Observation:  Inpatient   Level of Care:  Floor Status  Admit date:  03/18/2021  Admission diagnosis: Cerebrovascular accident (CVA) due to occlusion of left middle cerebral artery (CMS-HCC) [I63.512]  Acute ischemic left MCA stroke (CMS-HCC) [I63.512]    PMH:  has a past medical history of COPD (chronic obstructive pulmonary disease) (CMS-HCC), Diabetes mellitus (CMS-HCC), Dysphagia, GERD (gastroesophageal reflux disease), Hematuria, Hepatitis, and Hypertension.    PCP:  Vevelyn Francois, DO    Home Pharmacy:    [facility] [address] [place] - [address]  [address] [place] [address]  Phone: [phone]     [facility]  [address]  [place] [address]  Phone: [phone]         Medical Insurance Coverage:  Payor: [employer] MANAGED MEDICARE / Plan: [facility] MEDICARE HMO COMPLETE / Product Type: Medicare Mngd Care /     Other Pertinent Information     RN Case Manager participated in interdisciplinary rounds with the MD team and completed chart review. Per team, patient with left MCA stroke, pending echo this morning and will then be medically ready for discharge. PT/OT recommend inpatient rehab, patient and family reviewing facility list, RN CM will follow up on choices, will require pre-cert.     1130 - RN CM to bedside for facility choices, patient stated he hadn't looked over the list yet and requested RN CM return a little later when his significant other is back at the bedside.     1420 - RN CM to bedside, requested referral be sent to [facility], RN CM sent referral.     1435 - RN CM received call from [facility] [phone]), stated they can accept, will start pre-cert today and will go to bedside to meet with patient and family.     Discharge Plan     Anticipated discharge  plan:  Inpatient rehab     Anticipated discharge date:  2-3 days     CM/SW will continue to follow and remain available for discharge planning needs.      Quay Burow, RN CM     Cell [phone]

## 2021-03-20 NOTE — Unmapped (Signed)
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  CTH without hemorrhage. CTA with M2 occlusion, CTP without core or penumbra. Not candidate for TPA given unknown LKN. Proceeded to EVT given degree of disabling symptoms although no clear thrombus identified or removed during procedure.??Started on DAPT, given aspirin & clopidogrel load.??Admitted to NSICU post-procedure for groin checks prior to transfer to Neurology service.??Patient agitated prior to transfer, given 1x Haldol 2mg .??    Interval History / Subjective     - MRI Last night  - Visited brother in [place] last night  - Required labetalol x1 overnight      Review of Systems (Focused)     Respiratory: Negative for cough and shortness of breath.  Cardiovascular: Negative for chest pain, palpitations, or leg swelling.  Gastrointestinal: Negative for abdominal pain, N/V, diarrhea and constipation.  Neurological: Negative for new numbness, tingling, or focal weakness    Medications     See MAR    Vital Signs / Intake & Output     Temp:  [97.7 ??F (36.5 ??C)-99.3 ??F (37.4 ??C)] 97.7 ??F (36.5 ??C)  Heart Rate:  [65-87] 65  Resp:  [16-18] 16  BP: (113-186)/(88-116) 149/95      Intake/Output Summary (Last 24 hours) at 03/20/2021 1243  Last data filed at 03/19/2021 1943  Gross per 24 hour   Intake 960 ml   Output 0 ml   Net 960 ml       Physical Exam     Physical Exam:  General Exam: Pt in NAD  HEENT: NCAT  CV: RRR, no edema  Lungs: Breathing comfortably on room air, intermittent productive-sounding cough  Abd: Soft, NT      Neurologic Physical Exam:    Cognitive: Alert and  Oriented to person, place, situation.  Some perseveration repeats UC, UC , UC when asked the year.  Knows how much a nickle, quarter and dime is added together    Language/Speech: Language without aphasia. Speech spontaneous and fluent, no paraphasic errors, no slurring of speech, able to name objects, able to repeat, able to perform ???ka-pa-ta???    CN:   CN II: visual acuity grossly intact  CN III/IV/VI: Extraocular movements intact  and Pupils equal, round, reactive to light  CN V: Facial sensation normal in V1, V2, V3  CN VII: facial motion intact and symmetric  CN VIII: Hearing intact to voice  CN IX/X: palate elevation symmetric   CN XI: sternocleiomastoid 5/5 symmetric strength  CN XII: Tongue protrudes midline    Motor: Normal tone throughout, no atrophy present, 5/5 muscle strength in all   major muscle groups UE & LE, no drift present    Strength:     R L   R L   Deltoid 5 5  Hip Flex 5 5   Biceps 5 5  Knee Ext 5 5   Triceps 5 5  Knee Flex 5 5       Sensory: intact diffusely to light touch    Deep Tendon Reflexes:     Deferred assessment    Coordination/Cerebellum: no tremor    Gait: normal gait  and base w/ standard walk    Laboratory Data     Reviewed in Epic     Diagnostic Studies     Imaging reviewed, notable for:    MRI brain with recent left parieto-occipital infarct    Assessment & Plan     #Aphasia  #Left M2 occlusion   Presenting symptoms concerning for stroke and MRI showed recent left parieto-occipital infarct.  Also considered TIA though symptoms persisted for >24 hours and also considered seizure given MRI findings (but rEEG wnl). No clear thrombosis identified during attempted intervention. Unclear whether patient has been taking outpatient medications, has history of non adherence.  UDS negative for substances.  -??Continue home??ASA 81mg  daily, atorvastatin 80mg  qHS??  - Started on clopidogrel 75mg  daily (s/p 300mg  load 2/19)??for intracranial atherosclerosis - continue given MRI findings  - Risk  stratification labs: lipid profile, A1C, TSH  - Frequent neurochecks per unit routine; repeat head CT if any changes in exam  - TTE with bubble pending  - BP goal: normotension  - Continue HCTZ 25mg   - resume home clonidine 0.2mg  BID  - if persistently hypertensive, start home ARB  - Home regimen: amlodipine 10mg , HCTZ 12.5mg , losartan 100mg , clonidine 0.2mg  BID  ??  - Stroke rehab: bedside dysphagia screen by nursing; PT/OT/SLP  - DVT prophylaxis with SQH TID   - Telemetry and pOx??  - Cardiac event monitor at discharge  ??  #COPD  Has not been filling inhalers.  Breathing comfortably on RA, no wheezing on exam. Per Pulmonology note, was supposed to be on Spiriva although no fills per retail rx dispense history??  - Continuous pulse oximetry  - Can add albuterol if dyspneic  ??  #BPH  -??Resume home??tamsulosin 0.4mg  daily  ??  #T2DM  -??HA1C pending  - Maintain BG<180, SSI if needed  - Q6H BG checks while NPO  ??  #Psych/Pain  S/p Haldol   - Resume home Abilify 5mg  qday    Checklist:  Fluids: none  Electrolytes: replete PRN  Nutrition: diet regular liquid thin  Telemetry: yes  DVT PPx: SQH  GI PPx: not indicated  Restraints: no  Lines: PIV  Catheter: no  Labs: reviewed  Family: will update PRN    Disposition: Floor, likely discharge tomorrow pending TTE and better BP control  Code Status: Full      Code Status: Full Code    Signed:  Louanne Belton, MD  Internal Medicine, PGY-1  03/20/2021, 12:43 PM      Additional Fellow Details:  ??  66 yo m w/ pmhx of prior L MCA stroke (L M3 occlusion s/p tPA in 2017), HTN, DMII, R parotid mass awaiting excision/bx who presented w/ acute aphasia and had a CTA suggestive of a L M2 occlusion though on DSA only moderate stenosis was present without thombus despite no intervention.  ??        ??  A/P??for Neurologic Problems and Active Medical Disease  ??  #L MCA/PCA watershed stroke  #L M2 stenosis  Suspect either that a thrombus formed initially and autolysed or CTA may have been a overread of  stenosis but the LM2 stenosis is relatively likely to be the mechanism though a cardioembolic cause cannot be excluded.   - DAP w/ asa and plavix x 90 days per SAMMPRIS criteria  - Cardiac event monitor at discharge  - TTE pending  ??  #HTN  Severe and requiring multiple medications outpatient. Still not well controlled which is primary limitation to discharge.  -  Continue HCTZ, restarted home clonidine, and flomax  ??  Dispo: Floor, to IPR pending stroke/seizure workup  ??  Antionette Char, DO  PGY 5, Vascular Neurology Fellow

## 2021-03-21 ENCOUNTER — Inpatient Hospital Stay: Payer: Medicare (Managed Care)

## 2021-03-21 ENCOUNTER — Encounter

## 2021-03-21 MED ORDER — clopidogreL (PLAVIX) 75 mg tablet
75 | ORAL_TABLET | Freq: Every day | ORAL | 2 refills | Status: AC
Start: 2021-03-21 — End: 2021-04-06

## 2021-03-21 MED FILL — CLONIDINE HCL 0.1 MG TABLET: 0.1 0.1 MG | ORAL | Qty: 2

## 2021-03-21 NOTE — Unmapped (Signed)
[facility]  Department of Neurology  Discharge Summary    Patient: [name]   MRN: [id number] CSN: [id number]  Date of Admission: 03/18/2021  Date of Discharge: 03/21/2021   Attending Physician: No att. 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       Operations/Procedures Performed (include dates)     EEG: routine, 03-19-2021   03/18/2021: diagnostic cerebral angiogram   - R femoral artery closure with 23f angioseal    Imaging (include dates)     MRI Head WO contrast   Final Result   Addendum (preliminary) 1 of 1   ** ADDENDUM #1**   The impression should read:       IMPRESSION:          1. Recent Left parieto-occipital infarct.   2. No intracranial mass or hemorrhage.      Report Verified by: Burt Ek, MD at 03/20/2021 12:19 PM EST**    ORIGINAL REPORT **   EXAM: MRI BRAIN WO CONTRAST       INDICATION: Stroke, follow up      TECHNIQUE:  MRI brain performed including multiplanar T1 and T2 weighted    sequences.       COMPARISON: CT 03/18/2021      FINDINGS:       Ventricles unchanged, borderline in size. No hematoma. Small microbleed    right pons. Area of 4 x 1.5 x 2 cm cortical diffusion restriction left    parieto-occipital.       Bilateral symmetric foci small and confluent areas of T2/FLAIR    hyperintensity cerebral hemisphere white matter bilaterally, thalami,    pons, midbrain with multiple  lacunar changes. Prominent perivascular    spaces throughout hemisphere white matter.        No large artery or major dural sinus flow signal abnormality. Orbits and    paranasal sinuses unremarkable. Temporal bones exhibit bilateral mastoid    fluid effusion.      IMPRESSION:       1.  Normal MRI brain without contrast.   2.  No intracranial mass, signal abnormality, or hemorrhage.            Report Verified by: Burt Ek, MD at 03/20/2021 8:41 AM EST      Final   IMPRESSION:       1.  Normal MRI brain without contrast.   2.  No intracranial mass, signal abnormality, or hemorrhage.            Report Verified by: Burt Ek, MD at 03/20/2021 8:41 AM EST      X-ray Portable Chest   Final Result   IMPRESSION:    No acute cardiopulmonary abnormality.      Approved by Vira Blanco, MD on 03/18/2021 4:41 PM EST      I have  personally reviewed the images and I agree with this report.      Report Verified by: Baird Cancer, MD at 03/18/2021 4:46 PM EST      CT Cerebral Perfusion With IV contrast   Final Result   IMPRESSION: No area of hypoperfusion or ischemia.      Report Verified by: Cleatis Polka, MD at 03/18/2021 4:13 PM EST      CT Angio Head W and or WO   Final Result   IMPRESSION:      Neck   1.  Mild atherosclerotic disease with no significant carotid or vertebral artery stenosis.   2.  No dissection or pseudoaneurysm.      Head   1.  Short segment occlusion of one of the left M2 branches in the sylvian fissure. Multifocal stenosis of this M2 branch distally.   2.  Moderate stenosis of the right M2 posterior branch.      Report Verified by: Cleatis Polka, MD at 03/18/2021 4:37 PM EST      Code Stroke-CT Head WO contrast   Final Result   IMPRESSION:      1.  Diffuse atrophy.   2.  Nonspecific white matter disease likely chronic microvascular ischemia.   3.  No intracranial hemorrhage or mass effect.         Report Verified by: Cleatis Polka, MD at 03/18/2021 3:28 PM EST      CT Angio Neck W and or WO   Final Result   IMPRESSION:      Neck   1.  Mild atherosclerotic disease with no significant carotid or  vertebral artery stenosis.   2.  No dissection or pseudoaneurysm.      Head   1.  Short segment occlusion of one of the left M2 branches in the sylvian fissure. Multifocal stenosis of this M2 branch distally.   2.  Moderate stenosis of the right M2 posterior branch.      Report Verified by: Cleatis Polka, MD at 03/18/2021 4:37 PM EST            Consulting Services (include reason)   Neuro critical care - stroke symptoms of unknown onset, admitted to NSICU    Allergies   Penicillins  Discharge Medications        Medication List        TAKE these medications, which are NEW        Quantity/Refills   clopidogreL 75 mg tablet  Commonly known as: PLAVIX  Take 1 tablet (75 mg total) by mouth daily. Indications: Cerebral Thromboembolism Prevention, For 90 days only. Discontinue after 90 days   Quantity: 30 tablet  For: Cerebral Thromboembolism Prevention, For 90 days only. Discontinue after 90 days  Refills: 2            TAKE these medications, which you were ALREADY TAKING        Quantity/Refills   ARIPiprazole 5 MG tablet  Commonly known as: ABILIFY  Take 1 tablet (5 mg total) by mouth daily.   Refills: 0     aspirin 81 MG EC tablet  Take 1 tablet (81 mg total) by mouth daily.   Quantity: 30 tablet  Refills: 0     atorvastatin 80 MG tablet  Commonly known as: LIPITOR  Take 1 tablet (80 mg total) by mouth at bedtime.   Quantity: 30 tablet  Refills: 0     cloNIDine HCL 0.2 MG tablet  Commonly known as: CATAPRES  Take 1 tablet (  0.2 mg total) by mouth 2 times a day.   Refills: 0     diltiazem 240 MG 24 hr capsule  Commonly known as: CARDIZEM CD  Take 1 capsule (240 mg total) by mouth daily.   Refills: 0     hydrALAZINE 25 MG tablet  Commonly known as: APRESOLINE  Take 1 tablet (25 mg total) by mouth in the morning and at bedtime.   Refills: 0     loratadine 10 mg Cap  Take 1 tablet by mouth daily   Refills: 0     losartan 50 MG tablet  Commonly known as: COZAAR  Take 1 tablet (50 mg total) by mouth daily.   Refills: 0      montelukast 10 mg tablet  Commonly known as: SINGULAIR  Take 1 tablet (10 mg total) by mouth at bedtime.   Refills: 0     omeprazole 40 MG capsule  Commonly known as: PRILOSEC  Take 1 capsule (40 mg total) by mouth daily.   Refills: 0     oxybutynin 10 MG 24 hr tablet  Commonly known as: DITROPAN-XL  Take 1 tablet (10 mg total) by mouth daily.   Quantity: 30 tablet  Refills: 3     oxyCODONE 5 MG immediate release tablet  Commonly known as: ROXICODONE  Take 1 tablet (5 mg total) by mouth every 4 hours as needed for Pain.   Quantity: 30 tablet  Refills: 0     tamsulosin 0.4 mg Cap  Commonly known as: FLOMAX  Take 1 capsule (0.4 mg total) by mouth at bedtime. Take 0.4 mg by mouth daily   Quantity: 30 capsule  Refills: 3     tiotropium 18 mcg  Commonly known as: SPIRIVA  Inhale 1 capsule (18 mcg total) into the lungs daily.   Quantity: 30 capsule  Refills: 11     Vitamin D3 50 mcg (2,000 unit) Cap  Generic drug: cholecalciferol (vitamin D3)  Take 1 capsule by mouth daily.   Refills: 0            STOP taking these medications      entecavir 0.5 MG tablet  Commonly known as: BARACLUDE     hydroCHLOROthiazide 12.5 mg capsule  Commonly known as: MICROZIDE     orphenadrine 100 mg tablet  Commonly known as: NORFLEX               Where to Get Your Medications        These medications were sent to [id number] - [place], [place] - [address]  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    Hospital Course     Active Hospital Problems    Diagnosis Date Noted    Cerebrovascular accident (CVA) due to occlusion of left middle cerebral  artery (CMS-HCC) [I63.512] 03/21/2021      Resolved Hospital Problems   No resolved problems to display.       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 DSA w/ only moderate stenosis was present without thombus despite no intervention, presumed to have autolysed or there was an overread of stenosis. However, etiology likely large vessel atherosclerosis however cardioembolic source cannot be excluded. Given clopidogrel load and started on DAPT - asa + plavix for intracranial atherosclerosis per SAMMPRIS criteria. Continued on atorvastatin 80mg . Pt's aphasia had improved, w/ clear speech, noted to have some perseveration. Before full stroke w/u was completed (echo not done), pt left AMA.     Stroke w/u:   - MRI w/o contrast: Small microbleed right pons. Area of 4 x 1.5 x 2 cm cortical diffusion restriction left parieto-occipital - recent Left parieto-occipital infarct.  - TTE w/ bubble - scheduled 2/22, however pt refused and requested to leave AMA. Ordered as outpatient  - Risk stratification labs; lipid profile (LDL 123), A1C - 5.8, TSH nl.   - Cardiac event monitor ordered at discharge  - Neurology follow-up and stroke nurse follow up    #HTN  On several medications outpatient.   Home regimen: Clonidine 0.2mg , Diltiazem 240mg , Hydralazine 25mg  BID, Losartan 50mg   Continued at discharge, follow up with PCP     Neurologic Examination   Pt requesting to leave AMA, exam brief and limited    Physical Exam:  General Exam: Pt in NAD, pt standing and walking around   HEENT: NCAT  CV: no LE edema  Lungs: Breathing comfortably on room air,        Neurologic Physical Exam:    Cognitive: Alert and Oriented X 4, appropriate  reasoning    Language: Speech spontaneous and fluent,no slurring of   Speech. Day prior to discharge, pt had no paraphasic errors, no slurring of speech, able to name objects, able to repeat, able to perform ???ka-pa-ta??? Had some perseveration, but able to do simple math       CN:   CN II: grossly intact   CN III/IV/VI: Extraocular movements intact   CN V: not tested  CN VII: facial motion intact and symmetric  CN VIII: Hearing intact to conversation  CN IX/X: not tested  CN XI: not tested  CN XII: not tested     Motor: Normal tone throughout, no atrophy present, muscle strength not tested separately, however pt ambulating and carrying bag by self.    Coordination/Cerebellum: No ataxia on gait    Condition on Discharge     1. Functional Status: normal     Although PT recommendation is for IPR, pt has significant improvement in functional status through hospitalization. Left AMA, although pt may not have needed IPR if PT re-evaluated after clinical improvement.      PT Interventions and Frequency: Treatment/Interventions: LE strengthening/ROM, Gait training, Neuromuscular Reeducation, Endurance training, Museum/gallery curator, Therapeutic Exercise, Therapeutic Activity  PT Frequency: minimum 3x/week   PT Recommendations: Recommendation: IP Rehab  Equipment Recommended: Defer to facility to obtain   OT Interventions and Frequency: Treatment Interventions: ADL retraining, Activity Tolerance training, IADL retraining, Patient/Family training, Functional transfer training, Therapeutic Activity  OT Frequency: minimum 3x/week   OT Recommendations: Recommendation: IP Rehab  Equipment Recommendations: Defer at this time  2. Mental Status: Alert/Oriented  3. Dietary Restrictions / Tube Feeding / TPN: No  4. Supplemental Oxygen / Ventilation / CPAP /  Bi-Level: No  5. In-dwelling Lines or Catheters: No    Disposition     Home - AMA      [name] is a 66 y.o. male admitted on 03/18/2021 for acute onset aphasia concerning for  stroke, an assessment for capacity reveals pt has decision making capacity to leave AMA     Pt orientation: self, location, month, year, situation    The below criteria are adapted from the  which can be found at http://capm.wikispaces.com/file/view/ACE.pdf    Capacity Assessment  1. Able to understand medical problem? Yes  Comments:  knows he presented w/ stroke    2. Able to understand proposed treatment? Yes  Comments:  Refused echo to w/u etiology of stroke    3. Able to understand alternative to proposed treatment (if any)? Not Applicable  Comments:  accepted outpatient completion of work up and follow up     4. Able to understanding option of refusing proposed treatment (including withholding or withdrawing proposed treatment)? Yes  Comments: none    5. Able to appreciate reasonably foreseeable consequence of accepting proposed treatment? Yes  Comments:  Discussed the risk of another stroke w/o full work-up and treatment    6. Able to appreciate reasonable foreseeable consequences of refusing proposed treatment (including withholding or withdrawing proposed treatment)? Yes  Comments: none    7a. Is the patient's decision affected by depression? No  Comments: none    7b. Is the person's decision affected by delusions/psychosis? No  Comments: none    Overall impression: Pt has capacity to make this decision     Patient is able to communicate a choice, understand the relevant information, appreciate the situation and its consequences, and reason about treatment options    Follow-Up Appointments     Future Appointments   Date Time Provider Department Center   07/23/2021 11:00 AM Burnett Harry, MD [facility] NEUR [facility]   11/02/2021  9:00 AM Ernestine Mcmurray, MD [facility] PULM [facility] HOL       Specific Follow-Up Items for Receiving Physician   Follow up with neurology and stroke nurse   - TTE ordered as outpatient   - Cardiac event monitor ordered   Follow up with PCP for HTN management      Junior Resident Physician: Ledell Noss, MD    Senior Resident Physician: Rudean Haskell, MD       Ledell Noss, MD   Internal Medicine PGY-1   Neurology rotator  2/22/20231:32 PM

## 2021-03-21 NOTE — Unmapped (Signed)
RN received a phone call from Echo lab that pt had refused testing this am and returned to room.  Pt had removed his IV, no evidence of bleeding.  RN paged MD because pt stated he was leaving.  MD to bedside with [facility] paperwork.  Pt left with all belongings.

## 2021-03-21 NOTE — Unmapped (Signed)
Plan of Care     Paged my nurse for pt refusing Echo this morning and requesting to leave. Was taken down for echo, did not let tech perform exam, was brought back to room. Saw pt this am, he's stating he has be at another hospital to pick up medication between 8:30a - 9am today. Further discussing, pt has pain medication he gets filled. Offered to call the clinic/pharmacy for pick - up later this afternoon, but pt refused. Discussed the risk of leaving AMA including increased risk of another stroke and death, pt understood the risk and is aware he is welcome to come back to seek care. AxO x 4 and has capacity to make this decision. AMA discharge summary to follow.     Ledell Noss, MD   Internal Medicine PGY-1   2/22/20238:03 AM

## 2021-03-21 NOTE — Unmapped (Signed)
Patient slept well during the night. No complaints noted. Up this am ready for discharge.

## 2021-03-21 NOTE — Unmapped (Signed)
[facility]    Case Manager/Social Worker Discharge Summary     Patient name: [name]                                        Patient MRN: [id number]  DOB: 03/14/1955                              Age: 66 y.o.              Gender: male  Patient emergency contact: Extended Emergency Contact Information  Primary Emergency Contact: Achor,Bethany (HCPOA)   [place] [employer] Phone: [phone]  Relation: Significant other  Secondary Emergency Contact: Thompson,Carolyn   [place] [employer] Phone: [phone]  Relation: Sister    Attending provider: No att. providers found  Primary care physician: Hendricks Limes DORIOTT, DO    The MD has indicated that the patient is ready for discharge.  [name] was referred and accepted at [facility] and pre-cert was started on 2/21, however patient decided to leave AMA  Transfer Mode/Level of Care: Family  The plan has been reviewed:     Patient/Family Informed of Discharge Plan: Yes    Plan Reviewed With Patient, Family, or Significant Other: Yes    Patient and or family are aware and in agreement with the discharge plan: Yes    Family Member Name and Relationship Notified at Discharge: Berlin Hun, significant other   Family Contact Number: [phone]    Plan reviewed with MD and other members of the health care team: Yes  Care Plan Completed: Yes    No further CM/SW needs.    This plan has been reviewed with the multi-disciplinary team.     Treatment Preferences    Treatment Preferences: Other (provide comment)    Post-Discharge Goals    Patient's Post-Discharge goals: return home safely    Post Acute Care Provider Information:      Advertising copywriter at Discharge  Community Services at Home post discharge: Not Applicable     Quay Burow MSN, RN CM  [phone]

## 2021-03-22 NOTE — Unmapped (Signed)
Attempted to call patient to set up event/Echo mailbox was full .

## 2021-03-26 ENCOUNTER — Ambulatory Visit: Payer: Medicare (Managed Care) | Attending: Pulmonary Disease

## 2021-03-27 NOTE — Unmapped (Signed)
Second attempt to reach patient to set up cardiac event/echo. Unable to leave message - voice mail is full.

## 2021-03-28 ENCOUNTER — Inpatient Hospital Stay
Admission: EM | Admit: 2021-03-28 | Discharge: 2021-04-06 | Disposition: A | Discharge status: Rehabilitation Facility | Payer: Medicare (Managed Care) | Admitting: Neurology

## 2021-03-28 ENCOUNTER — Emergency Department: Admit: 2021-03-28 | Payer: Medicare (Managed Care)

## 2021-03-28 ENCOUNTER — Encounter

## 2021-03-28 ENCOUNTER — Emergency Department: Admit: 2021-03-28 | Discharge: 2021-04-02 | Payer: Medicare (Managed Care) | Attending: Neurology

## 2021-03-28 LAB — BASIC METABOLIC PANEL
Anion Gap: 4 mmol/L (ref 3–16)
BUN: 18 mg/dL (ref 7–25)
CO2: 26 mmol/L (ref 21–33)
Calcium: 8.6 mg/dL (ref 8.6–10.3)
Chloride: 104 mmol/L (ref 98–110)
Creatinine: 1.21 mg/dL (ref 0.60–1.30)
EGFR: 66
Glucose: 103 mg/dL (ref 70–100)
Osmolality, Calculated: 280 mOsm/kg (ref 278–305)
Sodium: 134 mmol/L (ref 133–146)

## 2021-03-28 LAB — POC GLU MONITORING DEVICE: POC Glucose Monitoring Device: 117 mg/dL — ABNORMAL HIGH (ref 70–100)

## 2021-03-28 LAB — DIFFERENTIAL
Basophils Absolute: 40 /uL (ref 0–200)
Basophils Relative: 0.6 % (ref 0.0–1.0)
Eosinophils Absolute: 114 /uL (ref 15–500)
Eosinophils Relative: 1.7 % (ref 0.0–8.0)
Lymphocytes Absolute: 1930 /uL (ref 850–3900)
Lymphocytes Relative: 28.8 % (ref 15.0–45.0)
Monocytes Absolute: 496 /uL (ref 200–950)
Monocytes Relative: 7.4 % (ref 0.0–12.0)
Neutrophils Absolute: 4121 /uL (ref 1500–7800)
Neutrophils Relative: 61.5 % (ref 40.0–80.0)
nRBC: 0 /100 WBC (ref 0–0)

## 2021-03-28 LAB — CBC
Hematocrit: 40.1 % (ref 38.5–50.0)
Hemoglobin: 13.6 g/dL (ref 13.2–17.1)
MCH: 31.2 pg (ref 27.0–33.0)
MCHC: 33.9 g/dL (ref 32.0–36.0)
MCV: 92.1 fL (ref 80.0–100.0)
MPV: 8.5 fL (ref 7.5–11.5)
Platelets: 230 10*3/uL (ref 140–400)
RBC: 4.35 10E6/uL (ref 4.20–5.80)
RDW: 14.3 % (ref 11.0–15.0)
WBC: 6.7 10E3/uL (ref 3.8–10.8)

## 2021-03-28 LAB — ANTIBODY SCREEN: Antibody Screen: NEGATIVE

## 2021-03-28 LAB — POTASSIUM: Potassium: 4 mmol/L (ref 3.5–5.3)

## 2021-03-28 LAB — APTT: aPTT: 28.2 seconds (ref 25.5–35.0)

## 2021-03-28 LAB — PROTIME-INR
INR: 1 (ref 0.9–1.1)
Protime: 13.5 seconds (ref 12.1–15.1)

## 2021-03-28 LAB — ABO/RH: Rh Type: POSITIVE

## 2021-03-28 MED ORDER — midazolam (PF) (VERSED) injection
1 | INTRAMUSCULAR | Status: AC | PRN
Start: 2021-03-28 — End: 2021-03-28
  Administered 2021-03-28: 17:00:00 .5 via INTRAVENOUS
  Administered 2021-03-28: 17:00:00 1 via INTRAVENOUS

## 2021-03-28 MED ORDER — hydrALAZINE (APRESOLINE) 20 mg/mL injection
20 | INTRAMUSCULAR | Status: AC | PRN
Start: 2021-03-28 — End: 2021-03-28
  Administered 2021-03-28: 17:00:00 5 via INTRAVENOUS
  Administered 2021-03-28: 17:00:00 15 via INTRAVENOUS

## 2021-03-28 MED ORDER — OMNIPAQUE (iohexol) 350 mg iodine/mL 50 mL
350 | Freq: Once | INTRAVENOUS | Status: AC | PRN
Start: 2021-03-28 — End: 2021-03-28
  Administered 2021-03-28: 16:00:00 50 mL via INTRAVENOUS

## 2021-03-28 MED ORDER — heparin (porcine) injection 5,000 Units
5000 | Freq: Three times a day (TID) | INTRAMUSCULAR | Status: AC
Start: 2021-03-28 — End: 2021-04-06
  Administered 2021-03-29 – 2021-04-06 (×11): 5000 [IU] via SUBCUTANEOUS

## 2021-03-28 MED ORDER — OMNIPAQUE 300 mg/mL (iohexol) 100 mL
300 | Freq: Once | INTRAVENOUS | Status: AC | PRN
Start: 2021-03-28 — End: 2021-03-28
  Administered 2021-03-28: 18:00:00 80 mL

## 2021-03-28 MED ORDER — midazolam (PF) (VERSED) 1 mg/mL injection
1 | INTRAMUSCULAR | Status: AC
Start: 2021-03-28 — End: 2021-03-28

## 2021-03-28 MED ORDER — atorvastatin (LIPITOR) tablet 80 mg
80 | Freq: Every evening | ORAL
Start: 2021-03-28 — End: 2021-04-06
  Administered 2021-03-31 – 2021-04-06 (×7): 80 mg via ORAL

## 2021-03-28 MED ORDER — OMNIPAQUE (iohexol) 350 mg iodine/mL 100 mL
350 | Freq: Once | INTRAVENOUS | Status: AC | PRN
Start: 2021-03-28 — End: 2021-03-28
  Administered 2021-03-28: 15:00:00 80 mL via INTRAVENOUS

## 2021-03-28 MED ORDER — QUEtiapine (SEROQUEL) tablet 25 mg
25 | Freq: Once | ORAL | Status: AC | PRN
Start: 2021-03-28 — End: 2021-03-28

## 2021-03-28 MED ORDER — aspirin chewable tablet 81 mg
81 | Freq: Every day | ORAL | Status: AC
Start: 2021-03-28 — End: 2021-04-06
  Administered 2021-03-29 – 2021-04-06 (×8): 81 mg via ORAL

## 2021-03-28 MED ORDER — fentaNYL (SUBLIMAZE) injection
50 | INTRAMUSCULAR | Status: AC | PRN
Start: 2021-03-28 — End: 2021-03-28
  Administered 2021-03-28: 17:00:00 50 via INTRAVENOUS
  Administered 2021-03-28: 17:00:00 25 via INTRAVENOUS

## 2021-03-28 MED ORDER — QUEtiapine (SEROQUEL) tablet 25 mg
50 | Freq: Every evening | ORAL | Status: AC
Start: 2021-03-28 — End: 2021-03-28

## 2021-03-28 MED ORDER — ondansetron (ZOFRAN) injection 4 mg
4 | Freq: Four times a day (QID) | INTRAMUSCULAR | PRN
Start: 2021-03-28 — End: 2021-04-06

## 2021-03-28 MED ORDER — hydrALAZINE (APRESOLINE) 20 mg/mL injection
20 | INTRAMUSCULAR | Status: AC
Start: 2021-03-28 — End: 2021-03-29

## 2021-03-28 MED ORDER — lidocaine 10 mg/mL (1 %) injection
10 | INTRAMUSCULAR | Status: AC
Start: 2021-03-28 — End: 2021-03-28

## 2021-03-28 MED ORDER — fentaNYL (SUBLIMAZE) 50 mcg/mL injection
50 | INTRAMUSCULAR | Status: AC
Start: 2021-03-28 — End: ?

## 2021-03-28 MED ORDER — tamsulosin (FLOMAX) capsule 0.4 mg
0.4 | Freq: Every evening | ORAL
Start: 2021-03-28 — End: 2021-04-06
  Administered 2021-03-31 – 2021-04-06 (×7): 0.4 mg via ORAL

## 2021-03-28 MED ORDER — lidocaine 10 mg/mL (1 %) injection
10 | INTRAMUSCULAR | Status: AC | PRN
Start: 2021-03-28 — End: 2021-03-28
  Administered 2021-03-28: 17:00:00 10 via SUBCUTANEOUS

## 2021-03-28 MED ORDER — labetaloL (NORMODYNE) injection 10 mg
5 | INTRAVENOUS | PRN
Start: 2021-03-28 — End: 2021-03-29

## 2021-03-28 MED ORDER — clopidogreL (PLAVIX) tablet 75 mg
75 | Freq: Every day | ORAL
Start: 2021-03-28 — End: 2021-04-06
  Administered 2021-03-29 – 2021-04-06 (×6): 75 mg via ORAL

## 2021-03-28 MED FILL — HEPARIN (PORCINE) 5,000 UNIT/ML INJECTION SOLUTION: 5000 5,000 unit/mL | INTRAMUSCULAR | Qty: 1

## 2021-03-28 MED FILL — FENTANYL (PF) 50 MCG/ML INJECTION SOLUTION: 50 50 mcg/mL | INTRAMUSCULAR | Qty: 2

## 2021-03-28 MED FILL — MIDAZOLAM (PF) 1 MG/ML INJECTION SOLUTION: 1 1 mg/mL | INTRAMUSCULAR | Qty: 2

## 2021-03-28 MED FILL — OMNIPAQUE 350 MG IODINE/ML INTRAVENOUS SOLUTION: 350 350 mg iodine/mL | INTRAVENOUS | Qty: 50

## 2021-03-28 MED FILL — OMNIPAQUE 350 MG IODINE/ML INTRAVENOUS SOLUTION: 350 350 mg iodine/mL | INTRAVENOUS | Qty: 100

## 2021-03-28 MED FILL — HYDRALAZINE 20 MG/ML INJECTION SOLUTION: 20 20 mg/mL | INTRAMUSCULAR | Qty: 1

## 2021-03-28 MED FILL — OMNIPAQUE 300 MG IODINE/ML INTRAVENOUS SOLUTION: 300 300 mg iodine/mL | INTRAVENOUS | Qty: 100

## 2021-03-28 MED FILL — LIDOCAINE HCL 10 MG/ML (1 %) INJECTION SOLUTION: 10 10 mg/mL (1 %) | INTRAMUSCULAR | Qty: 20

## 2021-03-28 NOTE — Unmapped (Signed)
Confused conversation.  [id number]

## 2021-03-28 NOTE — Unmapped (Signed)
Pre-Procedure Patient History / Assessment    [name]  MRN: [id number]  DOB: [birth year]    See PCP's paper / EPIC H&P    Allergies   Allergen Reactions   ??? Penicillins Hives       ASA Physical Status Classification System:  ASA 2     NPO since: unknown, LSN 2300 2/28    Review of Nursing History/Assessment:  Yes    Consent: I discussed risks, benefits and alternatives of procedure with patient/patients family and they would like to proceed.     H&P Reviewed:  Yes    Medical History:  Past Medical History:   Diagnosis Date   ??? COPD (chronic obstructive pulmonary disease) (CMS-HCC)    ??? Diabetes mellitus (CMS-HCC)    ??? Dysphagia    ??? GERD (gastroesophageal reflux disease)    ??? Hematuria    ??? Hepatitis    ??? Hypertension        Surgical History:  Past Surgical History:   Procedure Laterality Date   ??? ESOPHAGOGASTRODUODENOSCOPY N/A 11/19/2013    Procedure: ESOPHAGOGASTRODUODENOSCOPY WITH MAC;  Surgeon: Charlane Ferretti, MD;  Location: [facility] ENDOSCOPY;  Service: Gastroenterology;  Laterality: N/A;   ??? FOOT SURGERY Left    ??? HERNIA REPAIR      about 5 yrs ago       Prior Anesthesia History:          Same as above     Any difficulties:  No    Airway Assessment:  within normal limits    Mallampati Classification:  II    Relevant diagnostic tests and images reviewed:   Yes    Medication Reconciliation Reviewed:  Yes     Sedation plan (type/medications):    Plan: Cerebral angiography and possible thrombectomy      Sedation with Fentanyl / Versed     Bunnie Pion, PA-C  [facility] Department of Neurosurgery  03/28/2021  11:20 AM

## 2021-03-28 NOTE — Unmapped (Signed)
Interventional Neuro-Radiology  Brief Post-Angio Note    Brief HPI: [name] is a 66 y.o. male with concern for a new L-MCA occlusion. LSN at 2300 on 2/28. NIHSS 6.     Indication: L-MCA syndrome     Procedure:   - diagnostic cerebral angiogram   - R femoral artery closure with 71F Angioseal     Contrast Used:  120 ml Visipaque    Findings:  - Distal left M2 occlusion with severe stenosis   - No thrombectomy or angioplasty performed     A detailed report will follow separately (see Procedures)    Estimated Blood Loss:  minimal    Complications:  None     Post-procedure Exam (unchanged from prior):     Groin: R groin site c/d/i, with no e/o groin hematoma.  Distal pulses intact    Recommendations:   - R leg flat x 2 hours.    - Call numbers below with any post-procedural complications  - See Post-Angio orders      Bunnie Pion, PA-C  [facility] Department of Neurosurgery  03/28/2021  12:30 PM        If unable to reach, contact Neuro-IR on call.

## 2021-03-28 NOTE — Unmapped (Signed)
This RN received bedside report from [facility] s/p cerebral angiogram at approximately 1245. Pt agitated, using closed hands to swing at staff,  unable to be redirected. This RN applied bilateral soft wrist restraints and LLE soft ankle restraint. IR Charge nurse Iona Hansen notified Dr. Luther Bradley to obtain restraint orders. Restraint orders received.     Pt's significant other notified at beside in post-procedure IR. Significant other verbalized understanding for restraints to reduce self-harm of patient and staff.     Care plan initiated.

## 2021-03-28 NOTE — Unmapped (Addendum)
Brief neurology Note -Stroke response Note     [name] is a 66 yo with PMH of HTN, T2DM and recent Left MCA stroke 2/2 left M2 occlusion (03/18/2021) who was admitted to the inpatient service and left AMA on 03/21/2021. He underwent a DSA and it showed severe stenosis of the left M2 and no thrombectomy was performed as the occlusion resolved without intervention. He was put on DAPT, but unfortunately, he left AMA and was not taking his medications.   Today, he presented to the hospital after his daugther found him with speech difficulty worse than his baseline. LWK: 10-11 pm when he went to sleep and was found in the morning with aphasia.   In the ED, he presented with an NIHSS: 6 and his CTA showed an acute Left M2 occlusion (Worse than his previous CTA obtained in February that showed severe stenosis). He underwent a CTP which showed a mismatch ratio of 2.9 in the left MCA territory.   The IR team was contacted and he will undergo EVT.       NIH Stroke Scale                                    03/28/2021                                                 11:03 AM    1a  Level of consciousness: 0=not alert but arousable by minor stimulation to obey, answer or respond   1b. LOC questions:  2=Performs neither task correctly   1c. LOC commands: 1=Performs one task correctly   2.  Best Gaze: 0=normal   3.  Visual: 0=No visual loss   4. Facial Palsy: 1=Minor paralysis (flattened nasolabial fold, asymmetric on smiling)   5a.  Motor left arm: 0=No drift, limb holds 90 (or 45) degrees for full 10 seconds   5b.  Motor right arm: 0=No drift, limb holds 90 (or 45) degrees for full 10 seconds   6a. motor left leg: 0=No drift, limb holds 90 (or 45) degrees for full 10 seconds   6b  Motor right leg:  0=No drift, limb holds 90 (or 45) degrees for full 10 seconds   7. Limb Ataxia: 0=Absent   8.  Sensory: 0=Normal; no sensory loss   9. Best Language:  2=Severe aphasia   10. Dysarthria: 0=Normal   11. Extinction and Inattention: 0=No  abnormality   12. Distal motor function: 0=Normal    Total:   6     03/28/2021               11:03 AM    Louanne Belton, MD.  Neurology Resident, PGY-3    Selina Cooley M.D MPH   PGY 5 Vascular Neurology Fellow

## 2021-03-28 NOTE — Unmapped (Signed)
Patient confused and agitated,. Unable to follow directions & commands. Unable to perform nursing swallow assessment due to patient current AMS, patient remains NPO. MD made aware of medications not given and patient's current status.

## 2021-03-28 NOTE — Unmapped (Signed)
[facility]  Department of Neurology  Inpatient History and Physical    History Present of Illness:     CC: [name] is a 66 y.o. M with PMH L MCA strokes (most recent 03/18/21), HTN, and T2DM, who presented with acute onset of recurrent expressive aphasia. He was admitted to the neurology inpatient service for acute ischemic stroke.     HPI:   Briefly, he has a history of L MCA strokes, including L M3 occlusion s/p tPA in 2017, and was recently admitted with acute onset of aphasia on 03/18/21 and found to have L M2 occlusion. He was outside the temporal window for thrombolysis and had no changes on CTP, so endovascular therapy was not pursued. He was started on DAPT per [facility] due to suspected etiology of large vessel atherosclerosis (hihg-grade stenosis on DSA), but unfortunately left before echocardiogram could be completed. Per documentation, his speech had significantly improved and was clear with some preservation.    He returns today with recurrent expressive aphasia on waking up at approximately 0800-0830 AM, LKN ~2300-2330 on 03/27/21 when he went to bed. Initial NIHSS 6. CT showed evolving L MCA infarct similar to prior; CTA showed left M2 occlusion. CTP showed 25 mL core and 48 mL penumbra. DSA showed distal L M2 occlusion with severe stenosis; thrombectomy could not be performed because MCA was so stenotic that clot could not be reached. Course otherwise complicated by agitation requiring restraints. Had been swinging at nursing staff.    ROS: A 10-pt ROS was performed and otherwise negative.    Medications:     Medications:  Current Outpatient Medications   Medication Instructions   ??? ARIPiprazole (ABILIFY) 5 mg, Oral, Daily   ??? aspirin 81 mg, Oral, Daily   ??? atorvastatin (LIPITOR) 80 mg, Oral, At Bedtime (2100)   ??? cholecalciferol, vitamin D3, (VITAMIN D3) 50 mcg (2,000 unit) Cap 1 capsule, Oral, Daily   ??? cloNIDine HCL (CATAPRES) 0.2 mg, Oral, 2 times daily   ???  clopidogreL (PLAVIX) 75 mg, Oral, Daily   ??? diltiazem (CARDIZEM CD) 240 mg, Oral, Daily   ??? hydrALAZINE (APRESOLINE) 25 mg, Oral, 2 times daily   ??? loratadine 10 mg Cap Take 1 tablet by mouth daily   ??? losartan (COZAAR) 50 mg, Oral, Daily   ??? montelukast (SINGULAIR) 10 mg, Oral, At Bedtime (2100)   ??? omeprazole (PRILOSEC) 40 mg, Oral, Daily   ??? oxybutynin (DITROPAN-XL) 10 mg, Oral, Daily   ??? oxyCODONE (ROXICODONE) 5 mg, Oral, Every 4 hours PRN   ??? tamsulosin (FLOMAX) 0.4 mg, Oral, At Bedtime (2100), Take 0.4 mg by mouth daily   ??? tiotropium (SPIRIVA) 18 mcg, Inhalation, Daily     Allergies   Allergen Reactions   ??? Penicillins Hives       Historical Data:     Past medical/surgical history:  Past Medical History:   Diagnosis Date   ??? COPD (chronic obstructive pulmonary disease) (CMS-HCC)    ??? Diabetes mellitus (CMS-HCC)    ??? Dysphagia    ??? GERD (gastroesophageal reflux disease)    ??? Hematuria    ??? Hepatitis    ??? Hypertension       Past Surgical History:   Procedure Laterality Date   ??? ESOPHAGOGASTRODUODENOSCOPY N/A 11/19/2013    Procedure: ESOPHAGOGASTRODUODENOSCOPY WITH MAC;  Surgeon: Charlane Ferretti, MD;  Location: [facility] ENDOSCOPY;  Service: Gastroenterology;  Laterality: N/A;   ??? FOOT SURGERY Left    ??? HERNIA REPAIR  about 5 yrs ago     Family history:  History reviewed. No pertinent family history.  Social history:  Social History     Tobacco Use   ??? Smoking status: Every Day     Packs/day: 0.25     Years: 5.00     Pack years: 1.25     Types: Cigarettes   ??? Smokeless tobacco: Never   Substance Use Topics   ??? Alcohol use: No   ??? Drug use: No       Physical Exam:   Heart Rate:  [70-99] 99  Resp:  [8-20] 18  BP: (160-221)/(78-199) 185/92    General medical exam:  ??? General: Alert, well-appearing; normal body habitus.  ??? Eyes: No scleral icterus or conjunctival injection.   ??? CV: Regular rate; regular rhythm; no m/r/g; no extremity edema.  ??? Pulm: Symmetric chest rise; normal work of breathing.  ??? Abdomen: Soft;  non-tender to palpation; no mass/organomegaly.  ??? Musculoskeletal: No gross joint deformities. ROM grossly normal.  ??? Psychiatric: Agitated, pulling at restraints. No obvious response to internal stimuli.    Mental status exam:  ??? Alertness: Opens eyes to voice.  ??? Orientation: Unable to answer orientation questions.  ??? Language: Does not clearly follow commands. Questionable thumbs up but does not follow any other commands. Some stereotyped/perseverative phrases sporadically, but no meaningful sentences.  ??? Memory: Unable to provide any history independently.   ??? Attention: Attends well to exam.  ??? Fund of knowledge: Presumed to be abnormal at this time.    Cranial nerve exam:  ??? CN II: Visual fields grossly full but difficult to assess.  ??? CN III/IV/VI: Extraocular movements grossly intact tracking examiner around room, buries sclera on bilateral abduction; difficult to assess vertical movements; PERRL. No nystagmus.  ??? CN V: Facial sensation symmetric and intact to light touch.  ??? CN VII: Face with minor R facial droop.  ??? CN VIII: Hearing intact to voice.  ??? CN IX/X: Palate elevation symmetric; speech non-dysarthric.  ??? CN XI: Shoulder elevation 5/5 power.  ??? CN XII: Tongue midline on protrusion.    Motor exam:  Moves all extremities spontaneously and antigravity, possibly using RUE slightly less. Does not persistently hold arms or legs in the air, but resists strongly against restraints.    Sensory exam:  Does not withdrawal right arm quickly from nailbed pressure but eventually does withdrawal, likely relative sensory deficit. Otherwise appears intact in 4/4 extremities.    Coordination:  Patient restrained and not following commands, but no obvious ataxia.  No abnormal movements or dyskinesias.    Gait: Impaired.    NIH Stroke Scale:  1a  Level of consciousness: 0=alert; keenly responsive   1b. LOC questions:  2=Performs neither task correctly   1c. LOC commands: 2=Performs neither task correctly   2.  Best  Gaze: 0=normal   3.  Visual: 0=No visual loss   4. Facial Palsy: 1=Minor paralysis (flattened nasolabial fold, asymmetric on smiling)   5a.  Motor left arm: 0=No drift, limb holds 90 (or 45) degrees for full 10 seconds   5b.  Motor right arm: 0=No drift, limb holds 90 (or 45) degrees for full 10 seconds   6a. motor left leg: 0=No drift, limb holds 90 (or 45) degrees for full 10 seconds   6b  Motor right leg:  0=No drift, limb holds 90 (or 45) degrees for full 10 seconds   7. Limb Ataxia: 0=Absent   8.  Sensory: 1=Mild to  moderate sensory loss; patient feels pinprick is less sharp or is dull on the affected side; there is a loss of superficial pain with pinprick but the patient is aware of being touched   9. Best Language:  2=Severe aphasia   10. Dysarthria: 0=Normal   11. Extinction and Inattention: 0=No abnormality    Total:   8     Diagnostic Studies:   I have personally reviewed all labs and imaging available in the EMR.   Pertinent findings if present documented below or in A/P.    Assessment/Plan:     [name] is a 66 y.o. M with PMH L MCA strokes, admitted for acute ischemic stroke.    # Acute ischemic stroke due to left MCA (M2) occlusion  ?? Sx: Woke up with acute onset of aphasia at approx. 0800 on 03/28/21, LKN approx. 2300 on 03/27/21. Initial NIHSS 6.  ?? CT: Evolution of stroke in similar territory to prior from 03/18/21, L MCA.  ?? CTA: Left M2 occlusion.  ?? CTP: 25 mL ischemic core and 48 mL penumbra  ?? Basic stroke risk labs: A1C 5.8% (03/20/2021); TSH 1.03 (03/20/2021).    Suspect etiology is severe atherosclerotic disease; he presents with a third ischemic stroke in left MCA territory with highly stenotic MCA, even preventing access to occlusion during angiography. DDx would also include cardioembolic etiologies; unable to complete TTE during recent admission.  -- Resume home ASA 81 mg and clopidogrel 75 mg daily  -- Home atorvastatin 80 mg qHS  -- BP autoregulation x 24H (end 03/29/21 at 0830); max  220/110, PRN labetalol  -- TTE/bubble study  -- PT/OT/SLP evaluation; bedside swallow test in the meantime  -- Telemetry, neuro checks  -- Check lipids, previously hypercholesterolemic  -- Agitation: quetiapine 25 mg PRN (spot-dose); requiring restraints.   -- Consider restarting home aripiprazole 5 mg daily (although last dose unknown)  -- Likely will require cardiac monitor on discharge    # Hypertension  Has prescriptions for clonidine 0.2 mg BID, diltiazem 240 mg daily, hydralazine 25 mg BID, losartan 50 mg daily. However, unclear when these were last taken in discussion with family, although fill history appears mostly up to date.  -- BP autoregulation until 0830 on 03/29/21 as above  -- Renal function appears near normal, likely restart ARB first  -- Consider switching to first line antihypertensives with less rebound-risk    Chronic/inactive/resolved problems:  # Type 2 diabetes mellitus: Last A1C at goal. LDSSI.    Inpatient Management:   1. Diet: NPO pending BSS/SLP  2. IV fluids: none  3. Lines: PIV  4. Telemetry: yes           5. ASA/Statin: yes        6. Pain management: APAP  7. DVT prophylaxis: SCH  8. Stress ulcer prophylaxis: not indicated  9. Precautions/Isolation: none   10. Restraints: none  11. Toileting:  ? Last BM: not documented  ? Foley Catheter: not indicated  12. Ambulation: as tolerated  13. Ancillary services: PT/OT/SLP/SW  14. Family Updated Last: this afternoon      Signed:  Melida Gimenez, MD  Neurology Resident

## 2021-03-28 NOTE — Unmapped (Signed)
Patient to IR suite for Angiogram/Thrombectomy  placed supine on procedure table, cardiac monitor attached and draped in sterile fashion. NIH is 6 upon arrival to IR procedure to be performed by [facility] status Alert disoriented to self, time, situation, Pedal pulses 2+ via palpation

## 2021-03-28 NOTE — Unmapped (Signed)
[facility] ED Note    Patient History     HPI: [name] is a 66 y.o. male with past medical history prior L MCA stroke (L M3 occlusion s/p tPA in 2017), HTN, DMII, R parotid mass awaiting excision/bx who presents to the emergency department for concerns of altered speech.  Patient is accompanied by fianc?? who helps with the history given the patient's aphasia.  She notes that the patient recently left the hospital.  He went to bed last night around 11-1128, and upon awakening this morning around 8-830 was noticed to have a difficulty with speaking.  Prior to this morning, the patient had been at his normal baseline.    On chart review, the patient left AGAINST MEDICAL ADVICE after finding an M2 occlusion.  He had been started on Plavix, however wife notes the patient has not been taking the medication, given he left AGAINST MEDICAL ADVICE.  He denies any fever for him.  Discharge summary notes that the patient's speech had improved to baseline, however aside from some mild perseveration..    Past Medical History:   Diagnosis Date   ??? COPD (chronic obstructive pulmonary disease) (CMS-HCC)    ??? Diabetes mellitus (CMS-HCC)    ??? Dysphagia    ??? GERD (gastroesophageal reflux disease)    ??? Hematuria    ??? Hepatitis    ??? Hypertension        Physical Exam     ED Triage Vitals   Vital Signs Group      Temp       Temp src       Pulse       Heart Rate Source       Resp       SpO2       BP       MAP (mmHg)       BP Location       BP Method       Patient Position    SpO2    O2 Device      Vitals:    03/28/21 1215 03/28/21 1220 03/28/21 1225 03/28/21 1245   BP: (!) 218/130 (!) 221/199 (!) 162/106 (!) 162/94   Pulse: 73 70 78 76   Resp: 12 15 14 20    SpO2:    99%      Temp Readings from Last 2 Encounters:   03/21/21 98.3 ??F (36.8 ??C) (Oral)   02/05/21 97.4 ??F (36.3 ??C) (Oral)     BP Readings from Last 2 Encounters:   03/28/21 (!) 162/94   03/21/21 132/74     Pulse Readings from Last 2 Encounters:   03/28/21 76   03/21/21 72         Physical Exam  General: awake, alert, NAD  HEENT: NCAT, no nasal discharge; EOMI, vision grossly intact  Cardiac: Regular rate and rhythm w/o MRG  Resp: normal respiratory effort; CTAB  Abdomen: soft and non-tender, non-distended, without rebound or guarding  Extremities: no CCE; bilateral 2+ pulses (radial and doralis pedis)  Skin: without rashes or erythema  Neuro: Spontaneous movement of upper and lower extremities; intermittent compliance with NIHSS scale; no facial droop appreciated; fluid speech pattern; possible expressive aphasia    Diagnostic Studies   Labs:    Labs Reviewed   BASIC METABOLIC PANEL - Abnormal; Notable for the following components:       Result Value    Glucose 103 (*)     All other components within  normal limits   POC GLU MONITORING DEVICE - Abnormal; Notable for the following components:    POC Glucose Monitoring Device 117 (*)     All other components within normal limits   POTASSIUM   PROTIME-INR   APTT   CBC   DIFFERENTIAL   ABO/RH   ANTIBODY SCREEN    Narrative:     Testing performed by [facility]       Radiology:  CODE STROKE - CT HEAD WITHOUT CONTRAST  CT ANGIO NECK W AND OR WO CONTRAST  CT ANGIOGRAPHY HEAD W &/OR WO WITH VIZ LVO  CT CEREBRAL PERFUSION WITH IV CONTRAST  MRI HEAD WITHOUT CONTRAST  ED CONTACT PROVIDER [facility]  VITAL SIGNS  NURSING PAIN ASSESSMENT  POST PROCEDURE SITE ASSESSMENT  BEDREST AFTER SHEATH REMOVAL  ELEVATE HEAD OF BED 30 DEGREES  CMU REMOTE CARDIAC MONITOR 48H  CMU REMOTE CONTINUOUS PULSE OX  NEUROLOGY STANDARDS OF CARE  PROVIDE EDUCATIONAL MATERIAL  NURSING DYSPHAGIA SCREEN  ACTIVITY AS TOLERATED  APPLY SEQUENTIAL COMPRESSION DEVICE  PULSE CHECKS  FALL PRECAUTIONS  IR INTERNAL CAROTID LT INTRACRAN W ANG  FULL CODE  INSERT PERIPHERAL IV  PT EVAL AND TREAT  OT EVAL AND TREAT  SLP EVAL AND TREAT  ADMIT TO INPATIENT  DIET NOTHING BY MOUTH  RESTRAINTS NON-VIOLENT OR NON-SELF DESTRUCTIVE  VITAL SIGNS  INTAKE AND OUTPUT  NEURO CHECKS  CT Cerebral  Perfusion With IV contrast   Final Result   IMPRESSION:      1.  moderate area of hypoperfusion in the left MCA territory with a central ischemic core of 25 mL and a 48 mL penumbra.         Report Verified by: Alfredo Bach, DO at 03/28/2021 11:13 AM EST      Code Stroke-CT Head WO contrast   Final Result   IMPRESSION:      1.  Evolving left MCA distribution infarct in the general region of diffusion restriction on MRI 03/19/2021. If there is concern for progressive infarction, MRI would be more sensitive for further evaluation.   2.  No intracranial mass effect or definite hemorrhage.   3.  Chronic white matter disease and remote left basal ganglia lacunar infarct.      Report Verified by: Veto Kemps, MD at 03/28/2021 10:02 AM EST      CT Angio Neck W and or WO   Final Result   IMPRESSION:      Neck   1.  No acute arterial abnormality      Head   1.  No acute arterial abnormality      Report Verified by: Leda Roys, MD at 03/28/2021 10:38 AM EST      CT Angio Head W and or WO WITH VIZ LVO   Final Result   IMPRESSION:      Neck   1.  No acute arterial abnormality      Head   1.  No acute arterial abnormality      Report Verified by: Leda Roys, MD at 03/28/2021 10:38 AM EST      MRI Head WO contrast    (Results Pending)         ED Course and MDM   Medical Decision Making  Amount and/or Complexity of Data Reviewed  Labs: ordered.  Radiology: ordered. Decision-making details documented in ED Course.      Risk  Decision regarding hospitalization.      Problem: The patient had an  acute problem/illness that posed a threat to life or bodily function.    The patient presented for concern of altered mental status.  Patient does have a recent stroke, and does present with strokelike symptoms.  The patient was noted to have left prior to hospital completion, and subsequently does have significant concern for new/worsening pathology related to neurovascular disease.  Patient received a CT head, CTA prior to  evaluation.  Stroke team was consulted, who recommended CT perfusion.  For the patient's recent stroke, he is not a TNK candidate.    ED Course as of 03/28/21 1401   Wed Mar 28, 2021   1052 Concern by stroke team for worsening occlusion, subsequently plan to obtain CT PE, and if favorable, possible EVT.   1122 Code Stroke-CT Head WO contrast  1. ??Evolving left MCA distribution infarct in the general region of diffusion restriction on MRI 03/19/2021. If there is concern for progressive infarction, MRI would be more sensitive for further evaluation.   2. ??No intracranial mass effect or definite hemorrhage.   3. ??Chronic white matter disease and remote left basal ganglia lacunar infarct.    1122 CT Cerebral Perfusion With IV contrast  Moderate area of hypoperfusion in the left MCA territory with a central ischemic core of 25 mL and a 48 mL penumbra.      Patient ultimately took into IR by neuro interventional surgery.  There he was found to have distal left M2 occlusion with severe stenosis, without thrombectomy or angioplasty performed.    Data:  ??? External data reviewed: Yes. Please see HPI and/or MDM.  ??? Labs: Ordered and reviewed Details noted in diagnostic studies, documented/discussed in MDM.  ??? Independent historian used for aid with patient's care: Family member. Please see HPI and/or MDM.  ??? I independently interpreted: n/a Details documented in MDM.  ??? I discussed the patient's care with: Consultant - see MDM for full details .     Meds given in ED or prescribed for discharge:  Medications   lidocaine 10 mg/mL (1 %) injection (has no administration in time range)   midazolam (PF) (VERSED) 1 mg/mL injection (has no administration in time range)   hydrALAZINE (APRESOLINE) 20 mg/mL injection (has no administration in time range)   ondansetron (ZOFRAN) injection 4 mg (has no administration in time range)   OMNIPAQUE (iohexol) 350 mg iodine/mL 100 mL (80 mLs Intravenous Given 03/28/21 0949)   OMNIPAQUE (iohexol) 350  mg iodine/mL 50 mL (50 mLs Intravenous Given 03/28/21 1037)   lidocaine 10 mg/mL (1 %) injection (10 mLs Subcutaneous Given 03/28/21 1148)   fentaNYL (SUBLIMAZE) injection (25 mcg Intravenous Given 03/28/21 1211)   midazolam (PF) (VERSED) injection (0.5 mg Intravenous Given 03/28/21 1211)   hydrALAZINE (APRESOLINE) 20 mg/mL injection (15 mg Intravenous Given 03/28/21 1224)   OMNIPAQUE 300 mg/mL (iohexol) 100 mL (80 mLs Other Given 03/28/21 1244)       Emergency Department Discharge Planning     Disposition:  IR    Patient Referred to:    No follow-up provider specified.    Future Appointments   Date Time Provider Department Center   03/28/2021  7:00 PM [facility] 1 [facility]   07/23/2021 11:00 AM Burnett Harry, MD [facility] NEUR [facility]   11/02/2021  9:00 AM Ernestine Mcmurray, MD [name] PULM [facility] HOL       Critical Care Time (Attendings)   CRITICAL CARE TIME    I have seen and examined this patient  and provided 35 minutes of critical care time exclusive of separately billed procedures and treating other patients.  Critical care was necessary to treat or prevent imminent or life threatening deterioration of the following condition(s): CNS failure or compromise    Critical care time was spent personally by me on the following activities: discussions with consultants, evaluation of patient's resonse to treatment, ordering and review of laboratory studies, ordering and review of radiographic studies and re-evaluation of patient's condition.     Rudene Anda, MD  03/28/21 1401

## 2021-03-28 NOTE — Unmapped (Signed)
CVA 03/21/20.  DC'd from UC with order to go home and wait for a call from [facility] for rehab. Patient left before prescriptions given. Expressive aphasia at triage.

## 2021-03-28 NOTE — Unmapped (Signed)
Problem: Non-violent, non-self-destructive restraints  Description: Less restrictive alternative interventions will be considered prior to application of restraint devices.  Goal: Patient will be restrained only to maintain safety  Description: Alternatives to restraints can include; moving the patient closer to the nurses station, video monitoring where available, medicating for pain, agitation or psychosis, psychosocial interventions, manipulation of environment, frequent toileting, diversional activities, bed alarms, having family members sit with patient, frequent reorientation, or repositioning.  Outcome: Not Progressing     Problem: Non-violent restraint safety  Goal: Patient will be free from injury during restraint period  Description: Assess and monitor for unsafe behaviors that can include unintentional harm of self or others, risk for joint dislocation related to post-operative status, risks for potential nutrition/hydration issues related to removing/tampering with medical equipment, protection of medical procedures, or protection of medical device access.  Outcome: Progressing

## 2021-03-28 NOTE — Unmapped (Signed)
Pt continued to not follow direction, unable to redirect several times. Pt sat up in bed several times. No hematoma or bleeding at left femoral angiogram site noted at this time. Dr. Luther Bradley made aware.     At this time pt is resting with eyes closed.

## 2021-03-29 LAB — ABO/RH: Rh Type: POSITIVE

## 2021-03-29 LAB — HIGH SENSITIVITY TROPONIN: High Sensitivity Troponin: 19 ng/L (ref 0–20)

## 2021-03-29 LAB — POC GLU MONITORING DEVICE: POC Glucose Monitoring Device: 100 mg/dL (ref 70–100)

## 2021-03-29 MED ORDER — hydrALAZINE (APRESOLINE) 20 mg/mL injection 10 mg
20 | INTRAMUSCULAR | PRN
Start: 2021-03-29 — End: 2021-04-06
  Administered 2021-03-29 (×2): 10 mg via INTRAVENOUS

## 2021-03-29 MED ORDER — aspirin suppository 300 mg
300 | Freq: Once | RECTAL
Start: 2021-03-29 — End: 2021-03-30

## 2021-03-29 MED ORDER — valsartan (DIOVAN) tablet 80 mg
80 | Freq: Every day | ORAL | Status: AC
Start: 2021-03-29 — End: 2021-03-29

## 2021-03-29 MED ORDER — cloNIDine (CATAPRES) 0.1 mg/24 hr 1 patch
0.1 | TRANSDERMAL | Status: AC
Start: 2021-03-29 — End: 2021-04-02
  Administered 2021-03-29: 18:00:00 1 via TRANSDERMAL

## 2021-03-29 MED ORDER — electrolyte-R (pH 7.4) (NORMOSOL-R pH 7.4) iv solution SolP
INTRAVENOUS | Status: AC
Start: 2021-03-29 — End: 2021-03-30
  Administered 2021-03-29: 21:00:00 50 mL/h via INTRAVENOUS

## 2021-03-29 MED FILL — HEPARIN (PORCINE) 5,000 UNIT/ML INJECTION SOLUTION: 5000 5,000 unit/mL | INTRAMUSCULAR | Qty: 1

## 2021-03-29 MED FILL — HYDRALAZINE 20 MG/ML INJECTION SOLUTION: 20 20 mg/mL | INTRAMUSCULAR | Qty: 1

## 2021-03-29 MED FILL — ASPIRIN 300 MG RECTAL SUPPOSITORY: 300 300 MG | RECTAL | Qty: 1

## 2021-03-29 MED FILL — CLONIDINE 0.1 MG/24 HR WEEKLY TRANSDERMAL PATCH: 0.1 0.1 mg/24 hr | TRANSDERMAL | Qty: 1

## 2021-03-29 MED FILL — CLOPIDOGREL 75 MG TABLET: 75 75 mg | ORAL | Qty: 1

## 2021-03-29 MED FILL — ASPIRIN 81 MG CHEWABLE TABLET: 81 81 MG | ORAL | Qty: 1

## 2021-03-29 NOTE — Unmapped (Addendum)
[place]  Case Management/Social Work Department  Progress Note    Patient Information     Patient Name: [name]  MRN: [id number]  Hospital day: 1  Inpatient/Observation:  Inpatient   Level of Care:  Neuro Step Down  Admit date:  03/28/2021  Admission diagnosis: Cerebrovascular accident (CVA) due to occlusion of left middle cerebral artery (CMS-HCC) [I63.512]    PMH:  has a past medical history of COPD (chronic obstructive pulmonary disease) (CMS-HCC), Diabetes mellitus (CMS-HCC), Dysphagia, GERD (gastroesophageal reflux disease), Hematuria, Hepatitis, and Hypertension.    PCP:  Vevelyn FrancoisELIZABETH ANNE DORIOTT, DO    Home Pharmacy:    [facility] [address] - [place], [place] - [address]  [address]  [place] [id number]  Phone: [phone]     [facility]  [address]  [place] [id number]  Phone: [phone]         Medical Insurance Coverage:  Payor: UNITED HEALTHCARE MANAGED MEDICARE / [facility]: [facility] / Product Type: Medicare Mngd Care /     Other Pertinent Information     RN Case Manager participated in interdisciplinary rounds with the MD team and completed chart review. Per team, stroke patient who left AMA before full work up completed on 2/22, pending echo and loop recorder placement, RN noted in rounds patient refused all medications last night, patient in restraints d/t agitation. PT/OT recommendations pending, if placement will require pre-cert.     1030 - RN CM performed chart review, sent message to bedside RN asking if family at bedside, won't be there until after noon. RN CM will follow up with psychosocial assessment when patient's family is at bedside d/t patient's current agitation. RN CM notified team.     Discharge Plan     Anticipated discharge plan:  TBD pending PT/OT evaluation     Anticipated discharge date:  3-4 days     CM/SW will continue to follow and remain available for discharge planning needs.      Quay BurowLisa [place], RN CM     Cell  [phone]

## 2021-03-29 NOTE — Unmapped (Signed)
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  [address]  [place] [address]  Phone: [phone]     [facility]  [address]  [place] [address]  Phone: [phone]          Issues related to obtaining medications: N/A    Payor Information   Medical Insurance Coverage:  Payor: UNITED HEALTHCARE MANAGED MEDICARE / Plan: [facility] MEDICARE HMO COMPLETE / Product Type: Medicare Mngd Care /   Secondary Payor: [facility]    Functional Assessment   Functional Assessment  Assessment Information Obtained From:: Significant Other, Chart Review  May We Obtain Collateral Information From Family, Friends and Neighbors?: Yes  Current Mental Status: Unable to Assess (Assessment performed by telephone d/t patient's agitation)  Mental Status Prior to Admission: Unable to Assess  Mental Health History: No  Suicide Attempts: No  Activities of Daily Living: Independent  ADL Comments: Independent prior to admission  Work History: Disabled  Job-Profession:: Doctor, hospital  Marital Status: Significant Other/Life Partner  Number of children and their names: 66 [age] year-old daughter  Relative Search Completed: No  Demographics Correct:: Yes  Discharge Destination: Other (Comment) (TBD pending PT/OT recommendations)    Current Living Arrangements   Current Living Arrangements  Current Living Arrangements: Home  Type of Housing: Apartment  Who do you live with?: With Family  What family member?: Significant other and minor child  One Story or Two (check all that apply):  One Financial planner the number of steps and rails to enter the residence: 0 steps to enter  Enter the number of steps and rails inside the residence: N/A  History of Falls?: No    Electrical engineer at Home: DME  DME Current: Agricultural consultant, [employer]  Was any abuse reported by patient?: No    Support Systems   Emergency contact: Extended Emergency Contact Information  Primary Emergency Contact: Achor,Bethany (HCPOA)   [employer] [place] [employer] Phone: [phone]  Relation: Significant other  Secondary Emergency Contact: Thompson,Carolyn   [employer] Phone: [phone]  Relation: Sister    Support Systems  Legal Status: HCPOA  Name of Guardian/POA/ Payee and Phone Number: [place] Hun, [phone]  Primary Caregiver: Self  Times of available support: Limited 24/7 hands on (add comment) (lives with significant other, family can assist as needed)  Marital Status: Significant Other/Life Partner  Number of children and their names: 66 [age] year-old daughter  Relative Search Completed: No  Demographics Correct:: Yes  Discharge Destination: Other (Comment) (TBD pending PT/OT recommendations)  Next of Kin: Lawyer  Next of Kin Relationship:  Significant Other  Next of Kin Phone Number: [phone]  Assessment Information Obtained From:: Significant Other, Chart Review    Other Pertinent Information     Patient's significant other is HCPOA, paperwork in chart.  Advance Directives (For Healthcare)  Advance Directive: Patient has advance directive, copy in chart  Type of  Healthcare Directive: Durable power of attorney for health care  Healthcare Agent Appointed: Yes  Healthcare Agent's Name: Berlin Hun  Healthcare Agent's Phone Number: [phone]  Pre-existing DNR/DNI Order: No  Patient Requests Assistance: No    Discharge Plan     RN CM called patient's significant other to initiate discussion regarding discharge planning due to patient's  agitation. Introduced self and role of case management/social work and provided Tour manager. Patient lives with significant other and their 49 year old daughter in a one story apartment, no steps to enter. Patient has a rolling walker and single-point cane, no other DME, no O2 or dialysis needs in community, no history with HHC or facility placement after hospitalization, did outpatient therapy at [facility] after surgery in 2021. Verified PCP information, patient obtains medications through [facility] on [facility]. Denies [employer] service, mental health diagnoses or SI.      Anticipated Discharge Plan: TBD pending PT/OT evaluation    Anticipated Discharge Date: 2-3 days    Anticipated Transportation: medical/stretcher vs family    Patient/Family aware and taking part in the discharge plan.  Patient/family educated that once post-acute care needs have been identified, a provider list applicable to the identified post-acute care needs as well as the insurance provider will be provided, and patient/family have the freedom to choose their provider(s); financial interest(s) are disclosed as appropriate.    Quay Burow, RN CM  Phone Number: [phone]

## 2021-03-29 NOTE — Unmapped (Signed)
[place]  Case Management/Social Work Department  Discharge Planning Screen    Screening Questions     Do you need help filling out medical forms: No  Is patient from anywhere other than a private residence? (shelter, SNF, LTC, IPR, LTAC, etc.): No  Do you have any services that come into the house to help you? (COA, private duty, HHC, etc): No  Do you have barriers getting to follow up appointment or obtaining prescriptions?: No  Have you been to the (ED/hospital) 4x times in the past 6 months? : No    Patient meet criteria for full assessment; SW/CM will follow up.    Quay Burow MSN, RN CM  [phone]

## 2021-03-29 NOTE — Unmapped (Signed)
Speech Language Pathology   Reason Patient Not Seen     Name: [name]  DOB: 06-15-1955  Attending Physician: Leslye Peerobert Stanton, MD  Admission Diagnosis: Cerebrovascular accident (CVA) due to occlusion of left middle cerebral artery (CMS-HCC) [Z61.096][I63.512]  Date: 03/29/2021  Precautions: Precautions: none  Reviewed Pertinent hospital course: Yes    SLP attempted to see patient for swallow and speech/language evaluations.  The patient refused to participate in either evaluation, repeatedly stating no thank you loudly and adamantly.  SLP explained role of SLP and purpose of swallow and speech/language evaluations and offered pt multiple items to drink and eat. Pt continued to refuse, even after he was notified that he may have to remain NPO if swallow evaluation is not completed. Pt loudly yelled no thank you and goodbye!, so SLP left the room and notified nursing staff. Will re-attempt evaluations by 3/3 as able/appropriate.     Time Attempted: 1350      Tylene FantasiaJenna Shelina Luo, MA, Theatre stage managerCCC-SLP   Speech-Language Pathologist -- Rehab Services  Registered MBSImP Clinician  [phone](Ascom) [phone] (office)   [email or web address]  T-F 7:30-4:00

## 2021-03-29 NOTE — Unmapped (Signed)
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 Patient threatening violence and cursing at therapist stating B*tch leave me alone. Hold OT and PT evaluation pending improvement in agitation.     Time Attempted: 924-930

## 2021-03-30 ENCOUNTER — Inpatient Hospital Stay: Admit: 2021-03-30 | Payer: Medicare (Managed Care)

## 2021-03-30 ENCOUNTER — Encounter

## 2021-03-30 LAB — RENAL FUNCTION PANEL W/EGFR
Albumin: 4 g/dL (ref 3.5–5.7)
Anion Gap: 10 mmol/L (ref 3–16)
BUN: 16 mg/dL (ref 7–25)
CO2: 24 mmol/L (ref 21–33)
Calcium: 9.6 mg/dL (ref 8.6–10.3)
Chloride: 102 mmol/L (ref 98–110)
Creatinine: 1.1 mg/dL (ref 0.60–1.30)
EGFR: 74
Glucose: 96 mg/dL (ref 70–100)
Osmolality, Calculated: 283 mOsm/kg (ref 278–305)
Phosphorus: 3.6 mg/dL (ref 2.1–4.5)
Potassium: 3.6 mmol/L (ref 3.5–5.3)
Sodium: 136 mmol/L (ref 133–146)

## 2021-03-30 LAB — CBC
Hematocrit: 45.2 % (ref 38.5–50.0)
Hemoglobin: 15.4 g/dL (ref 13.2–17.1)
MCH: 30.8 pg (ref 27.0–33.0)
MCHC: 34 g/dL (ref 32.0–36.0)
MCV: 90.5 fL (ref 80.0–100.0)
MPV: 8.1 fL (ref 7.5–11.5)
Platelets: 232 10*3/uL (ref 140–400)
RBC: 4.99 10*6/uL (ref 4.20–5.80)
RDW: 14 % (ref 11.0–15.0)
WBC: 9.6 10*3/uL (ref 3.8–10.8)

## 2021-03-30 LAB — LIPID PANEL
Cholesterol, Total: 174 mg/dL (ref 0–200)
HDL: 47 mg/dL — ABNORMAL LOW (ref 60–92)
LDL Cholesterol: 116 mg/dL
Non-HDL Cholesterol, Calculated: 127 mg/dL (ref 0–129)
Triglycerides: 57 mg/dL (ref 10–149)

## 2021-03-30 LAB — MAGNESIUM: Magnesium: 1.9 mg/dL (ref 1.5–2.5)

## 2021-03-30 MED ORDER — LORazepam (ATIVAN) injection 1 mg
2 | Freq: Once | INTRAMUSCULAR | Status: AC
Start: 2021-03-30 — End: 2021-03-31

## 2021-03-30 MED ORDER — electrolyte-R (pH 7.4) (NORMOSOL-R pH 7.4) iv solution SolP
INTRAVENOUS | Status: AC
Start: 2021-03-30 — End: 2021-03-30
  Administered 2021-03-30: 13:00:00 50 mL/h via INTRAVENOUS

## 2021-03-30 MED ORDER — ARIPiprazole (ABILIFY) tablet 5 mg
5 | Freq: Every day | ORAL | Status: AC
Start: 2021-03-30 — End: 2021-04-06
  Administered 2021-03-31 – 2021-04-06 (×7): 5 mg via ORAL

## 2021-03-30 MED ORDER — valsartan (DIOVAN) tablet 40 mg
80 | Freq: Every day | ORAL | Status: AC
Start: 2021-03-30 — End: 2021-03-31
  Administered 2021-03-30: 19:00:00 40 mg via ORAL

## 2021-03-30 MED FILL — LORAZEPAM 2 MG/ML INJECTION SOLUTION: 2 2 mg/mL | INTRAMUSCULAR | Qty: 1

## 2021-03-30 MED FILL — ARIPIPRAZOLE 5 MG TABLET: 5 5 MG | ORAL | Qty: 1

## 2021-03-30 MED FILL — VALSARTAN 80 MG TABLET: 80 80 MG | ORAL | Qty: 1

## 2021-03-30 MED FILL — CLOPIDOGREL 75 MG TABLET: 75 75 mg | ORAL | Qty: 1

## 2021-03-30 MED FILL — TAMSULOSIN 0.4 MG CAPSULE: 0.4 0.4 mg | ORAL | Qty: 1

## 2021-03-30 MED FILL — HEPARIN (PORCINE) 5,000 UNIT/ML INJECTION SOLUTION: 5000 5,000 unit/mL | INTRAMUSCULAR | Qty: 1

## 2021-03-30 NOTE — Unmapped (Signed)
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 3/1: HCT- evolving L MCA infarct, CTA- no acute findings, CTP- moderate area of hypoperfusion in the L MCA territory with a central ischemic core of 25 mL and a 48 mL penumbra, s/p cerebral angio- distal L M2 occlusion with severe stenosis and no intervention  Relevant PMH : L MCA stroke (L M3 occlusion s/p tPA in 2017), HTN, DMII, R parotid mass (pending excision), COPD  Precautions: none  Activity Level: Activity as tolerated    Assessment  Assessment: Impaired Bed Mobility, Impaired Transfers, Impaired Gait, Impaired Balance, Impaired Activity Tolerance, Impaired Cognition, Impaired Safety Awareness  Prognosis: Good   [name] is cooperative with PT evaluation today with significant expressive deficits, L gaze preference and R neglect and decreased balance limiting his overall safety and functional independence.  He would continue to benefit from PT at Rehab upon discharge to maximize his functional independence.     Recommendation  Recommendation: IP Rehab  Equipment Recommended: Defer until further assessment         Outcome Measures  AM-PAC 6 Clicks Basic Mobility Inpatient Short Form: PT 6 Clicks Score: 18          Mobility Recommendations for Staff  Patient ability: Patient ambulates in hallway, Patient ambulates in room/ to bathroom  Assist needed: with 1 person assist    Home Living/Prior Function  Comments: Information  Lives With: Significant other  Type of Home: Apartment  Home Entry: No steps to enter  Home Layout: One level  Home Equipment: Single-point Water quality scientist  Prior Function  Functional Mobility:  Independent ( no assistive device)     Pain  Pain Score:  (no number given)  Pain Location: Head  Pain Descriptors: Patient unable to describe  Pain Intervention(s): Ambulation/increased activity  Therapist reported pain to: RN monitoring    Vision  Vision/Perception  Overall Vision/ Perception: Impaired  Impairments: Perception  Inattention/Neglect: Cues to attend right visual field;Cues to attend to right side of body       Cognition  Overall Cognitive Status: Impaired  Cognitive Assessment: Arousal/ Alertness;Orientation Level;Behavior;Following Commands;Safety Judgment  Arousal/Alertness: Alert  Orientation Level:  (responds to his name)  Behavior: Cooperative  Following Commands: Follows one step commands;Requires repetition of instruction;Requires tactile cues;Requires demonstration  Safety Judgment: Decreased awareness of need for assistance;Impaired judgment;Decreased safety awareness    Neuromuscular  Overall Sensation: Within Functional Limits          Upper Extremity  UE Assessment: Strength WFL ( at least 3+/5) as observed during functional activity    Lower Extremity  Lower Extremity  LE Assessment: Strength WFL (at least 3+/5) as observed during functional activity          Functional Mobility  Bed Mobility   Supine to Sit: Minimal assistance;towards the right (with tactile cueing for initiation of B LE off EOB)  Transfers  Sit to Stand: Minimal assistance (x 4 reps from EOB)  Stand to Sit: Minimal assistance  Bed to Chair: Minimal assistance;stand step;towards the right  Gait  Distance (in feet): 125 ft  Level of assistance: Minimal assistance  Assistive Device: None  Gait Characteristics: Unsteady;R decreased step  length;L decreased step length;decreased cadence (with maximal cueing for obstacle avoidance on the right with decreased attention to the right)  Balance  Sitting - Static: Supervision  Sitting-Dynamic: Contact Guard Assistance   Standing-Static: Advertising account executive Assistance  Standing-Dynamic:  Minimal Assistance (with posterior LOB while donning pants)         Position after Therapy/Safety Handoff  Position after treatment and safety handoff  Position after therapy session: Chair  Details: RN notified;Call light/ needs within reach (SLP)  Alarms: Chair  Alarms Status: Activated and Interfaced with call system    Goals  Collaborated with: Patient  Patient Stated Goal: patient unable to state  Goals to be met by: 04/06/21  Patient will transition from supine to sit: Stand-By assistance  Patient will transfer from sit to stand: Contact Guard assistance  Patient will ambulate: Contact Guard assistance, distance (in feet)  Distance (in feet): 200 ft  Long-term goal to be met by: 04/13/21  Long Term Goal : Pt will ambulate 200 ft with 100 % obstacle on the right side     Patient/Family Education  Educated patient on the role of physical therapy, goals, plan of care, importance of increased activity and discharge recommendations and fall prevention strategies, including need for supervision/ assistance with OOB activity and use of call light; patient needed cues and will need reinforcement. Handout(s) issued: none.    Plan  Plan  Treatment/Interventions: Patient/family training, Compensatory technique education, Gait training, Continued evaluation, Therapeutic Activity, Neuromuscular Reeducation  PT Frequency: minimum 3x/week    The plan of care and recommendations assesses the patient's and/or caregiver's readiness, willingness, and ability to provide or support functional mobility and ADL tasks as needed upon discharge.        Time  Start Time: 1007  Stop Time: 1026  Time Calculation (min): 19 min    Charges   $PT Evaluation Mod Complex 30 Min: 1 Procedure                    Problem List  Patient Active Problem List   Diagnosis   ??? Personal history of other specified diseases(V13.89)   ??? Gastroesophageal reflux disease without esophagitis   ??? Abnormal liver enzymes   ??? Hiatal hernia   ??? Chronic viral hepatitis  B without delta agent and without coma (CMS-HCC)   ??? 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 Ischemic stroke (CMS-HCC)   ??? 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 Essential hypertension   ??? Type 2 diabetes mellitus with circulatory disorder (HCC)   ??? Chronic obstructive pulmonary disease (CMS-HCC)   ??? Spondylosis of cervical region without myelopathy or radiculopathy   ??? Screen for colon cancer   ??? Lumbar radiculopathy   ??? 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 COPD (chronic obstructive pulmonary disease) (CMS-HCC)    ??? Diabetes mellitus (CMS-HCC)    ??? Dysphagia    ??? GERD (gastroesophageal reflux disease)    ??? Hematuria    ??? Hepatitis    ??? Hypertension       Past Surgical History  Past Surgical History:   Procedure Laterality Date   ??? ESOPHAGOGASTRODUODENOSCOPY N/A 11/19/2013    Procedure: ESOPHAGOGASTRODUODENOSCOPY WITH MAC;  Surgeon: Charlane Ferretti, MD;  Location: [facility] ENDOSCOPY;  Service: Gastroenterology;  Laterality: N/A;   ??? FOOT  SURGERY Left    ??? HERNIA REPAIR      about 5 yrs ago

## 2021-03-30 NOTE — Unmapped (Signed)
[facility]  Medical Nutrition Therapy    Reason(s) for Completion: Physician/Nursing Referral    Diet Order/Nutrition Support: Dysphagia Level 1: Pureed Thin    Pertinent Information: [name] is a 66 yo with PMH of HTN, T2DM and recent Left MCA stroke 2/2 left M2 occlusion (03/18/2021) who was admitted to the inpatient service and left AMA on 03/21/2021. He underwent a DSA and it showed severe stenosis of the left M2 and no thrombectomy was performed as the occlusion resolved without intervention. He was put on DAPT, but unfortunately, he left AMA and was not taking his medications. Pt presented to the hospital after his daugther found him with speech difficulty worse than his baseline. LWK: 10-11 pm when he went to sleep and was found in the morning with aphasia. In the ED, he presented with an NIHSS: 6 and his CTA showed an acute Left M2 occlusion (Worse than his previous CTA obtained in February that showed severe stenosis). He underwent a CTP which showed a mismatch ratio of 2.9 in the left MCA territory. IR team planning EVT. SLP recommended Dysphagia 1 (puree) with Thin liquids this morning.     Nutrition consult received for malnutrition screening, deferred due to current AMS/agitation with staff. Diet advanced this morning following SLP evaluation. Weight hx reviewed, appears overall stable though pt in need of current weight, last weight 03/19/21. Will continue to monitor. Last BM unknown.     LOS: 2 days  Patient Active Problem List   Diagnosis   ??? Personal history of other specified diseases(V13.89)   ??? Gastroesophageal reflux disease without esophagitis   ??? Abnormal liver enzymes   ??? Hiatal hernia   ??? 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 Ischemic stroke (CMS-HCC)   ??? 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 Essential hypertension   ??? Type 2 diabetes mellitus  with circulatory disorder (HCC)   ??? Chronic obstructive pulmonary disease (CMS-HCC)   ??? Spondylosis of cervical region without myelopathy or radiculopathy   ??? Screen for colon cancer   ??? Lumbar radiculopathy   ??? 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 COPD (chronic obstructive pulmonary disease) (CMS-HCC)    ??? Diabetes mellitus (CMS-HCC)    ??? Dysphagia    ??? GERD (gastroesophageal reflux disease)    ??? Hematuria    ??? Hepatitis    ??? Hypertension        Scheduled Meds:   ??? aspirin  81 mg Oral Daily with breakfast   ??? atorvastatin  80 mg Oral Nightly (2100)   ??? cloNIDine  1 patch Transdermal Weekly   ??? clopidogreL  75 mg Oral Daily 0900   ??? heparin  5,000 Units Subcutaneous 3 times per day   ??? tamsulosin  0.4 mg Oral Nightly (2100)      Continuous Infusions:  ??? electrolyte 50 mL/hr (03/30/21 0803)      PRN Meds:hydrALAZINE, ondansetron     Pertinent Labs:   Lab Results   Component Value Date    CREATININE 1.10 03/30/2021    BUN 16 03/30/2021    NA 136 03/30/2021    K 3.6 03/30/2021    CL 102 03/30/2021    CO2 24 03/30/2021     Lab Results  Component Value Date    ALBUMIN 4.0 03/30/2021     Lab Results   Component Value Date    CALCIUM 9.6 03/30/2021    PHOS 3.6 03/30/2021     Lab Results   Component Value Date    MG 1.9 03/30/2021     Lab Results   Component Value Date    POCGMD 100 03/29/2021     Lab Results   Component Value Date    HGBA1C 5.8 (H) 03/20/2021     Skin Integrity: Intact  Braden Score: 15 - Mils risk for skin breakdown  Edema: WDL    Potential Nutrition Related Factor(s):  Skin Integrity, Chewing/Swallowing Difficulty and Altered Mental Status  Food Allergies/Intolerances: NKFA  Cultural Requests: None noted    66 y.o.   Male   Ht Readings from Last 1 Encounters:   03/18/21 6' (1.829 m)     Wt Readings from Last 1 Encounters:   03/19/21 180 lb (81.6 kg)      Usual Weight: See below  Ideal Body Weight: 178 lb  (81 kg) +/- 10%  Other: 101% IBW  Weight History:   Wt Readings from Last 15 Encounters:   03/19/21 180 lb (81.6 kg)   02/05/21 181 lb (82.1 kg)   01/12/21 177 lb (80.3 kg)   01/05/20 190 lb (86.2 kg)   03/01/19 198 lb (89.8 kg)   06/30/18 190 lb 14.4 oz (86.6 kg)   04/01/18 188 lb 3.2 oz (85.4 kg)   01/13/18 183 lb 3.2 oz (83.1 kg)   04/03/17 180 lb (81.6 kg)   12/25/16 181 lb (82.1 kg)   11/29/15 171 lb 9.6 oz (77.8 kg)   11/16/15 172 lb (78 kg)   09/12/15 168 lb (76.2 kg)   07/29/15 168 lb (76.2 kg)   04/04/14 178 lb (80.7 kg)     Estimated Nutrition Needs:   Needs based On: 81.6 kg - last known weight  Kcals/day: 5784-6962 (25-30 kcal/kg)  Protein g/day: 82-98 (1-1.2 g/kg)  Carbohydrate g/day: 45-55% of total calories  Fluid ml/day: ~1 ml/kcal or per MD    Nutrition Related Problems:   Nutrition Diagnosis: Inadequate oral intake  Related To: AMS, dysphagia  As Evidenced By: Need for modified diet texture     Recommended Interventions: Monitor PO Intake/Tolerance  Goals:Total energy intake improved as evidenced by PO intake at least 50-75% of meals/supplements/snacks within 3-4 days    Nutrition Status Classification: Moderately Compromised    Discharge Planning and Education: Discharge plan of care for nutrition pending clinical course.    Follow up per nutrition services protocol while inpatient.    Recommendation(s) to Physician:   ?? Monitor and encourage PO intake  ?? Suggest Boost Glucose Control if taking in <50% of meals   ?? Please obtain current weight as able     Clovis Fredrickson, RD, LD  Clinical Dietitian  Pager# [phone]

## 2021-03-30 NOTE — Unmapped (Addendum)
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    PCP:  Vevelyn Francois, DO    Home Pharmacy:    [facility] [address] [place] - [address]  [address] [place] [address]  Phone: [phone]     [facility]  [address]  [place] [address]  Phone: [phone]         Medical Insurance Coverage:  Payor: [employer] MANAGED MEDICARE / Plan: [facility] MEDICARE HMO COMPLETE / Product Type: Medicare Mngd Care /     Other Pertinent Information     RN Case Manager participated in interdisciplinary rounds with the MD team and completed chart review. Per team, pending MRI and echo, refused PT/OT and SLP yesterday, trying again today, anticipate patient will be medically ready for discharge early next week. PT/OT recommendations pending, if placement will require pre-cert.    1140 - IPR recommendation but no note yet, RN CM messaged PT/OT, they are working on notes now. RN CM called patient's significant other Berlin Hun [phone]), left voicemail requesting call back regarding inpatient rehab choices.     1155 - RN CM received call from patient's significant other/Bethany, stated would like to send referral to [facility], RN CM sent referral.     1200 - RN CM received call from [facility] [phone]), will review and talk with patient's significant other, will call RN CM with update.     1430 - RN CM called  [facility], verified they can accept, notified OT note is signed, will start pre-cert.     Discharge Plan     Anticipated discharge plan:  TBD pending PT/OT evaluation     Anticipated discharge date:  3-4 days     CM/SW will continue to follow and remain available for discharge planning needs.      Quay Burow, RN CM     Cell [phone]

## 2021-03-30 NOTE — Unmapped (Signed)
Speech Language Pathology  Speech, Language and Cognitive Initial Assessment    Name: [name]  DOB: 1956-01-16  Attending Physician: Leslye Peer, MD  Admission Diagnosis: Cerebrovascular accident (CVA) due to occlusion of left middle cerebral artery (CMS-HCC) [I63.512]  Date: 03/30/2021  Precautions: None  Reviewed Pertinent hospital course: Yes  Hospital Course SLP: 66 y.o. male presented with acute onset of expressive aphasia and admitted for acute ischemic stroke. HCT (3/1): Evolving left MCA distribution infarct in the general region of diffusion restriction on MRI 03/19/2021. No intracranial mass effect or definite hemorrhage. Chronic white matter disease and remote left basal ganglia lacunar infarct. CT Cerebral Perfusion (3/1): moderate area of hypoperfusion in the left MCA territory with a central ischemic core of 25 mL and a 48 mL penumbra.  Relevant PMH: L MCA strokes (most recent 03/18/21), HTN, and T2DM.  SLP Hx- outpatient cognitive therapy in 2017.  Evaluated by SLP 03/19/21: mild receptive aphasia, cognitive-linguistic deficits, functional oropharyngeal swallowing, rx: Regular diet/Thin liquids.    Assessment: Patient presents with severe nonfluent expressive aphasia characterized by anomia, reduced verbal output, verbal perseveration and reduced repetition skills and moderately severe receptive aphasia characterized by reduced auditory comprehension in the domains of command following, answering yes/no questions and identifying objects/pictures. Continued acute SLP intervention is indicated.     Most Effective Means of Communication:   Gestures/pointing    Communication Board Status:  Unsuccessful    The patient was administered the bedside version of the Western Aphasia Battery--Revised (Bedside Aphasia Score) to assess speech fluency, auditory comprehension, verbal expression The patient's scores were as follows:   Component Score Total   Speech Content 1 10   Speech Fluency 3 10   Yes/No  Questions 2 10   Sequential Commands 3 10   Repetition 0 10   Object Naming 1 10                  Composite Score 10 60     Plan/Recommendation:   - SLP therapy 1-3x/week while inpatient  - SLP at discharge is recommended    Prognosis  Diagnosis: Non-fluent expressive aphasia, Receptive aphasia    Goals  Goals Language  Patient will demonstrate ability to indicate wants/needs: via verbal or nonverbal means with 90% accuracy mod cues  Patient will answer yes/no questions: with 75% accuracy mod cues  Patient will follow commands: 1-2 step with 75% accuracy mod cues  Time frame for goals to be met in: 04/13/2021    Problem List  Patient Active Problem List   Diagnosis   ??? Personal history of other specified diseases(V13.89)   ??? Gastroesophageal reflux disease without esophagitis   ??? Abnormal liver enzymes   ??? Hiatal hernia   ??? 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 Ischemic stroke (CMS-HCC)   ??? 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 Essential hypertension   ??? Type 2 diabetes mellitus with circulatory disorder (HCC)   ??? Chronic obstructive pulmonary disease (CMS-HCC)   ??? Spondylosis of cervical region without myelopathy or radiculopathy   ??? Screen for colon cancer   ??? Lumbar radiculopathy   ??? Cerebrovascular accident (CVA) due to occlusion of left middle cerebral artery (CMS-HCC)        Past Medical History  Past Medical History:   Diagnosis Date   ??? COPD (chronic obstructive pulmonary disease) (CMS-HCC)    ???  Diabetes mellitus (CMS-HCC)    ??? Dysphagia    ??? GERD (gastroesophageal reflux disease)    ??? Hematuria    ??? Hepatitis    ??? Hypertension         Past Surgical History  Past Surgical History:   Procedure Laterality Date   ??? ESOPHAGOGASTRODUODENOSCOPY N/A 11/19/2013    Procedure: ESOPHAGOGASTRODUODENOSCOPY WITH MAC;  Surgeon: Charlane Ferretti, MD;  Location: [facility] ENDOSCOPY;  Service: Gastroenterology;   Laterality: N/A;   ??? FOOT SURGERY Left    ??? HERNIA REPAIR      about 5 yrs ago       Orientation:  Person: Yes  Place: No  Time: No  Situation: No    Pain  None stated     Respiratory Status  Respiratory Status: Room air    Receptive Language  One Step Basic Commands: Moderate 50-75%  Two Step Basic Commands: Profound 0-25%  Object Identification: Severe 25-50%    Expressive Language  Primary Mode of Expression: Nonverbal - gestures  Repetition: Profound 0-25%  Perseveration: Present - verbal    Reading/Writing  Reading Status: Unable to assess  Written Expression: Unable to assess     Patient Education  Patient educated on: role of SLP, current POC, and discharge recommendations for SLP therapy  Patient response: Patient was unable to demonstrate understanding    End of Session:  Patient was left upright in chair with call light within reach and all needs met.  Safety handoff completed with RN.        Arman Bogus, MS, CCC-SLP  Speech Language Pathologist  Registered MBSImP Clinician  [facility] Certified  Rehabilitation Services      Time  Start Time: 1022  Stop Time: 1044  Time Calculation (min): 22 min  Charges   $Eval Speech Sound Prd w/Lng Comp & Expr: 1 Procedure  Patient Class   Inpatient

## 2021-03-30 NOTE — Unmapped (Signed)
[facility]  Neurology Department   Daily Progress Note  Subjective:   Chief complaint / Reason for Follow-Up:  [name] is a 66 y.o. M with PMH L MCA strokes (most recent 03/18/21), HTN, and T2DM, who presented with acute onset of recurrent expressive aphasia. He was admitted to the neurology inpatient service for acute ischemic stroke.     Interval History:  Refused PT/OT/SLP yesterday but has worked with all 3 this AM. BP still peaking overnight requiring hydralazine x 2. Patient did end up getting DAPT yesterday, allowing meds to be given once wife arrived.    Today's Plan:  - Monitor BP, gradually increase home meds  - TTE Pending  - MRI w/out Contrast at 4PM  - SBP Goal 120-150    Medications:   is allergic to penicillins.  Scheduled Meds:  ??? aspirin  81 mg Oral Daily with breakfast   ??? aspirin  300 mg Rectal Once   ??? atorvastatin  80 mg Oral Nightly (2100)   ??? cloNIDine  1 patch Transdermal Weekly   ??? clopidogreL  75 mg Oral Daily 0900   ??? heparin  5,000 Units Subcutaneous 3 times per day   ??? tamsulosin  0.4 mg Oral Nightly (2100)     Continuous Infusions:  PRN Meds:.hydrALAZINE, ondansetron  Physical Examination:   Vital Signs:  BP 150/88    Pulse 75    Temp 99.3 ??F (37.4 ??C) (Axillary)    Resp 17    SpO2 98%     Intake/Output Summary (Last 24 hours) at 03/30/2021 0734  Last data filed at 03/30/2021 0400  Gross per 24 hour   Intake --   Output 950 ml   Net -950 ml       General Appearance: calm, appears to be in no acute distress  Pulm: on room air, no inc WOB  CV: slightly tachycardic (90s)  GI: Non-tender, non-distended  Extr: No clubbing, cyanosis or edema    Mental Status:   Appearance: well-developed, well-nourished and in no acute distress   Language/Speech: receptive and expressive aphasia noted, unable to follow axial or appendicular commands on my exam  Atten/concentration: awake and alert, attends well to exam  Orientation: oriented to person. aphasia limits full  assessment  Memory: UTA given aphasia  Fund of Knowledge: UTA given aphasia    Cranial Nerve:  CN II: pupils equal, round, reactive to light with accommodation  CN III/IV/VI: extraocular movements intact   CN V: unable to assess  CN VII: slight R Facial droop, able to smile  CN VIII: hearing intact to voice  CN IX/X: unable to assess  CN XI: unable to assess  CN XII: tongue appears midline when mouth open, but UTA formally    Motor: No atrophy. Patient does not partake with formal neuro exam but was able to walk down hallway earlier. His arms are both antigravity and he has 5/5 biceps bilaterally. Also has knee extension 5/5 b/l.     Sensory:light touch intact bilaterally upper and lower extremities  Coord: no resting tremor noted  Gait: did not assess this AM     Deep Tendon Reflexes:   Did not assess this AM    Laboratory Data:     Recent Labs     03/28/21  1003 03/28/21  1059   NA 134  --    K SEE COMMENT 4.0    Recent Labs     03/28/21  1003   CL  104   CO2 26    Recent Labs     03/28/21  1003   BUN 18   CREATININE 1.21      Recent Labs     03/28/21  1003   CALCIUM 8.6    No results for input(s): MG in the last 72 hours. No results for input(s): PHOS in the last 72 hours.     Recent Labs     03/28/21  1003   GLUCOSE 103*       Recent Labs     03/28/21  1059   WBC 6.7    Recent Labs     03/28/21  1059   HGB 13.6    Recent Labs     03/28/21  1059   PLT 230        No results for input(s): AST, ALT in the last 72 hours. No results for input(s): ALKPHOS, BILITOT in the last 72 hours. No results for input(s): ALBUMIN in the last 72 hours.    Invalid input(s): TP     Recent Labs     03/28/21  1059   PROTIME 13.5    No results for input(s): PTT in the last 72 hours. Recent Labs     03/28/21  1059   INR 1.0        Lab Results   Component Value Date    CHOLTOT 188 07/29/2015    TRIG 51 07/29/2015    HDL 55 (L) 07/29/2015    LDL 123 07/29/2015     Lab Results   Component Value Date    HGBA1C 5.8 (H) 03/20/2021     Imaging  Data:     CT Cerebral Perfusion With IV contrast   Final Result   IMPRESSION:      1.  moderate area of hypoperfusion in the left MCA territory with a central ischemic core of 25 mL and a 48 mL penumbra.         Report Verified by: Alfredo Bach, DO at 03/28/2021 11:13 AM EST      Code Stroke-CT Head WO contrast   Final Result   IMPRESSION:      1.  Evolving left MCA distribution infarct in the general region of diffusion restriction on MRI 03/19/2021. If there is concern for progressive infarction, MRI would be more sensitive for further evaluation.   2.  No intracranial mass effect or definite hemorrhage.   3.  Chronic white matter disease and remote left basal ganglia lacunar infarct.      Report Verified by: Veto Kemps, MD at 03/28/2021 10:02 AM EST      CT Angio Neck W and or WO   Final Result   Addendum (preliminary) 1 of 1   ** ADDENDUM #1**   Following availability of perfusion imaging the CTA was reevaluated. There    is Complete occlusion of the left posterior M2 segment in the peri-insular    sulcus. There is poor collateral flow. These findings correspond to known    perfusion defect      Approved by Helane Gunther, MD on 03/28/2021 4:52 PM EST      I have personally reviewed the images and I agree with this report.      Report Verified by: Leda Roys, MD at 03/29/2021 9:04 AM EST**    ORIGINAL REPORT **   EXAM: CT ANGIOGRAPHY HEAD W &/OR WO WITH VIZ LVO   EXAM: CT ANGIO NECK W AND OR WO CONTRAST  INDICATION: Neuro deficit, acute, stroke suspected      TECHNIQUE: Axial thin section CT angiography of the neck and head was    performed with 80 mL of IOHEXOL 350 MG IODINE/ML INTRAVENOUS SOLUTION    administered intravenously (accession [id number]), 80 mL of IOHEXOL 350    MG IODINE/ML INTRAVENOUS SOLUTION administered intravenously (accession    [id number]). 3-D reconstructions of the cervical and cranial vessels    were performed on a separate 3-D workstation, including MIP, curved MPR,     and VRT images utilizing a radiologist approved protocol and were    interpreted along with source images. Image data from the CTA was sent to    [email or web address] for LVO analysis. This analysis was sent to the stroke team for    clinical use.        COMPARISON: None available.       FINDINGS:      The cervical portion of the exam is limited by motion artifact.      Pulmonary arteries: No included emboli.      Arch anatomy and vessel origins: Standard three-vessel arch anatomy.    Patent innominate, bilateral common carotid, subclavian, and vertebral    artery origins.      Right carotid system: Normal common, external, and internal carotid    arteries.      Left carotid system: Normal common, external, and internal carotid    arteries.      Right vertebral artery: Normal.      Left vertebral artery:  Normal.      Intracranial posterior circulation: Normal bilateral V4 segments.  The    basilar artery enhances normally. Posterior cerebral arteries enhance    normally.       Intracranial anterior circulation: Normal cavernous and supraclinoid    internal carotids. The bilateral middle cerebral arteries and branches    enhance normally. The bilateral anterior cerebral arteries enhance    normally.      Venous structures: Venous structures in the head are fairly well opacified    in the arterial phase with no significant abnormality.      Extravascular findings: No acute orbital abnormality. Clear paranasal    sinuses. Clear mastoid air cells. No included neck mass or adenopathy.    Normal intracranial structures. No compressive abnormality of the cervical    spine. No suspicious lung nodules included.      3D reconstructions of the neck vessels including MIP and curved MPR images    reconstructed on a separate 3D workstation show no evidence of dissection    or pseudoaneurysm.      3D reconstructions of the cranial vessels including MIP and VRT images    reconstructed on a separate 3D workstation show no evidence of     intracranial aneurysm, stenosis, or AV shunting lesion. No occlusive    change is seen.      IMPRESSION:      Neck   1.  No acute arterial abnormality      Head   1.  No acute arterial abnormality      Report Verified by: Leda Roys, MD at 03/28/2021 10:38 AM EST      Final   IMPRESSION:      Neck   1.  No acute arterial abnormality      Head   1.  No acute arterial abnormality      Report Verified by: Leda Roys, MD at 03/28/2021 10:38 AM EST  CT Angio Head W and or WO WITH VIZ LVO   Final Result   Addendum (preliminary) 1 of 1   ** ADDENDUM #1**   Following availability of perfusion imaging the CTA was reevaluated. There    is Complete occlusion of the left posterior M2 segment in the peri-insular    sulcus. There is poor collateral flow. These findings correspond to known    perfusion defect      Approved by Helane Gunther, MD on 03/28/2021 4:52 PM EST      I have personally reviewed the images and I agree with this report.      Report Verified by: Leda Roys, MD at 03/29/2021 9:04 AM EST**    ORIGINAL REPORT **   EXAM: CT ANGIOGRAPHY HEAD W &/OR WO WITH VIZ LVO   EXAM: CT ANGIO NECK W AND OR WO CONTRAST      INDICATION: Neuro deficit, acute, stroke suspected      TECHNIQUE: Axial thin section CT angiography of the neck and head was    performed with 80 mL of IOHEXOL 350 MG IODINE/ML INTRAVENOUS SOLUTION    administered intravenously (accession [id number]), 80 mL of IOHEXOL 350    MG IODINE/ML INTRAVENOUS SOLUTION administered intravenously (accession    [id number]). 3-D reconstructions of the cervical and cranial vessels    were performed on a separate 3-D workstation, including MIP, curved MPR,    and VRT images utilizing a radiologist approved protocol and were    interpreted along with source images. Image data from the CTA was sent to    [email or web address] for LVO analysis. This analysis was sent to the stroke team for    clinical use.        COMPARISON: None available.        FINDINGS:      The cervical portion of the exam is limited by motion artifact.      Pulmonary arteries: No included emboli.      Arch anatomy and vessel origins: Standard three-vessel arch anatomy.    Patent innominate, bilateral common carotid, subclavian, and vertebral    artery origins.      Right carotid system: Normal common, external, and internal carotid    arteries.      Left carotid system: Normal common, external, and internal carotid    arteries.      Right vertebral artery: Normal.      Left vertebral artery:  Normal.      Intracranial posterior circulation: Normal bilateral V4 segments.  The    basilar artery enhances normally. Posterior cerebral arteries enhance    normally.       Intracranial anterior circulation: Normal cavernous and supraclinoid    internal carotids. The bilateral middle cerebral arteries and branches    enhance normally. The bilateral anterior cerebral arteries enhance    normally.      Venous structures: Venous structures in the head are fairly well opacified    in the arterial phase with no significant abnormality.      Extravascular findings: No acute orbital abnormality. Clear paranasal    sinuses. Clear mastoid air cells. No included neck mass or adenopathy.    Normal intracranial structures. No compressive abnormality of the cervical    spine. No suspicious lung nodules included.      3D reconstructions of the neck vessels including MIP and curved MPR images    reconstructed on a separate 3D workstation show no evidence of dissection    or pseudoaneurysm.  3D reconstructions of the cranial vessels including MIP and VRT images    reconstructed on a separate 3D workstation show no evidence of    intracranial aneurysm, stenosis, or AV shunting lesion. No occlusive    change is seen.      IMPRESSION:      Neck   1.  No acute arterial abnormality      Head   1.  No acute arterial abnormality      Report Verified by: Leda Roys, MD at 03/28/2021 10:38 AM EST      Final    IMPRESSION:      Neck   1.  No acute arterial abnormality      Head   1.  No acute arterial abnormality      Report Verified by: Leda Roys, MD at 03/28/2021 10:38 AM EST      MRI Head WO contrast    (Results Pending)       Assessment & Plan:       # Acute ischemic stroke due to left MCA (M2) occlusion  ??? Sx: Woke up with acute onset of aphasia at approx. 0800 on 03/28/21, LKN approx. 2300 on 03/27/21. Initial NIHSS 6.  ??? CT: Evolution of stroke in similar territory to prior from 03/18/21, L MCA.  ??? CTA: Left M2 occlusion.  ??? CTP: 25 mL ischemic core and 48 mL penumbra  ??? Basic stroke risk labs: A1C 5.8% (03/20/2021); TSH 1.03 (03/20/2021). LDL 116  ??  Suspect etiology is severe atherosclerotic disease; he presents with a third ischemic stroke in left MCA territory with highly stenotic MCA, even preventing access to occlusion during angiography. DDx would also include cardioembolic etiologies; unable to complete TTE during recent admission.  -- Continue home ASA 81 mg and clopidogrel 75 mg daily, pending MRI could consider 300mg  Plavix load to reach therapeutic  -- Home atorvastatin 80 mg qHS  -- SBP 120-150 (avoid hypoperfusion). Prn Hydralazine available. Rest of BP management below.   -- TTE/bubble study pending  -- PT/OT/SLP: IPR and Dysphagia 1 Diet  -- Telemetry, neuro checks  - MRI Brain w/out Contrast Pending  -- Agitation: quetiapine 25 mg PRN (spot-dose);   -- Likely will require cardiac monitor on discharge  ??  # Hypertension  Has prescriptions for clonidine 0.2 mg BID, diltiazem 240 mg daily, hydralazine 25 mg BID, losartan 50 mg daily. However, unclear when these were last taken in discussion with family, although fill history appears mostly up to date.  - Valsartan 40mg  daily   -- Clonidine 0.1mg  Patch  - SBP 120-150  ??  Chronic/inactive/resolved problems:  # Type 2 diabetes mellitus: Last A1C at goal. LDSSI.    #Depression- Abilify 5mg  reordered today    Diet:  Dysphagia 1  Code Status: Full  Code  PPx: SQH  Disposition: Neuro Floor    Shawnie Dapper, DO  Department of Neurology   Pager# [phone]  7:34 AM, 03/30/2021

## 2021-03-30 NOTE — Unmapped (Signed)
Stroke Nurse Navigator Note:    Reviewed personalized stroke education with patient's significant other, Bethany, via telephone  Discussed stroke risk factors of previous stroke, high blood pressure, DM and smoking  Patient does follow with PCP  Reviewed FAST and when to call 911  Aware of follow up appointment with Dr. Annett Fabian all questions and concerns    Future Appointments   Date Time Provider Department Center   03/30/2021  4:30 PM [facility] 1 [facility]   07/23/2021 11:00 AM Burnett Harry, MD [facility] NEUR [facility]   11/02/2021  9:00 AM Ernestine Mcmurray, MD [facility] PULM [facility]OL [facility]OL

## 2021-03-30 NOTE — Unmapped (Signed)
RN attempted to assess patient. Patient was asleep, awoke to name. Rn asked patient to participate in asesssment. Patient spoke un interpretable words, shook head no and covered self up with blanket. Rn assessed to the best ability but patient refused and went back to sleep. Patient NPO and has no enteral access. Unable to give oral medication.

## 2021-03-30 NOTE — Unmapped (Signed)
Speech Language Pathology  Clinical Swallow Assessment    Name: [name]  DOB: [birth year]  Attending Physician: Leslye Peer, MD  Admission Diagnosis: Cerebrovascular accident (CVA) due to occlusion of left middle cerebral artery (CMS-HCC) [I63.512]  Date: 03/30/2021  Precautions: None  Reviewed Pertinent hospital course: Yes  Hospital Course SLP: 66 y.o. male presented with acute onset of expressive aphasia and admitted for acute ischemic stroke. HCT (3/1): Evolving left MCA distribution infarct in the general region of diffusion restriction on MRI 03/19/2021. No intracranial mass effect or definite hemorrhage. Chronic white matter disease and remote left basal ganglia lacunar infarct. CT Cerebral Perfusion (3/1): moderate area of hypoperfusion in the left MCA territory with a central ischemic core of 25 mL and a 48 mL penumbra.  Relevant PMH: L MCA strokes (most recent 03/18/21), HTN, and T2DM.  SLP Hx- outpatient cognitive therapy in 2017.  Evaluated by SLP 03/19/21: mild receptive aphasia, cognitive-linguistic deficits, functional oropharyngeal swallowing, rx: Regular diet/Thin liquids.      Assessment: Patient presents with increased risk of oropharyngeal dysphagia secondary to acute LMCA stroke. Pt demonstrates signs of oral swallowing dysfunction including occasional holding with liquids and prolonged mastication/manipulation with puree. Recommend Dysphagia 1 (puree) diet with Thin liquids.    Plan/Recommendation:   - Diet: Dysphagia 1 (puree) with Thin liquids  - SLP therapy 1-3x/week while inpatient  - SLP at discharge is recommended    Orientation:  Person: Yes  Place: No  Time: No  Situation: No    Pain:  None stated     Aspiration Risk  Dysphagia Diagnosis: oral dysphagia    Goals  Goals Short Term Dysphagia  Patient will demonstrate use of swallowing exercises: to improve oral efficiency with 75% accuracy mod cues  Time frame for goals to be met in: 04/06/2021    Goals Long Term Dysphagia  Patient  will utilize safe swallow behaviors to minimize risks of aspiration with: min assist  Time frame for goals to be met in: 04/13/2021    Baseline Assessment  History of Intubation: No  Behavior/Cognition: Alert;Aphasic  Baseline diet: Unknown  Current diet: NPO    Respiratory Status  Respiratory Status: Room air    Cranial Nerve & Laryngeal Function Exam  CNV- Trigeminal: Within Functional Limits  CNVII - Facial : Within Functional Limits  CNX - Vagus: Within Functional Limits  CNVXII - Hypoglossal Status: Within Functional Limits    Dentition and Hearing   Missing teeth    Consistencies Assessed  Thin;Puree    Thin  Presentation: Cup;Straw  Oral:  (Infrequent bolus holding 2-3 seconds)  Pharyngeal: Within Functional Limits;No overt s/s of aspiration    Puree  Presentation: Spoon  Oral: Prolonged Oral Phase  Pharyngeal: No overt s/s of aspiration    Patient and Family Education  Patient and Family Education: Patient educated on, role of SLP, swallowing anatomy & physiology  Education response: Patient was unable to demonstrate understanding    End of Session:  Patient was left in bed with call light within reach and all needs met.  Safety handoff completed with RN.        Arman Bogus, MS, CCC-SLP  Speech Language Pathologist  Registered MBSImP Clinician  [employer] Certified  Rehabilitation Services      Time  Start Time: 1022  Stop Time: 1044  Time Calculation (min): 22 min  Charges   $Clinical Swallow: 1 Procedure  Patient Class   Inpatient

## 2021-03-30 NOTE — Unmapped (Signed)
Patient transported to [facility] for MRi head at 1630.  Patient transported by RN and Transporter. Patient transported with portable monitor. Patient transported back to [phone]. Uneventful transport. [name] M [name]

## 2021-03-30 NOTE — Unmapped (Signed)
Problem: Non-violent, non-self-destructive restraints  Description: Less restrictive alternative interventions will be considered prior to application of restraint devices.  Goal: Patient will be restrained only to maintain safety  Description: Alternatives to restraints can include; moving the patient closer to the nurses station, video monitoring where available, medicating for pain, agitation or psychosis, psychosocial interventions, manipulation of environment, frequent toileting, diversional activities, bed alarms, having family members sit with patient, frequent reorientation, or repositioning.  Outcome: Progressing     Problem: Non-violent restraint safety  Goal: Patient will be free from injury during restraint period  Description: Assess and monitor for unsafe behaviors that can include unintentional harm of self or others, risk for joint dislocation related to post-operative status, risks for potential nutrition/hydration issues related to removing/tampering with medical equipment, protection of medical procedures, or protection of medical device access.  Outcome: Progressing

## 2021-03-30 NOTE — Unmapped (Signed)
Occupational Therapy  Initial Assessment     Name: [name]  DOB: 07-30-1955  Attending Physician: Leslye Peer, MD  Admission Diagnosis: Cerebrovascular accident (CVA) due to occlusion of left middle cerebral artery (CMS-HCC) [I63.512]  Date: 03/30/2021  Room: 1610/R6045  Reviewed Pertinent hospital course: Yes    Hospital Course PT/OT: 66 yo male presents to ED with altered speech. 3/1: HCT- evolving L MCA infarct, CTA- no acute findings, CTP- moderate area of hypoperfusion in the L MCA territory with a central ischemic core of 25 mL and a 48 mL penumbra, s/p cerebral angio- distal L M2 occlusion with severe stenosis and no intervention  Relevant PMH : L MCA stroke (L M3 occlusion s/p tPA in 2017), HTN, DMII, R parotid mass (pending excision), COPD  Precautions: none  Activity Level: Activity as tolerated    Aide: none    Recommendation  Recommendation: IP Rehab  Equipment Recommendations: Defer at this time    Recommend IPR upon d/c from acute services. Pt is currently performing below previous level of function and requires intensive and coordinated skilled services to facilitate safe discharge back to previous living environment. Pt demonstrates ability to tolerate IPR therapy schedule and is motivated to participate in continued therapy services.     Assessment  Assessment: Decreased activity tolerance, Decreased Functional Mobility, Decreased Balance, Decreased ADL status, Decreased sensation, Visual deficit, Decreased UE ROM, Decreased UE strength, Decreased fine motor control, Decreased self-care transfers, Decreased IADLs, Decreased cognition, Decreased Safe judgment during ADL                Prognosis for OT goals: Good                        [name] is supine in bed upon OT arrival and motivated to participate in OT evaluation. Pt demonstrates decreased functional mobility and ADL participation from reported functional baseline. Pt required increased assistance to complete all functional mobility  and self-care ADLs. [name] demonstrates mild R coordination deficits, moderate R inattention deficits with poor insight, and questionable cognition warranting further assessment. Pt participated in education regarding discharge recommendation, energy conservation and importance of safe OOB mobility throughout acute stay. Pt limited by impaired cognition, poor coordination and R neglect. Pt will benefit from skilled acute OT services to increase independence and safety with ADLs and IADLs.       Outcome Measures  AM-PAC 6 Clicks Daily Activity Inpatient Short Form: OT 6 Clicks Score: 17    Home Living/Prior Function  Comments: Information  Lives With: Significant other  Type of Home: Apartment  Home Entry: No steps to enter  Home Layout: One level  Home Equipment: Single-point cane, Agricultural consultant    Prior Function  Functional Mobility: Independent ( no assistive device)     Pain  Pain Score:  (no number given)  Pain Location: Head  Pain Descriptors: Patient unable to describe  Pain Intervention(s): Ambulation/increased activity  Therapist reported pain to: RN monitoring    Cognition  Overall Cognitive Status: Impaired  Cognitive Assessment: Arousal/ Alertness;Orientation Level;Behavior;Following Commands;Safety Judgment  Arousal/Alertness: Alert  Orientation Level:  (responds to his name)  Behavior: Cooperative  Following Commands: Follows one step commands;Requires repetition of instruction;Requires tactile cues;Requires demonstration  Safety Judgment: Decreased awareness of need for assistance;Impaired judgment;Decreased safety awareness    Vision  Overall Vision/ Perception: Impaired  Impairments: Perception  Inattention/Neglect: Cues to attend right visual field;Cues to attend to right side of body  Right Upper Extremity   Right UE ROM: Grossly WFL as observed during functional activities  Right UE Strength: Grossly WFL (at least 3+/5) as observed during functional activities  Right UE Muscle Tone:  Normal  Right Hand Function: Grossly WFL as observed during functional activity  Right UE Additional Comment: moderate proprioceptive deficits. mild inattention      Functional Upper-Extremity Level: The Functional Upper-Extremity Levels (FUEL) is a reliable and valid integrated tool to assess and classify levels of function in engagement and participation for hemiplegic upper-extremity recovery post-stroke. Pt falls into the bolded level as outlined below:       -Functional Assist: involved arm and hand have full AROM; able to use involved UE for all activities and fine motor tasks, but remains the assistive UE with mild awkwardness and weakness    Left Upper Extremity  Left UE ROM: Grossly WFL as observed during functional activities  Left UE Strength: Grossly WFL (at least 3+/5) as observed during functional activities  Left UE Muscle Tone: Normal  Left UE Hand Function: Grossly WFL as observed during functional activites         Neuromuscular  Overall Sensation: Within Functional Limits       Neglect Assessment:   The Sofie Roweratherine Myers Scale (CBS) is a standardized checklist designed to detect presence and degree of unilateral neglect during observation of everyday life situations. It uses a 4-pt rating scale to identify the severity. Pt scored a 15, indicating moderate R neglect.       Functional Mobility  Bed Mobility   Supine to Sit: Minimal assistance;head of bed elevated;towards the right  Sit to Supine:  (pt seated in chair at end of session)  Transfers  Sit to Stand: Minimal assistance (x4 reps from EOB)  Stand to Sit: Minimal assistance  Functional Mobility: Minimal assistance (Pt completes long household distance in prep for IADL completion. Pt demonstrates significant impairments in R inattention, running into multiple objects with no awareness of deficit or course correction compensation.)  Balance  Sitting - Static: Stand by Assistance  Sitting-Dynamic: Stand by assistance   Standing-Static: Minimal  Assistance  Standing-Dynamic: Minimal Assistance    ADL  Upper Body Dressing : Moderate assistance  Upper Body Dressing Deficit: Thread RUE;Thread LUE;Pull around back;Pull down in back;Fasteners  Location Assessed UE Dressing: Seated edge of bed  Lower Body Dressing: Minimal assistance  Lower Body Dressing Deficit: Don/doff pants;Don/doff L sock;Don/doff R sock;Thread RLE;Thread LLE;Verbal cueing;Supervision/safety;Steadying;Set-up  Location Assessed LE Dressing: Seated edge of bed;Standing edge of bed  Toileting Deficit Additional Comments: pt declined this date; +foley         Position after Treatment/Safety Handoff  Position after therapy session: Chair;Recliner  Details: RN notified;Call light/ needs within reach;UE elevated on pillow for edema control;LE elevated on pillow for decreased heel pressure (SLP present)  Alarms: Chair  Alarms Status: Activated and interfaced unchanged from previous settings;Activated and Interfaced with call system    Plan  Plan  Treatment Interventions: ADL retraining, Activity Tolerance training, Energy Conservation, IADL retraining, VP retraining, Cognitive reorientation, Fine motor coordination activities, Patient/Family training, Functional transfer training, UE strengthening/ROM, Therapeutic Activity, Compensatory technique education  OT Frequency: minimum 3x/week    The plan of care and recommendations assesses the patient's and/or caregiver's readiness, willingness, and ability to provide or support functional mobility and ADL tasks as needed upon discharge.    Goals   Goals to be met in: 1 week  Patient stated goal: to decrease pain during mobility and ADLs,  to increase activity, to increase independence  Patient will complete supine to sit in prep for ADLs: Contact Guard assistance  Patient will complete functional chair transfer: Contact Guard assistance  Patient will complete toilet transfer: Will tolerate assessment  Patient will complete grooming task: Will tolerate  assessment (standing sinkside)  Patient will complete upper body dressing: Contact Guard assistance  Patient will complete lower body dressing: Contact Guard assistance  Pt Will tolerate completeing ADLs with pain less than 4/10: .  Pt Will participate in upper extremity HEP to prep for ADLs: .  Miscellaneous Goal #1: Pt will demonstrate 3 instances of R body awareness within session with mod verbal cueing.  Miscellaneous Goal #2: Pt will participate in functional cognition assessment and upgrade  Long Term Goal : Pt will participate in IADL task assessment and upgrade  Long term goal to be met in: 2 weeks  Collaborated with: Patient    Patient/Family Education  Educated patient on the role of occupational therapy, OT goals, OT plan of care, discharge recommendation, ADL training, energy conservation techniques, functional mobility training and the importance of safety and fall prevention strategies including need for supervision/ assistance with OOB activity and use of call light. patient  will need reinforcement.    OT Time  Start Time: 1011  Stop Time: 1026  Time Calculation (min): 15 min    OT Charges  $OT Evaluation Mod Complex 45 Min: 1 Procedure              Problem List  Patient Active Problem List   Diagnosis   ??? Personal history of other specified diseases(V13.89)   ??? Gastroesophageal reflux disease without esophagitis   ??? Abnormal liver enzymes   ??? Hiatal hernia   ??? 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 Ischemic stroke (CMS-HCC)   ??? 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 Essential hypertension   ??? Type 2 diabetes mellitus with circulatory disorder (HCC)   ??? Chronic obstructive pulmonary disease (CMS-HCC)   ??? Spondylosis of cervical region without myelopathy or radiculopathy   ??? Screen for colon cancer   ??? Lumbar radiculopathy   ??? Cerebrovascular accident (CVA) due to occlusion of left middle  cerebral artery (CMS-HCC)      Past Medical History  Past Medical History:   Diagnosis Date   ??? COPD (chronic obstructive pulmonary disease) (CMS-HCC)    ??? Diabetes mellitus (CMS-HCC)    ??? Dysphagia    ??? GERD (gastroesophageal reflux disease)    ??? Hematuria    ??? Hepatitis    ??? Hypertension      Past Surgical History  Past Surgical History:   Procedure Laterality Date   ??? ESOPHAGOGASTRODUODENOSCOPY N/A 11/19/2013    Procedure: ESOPHAGOGASTRODUODENOSCOPY WITH MAC;  Surgeon: Charlane Ferretti, MD;  Location: [facility] ENDOSCOPY;  Service: Gastroenterology;  Laterality: N/A;   ??? FOOT SURGERY Left    ??? HERNIA REPAIR      about 5 yrs ago

## 2021-03-31 MED ORDER — enalapril (VASOTEC) tablet 5 mg
2.5 | Freq: Once | ORAL | Status: AC
Start: 2021-03-31 — End: 2021-03-31
  Administered 2021-04-01: 03:00:00 5 mg via ORAL

## 2021-03-31 MED ORDER — valsartan (DIOVAN) tablet 80 mg
80 | Freq: Every day | ORAL | Status: AC
Start: 2021-03-31 — End: 2021-03-31

## 2021-03-31 MED ORDER — valsartan (DIOVAN) tablet 40 mg
80 | Freq: Every day | ORAL
Start: 2021-03-31 — End: 2021-04-01

## 2021-03-31 MED ORDER — electrolyte-R (pH 7.4) (NORMOSOL-R pH 7.4) iv solution SolP 500 mL
Freq: Once | INTRAVENOUS | Status: AC
Start: 2021-03-31 — End: 2021-04-01

## 2021-03-31 MED FILL — ATORVASTATIN 80 MG TABLET: 80 80 MG | ORAL | Qty: 1

## 2021-03-31 MED FILL — HEPARIN (PORCINE) 5,000 UNIT/ML INJECTION SOLUTION: 5000 5,000 unit/mL | INTRAMUSCULAR | Qty: 1

## 2021-03-31 MED FILL — VALSARTAN 80 MG TABLET: 80 80 MG | ORAL | Qty: 1

## 2021-03-31 MED FILL — ASPIRIN 81 MG CHEWABLE TABLET: 81 81 MG | ORAL | Qty: 1

## 2021-03-31 MED FILL — ARIPIPRAZOLE 5 MG TABLET: 5 5 MG | ORAL | Qty: 1

## 2021-03-31 NOTE — Unmapped (Signed)
Patient refusing all meds, assessments, ekg leads, and pulse ox. Patient regards name but refuses to answer assessment questions. Patient educated on importance of the above things, still refusing. Spouse (Bethany) called and updated on occurrence. Attempted having spouse speak with patient, still refusing to cooperate. Neuro 2 team notified, no new orders.

## 2021-03-31 NOTE — Unmapped (Signed)
Attempted to call patient's significant other, [name], for an update at the number listed in the chart. There was no answer. Left HIPAA-compliant voicemail. Will try again tomorrow.    Signed:  Melida Gimenez, MD  Neurology Resident

## 2021-03-31 NOTE — Unmapped (Signed)
[facility]  Department of Neurology  Inpatient Progress Note    Hospital day: 3   Subjective:     CC/reason for follow-up: Charistopher O Strandberg??is a 66 y.o.??M with PMH L MCA strokes (most recent 03/18/21), HTN, and T2DM, who presented with acute onset of recurrent expressive aphasia. He was admitted to the neurology inpatient service for acute ischemic stroke.??    Interval events:   MRI confirmed stroke.  He remains aphasic with behavioral concerns, refusing much of his care.  Currently out of restraints. Taking some of his medications.    Focused ROS: Unable to perform due to aphasia vs refusal to answer.    Medications:     Medications:  Scheduled Meds:  ??? ARIPiprazole  5 mg Oral Daily 0900   ??? aspirin  81 mg Oral Daily with breakfast   ??? atorvastatin  80 mg Oral Nightly (2100)   ??? cloNIDine  1 patch Transdermal Weekly   ??? clopidogreL  75 mg Oral Daily 0900   ??? electrolyte  500 mL Intravenous Once   ??? heparin  5,000 Units Subcutaneous 3 times per day   ??? LORazepam  1 mg Intravenous Once   ??? tamsulosin  0.4 mg Oral Nightly (2100)   ??? valsartan  80 mg Oral Daily 0900     Continuous Infusions:  PRN Meds:.hydrALAZINE, ondansetron     Objective:   Temp:  [97.5 ??F (36.4 ??C)-99 ??F (37.2 ??C)] 98.5 ??F (36.9 ??C)  Heart Rate:  [64-106] 104  Resp:  [13-21] 16  BP: (119-156)/(67-113) 132/73    General medical exam:  ??? General: Alert, well-appearing; no acute distress.  ??? Eyes: No scleral icterus or conjunctival injection.   ??? CV: Regular rate; regular rhythm; no extremity edema.  ??? Pulm: Symmetric chest rise; normal work of breathing.  ??? Abdomen: Non-distended; refuses palpation.  ??? Musculoskeletal: No gross joint deformities. ROM grossly normal.  ??? Psychiatric: Frustrated mood, angry affect. Refuses most of exam. No obvious response to internal stimuli.  ??? Neurologic:  o MS: Asleep but awakens to voice. Appears oriented to self and situation, but generally does not answer other questions.  o CN: Mild R  nasolabial fold flattening. Speech non-dysarthric.  o Motor: Moves all extremities spontaneously/antigravity. Refuses further assessment.  o Sensory: Intact to light touch in 4/4 extremities.  o Coordination: No gross ataxia or dyskinesias; refuses formal assessment.    Diagnostic Studies:   I have personally reviewed all labs and imaging available in the EMR.   Pertinent findings if present documented below or in A/P.    Assessment/Plan:     [name] is a 66 y.o. M admitted for acute ischemic stroke.    # Acute ischemic stroke due to left MCA (M2) occlusion  ??? Sx:??Woke up with acute onset of aphasia at approx. 0800 on 03/28/21, LKN approx. 2300 on 03/27/21. Initial NIHSS 6.  ??? CT:??Evolution of stroke in similar territory to prior from 03/18/21, L MCA.  ??? CTA:??Left M2 occlusion.  ??? CTP:??25??mL ischemic??core and 48 mL penumbra  ??? MRI: Acute infarct of L parietal-temporal region. L M2 occlusion with abnormal flow void. Interval decrease in spotty diffusion signal in L occipital lobe (evolving subacute infarct).  ??? TTE: LVEF 65-60%, no RWMA, G1DD, normal LA, no shunt/thrombus.  ??? Basic stroke risk labs:??A1C??5.8% (03/20/2021); TSH??1.03??(03/20/2021). LDL 116    Workup complete; remains consistent with atherosclerotic/large-vessel etiology.  Will hold off on clopidogrel load given size of stroke.    --  Continue home ASA 81 mg and clopidogrel 75 mg daily  -- Home atorvastatin 80 mg qHS  -- SBP 120-150 (avoid hypoperfusion). Prn Hydralazine available. Rest of BP management below.   -- PT/OT/SLP: IPR and Dysphagia 1 Diet  -- Telemetry, neuro checks  -- Agitation: quetiapine 25 mg PRN (spot-dose)  ??  # Hypertension, controlled.  Has prescriptions for clonidine 0.2 mg BID, diltiazem 240 mg daily, hydralazine 25 mg BID, losartan 50 mg daily. However, unclear when these were last taken in discussion with family, although fill history appears mostly up to date.  -- Valsartan 40mg  daily   -- Clonidine 0.1mg  Patch  -- SBP  120-150  ??  Chronic/inactive/resolved problems:  # Type 2 diabetes mellitus: Last A1C at goal.  # Depression- Abilify 5mg  reordered today  ??  Diet:  Dysphagia 1  Code Status: Full Code  PPx: SQH  Disposition: Neuro Floor    Signed:  Melida Gimenez, MD  Neurology Resident

## 2021-03-31 NOTE — Unmapped (Signed)
Problem: Potential for Compromised Skin Integrity  Goal: Skin integrity is maintained or improved  Description: Assess and monitor skin integrity. Identify patients at risk for skin breakdown on admission and per policy. Collaborate with interdisciplinary team and initiate plans and interventions as needed.  Outcome: Progressing

## 2021-03-31 NOTE — Unmapped (Signed)
Bed exit alarm sounded, patient at edge of bed. Upon entering the room patient had external foley catheter pulled taut across the bed, had pulled out IV access, and was attempting to get up to go to the bathroom unattended. Educated patient on importance of using call light to ask for assistance with getting up. Ambulated patient to the bathroom and back to bed without occurrence. Patient refusing to allow me to obtain another point of IV access. Patient educated. Breakfast tray arrived, set up tray for patient, refused to have assistance in eating. Bed >45 degrees. Bed alarm activated.

## 2021-04-01 DIAGNOSIS — I63512 Cerebral infarction due to unspecified occlusion or stenosis of left middle cerebral artery: Principal | ICD-10-CM

## 2021-04-01 LAB — CBC
Hematocrit: 38.3 % (ref 38.5–50.0)
Hemoglobin: 13.5 g/dL (ref 13.2–17.1)
MCH: 31.9 pg (ref 27.0–33.0)
MCHC: 35.2 g/dL (ref 32.0–36.0)
MCV: 90.6 fL (ref 80.0–100.0)
MPV: 8.5 fL (ref 7.5–11.5)
Platelets: 201 10*3/uL (ref 140–400)
RBC: 4.23 10*6/uL (ref 4.20–5.80)
RDW: 13.9 % (ref 11.0–15.0)
WBC: 6.4 10*3/uL (ref 3.8–10.8)

## 2021-04-01 LAB — RENAL FUNCTION PANEL W/EGFR
Albumin: 3.9 g/dL (ref 3.5–5.7)
Anion Gap: 8 mmol/L (ref 3–16)
BUN: 12 mg/dL (ref 7–25)
CO2: 26 mmol/L (ref 21–33)
Calcium: 9.4 mg/dL (ref 8.6–10.3)
Chloride: 101 mmol/L (ref 98–110)
Creatinine: 1.07 mg/dL (ref 0.60–1.30)
EGFR: 77
Glucose: 114 mg/dL (ref 70–100)
Osmolality, Calculated: 281 mOsm/kg (ref 278–305)
Phosphorus: 3.1 mg/dL (ref 2.1–4.5)
Potassium: 3.5 mmol/L (ref 3.5–5.3)
Sodium: 135 mmol/L (ref 133–146)

## 2021-04-01 LAB — MAGNESIUM: Magnesium: 1.9 mg/dL (ref 1.5–2.5)

## 2021-04-01 MED ORDER — nicotine (NICODERM CQ) 14 mg/24 hr 1 patch
14 | Freq: Every day | TRANSDERMAL | Status: AC
Start: 2021-04-01 — End: 2021-04-06
  Administered 2021-04-03 – 2021-04-06 (×4): 1 via TRANSDERMAL

## 2021-04-01 MED ORDER — valsartan (DIOVAN) tablet 80 mg
80 | Freq: Every day | ORAL
Start: 2021-04-01 — End: 2021-04-06
  Administered 2021-04-01 – 2021-04-06 (×6): 80 mg via ORAL

## 2021-04-01 MED ORDER — nicotine (polacrilex) (NICORETTE) gum 2 mg
2 | BUCCAL | PRN
Start: 2021-04-01 — End: 2021-04-06

## 2021-04-01 MED FILL — HEPARIN (PORCINE) 5,000 UNIT/ML INJECTION SOLUTION: 5000 5,000 unit/mL | INTRAMUSCULAR | Qty: 1

## 2021-04-01 MED FILL — NICOTINE 14 MG/24 HR DAILY TRANSDERMAL PATCH: 14 14 mg/24 hr | TRANSDERMAL | Qty: 1

## 2021-04-01 MED FILL — ASPIRIN 81 MG CHEWABLE TABLET: 81 81 MG | ORAL | Qty: 1

## 2021-04-01 MED FILL — TAMSULOSIN 0.4 MG CAPSULE: 0.4 0.4 mg | ORAL | Qty: 1

## 2021-04-01 MED FILL — VALSARTAN 80 MG TABLET: 80 80 MG | ORAL | Qty: 1

## 2021-04-01 MED FILL — NICOTINE (POLACRILEX) 2 MG GUM: 2 2 mg | BUCCAL | Qty: 1

## 2021-04-01 MED FILL — CLOPIDOGREL 75 MG TABLET: 75 75 mg | ORAL | Qty: 1

## 2021-04-01 MED FILL — ENALAPRIL MALEATE 2.5 MG TABLET: 2.5 2.5 MG | ORAL | Qty: 2

## 2021-04-01 MED FILL — ARIPIPRAZOLE 5 MG TABLET: 5 5 MG | ORAL | Qty: 1

## 2021-04-01 NOTE — Unmapped (Signed)
Patient with no PIV at beginning of shift. Multiple RNs have attempted to insert PIV in patient. Patient refusing PIV insertion. Marcucci MD aware. Will continue to attempt.

## 2021-04-01 NOTE — Unmapped (Signed)
[facility]  Department of Neurology  Inpatient Progress Note    Hospital day: 4   Subjective:     CC/reason for follow-up: [name]??is a 66 y.o.??M with PMH L MCA strokes (most recent 03/18/21), HTN, and T2DM, who presented with acute onset of recurrent expressive aphasia. He was admitted to the neurology inpatient service for acute ischemic stroke.??    Interval events:   Hypertensive overnight to 170s SYS; no IV access; responded well to PO enalapril but now trending back up this AM to 180s SYS.  Took AM meds but variable participation in care.    Focused ROS: No CP/dyspnea    Medications:     Medications:  Scheduled Meds:  ??? ARIPiprazole  5 mg Oral Daily 0900   ??? aspirin  81 mg Oral Daily with breakfast   ??? atorvastatin  80 mg Oral Nightly (2100)   ??? cloNIDine  1 patch Transdermal Weekly   ??? clopidogreL  75 mg Oral Daily 0900   ??? electrolyte  500 mL Intravenous Once   ??? heparin  5,000 Units Subcutaneous 3 times per day   ??? tamsulosin  0.4 mg Oral Nightly (2100)   ??? valsartan  80 mg Oral Daily 0900     Continuous Infusions:  PRN Meds:.hydrALAZINE, ondansetron     Objective:   Temp:  [97.4 ??F (36.3 ??C)-99 ??F (37.2 ??C)] 97.6 ??F (36.4 ??C)  Heart Rate:  [68-106] 97  Resp:  [5-24] 24  BP: (115-182)/(70-103) 182/102    General medical exam:  ??? General: Alert, well-appearing; no acute distress.  ??? Eyes: No scleral icterus or conjunctival injection.   ??? CV: Regular rate; regular rhythm; no extremity edema.  ??? Pulm: Symmetric chest rise; normal work of breathing.  ??? Abdomen: Non-distended; refuses palpation.  ??? Musculoskeletal: No gross joint deformities. ROM grossly normal.  ??? Psychiatric: Frustrated mood, angry affect. Refuses most of exam. No obvious response to internal stimuli.  ??? Neurologic:  o MS: Asleep but awakens to voice. Appears oriented to self and situation. Follows simple commands, but unable to complete more complex commands/two-step commands.  o CN: Mild R nasolabial fold  flattening. Speech non-dysarthric.  o Motor: Moves all extremities spontaneously/antigravity. Refuses further assessment.  o Sensory: Intact to light touch in 4/4 extremities.  o Coordination: No gross ataxia or dyskinesias; refuses formal assessment.    Diagnostic Studies:   I have personally reviewed all labs and imaging available in the EMR.   Pertinent findings if present documented below or in A/P.    Assessment/Plan:     OBED SAMEK is a 66 y.o. M admitted for acute ischemic stroke.    # Acute ischemic stroke due to left MCA (M2) occlusion  ??? Sx:??Woke up with acute onset of aphasia at approx. 0800 on 03/28/21, LKN approx. 2300 on 03/27/21. Initial NIHSS 6.  ??? CT:??Evolution of stroke in similar territory to prior from 03/18/21, L MCA.  ??? CTA:??Left M2 occlusion.  ??? CTP:??25??mL ischemic??core and 48 mL penumbra  ??? MRI: Acute infarct of L parietal-temporal region. L M2 occlusion with abnormal flow void. Interval decrease in spotty diffusion signal in L occipital lobe (evolving subacute infarct).  ??? TTE: LVEF 65-60%, no RWMA, G1DD, normal LA, no shunt/thrombus.  ??? Basic stroke risk labs:??A1C??5.8% (03/20/2021); TSH??1.03??(03/20/2021). LDL 116    Most consistent with atherosclerotic/large-vessel etiology.  Will hold off on clopidogrel load given size of stroke.    -- Continue home ASA 81 mg and  clopidogrel 75 mg daily  -- Home atorvastatin 80 mg qHS  -- SBP 120-150 (avoid hypoperfusion). Prn Hydralazine available. Rest of BP management below.   -- PT/OT/SLP: IPR and Dysphagia 1 Diet  -- Telemetry, neuro checks  -- Agitation: quetiapine 25 mg PRN (spot-dose)  ??  # Hypertension  Has prescriptions for clonidine 0.2 mg BID, diltiazem 240 mg daily, hydralazine 25 mg BID, losartan 50 mg daily. However, unclear when these were last taken in discussion with family, although fill history appears mostly up to date.  -- Valsartan 80 mg daily (increased 04/01/21)  -- Clonidine 0.1 mg Patch  -- SBP 120-150  ??  Chronic/inactive/resolved  problems:  # Type 2 diabetes mellitus: Last A1C at goal.  # Depression- Abilify 5mg  reordered today  ??  Diet:  Dysphagia 1  Code Status: Full Code  PPx: SQH  Disposition: Neuro Floor    Signed:  Melida Gimenez, MD  Neurology Resident

## 2021-04-01 NOTE — Unmapped (Signed)
Problem: Glucose Imbalance related to diabetes disease process  Goal: Clinical indication of glucose balance is achieved  Outcome: Progressing  Goal: Patient's discharge needs are met  Outcome: Progressing     Problem: Knowledge deficit related to self-management of chronic disease  Goal: Patient/family/caregiver demonstrates understanding of disease process, treatment plan, medications, and discharge instructions  Outcome: Progressing     Problem: Potential for imbalanced nutrition related to metabolic effect of diabetes  Goal: Patient's nutritional needs will be met  Outcome: Progressing     Problem: Risk for ineffective therapeutic regimen management related to insulin pump  Goal: Blood glucose is within target range  Outcome: Progressing     Problem: Non-violent, non-self-destructive restraints  Description: Less restrictive alternative interventions will be considered prior to application of restraint devices.  Goal: Patient will be restrained only to maintain safety  Description: Alternatives to restraints can include; moving the patient closer to the nurses station, video monitoring where available, medicating for pain, agitation or psychosis, psychosocial interventions, manipulation of environment, frequent toileting, diversional activities, bed alarms, having family members sit with patient, frequent reorientation, or repositioning.  Outcome: Progressing     Problem: Non-violent restraint safety  Goal: Patient will be free from injury during restraint period  Description: Assess and monitor for unsafe behaviors that can include unintentional harm of self or others, risk for joint dislocation related to post-operative status, risks for potential nutrition/hydration issues related to removing/tampering with medical equipment, protection of medical procedures, or protection of medical device access.  Outcome: Progressing     Problem: Neurological Deficit  Goal: Neurological status is stable or  improving  Description: Monitor and assess patient's level of consciousness, motor function, sensory function, and level of assistance needed for ADLs.  Monitor and report changes from baseline. Collaborate with interdisciplinary team to initiate plan and implement interventions as ordered.   Outcome: Progressing     Problem: Activity Intolerance/Impaired Mobility  Goal: Mobility/activity is maintained at optimum level for patient  Description: Assess and monitor patient  barriers to mobility and need for assistive/adaptive devices. Assess patient's emotional response to limitations. Collaborate with interdisciplinary team and initiate plans and interventions as ordered.  Outcome: Progressing     Problem: Communication Impairment  Goal: Ability to express needs and understand communication  Description: Assess patient's communication skills and ability to understand information.  Patient will demonstrate use of effective communication techniques, alternative methods of communication and understanding even if not able to speak.   Outcome: Progressing     Problem: Potential for Aspiration  Goal: Non-ventilated patient's risk of aspiration is minimized  Description: Assess and monitor vital signs, respiratory status, and labs (WBC).  Monitor for signs of aspiration (tachypnea, cough, rales, wheezing, cyanosis, fever).  Outcome: Progressing     Problem: Knowledge Deficit  Goal: Patient/family/caregiver demonstrates understanding of disease process, treatment plan, medications, and discharge instructions  Description: Complete learning assessment and assess knowledge base.  Outcome: Progressing     Problem: Potential for Compromised Skin Integrity  Goal: Skin integrity is maintained or improved  Description: Assess and monitor skin integrity. Identify patients at risk for skin breakdown on admission and per policy. Collaborate with interdisciplinary team and initiate plans and interventions as needed.  Outcome:  Progressing  Goal: Nutritional status is improving  Description: Monitor and assess patient for malnutrition (ex- brittle hair, bruises, dry skin, pale skin and conjunctiva, muscle wasting, smooth red tongue, and disorientation). Collaborate with interdisciplinary team and initiate plan and interventions as ordered.  Monitor patient's  weight and dietary intake as ordered or per policy. Utilize nutrition screening tool and intervene per policy. Determine patient's food preferences and provide high-protein, high-caloric foods as appropriate.   Outcome: Progressing     Problem: Incontinence  Goal: Perineal skin integrity is maintained or improved  Description: Assess genitourinary system, perineal skin, labs (urinalysis), and history of incontinence to include past management, aggravating, and alleviating factors.  Collaborate with interdisciplinary team and initiate plans and interventions as needed.  Outcome: Progressing

## 2021-04-01 NOTE — Unmapped (Signed)
[facility] in stable condition to new room. CMU placed and bed alarm on.

## 2021-04-02 MED ORDER — cloNIDine (CATAPRES) 0.2 mg/24 hr patch 1 patch
0.2 | TRANSDERMAL
Start: 2021-04-02 — End: 2021-04-06
  Administered 2021-04-02: 17:00:00 1 via TRANSDERMAL

## 2021-04-02 MED FILL — VALSARTAN 80 MG TABLET: 80 80 MG | ORAL | Qty: 1

## 2021-04-02 MED FILL — ASPIRIN 81 MG CHEWABLE TABLET: 81 81 MG | ORAL | Qty: 1

## 2021-04-02 MED FILL — CLONIDINE 0.2 MG/24 HR WEEKLY TRANSDERMAL PATCH: 0.2 0.2 mg/24 hr | TRANSDERMAL | Qty: 1

## 2021-04-02 MED FILL — HEPARIN (PORCINE) 5,000 UNIT/ML INJECTION SOLUTION: 5000 5,000 unit/mL | INTRAMUSCULAR | Qty: 1

## 2021-04-02 MED FILL — NICOTINE 14 MG/24 HR DAILY TRANSDERMAL PATCH: 14 14 mg/24 hr | TRANSDERMAL | Qty: 1

## 2021-04-02 MED FILL — TAMSULOSIN 0.4 MG CAPSULE: 0.4 0.4 mg | ORAL | Qty: 1

## 2021-04-02 MED FILL — ATORVASTATIN 80 MG TABLET: 80 80 MG | ORAL | Qty: 1

## 2021-04-02 MED FILL — ARIPIPRAZOLE 5 MG TABLET: 5 5 MG | ORAL | Qty: 1

## 2021-04-02 MED FILL — CLOPIDOGREL 75 MG TABLET: 75 75 mg | ORAL | Qty: 1

## 2021-04-02 NOTE — Unmapped (Signed)
[facility]   Social Work Stroke Assessment     ADMIR CANDELAS  [id number]  66 y.o.  male  Black or African American    Cerebrovascular accident (CVA) due to occlusion of left middle cerebral artery (CMS-HCC) [I63.512]  Acute ischemic left middle cerebral artery (MCA) stroke (CMS-HCC) [I63.512]    Additional Stroke Assessment    Who typically provides your care at home and what are their ages?: None    Care Provider Comments: Independent prior to admission    How many hours a day can your caregiver provide?: As much as needed (lives with significant other, family can assist as needed)    Do you think that the above care provider(s) can continue to care for you after discharge?: Yes    Do you think that the above care provider can physically assist you with your activities of daily living?: Yes    If a rehabilitation facility post hospitalization is indicated do you know what to expect post rehabilitation facility discharge?: Yes    Do you have the necessary equipment at home to provide a safe environment?: Yes    Equipment patient has at home:: Cane walker    Do you have a plan in place so that you can obtain your prescribed medications?: Yes    Who will be picking up your medications?: Spouse/Significant Other    Do you see any barriers to obtaining your prescribed medications?: No    Do you have transportation to and from needed follow-up appointments?: Yes    Who will be assisting you with transportation needs?: Significant Other     Do you feel your needs have been addressed regarding planning of post hospitalization?: Yes    Do you agree with your discharge plan of care?: Yes     Quay Burow MSN, RN CM  [phone]

## 2021-04-02 NOTE — Unmapped (Signed)
[facility]  Neurology Department  Daily Progress Note    Chief Complaint / Brief hospital summary     [name] is a 66 year old amle with PMH of L MCA strokes (most recent 03/18/21), HTN, and T2DM, who presented with acute onset of recurrent expressive aphasia. He was admitted to the neurology inpatient service for acute ischemic stroke.     Interval History / Subjective     Patient refused CMU and was refusing medications overnight. Not taking medications today. BP 130s-150s. Patient saying no repeatedly.     Review of Systems (Focused)     Unable to assess ROS due to aphasia    Medications     See MAR    Vital Signs / Intake & Output     Temp:  [98 ??F (36.7 ??C)-99.1 ??F (37.3 ??C)] 98.2 ??F (36.8 ??C)  Heart Rate:  [71-114] 114  Resp:  [18-24] 18  BP: (137-155)/(83-94) 137/83      Intake/Output Summary (Last 24 hours) at 04/02/2021 0835  Last data filed at 04/02/2021 0600  Gross per 24 hour   Intake 600 ml   Output 1650 ml   Net -1050 ml       Physical Exam     Physical Exam:  General Exam: Pt in NAD  HEENT: NCAT, no nuchal rigidity  CV: RRR, no edema  Lungs: Breathing comfortably on room air, no wheezing  Abd: Soft, NT    Neurologic Physical Exam:    Cognitive: Alert and awake, eating this morning. Seems angry today.  Language/Speech: Saying only no, able to say alright. Unable to follow simple commands. Unable to answer questions or answer orientation questions    CN:   CN II: visual field intact to confrontation  CN III/IV/VI: Extraocular movements intact and pupils equal, round, reactive to light   CN V: Facial sensation grossly  CN VII: facial motion with slight right nasolabial fold  CN VIII: Hearing intact to voice  CN IX/X: palate elevation symmetric   CN XI: trapezius 5/5 symmetric strength  CN XII: Tongue protrudes midline    Motor: Normal tone throughout, no atrophy present    Strength: upper extremities strong 5/5 bilateral, good grip and bicep and tricep bilaterally 5/5.  Would not move legs for me this AM, wiggles toes bilaterally    Sensory: intact diffusely to light touch bilaterally    Deep Tendon Reflexes:    R L   Biceps 3+ 2+   Triceps - -   Brachioradialis 3+ 2+   Patellar 2+ 2+   Achilles 2+  2+   Toes         Coordination/Cerebellum: No dysmetria, dysdiadochokinesia, or ataxia noted; no tremor noted, unable to perform finger to nose    Gait: not tested this AM    Laboratory Data     Reviewed in Epic     Diagnostic Studies     CT Angio Neck W and or WO    Addendum Date: 03/29/2021    ** ADDENDUM #1** Following availability of perfusion imaging the CTA was reevaluated. There is Complete occlusion of the left posterior M2 segment in the peri-insular sulcus. There is poor collateral flow. These findings correspond to known perfusion defect Approved by Helane Gunther, MD on 03/28/2021 4:52 PM EST I have personally reviewed the images and I agree with this report. Report Verified by: Leda Roys, MD at 03/29/2021 9:04 AM EST** ORIGINAL REPORT ** EXAM: CT ANGIOGRAPHY HEAD W &/  OR WO WITH VIZ LVO EXAM: CT ANGIO NECK W AND OR WO CONTRAST INDICATION: Neuro deficit, acute, stroke suspected TECHNIQUE: Axial thin section CT angiography of the neck and head was performed with 80 mL of IOHEXOL 350 MG IODINE/ML INTRAVENOUS SOLUTION administered intravenously (accession [id number]), 80 mL of IOHEXOL 350 MG IODINE/ML INTRAVENOUS SOLUTION administered intravenously (accession [id number]). 3-D reconstructions of the cervical and cranial vessels were performed on a separate 3-D workstation, including MIP, curved MPR, and VRT images utilizing a radiologist approved protocol and were interpreted along with source images. Image data from the CTA was sent to [email or web address] for LVO analysis. This analysis was sent to the stroke team for clinical use.  COMPARISON: None available.  FINDINGS: The cervical portion of the exam is limited by motion artifact. Pulmonary arteries: No included emboli.  Arch anatomy and vessel origins: Standard three-vessel arch anatomy. Patent innominate, bilateral common carotid, subclavian, and vertebral artery origins. Right carotid system: Normal common, external, and internal carotid arteries. Left carotid system: Normal common, external, and internal carotid arteries. Right vertebral artery: Normal. Left vertebral artery:  Normal. Intracranial posterior circulation: Normal bilateral V4 segments.  The basilar artery enhances normally. Posterior cerebral arteries enhance normally. Intracranial anterior circulation: Normal cavernous and supraclinoid internal carotids. The bilateral middle cerebral arteries and branches enhance normally. The bilateral anterior cerebral arteries enhance normally. Venous structures: Venous structures in the head are fairly well opacified in the arterial phase with no significant abnormality. Extravascular findings: No acute orbital abnormality. Clear paranasal sinuses. Clear mastoid air cells. No included neck mass or adenopathy. Normal intracranial structures. No compressive abnormality of the cervical spine. No suspicious lung nodules included. 3D reconstructions of the neck vessels including MIP and curved MPR images reconstructed on a separate 3D workstation show no evidence of dissection or pseudoaneurysm. 3D reconstructions of the cranial vessels including MIP and VRT images reconstructed on a separate 3D workstation show no evidence of intracranial aneurysm, stenosis, or AV shunting lesion. No occlusive change is seen. IMPRESSION: Neck 1.  No acute arterial abnormality Head 1.  No acute arterial abnormality Report Verified by: Leda Roys, MD at 03/28/2021 10:38 AM EST    Result Date: 03/29/2021  IMPRESSION: Neck 1.  No acute arterial abnormality Head 1.  No acute arterial abnormality Report Verified by: Leda Roys, MD at 03/28/2021 10:38 AM EST    MRI Head WO contrast    Result Date: 03/31/2021  IMPRESSION: 1.  Acute infarct  involving the left parietal-temporal region. Left M2 occlusion with abnormal flow void is redemonstrated. 2.  Interval decrease in spotty diffusion signal in the left occipital lobe related to evolving subacute infarct. CRITICAL RESULT: Acute infarct involving the left parietal-temporal region.  This finding was discussed with Joya Martyr, MD on  03/31/2021 2:48 AM EST by telephone. They confirmed that they understood the findings communicated to them.  #[id number]# Approved by Ephriam Jenkins, MD on 03/31/2021 2:59 AM EST I have personally reviewed the images and I agree with this report. Report Verified by: Charlean Sanfilippo, MD at 03/31/2021 3:20 AM EST    MRI Head WO contrast    Addendum Date: 03/20/2021    ** ADDENDUM #1** The impression should read: IMPRESSION: 1. Recent Left parieto-occipital infarct. 2. No intracranial mass or hemorrhage. Report Verified by: Burt Ek, MD at 03/20/2021 12:19 PM EST** ORIGINAL REPORT ** EXAM: MRI BRAIN WO CONTRAST INDICATION: Stroke, follow up TECHNIQUE:  MRI brain performed including multiplanar T1 and T2 weighted sequences. COMPARISON: CT 03/18/2021 FINDINGS:  Ventricles unchanged, borderline in size. No hematoma. Small microbleed right pons. Area of 4 x 1.5 x 2 cm cortical diffusion restriction left parieto-occipital. Bilateral symmetric foci small and confluent areas of T2/FLAIR hyperintensity cerebral hemisphere white matter bilaterally, thalami, pons, midbrain with multiple  lacunar changes. Prominent perivascular spaces throughout hemisphere white matter. No large artery or major dural sinus flow signal abnormality. Orbits and paranasal sinuses unremarkable. Temporal bones exhibit bilateral mastoid fluid effusion. IMPRESSION: 1.  Normal MRI brain without contrast. 2.  No intracranial mass, signal abnormality, or hemorrhage. Report Verified by: Burt Ek, MD at 03/20/2021 8:41 AM EST    Result Date: 03/20/2021  IMPRESSION: 1.  Normal MRI brain without contrast. 2.  No intracranial  mass, signal abnormality, or hemorrhage. Report Verified by: Burt Ek, MD at 03/20/2021 8:41 AM EST    Code Stroke-CT Head WO contrast    Result Date: 03/28/2021  IMPRESSION: 1.  Evolving left MCA distribution infarct in the general region of diffusion restriction on MRI 03/19/2021. If there is concern for progressive infarction, MRI would be more sensitive for further evaluation. 2.  No intracranial mass effect or definite hemorrhage. 3.  Chronic white matter disease and remote left basal ganglia lacunar infarct. Report Verified by: Veto Kemps, MD at 03/28/2021 10:02 AM EST    CT Cerebral Perfusion With IV contrast    Result Date: 03/28/2021  IMPRESSION: 1.  moderate area of hypoperfusion in the left MCA territory with a central ischemic core of 25 mL and a 48 mL penumbra. Report Verified by: Alfredo Bach, DO at 03/28/2021 11:13 AM EST    CT Angio Head W and or WO WITH VIZ LVO    Addendum Date: 03/29/2021    ** ADDENDUM #1** Following availability of perfusion imaging the CTA was reevaluated. There is Complete occlusion of the left posterior M2 segment in the peri-insular sulcus. There is poor collateral flow. These findings correspond to known perfusion defect Approved by Helane Gunther, MD on 03/28/2021 4:52 PM EST I have personally reviewed the images and I agree with this report. Report Verified by: Leda Roys, MD at 03/29/2021 9:04 AM EST** ORIGINAL REPORT ** EXAM: CT ANGIOGRAPHY HEAD W &/OR WO WITH VIZ LVO EXAM: CT ANGIO NECK W AND OR WO CONTRAST INDICATION: Neuro deficit, acute, stroke suspected TECHNIQUE: Axial thin section CT angiography of the neck and head was performed with 80 mL of IOHEXOL 350 MG IODINE/ML INTRAVENOUS SOLUTION administered intravenously (accession [id number]), 80 mL of IOHEXOL 350 MG IODINE/ML INTRAVENOUS SOLUTION administered intravenously (accession [id number]). 3-D reconstructions of the cervical and cranial vessels were performed on a separate 3-D workstation,  including MIP, curved MPR, and VRT images utilizing a radiologist approved protocol and were interpreted along with source images. Image data from the CTA was sent to [email or web address] for LVO analysis. This analysis was sent to the stroke team for clinical use.  COMPARISON: None available.  FINDINGS: The cervical portion of the exam is limited by motion artifact. Pulmonary arteries: No included emboli. Arch anatomy and vessel origins: Standard three-vessel arch anatomy. Patent innominate, bilateral common carotid, subclavian, and vertebral artery origins. Right carotid system: Normal common, external, and internal carotid arteries. Left carotid system: Normal common, external, and internal carotid arteries. Right vertebral artery: Normal. Left vertebral artery:  Normal. Intracranial posterior circulation: Normal bilateral V4 segments.  The basilar artery enhances normally. Posterior cerebral arteries enhance normally. Intracranial anterior circulation: Normal cavernous and supraclinoid internal carotids. The bilateral middle cerebral arteries and branches enhance normally. The  bilateral anterior cerebral arteries enhance normally. Venous structures: Venous structures in the head are fairly well opacified in the arterial phase with no significant abnormality. Extravascular findings: No acute orbital abnormality. Clear paranasal sinuses. Clear mastoid air cells. No included neck mass or adenopathy. Normal intracranial structures. No compressive abnormality of the cervical spine. No suspicious lung nodules included. 3D reconstructions of the neck vessels including MIP and curved MPR images reconstructed on a separate 3D workstation show no evidence of dissection or pseudoaneurysm. 3D reconstructions of the cranial vessels including MIP and VRT images reconstructed on a separate 3D workstation show no evidence of intracranial aneurysm, stenosis, or AV shunting lesion. No occlusive change is seen. IMPRESSION: Neck 1.  No acute  arterial abnormality Head 1.  No acute arterial abnormality Report Verified by: Leda Roys, MD at 03/28/2021 10:38 AM EST    Result Date: 03/29/2021  IMPRESSION: Neck 1.  No acute arterial abnormality Head 1.  No acute arterial abnormality Report Verified by: Leda Roys, MD at 03/28/2021 10:38 AM EST        Assessment & Plan     Acute ischemic stroke due to left MCA (M2) occlusion  ??? Sx:??Woke up with acute onset of aphasia at approx. 0800 on 03/28/21, LKN approx. 2300 on 03/27/21. Initial NIHSS 6.  ??? CT:??Evolution of stroke in similar territory to prior from 03/18/21, L MCA.  ??? CTA:??Left M2 occlusion.  ??? CTP:??25??mL ischemic??core and 48 mL penumbra  ??? MRI: Acute infarct of L parietal-temporal region. L M2 occlusion with abnormal flow void. Interval decrease in spotty diffusion signal in L occipital lobe (evolving subacute infarct).  ??? TTE: LVEF 65-60%, no RWMA, G1DD, normal LA, no shunt/thrombus.  ??? Basic stroke risk labs:??A1C??5.8% (03/20/2021); TSH??1.03??(03/20/2021).??LDL 116    Most consistent with large vessel etiology   -continue aspirin 81mg  and plavix 75mg  daily  -home lipitor 80mg  nightly  -SBP 120-150  -PT/OT/SLP IPR dysphagia 1 diet  -Telemetry  -agitation seroquel 25mg  PRN    #HTN  Home regimen: clonidine 0.2mg  BID, diltiazem 240mg  dialy, hydralazine 25mg  BID, losartan 50mg  daily. Unclear when these were last taken.   -valsartan 80mg  daily  -Clonidine 0.1mg  patch increase to 0.2mg  patch  -SBP 120-150    #T2DM  A1C at goal    #Depression  Continue Abilify 5mg  daily    #Urinary retention  Continue flomax 0.4mg  nightly     Checklist:  Fluids: none  Electrolytes: none  Nutrition: dysphagia 1  Telemetry: yes  DVT PPx: SQH  GI PPx: none  Restraints: none  Lines: PIV  Catheter: none  Labs: daily if able  Family: updated today    Disposition: IPR  Code Status: Full Code    Signed:  Ronalee Red, MD PhD  Neurology Resident Physician, PGY-2  04/02/2021 8:35 AM

## 2021-04-02 NOTE — Unmapped (Signed)
Speech Language Pathology   Reason Patient Not Seen     Name: [name]  DOB: 30-Nov-1955  Attending Physician: Leslye Peerobert Stanton, MD  Admission Diagnosis: Cerebrovascular accident (CVA) due to occlusion of left middle cerebral artery (CMS-HCC) [I63.512]  Acute ischemic left middle cerebral artery (MCA) stroke (CMS-HCC) [Z61.096][I63.512]  Date: 04/02/2021  Precautions: Precautions: none  Reviewed Pertinent hospital course: Yes      Pt declines therapy at this time. SLP will follow up as schedule permits and as appropriate.    Time Attempted: 1050        Arman BogusMaria Keimani Laufer, MS, CCC-SLP  Speech Language Pathologist  Registered MBSImP Clinician  [facility] Certified  Rehabilitation Services

## 2021-04-02 NOTE — Unmapped (Addendum)
Patient refusing medications, threatening staff to beat them up, shaking fist at staff and cursing. Nettie NT 2 made aware.     This RN and two other staff members attempted to give patient's medications again around noon. Patient shouting and shaking fist at every RN. MD made aware and to bedside to try to administer medications. Patient presenting with same behavior towards MD. Medications not administered. Patient did allow nursing to place Clonidine patch on L arm. Previous patch not found on patient to discard. Patient reluctant to let nursing search rest of body for previous patch but was not in previously charted location.       Patient took medications for male MD this afternoon.      1742- patient impulsive and jumping out of bed. Patient situated in chair with chair alarm on and call light within reach. Refusing to get back in bed.

## 2021-04-02 NOTE — Unmapped (Signed)
Problem: Neurological Deficit  Goal: Neurological status is stable or improving  Description: Monitor and assess patient's level of consciousness, motor function, sensory function, and level of assistance needed for ADLs.  Monitor and report changes from baseline. Collaborate with interdisciplinary team to initiate plan and implement interventions as ordered.   Outcome: Progressing     Problem: Communication Impairment  Goal: Ability to express needs and understand communication  Description: Assess patient's communication skills and ability to understand information.  Patient will demonstrate use of effective communication techniques, alternative methods of communication and understanding even if not able to speak.   Outcome: Progressing     Problem: Potential for Aspiration  Goal: Non-ventilated patient's risk of aspiration is minimized  Description: Assess and monitor vital signs, respiratory status, and labs (WBC).  Monitor for signs of aspiration (tachypnea, cough, rales, wheezing, cyanosis, fever).  Outcome: Progressing     Problem: Knowledge Deficit  Goal: Patient/family/caregiver demonstrates understanding of disease process, treatment plan, medications, and discharge instructions  Description: Complete learning assessment and assess knowledge base.  Outcome: Progressing

## 2021-04-02 NOTE — Unmapped (Addendum)
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    PCP:  Vevelyn Francois, DO    Home Pharmacy:    [facility] [address], [place] - [address]  [address] [place] [address]  Phone: [phone]     [facility]  [address]  [place] [address]  Phone: [phone]         Medical Insurance Coverage:  Payor: UNITED [employer] MANAGED MEDICARE / Plan: [facility] MEDICARE HMO COMPLETE / Product Type: Medicare Mngd Care /     Other Pertinent Information     RN Case Manager participated in interdisciplinary rounds with the MD team and completed chart review. Per team, working on plan for blood pressure control, anticipate may be medically ready for discharge tomorrow, restraints d/c'd 3/3 at 1000 AM. Patient accepted by [facility] for inpatient rehab, pre-cert started on 3/3, RN CM called Stacey/[facility] [phone]), left voicemail requesting update on pre-cert.     1100 - Liaison/Stacey came to RN CM's office, stated pre-cert is still pending, will update when she hears back from insurance.     Discharge Plan     Anticipated discharge plan:  Inpatient rehab, [facility]     Anticipated discharge date:  1-2 days     CM/SW will continue to follow and remain available for discharge planning needs.      Quay Burow, RN CM     Cell [phone]

## 2021-04-02 NOTE — Unmapped (Signed)
Pt has refused his tele, spo2 monitor and IV insertion after multiple attempts from staff. Pt would yell at staff and move close aggressively. Resident on-call and CMU made aware.

## 2021-04-02 NOTE — Unmapped (Signed)
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     Time Attempted: 0831

## 2021-04-03 MED FILL — ATORVASTATIN 80 MG TABLET: 80 80 MG | ORAL | Qty: 1

## 2021-04-03 MED FILL — HEPARIN (PORCINE) 5,000 UNIT/ML INJECTION SOLUTION: 5000 5,000 unit/mL | INTRAMUSCULAR | Qty: 1

## 2021-04-03 MED FILL — ASPIRIN 81 MG CHEWABLE TABLET: 81 81 MG | ORAL | Qty: 1

## 2021-04-03 MED FILL — ARIPIPRAZOLE 5 MG TABLET: 5 5 MG | ORAL | Qty: 1

## 2021-04-03 MED FILL — CLOPIDOGREL 75 MG TABLET: 75 75 mg | ORAL | Qty: 1

## 2021-04-03 MED FILL — NICOTINE 14 MG/24 HR DAILY TRANSDERMAL PATCH: 14 14 mg/24 hr | TRANSDERMAL | Qty: 1

## 2021-04-03 MED FILL — VALSARTAN 80 MG TABLET: 80 80 MG | ORAL | Qty: 1

## 2021-04-03 NOTE — Unmapped (Addendum)
  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    PCP:  Vevelyn Francois, DO    Home Pharmacy:    [facility] [address] [place] - [address]  [address] [place] [address]  Phone: [phone]     [facility]  [address]  [place] [address]  Phone: [phone]         Medical Insurance Coverage:  Payor: UNITED HEALTHCARE MANAGED MEDICARE / Plan: [facility] MEDICARE HMO COMPLETE / Product Type: Medicare Mngd Care /     Other Pertinent Information     RN Case Manager participated in interdisciplinary rounds with the MD team and completed chart review. Per team, patient is medically ready for discharge. Patient accepted at [facility] for inpatient rehab, pre-cert started 3/3. RN CM received call from Shannon/[facility] [phone]), stated insurance is asking for additional therapy notes, and patient refused to work with OT or SLP yesterday, need updated PT/OT and SLP notes for pre-cert. RN CM notified team and requested they explain the necessity of working with therapy for discharge, sent message to PT/OT and SLP requesting updated notes for pre-cert.      1120 - PT/OT have already worked with patient, PT note is signed and OT note pending. SLP/Schwab notified RN CM patient has agreed to work with her, and her note will be in soon. RN CM called Shannon/[facility], notified patient has agreed and notes are pending, she already sent everthing else that insurance requested noting updated notes were pending, she will watch and send updated notes as soon as they are signed. RN CM updated team.     1420 - RN CM called Shannon/[facility], verified received updated notes and sent to insurance, pre-cert still pending.     Discharge Plan     Anticipated discharge plan:  Inpatient rehab, [facility]     Anticipated discharge date:  1-2 days     CM/SW will continue to follow and remain available for discharge planning needs.      Quay Burow, RN CM     Cell [phone]

## 2021-04-03 NOTE — Unmapped (Signed)
[facility]  Neurology Department  Daily Progress Note    Chief Complaint / Brief hospital summary     [name] is a 66 year old amle with PMH of L MCA strokes (most recent 03/18/21), HTN, and T2DM, who presented with acute onset of recurrent expressive aphasia. He was admitted to the neurology inpatient service for acute ischemic stroke.     Interval History / Subjective     Patient took medications last night and had uneventful night. BP 140s-150s.    Review of Systems (Focused)     Unable to assess ROS due to aphasia    Medications     See MAR    Vital Signs / Intake & Output     Temp:  [97.6 ??F (36.4 ??C)-98.7 ??F (37.1 ??C)] 98.7 ??F (37.1 ??C)  Heart Rate:  [93-114] 94  Resp:  [18] 18  BP: (137-156)/(83-98) 138/83      Intake/Output Summary (Last 24 hours) at 04/03/2021 0716  Last data filed at 04/03/2021 0700  Gross per 24 hour   Intake 550 ml   Output 300 ml   Net 250 ml       Physical Exam     Physical Exam:  General Exam: Pt in NAD  HEENT: NCAT, no nuchal rigidity  CV: RRR, no edema  Lungs: Breathing comfortably on room air, no wheezing  Abd: Soft, NT    Neurologic Physical Exam:    Cognitive: Alert and awake, eating this morning.   Language/Speech: a little more agreeable today. Unable to follow simple commands. Unable to answer questions or answer orientation questions    CN:   CN II: visual field intact to confrontation  CN III/IV/VI: Extraocular movements intact and pupils equal, round, reactive to light   CN V: Facial sensation grossly  CN VII: facial motion with slight right nasolabial fold  CN VIII: Hearing intact to voice  CN IX/X: palate elevation symmetric   CN XI: trapezius 5/5 symmetric strength  CN XII: Tongue protrudes midline    Motor: Normal tone throughout, no atrophy present    Strength: upper extremities strong 5/5 bilateral, good grip and bicep and tricep bilaterally 5/5. Wiggles toes bilaterally, moving legs bilaterally     Sensory: intact diffusely to light  touch bilaterally    Deep Tendon Reflexes:    R L   Biceps 3+ 2+   Triceps - -   Brachioradialis 3+ 2+   Patellar 2+ 2+   Achilles 2+  2+   Toes         Coordination/Cerebellum: No dysmetria, dysdiadochokinesia, or ataxia noted; no tremor noted, unable to perform finger to nose    Gait: not tested this AM    Laboratory Data     Reviewed in Epic     Diagnostic Studies     CT Angio Neck W and or WO    Addendum Date: 03/29/2021    ** ADDENDUM #1** Following availability of perfusion imaging the CTA was reevaluated. There is Complete occlusion of the left posterior M2 segment in the peri-insular sulcus. There is poor collateral flow. These findings correspond to known perfusion defect Approved by Helane Gunther, MD on 03/28/2021 4:52 PM EST I have personally reviewed the images and I agree with this report. Report Verified by: Leda Roys, MD at 03/29/2021 9:04 AM EST** ORIGINAL REPORT ** EXAM: CT ANGIOGRAPHY HEAD W &/OR WO WITH VIZ LVO EXAM: CT ANGIO NECK W AND OR WO CONTRAST INDICATION: Neuro  deficit, acute, stroke suspected TECHNIQUE: Axial thin section CT angiography of the neck and head was performed with 80 mL of IOHEXOL 350 MG IODINE/ML INTRAVENOUS SOLUTION administered intravenously (accession [id number]), 80 mL of IOHEXOL 350 MG IODINE/ML INTRAVENOUS SOLUTION administered intravenously (accession [id number]). 3-D reconstructions of the cervical and cranial vessels were performed on a separate 3-D workstation, including MIP, curved MPR, and VRT images utilizing a radiologist approved protocol and were interpreted along with source images. Image data from the CTA was sent to [email or web address] for LVO analysis. This analysis was sent to the stroke team for clinical use.  COMPARISON: None available.  FINDINGS: The cervical portion of the exam is limited by motion artifact. Pulmonary arteries: No included emboli. Arch anatomy and vessel origins: Standard three-vessel arch anatomy. Patent innominate, bilateral  common carotid, subclavian, and vertebral artery origins. Right carotid system: Normal common, external, and internal carotid arteries. Left carotid system: Normal common, external, and internal carotid arteries. Right vertebral artery: Normal. Left vertebral artery:  Normal. Intracranial posterior circulation: Normal bilateral V4 segments.  The basilar artery enhances normally. Posterior cerebral arteries enhance normally. Intracranial anterior circulation: Normal cavernous and supraclinoid internal carotids. The bilateral middle cerebral arteries and branches enhance normally. The bilateral anterior cerebral arteries enhance normally. Venous structures: Venous structures in the head are fairly well opacified in the arterial phase with no significant abnormality. Extravascular findings: No acute orbital abnormality. Clear paranasal sinuses. Clear mastoid air cells. No included neck mass or adenopathy. Normal intracranial structures. No compressive abnormality of the cervical spine. No suspicious lung nodules included. 3D reconstructions of the neck vessels including MIP and curved MPR images reconstructed on a separate 3D workstation show no evidence of dissection or pseudoaneurysm. 3D reconstructions of the cranial vessels including MIP and VRT images reconstructed on a separate 3D workstation show no evidence of intracranial aneurysm, stenosis, or AV shunting lesion. No occlusive change is seen. IMPRESSION: Neck 1.  No acute arterial abnormality Head 1.  No acute arterial abnormality Report Verified by: Leda RoysBill Hunter Boshell, MD at 03/28/2021 10:38 AM EST    Result Date: 03/29/2021  IMPRESSION: Neck 1.  No acute arterial abnormality Head 1.  No acute arterial abnormality Report Verified by: Leda RoysBill Hunter Boshell, MD at 03/28/2021 10:38 AM EST    MRI Head WO contrast    Result Date: 03/31/2021  IMPRESSION: 1.  Acute infarct involving the left parietal-temporal region. Left M2 occlusion with abnormal flow void is  redemonstrated. 2.  Interval decrease in spotty diffusion signal in the left occipital lobe related to evolving subacute infarct. CRITICAL RESULT: Acute infarct involving the left parietal-temporal region.  This finding was discussed with Joya Martyrobert Dance, MD on  03/31/2021 2:48 AM EST by telephone. They confirmed that they understood the findings communicated to them.  #[id number]# Approved by Ephriam JenkinsMouhamed Diop, MD on 03/31/2021 2:59 AM EST I have personally reviewed the images and I agree with this report. Report Verified by: Charlean SanfilippoNick Luibrand, MD at 03/31/2021 3:20 AM EST    Code Stroke-CT Head WO contrast    Result Date: 03/28/2021  IMPRESSION: 1.  Evolving left MCA distribution infarct in the general region of diffusion restriction on MRI 03/19/2021. If there is concern for progressive infarction, MRI would be more sensitive for further evaluation. 2.  No intracranial mass effect or definite hemorrhage. 3.  Chronic white matter disease and remote left basal ganglia lacunar infarct. Report Verified by: Veto KempsGavin Udstuen, MD at 03/28/2021 10:02 AM EST    CT Cerebral Perfusion With IV contrast  Result Date: 03/28/2021  IMPRESSION: 1.  moderate area of hypoperfusion in the left MCA territory with a central ischemic core of 25 mL and a 48 mL penumbra. Report Verified by: Alfredo Bach, DO at 03/28/2021 11:13 AM EST    CT Angio Head W and or WO WITH VIZ LVO    Addendum Date: 03/29/2021    ** ADDENDUM #1** Following availability of perfusion imaging the CTA was reevaluated. There is Complete occlusion of the left posterior M2 segment in the peri-insular sulcus. There is poor collateral flow. These findings correspond to known perfusion defect Approved by Helane Gunther, MD on 03/28/2021 4:52 PM EST I have personally reviewed the images and I agree with this report. Report Verified by: Leda Roys, MD at 03/29/2021 9:04 AM EST** ORIGINAL REPORT ** EXAM: CT ANGIOGRAPHY HEAD W &/OR WO WITH VIZ LVO EXAM: CT ANGIO NECK W AND OR WO CONTRAST  INDICATION: Neuro deficit, acute, stroke suspected TECHNIQUE: Axial thin section CT angiography of the neck and head was performed with 80 mL of IOHEXOL 350 MG IODINE/ML INTRAVENOUS SOLUTION administered intravenously (accession [id number]), 80 mL of IOHEXOL 350 MG IODINE/ML INTRAVENOUS SOLUTION administered intravenously (accession [id number]). 3-D reconstructions of the cervical and cranial vessels were performed on a separate 3-D workstation, including MIP, curved MPR, and VRT images utilizing a radiologist approved protocol and were interpreted along with source images. Image data from the CTA was sent to [email or web address] for LVO analysis. This analysis was sent to the stroke team for clinical use.  COMPARISON: None available.  FINDINGS: The cervical portion of the exam is limited by motion artifact. Pulmonary arteries: No included emboli. Arch anatomy and vessel origins: Standard three-vessel arch anatomy. Patent innominate, bilateral common carotid, subclavian, and vertebral artery origins. Right carotid system: Normal common, external, and internal carotid arteries. Left carotid system: Normal common, external, and internal carotid arteries. Right vertebral artery: Normal. Left vertebral artery:  Normal. Intracranial posterior circulation: Normal bilateral V4 segments.  The basilar artery enhances normally. Posterior cerebral arteries enhance normally. Intracranial anterior circulation: Normal cavernous and supraclinoid internal carotids. The bilateral middle cerebral arteries and branches enhance normally. The bilateral anterior cerebral arteries enhance normally. Venous structures: Venous structures in the head are fairly well opacified in the arterial phase with no significant abnormality. Extravascular findings: No acute orbital abnormality. Clear paranasal sinuses. Clear mastoid air cells. No included neck mass or adenopathy. Normal intracranial structures. No compressive abnormality of the cervical spine.  No suspicious lung nodules included. 3D reconstructions of the neck vessels including MIP and curved MPR images reconstructed on a separate 3D workstation show no evidence of dissection or pseudoaneurysm. 3D reconstructions of the cranial vessels including MIP and VRT images reconstructed on a separate 3D workstation show no evidence of intracranial aneurysm, stenosis, or AV shunting lesion. No occlusive change is seen. IMPRESSION: Neck 1.  No acute arterial abnormality Head 1.  No acute arterial abnormality Report Verified by: Leda Roys, MD at 03/28/2021 10:38 AM EST    Result Date: 03/29/2021  IMPRESSION: Neck 1.  No acute arterial abnormality Head 1.  No acute arterial abnormality Report Verified by: Leda Roys, MD at 03/28/2021 10:38 AM EST        Assessment & Plan     Acute ischemic stroke due to left MCA (M2) occlusion  ??? Sx:??Woke up with acute onset of aphasia at approx. 0800 on 03/28/21, LKN approx. 2300 on 03/27/21. Initial NIHSS 6.  ??? CT:??Evolution of stroke in similar territory to  prior from 03/18/21, L MCA.  ??? CTA:??Left M2 occlusion.  ??? CTP:??25??mL ischemic??core and 48 mL penumbra  ??? MRI: Acute infarct of L parietal-temporal region. L M2 occlusion with abnormal flow void. Interval decrease in spotty diffusion signal in L occipital lobe (evolving subacute infarct).  ??? TTE: LVEF 65-60%, no RWMA, G1DD, normal LA, no shunt/thrombus.  ??? Basic stroke risk labs:??A1C??5.8% (03/20/2021); TSH??1.03??(03/20/2021).??LDL 116  ??? Attempted thrombectomy, unable to get to Left M2 due to stenosis    Most consistent with large vessel etiology   -continue aspirin 81mg  and plavix 75mg  daily  -home lipitor 80mg  nightly  -SBP 120-150  -PT/OT/SLP IPR dysphagia 1 diet, will reinforce that he needs to work with therapy  -Telemetry    #HTN  Home regimen: clonidine 0.2mg  BID, diltiazem 240mg  dialy, hydralazine 25mg  BID, losartan 50mg  daily. Unclear when these were last taken. A little tachycardic, possibly due to poor PO  intake  -valsartan 80mg  daily  -Clonidine 0.2mg  patch  -SBP 120-150  -consider fluids if poor PO intake    #T2DM  A1C at goal    #Depression  Continue Abilify 5mg  daily    #Urinary retention  Continue flomax 0.4mg  nightly     Checklist:  Fluids: none  Electrolytes: none  Nutrition: dysphagia 1  Telemetry: yes  DVT PPx: SQH  GI PPx: none  Restraints: none  Lines: PIV  Catheter: none  Labs: daily if able  Family: updated today    Disposition: IPR  Code Status: Full Code    Signed:  Ronalee Red, MD PhD  Neurology Resident Physician, PGY-2  04/03/2021 7:16 AM

## 2021-04-03 NOTE — Unmapped (Signed)
Speech Language Pathology  Progress Note      Name: [name]  DOB: 1955/09/21  Attending Physician: Leslye Peer, MD  Admission Diagnosis: Cerebrovascular accident (CVA) due to occlusion of left middle cerebral artery (CMS-HCC) [I63.512]  Acute ischemic left middle cerebral artery (MCA) stroke (CMS-HCC) [I63.512]  Date: 04/03/2021  Precautions: None  Reviewed Pertinent hospital course: Yes  Hospital Course SLP: 66 y.o. male presented with acute onset of expressive aphasia and admitted for acute ischemic stroke. HCT (3/1): Evolving left MCA distribution infarct in the general region of diffusion restriction on MRI 03/19/2021. No intracranial mass effect or definite hemorrhage. Chronic white matter disease and remote left basal ganglia lacunar infarct. CT Cerebral Perfusion (3/1): moderate area of hypoperfusion in the left MCA territory with a central ischemic core of 25 mL and a 48 mL penumbra.  Relevant PMH: L MCA strokes (most recent 03/18/21), HTN, and T2DM.  SLP Hx- outpatient cognitive therapy in 2017.  Evaluated by SLP 03/19/21: mild receptive aphasia, cognitive-linguistic deficits, functional oropharyngeal swallowing, rx: Regular diet/Thin liquids.      Assessment: Pt continues to demonstrate oral dysphagia, with improved oral efficiency compared to initial assessment. Pt's poor dentition is also a factor into impaired mastication. Pt requires max assist to implement swallow strategies. Diet: Dysphagia 3 / Thin liquids.     Plan:   - Dysphagia 3 / Thin liquids  - SLP therapy 1-3x while inpatient  - SLP at discharge is recommended    Subjective:  Alert and Oriented x0 Pain denies pain    Objective:  Goals Short Term Dysphagia  Patient will demonstrate use of swallowing exercises: to improve oral efficiency with 75% accuracy mod cues  >>In progress. Max assist required for slow rate of intake, small bites/sips, alternating solids/liquids. Pt able to implement in 50% of opportunities with chips and water.    Time frame for goals to be met in: 04/06/2021  ??  Goals Long Term Dysphagia  Patient will utilize safe swallow behaviors to minimize risks of aspiration with: min assist  >>IN progress. Max assist required at this time with soft solids and thin liquids.   Time frame for goals to be met in: 04/13/2021    Education:  Patient educated on role of SLP, current POC, and discharge recommendations for SLP therapy.  Patient educated on swallowing anatomy & physiology, dysphagia, risks of aspiration, current diet recommendations, and safe swallowing behaviors.   Patient demonstrates understanding with cueing.       APHASIA    Assessment: Pt demonstrates severe nonfluent expressive and severe receptive aphasia. Pt's spontaneous speech is perseverative on no and jargon words. Visual occasionally help improve comprehension. Continued SLP intervention is indicated.     Plan:   - SLP therapy 1-3x while inpatient  - SLP at discharge is recommended    Subjective:  Alert and Oriented x0.  Pain denies pain    Objective:  Goals Language  Patient will demonstrate ability to indicate wants/needs: via verbal or nonverbal means with 90% accuracy mod cues  >>In progress. Gestures/pointing occasionally successful - 25% accuracy. Basic communication board, thumbs up/down not successful.   Patient will answer yes/no questions: with 75% accuracy mod cues  >>In progress. 20% accuracy max assist.  Patient will follow commands: 1-2 step with 75% accuracy mod cues  >>In progress. 1-step 50% accuracy improving to 75% with max cues. 0% 2-step.  Time frame for goals to be met in: 04/13/2021    Education:  Patient educated on role  of SLP, current POC, and discharge recommendations for SLP therapy.  Patient educated on expressive and receptive language, aphasia, and strategies to improve language.   Patient demonstrates understanding with cueing.     End of Session:  Patient was left in bed with call light within reach and all needs met.  Safety  handoff completed with RN.        Arman Bogus, MS, CCC-SLP  Speech Language Pathologist  Registered MBSImP Clinician  [facility] Certified  Rehabilitation Services       Time  Start Time: 1054  Stop Time: 1109  Time Calculation (min): 15 min  Charges   $Treatment: 1 Procedure  $Swallow Tx : 1 Procedure

## 2021-04-03 NOTE — Unmapped (Signed)
Occupational Therapy  Treatment     Name: [name]  DOB: 12-09-1955  Attending Physician: Leslye Peer, MD  Admission Diagnosis: Cerebrovascular accident (CVA) due to occlusion of left middle cerebral artery (CMS-HCC) [I63.512]  Acute ischemic left middle cerebral artery (MCA) stroke (CMS-HCC) [I63.512]  Date: 04/03/2021  Room: 1610/R6045  Reviewed Pertinent hospital course: Yes   Hospital Course PT/OT: 66 yo male presents to ED with altered speech. 3/1: HCT- evolving L MCA infarct, CTA- no acute findings, CTP- moderate area of hypoperfusion in the L MCA territory with a central ischemic core of 25 mL and a 48 mL penumbra, s/p cerebral angio- distal L M2 occlusion with severe stenosis and no intervention, 3/4: MRI -Acute infarct involving the left parietal-temporal region  Relevant PMH : L MCA stroke (L M3 occlusion s/p tPA in 2017), HTN, DMII, R parotid mass (pending excision), COPD  Precautions: none  Activity Level: Activity as tolerated      Recommendation  Recommendation: IP Rehab  Equipment Recommendations: Defer at this time    [name] initially agreeable to OT session. Patient request bed after functional mobility. Continues to demo significant R side neglect affecting his safety during all adl and mobility tasks. Patient will benefit from the intensity of an acute rehab stay with multiple skilled therapy disciplines. The patient will benefit significantly from and is able to tolerate extensive daily therapy (at least 3 hours per day). At this time the patient is below their baseline with functional mobility, requiring increased need for assistance and increased burden of care.    Assessment  Assessment: Decreased activity tolerance, Decreased Functional Mobility, Decreased Balance, Decreased ADL status, Visual deficit, Decreased fine motor control, Decreased UE strength, Decreased Safe judgment during ADL, Decreased self-care transfers, Decreased IADLs, Decreased cognition        Co-treatment  indicated: secondary to anticipated level of skilled assistance required to safely treat and mobilize patient       Prognosis for OT goals: Good                       Outcome Measures  AM-PAC 6 Clicks Daily Activity Inpatient Short Form: OT 6 Clicks Score: 16     Cognition  Overall Cognitive Status: Impaired  Arousal/Alertness: Alert  Orientation Level: Oriented to person (when asked patient to state name, unable to state. does turn to you when you say his name.)  Behavior: Agitated;Apprehensive  Following Commands: Requires increased time;Requires repetition of instruction (follows 50% of one step commands)  Safety Judgment: Impulsive;Impaired judgment;Decreased awareness of need for assistance;Decreased safety awareness  Comments: continues to present with significant R neglect during all activities/mobility.    Pain  Pain Score: 0 - No Pain  Therapist reported pain to: patient did not show any signs of pain during activity    Functional Mobility  Bed Mobility  Rolling: Supervision  Supine to Sit: Supervision  Sit to Supine: Supervision  Functional Transfers  Sit to Stand: Contact Guard assistance  Functional Mobility: Minimal assistance  Functional Mobility Comment: poor r side awareness, bumping into objects in room/hallway.  Balance  Sitting - Static: Supervision  Sitting - Dynamic: Supervision  Standing - Static: Contact Guard Assistance  Standing - Dynamic: Minimal Assistance    ADL  Feeding: Supervision  Feeding Deficit: Set-up  Feeding Deficit Additional Comments: able to drink water inb ed  Grooming Deficit Additional Comments: attempt for patient to perform task, defer and walked himself to his bed.  Toileting Deficit Additional  Comments: stated no for toilet       Position after Treatment/Safety Handoff  Position after therapy session: Bed  Details: RN notified;Call light/ needs within reach  Alarms: Bed  Alarms Status: Activated and Interfaced with call system    Goals    Goals to be met in: 1  week  Patient stated goal: to decrease pain during mobility and ADLs, to increase activity, to increase independence  Patient will complete supine to sit in prep for ADLs: Contact Guard assistance  Patient will complete functional chair transfer: Contact Guard assistance  Patient will complete toilet transfer: Will tolerate assessment  Patient will complete grooming task: Will tolerate assessment (standing sinkside)  Patient will complete upper body dressing: Contact Guard assistance  Patient will complete lower body dressing: Contact Guard assistance  Pt Will tolerate completeing ADLs with pain less than 4/10: .  Pt Will participate in upper extremity HEP to prep for ADLs: .  Miscellaneous Goal #1: Pt will demonstrate 3 instances of R body awareness within session with mod verbal cueing.  Miscellaneous Goal #2: Pt will participate in functional cognition assessment and upgrade  Long Term Goal : Pt will participate in IADL task assessment and upgrade  Long term goal to be met in: 2 weeks  Collaborated with: Patient    Plan  Plan  Treatment Interventions: ADL retraining, Activity Tolerance training, Energy Conservation, IADL retraining, VP retraining, Cognitive reorientation, Fine motor coordination activities, Patient/Family training, Functional transfer training, UE strengthening/ROM, Therapeutic Activity, Compensatory technique education  OT Frequency: minimum 3x/week  The plan of care and recommendations assesses the patient's and/or caregiver's readiness, willingness, and ability to provide or support functional mobility and ADL tasks as needed upon discharge.    Patient/Family Education  Educated patient on the role of occupational therapy, OT goals, OT plan of care, ADL training, functional mobility training and the importance of safety and fall prevention strategies including need for supervision/ assistance with OOB activity and use of call light. patient  needed cues, will need reinforcement and unable to  verbalize/demonstrate understanding.    OT Time  Start Time: 1031  Stop Time: 1039  Time Calculation (min): 8 min    OT Charges     $Therapeutic Activity: 8-22 mins           Problem List  Patient Active Problem List   Diagnosis   ??? Personal history of other specified diseases(V13.89)   ??? Gastroesophageal reflux disease without esophagitis   ??? Abnormal liver enzymes   ??? Hiatal hernia   ??? 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 Ischemic stroke (CMS-HCC)   ??? 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 Essential hypertension   ??? Type 2 diabetes mellitus with circulatory disorder (HCC)   ??? Chronic obstructive pulmonary disease (CMS-HCC)   ??? Spondylosis of cervical region without myelopathy or radiculopathy   ??? Screen for colon cancer   ??? Lumbar radiculopathy   ??? Cerebrovascular accident (CVA) due to occlusion of left middle cerebral artery (CMS-HCC)   ??? Acute ischemic left MCA stroke (CMS-HCC)     Past Medical History  Past Medical History:   Diagnosis Date   ??? COPD (chronic obstructive pulmonary disease) (CMS-HCC)    ??? Diabetes mellitus (CMS-HCC)    ??? Dysphagia    ??? GERD (gastroesophageal reflux disease)    ??? Hematuria    ???  Hepatitis    ??? Hypertension      Past Surgical History  Past Surgical History:   Procedure Laterality Date   ??? ESOPHAGOGASTRODUODENOSCOPY N/A 11/19/2013    Procedure: ESOPHAGOGASTRODUODENOSCOPY WITH MAC;  Surgeon: Charlane Ferretti, MD;  Location: [facility];  Service: Gastroenterology;  Laterality: N/A;   ??? FOOT SURGERY Left    ??? HERNIA REPAIR      about 5 yrs ago

## 2021-04-03 NOTE — Unmapped (Signed)
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 3/1: HCT- evolving L MCA infarct, CTA- no acute findings, CTP- moderate area of hypoperfusion in the L MCA territory with a central ischemic core of 25 mL and a 48 mL penumbra, s/p cerebral angio- distal L M2 occlusion with severe stenosis and no intervention, 3/4: MRI -Acute infarct involving the left parietal-temporal region  Relevant PMH : L MCA stroke (L M3 occlusion s/p tPA in 2017), HTN, DMII, R parotid mass (pending excision), COPD  Precautions: none  Activity Level: Activity as tolerated    Assessment  Jireh continues to require encouragement for participation in therapy due to his significant receptive and expressive deficits.  He demonstrates significant R neglect with L gaze preference affecting his overall safety with ambulation as he is unable to avoid obstacles on the right side.  He would continue to benefit from PT at Rehab upon discharge to maximize his functional independence.          Recommendation  Recommendation: IP Rehab  Equipment Recommended: Defer until further assessment         Outcome Measures  AM-PAC 6 Clicks Basic Mobility Inpatient Short Form: PT 6 Clicks Score: 18          Mobility Recommendations for Staff  Patient ability: Patient ambulates in hallway, Patient ambulates in room/ to bathroom  Assist needed: with 1 person assist    Cognition  Overall Cognitive Status: Impaired  Arousal/Alertness: Alert  Orientation Level: Oriented to person  Behavior: Apprehensive (and cooperative with MAX encouragement)  Following Commands: Follows  one step commands;Requires tactile cues;Requires demonstration;Requires repetition of instruction  Safety Judgment: Decreased safety awareness;Impaired judgment;Impulsive;Decreased awareness of need for assistance  Comments: Pt with continued significant R neglect noted throughout functional mobility     Pain  Pain Score: 0 - No Pain     Mobility  Bed Mobility  Supine to Sit: Stand-by assistance (after tactile cueing for initiation)  Sit to Supine: Stand-by assistance  Transfers  Sit to Stand: Contact Guard assistance (x 3 reps from EOB)  Stand to Sit: Contact Guard assistance  Gait  Distance: 150 ft  Level of Assistance: Minimal assistance  Assistive Device: None  Gait Characteristics: Unsteady;R decreased step length;L decreased step length;decreased cadence (with ZERO obstacle avoidance on the right side despite moderate cueing and poor awareness of running into obstacles on the right)  Unable to progress further due to: Pt refusing further  Balance  Sitting - Static: Supervision  Sitting - Dynamic: Stand by assistance  Standing - Static: Contact Guard Assistance  Standing - Dynamic: Minimal Assistance      Position after Treatment and Safety Handoff  Position after treatment and safety handoff  Position after therapy session: Bed  Details: RN notified;Call light/ needs within reach  Alarms: Bed  Alarms Status: Activated and interfaced unchanged from previous settings    Goals  Goals Met: Sit to stand, Bed mobility    Collaborated with: Patient  Patient Stated Goal: patient unable to state  Goals to be met by: 04/06/21  Patient will transition from supine to sit: Supervision  Patient  will transfer from sit to stand: Supervision  Patient will ambulate: Contact Guard assistance, distance (in feet)  Distance (in feet): 200 ft  Long-term goal to be met by: 04/13/21  Long Term Goal : Pt will ambulate 200 ft with 100 % obstacle on the right side    Patient/Family Education  Educated patient on the role of physical  therapy, goals, plan of care, importance of increased activity, discharge recommendations and gait training and fall prevention strategies, including need for supervision/ assistance with OOB activity and use of call light; patient needed cues and will need reinforcement. Handout(s) issued: none.    Plan  Plan  Treatment/Interventions: Patient/family training, Compensatory technique education, Gait training, Continued evaluation, Therapeutic Activity, Neuromuscular Reeducation  PT Frequency: minimum 3x/week    The plan of care and recommendations assesses the patient's and/or caregiver's readiness, willingness, and ability to provide or support functional mobility and ADL tasks as needed upon discharge.      Time  Start Time: 1030  Stop Time: 1039  Time Calculation (min): 9 min    Charges       $Gait/Mobility: 8-22 mins                   Problem List  Patient Active Problem List   Diagnosis   ??? Personal history of other specified diseases(V13.89)   ??? Gastroesophageal reflux disease without esophagitis   ??? Abnormal liver enzymes   ??? Hiatal hernia   ??? 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 Ischemic stroke (CMS-HCC)   ??? 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 Essential hypertension   ??? Type 2 diabetes mellitus with circulatory disorder (HCC)   ??? Chronic obstructive pulmonary disease (CMS-HCC)   ??? Spondylosis of cervical region without myelopathy or radiculopathy   ??? Screen for colon cancer   ??? Lumbar radiculopathy   ??? Cerebrovascular accident (CVA) due to occlusion of left middle cerebral artery (CMS-HCC)   ??? Acute ischemic left MCA stroke (CMS-HCC)        Past Medical History  Past Medical History:   Diagnosis Date   ??? COPD (chronic obstructive pulmonary disease) (CMS-HCC)    ??? Diabetes mellitus (CMS-HCC)    ??? Dysphagia    ??? GERD (gastroesophageal reflux disease)    ??? Hematuria    ??? Hepatitis    ???  Hypertension         Past Surgical History  Past Surgical History:   Procedure Laterality Date   ??? ESOPHAGOGASTRODUODENOSCOPY N/A 11/19/2013    Procedure: ESOPHAGOGASTRODUODENOSCOPY WITH MAC;  Surgeon: Charlane Ferretti, MD;  Location: [facility] ENDOSCOPY;  Service: Gastroenterology;  Laterality: N/A;   ??? FOOT SURGERY Left    ??? HERNIA REPAIR      about 5 yrs ago

## 2021-04-03 NOTE — Unmapped (Signed)
[facility]   Medical Nutrition Therapy  Follow-Up    Diet Order/Nutrition Support: Dysphagia Level 3: Advanced Thin (Pre-select please due to language impairment)    Chief Complaint: Acute ischemic left MCA stroke     Pertinent Information: [name] is a 66 year old amle with PMH of L MCA strokes (most recent 03/18/21), HTN, and T2DM, who presented with acute onset of recurrent expressive aphasia. He was admitted to the neurology inpatient service for acute ischemic stroke. Planning for pt to d/c to IPR pending placement per CC note.     SLP recommended Dysphagia 3 / Thin liquids following eval today. Diet advanced per SLP recommendations. Today, PO intake of 15-20% of meals, poor appetite. PO intake yesterday ranging 33-40% of meals with 100% x2 meals on 3/05. RD to add Boost Glucose Control, TID. +BM 3/07    LOS: 6 days    Scheduled Meds:   ??? ARIPiprazole  5 mg Oral Daily 0900   ??? aspirin  81 mg Oral Daily with breakfast   ??? atorvastatin  80 mg Oral Nightly (2100)   ??? cloNIDine  1 patch Transdermal Weekly   ??? clopidogreL  75 mg Oral Daily 0900   ??? heparin  5,000 Units Subcutaneous 3 times per day   ??? nicotine  1 patch Transdermal Daily 0900   ??? tamsulosin  0.4 mg Oral Nightly (2100)   ??? valsartan  80 mg Oral Daily 0900      Continuous Infusions:   PRN Meds:hydrALAZINE, nicotine **AND** nicotine (polacrilex), ondansetron     Pertinent Labs:   Lab Results   Component Value Date    CREATININE 1.07 04/01/2021    BUN 12 04/01/2021    NA 135 04/01/2021    K 3.5 04/01/2021    CL 101 04/01/2021    CO2 26 04/01/2021     Lab Results   Component Value Date    ALBUMIN 3.9 04/01/2021     Lab Results   Component Value Date    CALCIUM 9.4 04/01/2021    PHOS 3.1 04/01/2021     Lab Results   Component Value Date    MG 1.9 04/01/2021     Lab Results   Component Value Date    POCGMD 100 03/29/2021     Lab Results   Component Value Date    HGBA1C 5.8 (H) 03/20/2021       Weight History:  Wt Readings  from Last 5 Encounters:   03/19/21 180 lb (81.6 kg)   02/05/21 181 lb (82.1 kg)   01/12/21 177 lb (80.3 kg)   01/05/20 190 lb (86.2 kg)   03/01/19 198 lb (89.8 kg)     Established Estimated Nutrition Needs:  Needs based On: 81.6 kg - last known weight  Kcals/day: 1610-9604 (25-30 kcal/kg)  Protein g/day: 82-98 (1-1.2 g/kg)  Carbohydrate g/day: 45-55% of total calories  Fluid ml/day: ~1 ml/kcal or per MD    Established Nutrition Diagnosis  Nutrition Diagnosis: Inadequate oral intake  Related To: AMS, dysphagia  As Evidenced By: Need for modified diet texture     Established Nutrition Intervention: Monitor PO Intake/Tolerance  Established Goals: Total energy intake improved as evidenced by PO intake at least 50-75% of meals/supplements/snacks within 3-4 days  Goals: Partially Met    Additional Nutrition Related Problems  Not Applicable    Additional Recommended Interventions: Add/Change Medical Food Supplement/Snack and Monitor PO Intake/Tolerance  Additional Goals:Total energy intake improved as evidenced by PO intake  at least 50-75% of meals/supplements/snacks within 3-4 days    Nutrition Status Classification: Moderately Compromised    Discharge Planning and Education: Discharge plan of care for nutrition pending clinical course.    Follow up per nutrition services protocol while inpatient.    Additional Recommendation(s) to Physicians:   ?? RD to add Boost Glucose Control, TID   ?? Continue to monitor and encourage PO intake   ?? Recommend obtaining current weight as able    Clovis FredricksonJennifer Tamme Mozingo, RD, LD  Clinical Dietitian  Pager# [phone]

## 2021-04-04 LAB — RENAL FUNCTION PANEL W/EGFR
Albumin: 4.4 g/dL (ref 3.5–5.7)
Albumin: 3.9 g/dL (ref 3.5–5.7)
Anion Gap: 9 mmol/L (ref 3–16)
Anion Gap: 8 mmol/L (ref 3–16)
BUN: 20 mg/dL (ref 7–25)
BUN: 21 mg/dL (ref 7–25)
CO2: 24 mmol/L (ref 21–33)
CO2: 22 mmol/L (ref 21–33)
Calcium: 9.8 mg/dL (ref 8.6–10.3)
Calcium: 9.3 mg/dL (ref 8.6–10.3)
Chloride: 102 mmol/L (ref 98–110)
Chloride: 104 mmol/L (ref 98–110)
Creatinine: 1.32 mg/dL — ABNORMAL HIGH (ref 0.60–1.30)
Creatinine: 1.13 mg/dL (ref 0.60–1.30)
EGFR: 60
EGFR: 72
Glucose: 106 mg/dL — ABNORMAL HIGH (ref 70–100)
Glucose: 103 mg/dL (ref 70–100)
Osmolality, Calculated: 283 mosm/kg (ref 278–305)
Osmolality, Calculated: 281 mOsm/kg (ref 278–305)
Phosphorus: 4 mg/dL (ref 2.1–4.5)
Phosphorus: 3.9 mg/dL (ref 2.1–4.5)
Potassium: 4.5 mmol/L (ref 3.5–5.3)
Potassium: 4.4 mmol/L (ref 3.5–5.3)
Sodium: 135 mmol/L (ref 133–146)
Sodium: 134 mmol/L (ref 133–146)

## 2021-04-04 LAB — CBC
Hematocrit: 41.5 % (ref 38.5–50.0)
Hematocrit: 38.3 % (ref 38.5–50.0)
Hemoglobin: 14.5 g/dL (ref 13.2–17.1)
Hemoglobin: 13.5 g/dL (ref 13.2–17.1)
MCH: 31.7 pg (ref 27.0–33.0)
MCH: 31.9 pg (ref 27.0–33.0)
MCHC: 34.8 g/dL (ref 32.0–36.0)
MCHC: 35.2 g/dL (ref 32.0–36.0)
MCV: 91 fL (ref 80.0–100.0)
MCV: 90.7 fL (ref 80.0–100.0)
MPV: 8.4 fL (ref 7.5–11.5)
MPV: 8.4 fL (ref 7.5–11.5)
Platelets: 238 10*3/uL (ref 140–400)
Platelets: 229 10*3/uL (ref 140–400)
RBC: 4.56 10E6/uL (ref 4.20–5.80)
RBC: 4.22 10*6/uL (ref 4.20–5.80)
RDW: 13.8 % (ref 11.0–15.0)
RDW: 14 % (ref 11.0–15.0)
WBC: 7.9 10E3/uL (ref 3.8–10.8)
WBC: 7.1 10*3/uL (ref 3.8–10.8)

## 2021-04-04 LAB — MAGNESIUM
Magnesium: 2.1 mg/dL (ref 1.5–2.5)
Magnesium: 2 mg/dL (ref 1.5–2.5)

## 2021-04-04 MED ORDER — sterile water (PF) Soln 2.1 mL
Freq: Once | INTRAMUSCULAR | Status: AC | PRN
Start: 2021-04-04 — End: 2021-04-04

## 2021-04-04 MED ORDER — OLANZapine (ZYPREXA) tablet 2.5 mg
2.5 | Freq: Four times a day (QID) | ORAL | Status: AC | PRN
Start: 2021-04-04 — End: 2021-04-04

## 2021-04-04 MED ORDER — OLANZapine (ZYPREXA) injection 2.5 mg
10 | Freq: Once | INTRAMUSCULAR | Status: AC | PRN
Start: 2021-04-04 — End: 2021-04-04

## 2021-04-04 MED ORDER — acetaminophen (TYLENOL) tablet 650 mg
325 | Freq: Four times a day (QID) | ORAL | PRN
Start: 2021-04-04 — End: 2021-04-06

## 2021-04-04 MED FILL — TAMSULOSIN 0.4 MG CAPSULE: 0.4 0.4 mg | ORAL | Qty: 1

## 2021-04-04 MED FILL — ATORVASTATIN 80 MG TABLET: 80 80 MG | ORAL | Qty: 1

## 2021-04-04 MED FILL — ASPIRIN 81 MG CHEWABLE TABLET: 81 81 MG | ORAL | Qty: 1

## 2021-04-04 MED FILL — OLANZAPINE 2.5 MG TABLET: 2.5 2.5 MG | ORAL | Qty: 1

## 2021-04-04 MED FILL — ARIPIPRAZOLE 5 MG TABLET: 5 5 MG | ORAL | Qty: 1

## 2021-04-04 MED FILL — VALSARTAN 80 MG TABLET: 80 80 MG | ORAL | Qty: 1

## 2021-04-04 MED FILL — NICOTINE 14 MG/24 HR DAILY TRANSDERMAL PATCH: 14 14 mg/24 hr | TRANSDERMAL | Qty: 1

## 2021-04-04 MED FILL — CLOPIDOGREL 75 MG TABLET: 75 75 mg | ORAL | Qty: 1

## 2021-04-04 MED FILL — TYLENOL 325 MG TABLET: 325 325 mg | ORAL | Qty: 2

## 2021-04-04 MED FILL — HEPARIN (PORCINE) 5,000 UNIT/ML INJECTION SOLUTION: 5000 5,000 unit/mL | INTRAMUSCULAR | Qty: 1

## 2021-04-04 NOTE — Unmapped (Addendum)
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 He was admitted to the neurology inpatient service for acute ischemic stroke [facility] MCA, M2).     Interval History / Subjective     Feels alright this morning, unable to express any further. NAEO. PT/OT/Speech therapy saw pt yesterday, recommending dysphagia 3 with thin liquids and inpatient rehab. Waiting on pre-certification for [facility].    Review of Systems (Focused)     Unable to assess ROS due to aphasia    Medications     See MAR    Vital Signs / Intake & Output     Temp:  [97.8 ??F (36.6 ??C)-98.2 ??F (36.8 ??C)] 97.8 ??F (36.6 ??C)  Heart Rate:  [104-113] 113  Resp:  [16-18] 18  BP: (133-135)/(76-97) 133/97      Intake/Output Summary (Last 24 hours) at 04/04/2021 2956  Last data filed at 04/03/2021 1950  Gross per 24 hour   Intake 750 ml   Output 300 ml   Net 450 ml       Physical Exam     Physical Exam:  General Exam: Pt in NAD  HEENT: NCAT, no nuchal rigidity  CV: RRR, no edema  Lungs: Breathing comfortably on room air, no wheezing  Abd: Soft, NT    Neurologic Physical Exam:    Cognitive: Alert and awake, resting in bed.   Language/Speech: Able to follow some simple commands when demonstrationed.  Unable to answer questions or answer orientation questions    CN:   CN II: visual field intact to confrontation  CN III/IV/VI: Extraocular movements intact and pupils equal, round, reactive to light   CN V: Facial sensation grossly  CN VII: bilateral facial symmetry  CN VIII: Hearing intact to voice  CN IX/X: palate elevation symmetric   CN XI: trapezius 5/5 symmetric strength  CN XII: Tongue protrudes midline    Motor: Normal tone throughout, no atrophy present    Strength: unable to complete strength test today 2/2 to aphasia,  but was ambulating all extremities with at least +3/5 strength in all extremities.     Sensory: intact diffusely to light touch bilaterally    Coordination/Cerebellum: No dysmetria, dysdiadochokinesia, or ataxia noted; no tremor noted    Gait: not tested this AM    Laboratory Data     Reviewed in Epic     Diagnostic Studies     CT Angio Neck W and or WO    Addendum Date: 03/29/2021    ** ADDENDUM #1** Following availability of perfusion imaging the CTA was reevaluated. There is Complete occlusion of the left posterior M2 segment in the peri-insular sulcus. There is poor collateral flow. These findings correspond to known perfusion defect Approved by Helane Gunther, MD on 03/28/2021 4:52 PM EST I have personally reviewed the images and I agree with this report. Report Verified by: Leda Roys, MD at 03/29/2021 9:04 AM EST** ORIGINAL REPORT ** EXAM: CT ANGIOGRAPHY HEAD W &/OR WO WITH VIZ LVO EXAM: CT ANGIO NECK W AND OR WO CONTRAST INDICATION: Neuro deficit, acute, stroke suspected TECHNIQUE: Axial thin section CT angiography of the neck and head was performed with 80 mL of IOHEXOL 350 MG IODINE/ML INTRAVENOUS SOLUTION administered intravenously (accession [id number]), 80  mL of IOHEXOL 350 MG IODINE/ML INTRAVENOUS SOLUTION administered intravenously (accession [id number]). 3-D reconstructions of the cervical and cranial vessels were performed on a separate 3-D workstation, including MIP, curved MPR, and VRT images utilizing a radiologist approved protocol and were interpreted along with source images. Image data from the CTA was sent to [email or web address] for LVO analysis. This analysis was sent to the stroke team for clinical use.  COMPARISON: None available.  FINDINGS: The cervical portion of the exam is limited by motion artifact. Pulmonary arteries: No included emboli. Arch anatomy and vessel origins: Standard three-vessel arch anatomy. Patent innominate, bilateral common carotid, subclavian, and vertebral artery  origins. Right carotid system: Normal common, external, and internal carotid arteries. Left carotid system: Normal common, external, and internal carotid arteries. Right vertebral artery: Normal. Left vertebral artery:  Normal. Intracranial posterior circulation: Normal bilateral V4 segments.  The basilar artery enhances normally. Posterior cerebral arteries enhance normally. Intracranial anterior circulation: Normal cavernous and supraclinoid internal carotids. The bilateral middle cerebral arteries and branches enhance normally. The bilateral anterior cerebral arteries enhance normally. Venous structures: Venous structures in the head are fairly well opacified in the arterial phase with no significant abnormality. Extravascular findings: No acute orbital abnormality. Clear paranasal sinuses. Clear mastoid air cells. No included neck mass or adenopathy. Normal intracranial structures. No compressive abnormality of the cervical spine. No suspicious lung nodules included. 3D reconstructions of the neck vessels including MIP and curved MPR images reconstructed on a separate 3D workstation show no evidence of dissection or pseudoaneurysm. 3D reconstructions of the cranial vessels including MIP and VRT images reconstructed on a separate 3D workstation show no evidence of intracranial aneurysm, stenosis, or AV shunting lesion. No occlusive change is seen. IMPRESSION: Neck 1.  No acute arterial abnormality Head 1.  No acute arterial abnormality Report Verified by: Leda Roys, MD at 03/28/2021 10:38 AM EST    Result Date: 03/29/2021  IMPRESSION: Neck 1.  No acute arterial abnormality Head 1.  No acute arterial abnormality Report Verified by: Leda Roys, MD at 03/28/2021 10:38 AM EST    MRI Head WO contrast    Result Date: 03/31/2021  IMPRESSION: 1.  Acute infarct involving the left parietal-temporal region. Left M2 occlusion with abnormal flow void is redemonstrated. 2.  Interval decrease in spotty diffusion  signal in the left occipital lobe related to evolving subacute infarct. CRITICAL RESULT: Acute infarct involving the left parietal-temporal region.  This finding was discussed with Joya Martyr, MD on  03/31/2021 2:48 AM EST by telephone. They confirmed that they understood the findings communicated to them.  #[id number]# Approved by Ephriam Jenkins, MD on 03/31/2021 2:59 AM EST I have personally reviewed the images and I agree with this report. Report Verified by: Charlean Sanfilippo, MD at 03/31/2021 3:20 AM EST    Code Stroke-CT Head WO contrast    Result Date: 03/28/2021  IMPRESSION: 1.  Evolving left MCA distribution infarct in the general region of diffusion restriction on MRI 03/19/2021. If there is concern for progressive infarction, MRI would be more sensitive for further evaluation. 2.  No intracranial mass effect or definite hemorrhage. 3.  Chronic white matter disease and remote left basal ganglia lacunar infarct. Report Verified by: Veto Kemps, MD at 03/28/2021 10:02 AM EST    CT Cerebral Perfusion With IV contrast    Result Date: 03/28/2021  IMPRESSION: 1.  moderate area of hypoperfusion in the left MCA territory with a central ischemic core of 25 mL and a 48 mL penumbra.  Report Verified by: Alfredo Bach, DO at 03/28/2021 11:13 AM EST    CT Angio Head W and or WO WITH VIZ LVO    Addendum Date: 03/29/2021    ** ADDENDUM #1** Following availability of perfusion imaging the CTA was reevaluated. There is Complete occlusion of the left posterior M2 segment in the peri-insular sulcus. There is poor collateral flow. These findings correspond to known perfusion defect Approved by Helane Gunther, MD on 03/28/2021 4:52 PM EST I have personally reviewed the images and I agree with this report. Report Verified by: Leda Roys, MD at 03/29/2021 9:04 AM EST** ORIGINAL REPORT ** EXAM: CT ANGIOGRAPHY HEAD W &/OR WO WITH VIZ LVO EXAM: CT ANGIO NECK W AND OR WO CONTRAST INDICATION: Neuro deficit, acute, stroke suspected TECHNIQUE: Axial  thin section CT angiography of the neck and head was performed with 80 mL of IOHEXOL 350 MG IODINE/ML INTRAVENOUS SOLUTION administered intravenously (accession [id number]), 80 mL of IOHEXOL 350 MG IODINE/ML INTRAVENOUS SOLUTION administered intravenously (accession [id number]). 3-D reconstructions of the cervical and cranial vessels were performed on a separate 3-D workstation, including MIP, curved MPR, and VRT images utilizing a radiologist approved protocol and were interpreted along with source images. Image data from the CTA was sent to [email or web address] for LVO analysis. This analysis was sent to the stroke team for clinical use.  COMPARISON: None available.  FINDINGS: The cervical portion of the exam is limited by motion artifact. Pulmonary arteries: No included emboli. Arch anatomy and vessel origins: Standard three-vessel arch anatomy. Patent innominate, bilateral common carotid, subclavian, and vertebral artery origins. Right carotid system: Normal common, external, and internal carotid arteries. Left carotid system: Normal common, external, and internal carotid arteries. Right vertebral artery: Normal. Left vertebral artery:  Normal. Intracranial posterior circulation: Normal bilateral V4 segments.  The basilar artery enhances normally. Posterior cerebral arteries enhance normally. Intracranial anterior circulation: Normal cavernous and supraclinoid internal carotids. The bilateral middle cerebral arteries and branches enhance normally. The bilateral anterior cerebral arteries enhance normally. Venous structures: Venous structures in the head are fairly well opacified in the arterial phase with no significant abnormality. Extravascular findings: No acute orbital abnormality. Clear paranasal sinuses. Clear mastoid air cells. No included neck mass or adenopathy. Normal intracranial structures. No compressive abnormality of the cervical spine. No suspicious lung nodules included. 3D reconstructions of the neck  vessels including MIP and curved MPR images reconstructed on a separate 3D workstation show no evidence of dissection or pseudoaneurysm. 3D reconstructions of the cranial vessels including MIP and VRT images reconstructed on a separate 3D workstation show no evidence of intracranial aneurysm, stenosis, or AV shunting lesion. No occlusive change is seen. IMPRESSION: Neck 1.  No acute arterial abnormality Head 1.  No acute arterial abnormality Report Verified by: Leda Roys, MD at 03/28/2021 10:38 AM EST    Result Date: 03/29/2021  IMPRESSION: Neck 1.  No acute arterial abnormality Head 1.  No acute arterial abnormality Report Verified by: Leda Roys, MD at 03/28/2021 10:38 AM EST      Assessment & Plan     Acute ischemic stroke due to left MCA (M2) occlusion  ??? Sx:??Woke up with acute onset of aphasia at approx. 0800 on 03/28/21, LKN approx. 2300 on 03/27/21. Initial NIHSS 6.  ??? CT:??Evolution of stroke in similar territory to prior from 03/18/21, L MCA.  ??? CTA:??Left M2 occlusion.  ??? CTP:??25??mL ischemic??core and 48 mL penumbra  ??? MRI: Acute infarct of L parietal-temporal region. L M2 occlusion with  abnormal flow void. Interval decrease in spotty diffusion signal in L occipital lobe (evolving subacute infarct).  ??? TTE: LVEF 65-60%, no RWMA, G1DD, normal LA, no shunt/thrombus.  ??? Basic stroke risk labs:??A1C??5.8% (03/20/2021); TSH??1.03??(03/20/2021).??LDL 116  ??? Attempted thrombectomy, unable to get to Left M2 due to stenosis    Most consistent with large vessel etiology   -continue aspirin 81mg  and plavix 75mg  daily  -home lipitor 80mg  nightly  -SBP 120-150  -PT/OT/SLP- IPR, dysphagia 1 diet  -Telemetry  -precert pending to [place], per to per scheduled for today    #Tachycardia  Mild elevation of HR, potentially 2/2 to low volume status, especially in light of increased creatinine (1.07 ->.1.32)  Plan  - follow up today's renal panel  - consider 1 L bolus if creatinine climbing    #HTN  Home regimen: clonidine 0.2mg   BID, diltiazem 240mg  dialy, hydralazine 25mg  BID, losartan 50mg  daily. Unclear when these were last taken. A little tachycardic, possibly due to poor PO intake.  -valsartan 80mg  daily  -Clonidine 0.2mg  patch  -04/03/21: SBP 104-136  -consider fluids if poor PO intake    #T2DM  A1C at goal    #Depression  Continue Abilify 5mg  daily  - prn olanzapine 2.5mg     #Urinary retention  Continue flomax 0.4mg  nightly     Checklist:  Fluids: none  Electrolytes: none  Nutrition: dysphagia 1  Telemetry: yes  DVT PPx: SQH  GI PPx: none  Restraints: none  Lines: PIV  Catheter: none  Labs: daily if able  Family: updated daily    Disposition: IPR  Code Status: Full Code    Signed:  Elta Guadeloupe, MD  Internal Medicine Resident Physician, PGY-1  04/04/2021 6:39 AM

## 2021-04-04 NOTE — Unmapped (Signed)
[facility]   Div[facility] of [facility] and [facility]   Consult Note    Assessment and Plan:     Primary Diagnosis and Impairments: Acute ischemic stroke with recurrent expressive aphasia    Patient is a 66 year old male patient with history of L MCA (2017, February 2023), HTN, T2DM, depression, and urinary retention with acute ischemic stroke in L MCA with expressive aphasia and right sided hemineglect. Patient did not engage with interview or exam this afternoon either responding with no or mumbling when asked questions, but patient did spontaneously sit up at edge of bed and move all 4 extremities well. PM&R consulted for dispo assistance.       Rehab/Disposition:   -PT/OT/SLT evaluations with emphasis on ADL's, mobility, transfers, cognition, speech, swallow/dysphagia, family training and environmental modifications.    - At this time, pt would benefit from IPR as there are multiple deficits that therapy and his medical team could improve upon, including but not limited to his aphasia, max assist with feeding, safety awareness, R sided neglect with L gaze preference, etc.     CVA: L MCA CVA with expressive aphasia.   -Recommend elevating affected side, monitoring for emerging tone, and addressing potential neuropathic pain as it develops.  - SQ heparin for DVT ppx   - continue aspirin and plavix per neurology     Spacticity:   -does not preference one position for any extremity, no observed difficulty with flexion or extension of any extremity      Pain Control:   -None    Nutrition:   -SLP eval and tx with Dysphagia 3 / Thin liquids diet recommended with max assist required for slow rate of intake with small bites and sips.    -Pt will require longterm, adequate, sustainable nutrition plan prior to discharge to inpatient rehab. If patient is unable to tolerate oral diet (or is requiring NGT feeds), consider PEG placement prior to discharge.      Bladder:    -Patient able to void, no  catheter    Bowel:    -Last BM 3/7 (type 7), no bowel regimen    Mood/Agitation:   -At risk of delirium. Recommend frequent reorientation, lights on during day/off at night, regulation of sleep-wake cycle.   -Minimize lines, restraints, number of visitors and general overstimulation as medically appropriate.   -Rule out potentially contributing or confounding medical variables as able.      Discussed above plan with attending physician.     Noelle PennerAbraham Araya, MS3  [facility] PM&R  Consult Pager: [phone]    The above note was edited as appropriate by me. Attending attestation to follow.       Dois DavenportTHMAN Keyuana Wank, MD  Physical Medicine & Rehabilitation Resident  04/04/2021, 3:07 PM         HPI:     Hospital Course:   66 year old male patient with history of L MCA (2017, February 2023), HTN, T2DM, depression, and urinary retention with acute ischemic stroke in L MCA with expressive aphasia and right sided hemineglect. Patient did not engage with interview or exam this afternoon either responding with no or mumbling when asked questions, but patient did spontaneously sit up at edge of bed and move all 4 extremities well.    PM&R consulted for dispo planning    Today, patient was awoken from sleep at start of interview. Patient either did not respond, replied no or mumbled responses when asked questions. Patient did spontaneously sit up at  edge of bed using upper extremities for support and could move legs freely. He was falling asleep at the edge of the bed and did not want to lie back down.       Review of Systems:     10 point review of systems negative except as otherwise noted above.     Past Medical History :     Past Medical History:   Diagnosis Date   ??? COPD (chronic obstructive pulmonary disease) (CMS-HCC)    ??? Diabetes mellitus (CMS-HCC)    ??? Dysphagia    ??? GERD (gastroesophageal reflux disease)    ??? Hematuria    ??? Hepatitis    ??? Hypertension         Past Surgical History:     Past Surgical History:   Procedure Laterality  Date   ??? ESOPHAGOGASTRODUODENOSCOPY N/A 11/19/2013    Procedure: ESOPHAGOGASTRODUODENOSCOPY WITH MAC;  Surgeon: Charlane Ferretti, MD;  Location: [facility] ENDOSCOPY;  Service: Gastroenterology;  Laterality: N/A;   ??? FOOT SURGERY Left    ??? HERNIA REPAIR      about 5 yrs ago        Family History:   History reviewed. No pertinent family history.     Social and Functional History:   Living Situation: apartment with significant other and  66 year old daugther  Prior Level of Function: full ADLs   Equipment: single point cane, rolling walker    Support: significant other  Occupation Hx: Astronomer    Medications:   Scheduled Medications:  ARIPiprazole, 5 mg, Daily 0900  aspirin, 81 mg, Daily with breakfast  atorvastatin, 80 mg, Nightly (2100)  cloNIDine, 1 patch, Weekly  clopidogreL, 75 mg, Daily 0900  heparin, 5,000 Units, 3 times per day  nicotine, 1 patch, Daily 0900  tamsulosin, 0.4 mg, Nightly (2100)  valsartan, 80 mg, Daily 0900         PRN Medications:  hydrALAZINE, 10 mg, Q1H PRN  nicotine (polacrilex), 2 mg, Q1H PRN  OLANZapine, 2.5 mg, Q6H PRN  ondansetron, 4 mg, Q6H PRN        Allergies:  Allergies   Allergen Reactions   ??? Penicillins Hives          Physical Exam:     Vitals:    04/04/21 1200   BP: 104/62   Pulse: 100   Resp: 12   Temp: 98.2 ??F (36.8 ??C)   SpO2: 100%       General: WDWN age appropriate adult lying in bed before spontaneously sitting up on edge of bed  HEENT: normal conjunctiva,   CV: extremities warm  Pulm: symmetric chest rise, comfortable ORA  Extremities: normal muscle bulk, no atrophy, no edema, symmetric  Skin: No rashes or lesions in exposed areas.   Psych: agitated    Neurologic:   Alert, unable to assess orientation, intermittent dysarthria  Does not follow commands  Unable to assess coordination  Unable to assess sensation   CN:   EOMI, facial expression grossly symmetric, unable to assess remaining CN    MSK:   Strength:   RUE: Moves spontaneously, can use for support  when sitting up  LUE: Moves spontaneously, can use for support when sitting up  RLE: Moves spontaneously  LLE: Moves spontaneously  Muscle tone: does not preference one position for any extremity, no observed difficulty with flexion or extension of any extremity        Relevant Imaging:     Imaging:  Results for orders placed during the hospital encounter of 03/28/21    MRI HEAD WITHOUT CONTRAST    Impression  1.  Acute infarct involving the left parietal-temporal region. Left M2 occlusion with abnormal flow void is redemonstrated.  2.  Interval decrease in spotty diffusion signal in the left occipital lobe related to evolving subacute infarct.          CRITICAL RESULT: Acute infarct involving the left parietal-temporal region.  This finding was discussed with Joya Martyr, MD on  03/31/2021 2:48 AM EST by telephone. They confirmed that they understood the findings communicated to them.  #[id number]#    Approved by Ephriam Jenkins, MD on 03/31/2021 2:59 AM EST    I have personally reviewed the images and I agree with this report.    Report Verified by: Charlean Sanfilippo, MD at 03/31/2021 3:20 AM EST    Signed by: Caren Hazy, MD on 03/31/2021  3:20 AM            Therapy Assessments:   Speech Therapy (3/7):   Pt continues to demonstrate oral dysphagia, with improved oral efficiency compared to initial assessment. Pt's poor dentition is also a factor into impaired mastication. Pt requires max assist to implement swallow strategies. Diet: Dysphagia 3 / Thin liquids.     Occupational Therapy (3/7):   [facility] initially agreeable to OT session. Patient request bed after functional mobility. Continues to demo significant R side neglect affecting his safety during all adl and mobility tasks. Patient will benefit from the intensity of an acute rehab stay with multiple skilled therapy disciplines. The patient will benefit significantly from and is able to tolerate extensive daily therapy (at least 3 hours per day). At this time the  patient is below their baseline with functional mobility, requiring increased need for assistance and increased burden of care.  Recommendation: IP Rehab  Equipment Recommendations: Defer at this time    Physical Therapy (3/7):   Roxy continues to require encouragement for participation in therapy due to his significant receptive and expressive deficits.  He demonstrates significant R neglect with L gaze preference affecting his overall safety with ambulation as he is unable to avoid obstacles on the right side.  He would continue to benefit from PT at Rehab upon discharge to maximize his functional independence.   Recommendation: IP Rehab  Equipment Recommended: Defer until further assessment

## 2021-04-04 NOTE — Unmapped (Signed)
Problem: Patient will remain free of falls  Goal: Universal Fall Precautions  Outcome: Progressing     Problem: Safety  Goal: Patient will be injury free during hospitalization  Description: Assess and monitor vitals signs, neurological status including level of consciousness and orientation. Assess patient's risk for falls and implement fall prevention plan of care and interventions per hospital policy.      Ensure arm band on, uncluttered walking paths in room, adequate room lighting, call light and overbed table within reach, bed in low position, wheels locked, side rails up per policy, and non-skid footwear provided.   Outcome: Progressing

## 2021-04-04 NOTE — Unmapped (Addendum)
  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    PCP:  Vevelyn Francois, DO    Home Pharmacy:    [facility] [address] [place] [facility] [address]  [address] [place] [address]  Phone: [phone]     [facility]  [address]  [place] [address]  Phone: [phone]         Medical Insurance Coverage:  Payor: UNITED HEALTHCARE MANAGED MEDICARE / Plan: [facility] MEDICARE HMO COMPLETE / Product Type: Medicare Mngd Care /     Other Pertinent Information     RN Case Manager participated in interdisciplinary rounds with the MD team and completed chart review. Per team, patient is medically ready for discharge. Patient is accepted at [facility] for inpatient rehab, pre-cert started 3/3 and updated notes sent yesterday. RN CM received call from Jody/[facility], medical director wants a peer to peer prior to final decision, MD needs to call [phone] opt.5 to reach medical director for peer to peer, must call prior to 1300 PM today, and provide name, DOB and member ID. RN CM notified team.     1120 - NP/Caldwell updated RN CM stating MD is currently doing P2P.     1145 - MD/Herrera updated RN CM, P2P was denied, insurance MD stated there is data to support that if a person is min assist for transfers they get the same benefit from SNF and IPR, MD stated she is going to have  PM&R assess to see if that will alter their decision, will update RN CM.     1150 - RN CM received call from [employer] stating insurance denied, RN CM informed liaison of MD's plan for PM&R, liaison can follow up with personal appeal (which is reviewed by a different team) if that does not help, RN CM will keep her updated.     Discharge Plan     Anticipated discharge plan:  Inpatient rehab, [facility]     Anticipated discharge date:  1-2 days     CM/SW will continue to follow and remain available for discharge planning needs.      Quay Burow, RN CM     Cell [phone]

## 2021-04-05 MED FILL — NICOTINE 14 MG/24 HR DAILY TRANSDERMAL PATCH: 14 14 mg/24 hr | TRANSDERMAL | Qty: 1

## 2021-04-05 MED FILL — CLOPIDOGREL 75 MG TABLET: 75 75 mg | ORAL | Qty: 1

## 2021-04-05 MED FILL — HEPARIN (PORCINE) 5,000 UNIT/ML INJECTION SOLUTION: 5000 5,000 unit/mL | INTRAMUSCULAR | Qty: 1

## 2021-04-05 MED FILL — TAMSULOSIN 0.4 MG CAPSULE: 0.4 0.4 mg | ORAL | Qty: 1

## 2021-04-05 MED FILL — ASPIRIN 81 MG CHEWABLE TABLET: 81 81 MG | ORAL | Qty: 1

## 2021-04-05 MED FILL — ATORVASTATIN 80 MG TABLET: 80 80 MG | ORAL | Qty: 1

## 2021-04-05 MED FILL — ARIPIPRAZOLE 5 MG TABLET: 5 5 MG | ORAL | Qty: 1

## 2021-04-05 MED FILL — VALSARTAN 80 MG TABLET: 80 80 MG | ORAL | Qty: 1

## 2021-04-05 NOTE — Unmapped (Addendum)
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    PCP:  Vevelyn FrancoisELIZABETH ANNE DORIOTT, DO    Home Pharmacy:    [facility] [address] [facility] [place], [place] - [address]  [address]  [place] [id number]  Phone: [phone]     [facility]  [address]  [place] [address]  Phone: [phone]         Medical Insurance Coverage:  Payor: UNITED HEALTHCARE MANAGED MEDICARE / [facility] / Product Type: Medicare Mngd Care /     Other Pertinent Information     RN Case Manager participated in interdisciplinary rounds with the MD team and completed chart review. Per team patient is medically ready for discharge, PM&R saw patient yesterday and put in note recommending IPR. [facility] accepted by [facility], pre-cert denied by insurance after peer to peer yesterday, MD/Herrera stated has not contacted anyone at insurance subsequently and P2P had to be done yesterday by 1300. RN CM called Stacey/[facility] [phone]), she will go to bedside and have patient sign paperwork to start appeal process, will update RN CM on appeal when she hears back from insurance.     1030 - [facility] liaison Misty StanleyStacey came to [employer] CM's office, stated she tried to have patient sign the consent for submitting an appeal, but he refused saying I'll just have to wait and just kept  repeating himself, will be onsite all day and can try again later. RN CM called patient's significant other [place] HunBethany Achor [phone]), stated she is en route to hospital and should be at bedside in next 30 minutes, RN CM notified Stacey/[facility].     1142 - RN CM received call from [facility]/[place] stating family is at bedside. RN CM called Stacey/[facility], left voicemail stating family is at bedside.     1150 - Stacey/[facility] came to [employer] CM's office, significant other signed consent for appeal, RN CM provided copy of HCPOA paperwork to liaison from chart, will send appeal to insurance. RN CM updated team.     Discharge Plan     Anticipated discharge plan:  [facility]patient [facility]hab, [facility]     Anticipated discharge date:  1-2 days     CM/SW will continue to follow and remain available for discharge planning needs.      Quay BurowLisa [place], RN CM     Cell [phone]

## 2021-04-05 NOTE — Unmapped (Signed)
[facility]  Neurology Department  Daily Progress Note    Chief Complaint / Brief hospital summary     [name] is a 66 year old male with PMH of L MCA strokes (most recent 03/18/21), HTN, and T2DM, who presented with acute onset of recurrent expressive aphasia. He was admitted to the neurology inpatient service for acute ischemic stroke (Left MCA, M2).     Interval History / Subjective     PM&R evaluated the patient yesterday and recommended IPR. Will discuss with insurance company about getting him approved for [employer]. Patient eating breakfast this morning, motioning that he did not want to be disturbed    Review of Systems (Focused)     Unable to assess ROS due to aphasia    Medications     See MAR    Vital Signs / Intake & Output     Temp:  [97.4 ??F (36.3 ??C)-98.2 ??F (36.8 ??C)] 97.8 ??F (36.6 ??C)  Heart Rate:  [100-105] 100  Resp:  [12-18] 18  BP: (104-136)/(62-87) 112/73      Intake/Output Summary (Last 24 hours) at 04/05/2021 0723  Last data filed at 04/04/2021 2000  Gross per 24 hour   Intake 120 ml   Output --   Net 120 ml       Physical Exam     Physical Exam:  General Exam: Pt in NAD  HEENT: NCAT, no nuchal rigidity  CV: RRR, no edema  Lungs: Breathing comfortably on room air, no wheezing  Abd: Soft, NT    Neurologic Physical Exam:    Cognitive: Alert and awake, resting in bed.   Language/Speech: Able to follow some simple commands when demonstrationed.  Unable to answer questions or answer orientation questions    CN:   CN II: visual field intact to confrontation  CN III/IV/VI: Extraocular movements intact and pupils equal, round, reactive to light   CN V: Facial sensation grossly  CN VII: right facial droop mild  CN VIII: Hearing intact to voice  CN IX/X: palate elevation symmetric   CN XI: trapezius 5/5 symmetric strength  CN XII: Tongue protrudes midline    Motor: Normal tone throughout, no atrophy present    Strength: Would not participate in strength testing but moves  all extremities anti-gravity and symmetrically.     Sensory: intact diffusely to light touch bilaterally    Deep Tendon Reflexes: previously tested as  ?? R L   Biceps 3+ 2+   Triceps - -   Brachioradialis 3+ 2+   Patellar 2+ 2+   Achilles 2+  2+   Toes ?? ??   ??  Coordination/Cerebellum: No dysmetria, dysdiadochokinesia, or ataxia noted; no tremor noted    Gait: not tested this AM    Laboratory Data     Reviewed in Epic     Diagnostic Studies     CT Angio Neck W and or WO    Addendum Date: 03/29/2021    ** ADDENDUM #1** Following availability of perfusion imaging the CTA was reevaluated. There is Complete occlusion of the left posterior M2 segment in the peri-insular sulcus. There is poor collateral flow. These findings correspond to known perfusion defect Approved by Helane Gunther, MD on 03/28/2021 4:52 PM EST I have personally reviewed the images and I agree with this report. Report Verified by: Leda Roys, MD at 03/29/2021 9:04 AM EST** ORIGINAL REPORT ** EXAM: CT ANGIOGRAPHY HEAD W &/OR WO WITH VIZ LVO EXAM: CT ANGIO  NECK W AND OR WO CONTRAST INDICATION: Neuro deficit, acute, stroke suspected TECHNIQUE: Axial thin section CT angiography of the neck and head was performed with 80 mL of IOHEXOL 350 MG IODINE/ML INTRAVENOUS SOLUTION administered intravenously (accession [id number]), 80 mL of IOHEXOL 350 MG IODINE/ML INTRAVENOUS SOLUTION administered intravenously (accession [id number]). 3-D reconstructions of the cervical and cranial vessels were performed on a separate 3-D workstation, including MIP, curved MPR, and VRT images utilizing a radiologist approved protocol and were interpreted along with source images. Image data from the CTA was sent to [email or web address] for LVO analysis. This analysis was sent to the stroke team for clinical use.  COMPARISON: None available.  FINDINGS: The cervical portion of the exam is limited by motion artifact. Pulmonary arteries: No included emboli. Arch anatomy and vessel  origins: Standard three-vessel arch anatomy. Patent innominate, bilateral common carotid, subclavian, and vertebral artery origins. Right carotid system: Normal common, external, and internal carotid arteries. Left carotid system: Normal common, external, and internal carotid arteries. Right vertebral artery: Normal. Left vertebral artery:  Normal. Intracranial posterior circulation: Normal bilateral V4 segments.  The basilar artery enhances normally. Posterior cerebral arteries enhance normally. Intracranial anterior circulation: Normal cavernous and supraclinoid internal carotids. The bilateral middle cerebral arteries and branches enhance normally. The bilateral anterior cerebral arteries enhance normally. Venous structures: Venous structures in the head are fairly well opacified in the arterial phase with no significant abnormality. Extravascular findings: No acute orbital abnormality. Clear paranasal sinuses. Clear mastoid air cells. No included neck mass or adenopathy. Normal intracranial structures. No compressive abnormality of the cervical spine. No suspicious lung nodules included. 3D reconstructions of the neck vessels including MIP and curved MPR images reconstructed on a separate 3D workstation show no evidence of dissection or pseudoaneurysm. 3D reconstructions of the cranial vessels including MIP and VRT images reconstructed on a separate 3D workstation show no evidence of intracranial aneurysm, stenosis, or AV shunting lesion. No occlusive change is seen. IMPRESSION: Neck 1.  No acute arterial abnormality Head 1.  No acute arterial abnormality Report Verified by: Leda Roys, MD at 03/28/2021 10:38 AM EST    Result Date: 03/29/2021  IMPRESSION: Neck 1.  No acute arterial abnormality Head 1.  No acute arterial abnormality Report Verified by: Leda Roys, MD at 03/28/2021 10:38 AM EST    MRI Head WO contrast    Result Date: 03/31/2021  IMPRESSION: 1.  Acute infarct involving the left  parietal-temporal region. Left M2 occlusion with abnormal flow void is redemonstrated. 2.  Interval decrease in spotty diffusion signal in the left occipital lobe related to evolving subacute infarct. CRITICAL RESULT: Acute infarct involving the left parietal-temporal region.  This finding was discussed with Joya Martyr, MD on  03/31/2021 2:48 AM EST by telephone. They confirmed that they understood the findings communicated to them.  #[id number]# Approved by Ephriam Jenkins, MD on 03/31/2021 2:59 AM EST I have personally reviewed the images and I agree with this report. Report Verified by: Charlean Sanfilippo, MD at 03/31/2021 3:20 AM EST    Code Stroke-CT Head WO contrast    Result Date: 03/28/2021  IMPRESSION: 1.  Evolving left MCA distribution infarct in the general region of diffusion restriction on MRI 03/19/2021. If there is concern for progressive infarction, MRI would be more sensitive for further evaluation. 2.  No intracranial mass effect or definite hemorrhage. 3.  Chronic white matter disease and remote left basal ganglia lacunar infarct. Report Verified by: Veto Kemps, MD at 03/28/2021 10:02 AM EST  CT Cerebral Perfusion With IV contrast    Result Date: 03/28/2021  IMPRESSION: 1.  moderate area of hypoperfusion in the left MCA territory with a central ischemic core of 25 mL and a 48 mL penumbra. Report Verified by: Alfredo Bach, DO at 03/28/2021 11:13 AM EST    CT Angio Head W and or WO WITH VIZ LVO    Addendum Date: 03/29/2021    ** ADDENDUM #1** Following availability of perfusion imaging the CTA was reevaluated. There is Complete occlusion of the left posterior M2 segment in the peri-insular sulcus. There is poor collateral flow. These findings correspond to known perfusion defect Approved by Helane Gunther, MD on 03/28/2021 4:52 PM EST I have personally reviewed the images and I agree with this report. Report Verified by: Leda Roys, MD at 03/29/2021 9:04 AM EST** ORIGINAL REPORT ** EXAM: CT ANGIOGRAPHY HEAD W  &/OR WO WITH VIZ LVO EXAM: CT ANGIO NECK W AND OR WO CONTRAST INDICATION: Neuro deficit, acute, stroke suspected TECHNIQUE: Axial thin section CT angiography of the neck and head was performed with 80 mL of IOHEXOL 350 MG IODINE/ML INTRAVENOUS SOLUTION administered intravenously (accession [id number]), 80 mL of IOHEXOL 350 MG IODINE/ML INTRAVENOUS SOLUTION administered intravenously (accession [id number]). 3-D reconstructions of the cervical and cranial vessels were performed on a separate 3-D workstation, including MIP, curved MPR, and VRT images utilizing a radiologist approved protocol and were interpreted along with source images. Image data from the CTA was sent to [email or web address] for LVO analysis. This analysis was sent to the stroke team for clinical use.  COMPARISON: None available.  FINDINGS: The cervical portion of the exam is limited by motion artifact. Pulmonary arteries: No included emboli. Arch anatomy and vessel origins: Standard three-vessel arch anatomy. Patent innominate, bilateral common carotid, subclavian, and vertebral artery origins. Right carotid system: Normal common, external, and internal carotid arteries. Left carotid system: Normal common, external, and internal carotid arteries. Right vertebral artery: Normal. Left vertebral artery:  Normal. Intracranial posterior circulation: Normal bilateral V4 segments.  The basilar artery enhances normally. Posterior cerebral arteries enhance normally. Intracranial anterior circulation: Normal cavernous and supraclinoid internal carotids. The bilateral middle cerebral arteries and branches enhance normally. The bilateral anterior cerebral arteries enhance normally. Venous structures: Venous structures in the head are fairly well opacified in the arterial phase with no significant abnormality. Extravascular findings: No acute orbital abnormality. Clear paranasal sinuses. Clear mastoid air cells. No included neck mass or adenopathy. Normal intracranial  structures. No compressive abnormality of the cervical spine. No suspicious lung nodules included. 3D reconstructions of the neck vessels including MIP and curved MPR images reconstructed on a separate 3D workstation show no evidence of dissection or pseudoaneurysm. 3D reconstructions of the cranial vessels including MIP and VRT images reconstructed on a separate 3D workstation show no evidence of intracranial aneurysm, stenosis, or AV shunting lesion. No occlusive change is seen. IMPRESSION: Neck 1.  No acute arterial abnormality Head 1.  No acute arterial abnormality Report Verified by: Leda Roys, MD at 03/28/2021 10:38 AM EST    Result Date: 03/29/2021  IMPRESSION: Neck 1.  No acute arterial abnormality Head 1.  No acute arterial abnormality Report Verified by: Leda Roys, MD at 03/28/2021 10:38 AM EST      Assessment & Plan     Acute ischemic stroke due to left MCA (M2) occlusion  ??? Sx:??Woke up with acute onset of aphasia at approx. 0800 on 03/28/21, LKN approx. 2300 on 03/27/21. Initial NIHSS 6.  ???  CT:??Evolution of stroke in similar territory to prior from 03/18/21, L MCA.  ??? CTA:??Left M2 occlusion.  ??? CTP:??25??mL ischemic??core and 48 mL penumbra  ??? MRI: Acute infarct of L parietal-temporal region. L M2 occlusion with abnormal flow void. Interval decrease in spotty diffusion signal in L occipital lobe (evolving subacute infarct).  ??? TTE: LVEF 65-60%, no RWMA, G1DD, normal LA, no shunt/thrombus.  ??? Basic stroke risk labs:??A1C??5.8% (03/20/2021); TSH??1.03??(03/20/2021).??LDL 116  ??? Attempted thrombectomy, unable to get to Left M2 due to stenosis    Most consistent with large vessel etiology   -continue aspirin 81mg  and plavix 75mg  daily 90 days, then switching to full dose aspirin 325mg  06/26/2021  -home lipitor 80mg  nightly  -SBP 120-150  -PT/OT/SLP- IPR, dysphagia 1 diet  -Telemetry  -may need appeal for IPR  -PM&R saw patient appreciate recs    #HTN  #Tachycardia, resolved  Home regimen: clonidine 0.2mg  BID,  diltiazem 240mg  dialy, hydralazine 25mg  BID, losartan 50mg  daily. Unclear when these were last taken. A little tachycardic, possibly due to poor PO intake.  -valsartan 80mg  daily  -Clonidine 0.2mg  patch    #T2DM  A1C at goal    #Depression  Continue Abilify 5mg  daily  - olanzapine 2.5mg  if needed for agitation    #Urinary retention  Continue flomax 0.4mg  nightly     Checklist:  Fluids: none  Electrolytes: none  Nutrition: dysphagia 1  Telemetry: yes  DVT PPx: SQH  GI PPx: none  Restraints: none  Lines: PIV  Catheter: none  Labs: daily if able  Family: updated daily    Disposition: IPR  Code Status: Full Code    Signed:  Ronalee Red, MD PhD  Neurology Resident PGY-2  04/05/2021 7:23 AM

## 2021-04-06 ENCOUNTER — Inpatient Hospital Stay
Admission: EM | Admit: 2021-04-06 | Discharge: 2021-04-16 | Disposition: A | Discharge status: Home / Self Care | Payer: Medicare (Managed Care) | Source: Other Acute Inpatient Hospital

## 2021-04-06 DIAGNOSIS — I63512 Cerebral infarction due to unspecified occlusion or stenosis of left middle cerebral artery: Secondary | ICD-10-CM

## 2021-04-06 MED ORDER — valsartan (DIOVAN) 80 MG tablet
80 | ORAL_TABLET | Freq: Every day | ORAL | 0 refills | Status: AC
Start: 2021-04-06 — End: ?

## 2021-04-06 MED ORDER — cloNIDine (CATAPRES) 0.2 mg/24 hr patch
0.2 | MEDICATED_PATCH | TRANSDERMAL | 0 refills | Status: AC
Start: 2021-04-06 — End: ?

## 2021-04-06 MED ORDER — aspirin 81 MG chewable tablet
81 | ORAL_TABLET | Freq: Every day | ORAL | 0 refills | 30.00000 days | Status: AC
Start: 2021-04-06 — End: ?

## 2021-04-06 MED ORDER — nicotine (NICODERM CQ) 14 mg/24 hr
14 | MEDICATED_PATCH | Freq: Every day | TRANSDERMAL | 0 refills | Status: AC
Start: 2021-04-06 — End: ?

## 2021-04-06 MED ORDER — nicotine, polacrilex, (NICORETTE) 2 mg gum
2 | BUCCAL | 0 refills | Status: AC | PRN
Start: 2021-04-06 — End: ?

## 2021-04-06 MED ORDER — clopidogreL (PLAVIX) 75 mg tablet
75 | ORAL_TABLET | Freq: Every day | ORAL | 0 refills | Status: AC
Start: 2021-04-06 — End: 2021-06-26

## 2021-04-06 MED FILL — CLOPIDOGREL 75 MG TABLET: 75 75 mg | ORAL | Qty: 1

## 2021-04-06 MED FILL — HEPARIN (PORCINE) 5,000 UNIT/ML INJECTION SOLUTION: 5000 5,000 unit/mL | INTRAMUSCULAR | Qty: 1

## 2021-04-06 MED FILL — ASPIRIN 81 MG CHEWABLE TABLET: 81 81 MG | ORAL | Qty: 1

## 2021-04-06 MED FILL — VALSARTAN 80 MG TABLET: 80 80 MG | ORAL | Qty: 1

## 2021-04-06 NOTE — Unmapped (Signed)
[facility]    Case Manager/Social Worker Discharge Summary     Patient name: [name]                                        Patient MRN: [id number]  DOB: November 24, 1955                              Age: 66 y.o.              Gender: male  Patient emergency contact: Extended Emergency Contact Information  Primary Emergency Contact: Achor,Bethany (HCPOA)   [place]  Mobile Phone: [phone]  Relation: Significant other  Secondary Emergency Contact: Thompson,Carolyn   [place]  Mobile Phone: [phone]  Relation: Sister      Attending provider: Ebony HailAngela Morriss, MD  Primary care physician: Hendricks LimesELIZABETH ANNE DORIOTT, DO    The MD has indicated that the patient is ready for discharge.  Danny PartridgeJohnny O Stanhope was referred and accepted at [facility] at [facility]patient Rehabilitation Facility Provider Contact Number: [phone].  The patient will be transported by [facility] [facility] [phone] at 530    Transfer Mode/Level of Care: Basic Life Support Stretcher (BLS)         DC Summary and COC have been faxed to facility.     The plan has been reviewed:     Patient/Family Informed of Discharge Plan: Yes    Plan Reviewed With Patient, Family, or Significant Other: Yes    Patient and or family are aware and in agreement with the discharge plan: Yes    Family Member Name and Relationship Notified at Discharge: Jacolyn ReedyBethany SO(HCPOA)   Family Contact Number: [phone]    Plan reviewed with MD and other members of the health care team: Yes  Care Plan Completed: Yes    No further CM/SW needs.    This plan has been reviewed with the multi-disciplinary team.     Treatment Preferences         Post-Discharge Goals    Patient's Post-Discharge goals: go to IPR safely    Post Acute Care Provider Information:  [facility]   Inpatient Rehabilitation Facility Provider Contact Number: [phone]

## 2021-04-06 NOTE — Unmapped (Signed)
[facility]  Department of Neurology  Discharge Summary    Patient: [name]   MRN: [id number] CSN: [id number]  Date of Admission: 03/28/2021  Date of Discharge: 04/06/2021   Attending Physician: Ebony Hail, MD   Diagnoses Present on Admission     Past Medical History:   Diagnosis Date   ??? COPD (chronic obstructive pulmonary disease) (CMS-HCC)    ??? Diabetes mellitus (CMS-HCC)    ??? Dysphagia    ??? GERD (gastroesophageal reflux disease)    ??? Hematuria    ??? Hepatitis    ??? Hypertension       Discharge Diagnoses     Active Hospital Problems    Diagnosis Date Noted   ??? Acute ischemic left MCA stroke (CMS-HCC) [I63.512] 03/31/2021      Resolved Hospital Problems   No resolved problems to display.     Operations/Procedures Performed (include dates)   Neuro-IR for thrombectomy 03/28/2021  Imaging (include dates)     MRI Head WO contrast   Final Result   IMPRESSION:       1.  Acute infarct involving the left parietal-temporal region. Left M2 occlusion with abnormal flow void is redemonstrated.   2.  Interval decrease in spotty diffusion signal in the left occipital lobe related to evolving subacute infarct.               CRITICAL RESULT: Acute infarct involving the left parietal-temporal region.  This finding was discussed with Joya Martyr, MD on  03/31/2021 2:48 AM EST by telephone. They confirmed that they understood the findings communicated to them.  #[id number]#      Approved by Ephriam Jenkins, MD on 03/31/2021 2:59 AM EST      I have personally reviewed the images and I agree with this report.      Report Verified by: Charlean Sanfilippo, MD at 03/31/2021 3:20 AM EST      CT Cerebral Perfusion With IV contrast   Final Result   IMPRESSION:      1.  moderate area of hypoperfusion in the left MCA territory with a central ischemic core of 25 mL and a 48 mL penumbra.         Report Verified by: Alfredo Bach, DO at 03/28/2021 11:13 AM EST      Code Stroke-CT Head WO contrast   Final Result   IMPRESSION:      1.   Evolving left MCA distribution infarct in the general region of diffusion restriction on MRI 03/19/2021. If there is concern for progressive infarction, MRI would be more sensitive for further evaluation.   2.  No intracranial mass effect or definite hemorrhage.   3.  Chronic white matter disease and remote left basal ganglia lacunar infarct.      Report Verified by: Veto Kemps, MD at 03/28/2021 10:02 AM EST      CT Angio Neck W and or WO   Final Result   Addendum (preliminary) 1 of 1   ** ADDENDUM #1**   Following availability of perfusion imaging the CTA was reevaluated. There    is Complete occlusion of the left posterior M2 segment in the peri-insular    sulcus. There is poor collateral flow. These findings correspond to known    perfusion defect      Approved by Helane Gunther, MD on 03/28/2021 4:52 PM EST      I have personally reviewed the images and I agree with this report.  Report Verified by: Leda Roys, MD at 03/29/2021 9:04 AM EST**    ORIGINAL REPORT **   EXAM: CT ANGIOGRAPHY HEAD W &/OR WO WITH VIZ LVO   EXAM: CT ANGIO NECK W AND OR WO CONTRAST      INDICATION: Neuro deficit, acute, stroke suspected      TECHNIQUE: Axial thin section CT angiography of the neck and head was    performed with 80 mL of IOHEXOL 350 MG IODINE/ML INTRAVENOUS SOLUTION    administered intravenously (accession [id number]), 80 mL of IOHEXOL 350    MG IODINE/ML INTRAVENOUS SOLUTION administered intravenously (accession    [id number]). 3-D reconstructions of the cervical and cranial vessels    were performed on a separate 3-D workstation, including MIP, curved MPR,    and VRT images utilizing a radiologist approved protocol and were    interpreted along with source images. Image data from the CTA was sent to    [email or web address] for LVO analysis. This analysis was sent to the stroke team for    clinical use.        COMPARISON: None available.       FINDINGS:      The cervical portion of the exam is limited by motion  artifact.      Pulmonary arteries: No included emboli.      Arch anatomy and vessel origins: Standard three-vessel arch anatomy.    Patent innominate, bilateral common carotid, subclavian, and vertebral    artery origins.      Right carotid system: Normal common, external, and internal carotid    arteries.      Left carotid system: Normal common, external, and internal carotid    arteries.      Right vertebral artery: Normal.      Left vertebral artery:  Normal.      Intracranial posterior circulation: Normal bilateral V4 segments.  The    basilar artery enhances normally. Posterior cerebral arteries enhance    normally.       Intracranial anterior circulation: Normal cavernous and supraclinoid    internal carotids. The bilateral middle cerebral arteries and branches    enhance normally. The bilateral anterior cerebral arteries enhance    normally.      Venous structures: Venous structures in the head are fairly well opacified    in the arterial phase with no significant abnormality.      Extravascular findings: No acute orbital abnormality. Clear paranasal    sinuses. Clear mastoid air cells. No included neck mass or adenopathy.    Normal intracranial structures. No compressive abnormality of the cervical    spine. No suspicious lung nodules included.      3D reconstructions of the neck vessels including MIP and curved MPR images    reconstructed on a separate 3D workstation show no evidence of dissection    or pseudoaneurysm.      3D reconstructions of the cranial vessels including MIP and VRT images    reconstructed on a separate 3D workstation show no evidence of    intracranial aneurysm, stenosis, or AV shunting lesion. No occlusive    change is seen.      IMPRESSION:      Neck   1.  No acute arterial abnormality      Head   1.  No acute arterial abnormality      Report Verified by: Leda Roys, MD at 03/28/2021 10:38 AM EST      Final   IMPRESSION:  Neck   1.  No acute arterial abnormality      Head    1.  No acute arterial abnormality      Report Verified by: Leda Roys, MD at 03/28/2021 10:38 AM EST      CT Angio Head W and or WO WITH VIZ LVO   Final Result   Addendum (preliminary) 1 of 1   ** ADDENDUM #1**   Following availability of perfusion imaging the CTA was reevaluated. There    is Complete occlusion of the left posterior M2 segment in the peri-insular    sulcus. There is poor collateral flow. These findings correspond to known    perfusion defect      Approved by Helane Gunther, MD on 03/28/2021 4:52 PM EST      I have personally reviewed the images and I agree with this report.      Report Verified by: Leda Roys, MD at 03/29/2021 9:04 AM EST**    ORIGINAL REPORT **   EXAM: CT ANGIOGRAPHY HEAD W &/OR WO WITH VIZ LVO   EXAM: CT ANGIO NECK W AND OR WO CONTRAST      INDICATION: Neuro deficit, acute, stroke suspected      TECHNIQUE: Axial thin section CT angiography of the neck and head was    performed with 80 mL of IOHEXOL 350 MG IODINE/ML INTRAVENOUS SOLUTION    administered intravenously (accession [id number]), 80 mL of IOHEXOL 350    MG IODINE/ML INTRAVENOUS SOLUTION administered intravenously (accession    [id number]). 3-D reconstructions of the cervical and cranial vessels    were performed on a separate 3-D workstation, including MIP, curved MPR,    and VRT images utilizing a radiologist approved protocol and were    interpreted along with source images. Image data from the CTA was sent to    [email or web address] for LVO analysis. This analysis was sent to the stroke team for    clinical use.        COMPARISON: None available.       FINDINGS:      The cervical portion of the exam is limited by motion artifact.      Pulmonary arteries: No included emboli.      Arch anatomy and vessel origins: Standard three-vessel arch anatomy.    Patent innominate, bilateral common carotid, subclavian, and vertebral    artery origins.      Right carotid system: Normal common, external, and internal  carotid    arteries.      Left carotid system: Normal common, external, and internal carotid    arteries.      Right vertebral artery: Normal.      Left vertebral artery:  Normal.      Intracranial posterior circulation: Normal bilateral V4 segments.  The    basilar artery enhances normally. Posterior cerebral arteries enhance    normally.       Intracranial anterior circulation: Normal cavernous and supraclinoid    internal carotids. The bilateral middle cerebral arteries and branches    enhance normally. The bilateral anterior cerebral arteries enhance    normally.      Venous structures: Venous structures in the head are fairly well opacified    in the arterial phase with no significant abnormality.      Extravascular findings: No acute orbital abnormality. Clear paranasal    sinuses. Clear mastoid air cells. No included neck mass or adenopathy.    Normal intracranial structures. No compressive abnormality of the cervical  spine. No suspicious lung nodules included.      3D reconstructions of the neck vessels including MIP and curved MPR images    reconstructed on a separate 3D workstation show no evidence of dissection    or pseudoaneurysm.      3D reconstructions of the cranial vessels including MIP and VRT images    reconstructed on a separate 3D workstation show no evidence of    intracranial aneurysm, stenosis, or AV shunting lesion. No occlusive    change is seen.      IMPRESSION:      Neck   1.  No acute arterial abnormality      Head   1.  No acute arterial abnormality      Report Verified by: Leda Roys, MD at 03/28/2021 10:38 AM EST      Final   IMPRESSION:      Neck   1.  No acute arterial abnormality      Head   1.  No acute arterial abnormality      Report Verified by: Leda Roys, MD at 03/28/2021 10:38 AM EST          Consulting Services (include reason)   1. Neuro-IR  Allergies   Penicillins  Discharge Medications        Medication List      TAKE these medications, which are NEW       Quantity/Refills   aspirin 81 MG chewable tablet  Chew 1 tablet (81 mg total) by mouth daily with breakfast.  Replaces: aspirin 81 MG EC tablet   Quantity: 30 tablet  Refills: 0     cloNIDine 0.2 mg/24 hr patch  Commonly known as: CATAPRES  Place 1 patch onto the skin once a week.  Start taking on: April 09, 2021   Quantity: 4 patch  Refills: 0     nicotine (polacrilex) 2 mg gum  Commonly known as: NICORETTE  Take 1 each (2 mg total) by mouth every hour as needed for Smoking cessation.   Quantity: 100 Stick  Refills: 0     nicotine 14 mg/24 hr  Commonly known as: NICODERM CQ  Place 1 patch onto the skin daily.   Quantity: 28 patch  Refills: 0     valsartan 80 MG tablet  Commonly known as: DIOVAN  Take 1 tablet (80 mg total) by mouth daily.   Quantity: 30 tablet  Refills: 0        TAKE these medications, which you were ALREADY TAKING      Quantity/Refills   ARIPiprazole 5 MG tablet  Commonly known as: ABILIFY  Take 1 tablet (5 mg total) by mouth daily.   Refills: 0     atorvastatin 80 MG tablet  Commonly known as: LIPITOR  Take 1 tablet (80 mg total) by mouth at bedtime.   Quantity: 30 tablet  Refills: 0     clopidogreL 75 mg tablet  Commonly known as: PLAVIX  Take 1 tablet (75 mg total) by mouth daily for 81 days. Indications: Cerebral Thromboembolism Prevention, For 90 days only. Discontinue after 90 days   Quantity: 81 tablet  For: Cerebral Thromboembolism Prevention, For 90 days only. Discontinue after 90 days  Refills: 0     tamsulosin 0.4 mg Cap  Commonly known as: FLOMAX  Take 1 capsule (0.4 mg total) by mouth at bedtime. Take 0.4 mg by mouth daily   Quantity: 30 capsule  Refills: 3  STOP taking these medications    aspirin 81 MG EC tablet  Replaced by: aspirin 81 MG chewable tablet     cholecalciferol (vitamin D3) 50 mcg (2,000 unit) Cap     cloNIDine HCL 0.2 MG tablet  Commonly known as: CATAPRES     diltiazem 240 MG 24 hr capsule  Commonly known as: CARDIZEM CD     hydrALAZINE 25 MG tablet  Commonly  known as: APRESOLINE     loratadine 10 mg Cap     losartan 50 MG tablet  Commonly known as: COZAAR     montelukast 10 mg tablet  Commonly known as: SINGULAIR     oxybutynin 10 MG 24 hr tablet  Commonly known as: DITROPAN-XL     oxyCODONE 5 MG immediate release tablet  Commonly known as: ROXICODONE     tiotropium 18 mcg  Commonly known as: SPIRIVA           Where to Get Your Medications      Information about where to get these medications is not yet available    Ask your nurse or doctor about these medications  ?? aspirin 81 MG chewable tablet  ?? cloNIDine 0.2 mg/24 hr patch  ?? clopidogreL 75 mg tablet  ?? nicotine (polacrilex) 2 mg gum  ?? nicotine 14 mg/24 hr  ?? valsartan 80 MG tablet         Reason for Admission   DOLAN XIA is a 66 y.o. male with PMH of L MCA strokes in 2017 but left AMA before work-up was completed, HTN, DM, who presented with recurrent and worsened aphasia. He was admitted to a(n) telemetry bed level of care for further management.  Hospital Course     Active Hospital Problems    Diagnosis Date Noted   ??? Acute ischemic left MCA stroke (CMS-HCC) [I63.512] 03/31/2021      Resolved Hospital Problems   No resolved problems to display.      Acute ischemic stroke due to left MCA (M2) occlusion with history of LMCA strokes  ??? Sx:??Woke up with acute onset of aphasia at approx. 0800 on 03/28/21, LKN approx. 2300 on 03/27/21. Initial NIHSS 6.  ??? CT:??Evolution of stroke in similar territory to prior from 03/18/21, L MCA.  ??? CTA:??Left M2 occlusion.  ??? CTP:??25??mL ischemic??core and 48 mL penumbra  ??? MRI: Acute infarct of L parietal-temporal region. L M2 occlusion with abnormal flow void. Interval decrease in spotty diffusion signal in L occipital lobe (evolving subacute infarct).  ??? TTE:??LVEF 65-60%, no RWMA, G1DD, normal LA, no shunt/thrombus.  ??? Basic stroke risk labs:??A1C??5.8% (03/20/2021); TSH??1.03??(03/20/2021).??LDL 116  ??? Attempted thrombectomy 3/1 but M2 was too stenosed and wire could not be passed.  Thrombectomy was not successful and stenosis could not be reached.   ??? Etiology: intracranial atherosclerosis    Patient presented with severe receptive and expressive aphasia with history of LMCA strokes. Patient NIHSS 6 on arrival. Patient was found to have Left M2 occlusion. Patient taken for thrombectomy 3/1 and was not able to be completed due to severe stenosis and inability to get to Left M2. Patient's NIHSS was 8 for questions commands, facial palsy, mild sensory loss and severe aphasia. Etiology was thought to be due to intracranial atherosclerosis. Patient was started on SAMMPRIS for treatment of intracranial atherosclerosis with aspirin 81mg  and plavix 75mg  for 90 days as below. Patient's ECHO, and stroke labs as above. PT/OT/SLP recommended IPR.     Plan  -Aspirin 81mg  and plavix  75mg  for 90 days until 06/26/2021  - after 90 days switch to full dose aspirin 325mg  and discontinue plavix   -lipitor 80mg  nightly  -stroke follow-up scheduled with Dr. Abelina Bachelor 07/22/2021    HTN  Home regimen: clonidine 0.2mg  BID, diltiazem 240mg  dialy, hydralazine 25mg  BID, losartan 50mg  daily. Unclear when these were last taken. Patient was well controlled with the following:  -valsartan 80mg  daily  -Clonidine 0.2mg  patch    #T2DM  A1C at goal  ??  #Depression  Continue Abilify 5mg  daily  ??  #Urinary retention  Continue flomax 0.4mg  nightly   ??  Neurologic Examination   BP 133/74 (BP Location: Left arm, Patient Position: Sitting)    Pulse 75    Temp 97.7 ??F (36.5 ??C) (Oral)    Resp 15    Ht 6' (1.829 m)    Wt 179 lb (81.2 kg)    SpO2 99%    BMI 24.28 kg/m??   Physical Exam:  General Exam: Pt in NAD  HEENT: NCAT, no nuchal rigidity  CV: RRR, no edema  Lungs: Breathing comfortably on room air, no wheezing  Abd: Soft, NT  ??  Neurologic Physical Exam:  ??  Cognitive: Alert and awake, eating this morning. Pleasant today.   Language/Speech: Saying only no, able to say alright. Unable to follow simple commands. Unable to answer  questions or answer orientation questions  ??  CN:   CN II: visual field intact to confrontation  CN III/IV/VI: Extraocular movements intact and pupils equal, round, reactive to light   CN V: Facial sensation grossly  CN VII: facial motion with slight right nasolabial fold  CN VIII: Hearing intact to voice  CN IX/X: palate elevation symmetric   CN XI: trapezius 5/5 symmetric strength  CN XII: Tongue protrudes midline  ??  Motor: Normal tone throughout, no atrophy present  ??  Strength: upper extremities strong 5/5 bilateral, good grip and bicep and tricep bilaterally 5/5. 5/5 strength in bilateral lowers  ??  Sensory: intact diffusely to light touch bilaterally  ??  Deep Tendon Reflexes:   ?? R L   Biceps 3+ 2+   Triceps - -   Brachioradialis 3+ 2+   Patellar 2+ 2+   Achilles 2+  2+   Toes ?? ??   ??  ??  Coordination/Cerebellum: No dysmetria, dysdiadochokinesia, or ataxia noted; no tremor noted, unable to perform finger to nose  ??  Gait: not tested this AM    Condition on Discharge   1. Functional Status: moderately impaired   Describe limitations, if any: language   PT Interventions and Frequency: Treatment/Interventions: Patient/family training, Compensatory technique education, Gait training, Continued evaluation, Therapeutic Activity, Neuromuscular Reeducation  PT Frequency: minimum 2x/week   PT Recommendations: Recommendation: IP Rehab  Equipment Recommended: Defer until further assessment   OT Interventions and Frequency: Treatment Interventions: ADL retraining, Activity Tolerance training, Energy Conservation, IADL retraining, VP retraining, Cognitive reorientation, Fine motor coordination activities, Patient/Family training, Functional transfer training, UE strengthening/ROM, Therapeutic Activity, Compensatory technique education  OT Frequency: minimum 3x/week   OT Recommendations: Recommendation: IP Rehab  Equipment Recommendations: Defer at this time  2. Mental Status: Confused   Describe limitations, if any:  language expressive>receptive aphasia  3. Dietary Restrictions / Tube Feeding / TPN: Yes   If yes, describe: Dysphagia 3 and thins   Speech Frequency and Recommendations: SLP Treatment Details: D3/T  4. Supplemental Oxygen / Ventilation / CPAP / Bi-Level: No   If yes, describe settings/delivery: no  Change from baseline?: N/A  5. In-dwelling Lines or Catheters: No   If yes, describe: na  Disposition   Inpatient Rehab  Follow-Up Appointments     Future Appointments   Date Time Provider Department Center   07/23/2021 11:00 AM Burnett Harry, MD [facility]   11/02/2021  9:00 AM Ernestine Mcmurray, MD [facility]     Specific Follow-Up Items for Receiving Physician   1. NURSING HOME/ INPATIENT REHAB/ FACILITY: PLEASE CALL [facility] and safety inspector AFTER PATIENT IS DISCHARGED FROM YOUR FACILITY [phone]  2. Aspirin 81mg  an plavix 75mg  for 90 days 06/26/2021  3. After 90 days switch to full dose aspirin 325mg  daily and then discontinue plavix    Junior resident: Ronalee Red PGY-2  Senior resident: Quin Hoop PGY-3  Ronalee Red, MD, PhD  04/06/2021, 3:03 PM

## 2021-04-06 NOTE — Unmapped (Signed)
Patient was discharged with all belongings.  Transport here to pick up patient. PIVs removed and all questions answered.

## 2021-04-06 NOTE — Unmapped (Signed)
[facility]  Case Manager/Social Work Discharge Needs Assessment       HORST OSTERMILLER  [id number]  66 y.o.  male  Black or African American    Cerebrovascular accident (CVA) due to occlusion of left middle cerebral artery (CMS-HCC) [I63.512]  Acute ischemic left middle cerebral artery (MCA) stroke (CMS-HCC) [I63.512]        Treatment Preferences         Post-Discharge Goals    Patient's Post-Discharge goals: go to IPR safely    Discharge Needs Assessment       Patient, family,nurse, team, and facility are aware of transport at 530pm to [facility]    Report:[phone]  Fax:[phone]

## 2021-04-06 NOTE — Unmapped (Signed)
CONTINUITY OF CARE FORM     Patient name: [name]  Patient MRN: [id number]  DOB: 1955/04/08  Age: 66 y.o.  Gender: male    Date of admission: 03/28/2021  Date of discharge: 04/06/2021    Attending provider: Ebony Hail, MD  Primary care physician: Hendricks Limes DORIOTT, DO    Code status: Full Code  Allergies:   Allergies   Allergen Reactions   ??? Penicillins Hives       Diagnoses Present on Admission   Primary Diagnosis: Acute ischemic left MCA stroke (CMS-HCC)  Discharge Diagnosis :   Active Hospital Problems    Diagnosis Date Noted   ??? Acute ischemic left MCA stroke (CMS-HCC) [I63.512] 03/31/2021      Resolved Hospital Problems   No resolved problems to display.     Prognosis: excellent  Rehabilitation potential: excellent        Diet     Diet Orders          Dysphagia Level 3: Advanced Thin Pre-select please due to language impairment starting at 03/08 0913    Dietary nutrition supplements starting at 03/07 1547        Dysphagia Assessment and Recommendations (when available): Dysphagia Diagnosis: oral dysphagia  Dysphagia Diet Recommended (when available):      As listed above    Services Required   Skilled Nursing: Yes    PT Interventions and Frequency: Treatment/Interventions: Patient/family training, Compensatory technique education, Gait training, Continued evaluation, Therapeutic Activity, Neuromuscular Reeducation  PT Frequency: minimum 2x/week    PT Recommendations: Recommendation: IP Rehab  Equipment Recommended: Defer until further assessment    OT Interventions and Frequency: Treatment Interventions: ADL retraining, Activity Tolerance training, Energy Conservation, IADL retraining, VP retraining, Cognitive reorientation, Fine motor coordination activities, Patient/Family training, Functional transfer training, UE strengthening/ROM, Therapeutic Activity, Compensatory technique education  OT Frequency: minimum 3x/week    OT Recommendations: Recommendation: IP Rehab  Equipment Recommendations:  Defer at this time    Weight bearing status:  full    Bedside Swallow Recommendations (when available):      Speech Language Recommendations (when available): Diagnosis: Non-fluent expressive aphasia, Receptive aphasia    Needs 24 hour supervision due to cognitive impairment: No    Discharge Medications   Medications:     Medication List      TAKE these medications, which are NEW      Quantity/Refills   aspirin 81 MG chewable tablet  Chew 1 tablet (81 mg total) by mouth daily with breakfast.  Replaces: aspirin 81 MG EC tablet   Quantity: 30 tablet  Refills: 0     cloNIDine 0.2 mg/24 hr patch  Commonly known as: CATAPRES  Place 1 patch onto the skin once a week.  Start taking on: April 09, 2021   Quantity: 4 patch  Refills: 0     nicotine (polacrilex) 2 mg gum  Commonly known as: NICORETTE  Take 1 each (2 mg total) by mouth every hour as needed for Smoking cessation.   Quantity: 100 Stick  Refills: 0     nicotine 14 mg/24 hr  Commonly known as: NICODERM CQ  Place 1 patch onto the skin daily.   Quantity: 28 patch  Refills: 0     valsartan 80 MG tablet  Commonly known as: DIOVAN  Take 1 tablet (80 mg total) by mouth daily.   Quantity: 30 tablet  Refills: 0        TAKE these medications, which you were ALREADY TAKING  Quantity/Refills   ARIPiprazole 5 MG tablet  Commonly known as: ABILIFY  Take 1 tablet (5 mg total) by mouth daily.   Refills: 0     atorvastatin 80 MG tablet  Commonly known as: LIPITOR  Take 1 tablet (80 mg total) by mouth at bedtime.   Quantity: 30 tablet  Refills: 0     clopidogreL 75 mg tablet  Commonly known as: PLAVIX  Take 1 tablet (75 mg total) by mouth daily for 81 days. Indications: Cerebral Thromboembolism Prevention, For 90 days only. Discontinue after 90 days   Quantity: 81 tablet  For: Cerebral Thromboembolism Prevention, For 90 days only. Discontinue after 90 days  Refills: 0     tamsulosin 0.4 mg Cap  Commonly known as: FLOMAX  Take 1 capsule (0.4 mg total) by mouth at bedtime. Take  0.4 mg by mouth daily   Quantity: 30 capsule  Refills: 3        STOP taking these medications    aspirin 81 MG EC tablet  Replaced by: aspirin 81 MG chewable tablet     cholecalciferol (vitamin D3) 50 mcg (2,000 unit) Cap     cloNIDine HCL 0.2 MG tablet  Commonly known as: CATAPRES     diltiazem 240 MG 24 hr capsule  Commonly known as: CARDIZEM CD     hydrALAZINE 25 MG tablet  Commonly known as: APRESOLINE     loratadine 10 mg Cap     losartan 50 MG tablet  Commonly known as: COZAAR     montelukast 10 mg tablet  Commonly known as: SINGULAIR     oxybutynin 10 MG 24 hr tablet  Commonly known as: DITROPAN-XL     oxyCODONE 5 MG immediate release tablet  Commonly known as: ROXICODONE     tiotropium 18 mcg  Commonly known as: SPIRIVA           Where to Get Your Medications      Information about where to get these medications is not yet available    Ask your nurse or doctor about these medications  ?? aspirin 81 MG chewable tablet  ?? cloNIDine 0.2 mg/24 hr patch  ?? clopidogreL 75 mg tablet  ?? nicotine (polacrilex) 2 mg gum  ?? nicotine 14 mg/24 hr  ?? valsartan 80 MG tablet               Discharge Specific Orders   Discharge specific orders:  None required    Isolation     Patient Isolation Status     None to display            Physician Certification of Medically Necessary Transportation   Type and reason for transportation: Wheelchair - patient has difficulty walking.  Reason for transport to another facility: Rehabilitation  Patient requires: Continuous medical supervision enroute    Follow-up Appointments and [facility] Discharge Physician Name     Future Appointments   Date Time Provider Department Center   07/23/2021 11:00 AM Burnett Harry, MD [facility] NEUR GNI UCGNI   11/02/2021  9:00 AM Ernestine Mcmurray, MD [facility] PULM HOL HOL     No follow-up provider specified.  SNF to call their PCP for an appointment upon discharge.  No follow-ups on file.    Physician Signature and Credentials   I certify that I have reviewed the  information contained herein, and that the information is a true and accurate reflection of the individual's condition.    Discharging Physician: Electronically signed by Ronalee Red,  MD, PhD  04/06/2021, 3:09 PM    SOCIAL WORK DOCUMENTATION     Facility/Agency Name: Inpatient Rehabilitation Facility Name: [facility]    Number to call report: Inpatient Rehabilitation Facility Provider Contact Number: [phone]    Level of Care at Discharge:             Less than 30 day convalescent stay:      PASARR/HENS 7000 Completed:      Family Member Name and Relationship Notified at Discharge: Jacolyn Reedy)    Family Contact Number: [phone]    Social Worker or Discharge Planner Name and Telephone Number: [facility] [phone]      NURSE DISCHARGE ASSESSMENT   Vitals:  Patient Vitals for the past 4 hrs:   BP Temp Temp src Pulse Resp SpO2   04/06/21 1216 133/74 97.7 ??F (36.5 ??C) Oral 75 15 99 %        Orientation:       Orientation Level: Other (pt regards to name and re-oriented to time, place and situation. pt unable to carryover and presents with aphasia limiting communication)  Patient Behaviors: Cooperative    Respiratory:  Respiratory (WDL): Within Defined Limits  Respiratory Pattern: Regular, Unlabored  Chest Assessment: Chest expansion symmetrical  Bilateral Breath Sounds: Unable to assess  R Breath Sounds: Unable to assess  L Breath Sounds: Unable to assess      Cardiac:  Cardiac (WDL): Within Defined Limits  Heart Sounds:  (Unable to assess)    Edema:  Peripheral Vascular (WDL): Within Defined Limits  Edema:  (Non edema present)    Wounds:       Comfort/Mattress:  Additional Comfort/Environmental Interventions: Positioning frequency, Head of bed elevated    Musculoskeletal:  Musculoskeletal (WDL): Within Defined Limits  LUE: Full movement  RUE: Full movement  RLE: Full movement  LLE: Full movement    GI:  Gastrointestinal (WDL): Within Defined Limits  GI Symptoms:  (Unable to assess)  Last BM Date:  04/05/21  Bowel Incontinence: No  Stool Source: Rectum    GU:  Genitourinary (WDL): Within Defined Limits  Genitourinary Symptoms: None  Urinary Incontinence: No  Urine Source: Urethra    Lines and Drains:  Patient Lines/Drains/Airways Status     Active Line / PIV Line     None                ADL's:  Level of Assistance: Standby assist, set-up cues, supervision of patient - no hands on  Feeding: Able to feed self, Needs set up  Level of Assistance: Minimal assist    Morse Fall Risk  Morse Fall Risk Score: 30    Restraints:  Soft Restraint R Upper Extremity: Discontinued  Soft Restraint L Upper Extremity: Discontinued  Soft Restraint L Lower Extremity: Discontinued  Clinical Justification: Protection med device access, Protection med procedure    RN to RN Handoff:  RN Giving Report:: Leaha/Brittany  RN Receiving Report:: Harbor Paster  Reason for Handoff: Shift Change        Nurse and Credentials   RN Handoff Completed by: Leaha/Brittany on 04/06/2021

## 2021-04-06 NOTE — Unmapped (Signed)
[facility]  Neurology Department  Daily Progress Note    Chief Complaint / Brief hospital summary     [name] is a 66 year old male with PMH of L MCA strokes (most recent 03/18/21), HTN, and T2DM, who presented with acute onset of recurrent expressive aphasia. He was admitted to the neurology inpatient service for acute ischemic stroke (Left MCA, M2).     Interval History / Subjective     Patient is doing well today. He was eating when I met with him today. Indicated no new needs. He worked with PT this morning.      Review of Systems (Focused)     Unable to assess ROS due to aphasia    Medications     See MAR    Vital Signs / Intake & Output     Temp:  [98 ??F (36.7 ??C)-98.3 ??F (36.8 ??C)] 98 ??F (36.7 ??C)  Heart Rate:  [82-95] 82  Resp:  [16-18] 16  BP: (120-137)/(72-82) 128/72      Intake/Output Summary (Last 24 hours) at 04/06/2021 1610  Last data filed at 04/05/2021 2001  Gross per 24 hour   Intake 1080 ml   Output --   Net 1080 ml       Physical Exam     Physical Exam:  General Exam: Pt in NAD  HEENT: NCAT, no nuchal rigidity  CV: RRR, no edema  Lungs: Breathing comfortably on room air, no wheezing  Abd: Soft, NT    Neurologic Physical Exam:    Cognitive: Alert and awake, resting in bed.   Language/Speech: Able to follow some simple commands when demonstrationed.  Unable to answer questions or answer orientation questions    CN:   CN II: visual field intact to confrontation  CN III/IV/VI: Extraocular movements intact and pupils equal, round, reactive to light   CN V: Facial sensation grossly  CN VII: right facial droop mild  CN VIII: Hearing intact to voice  CN IX/X: palate elevation symmetric   CN XI: trapezius 5/5 symmetric strength  CN XII: Tongue protrudes midline    Motor: Normal tone throughout, no atrophy present    Strength: Patient antigravity in upper and lower extremities and is strong biceps/triceps 5/5 in bilateral upper and quadriceps/hamstrings 5/5  bilaterally    Sensory: intact diffusely to light touch bilaterally    Deep Tendon Reflexes: previously tested as  ?? R L   Biceps 3+ 2+   Triceps - -   Brachioradialis 3+ 2+   Patellar 2+ 2+   Achilles 2+  2+   Toes ?? ??   ??  Coordination/Cerebellum: No dysmetria, dysdiadochokinesia, or ataxia noted; no tremor noted    Gait: not tested this AM    Laboratory Data     Reviewed in Epic     Diagnostic Studies     CT Angio Neck W and or WO    Addendum Date: 03/29/2021    ** ADDENDUM #1** Following availability of perfusion imaging the CTA was reevaluated. There is Complete occlusion of the left posterior M2 segment in the peri-insular sulcus. There is poor collateral flow. These findings correspond to known perfusion defect Approved by Helane Gunther, MD on 03/28/2021 4:52 PM EST I have personally reviewed the images and I agree with this report. Report Verified by: Leda Roys, MD at 03/29/2021 9:04 AM EST** ORIGINAL REPORT ** EXAM: CT ANGIOGRAPHY HEAD W &/OR WO WITH VIZ LVO EXAM: CT ANGIO NECK W  AND OR WO CONTRAST INDICATION: Neuro deficit, acute, stroke suspected TECHNIQUE: Axial thin section CT angiography of the neck and head was performed with 80 mL of IOHEXOL 350 MG IODINE/ML INTRAVENOUS SOLUTION administered intravenously (accession [id number]), 80 mL of IOHEXOL 350 MG IODINE/ML INTRAVENOUS SOLUTION administered intravenously (accession [id number]). 3-D reconstructions of the cervical and cranial vessels were performed on a separate 3-D workstation, including MIP, curved MPR, and VRT images utilizing a radiologist approved protocol and were interpreted along with source images. Image data from the CTA was sent to [email or web address] for LVO analysis. This analysis was sent to the stroke team for clinical use.  COMPARISON: None available.  FINDINGS: The cervical portion of the exam is limited by motion artifact. Pulmonary arteries: No included emboli. Arch anatomy and vessel origins: Standard three-vessel arch  anatomy. Patent innominate, bilateral common carotid, subclavian, and vertebral artery origins. Right carotid system: Normal common, external, and internal carotid arteries. Left carotid system: Normal common, external, and internal carotid arteries. Right vertebral artery: Normal. Left vertebral artery:  Normal. Intracranial posterior circulation: Normal bilateral V4 segments.  The basilar artery enhances normally. Posterior cerebral arteries enhance normally. Intracranial anterior circulation: Normal cavernous and supraclinoid internal carotids. The bilateral middle cerebral arteries and branches enhance normally. The bilateral anterior cerebral arteries enhance normally. Venous structures: Venous structures in the head are fairly well opacified in the arterial phase with no significant abnormality. Extravascular findings: No acute orbital abnormality. Clear paranasal sinuses. Clear mastoid air cells. No included neck mass or adenopathy. Normal intracranial structures. No compressive abnormality of the cervical spine. No suspicious lung nodules included. 3D reconstructions of the neck vessels including MIP and curved MPR images reconstructed on a separate 3D workstation show no evidence of dissection or pseudoaneurysm. 3D reconstructions of the cranial vessels including MIP and VRT images reconstructed on a separate 3D workstation show no evidence of intracranial aneurysm, stenosis, or AV shunting lesion. No occlusive change is seen. IMPRESSION: Neck 1.  No acute arterial abnormality Head 1.  No acute arterial abnormality Report Verified by: Leda Roys, MD at 03/28/2021 10:38 AM EST    Result Date: 03/29/2021  IMPRESSION: Neck 1.  No acute arterial abnormality Head 1.  No acute arterial abnormality Report Verified by: Leda Roys, MD at 03/28/2021 10:38 AM EST    MRI Head WO contrast    Result Date: 03/31/2021  IMPRESSION: 1.  Acute infarct involving the left parietal-temporal region. Left M2 occlusion  with abnormal flow void is redemonstrated. 2.  Interval decrease in spotty diffusion signal in the left occipital lobe related to evolving subacute infarct. CRITICAL RESULT: Acute infarct involving the left parietal-temporal region.  This finding was discussed with Joya Martyr, MD on  03/31/2021 2:48 AM EST by telephone. They confirmed that they understood the findings communicated to them.  #[id number]# Approved by Ephriam Jenkins, MD on 03/31/2021 2:59 AM EST I have personally reviewed the images and I agree with this report. Report Verified by: Charlean Sanfilippo, MD at 03/31/2021 3:20 AM EST    Code Stroke-CT Head WO contrast    Result Date: 03/28/2021  IMPRESSION: 1.  Evolving left MCA distribution infarct in the general region of diffusion restriction on MRI 03/19/2021. If there is concern for progressive infarction, MRI would be more sensitive for further evaluation. 2.  No intracranial mass effect or definite hemorrhage. 3.  Chronic white matter disease and remote left basal ganglia lacunar infarct. Report Verified by: Veto Kemps, MD at 03/28/2021 10:02 AM EST  CT Cerebral Perfusion With IV contrast    Result Date: 03/28/2021  IMPRESSION: 1.  moderate area of hypoperfusion in the left MCA territory with a central ischemic core of 25 mL and a 48 mL penumbra. Report Verified by: Alfredo Bach, DO at 03/28/2021 11:13 AM EST    CT Angio Head W and or WO WITH VIZ LVO    Addendum Date: 03/29/2021    ** ADDENDUM #1** Following availability of perfusion imaging the CTA was reevaluated. There is Complete occlusion of the left posterior M2 segment in the peri-insular sulcus. There is poor collateral flow. These findings correspond to known perfusion defect Approved by Helane Gunther, MD on 03/28/2021 4:52 PM EST I have personally reviewed the images and I agree with this report. Report Verified by: Leda Roys, MD at 03/29/2021 9:04 AM EST** ORIGINAL REPORT ** EXAM: CT ANGIOGRAPHY HEAD W &/OR WO WITH VIZ LVO EXAM: CT ANGIO NECK W  AND OR WO CONTRAST INDICATION: Neuro deficit, acute, stroke suspected TECHNIQUE: Axial thin section CT angiography of the neck and head was performed with 80 mL of IOHEXOL 350 MG IODINE/ML INTRAVENOUS SOLUTION administered intravenously (accession [id number]), 80 mL of IOHEXOL 350 MG IODINE/ML INTRAVENOUS SOLUTION administered intravenously (accession [id number]). 3-D reconstructions of the cervical and cranial vessels were performed on a separate 3-D workstation, including MIP, curved MPR, and VRT images utilizing a radiologist approved protocol and were interpreted along with source images. Image data from the CTA was sent to [email or web address] for LVO analysis. This analysis was sent to the stroke team for clinical use.  COMPARISON: None available.  FINDINGS: The cervical portion of the exam is limited by motion artifact. Pulmonary arteries: No included emboli. Arch anatomy and vessel origins: Standard three-vessel arch anatomy. Patent innominate, bilateral common carotid, subclavian, and vertebral artery origins. Right carotid system: Normal common, external, and internal carotid arteries. Left carotid system: Normal common, external, and internal carotid arteries. Right vertebral artery: Normal. Left vertebral artery:  Normal. Intracranial posterior circulation: Normal bilateral V4 segments.  The basilar artery enhances normally. Posterior cerebral arteries enhance normally. Intracranial anterior circulation: Normal cavernous and supraclinoid internal carotids. The bilateral middle cerebral arteries and branches enhance normally. The bilateral anterior cerebral arteries enhance normally. Venous structures: Venous structures in the head are fairly well opacified in the arterial phase with no significant abnormality. Extravascular findings: No acute orbital abnormality. Clear paranasal sinuses. Clear mastoid air cells. No included neck mass or adenopathy. Normal intracranial structures. No compressive abnormality of  the cervical spine. No suspicious lung nodules included. 3D reconstructions of the neck vessels including MIP and curved MPR images reconstructed on a separate 3D workstation show no evidence of dissection or pseudoaneurysm. 3D reconstructions of the cranial vessels including MIP and VRT images reconstructed on a separate 3D workstation show no evidence of intracranial aneurysm, stenosis, or AV shunting lesion. No occlusive change is seen. IMPRESSION: Neck 1.  No acute arterial abnormality Head 1.  No acute arterial abnormality Report Verified by: Leda Roys, MD at 03/28/2021 10:38 AM EST    Result Date: 03/29/2021  IMPRESSION: Neck 1.  No acute arterial abnormality Head 1.  No acute arterial abnormality Report Verified by: Leda Roys, MD at 03/28/2021 10:38 AM EST      Assessment & Plan     Acute ischemic stroke due to left MCA (M2) occlusion  ??? Sx:??Woke up with acute onset of aphasia at approx. 0800 on 03/28/21, LKN approx. 2300 on 03/27/21. Initial NIHSS 6.  ???  CT:??Evolution of stroke in similar territory to prior from 03/18/21, L MCA.  ??? CTA:??Left M2 occlusion.  ??? CTP:??25??mL ischemic??core and 48 mL penumbra  ??? MRI: Acute infarct of L parietal-temporal region. L M2 occlusion with abnormal flow void. Interval decrease in spotty diffusion signal in L occipital lobe (evolving subacute infarct).  ??? TTE: LVEF 65-60%, no RWMA, G1DD, normal LA, no shunt/thrombus.  ??? Basic stroke risk labs:??A1C??5.8% (03/20/2021); TSH??1.03??(03/20/2021).??LDL 116  ??? Attempted thrombectomy, unable to get to Left M2 due to stenosis    Most consistent with large vessel etiology   -continue aspirin 81mg  and plavix 75mg  daily 90 days, then switching to full dose aspirin 325mg  06/26/2021  -home lipitor 80mg  nightly  -SBP 120-150  -PT/OT/SLP- IPR, dysphagia 1 diet  -Telemetry  -currently appealing for IPR  -PM&R saw patient appreciate recs    #HTN  #Tachycardia, resolved  Home regimen: clonidine 0.2mg  BID, diltiazem 240mg  dialy, hydralazine  25mg  BID, losartan 50mg  daily. Unclear when these were last taken. A little tachycardic, possibly due to poor PO intake.  -valsartan 80mg  daily  -Clonidine 0.2mg  patch    #T2DM  A1C at goal    #Depression  Continue Abilify 5mg  daily  - olanzapine 2.5mg  if needed for agitation    #Urinary retention  Continue flomax 0.4mg  nightly     Checklist:  Fluids: none  Electrolytes: none  Nutrition: dysphagia 1  Telemetry: yes  DVT PPx: SQH  GI PPx: none  Restraints: none  Lines: PIV  Catheter: none  Labs: daily if able  Family: updated daily    Disposition: IPR  Code Status: Full Code    Signed:  Ronalee Red, MD PhD  Neurology Resident PGY-2  04/06/2021 7:18 AM

## 2021-04-06 NOTE — Unmapped (Signed)
[facility]  Case Management/Social Work Department  Progress Note    Patient Information     Patient Name: [name]  MRN: [id number]  Hospital day: 9  Inpatient/Observation:  Inpatient   Level of Care:  floor  Admit date:  03/28/2021  Admission diagnosis: Cerebrovascular accident (CVA) due to occlusion of left middle cerebral artery (CMS-HCC) [I63.512]  Acute ischemic left middle cerebral artery (MCA) stroke (CMS-HCC) [I63.512]    PMH:  has a past medical history of COPD (chronic obstructive pulmonary disease) (CMS-HCC), Diabetes mellitus (CMS-HCC), Dysphagia, GERD (gastroesophageal reflux disease), Hematuria, Hepatitis, and Hypertension.    PCP:  Vevelyn Francois, DO    Home Pharmacy:    [facility] [address], [place] - [address]  [address] [place] [address]  Phone: [phone]     [facility]  [address]  [place] [address]  Phone: [phone]         Medical Insurance Coverage:  Payor: [employer] MANAGED MEDICARE / Plan: [facility] MEDICARE HMO COMPLETE / Product Type: Medicare Mngd Care /     Other Pertinent Information     RNCM received report from team, patient is medically ready. Currently waiting on result from expedited appeal that was started yesterday.     Discharge Plan     Anticipated discharge plan:  IPR     Anticipated discharge date:  3/13     CM/SW will continue to follow and remain available for discharge planning needs.      Candyce Churn, RN     Cell [phone]

## 2021-04-06 NOTE — Unmapped (Signed)
Physical Therapy  Treatment    Name: Danny PartridgeJohnny O Myers  DOB: March 13, 1955  Attending Physician: Ebony HailAngela Morriss, MD  Admission Diagnosis: Cerebrovascular accident (CVA) due to occlusion of left middle cerebral artery (CMS-HCC) [I63.512]  Acute ischemic left middle cerebral artery (MCA) stroke (CMS-HCC) [I63.512]  Date: 04/06/2021  Room: 1610/[id number]/[id number]  Reviewed Pertinent hospital course: Yes    Hospital Course PT/OT: 66 yo male presents to ED with altered speech. 3/1: HCT- evolving L MCA infarct, CTA- no acute findings, CTP- moderate area of hypoperfusion in the L MCA territory with a central ischemic core of 25 mL and a 48 mL penumbra, s/p cerebral angio- distal L M2 occlusion with severe stenosis and no intervention, 3/4: MRI -Acute infarct involving the left parietal-temporal region  Relevant PMH : L MCA stroke (L M3 occlusion s/p tPA in 2017), HTN, DMII, R parotid mass (pending excision), COPD  Precautions: none  Activity Level: Activity as tolerated    Assessment  [name] continues to require encouragement and education for participation in PT.  He continues to have a significant L gaze preference and R neglect limiting his overall safety throughout functional mobility.  He requires moderate cueing for obstacle avoidance on the R side during gait activity.  He would continue to benefit from PT at Rehab upon discharge to maximize his functional independence.          Recommendation  Recommendation: IP Rehab  Equipment Recommended: Defer until further assessment         Outcome Measures  AM-PAC 6 Clicks Basic Mobility Inpatient Short Form: PT 6 Clicks Score: 18          Mobility Recommendations for Staff  Patient ability: Patient ambulates in hallway, Patient ambulates in room/ to bathroom  Assist needed: with 1 person assist    Cognition  Overall Cognitive Status: Impaired  Arousal/Alertness: Alert  Orientation Level: Oriented to person (responds to his name- unable to assess further due to aphasia)  Behavior:  Apprehensive;Cooperative  Following Commands: Follows one step commands;Requires repetition of instruction;Requires increased time;Requires demonstration  Safety Judgment: Impaired judgment;Decreased safety awareness;Decreased awareness of safety precautions;Decreased awareness of need for assistance  Comments: Pt continues to require cueing for increased attention to the right for safety throughout functional mobility     Pain  Pain Score: 0 - No Pain     Mobility  Bed Mobility  Supine to Sit: Supervision  Sit to Supine: Supervision  Transfers  Sit to Stand: Contact Guard assistance  Stand to Sit: Contact Guard assistance  Gait  Distance: 2 x 150 ft  Level of Assistance: Minimal assistance  Assistive Device: None  Gait Characteristics: Unsteady;R decreased step length;L decreased step length;decreased cadence (and moderate cueing for increased attention the the right for obstacle avoidance. Pt frequently running into obstacles when not cued to attend to the right)  Balance  Sitting - Static: Supervision  Sitting - Dynamic: Supervision  Standing - Static: Contact Guard Assistance  Standing - Dynamic: Minimal Assistance      Position after Treatment and Safety Handoff  Position after treatment and safety handoff  Position after therapy session: Bed  Details: RN notified;Call light/ needs within reach  Alarms: Bed  Alarms Status: Activated and interfaced unchanged from previous settings    Goals  Goals Met: Bed mobility    Collaborated with: Patient  Patient Stated Goal: patient unable to state  Goals to be met by: 04/13/21  Patient will transition from supine to sit: Independent  Patient will transition from  sit to supine: Independent  Patient will transfer from sit to stand: Supervision  Patient will ambulate: Contact Guard assistance, distance (in feet)  Distance (in feet): 200 ft  Long-term goal to be met by: 04/20/21  Long Term Goal : Pt will ambulate 200 ft with 100 % obstacle on the right side    Patient/Family  Education  Educated patient on the role of physical therapy, goals, plan of care, importance of increased activity, discharge recommendations, transfer training and gait training and fall prevention strategies, including need for supervision/ assistance with OOB activity and use of call light; patient needed cues and will need reinforcement. Handout(s) issued: none.    Plan  Plan  Treatment/Interventions: Patient/family training, Compensatory technique education, Gait training, Continued evaluation, Therapeutic Activity, Neuromuscular Reeducation  PT Frequency: minimum 2x/week    The plan of care and recommendations assesses the patient's and/or caregiver's readiness, willingness, and ability to provide or support functional mobility and ADL tasks as needed upon discharge.      Time  Start Time: 0830  Stop Time: 0840  Time Calculation (min): 10 min    Charges       $Gait/Mobility: 8-22 mins                   Problem List  Patient Active Problem List   Diagnosis   ??? Personal history of other specified diseases(V13.89)   ??? Gastroesophageal reflux disease without esophagitis   ??? Abnormal liver enzymes   ??? Hiatal hernia   ??? 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 Ischemic stroke (CMS-HCC)   ??? 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 Essential hypertension   ??? Type 2 diabetes mellitus with circulatory disorder (HCC)   ??? Chronic obstructive pulmonary disease (CMS-HCC)   ??? Spondylosis of cervical region without myelopathy or radiculopathy   ??? Screen for colon cancer   ??? Lumbar radiculopathy   ??? Cerebrovascular accident (CVA) due to occlusion of left middle cerebral artery (CMS-HCC)   ??? Acute ischemic left MCA stroke (CMS-HCC)        Past Medical History  Past Medical History:   Diagnosis Date   ??? COPD (chronic obstructive pulmonary disease) (CMS-HCC)    ??? Diabetes mellitus (CMS-HCC)    ??? Dysphagia    ??? GERD  (gastroesophageal reflux disease)    ??? Hematuria    ??? Hepatitis    ??? Hypertension         Past Surgical History  Past Surgical History:   Procedure Laterality Date   ??? ESOPHAGOGASTRODUODENOSCOPY N/A 11/19/2013    Procedure: ESOPHAGOGASTRODUODENOSCOPY WITH MAC;  Surgeon: Charlane Ferretti, MD;  Location: [facility] ENDOSCOPY;  Service: Gastroenterology;  Laterality: N/A;   ??? FOOT SURGERY Left    ??? HERNIA REPAIR      about 5 yrs ago

## 2021-04-06 NOTE — Unmapped (Signed)
[name],  Here are your hospital discharge instructions:  --> You were hospitalized for: left sided stroke affecting language    --> Medication changes:   1) For blood pressure please only take clonidine patch 0.2mg  and valsartan 80mg  daily   2) For stroke prevention please take aspirin 81mg  and plavix 75mg  until 06/26/2021   3) On 06/26/2021 please stop taking plavix and start taking aspirin 325mg  daily    --> Other Instructions:   1) Please follow-up with our stroke team below   2) Please try to stop smoking! Patches and gum can be provided to you.     Thank you, Neurology 2 Team    Future Appointments   Date Time Provider Department Center   07/23/2021 11:00 AM Burnett Harry, MD [name] NEUR GNI [facility]   11/02/2021  9:00 AM Ernestine Mcmurray, MD [facility] PULM HOL HOL       Recent Lab Values for you and your doctors:  No results for input(s): TROPONINI, CKMB, BNP in the last 72 hours.  Recent Labs     04/03/21  2249 04/04/21  1202   NA 135 134   K 4.5 4.4   CL 102 104   CO2 24 22   BUN 20 21   CREATININE 1.32* 1.13   GLUCOSE 106* 103*   CALCIUM 9.8 9.3   MG 2.1 2.0   PHOS 4.0 3.9     Recent Labs     04/03/21  2249 04/04/21  1202   WBC 7.9 7.1   HGB 14.5 13.5   HCT 41.5 38.3*   PLT 238 229     No results for input(s): AST, ALT, ALKPHOS, BILITOT, BILIDIRECT in the last 72 hours.    Invalid input(s): LABALBU  No results for input(s): CHOLTOT, TRIG, HDL, CHOLHDL, LDL in the last 72 hours.    Invalid input(s): VLDCHOL  No results for input(s): VITAMINB1, FOLATE in the last 72 hours.

## 2021-04-06 NOTE — Unmapped (Signed)
Occupational Therapy  Treatment     Name: [name]  DOB: [birth year]  Attending Physician: Ebony Hail, MD  Admission Diagnosis: Cerebrovascular accident (CVA) due to occlusion of left middle cerebral artery (CMS-HCC) [I63.512]  Acute ischemic left middle cerebral artery (MCA) stroke (CMS-HCC) [I63.512]  Date: 04/06/2021  Room: 1610/R6045  Reviewed Pertinent hospital course: Yes   Hospital Course PT/OT: 66 yo male presents to ED with altered speech. 3/1: HCT- evolving L MCA infarct, CTA- no acute findings, CTP- moderate area of hypoperfusion in the L MCA territory with a central ischemic core of 25 mL and a 48 mL penumbra, s/p cerebral angio- distal L M2 occlusion with severe stenosis and no intervention, 3/4: MRI -Acute infarct involving the left parietal-temporal region  Relevant PMH : L MCA stroke (L M3 occlusion s/p tPA in 2017), HTN, DMII, R parotid mass (pending excision), COPD  Precautions: none  Activity Level: Activity as tolerated    Aide: n/a    Recommendation  Recommendation: IP Rehab  Equipment Recommendations: Defer at this time      Assessment  Assessment: Decreased activity tolerance, Decreased Functional Mobility, Decreased fine motor control, Decreased ADL status, Decreased Safe judgment during ADL, Decreased self-care transfers, Decreased Balance, Decreased IADLs, Decreased cognition                Prognosis for OT goals: Good                        Pt tolerates OT tx session fairly on this date and cooperative to participate upon arrival. Pt presents with aphasia limiting ability to assess orientation, but pt shakes head yes to VU of place, time and situation. Pt edu: discharge recommendation and continuation of OT services to improve IND w/ ADL/mobility tasks for safe return to home env't. Pt continues to require MinA intermittently with long household distances 2/2 R lateral LOB's and inattention to R visual field. Pt demo's below PLOF and would benefit from continued acute OT services  to improve cognition, balance, strength and activity tolerance to increase IND with ADL/IADL and mobility tasks to ensure a safe return to pt's baseline function.    Outcome Measures  AM-PAC 6 Clicks Daily Activity Inpatient Short Form: OT 6 Clicks Score: 17     Cognition  Overall Cognitive Status: Impaired  Cognitive Assessment: Insight  Arousal/Alertness: Alert  Orientation Level: Other (pt regards to name and re-oriented to time, place and situation. pt unable to carryover and presents with aphasia limiting communication)  Behavior: Apprehensive;Distracted;Cooperative  Following Commands: Follows one step commands;Requires repetition of instruction;Requires verbal cues  Safety Judgment: Decreased safety awareness;Impaired judgment;Decreased awareness of need for assistance  Insight: Demonstrated decreased insight into limitations and abilities to complete ADLs safely;Decreased awareness of need for assistance  Comments: Pt continues to require cueing for increased attention to the right for safety throughout functional mobility    Pain  Pain Score:  (pt states yes to pain but unable to locate/identify location)  Pain Intervention(s): Ambulation/increased activity      Functional Mobility  Bed Mobility  Supine to Sit: Supervision;head of bed flat;towards the right  Sit to Supine: Supervision;towards the left;head of bed flat  Functional Transfers  Sit to Stand: Stand-by assistance (From EOB x2)  Stand to Sit: Stand-by assistance  Functional Mobility: Contact guard assistance;Minimal assistance  Functional Mobility Comment: Pt completes task of long household distance requiring CGA up to MinA intermittently 2/2 pt demo'ing R lateral LOB's requiring verbal  cues to maintain midline  Balance  Sitting - Static: Supervision  Sitting - Dynamic: Supervision  Standing - Static: Contact Guard Assistance  Standing - Dynamic: Minimal Assistance;Contact Guard Assistance    ADL  Grooming: Supervision  Grooming Deficit:  Set-up;Wash/dry hands  Location Assessed Grooming: Seated edge of bed  Grooming Deficit Additional Comments: S/U of hand sanitizer  Lower Body Dressing: Minimal assistance  Lower Body Dressing Deficit: Don/doff pants;Set-up;Fasteners  Location Assessed LE Dressing: Seated edge of bed;Standing edge of bed  Toileting: Contact guard assistance  Toileting Deficit: Clothing management up;Clothing management down;Toileting hygiene  Location Assessed Toileting: Toilet       Position after Treatment/Safety Handoff  Position after therapy session: Bed  Details: RN notified;Call light/ needs within reach  Alarms: Bed  Alarms Status: Activated and interfaced unchanged from previous settings    Goals     Goals to be met in: 1 week  Patient stated goal: to decrease pain during mobility and ADLs, to increase activity, to increase independence  Patient will complete supine to sit in prep for ADLs: Independent (Goal met/upgraded 3/10)  Patient will complete functional chair transfer: Contact Guard assistance  Patient will complete toilet transfer: Will tolerate assessment  Patient will complete grooming task: Will tolerate assessment (standing sinkside)  Patient will complete upper body dressing: Contact Guard assistance  Patient will complete lower body dressing: Contact Guard assistance  Pt Will tolerate completeing ADLs with pain less than 4/10: .  Pt Will participate in upper extremity HEP to prep for ADLs: .  Miscellaneous Goal #1: Pt will demonstrate 3 instances of R body awareness within session with mod verbal cueing.  Miscellaneous Goal #2: Pt will participate in functional cognition assessment and upgrade  Long Term Goal : Pt will participate in IADL task assessment and upgrade  Long term goal to be met in: 2 weeks  Collaborated with: Patient    Plan  Plan  Treatment Interventions: ADL retraining, Activity Tolerance training, Energy Conservation, IADL retraining, VP retraining, Cognitive reorientation, Fine motor  coordination activities, Patient/Family training, Functional transfer training, UE strengthening/ROM, Therapeutic Activity, Compensatory technique education  OT Frequency: minimum 3x/week  The plan of care and recommendations assesses the patient's and/or caregiver's readiness, willingness, and ability to provide or support functional mobility and ADL tasks as needed upon discharge.    Patient/Family Education  Educated patient on the role of occupational therapy, OT goals, OT plan of care, discharge recommendation, ADL training, functional mobility training and the importance of safety and fall prevention strategies including need for supervision/ assistance with OOB activity and use of call light. patient  needed cues, will need reinforcement and unable to verbalize/demonstrate understanding.    OT Time  Start Time: 1409  Stop Time: 1432  Time Calculation (min): 23 min    OT Charges     $Therapeutic Activity: 8-22 mins  $Self Care/ADL/Home Management Training: 8-22 mins           Problem List  Patient Active Problem List   Diagnosis   ??? Personal history of other specified diseases(V13.89)   ??? Gastroesophageal reflux disease without esophagitis   ??? Abnormal liver enzymes   ??? Hiatal hernia   ??? 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 Ischemic stroke (CMS-HCC)   ??? 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 Essential hypertension   ??? Type 2 diabetes mellitus with  circulatory disorder (HCC)   ??? Chronic obstructive pulmonary disease (CMS-HCC)   ??? Spondylosis of cervical region without myelopathy or radiculopathy   ??? Screen for colon cancer   ??? Lumbar radiculopathy   ??? Cerebrovascular accident (CVA) due to occlusion of left middle cerebral artery (CMS-HCC)   ??? Acute ischemic left MCA stroke (CMS-HCC)     Past Medical History  Past Medical History:   Diagnosis Date   ??? COPD (chronic obstructive pulmonary disease)  (CMS-HCC)    ??? Diabetes mellitus (CMS-HCC)    ??? Dysphagia    ??? GERD (gastroesophageal reflux disease)    ??? Hematuria    ??? Hepatitis    ??? Hypertension      Past Surgical History  Past Surgical History:   Procedure Laterality Date   ??? ESOPHAGOGASTRODUODENOSCOPY N/A 11/19/2013    Procedure: ESOPHAGOGASTRODUODENOSCOPY WITH MAC;  Surgeon: Charlane Ferretti, MD;  Location: [facility] ENDOSCOPY;  Service: Gastroenterology;  Laterality: N/A;   ??? FOOT SURGERY Left    ??? HERNIA REPAIR      about 5 yrs ago

## 2021-04-06 NOTE — Unmapped (Signed)
[facility]   Medical Nutrition Therapy  Follow-Up    Diet Order/Nutrition Support: Dysphagia Level 3: Advanced Thin (Pre-select please due to language impairment); Boost Glucose Control - TID   ??  Chief Complaint: Acute ischemic left MCA stroke   ??  Pertinent Information: [name] is a 66 year old amle with PMH of L MCA strokes (most recent 03/18/21), HTN, and T2DM, who presented with acute onset of recurrent expressive aphasia. He was admitted to the neurology inpatient service for acute ischemic stroke. Planning for pt to d/c to IPR pending placement per CC note.    Weights reviewed, appears stable. PO intake improved over the last 3 days, now PO intake ranging 75-100% of meals. Will continue to monitor. +BM 3/08    LOS: 9 days  Scheduled Meds:   ??? ARIPiprazole  5 mg Oral Daily 0900   ??? aspirin  81 mg Oral Daily with breakfast   ??? atorvastatin  80 mg Oral Nightly (2100)   ??? cloNIDine  1 patch Transdermal Weekly   ??? clopidogreL  75 mg Oral Daily 0900   ??? heparin  5,000 Units Subcutaneous 3 times per day   ??? nicotine  1 patch Transdermal Daily 0900   ??? tamsulosin  0.4 mg Oral Nightly (2100)   ??? valsartan  80 mg Oral Daily 0900      Continuous Infusions:   PRN Meds:acetaminophen, hydrALAZINE, nicotine **AND** nicotine (polacrilex), ondansetron     Pertinent Labs:   Lab Results   Component Value Date    CREATININE 1.13 04/04/2021    BUN 21 04/04/2021    NA 134 04/04/2021    K 4.4 04/04/2021    CL 104 04/04/2021    CO2 22 04/04/2021     Lab Results   Component Value Date    ALBUMIN 3.9 04/04/2021     Lab Results   Component Value Date    CALCIUM 9.3 04/04/2021    PHOS 3.9 04/04/2021     Lab Results   Component Value Date    MG 2.0 04/04/2021     Lab Results   Component Value Date    POCGMD 100 03/29/2021     Lab Results   Component Value Date    HGBA1C 5.8 (H) 03/20/2021     Weight History:  Wt Readings from Last 5 Encounters:   04/05/21 179 lb (81.2 kg)   03/19/21 180 lb (81.6 kg)    02/05/21 181 lb (82.1 kg)   01/12/21 177 lb (80.3 kg)   01/05/20 190 lb (86.2 kg)     Established Estimated Nutrition Needs:  Needs based On:??81.6 kg - last known weight  Kcals/day:??2040-2448 (25-30 kcal/kg)  Protein g/day:??82-98 (1-1.2 g/kg)  Carbohydrate g/day:??45-55% of total calories  Fluid ml/day:??~1 ml/kcal or per MD    Established Nutrition Diagnosis  Nutrition Diagnosis:??Inadequate oral intake  Related To:??AMS, dysphagia  As Evidenced By:??Need for modified diet texture??    Established Nutrition Intervention: Add/Change Medical Food Supplement/Snack and Monitor PO Intake/Tolerance  Established Goals: Total energy intake improved as evidenced by PO intake at least 50-75% of meals/supplements/snacks within 3-4 days  Goals: Met - ongoing    Additional Nutrition Related Problems  Not Applicable    Additional Recommended Interventions: Monitor PO Intake/Tolerance  Additional Goals:Total energy intake improved as evidenced by PO intake at least 75% of meals/supplements/snacks within 3-5 days    Nutrition Status Classification: Moderately Compromised    Discharge Planning and Education: Discharge plan of care  for nutrition pending clinical course.    Follow up per nutrition services protocol while inpatient.    Additional Recommendation(s) to Physicians:   ?? Monitor PO intake, weight, nutrition-related labs, and POC    Clovis Fredrickson, RD, LD  Clinical Dietitian  Pager# [phone]

## 2021-04-07 LAB — CBC
Hematocrit: 33.2 % (ref 38.5–50.0)
Hemoglobin: 11.7 g/dL (ref 13.2–17.1)
MCH: 32 pg (ref 27.0–33.0)
MCHC: 35.2 g/dL (ref 32.0–36.0)
MCV: 90.9 fL (ref 80.0–100.0)
MPV: 8.8 fL (ref 7.5–11.5)
Platelets: 257 10*3/uL (ref 140–400)
RBC: 3.65 10*6/uL (ref 4.20–5.80)
RDW: 13.7 % (ref 11.0–15.0)
WBC: 7.2 10*3/uL (ref 3.8–10.8)

## 2021-04-07 LAB — COMPREHENSIVE METABOLIC PANEL
ALT: 15 U/L (ref 7–52)
AST: 19 U/L (ref 13–39)
Albumin: 3.9 g/dL (ref 3.5–5.7)
Alkaline Phosphatase: 67 U/L (ref 36–125)
Anion Gap: 7 mmol/L (ref 3–16)
BUN: 22 mg/dL (ref 7–25)
CO2: 26 mmol/L (ref 21–33)
Calcium: 9.4 mg/dL (ref 8.6–10.3)
Chloride: 103 mmol/L (ref 98–110)
Creatinine: 1.12 mg/dL (ref 0.60–1.30)
EGFR: 73
Glucose: 94 mg/dL (ref 70–100)
Osmolality, Calculated: 285 mOsm/kg (ref 278–305)
Potassium: 4.4 mmol/L (ref 3.5–5.3)
Sodium: 136 mmol/L (ref 133–146)
Total Bilirubin: 0.8 mg/dL (ref 0.0–1.5)
Total Protein: 6.7 g/dL (ref 6.4–8.9)

## 2021-04-07 LAB — PHOSPHORUS: Phosphorus: 4 mg/dL (ref 2.1–4.5)

## 2021-04-07 LAB — VITAMIN D 25 HYDROXY: Vit D, 25-Hydroxy: 16.5 ng/mL (ref 30.0–100.0)

## 2021-04-07 LAB — MAGNESIUM: Magnesium: 2.1 mg/dL (ref 1.5–2.5)

## 2021-04-09 LAB — CBC
Hematocrit: 34.3 % (ref 38.5–50.0)
Hemoglobin: 11.9 g/dL (ref 13.2–17.1)
MCH: 31.9 pg (ref 27.0–33.0)
MCHC: 34.8 g/dL (ref 32.0–36.0)
MCV: 91.7 fL (ref 80.0–100.0)
MPV: 8.6 fL (ref 7.5–11.5)
Platelets: 298 10*3/uL (ref 140–400)
RBC: 3.74 10*6/uL (ref 4.20–5.80)
RDW: 13.7 % (ref 11.0–15.0)
WBC: 6.7 10*3/uL (ref 3.8–10.8)

## 2021-04-09 LAB — BASIC METABOLIC PANEL
Anion Gap: 8 mmol/L (ref 3–16)
BUN: 17 mg/dL (ref 7–25)
CO2: 28 mmol/L (ref 21–33)
Calcium: 9.6 mg/dL (ref 8.6–10.3)
Chloride: 102 mmol/L (ref 98–110)
Creatinine: 1.2 mg/dL (ref 0.60–1.30)
EGFR: 67
Glucose: 125 mg/dL (ref 70–100)
Osmolality, Calculated: 289 mOsm/kg (ref 278–305)
Potassium: 3.6 mmol/L (ref 3.5–5.3)
Sodium: 138 mmol/L (ref 133–146)

## 2021-04-11 NOTE — Unmapped (Signed)
Contacted [facility] to attempt to enroll patient for the cardiac event monitor. RN stated that he is due to discharge on 3/20. Monitor will not arrive on time for discharge. We will call patient directly after 3/20 to set up.

## 2021-04-14 DIAGNOSIS — I63512 Cerebral infarction due to unspecified occlusion or stenosis of left middle cerebral artery: Secondary | ICD-10-CM

## 2021-04-16 LAB — BASIC METABOLIC PANEL
Anion Gap: 8 mmol/L (ref 3–16)
BUN: 22 mg/dL (ref 7–25)
CO2: 27 mmol/L (ref 21–33)
Calcium: 9.7 mg/dL (ref 8.6–10.3)
Chloride: 103 mmol/L (ref 98–110)
Creatinine: 1.02 mg/dL (ref 0.60–1.30)
EGFR: 82
Glucose: 101 mg/dL — ABNORMAL HIGH (ref 70–100)
Osmolality, Calculated: 289 mosm/kg (ref 278–305)
Potassium: 3.9 mmol/L (ref 3.5–5.3)
Sodium: 138 mmol/L (ref 133–146)

## 2021-04-16 LAB — CBC
Hematocrit: 27.6 % (ref 38.5–50.0)
Hemoglobin: 9.7 g/dL (ref 13.2–17.1)
MCH: 32 pg (ref 27.0–33.0)
MCHC: 35.2 g/dL (ref 32.0–36.0)
MCV: 90.8 fL (ref 80.0–100.0)
MPV: 8.3 fL (ref 7.5–11.5)
Platelets: 280 10*3/uL (ref 140–400)
RBC: 3.04 10*6/uL (ref 4.20–5.80)
RDW: 13.9 % (ref 11.0–15.0)
WBC: 7.3 10*3/uL (ref 3.8–10.8)

## 2021-04-16 NOTE — Unmapped (Signed)
Result Type: Physician Discharge Summary  Result Date: April 16, 2021 11:28 EDT  Result Status: Auth (Verified)  Result Title/Subject: Discharge Summary  Performed By/Author: Jon Billings MD, Trey Sailors on April 16, 2021 11:36 EDT  Verified By: Jon Billings MD, Trey Sailors on April 16, 2021 11:36 EDT  Encounter info: [phone], [facility], Inpatient, 04/06/2021 -     * Final Report *  Discharge Summary     Patient:   [name], [name]             MRN: [id number]            FIN: [id number]               Age:   66 years     Sex:  Male     DOB:  09-14-1955   Associated Diagnoses:   None   Author:   Jon Billings MD, Idalia Needle E      Admission Date: 04/07/2021  Discharge Date: 04/16/2021  Attending Physician: Dr. Dorothea Ogle    Reason for Admission: L MCA Stroke     History of Present Illness: 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 History reveals new onset aphasia upon waking up, initial NIHSS at hospital of 6. CTH showed evolving L MCA infarct similar to prior with CTA revealing left M2 occlusion. Not a thrombectomy candidate. Hospitalization course with some occasional agitation and somnolence/lack of motivation. Patient medically cleared for discharged on 04/07/21 and determined to be good IPR candidate.    Physical examination:   General: well developed, well nourished, in no acute distress, lying in bed.   HEENT: normocephalic, atraumatic, poor dentition, moist mucous membranes  Lungs:  no increased work of breathing on room air. Lungs clear to auscultation bilaterally.   Cardiac: RRR. No MRG. well perfused, No LE edema  Abdomen:, non-distended, soft, non-tender   Extremities: no cyanosis, clubbing, edema. Able to move all 4 extremities spontaneously, walking independently.   Neurologic:  Mental status: awake; Inconsistent with one  word verbalizations or head nods; expressive and receptive aphasia  Cranial nerves: EOMI, mild facial droop noted  Coordination: No tremor noted  Psychiatric: cooperative, appropriate mood and affect     Hospital Course:   Stroke Rehabilitation/Prevention: Participated in therapies well. Continues with significant cognitive-communication deficits impacting independence with safety, problem solving, ADLs and will need close supervision for safety upon discharge- have been discussing with family as has a high risk of readmission due to limited insight, communication and safety. Discharged on regular diet with thins, vitamin D supplementation. Maintained on Lipitor 80mg  QHS and  ASA 81mg  + Plavix 75mg  while admitted. Will transition to ASA 325mg  (stop ASA 81mg  & plavix) on 06/26/21.   HTN: Continued on valsartan 80mg  daily + clonidine patch 0.2mg  patches (changed Mondays)   Bowel/Bladder: Continent of both. Maintained on Flomax 0.4mg  QHS + Miralax 17gm daily.   Depression: continued abilify 5mg  daily  T2DM: A1c within goal (5.8), glucoses <160 so discontinued FSGS.   Hx Tobacco Use: Nicotine patches daily     Disposition at Discharge: Discharge home to family 24/7 supervision.      ADL Status  GG0130 Eating: Independent - 06 (04/16/21)  GG0130 Oral Hygiene: Independent - 06 (04/15/21)  GG0130 Upper Body Dressing: Independent - 06 (04/15/21)  GG0130 Lower Body Dressing: Independent -  06 (04/15/21)  GG0130 Toileting Hygiene: Independent - 06 (04/16/21)  GG0170 Toilet Transfer: Independent - 06 (04/16/21)  GG0130 Shower, Bathe Self: Independent - 06 (04/15/21)     Mobility Functional Status  GG0170 Lying to Sitting Side of Bed Goal: Independent - 06 (04/15/21)  GG0170 Sit to Stand: Independent - 06 (04/16/21)  GG0170 Chair,Bed to Chair Transfer: Independent - 06 (04/16/21)  Ambulation- Level Adaptive Equipment: No equipment (04/08/21)  Ambulation- Level Distance: 150 ft (04/08/21)  Ambulation- Level Comments: Pt amb x4  trials of approx 150' with no device, CGA initially progressing to SBA, R neglect noted, requires cues for obstacle avoidance on R however pt unable to respond to cues frequently, resistant to tactile cueing/physical assistance to correct (04/08/21)  GG0170 Walk 10 Feet: Independent - 06 (04/16/21)  GG0170 Walk 50 Feet with Two Turns: Independent - 06 (04/16/21)  GG0170 Walk 150 Feet: Independent - 06 (04/16/21)  WC Mobility- Level Comments: Dependent for all w/c propulsion, pt unable to initiate task d/t lack of understanding (04/08/21)     Speech/Language & Cognition Functional Status  Comprehension Mode Functional Status: Auditory, Visual (04/06/21)  Ability to Hear: Minimal difficulty - difficulty in some environments (e.g. when a person speaks softly or setting is noisy) (04/07/21)  Visual Acuity: Adequate - sees fine detail, such as regular print in newspapers/books (04/07/21)  Health Literacy SILS: Sometimes (04/16/21)  BB0700 Expression of Ideas and Wants: Frequently exhibits difficulty with expressing needs and ideas - 2 (04/16/21)  BB0800 Understanding Verbal Content: Sometimes understands - Understands only basic conversations or simple, direct phrases. Frequently requires cues to understand - 2 (04/16/21)  Expression Mode Functional Status: Vocal, Nonvocal (04/06/21)  Person Orientation IP: Dependent (04/09/21)  Place Orientation IP: Dependent (04/09/21)  Time Orientation IP: Dependent (04/09/21)  Situation Orientation IP: Dependent (04/09/21)  Biographical Information Orientation IP: Dependent (04/09/21)  Attention Functional Status - OT: Dependent (04/15/21)  Attention Functional Status - SLP: Sometimes Independent (04/11/21)  Memory Functional Status - OT IP: Sometimes Independent (04/15/21)  Memory Functional Status - SLP IP: Sometimes Independent (04/11/21)  Safety Awareness/Insight - OT: Dependent (04/15/21)  Safety Awareness/Insight - SLP: Sometimes Independent (04/11/21)  Simple Problem Solving  Func Status OT: Sometimes Independent (04/15/21)  Simple Problem Solving Func Status SLP: Dependent (04/11/21)  Complex Problem Solving Func Status OT: Dependent (04/15/21)  Complex Problem Solving Func Status SLP: Dependent (04/11/21)  Communication/Cognition Descriptors: Decreased response length, Needs coaxing or encouragement, Requires increased time, Requires quiet environment, Requires repetition, Requires verbal redirection to tasks (04/16/21)  Communication/Cognition Equipment: Simple communication board, Other: yes/no sheet available in room (04/08/21)       Discharge Medications:      Home Medications (12) Active  acetaminophen 325 mg oral tablet 650 mg = 2 tab, PRN, Oral, q6hr  Adult Aspirin 325 mg oral tablet 325 mg = 1 tab, Oral, Daily  ARIPiprazole 5 mg oral tablet 5 mg = 1 tab, Oral, Daily  aspirin 81 mg oral tablet, chewable 81 mg = 1 tab, Oral, Daily  atorvastatin 40 mg oral tablet 80 mg = 2 tab, Oral, QHS  cholecalciferol 125 mcg (5000 intl units) oral capsule 125 mcg = 1 tab, Oral, Daily  cloNIDine 0.2 mg/24 hr transdermal film, extended release 1 patch, Transdermal, qMON  losartan 50 mg oral tablet 50 mg = 1 tab, Oral, Daily  Nicoderm C-Q Clear 14 mg/24 hr transdermal film, extended release 1 patch, Transdermal, Daily  Plavix 75 mg oral tablet 75 mg = 1 tab, Oral, Daily  polyethylene glycol 3350 oral powder for reconstitution 17 gm, Oral, Daily  tamsulosin 0.4 mg oral capsule 0.4 mg = 1 cap, Oral, QHS                   Signature Line  Electronically Authenticated on  04/16/2021 11:36 AM  ____________________________________________________  Arlan Organ,  MD    ____________________________________________________  Dorothea Ogle,  DO

## 2021-04-17 NOTE — Unmapped (Signed)
Contacted patient to set up cardiac event monitor. Patients significant other who is a contact for him stated that we could ship monitor directly to their home. Address verified.

## 2021-05-11 ENCOUNTER — Encounter

## 2021-05-11 NOTE — Unmapped (Signed)
Pt no show/no call for OT evaluation today.    Audry Riles, OTR/L

## 2021-05-11 NOTE — Unmapped (Signed)
OUTPATIENT THERAPY NO SHOW/ CANCEL NOTE    The Outpatient Therapy Attendance Policy which states 2 consecutive no-shows or 3 missed sessions within the last 30 days may result in discharge from therapy. Cancellation less than 24 hours before the appointment time is considered a no-show. Additionally, the policy states therapy may be unable to be provided if patient arrives late by 15 minutes or more.      DATE Type of Cancellation Telephone Contact?   05/11/2021 No show for evaluation Yes left a message on significant other's phone.  Reminded of next appointment scheduled at the [facility].                 [name]  05/11/2021

## 2021-05-15 ENCOUNTER — Encounter

## 2021-05-15 ENCOUNTER — Encounter: Admit: 2021-05-15 | Discharge: 2021-05-15 | Payer: Medicare (Managed Care)

## 2021-05-15 DIAGNOSIS — I69320 Aphasia following cerebral infarction: Secondary | ICD-10-CM

## 2021-05-15 NOTE — Unmapped (Addendum)
OUTPATIENT SPEECH LANGUAGE PATHOLOGY EVALUATION    Patient:  [name] DOB:  March 10, 1955  MRN:  [id number]    Date: 05/15/2021 Onset Date:  S/p CVA 03/21/2021 and 03/28/2021 Prior Therapy:  IP rehab and OP therapy after last CVA in 2017    Medical/Rehab Diagnosis: Aphasia s/p CVA    PMH: Reviewed past medical history in the chart.      Reason for referral:  Patient was referred by Dr Timoteo Ace for ST eval and treatment due to aphasia.    PLF/current complaints: Pt arrived with his girlfriend, [place].  Pt lives with girlfriend,  daughter Jaclynn Major who is 32 years old) and girlfriend's sister.  Pt's girlfriend takes care of finances, medication, and cooking and cleaning.    Complaints:  Pt confuses his yes/no responses and  generally says yes. Pt also has difficulties with reading and writing.  Pt does say the correct words sometimes but has difficulties.      Vision Glasses,  Wears readers and reports right visual deficits   Hearing normal   Hand Dominance Right   Dentition Missing teeth (front and bottom) has a partial   Pain (Location/Severity/Intervention) denies pain   Assessment: Pt interview;      Assessment: Pt interview, MSAST   Expressive Language: Severe.  Verbal communication is marked with jargon and perseverations   Receptive Language: Severe reading and auditory comprehension difficulties   Speech/Voice: Suspect apraxia   Cognition: DNT   Swallowing: Reports no difficulties   OM exam:  Unable to complete due to oral apraxia     The MS Aphasia Screening Test was administered to assess the pt's basic expressive and receptive language skills.  The pt scored the following:   Expressive Index  Naming  0/10  Automatic Speech  2/10  Repetition  0/10  Writing/Spelling 0/10  Verbal Fluency Dictation 5/10  Expressive Language Subscale 7/50    Receptive Index  Yes/No Responses   10/20 (given visual yes/no to point to)  Object Recognition Field of Five 2/10   Following Instructions  0/10   Reading Instructions    0/10  Receptive Language Subscale   12/50   Total Language Score   19/100    Comments: Pt able to complete Three strikes and your...out counted to 10 to 80% with initial model and saying it with therapist.    Written cues were the most helpful in improving verbal output however only improved to 40%      Medications reviewed in Epic on 07/27/2019.    Method for taking po medication:  Whole with thin liquid    Instrumental Dysphagia Exam:  No, Why not?  Report no swallowing difficulties    Education Activities and Response: Pt educated re: role of ST and purpose of therapy. They participated in the development of the POC and verbalized understanding and agreement of POC, goals.    Learning Assessment:  [name] is unable to communicate with therapist and / or verbalize understanding of directions / instructions due to the following barriers to learning:  aphasia and Is an interpreter required?  No    Patient/Family goal:  Girlfriend stated she would like to improve his ability to communicate.    Functional Deficits:   Receptive language--  severe  Expressive language--  Severe  Verbal Apraxia--  severe  Assessment: Pt presents with severe global aphasia    Potential Benefits of Treatment: Improve basic communication and educate loved ones on communication strategies    Barriers To Progress: severity of deficits    Rehab Potential:   fair  and Due to:   level of impairment     Outpatient Speech Language Pathology Long-term Treatment Goals    Auditory Comprehension  Pt will answer simple yes/no questions with use of strategies and max cues to 70%    Verbal Expression  Pt will repeat functional words with max cues to 60%    Reading Comprehension   Pt will match picture (f=2)  to word (f=1) to 70%    Written Expression  Pt will copy functional words to 70% acc.    Alternative Augmentative Communication  Pt will  use a simple communication system in order to communicate simple basic needs to 70% with max cues.    Education  Patient/caregivers will demonstrate understanding of role of speech therapy diagnosis, prognosis, home exercise programs, plan of care, recommendations, and safety precautions.        Plan:  Language Therapy  Frequency: 2 times per week for 10 weeks    Total Minutes: 40  Charge: 92523    POC Treatment Areas CPT Codes POC Assessment Areas CPT Codes   Speech, Language and VoiceTx Codes [x]  92507   Speech, Language and Voice Assessment Codes []  92521  []  92522  []  92523  []  92524  []  96105  []  92616  []  92511    Dysphagia Tx Codes []  92526   Dysphagia Assessment Codes []  92610  []  92611  []  92612   Cognition Tx Codes []  16109  []  97130   Cognition Assessment Codes []  96125   AAC Tx Codes [x]  92609   AAC Assessment Codes [x]  92607  [x]  60454   Group Tx Codes []  09811           Therapist Signature:  [name] MA CCC-SLP [phone]  Phone number [phone]  Email: [email or web address]     Date: 05/15/2021    Physician Certification:   I certify that the above patient is under my care and requires the above services.  These professional services are to be provided from an established plan, related to the diagnosis and reviewed by me every 10th visit or every 90 days, whichever occurs first.    Physician Comments/Revisions:    Physician Name (printed):   ____________________________  Physician Signature: ___________________________    Date:__________________

## 2021-05-18 ENCOUNTER — Encounter

## 2021-05-21 ENCOUNTER — Inpatient Hospital Stay: Admit: 2021-05-21 | Discharge: 2021-06-07 | Payer: Medicare (Managed Care)

## 2021-05-21 DIAGNOSIS — I63512 Cerebral infarction due to unspecified occlusion or stenosis of left middle cerebral artery: Secondary | ICD-10-CM

## 2021-05-21 NOTE — Unmapped (Signed)
AMBULATORY CARDIAC TELEMETRY MONITOR  Ordering Physician:  Audelia Hives, MD  Technician:  Ozzie Hoyle      Indications:  CVA    Date:    From: 04/29/2021 To: 05/15/2021    Quality of Tracing:  good   Basic Rhythm:  sinus  Events/Symptoms:  No symptoms reported during the monitoring period    CONCLUSION:  The recording was limited to 2 days, 21 hours, and 55 minutes. The rhythm was sinus with minimum rate 59 beats per minute at 3:54 AM and maximum rate 140 beats per minute at 5:25 PM. The patient triggered 2 recordings which corresponded to sinus rhythm at 79 and 86 beats per minute. There was a 6-beat run of ventricular tachycardia at 157 beats per minute at 6:30 PM on 05/01/2021. There were 34 premature ventricular complexes and 397 supraventricular premature complexes. There was no atrial fibrillation.    Interpreting MD:  Ron Agee, MD

## 2021-05-22 ENCOUNTER — Encounter

## 2021-05-22 ENCOUNTER — Encounter: Payer: Medicare (Managed Care) | Attending: Speech-Language Pathologist

## 2021-05-22 NOTE — Unmapped (Signed)
OUTPATIENT THERAPY NO SHOW/ CANCEL NOTE    The Outpatient Therapy Attendance Policy which states 2 consecutive no-shows or 3 missed sessions within the last 30 days may result in discharge from therapy. Cancellation less than 24 hours before the appointment time is considered a no-show. Additionally, the policy states therapy may be unable to be provided if patient arrives late by 15 minutes or more.      DATE Type of Cancellation Telephone Contact?   05/11/2021 No show for evaluation Yes left a message on significant other's phone.  Reminded of next appointment scheduled at the [facility].   05/22/2021 No show for therapy appointment, s/o called 10 minutes past start of session to cx No, s/o called late to cancel appointment            Georges Mouse, MS, CCC-SLP [phone]   05/22/2021

## 2021-05-25 ENCOUNTER — Encounter: Admit: 2021-05-25 | Discharge: 2021-05-25 | Payer: Medicare (Managed Care)

## 2021-05-25 ENCOUNTER — Encounter

## 2021-05-25 DIAGNOSIS — I69320 Aphasia following cerebral infarction: Secondary | ICD-10-CM

## 2021-05-25 NOTE — Unmapped (Signed)
OUTPATIENT SPEECH LANGUAGE PATHOLOGY  DAILY NOTE  Name:[name] DOB:1955-12-28 [id number]:[id number]  Today's Date: 05/25/2021  Referring Provider:Danielle Colleen CanMarie Shoreman, MD  [address]  [place],  [place] [id number]  Primary Diagnosis:s/p  CVA  Onset Date: 2/22 and 03/28/2021   Diagnosis:aphasia  Insurance Plan:Payor: Investment banker, operationalUNITED HEALTHCARE MANAGED MEDICARE / Plan: UHC MEDICARE HMO COMPLETE / Product Type: Medicare Mngd Care /   # visits per insurance authorization:   04/30/2021/TDC [id number]/[id number]/BENEFIT AUTH INFO  PT,OT,SP- PLAN [employer] DUAL COMP HMO  POL EFF DT: 02/28/2021  PT OT ST GETS UNLIMITED V BOMN PCY   MODALITY LIMIT? 4  OOP $8300   MET $63.37  CO-INS:  80%  NO YRLY/LTM/PRE-EXISTING   DRAKE IS IN NETWORK  PER: [employer]  PHONE# [phone]  Date of initial eval: 05/15/2021  # visits per POC: 2x/week x 10 weeks   Date of initial POC Certification: 05/15/2021  Verified Signed POC Certification:  Yes: by Dr Janan RidgePajor on 05/15/2021     Date of POC Recertification: n/a  Verified Signed POC Recertification:  Yes: n/a No: n/a Date: n/a  Date of Progress Report (every 10 visits):     POC Recertification due(Medicaid 60 days/Medicare 90 days): 08/14/2021      Goals:  Goal 05/25/2021       Pt will answer simple yes/no questions presented auditorily with use of strategies and max cues to 70% Personal y/n questions with use of written cues and repetition to 60%       Pt will repeat functional words with max cues to 60% 20% with no awareness of errors       Pt will match picture (f=2)  to word (f=1) to 70% Word (f=2) to picture (f=1) to 90%       Pt will copy functional words to 70% acc. 40%, traced names to 70%       Pt will use a simple communication system in order to communicate simple basic needs to 70% with max cues. n/a       Patient/caregivers will demonstrate understanding of role of speech therapy diagnosis, prognosis, home exercise programs, plan of care, recommendations, and safety precautions. ongoing                  ______________________________________________________________________  Visit #: 1   Charges: [id number]  Total Minutes: 42  Pt seen for language therapy with wife, Danny CopierBethany present.  Pt with severe receptive and expressive aphasia.  Minimal to no awareness of written or verbal errors.  Pt pointed to glasses and appeared to be asking about getting new glasses.  Pt's wife reported that pt has been running into things on the right and that his current glasses are over a year old. Requested pt to be scheduled with OT for evaluation (referral already placed) and to talk to MD about vision.      Education Addressed During This Session: visual changes after stroke and how vision can get better and reasoning to wait to get new glasses.    Homework:  Practice tracing names ([name], [name], and [name]) Practice reading at word level.    This note serves as a discharge summary if the patient does not return to Speech Therapy per the above POC.    Jola BaptistSherry E. Vanice Rappa MA CCC-SLP [phone]  Phone number [phone]  Email: [email or web address]

## 2021-05-29 ENCOUNTER — Encounter: Payer: Medicare (Managed Care) | Attending: Speech-Language Pathologist

## 2021-05-29 ENCOUNTER — Encounter

## 2021-05-29 NOTE — Unmapped (Signed)
OUTPATIENT THERAPY NO SHOW/ CANCEL NOTE    The Outpatient Therapy Attendance Policy which states 2 consecutive no-shows or 3 missed sessions within the last 30 days may result in discharge from therapy. Cancellation less than 24 hours before the appointment time is considered a no-show. Additionally, the policy states therapy may be unable to be provided if patient arrives late by 15 minutes or more.      DATE Type of Cancellation Telephone Contact?   05/11/2021 No show for evaluation Yes left a message on significant other's phone.  [facility]   05/22/2021 No show for therapy appointment, s/o called 10 minutes past start of session to cx No, s/o called late to cancel appointment   05/29/2021 No show for therapy appointment Yes, spoke with family member who stated she had a printed scheduled with a different time for the same date. SLP confirmed next SLP appointment date/time and asked family member to get a new printout and to make sure the pt's mychart account is set up.       [name], MS, CCC-SLP [phone]   05/29/2021

## 2021-06-01 ENCOUNTER — Encounter: Admit: 2021-06-01 | Discharge: 2021-06-01 | Payer: Medicare (Managed Care)

## 2021-06-01 ENCOUNTER — Encounter

## 2021-06-01 DIAGNOSIS — I69320 Aphasia following cerebral infarction: Secondary | ICD-10-CM

## 2021-06-01 NOTE — Unmapped (Signed)
OUTPATIENT SPEECH LANGUAGE PATHOLOGY  DAILY NOTE  Name:[name] DOB:1955-03-30 GNF:[id number]  Today's Date: 06/01/2021  Referring Provider:Danielle Colleen Can, MD  [address]  [place],  [place] [address]  Primary Diagnosis:s/p  CVA  Onset Date: 2/22 and 03/28/2021   Diagnosis:aphasia  Insurance Plan:Payor: Investment banker, operational HEALTHCARE MANAGED MEDICARE / Plan: UHC MEDICARE HMO COMPLETE / Product Type: Medicare Mngd Care /   # visits per insurance authorization:   04/30/2021/TDC [id number]/[id number]/BENEFIT AUTH INFO  PT,OT,SP- PLAN [employer] DUAL COMP HMO  POL EFF DT: 02/28/2021  PT OT ST GETS UNLIMITED V BOMN PCY   MODALITY LIMIT? 4  OOP $8300   MET $63.37  CO-INS:  80%  NO YRLY/LTM/PRE-EXISTING   DRAKE IS IN NETWORK  PER: [employer]  PHONE# [phone]  Date of initial eval: 05/15/2021  # visits per POC: 2x/week x 10 weeks   Date of initial POC Certification: 05/15/2021  Verified Signed POC Certification:  Yes: by Dr Janan Ridge on 05/15/2021     Date of POC Recertification: n/a  Verified Signed POC Recertification:  Yes: n/a No: n/a Date: n/a  Date of Progress Report (every 10 visits):     POC Recertification due(Medicaid 60 days/Medicare 90 days): 08/14/2021      Goals:  Goal 05/25/2021 06/01/2021      Pt will answer simple yes/no questions presented auditorily with use of strategies and max cues to 70% Personal y/n questions with use of written cues and repetition to 60% Personal y/n questions with repetition and written cues to 70%      Pt will repeat functional words with max cues to 60% 20% with no awareness of errors ~20%      Pt will match picture (f=2)  to word (f=1) to 70% Word (f=2) to picture (f=1) to 90% 100%  Word f=2 to picture f=1 to 80%      Pt will copy functional words to 70% acc. 40%, traced names to 70% Copying names to 30% (increased perseveration today, hand over hand to trace      Pt will use a simple communication system in order to communicate simple basic needs to 70% with max cues. n/a Using SGD I-110 to  select appropriate I cons with max cues and repetition to 65%      Patient/caregivers will demonstrate understanding of role of speech therapy diagnosis, prognosis, home exercise programs, plan of care, recommendations, and safety precautions. ongoing ongoing                ______________________________________________________________________  Visit #: 2   Charges: [id number]  Total Minutes: 55  Pt seen for language therapy with wife, [name] present.  Pt with severe receptive and expressive aphasia.  Difficulties with direct selection on device due to combination of dexterity difficulties as well as visual deficits.  Wife reports that she scheduled pt with eye doctor appointment.        Education Addressed During This Session: activities to complete at home.    Homework:  Practice tracing names ([place], [place], and [place]) Practice reading at word level., worksheets provided    Plan:  Provide handout to wife re aphasia and ways to best communicate.    This note serves as a discharge summary if the patient does not return to Speech Therapy per the above POC.    [name] MA CCC-SLP [phone]  Phone number [phone]  Email: [email or web address]

## 2021-06-05 ENCOUNTER — Encounter

## 2021-06-06 ENCOUNTER — Encounter: Payer: Medicare (Managed Care) | Attending: Rehabilitative and Restorative Service Providers"

## 2021-06-08 ENCOUNTER — Encounter

## 2021-06-08 NOTE — Unmapped (Signed)
OUTPATIENT THERAPY NO SHOW/ CANCEL NOTE    The Outpatient Therapy Attendance Policy which states 2 consecutive no-shows or 3 missed sessions within the last 30 days may result in discharge from therapy. Cancellation less than 24 hours before the appointment time is considered a no-show. Additionally, the policy states therapy may be unable to be provided if patient arrives late by 15 minutes or more.      DATE Type of Cancellation Telephone Contact?   05/11/2021 No show for evaluation Yes left a message on significant other's phone.  Reminded of next appointment scheduled at the [facility].   05/22/2021 No show for therapy appointment, s/o called 10 minutes past start of session to cx No, s/o called late to cancel appointment   05/29/2021 No show for therapy appointment Yes, spoke with family member who stated she had a printed scheduled with a different time for the same date. SLP confirmed next SLP appointment date/time and asked family member to get a new printout and to make sure the pt's mychart account is set up.   06/08/2021 NO show Yes, left a message.       [name] MA CCC-SLP [phone]  Phone number [phone]  Email: [email or web address]

## 2021-06-12 ENCOUNTER — Encounter

## 2021-06-12 ENCOUNTER — Encounter: Admit: 2021-06-12 | Discharge: 2021-06-12 | Payer: Medicare (Managed Care)

## 2021-06-12 DIAGNOSIS — I69320 Aphasia following cerebral infarction: Secondary | ICD-10-CM

## 2021-06-12 NOTE — Unmapped (Signed)
OUTPATIENT SPEECH LANGUAGE PATHOLOGY  DAILY NOTE  Name:Danny Earline MayotteO Edgecombe DOB:Nov 08, 1955 ZOX:[id number]  Today's Date: 06/12/2021  Referring Provider:Danielle Colleen CanMarie Shoreman, MD  [address]  [place],  [place] [id number]  Primary Diagnosis:s/p  CVA  Onset Date: 2/22 and 03/28/2021   Diagnosis:aphasia  Insurance Plan:Payor: Investment banker, operationalUNITED HEALTHCARE MANAGED MEDICARE / Plan: UHC MEDICARE HMO COMPLETE / Product Type: Medicare Mngd Care /   # visits per insurance authorization:   04/30/2021/TDC [id number]/[id number]/BENEFIT AUTH INFO  PT,OT,SP- PLAN UNITED [employer] DUAL COMP HMO  POL EFF DT: 02/28/2021  PT OT ST GETS UNLIMITED V BOMN PCY   MODALITY LIMIT? 4  OOP $8300   MET $63.37  CO-INS:  80%  NO YRLY/LTM/PRE-EXISTING   DRAKE IS IN NETWORK  PER: UHC  PHONE# [phone]  Date of initial eval: 05/15/2021  # visits per POC: 2x/week x 10 weeks   Date of initial POC Certification: 05/15/2021  Verified Signed POC Certification:  Yes: by Dr Janan RidgePajor on 05/15/2021     Date of POC Recertification: n/a  Verified Signed POC Recertification:  Yes: n/a No: n/a Date: n/a  Date of Progress Report (every 10 visits):     POC Recertification due(Medicaid 60 days/Medicare 90 days): 08/14/2021      Goals:  Goal 05/25/2021 06/01/2021 06/12/2021       Pt will answer simple yes/no questions presented auditorily with use of strategies and max cues to 70% Personal y/n questions with use of written cues and repetition to 60% Personal y/n questions with repetition and written cues to 70% Id pictures (1 of 3) to 61%     Pt will repeat functional words with max cues to 60% 20% with no awareness of errors ~20% ~20% with max cues, perseverative errors     Pt will match picture (f=2)  to word (f=1) to 70% Word (f=2) to picture (f=1) to 90% 100%  Word f=2 to picture f=1 to 80% Reading at word level to 65%     Pt will copy functional words to 70% acc. 40%, traced names to 70% Copying names to 30% (increased perseveration today, hand over hand to trace Tracing names to 70%     Pt  will use a simple communication system in order to communicate simple basic needs to 70% with max cues. n/a Using SGD I-110 to select appropriate I cons with max cues and repetition to 65% n/a     Patient/caregivers will demonstrate understanding of role of speech therapy diagnosis, prognosis, home exercise programs, plan of care, recommendations, and safety precautions. ongoing ongoing ongoing               ______________________________________________________________________  Visit #: 3   Charges: [id number]  Total Minutes: 45  Pt seen for language therapy with wife, Toma CopierBethany staying in the waiting area.      Education Addressed During This Session: Tactus therapy apps    Homework:  Practice Language Lite Tactus therapy app.tracing names (JusticeBethany, [place], and [place]) Practice reading at word level., worksheets provided    Plan:  Provide handout to wife re aphasia and ways to best communicate.    This note serves as a discharge summary if the patient does not return to Speech Therapy per the above POC.    Jola BaptistSherry E. Martyn Timme MA CCC-SLP [phone]  Phone number [phone]  Email: [email or web address]

## 2021-06-14 ENCOUNTER — Encounter: Admit: 2021-06-14 | Discharge: 2021-06-14 | Payer: Medicare (Managed Care)

## 2021-06-14 DIAGNOSIS — I69320 Aphasia following cerebral infarction: Secondary | ICD-10-CM

## 2021-06-14 NOTE — Unmapped (Signed)
OUTPATIENT SPEECH LANGUAGE PATHOLOGY  DAILY NOTE  Name:[name] DOB:12-Sep-1955 ZOX:[id number]  Today's Date: 06/14/2021  Referring Provider:Danielle Colleen Can, MD  [address]  [place],  [place] [address]  Primary Diagnosis:s/p  CVA  Onset Date: 2/22 and 03/28/2021   Diagnosis:aphasia  Insurance Plan:Payor: Investment banker, operational HEALTHCARE MANAGED MEDICARE / Plan: UHC MEDICARE HMO COMPLETE / Product Type: Medicare Mngd Care /   # visits per insurance authorization:   04/30/2021/TDC [id number]/[id number]/BENEFIT AUTH INFO  PT,OT,SP- PLAN [employer] DUAL COMP HMO  POL EFF DT: 02/28/2021  PT OT ST GETS UNLIMITED V BOMN PCY   MODALITY LIMIT? 4  OOP $8300   MET $63.37  CO-INS:  80%  NO YRLY/LTM/PRE-EXISTING   DRAKE IS IN NETWORK  PER: [employer]HC  PHONE# [phone]  Date of initial eval: 05/15/2021  # visits per POC: 2x/week x 10 weeks   Date of initial POC Certification: 05/15/2021  Verified Signed POC Certification:  Yes: by Dr Janan Ridge on 05/15/2021     Date of POC Recertification: n/a  Verified Signed POC Recertification:  Yes: n/a No: n/a Date: n/a  Date of Progress Report (every 10 visits):     POC Recertification due(Medicaid 60 days/Medicare 90 days): 08/14/2021      Goals:  Goal 05/25/2021 06/01/2021 06/12/2021   06/14/2021    Pt will answer simple yes/no questions presented auditorily with use of strategies and max cues to 70% Personal y/n questions with use of written cues and repetition to 60% Personal y/n questions with repetition and written cues to 70% Id pictures (1 of 3) to 61% With written cues to 65%    Pt will repeat functional words with max cues to 60% 20% with no awareness of errors ~20% ~20% with max cues, perseverative errors ~20%    Pt will match picture (f=2)  to word (f=1) to 70% Word (f=2) to picture (f=1) to 90% 100%  Word f=2 to picture f=1 to 80% Reading at word level to 65% Id 1 of 3 words given a description to 60%    Pt will copy functional words to 70% acc. 40%, traced names to 70% Copying names to 30%  (increased perseveration today, hand over hand to trace Tracing names to 70% Tracing sports teams to 78%    Pt will use a simple communication system in order to communicate simple basic needs to 70% with max cues. n/a Using SGD I-110 to select appropriate I cons with max cues and repetition to 65% n/a Attempted drawing program.  Pt unable    Patient/caregivers will demonstrate understanding of role of speech therapy diagnosis, prognosis, home exercise programs, plan of care, recommendations, and safety precautions. ongoing ongoing ongoing ongoing              ______________________________________________________________________  Visit #: 4   Charges: [id number]  Total Minutes: 34 (pt arrive late)  Pt seen for language therapy with wife, [name] attended session.    Education Addressed During This Session: provide written handout about how to help [name] with communication to wife    Homework:  Practice tactus therapy everyday for 45 minutes, wife tor review education material therapy app.tracing names [name], [name], and [name]) Practice reading at word level., worksheets provided    Plan: use tactus therapy next session and calibrate app for most appropriate setting    This note serves as a discharge summary if the patient does not return to Speech Therapy per the above POC.    [name] MA CCC-SLP #  [id number]  Phone number [phone]  Email: [email or web address]

## 2021-06-15 ENCOUNTER — Encounter

## 2021-06-18 ENCOUNTER — Encounter

## 2021-06-19 ENCOUNTER — Encounter

## 2021-06-19 NOTE — Unmapped (Signed)
OUTPATIENT THERAPY NO SHOW/ CANCEL NOTE    The Outpatient Therapy Attendance Policy which states 2 consecutive no-shows or 3 missed sessions within the last 30 days may result in discharge from therapy. Cancellation less than 24 hours before the appointment time is considered a no-show. Additionally, the policy states therapy may be unable to be provided if patient arrives late by 15 minutes or more.      DATE Type of Cancellation Telephone Contact?   05/11/2021 No show for evaluation Yes left a message on significant other's phone.  Reminded of next appointment scheduled at the [facility].   05/22/2021 No show for therapy appointment, s/o called 10 minutes past start of session to cx No, s/o called late to cancel appointment   05/29/2021 No show for therapy appointment Yes, spoke with family member who stated she had a printed scheduled with a different time for the same date. SLP confirmed next SLP appointment date/time and asked family member to get a new printout and to make sure the pt's mychart account is set up.   06/08/2021 NO show Yes, left a message.   06/18/2021 No show no       [name] MA CCC-SLP [phone]  Phone number [phone]  Email: [email or web address]

## 2021-06-20 NOTE — Unmapped (Signed)
Patient was a no call / no show / late cancel for today's scheduled Occupational Therapy appointment.      The Outpatient Therapy Attendance Policy which states 2 consecutive no-shows or 3 missed sessions within the last 30 days may result in discharge from therapy. Cancellation less than 24 hours before the appointment time is considered a no-show. Additionally, the policy states therapy may be unable to be provided if patient arrives late by 15 minutes or more.     Date Type of Cancellation   06/20/21 No show for evaluation                  Therapist signature:  Audry Riles, OTR/L

## 2021-06-22 NOTE — Unmapped (Signed)
OUTPATIENT THERAPY NO SHOW/ CANCEL NOTE/ and DISCHARGE    The Outpatient Therapy Attendance Policy which states 2 consecutive no-shows or 3 missed sessions within the last 30 days may result in discharge from therapy. Cancellation less than 24 hours before the appointment time is considered a no-show. Additionally, the policy states therapy may be unable to be provided if patient arrives late by 15 minutes or more.      DATE Type of Cancellation Telephone Contact?   05/11/2021 No show for evaluation Yes left a message on significant other's phone.  [facility].   05/22/2021 No show for therapy appointment, s/o called 10 minutes past start of session to cx No, s/o called late to cancel appointment   05/29/2021 No show for therapy appointment Yes, spoke with family member who stated she had a printed scheduled with a different time for the same date. SLP confirmed next SLP appointment date/time and asked family member to get a new printout and to make sure the pt's mychart account is set up.   06/08/2021 NO show Yes, left a message.   06/18/2021 No show no   06/22/2021 No show Yes, left a message       Pt d/c this date from speech therapy due to attendance policy.  Pt has made gains in therapy.  See last therapy note for d/c status.    Jola BaptistSherry E. Sheily Lineman MA CCC-SLP [phone]  Phone number [phone]  Email: [email or web address]

## 2021-06-27 ENCOUNTER — Ambulatory Visit
Admission: EM | Admit: 2021-06-27 | Discharge: 2021-06-27 | Disposition: A | Discharge status: Home / Self Care | Payer: No Typology Code available for payment source | Attending: Nurse Practitioner | Admitting: Nurse Practitioner

## 2021-06-27 DIAGNOSIS — H6983 Other specified disorders of Eustachian tube, bilateral: Secondary | ICD-10-CM | POA: Diagnosis not present

## 2021-06-27 MED ORDER — CETIRIZINE HCL 10 MG PO TABS: 10.0000 mg | ORAL_TABLET | Freq: Every day | ORAL | 0 refills | Status: AC

## 2021-06-27 MED ORDER — FLUTICASONE PROPIONATE 50 MCG/ACT NA SUSP: 2.0000 | Freq: Every day | NASAL | 0 refills | Status: DC

## 2021-06-27 MED ORDER — PREDNISONE 20 MG PO TABS: 40.0000 mg | ORAL_TABLET | Freq: Every day | ORAL | 0 refills | Status: AC

## 2021-06-27 NOTE — Discharge Instructions (Addendum)
Take medication as prescribed. May apply warm compresses to the affected ear to help with pain or discomfort. Do not stick or insert anything in the ear while symptoms persist. May continue to take over-the-counter Tylenol as needed to help with ear pain. If your symptoms worsen or fail to improve, please follow-up with your primary care physician.

## 2021-06-27 NOTE — ED Triage Notes (Signed)
Pt reports stopped up left ear x 1 week. Reports home remedies gives no relief.

## 2021-06-27 NOTE — ED Provider Notes (Signed)
[facility]    CSN: [id number] Arrival date & time: 06/27/21  1538      History   Chief Complaint Chief Complaint  Patient presents with   Ear Fullness    HPI Daniel [name] is a 66 y.o. male.   Patient presents for complaints of left ear have been present for the past week.  He also states that he has fullness in the right ear, but it is not as bad as the left side.  He denies fever, chills, nasal congestion, runny nose, cough, or headache.  He does admit to intermittent dizziness and lightheadedness.  He has been trying home remedies with minimal relief.  The history is provided by the patient.   Past Medical History:  Diagnosis Date   COPD (chronic obstructive pulmonary disease) ([place])    Hypertension     Patient Active Problem List   Diagnosis Date Noted   Hematochezia 04/02/2017   Heme + stool 04/02/2017   Rotator cuff tear 04/05/2013   Shoulder dislocation 04/05/2013   Acute respiratory failure ([place]) 12/22/2012   Essential hypertension, benign 12/22/2012   COPD exacerbation ([place]) 12/21/2012   Hypertriglyceridemia without hypercholesterolemia 03/12/2011   Chest pain 03/06/2011   Anxiety 03/06/2011   Obesity (BMI 30-39.9) 03/06/2011   Pre-diabetes 03/06/2011    Past Surgical History:  Procedure Laterality Date   APPENDECTOMY     COLONOSCOPY N/A 04/24/2017   Procedure: COLONOSCOPY;  Surgeon: Danie Binder, MD;  Location: [facility];  Service: Endoscopy;  Laterality: N/A;  1:00pm   HEMORRHOID SURGERY     HERNIA REPAIR     RLQ, umbilical       Home Medications    Prior to Admission medications   Medication Sig Start Date End Date Taking? Authorizing Provider  cetirizine (ZYRTEC) 10 MG tablet Take 1 tablet (10 mg total) by mouth daily. 06/27/21  Yes Tyde Lamison-Warren, Alda Lea, NP  fluticasone (FLONASE) 50 MCG/ACT nasal spray Place 2 sprays into both nostrils daily. 06/27/21  Yes Prince Olivier-Warren, Alda Lea, NP  predniSONE (DELTASONE) 20 MG tablet  Take 2 tablets (40 mg total) by mouth daily with breakfast for 5 days. 06/27/21 07/02/21 Yes Benedicta Sultan-Warren, Alda Lea, NP  acetaminophen (TYLENOL) 500 MG tablet Take 1,000 mg by mouth daily as needed for moderate pain or headache.    [provider]  albuterol (PROVENTIL HFA;VENTOLIN HFA) 108 (90 BASE) MCG/ACT inhaler Inhale 2 puffs into the lungs every 4 (four) hours as needed for wheezing or shortness of breath. 12/23/12   Samuella Cota, MD  albuterol (PROVENTIL) (2.5 MG/3ML) 0.083% nebulizer solution Take 2.5 mg by nebulization every 6 (six) hours as needed for wheezing or shortness of breath.    [provider]  albuterol (PROVENTIL) 4 MG tablet Take 4 mg by mouth 4 (four) times daily.    [provider]  amLODipine (NORVASC) 5 MG tablet Take 5 mg by mouth daily.    [provider]  enalapril (VASOTEC) 5 MG tablet Take 10 mg by mouth 2 (two) times daily.     [provider]  furosemide (LASIX) 20 MG tablet Take 20 mg by mouth daily as needed for edema.  03/06/13   [provider]  Glucosamine-Chondroitin (OSTEO BI-FLEX REGULAR STRENGTH PO) Take 1 tablet by mouth daily.    [provider]  ibuprofen (ADVIL,MOTRIN) 200 MG tablet Take 400 mg by mouth daily as needed for headache or moderate pain.    [provider]  ipratropium (ATROVENT) 0.03 %  nasal spray Place 2 sprays into both nostrils 2 (two) times daily. 06/18/20   Scot Jun, FNP  levocetirizine (XYZAL) 5 MG tablet Take 1 tablet (5 mg total) by mouth every evening. 06/18/20   Scot Jun, FNP  Multiple Vitamin (MULITIVITAMIN WITH MINERALS) TABS Take 1 tablet by mouth daily.    [provider]  promethazine-dextromethorphan (PROMETHAZINE-DM) 6.25-15 MG/5ML syrup Take 5 mLs by mouth 3 (three) times daily as needed for cough. 06/18/20   Scot Jun, FNP  Tamsulosin HCl (FLOMAX) 0.4 MG CAPS Take 0.4 mg by mouth at bedtime.     [provider]    Family History Family History  Problem Relation Age of Onset   Seizures Mother    Hypertension Mother    COPD Father    Colon cancer Neg Hx    Gastric cancer Neg Hx    Esophageal cancer Neg Hx     Social History Social History   Tobacco Use   Smoking status: Former    Packs/day: 0.30    Years: 40.00    Pack years: 12.00    Types: Cigarettes    Quit date: 01/28/2013    Years since quitting: 8.4   Smokeless tobacco: Never  Substance Use Topics   Alcohol use: No   Drug use: No     Allergies   Patient has no known allergies.   Review of Systems Review of Systems PER HPI  Physical Exam Triage Vital Signs ED Triage Vitals  Enc Vitals Group     BP 06/27/21 1613 131/81     Pulse Rate 06/27/21 1613 69     Resp 06/27/21 1613 20     Temp 06/27/21 1613 98.7 F (37.1 C)     Temp Source 06/27/21 1613 Oral     SpO2 06/27/21 1613 94 %     Weight --      Height --      Head Circumference --      Peak Flow --      Pain Score 06/27/21 1612 0     Pain Loc --      Pain Edu? --      Excl. in [place]? --    No data found.  Updated Vital Signs BP 131/81 (BP Location: Right Arm)   Pulse 69   Temp 98.7 F (37.1 C) (Oral)   Resp 20   SpO2 94%   Visual Acuity Right Eye Distance:   Left Eye Distance:   Bilateral Distance:    Right Eye Near:   Left Eye Near:    Bilateral Near:     Physical Exam Vitals and nursing note reviewed.  Constitutional:      General: He is not in acute distress.    Appearance: Normal appearance.  HENT:     Head: Normocephalic.     Right Ear: Ear canal and external ear normal. A middle ear effusion is present.     Left Ear: Ear canal and external ear normal. A middle ear effusion is present.     Nose: Nose normal.     Mouth/Throat:     Mouth: Mucous membranes are moist.  Eyes:     Extraocular Movements: Extraocular movements intact.     Conjunctiva/sclera: Conjunctivae normal.     Pupils: Pupils are equal, round, and reactive to  light.  Cardiovascular:     Rate and Rhythm: Normal rate and regular rhythm.  Pulmonary:     Effort: Pulmonary effort is normal.  Breath sounds: Normal breath sounds.  Abdominal:     General: Bowel sounds are normal.     Palpations: Abdomen is soft.  Skin:    General: Skin is warm and dry.     Capillary Refill: Capillary refill takes less than 2 seconds.  Neurological:     General: No focal deficit present.     Mental Status: He is alert and oriented to person, place, and time.  Psychiatric:        Mood and Affect: Mood normal.        Behavior: Behavior normal.     UC Treatments / Results  Labs (all labs ordered are listed, but only abnormal results are displayed) Labs Reviewed - No data to display  EKG   Radiology No results found.  Procedures Procedures (including critical care time)  Medications Ordered in UC Medications - No data to display  Initial Impression / Assessment and Plan / UC Course  I have reviewed the triage vital signs and the nursing notes.  Pertinent labs & imaging results that were available during my care of the patient were reviewed by me and considered in my medical decision making (see chart for details).  Patient presents with complaints of bilateral ear fullness.  Symptoms are worse on the left side, and have been present for 1 week.  On exam, he has bilateral middle ear effusions.  Symptoms are consistent with eustachian tube dysfunction.  No bulging or erythema of the TMs, no cerumen impaction.  We will start patient on prednisone, Zyrtec, and fluticasone for his symptoms.  Supportive care was also recommended.  Patient advised to follow-up with his primary care if symptoms do not improve. Final Clinical Impressions(s) / UC Diagnoses   Final diagnoses:  Eustachian tube dysfunction, bilateral     Discharge Instructions      Take medication as prescribed. May apply warm compresses to the affected ear to help with pain or  discomfort. Do not stick or insert anything in the ear while symptoms persist. May continue to take over-the-counter Tylenol as needed to help with ear pain. If your symptoms worsen or fail to improve, please follow-up with your primary care physician.     ED Prescriptions     Medication Sig Dispense Auth. Provider   predniSONE (DELTASONE) 20 MG tablet Take 2 tablets (40 mg total) by mouth daily with breakfast for 5 days. 10 tablet Beyonka Pitney-Warren, Alda Lea, NP   cetirizine (ZYRTEC) 10 MG tablet Take 1 tablet (10 mg total) by mouth daily. 30 tablet Jessyka Austria-Warren, Alda Lea, NP   fluticasone (FLONASE) 50 MCG/ACT nasal spray Place 2 sprays into both nostrils daily. 16 g Raejean Swinford-Warren, Alda Lea, NP      PDMP not reviewed this encounter.   Tish Men, NP 06/27/21 1640

## 2021-07-06 NOTE — Unmapped (Signed)
Stroke Follow Up Phone Call:    Stroke  Discharged from [facility] to [facility] 04/06/21  PCP routed inpatient discharge summary at time of discharge    MRS 4 at time of discharge    Follow up reminder for Dr. Abelina Bachelor sent to home address of record     Future Appointments   Date Time Provider Department Center   07/23/2021 11:00 AM Burnett Harry, MD [facility] NEUR [facility]   11/02/2021  9:00 AM Ernestine Mcmurray, MD [facility] PULM [facility]

## 2021-07-23 ENCOUNTER — Inpatient Hospital Stay: Admit: 2021-07-23

## 2021-07-23 ENCOUNTER — Encounter: Attending: Neurology

## 2021-07-23 NOTE — Unmapped (Signed)
Patient never returned call and No Sowed Appointment Call Complete

## 2021-07-23 NOTE — Unmapped (Signed)
Called patient and left detailed message to return call to Pre-Chart for  Future Appointment 07-23-2021

## 2021-07-24 NOTE — Unmapped (Signed)
Mailed Missed Appointment No Show letter to the address on file, for the department of Neurology.

## 2021-10-17 ENCOUNTER — Emergency Department: Admit: 2021-10-17 | Payer: Medicare (Managed Care)

## 2021-10-17 ENCOUNTER — Inpatient Hospital Stay
Admission: EM | Admit: 2021-10-17 | Discharge: 2021-10-19 | Disposition: A | Discharge status: Home / Self Care | Payer: Medicare (Managed Care)

## 2021-10-17 DIAGNOSIS — J189 Pneumonia, unspecified organism: Secondary | ICD-10-CM

## 2021-10-17 DIAGNOSIS — R4701 Aphasia: Secondary | ICD-10-CM

## 2021-10-17 LAB — URINALYSIS-MACROSCOPIC W/REFLEX TO MICROSCOPIC
Bilirubin, UA: NEGATIVE
Glucose, UA: NEGATIVE mg/dL
Ketones, UA: NEGATIVE mg/dL
Leukocytes, UA: NEGATIVE
Nitrite, UA: NEGATIVE
Protein, UA: NEGATIVE mg/dL
Specific Gravity, UA: 1.025 (ref 1.005–1.035)
Urobilinogen, UA: 0.2 EU/dL (ref 0.2–1.0)
pH, UA: 6.5 (ref 5.0–8.0)

## 2021-10-17 LAB — HEPATIC FUNCTION PANEL
ALT: 5 U/L (ref 7–52)
AST: 11 U/L (ref 13–39)
Albumin: 4.5 g/dL (ref 3.5–5.7)
Alkaline Phosphatase: 87 U/L (ref 36–125)
Bilirubin, Direct: 0.1 mg/dL (ref 0.0–0.4)
Bilirubin, Indirect: 0.6 mg/dL (ref 0.0–1.1)
Total Bilirubin: 0.7 mg/dL (ref 0.0–1.5)
Total Protein: 7.4 g/dL (ref 6.4–8.9)

## 2021-10-17 LAB — INFLUENZA A AND B, COVID, RSV COMBINATION ASSAY, NAA
Influenza A: NEGATIVE
Influenza B: NEGATIVE
RSV: NEGATIVE
SARS-CoV-2: NEGATIVE

## 2021-10-17 LAB — DIFFERENTIAL
Basophils Absolute: 80 /uL (ref 0–200)
Basophils Relative: 1 % (ref 0.0–1.0)
Eosinophils Absolute: 112 /uL (ref 15–500)
Eosinophils Relative: 1.4 % (ref 0.0–8.0)
Lymphocytes Absolute: 2864 /uL (ref 850–3900)
Lymphocytes Relative: 35.8 % (ref 15.0–45.0)
Monocytes Absolute: 776 /uL (ref 200–950)
Monocytes Relative: 9.7 % (ref 0.0–12.0)
Neutrophils Absolute: 4168 /uL (ref 1500–7800)
Neutrophils Relative: 52.1 % (ref 40.0–80.0)
nRBC: 0 /100{WBCs} (ref 0–0)

## 2021-10-17 LAB — CBC
Hematocrit: 36.4 % — ABNORMAL LOW (ref 38.5–50.0)
Hemoglobin: 12 g/dL — ABNORMAL LOW (ref 13.2–17.1)
MCH: 25 pg — ABNORMAL LOW (ref 27.0–33.0)
MCHC: 33 g/dL (ref 32.0–36.0)
MCV: 75.6 fL — ABNORMAL LOW (ref 80.0–100.0)
MPV: 8.9 fL (ref 7.5–11.5)
Platelets: 222 10E3/uL (ref 140–400)
RBC: 4.82 10E6/uL (ref 4.20–5.80)
RDW: 23.3 % — ABNORMAL HIGH (ref 11.0–15.0)
WBC: 8 10E3/uL (ref 3.8–10.8)

## 2021-10-17 LAB — BASIC METABOLIC PANEL
Anion Gap: 8 mmol/L (ref 3–16)
BUN: 23 mg/dL (ref 7–25)
CO2: 24 mmol/L (ref 21–33)
Calcium: 8.9 mg/dL (ref 8.6–10.3)
Chloride: 103 mmol/L (ref 98–110)
Creatinine: 1.66 mg/dL — ABNORMAL HIGH (ref 0.60–1.30)
EGFR: 45
Glucose: 99 mg/dL (ref 70–100)
Osmolality, Calculated: 284 mosm/kg (ref 278–305)
Potassium: 3.6 mmol/L (ref 3.5–5.3)
Sodium: 135 mmol/L (ref 133–146)

## 2021-10-17 LAB — PROTIME-INR
INR: 1 (ref 0.9–1.1)
Protime: 13.3 s (ref 12.1–15.1)

## 2021-10-17 LAB — HIGH SENSITIVITY TROPONIN: High Sensitivity Troponin: 10 ng/L (ref 0–20)

## 2021-10-17 LAB — POC GLU MONITORING DEVICE: POC Glucose Monitoring Device: 100 mg/dL (ref 70–100)

## 2021-10-17 LAB — URINALYSIS, MICROSCOPIC
RBC, UA: 6 /HPF — ABNORMAL HIGH (ref 0–3)
Squam Epithel, UA: 1 /HPF (ref 0–5)
WBC, UA: 5 /HPF (ref 0–5)

## 2021-10-17 LAB — ANTI-XA LMW HEPARIN: Anti-Xa LMW Heparin: <0.1 U/mL — ABNORMAL LOW (ref 0.50–1.10)

## 2021-10-17 LAB — APTT: aPTT: 28.7 s (ref 25.5–35.0)

## 2021-10-17 LAB — BLOOD BANK SAMPLE - NO ORDERS

## 2021-10-17 LAB — MAGNESIUM: Magnesium: 2 mg/dL (ref 1.5–2.5)

## 2021-10-17 MED ORDER — midazolam (PF) (VERSED) injection 2 mg
5 | Freq: Once | INTRAMUSCULAR | PRN
Start: 2021-10-17 — End: 2021-10-17

## 2021-10-17 MED ORDER — oxybutynin (DITROPAN) tablet 5 mg
5 | Freq: Two times a day (BID) | ORAL
Start: 2021-10-17 — End: 2021-10-19
  Administered 2021-10-18 – 2021-10-19 (×4): 5 mg via ORAL

## 2021-10-17 MED ORDER — metoprolol succinate (TOPROL-XL) 24 hr tablet 100 mg
100 | Freq: Every day | ORAL
Start: 2021-10-17 — End: 2021-10-19
  Administered 2021-10-18 – 2021-10-19 (×2): 100 mg via ORAL

## 2021-10-17 MED ORDER — aspirin suppository 300 mg
300 | Freq: Every day | RECTAL
Start: 2021-10-17 — End: 2021-10-17

## 2021-10-17 MED ORDER — ARIPiprazole (ABILIFY) tablet 5 mg
5 | Freq: Every day | ORAL
Start: 2021-10-17 — End: 2021-10-19
  Administered 2021-10-18 – 2021-10-19 (×2): 5 mg via ORAL

## 2021-10-17 MED ORDER — clopidogreL (PLAVIX) tablet 75 mg
75 | Freq: Every day | ORAL
Start: 2021-10-17 — End: 2021-10-17

## 2021-10-17 MED ORDER — aspirin tablet 325 mg
325 | Freq: Every day | ORAL
Start: 2021-10-17 — End: 2021-10-17
  Administered 2021-10-17: 15:00:00 325 mg via ORAL

## 2021-10-17 MED ORDER — nicotine (polacrilex) (NICORETTE) gum 2 mg
2 | BUCCAL | PRN
Start: 2021-10-17 — End: 2021-10-19

## 2021-10-17 MED ORDER — cloNIDine HCL (CATAPRES) tablet 0.2 mg
0.1 | Freq: Two times a day (BID) | ORAL
Start: 2021-10-17 — End: 2021-10-19
  Administered 2021-10-18 – 2021-10-19 (×4): 0.2 mg via ORAL

## 2021-10-17 MED ORDER — midazolam (PF) (VERSED) injection 2 mg
5 | Freq: Once | INTRAMUSCULAR | Status: AC
Start: 2021-10-17 — End: 2021-10-17
  Administered 2021-10-17: 10:00:00 2 mg via INTRAVENOUS

## 2021-10-17 MED ORDER — enoxaparin (LOVENOX) syringe 40 mg/0.4 mL
40 | Freq: Every day | SUBCUTANEOUS
Start: 2021-10-17 — End: 2021-10-19
  Administered 2021-10-17: 15:00:00 40 mg via SUBCUTANEOUS

## 2021-10-17 MED ORDER — cefTRIAXone (ROCEPHIN) 2 g in sodium chloride 0.9% 20 mL IV Push
2 | INTRAMUSCULAR
Start: 2021-10-17 — End: 2021-10-18
  Administered 2021-10-17: 20:00:00 2 g via INTRAVENOUS

## 2021-10-17 MED ORDER — midazolam (PF) (VERSED) 1 mg/mL injection
1 | INTRAMUSCULAR | Status: AC
Start: 2021-10-17 — End: ?

## 2021-10-17 MED ORDER — nicotine (polacrilex) (NICORETTE) gum 4 mg
2 | BUCCAL | PRN
Start: 2021-10-17 — End: 2021-10-17

## 2021-10-17 MED ORDER — acetaminophen (TYLENOL) tablet 650 mg
325 | Freq: Four times a day (QID) | ORAL | PRN
Start: 2021-10-17 — End: 2021-10-19

## 2021-10-17 MED ORDER — nicotine (NICODERM CQ) 21 mg/24 hr 1 patch
21 | Freq: Every day | TRANSDERMAL
Start: 2021-10-17 — End: 2021-10-17

## 2021-10-17 MED ORDER — aspirin EC tablet 81 mg
81 | Freq: Every day | ORAL
Start: 2021-10-17 — End: 2021-10-19
  Administered 2021-10-18 – 2021-10-19 (×2): 81 mg via ORAL

## 2021-10-17 MED ORDER — pantoprazole (PROTONIX) EC tablet 40 mg
40 | Freq: Every day | ORAL
Start: 2021-10-17 — End: 2021-10-19
  Administered 2021-10-18 – 2021-10-19 (×2): 40 mg via ORAL

## 2021-10-17 MED ORDER — polyethylene glycol (MIRALAX) packet 17 g
17 | Freq: Every day | ORAL
Start: 2021-10-17 — End: 2021-10-19
  Administered 2021-10-18 – 2021-10-19 (×2): 17 g via ORAL

## 2021-10-17 MED ORDER — nicotine (NICODERM CQ) 14 mg/24 hr 1 patch
14 | Freq: Every day | TRANSDERMAL
Start: 2021-10-17 — End: 2021-10-19
  Administered 2021-10-17 – 2021-10-19 (×3): 1 via TRANSDERMAL

## 2021-10-17 MED ORDER — OMNIPAQUE (iohexol) 350 mg iodine/mL 80 mL
350 | Freq: Once | INTRAVENOUS | Status: AC | PRN
Start: 2021-10-17 — End: 2021-10-17
  Administered 2021-10-17: 10:00:00 80 mL via INTRAVENOUS

## 2021-10-17 MED ORDER — clopidogreL (PLAVIX) tablet 75 mg
75 | Freq: Every day | ORAL
Start: 2021-10-17 — End: 2021-10-19
  Administered 2021-10-18 – 2021-10-19 (×3): 75 mg via ORAL

## 2021-10-17 MED ORDER — atorvastatin (LIPITOR) tablet 80 mg
80 | Freq: Every evening | ORAL
Start: 2021-10-17 — End: 2021-10-19
  Administered 2021-10-18 – 2021-10-19 (×2): 80 mg via ORAL

## 2021-10-17 MED ORDER — tamsulosin (FLOMAX) capsule 0.4 mg
0.4 | Freq: Every evening | ORAL
Start: 2021-10-17 — End: 2021-10-19
  Administered 2021-10-18 – 2021-10-19 (×2): 0.4 mg via ORAL

## 2021-10-17 MED FILL — ASPIRIN 300 MG RECTAL SUPPOSITORY: 300 300 MG | RECTAL | Qty: 1

## 2021-10-17 MED FILL — MIDAZOLAM (PF) 1 MG/ML INJECTION SOLUTION: 1 1 mg/mL | INTRAMUSCULAR | Qty: 2

## 2021-10-17 MED FILL — ENOXAPARIN 40 MG/0.4 ML SUBCUTANEOUS SYRINGE: 40 40 mg/0.4 mL | SUBCUTANEOUS | Qty: 0.4

## 2021-10-17 MED FILL — NICOTINE 14 MG/24 HR DAILY TRANSDERMAL PATCH: 14 14 mg/24 hr | TRANSDERMAL | Qty: 1

## 2021-10-17 MED FILL — OMNIPAQUE 350 MG IODINE/ML INTRAVENOUS SOLUTION: 350 350 mg iodine/mL | INTRAVENOUS | Qty: 80

## 2021-10-17 MED FILL — CEFTRIAXONE 2 GRAM SOLUTION FOR INJECTION: 2 2 gram | INTRAMUSCULAR | Qty: 1

## 2021-10-17 MED FILL — ASPIRIN 325 MG TABLET: 325 325 MG | ORAL | Qty: 1

## 2021-10-17 NOTE — Unmapped (Signed)
Bed: C30U  Expected date:   Expected time:   Means of arrival:   Comments:  SRU

## 2021-10-17 NOTE — Unmapped (Signed)
Neuro MD at BS.

## 2021-10-17 NOTE — Unmapped (Addendum)
TELESTROKE NOTE:    The patient presented with worsening aphasia, LKW 0000, presented around 0530. 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 Wakeup MRI completed, showing punctate infarcts in left occipital lobe and left lacunar infarct. FLAIR sequence with motion artifact and location of infarcts in prior hyperintense gliosis tissue, thus difficult to tell if true mismatch present.    We recommended against IV thrombolysis, given unclear DWI/FLAIR mismatch and risk outweighing benefits.

## 2021-10-17 NOTE — Unmapped (Signed)
[place] ED  Reassessment Note    [name] is a 66 y.o. male who presented to the emergency department on 10/17/2021. This patient was initially seen by an off-going provider and their care has been turned over to me. Please see the original provider's note for details regarding the initial history, physical exam and ED course.  At the time of turnover the following steps in the patient's evaluation were pending: Follow-up troponin urinalysis COVID flu testing.  Chest x-ray and formal MRI read.  CTA head and neck results.  Discussion with neurology for admission.    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  He has baseline receptive expressive aphasia.  Approximately 3 AM he was discovered to have more severe aphasia than baseline.  Brought in.  CT demonstrated no hemorrhage.  Wake-up MRI demonstratedPunctate foci of acute ischemia in the left occipital subcortical region and left corona radiata. No mass effect.  These findings are not consistent with current neurologic symptoms.  CT angiogram demonstrated   1.  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   2.  Mild left supraclinoid internal carotid artery stenosis, unchanged since prior.   3.  No large vessel occlusion.     Urinalysis remains pending        Clinical Impression:    1.  Acute ischemic stroke.  Plan is admission to neurology.  Floor status

## 2021-10-17 NOTE — Unmapped (Signed)
MD Pleuhs updated on patient status and refusal of meds. Per MD, patient possibly willing to take meds when he gets upstairs and agreeable to transport. Awaiting transport upstairs at this time.

## 2021-10-17 NOTE — Unmapped (Signed)
[place] ED Note    Date of Service: 10/17/2021    Reason for Visit: Aphasia      Patient History     HPI: [name] is a 66 y.o. 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     - Last Known Normal: 0000  - Discovery Time: 0300  - Pre-Hospital FSBG: N/A  - Anticoagulation: No  - Other Thrombolytic Contraindications: None  - Baseline Functional Status: Expressive and receptive aphasia at baseline (in setting     History obtained from the patient's wife as the patient has expressive and receptive aphasia at baseline in the setting of his recent left M2 stroke in March 2023.  She states that he was last normal when he went to sleep at 0000 with baseline expressive and receptive aphasia.  However, upon waking up at 0330 this morning, she reports that his aphasia appeared significantly worse to her and similar to when he presented back in March.  She states that she is able to understand him at times though is unable to understand him at all at this time and he is unable to follow any commands.  She also notes that he appears more agitated and was motioning as if he was having a left-sided headache.  She also notes that he has new onset redness of his left eye.  Unable to obtain additional history from the patient given his severe expressive and receptive aphasia.    Past Medical History:   Diagnosis Date    COPD (chronic obstructive pulmonary disease) (CMS-HCC)     Diabetes mellitus (CMS-HCC)     Dysphagia     GERD (gastroesophageal reflux disease)     Hematuria     Hepatitis     Hypertension      Past Surgical History:   Procedure Laterality Date    ESOPHAGOGASTRODUODENOSCOPY N/A 11/19/2013    Procedure: ESOPHAGOGASTRODUODENOSCOPY WITH MAC;  Surgeon: Charlane Ferretti, MD;  Location: [facility] ENDOSCOPY;   Service: Gastroenterology;  Laterality: N/A;    FOOT SURGERY Left     HERNIA REPAIR      about 5 yrs ago     MOTTY BORIN  reports that he has been smoking cigarettes. He has a 1.25 pack-year smoking history. He has never used smokeless tobacco. He reports that he does not drink alcohol and does not use drugs.    Previous Medications    ARIPIPRAZOLE (ABILIFY) 5 MG TABLET    Take 1 tablet (5 mg total) by mouth daily.    ASPIRIN 81 MG CHEWABLE TABLET    Chew 1 tablet (81 mg total) by mouth daily with breakfast.    ATORVASTATIN (LIPITOR) 80 MG TABLET    Take 1 tablet (80 mg total) by mouth at bedtime.    CLONIDINE (CATAPRES) 0.2 MG/24 HR PATCH    Place 1 patch onto the skin once a week.    NICOTINE (NICODERM CQ) 14 MG/24 HR    Place 1 patch onto the skin daily.    NICOTINE, POLACRILEX, (NICORETTE) 2 MG GUM    Take 1 each (2 mg total) by mouth every hour as needed for Smoking cessation.    TAMSULOSIN (FLOMAX) 0.4 MG CAP    Take 1 capsule (0.4 mg total) by mouth at bedtime. Take 0.4 mg  by mouth daily    VALSARTAN (DIOVAN) 80 MG TABLET    Take 1 tablet (80 mg total) by mouth daily.     Allergies:   Allergies as of 10/17/2021 - Fully Reviewed 10/17/2021   Allergen Reaction Noted    Penicillins Hives 04/22/2013     Review of Systems     ROS: A comprehensive review of systems was performed and negative except as noted previously in the HPI.    Physical Exam     Vitals:    10/17/21 0621   BP: (!) 194/119   Pulse: 86   Resp: 19   Temp: 98.3 F (36.8 C)   TempSrc: Oral   SpO2: 97%     General: 66 y.o. male, chronically ill-appearing; in no apparent distress.  Head: Normocephalic, atraumatic.   Eyes: Anicteric. PERRL, EOMI. L subconjunctival hemorrhage.  ENT: No nasal discharge. Mucous membranes are moist.  Neck: Supple, full ROM, trachea midline.   Pulmonary: Non-labored breathing. Lungs clear to auscultation bilaterally with symmetric aeration. No wheezes/rales/rhonchi.   Cardiac: Regular rate and rhythm. No  murmurs/rubs/gallops.  Abdomen: Soft, non-distended. Non-tender. No rebound or guarding.  Musculoskeletal: No long bone extremity deformity.   Extremities: Warm and well-perfused. No peripheral edema.  Skin: No rashes or bruising.  Neuro: Exam notable only for severe expressive and receptive aphasia. See NIHSS below.  Psych: Mood and affect appropriate for situation.    NIH Stroke Scale:  Date/Time: 10/17/2021 6:06 AM    1a.  Level of Consciousness:  0 = Alert (keenly responsive)   1b. LOC Questions:   2 = Answers neither question correctly   1c. LOC Commands:  0 = Performs both tasks correctly   2.  Best Gaze:  0 = Normal   3. Visual:  0 = No visual loss   4. Facial Palsy:  0 = Normal symmetrical movements   5a. Motor Left Arm:  0 = No drift (limb holds 90 or 45 degrees for full 10 sec)   5b.  Motor Right Arm:  0 = No drift (limb holds 90 or 45 degrees for full 10 sec)   6a. Motor Left Leg:  0 = No drift (leg holds 30-degree position for full 5 sec)   6b  Motor Right Leg:   0 = No drift (leg holds 30-degree position for full 5 sec)   7. Limb Ataxia:  0 = Patient cannot understand or is paralyzed   8.  Sensory:  2 = No response and quadriplegic   9. Best Language:   2 = Severe aphasia (fragmentary expression, inference needed, cannot identify materials)   10. Dysarthria:  0 = Normal   11. Extinction and Inattention:  0 = No abnormality    Total =           6     Diagnostic Studies     Labs:  Please see electronic medical record for any diagnostic tests performed in the ED.    Radiology:  Please see electronic medical record for any diagnostic tests performed in the ED.    ECG:  Indication: Stroke,  Rate: 94,  Rhythm: Normal sinus rhythm,  Ectopy: None,  Intervals and Conduction: Normal,  Axis: Normal,  ST Segment Change: No ST elevations or depressions,  T waves:  Isolated T wave inversion in lead aVL ,  Q waves: No pathologic Q waves,  Comparison to Prior ECG: Grossly unchanged from most recent prior ECG on  03/29/2021,  Interpretation: Nonspecific T  wave abnormality, no acute changes.    Most Recent MRI Head:  MRI Head WO contrast    Result Date: 03/31/2021  IMPRESSION: 1.  Acute infarct involving the left parietal-temporal region. Left M2 occlusion with abnormal flow void is redemonstrated. 2.  Interval decrease in spotty diffusion signal in the left occipital lobe related to evolving subacute infarct. CRITICAL RESULT: Acute infarct involving the left parietal-temporal region.  This finding was discussed with Joya Martyr, MD on  03/31/2021 2:48 AM EST by telephone. They confirmed that they understood the findings communicated to them.  #[id number]# Approved by Ephriam Jenkins, MD on 03/31/2021 2:59 AM EST I have personally reviewed the images and I agree with this report. Report Verified by: Charlean Sanfilippo, MD at 03/31/2021 3:20 AM EST    MRI Head WO contrast    Addendum Date: 03/20/2021    ** ADDENDUM #1** The impression should read: IMPRESSION: 1. Recent Left parieto-occipital infarct. 2. No intracranial mass or hemorrhage. Report Verified by: Burt Ek, MD at 03/20/2021 12:19 PM EST** ORIGINAL REPORT ** EXAM: MRI BRAIN WO CONTRAST INDICATION: Stroke, follow up TECHNIQUE:  MRI brain performed including multiplanar T1 and T2 weighted sequences. COMPARISON: CT 03/18/2021 FINDINGS: Ventricles unchanged, borderline in size. No hematoma. Small microbleed right pons. Area of 4 x 1.5 x 2 cm cortical diffusion restriction left parieto-occipital. Bilateral symmetric foci small and confluent areas of T2/FLAIR hyperintensity cerebral hemisphere white matter bilaterally, thalami, pons, midbrain with multiple  lacunar changes. Prominent perivascular spaces throughout hemisphere white matter. No large artery or major dural sinus flow signal abnormality. Orbits and paranasal sinuses unremarkable. Temporal bones exhibit bilateral mastoid fluid effusion. IMPRESSION: 1.  Normal MRI brain without contrast. 2.  No intracranial mass, signal abnormality,  or hemorrhage. Report Verified by: Burt Ek, MD at 03/20/2021 8:41 AM EST    Result Date: 03/20/2021  IMPRESSION: 1.  Normal MRI brain without contrast. 2.  No intracranial mass, signal abnormality, or hemorrhage. Report Verified by: Burt Ek, MD at 03/20/2021 8:41 AM EST     Emergency Department Procedures     None    ED Course and MDM     MAKSYMILIAN MABEY is a 66 y.o. male with a history and presentation as described above in the HPI. The patient was evaluated by myself and the ED Attending Physician, Dr. Gae Gallop, MD. All management and disposition plans were discussed and agreed upon. Appropriate labs and diagnostic studies were reviewed as they were made available. Pertinent laboratory studies in medical decision making are listed below.     Asencion Partridge presented with signs and symptoms concerning for acute ischemic stroke (as documented above). Upon presentation, the patient was chronically ill-appearing, afebrile, and hemodynamically stable with an NIHSS of 6 on arrival. Based on an LKN of 0000 with the discovery time of 0300, patient was in the window for a wake-up MRI protocol and possible thrombolysis. Stroke Team was contacted shortly after the patient's arrival with recommendations documented below in the ED course.  Per chart review, patient has a history of prior L MCA strokes initially in 2017 but reportedly left prior to completion of his work-up.  He then represented with acute onset of both expressive and receptive aphasia in March 2023 and was found to have an acute L M2 LVO.  CTP was obtained with a favorable penumbra (25 mL ischemic core and 48 mL penumbra) so the patient went for EVT which was ultimately unsuccessful due to severe stenosis and inability to access  the L M2.  Per chart review, patient was discharged with severe expressive and receptive aphasia and appears to be working with speech therapy as an outpatient.  Patient was initially discharged on aspirin and Plavix  though his Plavix was discontinued on 06/26/2021.  She continues to take his aspirin as prescribed and his wife reports he is taking all of his medications as prescribed.  Overall, clinical presentation is most consistent with likely recrudescence so will pursue broader work-up including UA to assess for any UTI that could contribute to possible recrudescence.    Remaining diagnostics and ED course as follows:  ED Course as of 10/17/21 0708   Wed Oct 17, 2021   0550 POC Glucose Monitoring Device: 100   [phone] Discussed with Stroke Team who agreed that his CT head was notable only for evidence of prior stroke in the left MCA territory consistent with his history of a prior left M2.  CTA imaging without any notable new LVO or significant change from priors.  As such, engaged in shared decision-making with the patient's wife who would like to proceed with a wake-up MRI at this time.  Discussed with MRI techs so patient will proceed to MRI at this time    Patient treated with Versed 2 mg IV to facilitate MRI imaging.  MRI safety checklist completed by RN   [phone] ED ECG 12-Lead (MUSE)  ECG reviewed without evidence of STEMI or other findings suggestive of acute ischemia.  Notable only for an isolated T wave inversion in lead aVL.  No significant acute changes compared to most recent prior ECG on 03/29/2021   0621 CBC(!):    WBC 8.0   RBC 4.82   Hemoglobin 12.0(!)   Hematocrit 36.4(!)   MCV 75.6(!)   MCH 25.0(!)   MCHC 33.0   RDW 23.3(!)   Platelet Count 222   MPV 8.9  No leukocytosis.  Normal platelets.  Notable only for mild anemia with a hemoglobin of 12.0 which is above patient's baseline compared to most recent laboratory studies from 6 months ago   0621 Basic metabolic panel(!):    Sodium 135   Potassium 3.6   Chloride 103   Carbon Dioxide (CO2) 24   Anion Gap 8   BUN 23   Creatinine 1.66(!)   Glucose 99   Calcium 8.9   Calculated Osmolality, Serum 284   EGFR 45  Notable only for an elevated creatinine to 1.66 up from a  baseline of ~1 from approximately 6 months ago.  Unclear if this represents acute AKI versus development of CKD   0621 Magnesium: 2.0  Within normal limits   0622 Hepatic Function Panel(!):    Bili, Total 0.7   Bilirubin, Direct 0.1   AST (SGOT) 11(!)   ALT (SGPT) 5(!)   Alkaline Phosphatase 87   Protein, Total 7.4   Albumin 4.5   Bilirubin, Indirect 0.6  Within normal limits   0622 Code Stroke-CT Head WO contrast  1.  Left parietotemporal encephalomalacia and remote basal ganglia lacunar infarcts.   2.  No intracranial mass effect or hemorrhage.    0630 Protime-INR:    Protime 13.3   INR 1.0  Within normal limits   0640 Anti-Xa LMW Heparin(!): <0.10  Undetectable   0647 APTT: 28.7  Within normal limits   0650 MRI Head limited WO contrast  Discussed wake-up MRI with stroke team who do note 2 punctate areas consistent with acute stroke though will I within his prior stroke  bed with surrounding FLAIR signal.  Stroke Team suggests that these small areas may be contributing to the worsening of his aphasia this morning though the risks of thrombolysis outweigh potential benefits given high risk of potential bleeding.  As such, we will not proceed with TNK at this time.  We will consult neuro for further evaluation and management determine whether patient needs further stroke optimization given recurrent left MCA strokes   0708 At this time I am going off-service and will be signing out care of this patient to my colleague Dr. Malachy Mood for further care. My colleague's responsibilities will include:  - Follow-up pending hsTn, UA, and COVID/influenza testing  - Follow-up CXR and formal MRI head, CTA head/neck reads  - Consult Neuro  - Reassessment and disposition     Medical Decision Making  Problems Addressed:  Aphasia: chronic illness or injury with exacerbation, progression, or side effects of treatment    Amount and/or Complexity of Data Reviewed  Independent Historian: spouse  External Data Reviewed: notes.  Labs:  ordered. Decision-making details documented in ED Course.  Radiology: ordered. Decision-making details documented in ED Course.  ECG/medicine tests: ordered. Decision-making details documented in ED Course.    Risk  Prescription drug management.    Consults:  Stroke Team    Summary of Treatment in ED:  Medications   OMNIPAQUE (iohexol) 350 mg iodine/mL 80 mL (80 mLs Intravenous Given 10/17/21 0546)   midazolam (PF) (VERSED) injection 2 mg (2 mg Intravenous Given 10/17/21 0600)     Impression     1. Aphasia       Disposition     At this time I am going off-service and will be signing out care of this patient to the oncoming provider. See ED Course above for additional detail.    Follow-Up/Future Appointments:  No follow-up provider specified.    Future Appointments   Date Time Provider Department Center   11/02/2021  9:00 AM Ernestine Mcmurray, MD [facility] P[facility]     Discharge Medications:  New Prescriptions    No medications on file       Clydie Braun, MD  [facility], PGY-3    This note was dictated using voice-recognition software, which occasionally leads to inadvertent typographic errors.     Clydie Braun, MD  Resident  10/17/21 [phone]

## 2021-10-17 NOTE — Unmapped (Signed)
Pt arrived with visitor for stroke like S/S. Visitor stated last known normal was 3 hours ago. Pt known stroke HX. Pt presents more confused and with L eye redness. Pt in CT scan.

## 2021-10-17 NOTE — Unmapped (Addendum)
Arrives with complaint of worsening expressive and perceptive aphasia. Hx of aphasia from previous CVA but per visitor current aphasia is worse than baseline. LKW was 0000. PT complaint of headache onset at 0300.

## 2021-10-17 NOTE — Unmapped (Signed)
Report received from [facility]. Pt resting in stretcher, on BS CMU with VSS. Pt awaiting further dispo. NAD. Family at [facility].

## 2021-10-17 NOTE — Unmapped (Signed)
Department of Neurology  History & Physical    Patient: [name]  MRN: [id number]  CSN: [id number]    Chief Complaint     Aphasia, worsened from baseline    History of Present Illness     KRISTI HYER is a 66 y.o. Black or [place] American man with PMH of L MCA strokes (2017, 02/2021, and 03/2021), HTN, DM, depression presenting with worsening of his baseline aphasia.     Prior left MCA stroke 2017 left AMA, presented again 02/2021 and 03/2021. At baseline has mild expressive aphasia. Presented today with worsening aphasia and more receptive component. Discharged on dapt x 90 days, atorva 80 to IPR. Now continued on ASA, per wife, full dose, records suggest 81. Significant other at bedside states he typically is able to understand her and string together phrases.  Additionally, patient's acute reports is usually pretty consistent.  However, she has noticed that there has been a deterioration in the past few days in the number of interchanged words. She states that she has had more difficulty understanding him, to the point where some statements are entirely unintelligible. Of note, she is able to understand the individual words.     Acute ischemic stroke due to left MCA (M2) occlusion with history of LMCA strokes  Sx: Woke up with acute onset of aphasia at approx. 0800 on 03/28/21, LKN approx. 2300 on 03/27/21. Initial NIHSS 6.  CT: Evolution of stroke in similar territory to prior from 03/18/21, L MCA.  CTA: Left M2 occlusion.  CTP: 25 mL ischemic core and 48 mL penumbra  MRI: Acute infarct of L parietal-temporal region. L M2 occlusion with abnormal flow void. Interval decrease in spotty diffusion signal in L occipital lobe (evolving subacute infarct).  TTE: LVEF 65-60%, no RWMA, G1DD, normal LA, no shunt/thrombus.  Basic stroke risk labs: A1C 5.8% (03/20/2021); TSH 1.03 (03/20/2021). LDL 116  Attempted thrombectomy 3/1 but M2 was too stenosed and wire could not be passed. Thrombectomy was not successful and stenosis  could not be reached.   Etiology: intracranial atherosclerosis    Cardiac monitoring was only about 3 days. Sinus rhythm, 6 beat v tach, PVCs and supraventricular premature complexes. No afib was detected. Per patient's caregiver, he was on DAPT for 3 months followed by just continuing Aspirin 81 mg.     No showed to two neurology appointments with Dr. Abelina Bachelor    Patient endorses worsened speech, denies vision loss, diplopia, n/t, weakness, vertigo. He has been experiencing productive cough.    Review of Systems     Review of Systems   Constitutional:  Negative for chills and fever.   Respiratory:  Positive for cough and sputum production. Negative for shortness of breath.    Cardiovascular:  Negative for chest pain and palpitations.   Gastrointestinal:  Negative for nausea and vomiting.     Past Medical History     Past Medical History:   Diagnosis Date    COPD (chronic obstructive pulmonary disease) (CMS-HCC)     Diabetes mellitus (CMS-HCC)     Dysphagia     GERD (gastroesophageal reflux disease)     Hematuria     Hepatitis     Hypertension      Past Surgical History     Past Surgical History:   Procedure Laterality Date    ESOPHAGOGASTRODUODENOSCOPY N/A 11/19/2013    Procedure: ESOPHAGOGASTRODUODENOSCOPY WITH MAC;  Surgeon: Charlane Ferretti, MD;  Location: [facility] ENDOSCOPY;  Service: Gastroenterology;  Laterality: N/A;  FOOT SURGERY Left     HERNIA REPAIR      about 5 yrs ago       Family History     History reviewed. No pertinent family history.      Social History     Social History     Socioeconomic History    Marital status: Legally Separated     Spouse name: Not on file    Number of children: Not on file    Years of education: Not on file    Highest education level: Not on file   Occupational History    Not on file   Tobacco Use    Smoking status: Every Day     Packs/day: 0.25     Years: 5.00     Pack years: 1.25     Types: Cigarettes    Smokeless tobacco: Never   Substance and Sexual Activity    Alcohol  use: No    Drug use: No    Sexual activity: Yes     Partners: Female     Birth control/protection: Condom   Other Topics Concern    Caffeine Use Yes    Occupational Exposure Not Asked    Exercise No    Seat Belt No   Social History Narrative    Not on file     Social Determinants of Health     Financial Resource Strain: Not on file   Physical Activity: Not on file   Stress: Not on file   Social Connections: Not on file   Housing Stability: Not on file       Medications     Home Meds:  Prior to Admission medications    Medication Sig Start Date End Date Taking? Authorizing Provider   ARIPiprazole (ABILIFY) 5 MG tablet Take 1 tablet (5 mg total) by mouth daily.    Historical Provider, MD   aspirin 81 MG chewable tablet Chew 1 tablet (81 mg total) by mouth daily with breakfast. 04/06/21   Ronalee RedSarah Doran, MD, PhD   atorvastatin (LIPITOR) 80 MG tablet Take 1 tablet (80 mg total) by mouth at bedtime. 07/31/15   Ellwood SayersJustin Goranovich, MD   cloNIDine (CATAPRES) 0.2 mg/24 hr patch Place 1 patch onto the skin once a week. 04/09/21   Ronalee RedSarah Doran, MD, PhD   nicotine (NICODERM CQ) 14 mg/24 hr Place 1 patch onto the skin daily. 04/06/21   Ronalee RedSarah Doran, MD, PhD   nicotine, polacrilex, (NICORETTE) 2 mg gum Take 1 each (2 mg total) by mouth every hour as needed for Smoking cessation. 04/06/21   Ronalee RedSarah Doran, MD, PhD   tamsulosin (FLOMAX) 0.4 mg Cap Take 1 capsule (0.4 mg total) by mouth at bedtime. Take 0.4 mg by mouth daily 01/05/20   Loel DubonnetEmily F Green, PA   valsartan (DIOVAN) 80 MG tablet Take 1 tablet (80 mg total) by mouth daily. 04/06/21   Ronalee RedSarah Doran, MD, PhD        Vital Signs     Temp:  [98.3 F (36.8 C)] 98.3 F (36.8 C)  Heart Rate:  [69-90] 69  Resp:  [14-20] 15  BP: (160-194)/(90-119) 160/96  No intake or output data in the 24 hours ending 10/17/21 1537    Physical Exam     General: well-appearing, laying in bed, in no acute distress  HEENT: tracks with eyes, no scleral icterus. Left sub-conjunctival hemorrhage medial to the iris.  Neck:  supple, full ROM.  Pulmonary: normal respiratory effort, no cyanosis.  CVS:  skin warm and well-perfused.  Abdomen:  soft, nondistended, nontender.   Skin: warm and nondiaphoretic.  Psych: Appropriate interaction.    Mental Status:   Language/Speech: No dysarthria. Follows simple commands (stick out your tongue, smile) but not complex commands (touch my finger with your right pointer finger). Requires multiple demonstrations for complex commands. Unable to correctly name the items pictured in the NIHSS booklet. Unable to correctly name a pen. Speech is generally fluid. However, word choice when answering questions is not appropriate. When asking his own questions, he is appropriate/intelligible.  Atten/concentration: awake and alert.  Orientation: oriented to person. Unclear if oriented to time and placed. Contextually, patient seems to understand that he is in a hospital.  Memory:  Not tested secondary to expressive aphasia    Cranial Nerve:  CN II: visual acuity intact, assessed by finger counting. pupils equal, round, reactive to light with accommodation  CN III/IV/VI: EOM intact without nystagmus  CN V: facial sensation normal in V1-3  CN VII: facial activation intact and symmetric  CN VIII: hearing intact to conversation  CN IX/X: palate elevation symmetric  CN XI: shoulder shrug symmetric  CN XII: tongue protrudes midline    Motor: No atrophy  5/5 power in all 4 extremities   R L   R L       Hip Flex 5 5   Biceps 5 5  Knee Ext 5 5   Triceps 5 5  Knee Flex 5 5   Grip 5 5         Sensory:light touch intact bilaterally upper and lower extremities    Coord: no dysmetria or tremor noted    Gait:  deferred    Deep Tendon Reflexes:    R L   Biceps 2 2   Triceps 2 2   Brachioradialis 2 2   Patellar 1 1   Achilles 1 1          Pertinent Labs     Creatinine: 1.66  WBC: 8.0  Hgb: 12.0  Anti-Xa LMW Heparin <0.10    Other renal panel, CBC, differential WNL.    EKG     Ventricular Rate:  71  BPM  Atrial Rate:  71   BPM  P-R Interval:  146  ms  QRS Duration:  86  ms  QT:  420  ms  QTc:  456  ms  P Axis:  79  degrees  R Axis:  65  degrees  T Axis:  101  degrees  Diagnosis Line:  INTERPRETATION NOT AVAILABLE--ECG READ IN ER ^ Confirmed by PHYSICIAN, ER ([id number]), editor Tawni Pummel ([id number]) on 10/17/2021 2:16:35 PM     Pertinent Imaging     EXAM: MRI HEAD LIMITED WO CONTRAST    INDICATION: wake up stroke protocol, discovery 0300    TECHNIQUE: MRI HEAD LIMITED WO CONTRAST according to the wake up stroke protocol including FLAIR T2 and DWI axial images    COMPARISON: MR dated 03/30/2021 and CT dated 10/17/2021    FINDINGS:    2 punctate foci of diffusion restriction are noted within the left occipital subcortical region and left corona radiata (series 4 images 21 and 22). No mass effect is seen.  A wedge-shaped area of encephalomalacia in the left temporal parietal lobe is consistent with remote infarct, unchanged compared to CT scan. Remote lacunar infarcts are present in the bilateral corona radiata and left pons. Moderate confluent white matter signal hyperintensities are present.    The  ventricles and sulci are enlarged consistent with mild diffuse atrophy.    Impression   IMPRESSION:    1.  Punctate foci of acute ischemia in the left occipital subcortical region and left corona radiata. No mass effect.  2.  Moderate small vessel disease and remote infarcts as described.     EXAM: CTA NECK W AND OR WO CONTRAST  EXAM: CTA HEAD W AND OR WO CONTRAST W VIZ LVO AI    INDICATION: Neuro deficit, acute, stroke suspected    TECHNIQUE: Axial thin section CT angiography of the neck and head was performed with 80 mL of IOHEXOL 350 MG IODINE/ML INTRAVENOUS SOLUTION administered intravenously. 3-D reconstructions of the cervical and cranial vessels were performed on a separate 3-D workstation, including MIP, curved MPR, and VRT images utilizing a radiologist approved protocol and were interpreted along with source images. Image data from the CTA  was sent to [email or web address] for LVO analysis. This analysis was sent to the stroke team for clinical use.      COMPARISON: CTA head neck 03/28/2021.     FINDINGS:    The diagnostic quality of the examination is adequate.    Pulmonary arteries: No included emboli.    Arch anatomy and vessel origins: Standard three-vessel arch anatomy. Patent innominate, bilateral common carotid, subclavian, and vertebral artery origins.    Right carotid system: Calcified and soft plaque at the bifurcation with no stenosis.    Left carotid system: Soft plaque at the bifurcation with no stenosis.    Right vertebral artery: Normal.    Left vertebral artery:  Mild luminal irregularity without stenosis of the left vertebral artery origin, likely atherosclerotic, otherwise normal enhancement distally..    Intracranial posterior circulation: Normal bilateral V4 segments. Bilateral PICA, anterior inferior cerebellar, and superior cerebellar arteries enhance normally. The basilar artery enhances normally. Posterior cerebral arteries enhance normally.    Intracranial anterior circulation: Mild to moderate calcified plaques involve the bilateral cavernous and supraclinoid internal carotids with mild left supraclinoid ICA stenosis, unchanged. Focal severe stenosis at the left M1 origin, progressed since prior, as well as decreased distal left MCA collateral vessels in the region of left temporoparietal encephalomalacia. Otherwise the left MCA vasculature enhances normally. Right MCA enhances normally. The bilateral anterior cerebral arteries enhance normally.    Venous structures: Venous structures are not well opacified in the arterial phase and cannot be assessed.    Extravascular findings: Intracranial findings are reported separately. Moderate upper lung zone predominant emphysema bilaterally. Multilevel cervical degenerative disc disease change without high-grade spinal canal stenosis at any cervical level. No suspicious lung nodules included.    3D  reconstructions of the neck vessels including MIP and curved MPR images reconstructed on a separate 3D workstation show the above-described findings to better effect.  3D reconstructions of the cranial vessels including MIP and VRT images reconstructed on a separate 3D workstation show the above-described findings to better effect    Impression   IMPRESSION:    Neck  1.  Mild atherosclerotic disease with no significant carotid or vertebral artery stenosis.  2.  No dissection or pseudoaneurysm.    Head  1.  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  2.  Mild left supraclinoid internal carotid artery stenosis, unchanged since prior.  3.  No large vessel occlusion.       Assessment & Plan     ROMANI WILBON is a 66 y.o. Black or [place] American man with PMH of L MCA  strokes (2017, 02/2021, and 03/2021), HTN, DM, depression presenting with worsening of his baseline aphasia.     #Community-Acquired Pneumonia, bibasilar  Patient presented with worsening of baseline aphasia secondary to prior L MCA stroke. Concern for recrudescence prompted an infectious workup.  WBC normal at 8.0.  Chest x-ray with patchy bibasilar airspace opacities may represent atelectasis or pneumonia.  Patient has a documented allergy to penicillins (hives).  However, he has safely received ceftriaxone in the past.  Discussed antibiotic options with pharmacist.  Given documented tolerance to ceftriaxone, will proceed with IV ceftriaxone.    PLAN:  -Ceftriaxone 2 g daily, 5-day course (EOT 10/22/2021)    #Recrudescence, worsening of baseline aphasia  #Acute Punctate Ischemia, Left Occipital Subcortical, Left Corona Radiata  #Hx Left MCA Stroke, residual expressive aphasia  Infectious workup has been significant for a bibasilar pneumonia seen on CXR. UA was unremarkable. Wake up MRI in the ED with punctate foci of acute ischemia in the left occipital subcortical region, corona radiata.  There is also a wedge-shaped area of  encephalomalacia in the left temporal/parietal lobe and left MCA territory unchanged from prior scan.  Which was associated/consistent with mild diffuse atrophy.  CTA head neck significant for severe short segment M1 stenosis, progressed from prior imaging in March.  Left supraclinoid internal carotid artery stenosis which is unchanged.  Mild atherosclerotic disease present in the neck with no significant carotid or vertebral artery stenosis. Given worsening of the M1 stenosis since prior CTA, will start patient on DAPT for 90 days per [facility]. In setting of left eye sub-conjunctival hemorrhage, contacted ophthalmology who advised that this is not a contraindication to DAPT.    PLAN:  -ASA 325 mg load, followed by ASA 81 mg daily  -Plavix 75 mg daily  -counseled on smoking cessation  -in setting of significant L MCA territory encephalomalacia, will obtain rEEG. If epileptiform activity present, will consider AED.    Dispo/GOC:   FLOOR  FULL CODE    COVID-19 Status:  Negative    DVT ppx: Lovenox 40 mg daily  GI ppx: Not indicated  Foley: Not indicated  Bowel Regimen: MiraLax  Fluids: Oral intake  Electrolytes: Replete PRN  Nutrition:  Diet/Nutrition Orders    Diet regular     Frequency: Effective Now     Number of Occurrences: Until Specified     Order Questions:      Suicide/Behavior Risk Modification? No       Code Status: Full Code    Signed:  Nicholes Stairs, MD  10/17/2021, 3:37 PM

## 2021-10-17 NOTE — Unmapped (Signed)
Case transferred to this Child psychotherapist (SW) by previous shift SW, [employer].    The patient did not have any needs during this SW's shift.    Patient transferred to C-30.  Contact SW if needs arise.    Jerilynn Birkenhead MSW, [facility] Social Worker   [phone]

## 2021-10-17 NOTE — Unmapped (Signed)
Assumed care of patient at this time.  Report received from [facility].  Patient found to be wondering the hallway, expressive aphasia is noted.  Updated patient on plan of care and assisted patient back to bed.  Breakfast ordered per patients request.  Will continue to monitor.

## 2021-10-17 NOTE — Unmapped (Signed)
[facility]    Trauma / Critically Ill Assessment      [name]  [id number]    Reason for Referral / Presenting Problem:   SRU patient (Aphasia)    Family Contact and Involvement:  HCPOA [name](Bethany Achor, [phone])    Assessment and Social Work Interventions:  Patient is a 66 year old male who self [facility].  Social worker introduced self and explained the reason for the visit.  Social worker escorted patient's HCPOA to bedside.  ED social work will remain available if needed.     Safety Concerns:   No safety concerns noted or observed.      Referral / Disposition Plan:    Patient is being passed on to next shift for disposition.

## 2021-10-17 NOTE — Unmapped (Signed)
ED Attending Attestation Note    Date of service:  10/17/2021    This patient was seen by the resident physician.  I have seen and examined the patient, agree with the workup, evaluation, management and diagnosis. The care plan has been discussed and I concur.  I have reviewed the ECG and concur with the resident's interpretation.    My assessment reveals a 66 y.o. male presenting with reported increased level of agitation and possible worsening aphasia symptoms noticed this morning.  Last known well last night.  Patient is able to follow direction with all 4 extremities.  Baseline expressive aphasia in conversation with family.[name]

## 2021-10-17 NOTE — Unmapped (Shared)
[facility]  Neurology Department  HISTORY AND PHYSICAL    10/17/2021               7:50 AM  Patient:[name]  LOS: 0 days    House officer:  Chanetta Marshall, MD   Requesting MD/Contact #:  Gae Gallop, MD      CC/HPI     [name] is a 66 y.o. Black or African American *** handed male with PMH of L MCA strokes (2017, 02/2021, and 03/2021), HTN, DM  presenting with worsening of his baseline aphasia.    At baseline has mild expressive aphasia.     Significant other at bedside an states he typically is able to understand her and string together phrases. Per her, he did complete 90 days DAPT after most recent stroke and is now taking daily aspirin 325 mg and atorvastatin.     Past Medical History     Past Medical History:   Diagnosis Date    COPD (chronic obstructive pulmonary disease) (CMS-HCC)     Diabetes mellitus (CMS-HCC)     Dysphagia     GERD (gastroesophageal reflux disease)     Hematuria     Hepatitis     Hypertension      Past Surgical History:   Procedure Laterality Date    ESOPHAGOGASTRODUODENOSCOPY N/A 11/19/2013    Procedure: ESOPHAGOGASTRODUODENOSCOPY WITH MAC;  Surgeon: Charlane Ferretti, MD;  Location: [facility] ENDOSCOPY;  Service: Gastroenterology;  Laterality: N/A;    FOOT SURGERY Left     HERNIA REPAIR      about 5 yrs ago       Family History     History reviewed. No pertinent family history.    Social History     Tobacco:  Alcohol:  Illicit Drug Use:  Lives with    Allergy     Allergies   Allergen Reactions    Penicillins Hives       ROS     General: No fatigue, fever, chills  Ophthalmic: No visual blurring, diplopia, eye redness/tearing  Ears: no change in hearing, no ear pain  Allergy and Immunology: No rashes, swelling  Heme: No easy bruising, bleeding  Respiratory: No cough, SOB, wheezing  Cardiovascular:No chest pain, palpitations  Gastrointestinal: No N/V/D/C or abd pain  GU: No dysuria, increased urgency/frequency  Musculoskeletal: No muscle or joint  pain  Neurological : No dizziness/lightheadedness, LOC, focal weakness, parasthesias, dysphagia, seizures.   Psych: No depression, anxiety, or substance abuse.  Dermatological: No rashes, lesions, itching    Medications     No current facility-administered medications on file prior to encounter.     Current Outpatient Medications on File Prior to Encounter   Medication Sig Dispense Refill    ARIPiprazole (ABILIFY) 5 MG tablet Take 1 tablet (5 mg total) by mouth daily.      aspirin 81 MG chewable tablet Chew 1 tablet (81 mg total) by mouth daily with breakfast. 30 tablet 0    atorvastatin (LIPITOR) 80 MG tablet Take 1 tablet (80 mg total) by mouth at bedtime. 30 tablet 0    cloNIDine (CATAPRES) 0.2 mg/24 hr patch Place 1 patch onto the skin once a week. 4 patch 0    nicotine (NICODERM CQ) 14 mg/24 hr Place 1 patch onto the skin daily. 28 patch 0    nicotine, polacrilex, (NICORETTE) 2 mg gum Take 1 each (2 mg total) by mouth every hour as needed for Smoking cessation. 100  Stick 0    tamsulosin (FLOMAX) 0.4 mg Cap Take 1 capsule (0.4 mg total) by mouth at bedtime. Take 0.4 mg by mouth daily 30 capsule 3    valsartan (DIOVAN) 80 MG tablet Take 1 tablet (80 mg total) by mouth daily. 30 tablet 0     Scheduled Meds:  Continuous Infusions:  PRN Meds:.    Physical Exam:     Blood pressure (!) 165/117, pulse 77, temperature 98.3 F (36.8 C), temperature source Oral, resp. rate 17, SpO2 100 %.  Physical Exam:  General Exam: Pt in NAD  HEENT: NCAT, no nuchal rigidity  CV: RRR, no edema  Lungs: Breathing comfortably on room air, no wheezing  Abd: Soft, NT      Neurologic Physical Exam:    Mental Status:  Level of alertness: Alert and cooperative  Orientation: self, location, day, date, month, year, situation, president.    Attention: Able to spell WORLD forwards and backwards. Months forward/backward. Digit span.  Memory: ***  Language: Speech is fluent with normal prosody, articulation, and appropriate grammar. No paraphasic  errors.  Naming: pen, glasses, objects in NIHSS book.   Comprehension: follows simple and complex commands, answers yes/no appropriately, points to objects, syntax-dependent meaning The lion was killed by the tiger  Repetition: single words, simple sentence, complex sentence  Reading: reads words and phrases in NIHSS book  Calculations: 7+7, 9+9, 79+79  No right-left confusion  No finger agnosia  Praxis - comb hair, brush teeth, play piano, play flute, stop traffic, beckoning sign  Chief Financial Officer, Train/Bicycle    CN:   Fundoscopic exam: disc margin sharp and clear: no papilledema or hemorrhage***fundus not well visualized on non-dilated exam  CN II:  visual fields full to confrontation w/o extinction   CN III/IV/VI: Extraocular movements intact  and pupils equal, round, reactive to light with accommodation  CN V: Facial sensation normal in VI, VII, VII  CN VII: facial motion intact and symmetric  CN VIII: Hearing intact to finger rub  CN IX/X: palate elevation symmetric   CN XI: sternocleiomastoid 5/5 symmetric strength  CN XII: Tongue protrudes midline    Motor: Normal tone throughout, no atrophy present, 5/5 muscle strength in all   major muscle groups UE & LE, no drift present***    Strength:     R L   R L   Deltoid 5 5  Hip Flex 5 5   Biceps 5 5  Knee Ext 5 5   Triceps 5 5  Knee Flex 5 5   Wrist Ext 5 5  Plantar Flex 5 5   Wrist Flex 5 5  Ankle Dorsiflex 5 5   IO 5 5       Grip 5 5           Sensory: intact diffusely to light touch, temp and vibratory touch; proprioception intact;   negative Romberg's sign. No sensory extinction on double simultaneous stimulation    Deep Tendon Reflexes:    R L   Biceps 2+ 2+   Triceps 2+ 2+   Brachioradialis 2+ 2+   Patellar 2+ 2+   Achilles 2+  2+   Toes down down     Hoffman: neg/neg    Coordination/Cerebellum: No dysmetria, dysdiadochokinesia, or ataxia noted; no tremor    Gait: normal gait and base w/ standard walk and tandem walk    Additional  Exam:  Frontal Release Signs: {NEU FRONTAL RELEASE REFLEXES:24345}  Clonus: {Desc; normal/abnormal w/wildcard:19060}  Dysarthia:{NEU ZOXWRUEAV:40981}      LABS     Lab Results   Component Value Date    GLUCOSE 99 10/17/2021    BUN 23 10/17/2021    CO2 24 10/17/2021    CREATININE 1.66 (H) 10/17/2021    K 3.6 10/17/2021    NA 135 10/17/2021    CL 103 10/17/2021    CALCIUM 8.9 10/17/2021     No results found for: GLUFAST  Lab Results   Component Value Date    WBC 8.0 10/17/2021    HGB 12.0 (L) 10/17/2021    HCT 36.4 (L) 10/17/2021    MCV 75.6 (L) 10/17/2021    PLT 222 10/17/2021     Lab Results   Component Value Date    INR 1.0 10/17/2021     Lab Results   Component Value Date    CHOLTOT 174 03/30/2021    TRIG 57 03/30/2021    HDL 47 (L) 03/30/2021    LDL 116 03/30/2021     Lab Results   Component Value Date    HGBA1C 5.8 (H) 03/20/2021     No results found for: PROTEINCSF, GLUCCSF, CFLCCOM, CULTCSF    Recent neuroradiology studies  {NEURORADIMP14DAYS:22646}    IMPRESSION/ RECOMMENDATION :   ABDALLAH HERN is a 66 y.o. male on hospital day 0.  The issues being addressed in today's encounter are as follows:          Inpatient Management:   Diet: ***  IV fluids: NaCl 0.9% at ***ml/hr x *** hrs  Lines: PIV x2, ***  Telemetry: yes - stroke/no***  ASA/Statin: ***  Pain management: acetaminophen prn***  DVT prophylaxis: SQH/SCD***  Stress ulcer prophylaxis: none***  Precautions/Isolation: none***   Restraints: none***  Toileting:  Last BM, Bowel Regimen: none recorded, n/a***  Foley Catheter: *** - day ***  Ambulation: ***  Ancillary services: PT/OT/SLP/SW***      Code Status: Prior  Dispo: ***    Chanetta Marshall, MD  Neurology Resident, PGY-4  [facility]  10/17/2021  7:50 AM

## 2021-10-17 NOTE — Unmapped (Signed)
Assumed care of patient who is up to restroom. Other staff found RN to tell that patient is up walking around and appearing to need help-this RN to bedside. Introduced self to patient who appears agitated and saying, it's shit. I need to shit. They wont let me shit! That right there, they won't let me. When asked who he is referring to, patient pointing to toilet. Patient given additional time to use restroom. Admit team paged to update on patient's status. Per MD Pleuhs, plan to put patient in for transport to the floor and attempt med pass, if patient unwilling, MD to be messaged and will come see patient in ED.

## 2021-10-17 NOTE — Unmapped (Signed)
Patient transported to floor via transporter with all belongings. Patient agreeable to transport. IV found on food tray at bedside. MD updated on patient status and plan to see him upon arrival to floor.

## 2021-10-17 NOTE — Unmapped (Addendum)
Stroke Nurse Navigators have been consulted.  Will follow closely in coordination with primary team.  Please call Stroke Nurse Navigators with any needs:  [phone].

## 2021-10-17 NOTE — Unmapped (Signed)
Attempted to gain IV access on patient refused. Asked patient if I can try again in the AM patient in agreement.

## 2021-10-17 NOTE — Unmapped (Signed)
Identifying Information:  Name: [name]  MRN: [id number]  DOB: 1955-04-26  Interpreting Physician: Dr Jannett Celestine  Reporting Physician: Lurlean Leyden, MD  Referring Physician: Madaline Brilliant, MD  Date of EEG: 10/17/21  Procedure Location: Inpatient     Clinical History:  [name] is a 66 y.o. male with a reported history of L MCA strokes (2017, 02/2021, and 03/2021), HTN, DM, depression who was referred for worsening aphasia.    Current Medications:   Current Facility-Administered Medications   Medication Dose Frequency Provider Last Admin    acetaminophen  650 mg Q6H PRN Quin Hoop, MD      ARIPiprazole  5 mg Daily 0900 Nicholes Stairs, MD 5 mg at 10/18/21 7829    aspirin  81 mg Daily with breakfast Quin Hoop, MD 81 mg at 10/18/21 5621    atorvastatin  80 mg Nightly (2100) Quin Hoop, MD 80 mg at 10/17/21 2329    cefTRIAXone (ROCEPHIN) IVPB  2 g Q24H Nicholes Stairs, MD 2 g at 10/17/21 1613    cloNIDine HCL  0.2 mg BID Nicholes Stairs, MD 0.2 mg at 10/18/21 0813    clopidogreL  75 mg Daily 0900 Quin Hoop, MD 75 mg at 10/18/21 0813    diltiazem  240 mg Daily 0900 Nicholes Stairs, MD 240 mg at 10/18/21 1306    enoxaparin  40 mg Daily 0900 Quin Hoop, MD 40 mg at 10/17/21 1031    metoprolol succinate  100 mg Daily 0900 Nicholes Stairs, MD 100 mg at 10/18/21 3086    nicotine  1 patch Daily 0900 Quin Hoop, MD 1 patch at 10/18/21 5784    And    nicotine (polacrilex)  2 mg Q1H PRN Quin Hoop, MD      oxybutynin  5 mg BID Nicholes Stairs, MD 5 mg at 10/18/21 6962    pantoprazole  40 mg DAILY 0600 Nicholes Stairs, MD 40 mg at 10/17/21 2329    polyethylene glycol  17 g Daily 0900 Nicholes Stairs, MD 17 g at 10/18/21 0813    tamsulosin  0.4 mg Nightly (2100) Nicholes Stairs, MD 0.4 mg at 10/17/21 2329       Routine EEG start date & time: 10/17/21 2348    Technical Summary:  21 channels of EEG were recorded in a digital format on a patient who is reported to be awake during the recording. The patient was not  sleep deprived prior to the EEG.    The background rhythm consisted of well-developed, well-regulated 10 Hz alpha activity, maximal over the posterior head regions and reactive to eye opening and closure.     The record was remarkable for the presence of:     Focal slowing in the form of intermittent polymorphic delta activity over the left temporal head region.    Photic stimulation and hyperventilation were performed and produced no abnormalities. During the recording stage II sleep  was not seen. The EKG lead revealed no rhythm abnormalties.    EEG Interpretation:   The EEG was abnormal due to the presence of:    Focal slowing over the left temporal head regions. Focal slowing is consistent with a focal disturbance of cerebral function and an underlying structural lesion should be considered.    No focal, lateralizing, or epileptiform features were seen during the recording.    Clinical correlation is recommended.    Lurlean Leyden, MD  Neurology PGY-3   10/18/2021 1:40 PM

## 2021-10-17 NOTE — Unmapped (Signed)
Patient care assumed. Breathing is easy and unlabored. Patient resting with eyes closed will continue to monitor.

## 2021-10-17 NOTE — Unmapped (Signed)
Ready and clean bed assigned to [id number]. Pt updated on plan of care including transfer and is agreeable. Receiving RN may call [phone] to consult ED RN with questions regarding patients care. Pt is leaving the department in stable condition with all personal items in possession.   Ordered medications that have been received from Pharmacy will be tubed to receiving unit.  The patient does not have a patient monitor at bedside in the ED.  Most recent vitals: BP 167/90 (BP Location: Right upper arm, Patient Position: Lying)   Pulse 67   Temp 98.3 F (36.8 C) (Oral)   Resp 12   SpO2 99%     Magda Kiel ,RN

## 2021-10-17 NOTE — Unmapped (Signed)
PT to MRI

## 2021-10-18 ENCOUNTER — Inpatient Hospital Stay: Admit: 2021-10-18 | Payer: Medicare (Managed Care)

## 2021-10-18 LAB — COMPREHENSIVE METABOLIC PANEL
ALT: 8 U/L (ref 7–52)
AST: 10 U/L (ref 13–39)
Albumin: 4.4 g/dL (ref 3.5–5.7)
Alkaline Phosphatase: 83 U/L (ref 36–125)
Anion Gap: 9 mmol/L (ref 3–16)
BUN: 18 mg/dL (ref 7–25)
CO2: 29 mmol/L (ref 21–33)
Calcium: 9.3 mg/dL (ref 8.6–10.3)
Chloride: 104 mmol/L (ref 98–110)
Creatinine: 1.32 mg/dL (ref 0.60–1.30)
EGFR: 59
Glucose: 100 mg/dL (ref 70–100)
Osmolality, Calculated: 296 mOsm/kg (ref 278–305)
Potassium: 3.8 mmol/L (ref 3.5–5.3)
Sodium: 142 mmol/L (ref 133–146)
Total Bilirubin: 1.1 mg/dL (ref 0.0–1.5)
Total Protein: 7.7 g/dL (ref 6.4–8.9)

## 2021-10-18 LAB — CBC
Hematocrit: 37 % (ref 38.5–50.0)
Hemoglobin: 12 g/dL (ref 13.2–17.1)
MCH: 24.8 pg (ref 27.0–33.0)
MCHC: 32.4 g/dL (ref 32.0–36.0)
MCV: 76.6 fL (ref 80.0–100.0)
MPV: 8.5 fL (ref 7.5–11.5)
Platelets: 219 10*3/uL (ref 140–400)
RBC: 4.83 10*6/uL (ref 4.20–5.80)
RDW: 22.9 % (ref 11.0–15.0)
WBC: 5.6 10*3/uL (ref 3.8–10.8)

## 2021-10-18 LAB — BASIC METABOLIC PANEL
Anion Gap: 9 mmol/L (ref 3–16)
BUN: 18 mg/dL (ref 7–25)
CO2: 28 mmol/L (ref 21–33)
Calcium: 9.5 mg/dL (ref 8.6–10.3)
Chloride: 105 mmol/L (ref 98–110)
Creatinine: 1.34 mg/dL (ref 0.60–1.30)
EGFR: 58
Glucose: 101 mg/dL (ref 70–100)
Osmolality, Calculated: 296 mOsm/kg (ref 278–305)
Potassium: 3.7 mmol/L (ref 3.5–5.3)
Sodium: 142 mmol/L (ref 133–146)

## 2021-10-18 LAB — LIPID PANEL
Cholesterol, Total: 200 mg/dL (ref 0–200)
HDL: 48 mg/dL — ABNORMAL LOW (ref 60–92)
LDL Cholesterol: 142 mg/dL
Non-HDL Cholesterol, Calculated: 152 mg/dL — ABNORMAL HIGH (ref 0–129)
Triglycerides: 50 mg/dL (ref 10–149)

## 2021-10-18 LAB — HIGH SENSITIVITY TROPONIN: High Sensitivity Troponin: 9 ng/L (ref 0–20)

## 2021-10-18 LAB — HEMOGLOBIN A1C: Hemoglobin A1C: 5.5 % (ref 4.0–5.6)

## 2021-10-18 LAB — POC GLU MONITORING DEVICE: POC Glucose Monitoring Device: 104 mg/dL (ref 70–100)

## 2021-10-18 LAB — PLATELET FUNCTION ASPIRIN: Platelet Function Aspirin: 490 {ARU}

## 2021-10-18 MED ORDER — azithromycin (ZITHROMAX) tablet 500 mg
250 | ORAL
Start: 2021-10-18 — End: 2021-10-19
  Administered 2021-10-18: 21:00:00 500 mg via ORAL

## 2021-10-18 MED ORDER — diltiazem (CARDIZEM CD) 24 hr capsule 240 mg
240 | Freq: Every day | ORAL
Start: 2021-10-18 — End: 2021-10-19
  Administered 2021-10-18 – 2021-10-19 (×2): 240 mg via ORAL

## 2021-10-18 MED FILL — CLOPIDOGREL 75 MG TABLET: 75 75 mg | ORAL | Qty: 1

## 2021-10-18 MED FILL — DILTIAZEM CD 240 MG CAPSULE,EXTENDED RELEASE 24 HR: 240 240 MG | ORAL | Qty: 1

## 2021-10-18 MED FILL — OXYBUTYNIN CHLORIDE 5 MG TABLET: 5 5 MG | ORAL | Qty: 1

## 2021-10-18 MED FILL — CLONIDINE HCL 0.1 MG TABLET: 0.1 0.1 MG | ORAL | Qty: 2

## 2021-10-18 MED FILL — AZITHROMYCIN 250 MG TABLET: 250 250 MG | ORAL | Qty: 2

## 2021-10-18 MED FILL — METOPROLOL SUCCINATE ER 100 MG TABLET,EXTENDED RELEASE 24 HR: 100 100 MG | ORAL | Qty: 1

## 2021-10-18 MED FILL — NICOTINE (POLACRILEX) 2 MG GUM: 2 2 mg | BUCCAL | Qty: 1

## 2021-10-18 MED FILL — ENOXAPARIN 40 MG/0.4 ML SUBCUTANEOUS SYRINGE: 40 40 mg/0.4 mL | SUBCUTANEOUS | Qty: 0.4

## 2021-10-18 MED FILL — POLYETHYLENE GLYCOL 3350 17 GRAM ORAL POWDER PACKET: 17 17 gram | ORAL | Qty: 1

## 2021-10-18 MED FILL — TAMSULOSIN 0.4 MG CAPSULE: 0.4 0.4 mg | ORAL | Qty: 1

## 2021-10-18 MED FILL — ASPIRIN 81 MG TABLET,DELAYED RELEASE: 81 81 MG | ORAL | Qty: 1

## 2021-10-18 MED FILL — NICOTINE 14 MG/24 HR DAILY TRANSDERMAL PATCH: 14 14 mg/24 hr | TRANSDERMAL | Qty: 1

## 2021-10-18 MED FILL — ATORVASTATIN 80 MG TABLET: 80 80 MG | ORAL | Qty: 1

## 2021-10-18 MED FILL — PANTOPRAZOLE 40 MG TABLET,DELAYED RELEASE: 40 40 MG | ORAL | Qty: 1

## 2021-10-18 MED FILL — ARIPIPRAZOLE 5 MG TABLET: 5 5 MG | ORAL | Qty: 1

## 2021-10-18 MED FILL — CEFTRIAXONE 2 GRAM SOLUTION FOR INJECTION: 2 2 gram | INTRAMUSCULAR | Qty: 1

## 2021-10-18 NOTE — Unmapped (Signed)
Pt initially agreeable to get a new IV as long as it was ultrasound guided. USGPIV order was placed and IV team came to bedside to place IV and pt refused. Multiple attempts made. Team paged and made aware, also made them aware that he has scheduled IV antibiotics that cannot be given

## 2021-10-18 NOTE — Unmapped (Signed)
Speech Language Pathology   Reason Patient Not Seen     Name: Danny PartridgeJohnny O Myers  DOB: 1955-10-05  Attending Physician: Madaline BrilliantKatrina L Peariso, MD  Admission Diagnosis: Aphasia [R47.01]  Heartburn [R12]  Urgency of urination [R39.15]  Tachycardia [R00.0]  Constipation, unspecified constipation type [K59.00]  Pneumonia of both lungs due to infectious organism, unspecified part of lung [J18.9]  Depression, unspecified depression type [F32.A]  Hypertension, unspecified type [I10]  Benign prostatic hyperplasia, unspecified whether lower urinary tract symptoms present [N40.0]  Date: 10/18/2021  Precautions: Precautions: n/a  Reviewed Pertinent hospital course: Yes    SLP attempted to see pt for completion of swallow and speech/communication evaluations. Pt covered his head with blankets and said no, no, no, no and I told you I cannot help you, bye bye bye. SLP explained purpose of evaluations and hope to help pt communicate more readily while in hospital. Pt proceeded to say leave or I'll have to do something. SLP to follow up for completion of evaluations as pt is appropriate and schedule permits.    Time Attempted: 1120    Milus MallickGabrielle Tasmine Hipwell, MS, Corporate treasurerCCC-SLP  Speech-Language Pathologist  [facility]  [phone]

## 2021-10-18 NOTE — Unmapped (Signed)
Pt refused ugpiv.

## 2021-10-18 NOTE — Unmapped (Signed)
[name]  Case Management/Social Work Department  Progress Note    Patient Information     Patient Name: [name]  MRN: [id number]  Hospital day: 1  Inpatient/Observation:  Inpatient   Level of Care:    Admit date:  10/17/2021  Admission diagnosis: Aphasia [R47.01]  Heartburn [R12]  Urgency of urination [R39.15]  Tachycardia [R00.0]  Constipation, unspecified constipation type [K59.00]  Pneumonia of both lungs due to infectious organism, unspecified part of lung [J18.9]  Depression, unspecified depression type [F32.A]  Hypertension, unspecified type [I10]  Benign prostatic hyperplasia, unspecified whether lower urinary tract symptoms present [N40.0]    PMH:  has a past medical history of COPD (chronic obstructive pulmonary disease) (CMS-HCC), Diabetes mellitus (CMS-HCC), Dysphagia, GERD (gastroesophageal reflux disease), Hematuria, Hepatitis, and Hypertension.    PCP:  Vevelyn FrancoisELIZABETH ANNE DORIOTT, DO    Home Pharmacy:    [facility] [address] - [place], [place] - [address]  [address]  [place] [id number]  Phone: [phone]     [name]  [address]  [place] [id number]  Phone: [phone]         Medical Insurance Coverage:  Payor: UNITED HEALTHCARE MANAGED MEDICARE / Plan: [facility] MED[facility] / Product Type: Medicare Mngd Care /     Other Pertinent Information     SW completed chart review and attended interdisciplinary rounds with Neurology. PT/OT eval pending. Patient is not medically ready pending ECHO at 0800.    Discharge Plan     Anticipated discharge plan:  TBD, pending PT/OT eval     Anticipated discharge date:  9/21     CM/SW will continue to follow and remain available for discharge planning needs.      [name], MSW, [place]LS[place]  [phone]

## 2021-10-18 NOTE — Unmapped (Signed)
Patient refusing medication and IV access. MD made aware

## 2021-10-18 NOTE — Unmapped (Addendum)
[name],  Here are your hospital discharge instructions:  --> You were hospitalized for: Worsening aphasia due to an infection in your lungs. Whenever you have an infection, your old stroke symptoms can return/worsen. This is called recrudescence. We will complete your treatment of pneumonia with 2 more days of oral antibiotics you can take at home. Do NOT miss these doses of medication.    --CHANGE these medications:   1) Decrease Diltiazem to 120 mg daily     -->STOP Taking these medications:   1) Losartan    2) Hydralazine    --> New medications:   1) Azithromycin 500 mg daily for 2 more days total   2) Clopidogrel (Plavix) 75 mg daily    Thank you, Neurology Team    Future Appointments   Date Time Provider Department Center   10/24/2021 10:00 AM [place], CNP [facility] NEUR GNI UCGNI   10/24/2021 10:40 AM Ortencia Kick, RN [facility] [place] NEUR GNI UCGNI   11/02/2021  9:00 AM Ernestine Mcmurray, MD [place] PULM HOL HOL   01/11/2022  1:20 PM Myrtice Lauth, MD [facility] NEUR GNI UCGNI       Recent Lab Values for you and your doctors:  No results for input(s): TROPONINI, CKMB, BNP in the last 72 hours.  Recent Labs     10/17/21  0553 10/18/21  0758   NA 135 142  142   K 3.6 3.7  3.8   CL 103 105  104   CO2 24 28  29    BUN 23 18  18    CREATININE 1.66* 1.34*  1.32*   GLUCOSE 99 101*  100   CALCIUM 8.9 9.5  9.3   MG 2.0  --    HGBA1C  --  5.5     Recent Labs     10/17/21  0553 10/18/21  0758   WBC 8.0 5.6   HGB 12.0* 12.0*   HCT 36.4* 37.0*   PLT 222 219   INR 1.0  --    PROTIME 13.3  --      Recent Labs     10/17/21  0553 10/18/21  0758   AST 11* 10*   ALT 5* 8   ALKPHOS 87 83   BILITOT 0.7 1.1   BILIDIRECT 0.1  --      Recent Labs     10/18/21  0758   CHOLTOT 200   TRIG 50   HDL 48*   LDL 142     No results for input(s): VITAMINB1, FOLATE in the last 72 hours.

## 2021-10-18 NOTE — Unmapped (Signed)
[place]  Care Management/Social Work Assessment      Patient Information     Patient Name: [name]  MRN: [id number]  Hospital Day: 1  Inpatient/Observation: Inpatient  Admit Date: 10/17/2021  Admission Diagnosis: Aphasia [R47.01]  Heartburn [R12]  Urgency of urination [R39.15]  Tachycardia [R00.0]  Constipation, unspecified constipation type [K59.00]  Pneumonia of both lungs due to infectious organism, unspecified part of lung [J18.9]  Depression, unspecified depression type [F32.A]  Hypertension, unspecified type [I10]  Benign prostatic hyperplasia, unspecified whether lower urinary tract symptoms present [N40.0]  Attending provider: Madaline Brilliant, MD    PCP: Vevelyn Francois, DO  Home Pharmacy:              [phone] [place] - [address]  [address]  [place] [address]  Phone: [phone]     [facility]  [address]  [place] [address]  Phone: [phone]               Issues related to obtaining medications: no issues     Payor Information   Medical Insurance Coverage:  Payor: UNITED HEALTHCARE MANAGED MEDICARE / Plan: [facility] MEDICARE HMO COMPLETE / Product Type: Medicare Mngd Care /   Secondary Payor: [employer]    Functional Assessment   Functional Assessment  Assessment Information Obtained From:: Significant Other (HCPOA Bethany)  Current Mental Status: Awake (aphasia)  Mental Status Prior to Admission: Unable to Assess  Mental Health History: No  Suicide Attempts: No  Activities of Daily Living: Partial Assistance Needed  Work History: Disabled  Marital Status: Significant Other/Life Partner  Number of children and their names: 1 - Val Riles  Relative Search Completed: No  Demographics Correct:: Yes  Discharge Destination: Other (Comment) (TBD, pending PT/OT eval)    Current Living Arrangements   Current Living Arrangements  Current Living Arrangements: Home  Type of Housing: Apartment  Who do you live with?: With Family  What  family member?: SO and 5yo daughter  One Story or Two (check all that apply): One Financial planner the number of steps and rails to enter the residence: 2  Enter the number of steps and rails inside the residence: 34  History of Falls?: No    Electrical engineer at Home: DME  DME Current: Agricultural consultant, Gilmer Mor  Was any abuse reported by patient?: No    Support Systems   Emergency contact: Extended Emergency Contact Information  Primary Emergency Contact: Achor,Bethany (HCPOA)   [employer] [place] [employer] Phone: [phone]  Relation: Significant other  Secondary Emergency Contact: Thompson,Carolyn   [employer] Phone: [phone]  Relation: Sister    Support Systems  Legal Status: HCPOA  Name of Guardian/POA/ Payee and Phone Number: [place] Hun / [phone]  Primary Caregiver: Family  Times of available support: Total 24/7 hands on (add comment)  Marital Status: Significant Other/Life Partner  Number of children and their names: 1 - Val Riles  Relative Search Completed: No  Demographics Correct:: Yes  Discharge Destination: Other (Comment) (TBD, pending PT/OT eval)  Next of Kin: Deniece Ree  Next of Kin Relationship: Sister  Next of Kin Phone Number: [phone]  Assessment Information Obtained From:: Significant Other (HCPOA Bethany)    Other Pertinent Information     Patient's HCPOA is his SO Lawyer [phone]). Copy is in the chart.    Discharge Plan  SW called patient's HCPOA to initiate discussion regarding discharge planning. Introduced self and role of case management/social work and provided Tour manager. Patient lives with his SO and 5yo daughter. Patient requires assistance with meal prep, driving, cooking, bathing, and med management. Patient has 24/7 supervision and assistance in the home. Patient's SO is his caregiver. Patient has been to outpatient PT/OT    Anticipated Discharge Plan: TBD, pending PT/OT  eval    Anticipated Discharge Date: 9/21 vs 9/22    Anticipated Transportation: TBD    Patient/Family aware and taking part in the discharge plan.  Patient/family educated that once post-acute care needs have been identified, a provider list applicable to the identified post-acute care needs as well as the insurance provider will be provided, and patient/family have the freedom to choose their provider(s); financial interest(s) are disclosed as appropriate.    Kathrine Haddock, MSW, [place]  [phone]

## 2021-10-18 NOTE — Unmapped (Signed)
Problem: Glucose Imbalance related to diabetes disease process  Goal: Clinical indication of glucose balance is achieved  Outcome: Progressing     Problem: Knowledge deficit related to self-management of chronic disease  Goal: Patient/family/caregiver demonstrates understanding of disease process, treatment plan, medications, and discharge instructions  Outcome: Progressing     Problem: Potential for imbalanced nutrition related to metabolic effect of diabetes  Goal: Patient's nutritional needs will be met  Outcome: Progressing

## 2021-10-18 NOTE — Unmapped (Signed)
Occupational & Physical Therapy   Reason Patient Not Seen     Name: [name]  DOB: January 26, 1956  Attending Physician: Madaline Brilliant, MD  Admission Diagnosis: Aphasia [R47.01]  Heartburn [R12]  Urgency of urination [R39.15]  Tachycardia [R00.0]  Constipation, unspecified constipation type [K59.00]  Pneumonia of both lungs due to infectious organism, unspecified part of lung [J18.9]  Depression, unspecified depression type [F32.A]  Hypertension, unspecified type [I10]  Benign prostatic hyperplasia, unspecified whether lower urinary tract symptoms present [N40.0]  Date: 10/18/2021  Precautions: Precautions: n/a  Reviewed Pertinent hospital course: Yes    Unable to see patient due to: pt adamantly refusing upon arrival w/ education on purpose/role of therapies. Pt with blanket over head and threatening verbally w/ pt putting fists up in air stating, I will do this. Pt continues to wave and state, bye bye, bye bye. Will follow-up at a later date as pt is appropriate.    Time Attempted: 1037

## 2021-10-18 NOTE — Unmapped (Signed)
Name: [name]  DOB: Aug 02, 1955  Preferred Phone Number: [phone]    PMH:   Past Medical History:   Diagnosis Date    COPD (chronic obstructive pulmonary disease) (CMS-HCC)     Diabetes mellitus (CMS-HCC)     Dysphagia     GERD (gastroesophageal reflux disease)     Hematuria     Hepatitis     Hypertension        Diagnosis: Acute Ischemic Stroke / recrudescence  Chief Complaint: aphasia    Modified Rankin Score at Discharge: 3  BP Goal: not specified   TPA/ Thrombectomy/Intervention/TICI Score: not applicable    Cardiology Consult: no  Event Monitor Order: no  Loop: no    Admit Date: 10/17/2021   Discharge Date:    Discharge Disposition: Home with 24 hour supervision/ plan is pending  Hospital Complications: N/A    LDL:   LDL Cholesterol   Date Value Ref Range Status   10/18/2021 142 mg/dL Final      N8G:   Hemoglobin A1C   Date Value Ref Range Status   10/18/2021 5.5 4.0 - 5.6 % Final     Comment:     Hemoglobin A1c Interpretation Guidelines:  Normal:     <5.7%  Prediabetes: 5.7-6.4%  Diabetes:   >6.4%  Diagnosis requires two independent tests unless clinical diagnosis is clear.     Some clinical conditions, particularly anemias and hemoglobinopathies, may interfere with the diagnostic accuracy of hemoglobin A1c.     The recommended goal for diabetic glycemic control (Hemoglobin A1c <7.0%) should be individualized based on duration of diabetes, age/life expectancy, comorbid conditions, known CVD or advanced microvascular complications, hypoglycemia unawareness, and other individual patient considerations.        ECHO: Echo Complete with Bubble Study    Result Date: 03/30/2021              [facility]                        [address]                        [place], [place] [address]                            [phone] Transthoracic Echocardiography Patient:    Tyjuan, Demetro MR #:       [id number] Account: Study Date: 03/30/2021 Gender:     M Age:        66 DOB:         [birth year] Room:       [facility]  PERFORMING   Massie Maroon, MD  SONOGRAPHER  Alma Friendly, RDCS  ORDERING     Aris Everts, [place]  ATTENDING    Phillis Haggis A ------------------------------------------------------------------- Procedure:ECHO 2D COMPLETE W/BUBBLE STUDY            Order: Accession Number:[id number] ------------------------------------------------------------------- Indications:      Stroke, embolic source (L24.40). ------------------------------------------------------------------- Risk factors:  Hypertension. Diabetes mellitus. ------------------------------------------------------------------- Study data:  Height: 72in. 182.9cm. Weight: 179.6lb. 81.6kg.  Study status:  Routine.  Procedure:  Transthoracic echocardiography. Image quality was good. Scanning was performed from the parasternal, apical, and subcostal acoustic windows. Intravenous contrast (agitated saline) was administered.          Transthoracic echocardiography.  M-mode, complete 2D, complete spectral Doppler, and color Doppler.  Birthdate:  Patient birthdate: 66-26-1957. Age:  Patient is 66yr old.  Sex:  Birth gender: male.  Body mass index:  BMI: 24.4kg/m^2.  Body surface area:    BSA: 2.57m^2. Blood pressure:     150/94  Patient status:  Inpatient.  Study date:  Study date: 03/30/2021. Study time: 01:48 PM.  Location: Bedside. ------------------------------------------------------------------- Study Conclusions - Left ventricle: The cavity size is normal. Asymmetric septal hypertrophy with basal septal   dimension in the range of 1.7 cm. Systolic function was vigorous. The estimated ejection fraction   was in the range of 65% to 70%.       Stroke Education:    Spoke with patient's significant other and provided individualized stroke education.  Discussed stroke risk factors of diabetes mellitus, hypertension, smoking, and prior stroke. Reviewed pertinent labs, provided  additional education on labs that were elevated. Reviewed patients stroke specific medications of Aspirin 81 mg a day  Plavix 75 mg a day  Atorvastatin 80mg  and importance of taking medications as prescribed.   Reviewed patient goals:  attend follow up appointments  make an appointment with PCP   check BP daily   smoking cessation     Discussed stroke specific medications.  Reviewed BEFAST & when to call 911.  Discussed following up with PCP within 1-2 weeks once home.  Reviewed follow up appointment information with Neurology. Stroke Nurse Navigator contact information provided to patient for future questions/concerns. Patient was handed appointment reminders.     Notes to Outpatient RN Care Coordinator: Patient is refusing care and to work with therapy. Unsure of patient's discharge plan at this time. Please call his SO, Bethany to confirm upcoming appointments.  Labs/tests pending at discharge: N/A      I spent approximately 30 minutes counseling the patient, reviewing their chart, and coordinating their care.  Reino Bellis    Future Appointments   Date Time Provider Department Center   10/24/2021 10:00 AM [place], [place] [facility] NEUR [facility]NI [facility]   10/24/2021 10:40 AM Ortencia Kick, RN [facility] NEUR GNI U[facility]NI   11/02/2021  9:00 AM Ernestine Mcmurray, MD [facility] PULM HOL HOL   01/11/2022  1:20 PM Myrtice Lauth, MD [facility] NEUR GNI U[facility]NI

## 2021-10-18 NOTE — Unmapped (Signed)
[facility]  Neurology Department  Daily Progress Note    Chief Complaint / Brief hospital summary     Aphasia, worsened from baseline     [name] is a 66 y.o. Black or [place] American man with PMH of L MCA strokes (2017, 02/2021, and 03/2021), HTN, DM, depression presenting with worsening of his baseline aphasia.      Prior left MCA stroke 2017 left AMA, presented again 02/2021 and 03/2021. At baseline has mild expressive aphasia. Presented today with worsening aphasia and more receptive component. Discharged on dapt x 90 days, atorva 80 to IPR. Now continued on ASA, per wife, full dose, records suggest 81. Significant other at bedside states he typically is able to understand her and string together phrases.  Additionally, patient's acute reports is usually pretty consistent.  However, she has noticed that there has been a deterioration in the past few days in the number of interchanged words. She states that she has had more difficulty understanding him, to the point where some statements are entirely unintelligible. Of note, she is able to understand the individual words.      Acute ischemic stroke due to left MCA (M2) occlusion with history of LMCA strokes  Sx: Woke up with acute onset of aphasia at approx. 0800 on 03/28/21, LKN approx. 2300 on 03/27/21. Initial NIHSS 6.  CT: Evolution of stroke in similar territory to prior from 03/18/21, L MCA.  CTA: Left M2 occlusion.  CTP: 25 mL ischemic core and 48 mL penumbra  MRI: Acute infarct of L parietal-temporal region. L M2 occlusion with abnormal flow void. Interval decrease in spotty diffusion signal in L occipital lobe (evolving subacute infarct).  TTE: LVEF 65-60%, no RWMA, G1DD, normal LA, no shunt/thrombus.  Basic stroke risk labs: A1C 5.8% (03/20/2021); TSH 1.03 (03/20/2021). LDL 116  Attempted thrombectomy 3/1 but M2 was too stenosed and wire could not be passed. Thrombectomy was not successful and stenosis could not be  reached.   Etiology: intracranial atherosclerosis     Cardiac monitoring was only about 3 days. Sinus rhythm, 6 beat v tach, PVCs and supraventricular premature complexes. No afib was detected. Per patient's caregiver, he was on DAPT for 3 months followed by just continuing Aspirin 81 mg.      No showed to two neurology appointments with Dr. Abelina Bachelor     Patient endorses worsened speech, denies vision loss, diplopia, n/t, weakness, vertigo. He has been experiencing productive cough.       Interval History / Subjective     Overnight, patient was agitated, pulled out IV, and was refusing his medications.    Review of Systems (Focused)     Review of Systems   Constitutional:  Negative for chills and fever.   Respiratory:  Positive for cough and sputum production. Negative for shortness of breath.    Cardiovascular:  Negative for chest pain and palpitations.   Gastrointestinal:  Negative for nausea and vomiting.     Medications     See MAR    Vital Signs / Intake & Output     Temp:  [97.7 F (36.5 C)-98 F (36.7 C)] 97.7 F (36.5 C)  Heart Rate:  [60-99] 94  Resp:  [12-21] 18  BP: (160-178)/(90-120) 163/92      Intake/Output Summary (Last 24 hours) at 10/18/2021 1216  Last data filed at 10/17/2021 1613  Gross per 24 hour   Intake --   Output 650 ml   Net -650 ml  Physical Exam     General: well-appearing, laying in bed, in no acute distress  HEENT: tracks with eyes, no scleral icterus. Left sub-conjunctival hemorrhage medial to the iris.  Neck: supple, full ROM.  Pulmonary: normal respiratory effort, no cyanosis.   CVS:  skin warm and well-perfused.  Abdomen:  soft, nondistended, nontender.   Skin: warm and nondiaphoretic.  Psych: Appropriate interaction.     Mental Status:   Language/Speech: No dysarthria. Follows simple commands (stick out your tongue, smile) but not complex commands (touch my finger with your right pointer finger). Requires multiple demonstrations for complex commands. Unable to correctly  name the items pictured in the NIHSS booklet. Unable to correctly name a pen. Speech is generally fluid. However, word choice when answering questions is not appropriate. When asking his own questions, he is appropriate/intelligible.  Atten/concentration: awake and alert.  Orientation: oriented to person. Unclear if oriented to time and placed. Contextually, patient seems to understand that he is in a hospital.  Memory:  Not tested secondary to expressive aphasia     Cranial Nerve:  CN II: visual acuity intact, assessed by finger counting. pupils equal, round, reactive to light with accommodation  CN III/IV/VI: EOM intact without nystagmus  CN V: facial sensation normal in V1-3  CN VII: facial activation intact and symmetric  CN VIII: hearing intact to conversation  CN IX/X: palate elevation symmetric  CN XI: shoulder shrug symmetric  CN XII: tongue protrudes midline     Motor: No atrophy  5/5 power in all 4 extremities    R L     R L           Hip Flex 5 5   Biceps 5 5   Knee Ext 5 5   Triceps 5 5   Knee Flex 5 5   Grip 5 5              Sensory:light touch intact bilaterally upper and lower extremities     Coord: no dysmetria or tremor noted     Gait:  deferred     Deep Tendon Reflexes:     R L   Biceps 2 2   Triceps 2 2   Brachioradialis 2 2   Patellar 1 1   Achilles 1 1               Laboratory Data     Reviewed in Epic     Diagnostic Studies     CT Angio Head W and or WO    Result Date: 10/17/2021  IMPRESSION: Neck 1.  Mild atherosclerotic disease with no significant carotid or vertebral artery stenosis. 2.  No dissection or pseudoaneurysm. Head 1.  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 2.  Mild left supraclinoid internal carotid artery stenosis, unchanged since prior. 3.  No large vessel occlusion. Approved by Joselyn Glassman, MD on 10/17/2021 6:47 AM EDT I have personally reviewed the images and I agree with this report. Report Verified by: Melodye Ped, MD at 10/17/2021 7:07 AM  EDT    CT Angio Neck W and or WO    Result Date: 10/17/2021  IMPRESSION: Neck 1.  Mild atherosclerotic disease with no significant carotid or vertebral artery stenosis. 2.  No dissection or pseudoaneurysm. Head 1.  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 2.  Mild left supraclinoid internal carotid artery stenosis, unchanged since prior. 3.  No large vessel occlusion. Approved by Joselyn Glassman, MD  on 10/17/2021 6:47 AM EDT I have personally reviewed the images and I agree with this report. Report Verified by: Melodye Ped, MD at 10/17/2021 7:07 AM EDT    X-ray Portable Chest    Result Date: 10/17/2021  IMPRESSION: New patchy bibasilar airspace opacities may represent atelectasis or pneumonia. Report Verified by: Melodye Ped, MD at 10/17/2021 7:10 AM EDT    Code Stroke-CT Head WO contrast    Result Date: 10/17/2021  IMPRESSION: 1.  Left parietotemporal encephalomalacia and remote basal ganglia lacunar infarcts. 2.  No intracranial mass effect or hemorrhage. Approved by Colletta Maryland, DO on 10/17/2021 6:01 AM EDT I have personally reviewed the images and I agree with this report. Report Verified by: Melodye Ped, MD at 10/17/2021 6:15 AM EDT    MRI Head limited WO contrast    Result Date: 10/17/2021  IMPRESSION: 1.  Punctate foci of acute ischemia in the left occipital subcortical region and left corona radiata. No mass effect. 2.  Moderate small vessel disease and remote infarcts as described. Report Verified by: Larey Dresser, MD at 10/17/2021 7:17 AM EDT       Assessment & Plan     WORTHINGTON CRUZAN is a 66 y.o. Black or [place] American man with PMH of L MCA strokes (2017, 02/2021, and 03/2021), HTN, DM, depression presenting with worsening of his baseline aphasia.      #Community-Acquired Pneumonia, bibasilar  Patient presented with worsening of baseline aphasia secondary to prior L MCA stroke. Concern for recrudescence prompted an infectious workup.  WBC normal at 8.0.  Chest x-ray with  patchy bibasilar airspace opacities may represent atelectasis or pneumonia.  Patient has a documented allergy to penicillins (hives).  However, he has safely received ceftriaxone in the past.  Discussed antibiotic options with pharmacist.  Given documented tolerance to ceftriaxone, proceeded with IV Ceftriaxone. However, patient pulled IV after first dose and would not allow placement of a second. Will start azithromycin 500 mg x 3 days. Repeat EKG tomorrow afternoon to prove Qtc stable.     PLAN:  -Azithromycin 500 mg PO daily (EOT 10/21/2021)     #Recrudescence, worsening of baseline aphasia  #Acute Punctate Ischemia, Left Occipital Subcortical, Left Corona Radiata  #Hx Left MCA Stroke, residual expressive aphasia  Infectious workup has been significant for a bibasilar pneumonia seen on CXR. UA was unremarkable. Wake up MRI in the ED with punctate foci of acute ischemia in the left occipital subcortical region, corona radiata.  There is also a wedge-shaped area of encephalomalacia in the left temporal/parietal lobe and left MCA territory unchanged from prior scan.  Which was associated/consistent with mild diffuse atrophy.  CTA head neck significant for severe short segment M1 stenosis, progressed from prior imaging in March.  Left supraclinoid internal carotid artery stenosis which is unchanged.  Mild atherosclerotic disease present in the neck with no significant carotid or vertebral artery stenosis. Given worsening of the M1 stenosis since prior CTA, will start patient on DAPT for 90 days per [facility]. In setting of left eye sub-conjunctival hemorrhage, contacted ophthalmology who advised that this is not a contraindication to DAPT.    rEEG was ordered and showed focal slowing over the left temporal lobe which is consistent with left temporal encephalomalacia. There was no focal, lateralizing, or epileptiform feature seen over the length of recording.     PLAN:  -ASA 325 mg load, followed by ASA 81 mg  daily  -Plavix 75 mg daily  -counseled on smoking cessation    #  Hypertension  Patient is currently on an impressive regimen of clonidine, diltiazem, hydralazine, metoprolol, and losartan at home.   - continue clonidine 0.2 mg BID  - continue metoprolol succinate 100 mg daily  - restarted patient's diltiazem 240 mg daily.   - will reassess for restarting other BP meds.     Dispo/GOC:   FLOOR  FULL CODE     COVID-19 Status:  Negative     DVT ppx: Lovenox 40 mg daily, refused this morning  GI ppx: Protonix 40 mg, refused this morning  Foley: Not indicated  Bowel Regimen: MiraLax  Fluids: Oral intake  Electrolytes: Replete PRN  Nutrition:         Diet/Nutrition Orders     Diet regular       Frequency: Effective Now       Number of Occurrences: Until Specified       Order Questions:        Suicide/Behavior Risk Modification? No        Danny Stairs, MD  Neurology PGY2  10/18/2021, 4:07 PM

## 2021-10-18 NOTE — Unmapped (Signed)
Informed patient that they were identified as a high fall risk patient and educated on the importance of a bed/chair alarm. Having an alarm placed helps to notify staff to come to room to check on patient when alerting. Patient was educated on risk for fall with possible injury and verbalize understanding but continues to refuse alarm.      [name]

## 2021-10-19 MED ORDER — diltiazem (CARDIZEM CD) 24 hr capsule 120 mg
120 | Freq: Every day | ORAL
Start: 2021-10-19 — End: 2021-10-19

## 2021-10-19 MED ORDER — diltiazem (CARDIZEM CD) 120 MG 24 hr capsule
120 | ORAL_CAPSULE | Freq: Every day | ORAL | 3 refills | Status: AC
Start: 2021-10-19 — End: ?
  Filled 2021-10-19: qty 30, 30d supply, fill #0

## 2021-10-19 MED ORDER — clopidogreL (PLAVIX) 75 mg tablet
75 | ORAL_TABLET | Freq: Every day | ORAL | 6 refills | Status: AC
Start: 2021-10-19 — End: ?
  Filled 2021-10-19: qty 30, 30d supply, fill #0

## 2021-10-19 MED ORDER — potassium chloride (KLOR-CON M20) CR tablet 40 mEq
20 | Freq: Once | ORAL | Status: AC
Start: 2021-10-19 — End: 2021-10-19
  Administered 2021-10-19: 16:00:00 40 meq via ORAL

## 2021-10-19 MED ORDER — azithromycin (ZITHROMAX) 250 MG tablet
250 | ORAL_TABLET | Freq: Every day | ORAL | 0 refills | Status: AC
Start: 2021-10-19 — End: 2021-10-21
  Filled 2021-10-19: qty 4, 2d supply, fill #0

## 2021-10-19 MED FILL — CLOPIDOGREL 75 MG TABLET: 75 75 mg | ORAL | Qty: 1

## 2021-10-19 MED FILL — CLONIDINE HCL 0.1 MG TABLET: 0.1 0.1 MG | ORAL | Qty: 2

## 2021-10-19 MED FILL — POTASSIUM CHLORIDE ER 20 MEQ TABLET,EXTENDED RELEASE(PART/CRYST): 20 20 MEQ | ORAL | Qty: 2

## 2021-10-19 MED FILL — ENOXAPARIN 40 MG/0.4 ML SUBCUTANEOUS SYRINGE: 40 40 mg/0.4 mL | SUBCUTANEOUS | Qty: 0.4

## 2021-10-19 MED FILL — PANTOPRAZOLE 40 MG TABLET,DELAYED RELEASE: 40 40 MG | ORAL | Qty: 1

## 2021-10-19 MED FILL — POLYETHYLENE GLYCOL 3350 17 GRAM ORAL POWDER PACKET: 17 17 gram | ORAL | Qty: 1

## 2021-10-19 MED FILL — ATORVASTATIN 80 MG TABLET: 80 80 MG | ORAL | Qty: 1

## 2021-10-19 MED FILL — ASPIRIN 81 MG TABLET,DELAYED RELEASE: 81 81 MG | ORAL | Qty: 1

## 2021-10-19 MED FILL — OXYBUTYNIN CHLORIDE 5 MG TABLET: 5 5 MG | ORAL | Qty: 1

## 2021-10-19 MED FILL — TAMSULOSIN 0.4 MG CAPSULE: 0.4 0.4 mg | ORAL | Qty: 1

## 2021-10-19 NOTE — Undefined (Incomplete)
Pt discharged per MD order. Pts significant other at bedside to

## 2021-10-19 NOTE — Unmapped (Signed)
[place]  Case Management/Social Work Department  Progress Note    Patient Information     Patient Name: [name]  MRN: [id number]  Hospital day: 2  Inpatient/Observation:  Inpatient   Level of Care:    Admit date:  10/17/2021  Admission diagnosis: Aphasia [R47.01]  Heartburn [R12]  Urgency of urination [R39.15]  Tachycardia [R00.0]  Constipation, unspecified constipation type [K59.00]  Pneumonia of both lungs due to infectious organism, unspecified part of lung [J18.9]  Depression, unspecified depression type [F32.A]  Hypertension, unspecified type [I10]  Benign prostatic hyperplasia, unspecified whether lower urinary tract symptoms present [N40.0]    PMH:  has a past medical history of COPD (chronic obstructive pulmonary disease) (CMS-HCC), Diabetes mellitus (CMS-HCC), Dysphagia, GERD (gastroesophageal reflux disease), Hematuria, Hepatitis, and Hypertension.    PCP:  Vevelyn Francois, DO    Home Pharmacy:    [facility] [address]  - [address]  [address] [place] [address]  Phone: [phone]     [facility]  [address]  [place] [address]  Phone: [phone]         Medical Insurance Coverage:  Payor: [employer] MANAGED MEDICARE / Plan: [facility] MEDICARE HMO COMPLETE / Product Type: Medicare Mngd Care /     Other Pertinent Information     SW completed chart review and attended interdisciplinary rounds with Neurology. No PT/OT needs. Patient is expected to be medically ready later today.     Discharge Plan     Anticipated discharge plan:  home no needs     Anticipated discharge date:  9/22     CM/SW will continue to follow and remain available for discharge planning needs.      Kathrine Haddock, MSW, [place]  [phone]

## 2021-10-19 NOTE — Unmapped (Signed)
[facility]  Department of Neurology  Discharge Summary    Patient: [name]   MRN: [id number] CSN: [id number]  Date of Admission: 10/17/2021  Date of Discharge: 10/19/2021   Attending Physician: No att. providers found     Diagnoses Present on Admission     Past Medical History:   Diagnosis Date    COPD (chronic obstructive pulmonary disease) (CMS-HCC)     Diabetes mellitus (CMS-HCC)     Dysphagia     GERD (gastroesophageal reflux disease)     Hematuria     Hepatitis     Hypertension         Discharge Diagnoses     Active Hospital Problems    Diagnosis Date Noted    Bilateral pneumonia [J18.9] 10/17/2021      Resolved Hospital Problems   No resolved problems to display.       Operations/Procedures Performed (include dates)     None    Imaging (include dates)     X-ray Portable Chest   Final Result   IMPRESSION:    New patchy bibasilar airspace opacities may represent atelectasis or pneumonia.      Report Verified by: Melodye Ped, MD at 10/17/2021 7:10 AM EDT      MRI Head limited WO contrast   Final Result   IMPRESSION:      1.  Punctate foci of acute ischemia in the left occipital subcortical region and left corona radiata. No mass effect.   2.  Moderate small vessel disease and remote infarcts as described.               Report Verified by: Larey Dresser, MD at 10/17/2021 7:17 AM EDT      Code Stroke-CT Head WO contrast   Final Result   IMPRESSION:      1.  Left parietotemporal encephalomalacia and remote basal ganglia lacunar infarcts.   2.  No intracranial mass effect or hemorrhage.         Approved by Colletta Maryland, DO on 10/17/2021 6:01 AM EDT      I have personally reviewed the images and I agree with this report.      Report Verified by: Melodye Ped, MD at 10/17/2021 6:15 AM EDT      CT Angio Neck W and or WO   Final Result   IMPRESSION:      Neck   1.  Mild atherosclerotic disease with no significant carotid or vertebral artery stenosis.   2.  No dissection or  pseudoaneurysm.      Head   1.  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   2.  Mild left supraclinoid internal carotid artery stenosis, unchanged since prior.   3.  No large vessel occlusion.      Approved by Joselyn Glassman, MD on 10/17/2021 6:47 AM EDT      I have personally reviewed the images and I agree with this report.      Report Verified by: Melodye Ped, MD at 10/17/2021 7:07 AM EDT      CT Angio Head W and or WO   Final Result   IMPRESSION:      Neck   1.  Mild atherosclerotic disease with no significant carotid or vertebral artery stenosis.   2.  No dissection or pseudoaneurysm.      Head   1.  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   2.  Mild left supraclinoid internal carotid artery stenosis, unchanged since prior.   3.  No large vessel occlusion.      Approved by Joselyn Glassman, MD on 10/17/2021 6:47 AM EDT      I have personally reviewed the images and I agree with this report.      Report Verified by: Melodye Ped, MD at 10/17/2021 7:07 AM EDT            Consulting Services (include reason)     None    Allergies   Penicillins  Discharge Medications        Medication List        TAKE these medications, which are NEW        Quantity/Refills   azithromycin 250 MG tablet  Commonly known as: ZITHROMAX  Take 2 tablets (500 mg total) by mouth daily for 2 days.   Quantity: 4 tablet  Refills: 0     clopidogreL 75 mg tablet  Commonly known as: PLAVIX  Take 1 tablet (75 mg total) by mouth daily.   Quantity: 30 tablet  Refills: 6            TAKE these medication, which have CHANGED        Quantity/Refills   diltiazem 120 MG 24 hr capsule  Commonly known as: CARDIZEM CD  Take 1 capsule (120 mg total) by mouth daily.  Start taking on: October 20, 2021  What changed:   medication strength  how much to take   Quantity: 30 capsule  Refills: 3            TAKE these medications, which you were ALREADY TAKING        Quantity/Refills   ARIPiprazole 5 MG  tablet  Commonly known as: ABILIFY  Take 1 tablet (5 mg total) by mouth daily.   Refills: 0     aspirin 81 MG chewable tablet  Chew 1 tablet (81 mg total) by mouth daily with breakfast.   Quantity: 30 tablet  Refills: 0     atorvastatin 80 MG tablet  Commonly known as: LIPITOR  Take 1 tablet (80 mg total) by mouth at bedtime.   Quantity: 30 tablet  Refills: 0     cloNIDine HCL 0.2 MG tablet  Commonly known as: CATAPRES  Take 1 tablet (0.2 mg total) by mouth 2 times a day.   Refills: 0     metoprolol succinate 100 MG 24 hr tablet  Commonly known as: TOPROL-XL  Take 1 tablet (100 mg total) by mouth daily.   Refills: 0     nicotine (polacrilex) 2 mg gum  Commonly known as: NICORETTE  Take 1 each (2 mg total) by mouth every hour as needed for Smoking cessation.   Quantity: 100 Stick  Refills: 0     nicotine 14 mg/24 hr  Commonly known as: NICODERM CQ  Place 1 patch onto the skin daily.   Quantity: 28 patch  Refills: 0     omeprazole 40 MG capsule  Commonly known as: PRILOSEC  Take 1 capsule (40 mg total) by mouth every morning before breakfast.   Refills: 0     oxybutynin 10 MG 24 hr tablet  Commonly known as: DITROPAN-XL  Take 1 tablet (10 mg total) by mouth daily.   Refills: 0     tamsulosin 0.4 mg Cap  Commonly known as: FLOMAX  Take 1 capsule (0.4 mg total) by mouth at bedtime. Take 0.4 mg by mouth daily   Quantity:  30 capsule  Refills: 3     varenicline 1 mg tablet  Commonly known as: CHANTIX  Take 1 tablet (1 mg total) by mouth 2 times a day.   Refills: 0            STOP taking these medications      hydrALAZINE 25 MG tablet  Commonly known as: APRESOLINE     losartan 50 MG tablet  Commonly known as: COZAAR               Where to Get Your Medications        These medications were sent to [facility]  [address] [place], [place] [address]      Hours: Sunday - Saturday: 8:00AM - 6:00PM Phone: [phone]   azithromycin 250 MG tablet  clopidogreL 75 mg tablet  diltiazem 120 MG 24 hr  capsule         Reason for Admission   [name] is a 66 y.o. Black or [place] American man with PMH of L MCA strokes (2017, 02/2021, and 03/2021), HTN, DM, depression presenting with worsening of his baseline aphasia.    Hospital Course     Active Hospital Problems    Diagnosis Date Noted    Bilateral pneumonia [J18.9] 10/17/2021      Resolved Hospital Problems   No resolved problems to display.      Community-Acquired Pneumonia, bibasilar, unknown organism (clinically unable to determine)   Patient presented with worsening of baseline expressive aphasia secondary to L MCA stroke (2017, 02/2021, 03/2021). He had productive cough. Worsening of prior deficit concerning for recrudescence. CXR with bibasilar opacities suspicious for pneumonia. Patient was started on ceftriaxone. He received 1 dose before pulling his own IV. Subsequently refused placement of a new  IV. Switched patient from IV Ceftriaxone to Azithromycin PO 500 mg for 3 days. He was discharged with his remaining 2 doses of azithromycin.     2. Recrudescence, worsening of baseline aphasia      Acute Punctate Ischemia, Left Occipital Subcortical, Left   Corona Radiata      Hx Left MCA Stroke, residual expressive aphasia   Infectious workup has been significant for a bibasilar pneumonia seen on CXR. UA was unremarkable. Wake up MRI in the ED with punctate foci of acute ischemia in the left occipital subcortical region, corona radiata.  There is also a wedge-shaped area of encephalomalacia in the left temporal/parietal lobe and left MCA territory unchanged from prior scan.  Which was associated/consistent with mild diffuse atrophy.  CTA head neck significant for severe short segment M1 stenosis, progressed from prior imaging in March.  Left supraclinoid internal carotid artery stenosis which is unchanged.  Mild atherosclerotic disease present in the neck with no significant carotid or vertebral artery stenosis. Given worsening of the M1 stenosis since  prior CTA, will start patient on DAPT for 90 days per [facility]. In setting of left eye sub-conjunctival hemorrhage, contacted ophthalmology who advised that this is not a contraindication to DAPT.  rEEG was ordered and showed focal slowing over the left temporal lobe which is consistent with left temporal encephalomalacia. There was no focal, lateralizing, or epileptiform feature seen over the length of recording.  Patient was discharged on Plavix 75 mg daily and ASA 81 mg daily, atorvastatin 80 mg daily. Also counseled on strict smoking cessation. He is scheduled for follow-up in stroke fellow clinic.    3. 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 Discontinued his losartan and his hydralazine  -continue clonidine 0.2 mg BID  -continue metoprolol succinate 100 mg daily  -decrease diltiazem from 240 mg to 120 mg daily    Neurologic Examination     General: well-appearing, laying in bed, in no acute distress  HEENT: tracks with eyes, no scleral icterus. Left sub-conjunctival hemorrhage medial to the iris.  Neck: supple, full ROM.  Pulmonary: normal respiratory effort, no cyanosis.   CVS:  skin warm and well-perfused.  Abdomen:  soft, nondistended, nontender.   Skin: warm and nondiaphoretic.  Psych: Appropriate interaction.     Mental Status:   Language/Speech: No dysarthria. Follows simple commands (stick out your tongue, smile) but not complex commands (touch my finger with your right pointer finger). Requires multiple demonstrations for complex commands. Unable to correctly name the items pictured in the NIHSS booklet. Unable to correctly name a pen. Speech is generally fluid. However, word choice when answering questions is not appropriate. When asking his own questions, he is appropriate/intelligible.  Atten/concentration: awake and alert.  Orientation: oriented to person. Unclear if oriented to time and placed. Contextually, patient seems to  understand that he is in a hospital.  Memory:  Not tested secondary to expressive aphasia     Cranial Nerve:  CN II: visual acuity intact, assessed by finger counting. pupils equal, round, reactive to light with accommodation  CN III/IV/VI: EOM intact without nystagmus  CN V: facial sensation normal in V1-3  CN VII: facial activation intact and symmetric  CN VIII: hearing intact to conversation  CN IX/X: palate elevation symmetric  CN XI: shoulder shrug symmetric  CN XII: tongue protrudes midline     Motor: No atrophy  5/5 power in all 4 extremities    R L     R L           Hip Flex 5 5   Biceps 5 5   Knee Ext 5 5   Triceps 5 5   Knee Flex 5 5   Grip 5 5              Sensory:light touch intact bilaterally upper and lower extremities     Coord: no dysmetria or tremor noted     Gait:  deferred     Deep Tendon Reflexes:     R L   Biceps 2 2   Triceps 2 2   Brachioradialis 2 2   Patellar 1 1   Achilles 1 1             Condition on Discharge     1. Functional Status: moderately impaired   Describe limitations, if any: profound expressive aphasia   PT Interventions and Frequency:     PT Recommendations:     OT Interventions and Frequency:     OT Recommendations:    2. Mental Status: Alert/Oriented   Describe limitations, if any: Patient is alert but orientation is not assessable secondary to expressive aphasia  3. Dietary Restrictions / Tube Feeding / TPN: No   If yes, describe:    Specific speech therapy recommendations?   4. Supplemental Oxygen / Ventilation / CPAP / Bi-Level: No   If yes, describe settings/delivery:    Change from baseline?: N/A  5. In-dwelling Lines or Catheters: No   If yes, describe:     Disposition     Home independent    Follow-Up Appointments     Future Appointments   Date  Time Provider Department Center   10/24/2021 10:00 AM Paoli, CNP [facility] NEUR GNI [facility]   10/24/2021 10:40 AM Ortencia Kick, RN [facility] NEUR GNI [facility]   11/02/2021  9:00 AM Ernestine Mcmurray, MD [facility] PULM HOL HOL   01/11/2022  1:20  PM Myrtice Lauth, MD [facility] NEUR [facility]       Specific Follow-Up Items for Receiving Physician   NURSING HOME/ INPATIENT REHAB FACILITY: PLEASE CALL [facility] AFTER PATIENT IS DISCHARGED FROM YOUR FACILITY AT [phone]         Junior Resident Physician: Nicholes Stairs     Senior Resident Physician: Kayren Eaves, MD  Neurology PGY2  10/19/2021, 3:43 PM

## 2021-10-19 NOTE — Unmapped (Signed)
Physical and Occupational Therapy   Reason Patient Not Seen & Discharge    Name: [name]  DOB: September 30, 1955  Attending Physician: Madaline Brilliant, MD  Admission Diagnosis: Aphasia [R47.01]  Heartburn [R12]  Urgency of urination [R39.15]  Tachycardia [R00.0]  Constipation, unspecified constipation type [K59.00]  Pneumonia of both lungs due to infectious organism, unspecified part of lung [J18.9]  Depression, unspecified depression type [F32.A]  Hypertension, unspecified type [I10]  Benign prostatic hyperplasia, unspecified whether lower urinary tract symptoms present [N40.0]  Date: 10/19/2021  Precautions: Precautions: n/a  Reviewed Pertinent hospital course: Yes    Unable to see patient due to: pt seen ambulating independently around room eating breakfast. Pt reports no concerns with returning home upon discharge. Pt asked therapists to leave room upon further questioning about home setup/equipment at home. Pt will be discharged from therapy caseload as no further acute therapy needs identified.    Time Attempted: 0906

## 2021-10-19 NOTE — Unmapped (Signed)
Problem: Glucose Imbalance related to diabetes disease process  Goal: Clinical indication of glucose balance is achieved  Outcome: Progressing  Goal: Patient's discharge needs are met  Outcome: Progressing     Problem: Knowledge deficit related to self-management of chronic disease  Goal: Patient/family/caregiver demonstrates understanding of disease process, treatment plan, medications, and discharge instructions  Outcome: Progressing     Problem: Potential for imbalanced nutrition related to metabolic effect of diabetes  Goal: Patient's nutritional needs will be met  Outcome: Progressing     Problem: Risk for ineffective therapeutic regimen management related to insulin pump  Goal: Blood glucose is within target range  Outcome: Progressing

## 2021-10-22 NOTE — Unmapped (Signed)
[facility] 2 Day Follow Up Phone Call    Discharge Date: 10/19/21  Date of Call: 10/22/21        Medication List            Accurate as of October 22, 2021  1:17 PM. If you have any questions, ask your nurse or doctor.                TAKE these medications, which you were ALREADY TAKING        Quantity/Refills   ARIPiprazole 5 MG tablet  Commonly known as: ABILIFY  Take 1 tablet (5 mg total) by mouth daily.   Refills: 0     aspirin 81 MG chewable tablet  Chew 1 tablet (81 mg total) by mouth daily with breakfast.   Quantity: 30 tablet  Refills: 0     atorvastatin 80 MG tablet  Commonly known as: LIPITOR  Take 1 tablet (80 mg total) by mouth at bedtime.   Quantity: 30 tablet  Refills: 0     cloNIDine HCL 0.2 MG tablet  Commonly known as: CATAPRES  Take 1 tablet (0.2 mg total) by mouth 2 times a day.   Refills: 0     clopidogreL 75 mg tablet  Commonly known as: PLAVIX  Take 1 tablet (75 mg total) by mouth daily.   Quantity: 30 tablet  Refills: 6     diltiazem 120 MG 24 hr capsule  Commonly known as: CARDIZEM CD  Take 1 capsule (120 mg total) by mouth daily.   Quantity: 30 capsule  Refills: 3     metoprolol succinate 100 MG 24 hr tablet  Commonly known as: TOPROL-XL  Take 1 tablet (100 mg total) by mouth daily.   Refills: 0     nicotine (polacrilex) 2 mg gum  Commonly known as: NICORETTE  Take 1 each (2 mg total) by mouth every hour as needed for Smoking cessation.   Quantity: 100 Stick  Refills: 0     nicotine 14 mg/24 hr  Commonly known as: NICODERM CQ  Place 1 patch onto the skin daily.   Quantity: 28 patch  Refills: 0     omeprazole 40 MG capsule  Commonly known as: PRILOSEC  Take 1 capsule (40 mg total) by mouth every morning before breakfast.   Refills: 0     oxybutynin 10 MG 24 hr tablet  Commonly known as: DITROPAN-XL  Take 1 tablet (10 mg total) by mouth daily.   Refills: 0     tamsulosin 0.4 mg Cap  Commonly known as: FLOMAX  Take 1 capsule (0.4 mg total) by mouth at bedtime. Take 0.4 mg  by mouth daily   Quantity: 30 capsule  Refills: 3     varenicline 1 mg tablet  Commonly known as: CHANTIX  Take 1 tablet (1 mg total) by mouth 2 times a day.   Refills: 0              Spoke with patient as follow up to recent stroke hospitalization.  Verified patient taking medications as prescribed. Ensured they are not having side effects.    Reviewed upcoming appointments:  Hospital Discharge Follow Up with the [facility] and Dr. Lucretia Field  PCP Follow Up: 10/23/21    Patient concerns: none      - Encouraged patient to monitor BP daily     Referrals: SLP  Contact numbers for referrals given    Follow up testing: none  Reviewed BEFAST and when to call 911.   Patient given clinic number [phone]) for future questions/concerns.    Future Appointments   Date Time Provider Department Center   10/24/2021 10:00 AM [place], CNP [facility] NEUR GNI [facility]   10/24/2021 10:40 AM Ortencia Kick, RN [facility] NEUR GNI [facility]   11/02/2021  9:00 AM Ernestine Mcmurray, MD [facility] PULM HOL HOL   01/11/2022  1:20 PM Myrtice Lauth, MD [facility] NEUR GNI U[facility]

## 2021-10-23 ENCOUNTER — Inpatient Hospital Stay
Admit: 2021-10-23 | Discharge: 2021-10-24 | Disposition: A | Discharge status: Home / Self Care | Payer: Medicare (Managed Care)

## 2021-10-23 ENCOUNTER — Emergency Department: Admit: 2021-10-23 | Payer: Medicare (Managed Care)

## 2021-10-23 ENCOUNTER — Emergency Department: Payer: Medicare (Managed Care)

## 2021-10-23 DIAGNOSIS — R002 Palpitations: Secondary | ICD-10-CM

## 2021-10-23 LAB — CBC
Hematocrit: 36.2 % — ABNORMAL LOW (ref 38.5–50.0)
Hemoglobin: 11.9 g/dL — ABNORMAL LOW (ref 13.2–17.1)
MCH: 25.1 pg — ABNORMAL LOW (ref 27.0–33.0)
MCHC: 32.8 g/dL (ref 32.0–36.0)
MCV: 76.5 fL — ABNORMAL LOW (ref 80.0–100.0)
MPV: 8.8 fL (ref 7.5–11.5)
Platelets: 266 10*3/uL (ref 140–400)
RBC: 4.72 10*6/uL (ref 4.20–5.80)
RDW: 22.7 % — ABNORMAL HIGH (ref 11.0–15.0)
WBC: 8.9 10*3/uL (ref 3.8–10.8)

## 2021-10-23 LAB — ED BLOOD GAS PANEL, VENOUS
%HBO2, Venous: 52.8 % (ref 40.0–70.0)
Base Excess, Ven: 2.3 mmol/L (ref <=3.0)
CO2 Content, Venous: 29 mmol/L (ref 25–29)
Carboxyhemoglobin, Venous: 4.8 % (ref 0.0–2.0)
Free Calcium, WB: 4.76 mg/dL (ref 4.50–5.30)
Glucose: 97 mg/dL (ref 70–100)
HCO3, Ven: 28 mmol/L (ref 24–28)
Hematocrit. Blood Gas Panel: 37.5 % (ref 40–52)
Hemoglobin, Blood Gas Panel: 12.2 g/dL (ref 14.0–18.0)
Lactate, Ven: 1.5 mmol/L (ref 0.5–1.6)
Methemoglobin, Venous: 1 % (ref 0.0–1.5)
Potassium: 4.2 mEq/L (ref 3.5–5.3)
Reduced hemoglobin, Venous: 41.4 % (ref 0.0–5.0)
Sodium: 141 mEq/L (ref 136–146)
pCO2, Ven: 44 mm Hg (ref 41–51)
pH, Ven: 7.4 (ref 7.32–7.42)
pO2, Ven: 31 mm Hg (ref 25–40)

## 2021-10-23 LAB — DIFFERENTIAL
Basophils Absolute: 89 /uL (ref 0–200)
Basophils Relative: 1 % (ref 0.0–1.0)
Eosinophils Absolute: 169 /uL (ref 15–500)
Eosinophils Relative: 1.9 % (ref 0.0–8.0)
Lymphocytes Absolute: 2501 /uL (ref 850–3900)
Lymphocytes Relative: 28.1 % (ref 15.0–45.0)
Monocytes Absolute: 721 /uL (ref 200–950)
Monocytes Relative: 8.1 % (ref 0.0–12.0)
Neutrophils Absolute: 5420 /uL (ref 1500–7800)
Neutrophils Relative: 60.9 % (ref 40.0–80.0)
nRBC: 0 /100 WBC (ref 0–0)

## 2021-10-23 LAB — BASIC METABOLIC PANEL
Anion Gap: 7 mmol/L (ref 3–16)
BUN: 19 mg/dL (ref 7–25)
CO2: 26 mmol/L (ref 21–33)
Calcium: 9.5 mg/dL (ref 8.6–10.3)
Chloride: 106 mmol/L (ref 98–110)
Creatinine: 1.32 mg/dL — ABNORMAL HIGH (ref 0.60–1.30)
EGFR: 59
Glucose: 97 mg/dL (ref 70–100)
Osmolality, Calculated: 290 mosm/kg (ref 278–305)
Potassium: 3.7 mmol/L (ref 3.5–5.3)
Sodium: 139 mmol/L (ref 133–146)

## 2021-10-23 LAB — DDIMER: D-Dimer: 0.5 ug/mL FEU (ref 0.00–0.50)

## 2021-10-23 LAB — HIGH SENSITIVITY TROPONIN
High Sensitivity Troponin: 13 ng/L (ref 0–20)
High Sensitivity Troponin: 14 ng/L (ref 0–20)

## 2021-10-23 LAB — MAGNESIUM: Magnesium: 1.8 mg/dL (ref 1.5–2.5)

## 2021-10-23 LAB — B NATRIURETIC PEPTIDE: BNP: 36 pg/mL (ref 0–100)

## 2021-10-23 MED ORDER — OMNIPAQUE (iohexol) 350 mg iodine/mL 100 mL
350 | Freq: Once | INTRAVENOUS | Status: AC | PRN
Start: 2021-10-23 — End: 2021-10-23
  Administered 2021-10-23: 23:00:00 100 mL via INTRAVENOUS

## 2021-10-23 MED ORDER — ondansetron (ZOFRAN) injection 4 mg
4 | Freq: Once | INTRAMUSCULAR | Status: AC
Start: 2021-10-23 — End: 2021-10-23
  Administered 2021-10-23: 22:00:00 4 mg via INTRAVENOUS

## 2021-10-23 MED ORDER — lactated Ringers 1,000 mL IV fluid
Freq: Once | INTRAVENOUS | Status: AC
Start: 2021-10-23 — End: 2021-10-23
  Administered 2021-10-23: 22:00:00 1000 mL via INTRAVENOUS

## 2021-10-23 MED ORDER — sodium chloride 0.9 % infusion
INTRAVENOUS
Start: 2021-10-23 — End: 2021-10-24

## 2021-10-23 MED FILL — SODIUM CHLORIDE 0.9 % INTRAVENOUS SOLUTION: INTRAVENOUS | Qty: 50

## 2021-10-23 MED FILL — ONDANSETRON HCL (PF) 4 MG/2 ML INJECTION SOLUTION: 4 4 mg/2 mL | INTRAMUSCULAR | Qty: 2

## 2021-10-23 MED FILL — LACTATED RINGERS INTRAVENOUS SOLUTION: 1000.00 1000.00 mL | INTRAVENOUS | Qty: 1000

## 2021-10-23 NOTE — Unmapped (Signed)
Pt was recent d/c of a stroke on 10/20/21. Pt was at his follow up apt and sent to the ER for irregular heart rate, left arm pain and high blood pressure

## 2021-10-23 NOTE — Unmapped (Addendum)
Return to emergency department if you develop chest pain, new palpitations, shortness of breath, unable to walk, abdominal pain, vomiting    Follow up with primary care doctor and neurologist to discuss medications

## 2021-10-23 NOTE — Unmapped (Signed)
Pt's and pt's family member educated on discharge instructions. Educated on maintaining current medication regimen, and follow up care. Pt and pt's family member verbalized understanding. Pt ambulatory at time of discharge.

## 2021-10-23 NOTE — Unmapped (Signed)
Bed: B09W  Expected date:   Expected time:   Means of arrival:   Comments:  [facility]

## 2021-10-23 NOTE — Unmapped (Signed)
[facility] Emergency Department  MEDICAL SCREENING EXAM    Date of Service: 10/23/2021    Reason for Visit: Palpitations, Arm Pain, and Hypertension      Abbreviated Patient History     HPI: [name] is a 66 y.o. male presenting with history of remote stroke and recently diagnosed stroke at hospitalization [facility] on 10/17/2021 who presents emergency department with palpitations, left arm pain and hypertension while at PCP office today.  The patient and significant other at bedside states that he has not been right since he was discharged from the hospital 3 days ago.  They state that he has had tachycardia, hypertension, pain in his left arm since discharge.  The patient endorses dyspnea on exertion.  Additionally they state that the patient has not been ambulating as usual and seems to be favoring the right side for the past 3 days.  No fevers, chills, abdominal pain, nausea/vomiting/diarrhea.  No problems urinating.  Eating normally.    He was also diagnosed with pneumonia in the hospital and given antibiotics which sounds a Z-Pak.  Patient is on dual antiplatelet therapy for 90 days.    Past Medical History:   Diagnosis Date    COPD (chronic obstructive pulmonary disease) (CMS-HCC)     Diabetes mellitus (CMS-HCC)     Dysphagia     GERD (gastroesophageal reflux disease)     Hematuria     Hepatitis     Hypertension        Past Surgical History:   Procedure Laterality Date    ESOPHAGOGASTRODUODENOSCOPY N/A 11/19/2013    Procedure: ESOPHAGOGASTRODUODENOSCOPY WITH MAC;  Surgeon: Charlane Ferretti, MD;  Location: [facility] ENDOSCOPY;  Service: Gastroenterology;  Laterality: N/A;    FOOT SURGERY Left     HERNIA REPAIR      about 5 yrs ago        No current facility-administered medications for this encounter.    Current Outpatient Medications:     ARIPiprazole (ABILIFY) 5 MG tablet, Take 1 tablet (5 mg total) by mouth daily., Disp: , Rfl:     aspirin 81 MG chewable tablet, Chew 1 tablet (81 mg total) by mouth daily  with breakfast., Disp: 30 tablet, Rfl: 0    atorvastatin (LIPITOR) 80 MG tablet, Take 1 tablet (80 mg total) by mouth at bedtime., Disp: 30 tablet, Rfl: 0    cloNIDine HCL (CATAPRES) 0.2 MG tablet, Take 1 tablet (0.2 mg total) by mouth 2 times a day., Disp: , Rfl:     clopidogreL (PLAVIX) 75 mg tablet, Take 1 tablet (75 mg total) by mouth daily., Disp: 30 tablet, Rfl: 6    diltiazem (CARDIZEM CD) 120 MG 24 hr capsule, Take 1 capsule (120 mg total) by mouth daily., Disp: 30 capsule, Rfl: 3    metoprolol succinate (TOPROL-XL) 100 MG 24 hr tablet, Take 1 tablet (100 mg total) by mouth daily., Disp: , Rfl:     nicotine (NICODERM CQ) 14 mg/24 hr, Place 1 patch onto the skin daily., Disp: 28 patch, Rfl: 0    nicotine, polacrilex, (NICORETTE) 2 mg gum, Take 1 each (2 mg total) by mouth every hour as needed for Smoking cessation., Disp: 100 Stick, Rfl: 0    omeprazole (PRILOSEC) 40 MG capsule, Take 1 capsule (40 mg total) by mouth every morning before breakfast., Disp: , Rfl:     oxybutynin (DITROPAN-XL) 10 MG 24 hr tablet, Take 1 tablet (10 mg total) by mouth daily., Disp: , Rfl:  tamsulosin (FLOMAX) 0.4 mg Cap, Take 1 capsule (0.4 mg total) by mouth at bedtime. Take 0.4 mg by mouth daily, Disp: 30 capsule, Rfl: 3    varenicline (CHANTIX) 1 mg tablet, Take 1 tablet (1 mg total) by mouth 2 times a day., Disp: , Rfl:     Allergies   Allergen Reactions    Penicillins Hives     Tolerates cephalosporins (cephalexin, cefdinir, ceftriaxone)     Brief ROS     Pertinent positive and negative findings as documented above in HPI    Targeted Physical Exam     ED Triage Vitals [10/23/21 1445]   Enc Vitals Group      BP (!) 133/91      Heart Rate 110      Resp 20      Temp 97.5 F (36.4 C)      Temp Source Oral      SpO2 100 %      Weight       Height       Head Circumference       Peak Flow       Pain Score       Pain Loc       Pain Edu?       Excl. in GC?        General:  Well appearing. No acute distress  Head: Atraumatic.  Normocephalic  Eyes:  Sclera anicteric. No conjunctival injection  ENT:  Mucous membranes moist  Neck/Back: Neck supple. No gross deformities  Chest/Pulmonary:   No respiratory distress  Cardiac:  Regular rate  Vascular:  Extremities warm and perfused  Extremities:  No peripheral edema. No deformities  Abdomen: Non-distended  Neuro: Alert and oriented x 3.  Aphasia present and at patient's baseline.  5/5 strength in the right upper and right lower extremities.  4/5 strength in the left upper extremity when compared to right.  Psych: Normal affect and mood  Skin:  No rash. Well perfused    Plan     Patient evaluated from the lobby for a medical screening exam.  In short, this is a patient presenting with palpitations, chest pain.     To further evaluate, the following orders have been placed:    Labs Reviewed   CBC   DIFFERENTIAL   BASIC METABOLIC PANEL   HIGH SENSITIVITY TROPONIN   B NATRIURETIC PEPTIDE   ED ECG 12-LEAD (MUSE)     X-ray Chest PA and Lateral   Final Result   IMPRESSION:      1.  No acute cardiopulmonary process.       Report Verified by: Arville Care, MD at 10/23/2021 3:50 PM EDT        See primary provider's note for full details and final disposition.     Dispo       Based on the patient's presentation, they have been identified as needing a bed urgently. The charge nurse has been notified.     [facility]

## 2021-10-23 NOTE — Unmapped (Signed)
*[place] is currently under a State of Emergency for the COVID-19 Pandemic*                                                        [facility] ED Note    Date of Service:  10/23/2021    Reason for Visit: Palpitations, Arm Pain, and Hypertension      Patient History     HPI: 66 year old male with past medical history of hypertension, diabetes, COPD, L MCA stroke (2017, 02/2021, 03/2021), smoker, recently admitted for pneumonia and worsening baseline aphasia,  presenting today for concerns of palpitations. Patient currently on DAPT.     Was in his primary care doctor today, told his HR was elevated and his blood pressure was elevated and to come in.  Patient tells me that he overall feels okay, has been eating and drinking fluids without any problem.  He is a little bit wobbly while walking, but was cleared to be able to walk without assistance by physical therapy while he was inpatient.  He denies any headaches.  States that he feels slightly tired with mild nausea, no abdominal pain.  No chest pain.  States he feels slight shortness of breath.    Patient continues to smoke.       Past Medical History:   Diagnosis Date    COPD (chronic obstructive pulmonary disease) (CMS-HCC)     Diabetes mellitus (CMS-HCC)     Dysphagia     GERD (gastroesophageal reflux disease)     Hematuria     Hepatitis     Hypertension        Past Surgical History:   Procedure Laterality Date    ESOPHAGOGASTRODUODENOSCOPY N/A 11/19/2013    Procedure: ESOPHAGOGASTRODUODENOSCOPY WITH MAC;  Surgeon: Charlane Ferretti, MD;  Location: [facility] ENDOSCOPY;  Service: Gastroenterology;  Laterality: N/A;    FOOT SURGERY Left     HERNIA REPAIR      about 5 yrs ago       TRAVARIUS LANGE  reports that he has been smoking cigarettes. He has a 1.25 pack-year smoking history. He has never used smokeless tobacco. He reports that he does not drink alcohol and does not use drugs.    Discharge Medication List as of 10/23/2021 10:01 PM        CONTINUE these medications which  have NOT CHANGED    Details   ARIPiprazole (ABILIFY) 5 MG tablet Take 1 tablet (5 mg total) by mouth daily., Historical Med      aspirin 81 MG chewable tablet Chew 1 tablet (81 mg total) by mouth daily with breakfast., Starting Fri 04/06/2021, No Print, Disp-30 tablet, R-0      atorvastatin (LIPITOR) 80 MG tablet Take 1 tablet (80 mg total) by mouth at bedtime., Starting 07/31/2015, Until Discontinued, Print      cloNIDine HCL (CATAPRES) 0.2 MG tablet Take 1 tablet (0.2 mg total) by mouth 2 times a day., Historical Med      clopidogreL (PLAVIX) 75 mg tablet Take 1 tablet (75 mg total) by mouth daily., Starting Fri 10/19/2021, Normal, Disp-30 tablet, R-6      diltiazem (CARDIZEM CD) 120 MG 24 hr capsule Take 1 capsule (120 mg total) by mouth daily., Starting Sat 10/20/2021, Normal, Disp-30 capsule, R-3      metoprolol succinate (TOPROL-XL)  100 MG 24 hr tablet Take 1 tablet (100 mg total) by mouth daily., Historical Med      nicotine (NICODERM CQ) 14 mg/24 hr Place 1 patch onto the skin daily., Starting Fri 04/06/2021, No Print, Disp-28 patch, R-0      nicotine, polacrilex, (NICORETTE) 2 mg gum Take 1 each (2 mg total) by mouth every hour as needed for Smoking cessation., Starting Fri 04/06/2021, No Print, Disp-100 Stick, R-0      omeprazole (PRILOSEC) 40 MG capsule Take 1 capsule (40 mg total) by mouth every morning before breakfast., Historical Med      oxybutynin (DITROPAN-XL) 10 MG 24 hr tablet Take 1 tablet (10 mg total) by mouth daily., Historical Med      tamsulosin (FLOMAX) 0.4 mg Cap Take 1 capsule (0.4 mg total) by mouth at bedtime. Take 0.4 mg by mouth daily, Starting Wed 01/05/2020, Normal, Disp-30 capsule, R-3      varenicline (CHANTIX) 1 mg tablet Take 1 tablet (1 mg total) by mouth 2 times a day., Historical Med             Allergies:   Allergies as of 10/23/2021 - Fully Reviewed 10/23/2021   Allergen Reaction Noted    Penicillins Hives 04/22/2013       Review of Systems     ROS:  A complete review of systems  was performed. It is positive as noted in HPI. All systems were reviewed and reported as negative unless otherwise noted.      Physical Exam     Vitals:    10/23/21 2015 10/23/21 2056 10/23/21 2150 10/23/21 2159   BP: 154/67 137/82 (!) 147/91    BP Location:       Patient Position:       BP Cuff Size:       Pulse: 116 98 92 83   Resp: 19 17 20 21    Temp:       TempSrc:       SpO2: 100% 100% 97% 100%       General: Awake, alert  HEENT:  Normocephalic, atraumatic. Pupils are equal, round and reactive to light. Oral mucous membranes were moist with no oropharyngeal erythema or exudate.  Neck:  Supple with full range of motion. No JVD.  Pulmonary:   Lungs are clear to auscultation bilaterally, easy work of breathing. Chest wall is nontender.  Cardiovascular:  Regular rate and rhythm. No murmurs, rubs or gallops. 2+ pulses throughout. Capillary refill less than 2 seconds.  Abdomen:  Soft, nontender, nondistended. No focal masses. No rebound or guarding.  GU: No CVA tenderness.  Musculoskeletal:  Extremities are warm and well-perfused. No peripheral edema. No cords noted.  Skin:  Warm and dry.  Neuro:  AOx4. CN II-XII are grossly intact except for expressive aphasia and right facial droop. Sensation is grossly intact throughout. Moves all four extremities with no obvious drift.  Able to sit up, stand up, gait is slightly slow but steady.  Negative Romberg.       Diagnostic Studies     Labs:  Labs Reviewed   CBC - Abnormal; Notable for the following components:       Result Value    Hemoglobin 11.9 (*)     Hematocrit 36.2 (*)     MCV 76.5 (*)     MCH 25.1 (*)     RDW 22.7 (*)     All other components within normal limits   BASIC METABOLIC PANEL - Abnormal; Notable for  the following components:    Creatinine 1.32 (*)     All other components within normal limits   ED BLOOD GAS PANEL, VENOUS - Abnormal; Notable for the following components:    Hematocrit. Blood Gas Panel 37.5 (*)     Hemoglobin, Blood Gas Panel 12.2 (*)      Carboxyhemoglobin, Venous 4.8 (*)     Reduced hemoglobin, Venous 41.4 (*)     All other components within normal limits   DIFFERENTIAL   HIGH SENSITIVITY TROPONIN   B NATRIURETIC PEPTIDE    Narrative:     The presence of high concentrations of biotin may cause falsely lowered BNP results. Biotin interference may be seen if an individual is taking >5 mg biotin per day. Interpret BNP results in the context of the patient's clinical presentation.   MAGNESIUM   HIGH SENSITIVITY TROPONIN    Narrative:     Please draw 60 minutes after the time the first troponin is drawn.   DDIMER   ED ECG 12-LEAD (MUSE)   ED ECG 12-LEAD (MUSE)        Radiology:  CT Pulmonary Angiography   Final Result   IMPRESSION:      1.  No acute pulmonary embolism or other acute intrathoracic abnormality.   2.  Incidental findings as above.         Approved by Raynelle Highland, MD on 10/23/2021 7:14 PM EDT      I have personally reviewed the images and I agree with this report.      Report Verified by: Ezzie Dural, DO at 10/23/2021 9:44 PM EDT      X-ray Chest PA and Lateral   Final Result   IMPRESSION:      1.  No acute cardiopulmonary process.       Report Verified by: Arville Care, MD at 10/23/2021 3:50 PM EDT           EKG:  Sinus arrhythmia at a rate of 98 bpm, no ST elevations.  Nonspecific ST and T wave abnormality.  Intervals within normal limits.  Abnormal.    Emergency Department Procedures     None    ED Course and MDM     [name] is a 65 y.o. male who presented to the emergency department with concerns of tachycardia, mild shortness of breath.  On exam, overall well-appearing.  EKG without signs of ischemia, he has sinus arrhythmia present.  Troponin 13, 14.  Normal BNP.  CBC with microcytic anemia at baseline.  Able to ambulate without any assistance, although his gait is slightly slower.    ED Course as of 10/24/21 0011   Tue Oct 23, 2021   1758 X-ray Chest PA and Lateral   [phone] Patient persistently tachycardic, mild SOB. No  respiratory distress on my examination. D-dimer borderline. CTPA ordered.    2154 CTPA without PE, well appearing sleeping comfortably. NSR at HR of 81 while sleeping. No respiratory distress. He has an appointment with neurology tomorrow and I advised both him and partner to discuss medication reconciliation given unclear whether patient was discharged to take only diltiazem or if he's also supposed to be on metoprolol. His blood work is stable, I do not have concerns for ACS or other abnormal life threatening arrhythmia. Discharged home with strict return precautions given.     At this time, patient is safe for discharge home.  He has remained hemodynamically stable here.  Patient counseled to follow up with primary care  physician within next week and strict return precautions given.    Clinical Impression:  Tachycardia, resolved    Disposition/Plan: Home      Yarianna Varble D. Cherly Hensen, MD  Attending Physician  Clinical Instructor of Emergency Medicine  [facility]       [place] D. Cherly Hensen, MD  10/24/21 [phone]

## 2021-10-24 ENCOUNTER — Encounter

## 2021-10-25 NOTE — Unmapped (Signed)
Patient missed his appts with 2 Providers on the same day, but I noticed he was admitted to the ED therefore these NS do not count against him.

## 2021-11-02 ENCOUNTER — Ambulatory Visit: Payer: Medicare (Managed Care) | Attending: Pulmonary Disease

## 2021-11-06 ENCOUNTER — Ambulatory Visit
Admission: EM | Admit: 2021-11-06 | Discharge: 2021-11-06 | Disposition: A | Discharge status: Home / Self Care | Payer: No Typology Code available for payment source | Attending: Nurse Practitioner | Admitting: Nurse Practitioner

## 2021-11-06 DIAGNOSIS — H66001 Acute suppurative otitis media without spontaneous rupture of ear drum, right ear: Secondary | ICD-10-CM

## 2021-11-06 MED ORDER — AMOXICILLIN-POT CLAVULANATE 875-125 MG PO TABS: 1.0000 | ORAL_TABLET | Freq: Two times a day (BID) | ORAL | 0 refills | Status: DC

## 2021-11-06 NOTE — Discharge Instructions (Signed)
Take medication as prescribed.  As discussed, start using the Flonase you have at home. May take over-the-counter ibuprofen or Tylenol as needed for pain, fever, or general discomfort. Warm compresses to the affected ear help with comfort. Do not stick anything inside the ear while symptoms persist.  Avoid using your earplugs when possible. Avoid getting water inside of the ear while symptoms persist. Follow-up with your primary care physician if symptoms fail to improve after completing this treatment regimen.

## 2021-11-06 NOTE — ED Provider Notes (Signed)
[facility]    CSN: [id number] Arrival date & time: 11/06/21  1753      History   Chief Complaint Chief Complaint  Patient presents with   Otalgia    HPI Daniel [name] is a 66 y.o. male.   The history is provided by the patient.   Patient presents for complaints of right ear pain and nasal congestion that started today.  Patient denies fever, chills, headache, cough, chest pain, abdominal pain, nausea, vomiting, or diarrhea.  Patient states that he wears earplugs when at work and then when he gets home he has to wear a hearing aid.  He has not taken any medication for his symptoms.  Past Medical History:  Diagnosis Date   COPD (chronic obstructive pulmonary disease) ([place])    Hypertension     Patient Active Problem List   Diagnosis Date Noted   Hematochezia 04/02/2017   Heme + stool 04/02/2017   Rotator cuff tear 04/05/2013   Shoulder dislocation 04/05/2013   Acute respiratory failure ([place]) 12/22/2012   Essential hypertension, benign 12/22/2012   COPD exacerbation ([place]) 12/21/2012   Hypertriglyceridemia without hypercholesterolemia 03/12/2011   Chest pain 03/06/2011   Anxiety 03/06/2011   Obesity (BMI 30-39.9) 03/06/2011   Pre-diabetes 03/06/2011    Past Surgical History:  Procedure Laterality Date   APPENDECTOMY     COLONOSCOPY N/A 04/24/2017   Procedure: COLONOSCOPY;  Surgeon: Danie Binder, MD;  Location: [facility];  Service: Endoscopy;  Laterality: N/A;  1:00pm   HEMORRHOID SURGERY     HERNIA REPAIR     RLQ, umbilical       Home Medications    Prior to Admission medications   Medication Sig Start Date End Date Taking? Authorizing Provider  amoxicillin-clavulanate (AUGMENTIN) 875-125 MG tablet Take 1 tablet by mouth every 12 (twelve) hours. 11/06/21  Yes Sunil Hue-Warren, Alda Lea, NP  acetaminophen (TYLENOL) 500 MG tablet Take 1,000 mg by mouth daily as needed for moderate pain or headache.    [provider]  albuterol  (PROVENTIL HFA;VENTOLIN HFA) 108 (90 BASE) MCG/ACT inhaler Inhale 2 puffs into the lungs every 4 (four) hours as needed for wheezing or shortness of breath. 12/23/12   Samuella Cota, MD  albuterol (PROVENTIL) (2.5 MG/3ML) 0.083% nebulizer solution Take 2.5 mg by nebulization every 6 (six) hours as needed for wheezing or shortness of breath.    [provider]  albuterol (PROVENTIL) 4 MG tablet Take 4 mg by mouth 4 (four) times daily.    [provider]  amLODipine (NORVASC) 5 MG tablet Take 5 mg by mouth daily.    [provider]  cetirizine (ZYRTEC) 10 MG tablet Take 1 tablet (10 mg total) by mouth daily. 06/27/21   Kenetha Cozza-Warren, Alda Lea, NP  enalapril (VASOTEC) 5 MG tablet Take 10 mg by mouth 2 (two) times daily.     [provider]  fluticasone (FLONASE) 50 MCG/ACT nasal spray Place 2 sprays into both nostrils daily. 06/27/21   Zyler Hyson-Warren, Alda Lea, NP  furosemide (LASIX) 20 MG tablet Take 20 mg by mouth daily as needed for edema.  03/06/13   [provider]  Glucosamine-Chondroitin (OSTEO BI-FLEX REGULAR STRENGTH PO) Take 1 tablet by mouth daily.    [provider]  ibuprofen (ADVIL,MOTRIN) 200 MG tablet Take 400 mg by mouth daily as needed for headache or moderate pain.    [provider]  ipratropium (ATROVENT) 0.03 % nasal spray Place 2 sprays into both nostrils 2 (two)  times daily. 06/18/20   Scot Jun, FNP  levocetirizine (XYZAL) 5 MG tablet Take 1 tablet (5 mg total) by mouth every evening. 06/18/20   Scot Jun, FNP  Multiple Vitamin (MULITIVITAMIN WITH MINERALS) TABS Take 1 tablet by mouth daily.    [provider]  promethazine-dextromethorphan (PROMETHAZINE-DM) 6.25-15 MG/5ML syrup Take 5 mLs by mouth 3 (three) times daily as needed for cough. 06/18/20   Scot Jun, FNP  Tamsulosin HCl (FLOMAX) 0.4 MG CAPS Take 0.4 mg by mouth at bedtime.     [provider]    Family  History Family History  Problem Relation Age of Onset   Seizures Mother    Hypertension Mother    COPD Father    Colon cancer Neg Hx    Gastric cancer Neg Hx    Esophageal cancer Neg Hx     Social History Social History   Tobacco Use   Smoking status: Former    Packs/day: 0.30    Years: 40.00    Total pack years: 12.00    Types: Cigarettes    Quit date: 01/28/2013    Years since quitting: 8.7   Smokeless tobacco: Never  Substance Use Topics   Alcohol use: No   Drug use: No     Allergies   Patient has no known allergies.   Review of Systems Review of Systems Per HPI  Physical Exam Triage Vital Signs ED Triage Vitals [11/06/21 1759]  Enc Vitals Group     BP (!) 146/76     Pulse Rate 63     Resp (!) 22     Temp 97.7 F (36.5 C)     Temp Source Oral     SpO2 94 %     Weight      Height      Head Circumference      Peak Flow      Pain Score      Pain Loc      Pain Edu?      Excl. in [place]?    No data found.  Updated Vital Signs BP (!) 146/76 (BP Location: Right Arm)   Pulse 63   Temp 97.7 F (36.5 C) (Oral)   Resp (!) 22   SpO2 94%   Visual Acuity Right Eye Distance:   Left Eye Distance:   Bilateral Distance:    Right Eye Near:   Left Eye Near:    Bilateral Near:     Physical Exam Vitals and nursing note reviewed.  Constitutional:      General: He is not in acute distress.    Appearance: Normal appearance.  HENT:     Head: Normocephalic.     Right Ear: Ear canal and external ear normal. Tympanic membrane is erythematous and bulging.     Left Ear: Ear canal and external ear normal. Tympanic membrane is erythematous. Tympanic membrane is not bulging.     Nose: Congestion present. No rhinorrhea.     Right Turbinates: Enlarged and swollen.     Left Turbinates: Enlarged and swollen.     Right Sinus: No maxillary sinus tenderness or frontal sinus tenderness.     Left Sinus: No maxillary sinus tenderness or frontal sinus tenderness.      Mouth/Throat:     Lips: Pink.     Mouth: Mucous membranes are moist.     Pharynx: Oropharynx is clear. Uvula midline. No oropharyngeal exudate or posterior oropharyngeal erythema.  Eyes:  Extraocular Movements: Extraocular movements intact.     Conjunctiva/sclera: Conjunctivae normal.     Pupils: Pupils are equal, round, and reactive to light.  Cardiovascular:     Rate and Rhythm: Normal rate and regular rhythm.     Pulses: Normal pulses.     Heart sounds: Normal heart sounds.  Pulmonary:     Effort: Pulmonary effort is normal. No respiratory distress.     Breath sounds: Normal breath sounds. No stridor. No wheezing, rhonchi or rales.  Abdominal:     General: Bowel sounds are normal.     Palpations: Abdomen is soft.     Tenderness: There is no abdominal tenderness.  Musculoskeletal:     Cervical back: Normal range of motion.  Lymphadenopathy:     Cervical: No cervical adenopathy.  Skin:    General: Skin is warm and dry.  Neurological:     General: No focal deficit present.     Mental Status: He is alert and oriented to person, place, and time.  Psychiatric:        Mood and Affect: Mood normal.      UC Treatments / Results  Labs (all labs ordered are listed, but only abnormal results are displayed) Labs Reviewed - No data to display  EKG   Radiology No results found.  Procedures Procedures (including critical care time)  Medications Ordered in UC Medications - No data to display  Initial Impression / Assessment and Plan / UC Course  I have reviewed the triage vital signs and the nursing notes.  Pertinent labs & imaging results that were available during my care of the patient were reviewed by me and considered in my medical decision making (see chart for details).  Patient presents for complaints of right ear pain and nasal congestion that started today.  On exam, patient has bulging and erythema of the right tympanic membrane.  He does have mild erythema of  the left TM.  Symptoms are consistent with a right otitis media.  We will start patient on Augmentin at this time.  Supportive care recommendations were provided to the patient.  Patient verbalizes understanding.  All questions were answered. Final Clinical Impressions(s) / UC Diagnoses   Final diagnoses:  Acute suppurative otitis media of right ear without spontaneous rupture of tympanic membrane, recurrence not specified     Discharge Instructions      Take medication as prescribed.  As discussed, start using the Flonase you have at home. May take over-the-counter ibuprofen or Tylenol as needed for pain, fever, or general discomfort. Warm compresses to the affected ear help with comfort. Do not stick anything inside the ear while symptoms persist.  Avoid using your earplugs when possible. Avoid getting water inside of the ear while symptoms persist. Follow-up with your primary care physician if symptoms fail to improve after completing this treatment regimen.     ED Prescriptions     Medication Sig Dispense Auth. Provider   amoxicillin-clavulanate (AUGMENTIN) 875-125 MG tablet Take 1 tablet by mouth every 12 (twelve) hours. 14 tablet Tirsa Gail-Warren, Alda Lea, NP      PDMP not reviewed this encounter.   Tish Men, NP 11/06/21 [phone]

## 2021-11-06 NOTE — ED Triage Notes (Signed)
Pt reports right ear pain, nasal congetsion x 1 day.

## 2022-01-11 ENCOUNTER — Ambulatory Visit: Payer: Medicare (Managed Care) | Attending: Student in an Organized Health Care Education/Training Program

## 2022-01-11 NOTE — Unmapped (Deleted)
[facility]     Chief Complaint   Hospital follow-up for acute ischemic stroke    History of Present Illness     [name] is a 66 y.o. male with multiple L middle cerebral artery strokes (L frontal/temporal/parietal stroke 03/28/2021, L occipital stroke 03/18/2021, L M3 occlusion s/p tPA 2017 with no residual deficits), HTN, HLD, DMII, GERD, tobacco use disorder, COPD presenting to [facility] 01/10/2022 for stroke management.    Admission 09/20-09/22/2023 for acute aphasia. *** CT head with encephalomalacia of L parietal lobe. CTA head and neck with high-grade stenosis of L M1 progressed from previous.  TTE (no bubble) with EF 65-70%, no RWMA, L atrium upper limit of normal, aortic root dilation 3.89mm. Etiology of strokes     Admission 03/01-03/10/2021 for recurrent aphasia on waking. BP 162/94, BGL 97, NIHSS 6. CT head with evolving L occipital stroke. CTA head and neck with complete L M2 occlusion. CTP with moderate area of hypoperfusion in the left MCA territory with a central ischemic core of 25 mL and a 48 mL penumbra. Not a candidate for thrombolytic due to recent stroke. Underwent DSA with distal L M2 occlusion with severe stenosis - no thrombectomy. MRI brain with larger infarct L frontal/temporal/parietal. TTE w/bubble EF 65-70%, no RWMA, L atrium normal, no PFO. Cardiac event monitor worn x 2 days with no atrial fibrillation. Continued DAPT per SAMMPRIS and Atorvastatin 80mg  daily.    Admission 02/19-02/22/2023 for acute aphasia. BP 187/122, BGL 97, NIHSS 12. CT head unremarkable. CTA head and neck with L M2 occlusion. Not a candidate for thrombolysis due to unknown last known well. Underwent digital subtraction angiography (DSA) that revealed high-grade stenosis of L inferior M2 branch with resolution of intraluminal thrombus. MRI brain with acute LEFT occipital stroke. Started on DAPT per SAMMPRIS. Left AMA before workup complete.    Interval:  Last seen  ***      Histories     Medical History:  HTN    Surgical History:  None    Social History:  Tobacco: Denies  EtOH: Denies  Drugs: Denies    Family History:  Mother -  Father -  Siblings -  Children -       Allergies and Medications     Allergies:  Allergies   Allergen Reactions    Penicillins Hives     Tolerates cephalosporins (cephalexin, cefdinir, ceftriaxone)         Home Medications:  Current Outpatient Medications on File Prior to Visit   Medication Sig Dispense Refill    ARIPiprazole (ABILIFY) 5 MG tablet Take 1 tablet (5 mg total) by mouth daily.      aspirin 81 MG chewable tablet Chew 1 tablet (81 mg total) by mouth daily with breakfast. 30 tablet 0    atorvastatin (LIPITOR) 80 MG tablet Take 1 tablet (80 mg total) by mouth at bedtime. 30 tablet 0    cloNIDine HCL (CATAPRES) 0.2 MG tablet Take 1 tablet (0.2 mg total) by mouth 2 times a day.      clopidogreL (PLAVIX) 75 mg tablet Take 1 tablet (75 mg total) by mouth daily. 30 tablet 6    diltiazem (CARDIZEM CD) 120 MG 24 hr capsule Take 1 capsule (120 mg total) by mouth daily. 30 capsule 3    metoprolol succinate (TOPROL-XL) 100 MG 24 hr tablet Take 1 tablet (100 mg total) by mouth daily.      nicotine (NICODERM CQ) 14 mg/24 hr Place 1 patch  onto the skin daily. 28 patch 0    nicotine, polacrilex, (NICORETTE) 2 mg gum Take 1 each (2 mg total) by mouth every hour as needed for Smoking cessation. 100 Stick 0    omeprazole (PRILOSEC) 40 MG capsule Take 1 capsule (40 mg total) by mouth every morning before breakfast.      oxybutynin (DITROPAN-XL) 10 MG 24 hr tablet Take 1 tablet (10 mg total) by mouth daily.      tamsulosin (FLOMAX) 0.4 mg Cap Take 1 capsule (0.4 mg total) by mouth at bedtime. Take 0.4 mg by mouth daily 30 capsule 3    varenicline (CHANTIX) 1 mg tablet Take 1 tablet (1 mg total) by mouth 2 times a day.       No current facility-administered medications on file prior to visit.            Review of Systems     Review of Systems is negative except  for what is stated in the HPI       Physical Exam     @VITALSBRIEF @    General: Well-developed, awake, ambulatory in clinic, no acute distress  HEENT: Anicteric sclera, no conjunctival pallor, mucus membranes moist  NECK: Supple, non-tender, no LAD, no carotid bruit  LUNGS: CTAB in upper and lower lung fields bilaterally, no increased WOB, no accessory muscle usage or retractions  CV: RRR, clear and constant S1 and S2, no appreciable murmurs/rubs/gallops  ABD: Soft, non-tender, non-distended  EXT: No edema or erythema    Neurological Exam  Mental status: Patient alert and oriented x4  Recent and Remote memory: Intact  Attention/Concentration: Intact  Fund of knowledge: Aware of current events  Speech/Language: No dysarthria or aphasia    Cranial Nerves  2: No visual field deficits with finger counting  Pupils: PERRL 2mm b/l  3/4/6: EOMI, no rotary nystagmus bilaterally at primary gaze  5: Sensation V1, V2, V3 distribution b/l  7: No nasolabial fold flattening  8: Hearing intact to voice  9/10: Equal palate elevation  11: Shrugs shoulders, turns head left and right, SCM and trapezius equal in strength  12: Tongue midline, normal right to left movements      Motor  Drift: Absent in UE and LE  Bulk: Normal  Tone: Normal  No abnormal movements  No extinction    Strength:  Upper extremity  RIGHT:            Del 5 / Bicep 5 / Tricep 5 / WE 5 / WF 5 / FE 5 / FF 5 / FA 5  LEFT:       Del 5 / Bicep 5 / Tricep 5 / WE 5 / WF 5 / FE 5 / FF 5 / FA 5    Lower extremity  RIGHT:            HF 5 / HE 5 / Had 5 / Hab 5 / KE 5 / KF 5 / PF 5 / DF 5  LEFT:               HF 5 / HE 5 / Had 5 / Hab 5 / KE 5 / KF 5 / PF 5 / DF 5    Sensation:  Light touch: Intact bilaterally  Pinprick: Intact bilaterally  Temperature: Intact bilaterally  Proprioception: Intact bilaterally  Vibration: Intact bilaterally    Coordination:  Finger to nose: Intact  Rapid alternating movements: Intact  Heel to shin: Intact    Gait and  station: Normal gait, able  to walk on heels and toes, Tandem gait normal  Romberg: Negative    Reflexes:       Biceps   (C5-C6) Brachioradialis (C5-C6) Triceps   (C7-C8) Patellar   (L3-L4) Achilles   (S1-S2)   Right 2+ 2+ 2+ 2+ 2+   Left 2+ 2+ 2+ 2+ 2+   Toes downgoing b/l  Hoffman's negative b/l       Labs     Labs reviewed and remarkable for:    Labs *** xx/xx/xxxx:  BUN  Cr    hsTroponin *** (xx/xx/xxxx)    LDL *** (xx/xx/xxxx)  A1C *** (xx/xx/xxxx)      Imaging     TTE 10/18/2021  Study Conclusions     - Left ventricle: The cavity size is normal. Wall thickness was increased     increased in a pattern of mild to moderate LVH. Systolic function is     vigorous. The estimated ejection fraction is 65-70%. Wall motion is     normal; there are no regional wall motion abnormalities. Grade I diastolic     dysfunction.   - Right ventricle: The cavity size is normal. Systolic function is normal.     TAPSE: 1.7cm.Tricuspid annular systolic velocity: 11cm/s.   - Pulmonary arteries: Systolic pressure could not be accurately estimated.   - Inferior vena cava: The IVC is normal-sized. Respirophasic diameter     changes are in the normal range (> 50%).     ----------------------------------------------------------------------------   Cardiac Anatomy     Left ventricle:     - The cavity size is normal. Wall thickness was increased increased in a     pattern of mild to moderate LVH. Systolic function is vigorous. The     estimated ejection fraction is 65-70%. Wall motion is normal; there are no     regional wall motion abnormalities.   - Grade I diastolic dysfunction.   Aorta:   Aortic root: The root is dilated and 3.7cm diameter.   Descending aorta: The vessel is 2.5cm diameter.   Aortic valve:     - Trileaflet There is mild thickening. The mean systolic gradient is 2mm Hg.     The peak systolic gradient is 5mm Hg. The LVOT to aortic valve VTI ratio     is 0.98. The valve area is 2.3cm^2. The valve area index is 1.12cm^2/m^2.     The ratio of LVOT to  aortic valve peak velocity is 0.97. The valve area is     2.2cm^2. The valve area index is 1.11cm^2/m^2. The ratio of LVOT to aortic     valve mean velocity is 1.1. The valve area is 2.5cm^2. The valve area     index is 1.25cm^2/m^2.   Mitral valve:     - The valve is structurally normal. Leaflet separation is normal. There is     no systolic anterior motion. There is no evidence for stenosis. There is     no significant regurgitation.   Left atrium:  The atrium is at the upper limits of normal in size.   Pulmonary artery:     - Systolic pressure could not be accurately estimated.   Right ventricle:     - The cavity size is normal. Systolic function is normal. TAPSE:     1.7cm.Tricuspid annular systolic velocity: 11cm/s.   Pulmonic valve:     - Poorly visualized. There is trivial regurgitation. The mean systolic     gradient is 1mm Hg.  The peak systolic gradient is 2mm Hg.   Tricuspid valve:     - The valve is structurally normal. There is no evidence for stenosis. There     is no significant regurgitation.   Right atrium:  The atrium is normal in size.   Pericardium:     - There is no pericardial effusion.   Systemic veins:   Inferior vena cava: The IVC is normal-sized.  Respirophasic diameter changes   are in the normal range (> 50%).     MRI Brain w/o contrast 10/17/2021  FINDINGS:    2 punctate foci of diffusion restriction are noted within the left occipital subcortical region and left corona radiata (series 4 images 21 and 22). No mass effect is seen.  A wedge-shaped area of encephalomalacia in the left temporal parietal lobe is consistent with remote infarct, unchanged compared to CT scan. Remote lacunar infarcts are present in the bilateral corona radiata and left pons. Moderate confluent white matter signal hyperintensities are present.    The ventricles and sulci are enlarged consistent with mild diffuse atrophy.    Impression   IMPRESSION:    1.  Punctate foci of acute ischemia in the left occipital  subcortical region and left corona radiata. No mass effect.  2.  Moderate small vessel disease and remote infarcts as described.          CTA Head & Neck 10/17/2021  FINDINGS:    The diagnostic quality of the examination is adequate.    Pulmonary arteries: No included emboli.    Arch anatomy and vessel origins: Standard three-vessel arch anatomy. Patent innominate, bilateral common carotid, subclavian, and vertebral artery origins.    Right carotid system: Calcified and soft plaque at the bifurcation with no stenosis.    Left carotid system: Soft plaque at the bifurcation with no stenosis.    Right vertebral artery: Normal.    Left vertebral artery:  Mild luminal irregularity without stenosis of the left vertebral artery origin, likely atherosclerotic, otherwise normal enhancement distally..    Intracranial posterior circulation: Normal bilateral V4 segments. Bilateral PICA, anterior inferior cerebellar, and superior cerebellar arteries enhance normally. The basilar artery enhances normally. Posterior cerebral arteries enhance normally.    Intracranial anterior circulation: Mild to moderate calcified plaques involve the bilateral cavernous and supraclinoid internal carotids with mild left supraclinoid ICA stenosis, unchanged. Focal severe stenosis at the left M1 origin, progressed since prior, as well as decreased distal left MCA collateral vessels in the region of left temporoparietal encephalomalacia. Otherwise the left MCA vasculature enhances normally. Right MCA enhances normally. The bilateral anterior cerebral arteries enhance normally.    Venous structures: Venous structures are not well opacified in the arterial phase and cannot be assessed.    Extravascular findings: Intracranial findings are reported separately. Moderate upper lung zone predominant emphysema bilaterally. Multilevel cervical degenerative disc disease change without high-grade spinal canal stenosis at any cervical level. No suspicious lung  nodules included.    3D reconstructions of the neck vessels including MIP and curved MPR images reconstructed on a separate 3D workstation show the above-described findings to better effect.  3D reconstructions of the cranial vessels including MIP and VRT images reconstructed on a separate 3D workstation show the above-described findings to better effect    Impression   IMPRESSION:    Neck  1.  Mild atherosclerotic disease with no significant carotid or vertebral artery stenosis.  2.  No dissection or pseudoaneurysm.    Head  1.  Severe short  segment left M1 origin stenosis, progressed since prior.  2.  Mild left supraclinoid internal carotid artery stenosis, unchanged since prior.  3.  No large vessel occlusion.     CT Head w/o contrast 10/17/2021  FINDINGS:    Adequate diagnostic quality.    Brain parenchyma: Left parietal temporal encephalomalacia, likely relates to remote infarct. Left basal ganglia hypoattenuation, likely remote lacunar infarct. No new gray-white disruption. No mass effect. Moderate nonspecific patchy and confluent white matter low-attenuation. No acute intraparenchymal hemorrhage or mass effect.    Ventricles and extraaxial spaces: Expected dilatation of the left lateral ventricle. Ventricles otherwise normal. Mildly prominent extra-axial fluid spaces, likely compensatory.    Orbits, paranasal sinuses, mastoids: No acute orbital abnormality. Clear paranasal sinuses. Bilateral mastoid effusion.    Extracranial soft tissues: Normal.    Calvarium and skull base: No fracture or suspicious osseous lesion.    Other: Atherosclerotic vascular calcifications.    Impression   IMPRESSION:    1.  Left parietotemporal encephalomalacia and remote basal ganglia lacunar infarcts.  2.  No intracranial mass effect or hemorrhage.     Cardiac event monitor 04/2021  CONCLUSION:  The recording was limited to 2 days, 21 hours, and 55 minutes. The rhythm was sinus with minimum rate 59 beats per minute at 3:54 AM and  maximum rate 140 beats per minute at 5:25 PM. The patient triggered 2 recordings which corresponded to sinus rhythm at 79 and 86 beats per minute. There was a 6-beat run of ventricular tachycardia at 157 beats per minute at 6:30 PM on 05/01/2021. There were 34 premature ventricular complexes and 397 supraventricular premature complexes. There was no atrial fibrillation.    MRI brain w/o contrast 03/31/2021  FINDINGS:    Adequate diagnostic quality.    Brain parenchyma: Large confluent area of diffusion restriction centered in the left parietal-temporal region in a similar distribution to the recent CT perfusion with associated increased T2/FLAIR signal hyperintensity. This area of diffusion restriction is new from the prior MRI dated 03/19/2021. As compared to the prior MRI there is interval decrease in spotty diffusion weighted signal in the left occipital lobe.  Unchanged tiny focus of increased diffusion signal in the right frontal white matter without definite ADC signal abnormality.    Moderate patchy and confluent white matter disease without mass effect, likely related to a combination of chronic microvascular ischemia and lacunar infarcts. Small focus of susceptibility artifact in the right pons is unchanged and may relate to a remote microhemorrhage or tiny cavernoma.. Partially empty sella. Remote lacunar infarct in the left basal ganglia Normal optic chiasm, and cerebellar tonsils.    Ventricles and extraaxial spaces: Normal ventricular size and position. No extra-axial fluid collection.    Marrow signal of calvarium and skull base: Normal.    Orbits, paranasal sinuses, and mastoid regions: No acute orbital abnormality. Mild paranasal sinus mucosal thickening. Large mastoid effusions bilaterally.    Vascular structures: Abnormal flow void in a posterior M2 segment on the left consistent with known occlusion. Arterial and venous flow voids are otherwise unremarkable.    Other: Normal included extracranial  structures. Upper cervical spine without significant abnormality.    Impression   IMPRESSION:    1.  Acute infarct involving the left parietal-temporal region. Left M2 occlusion with abnormal flow void is redemonstrated.  2.  Interval decrease in spotty diffusion signal in the left occipital lobe related to evolving subacute infarct.     TTE w/bubble 03/30/2021  Study Conclusions     -  Left ventricle: The cavity size is normal. Asymmetric septal hypertrophy with basal septal     dimension in the range of 1.7 cm. Systolic function was vigorous. The estimated ejection fraction     was in the range of 65% to 70%. Wall motion was normal; there were no regional wall motion     abnormalities. There is a mild intracavitary obstruction gradient with a peak velocity of 2.2 m/s.     Doppler parameters are consistent with abnormal left ventricular relaxation (grade 1 diastolic     dysfunction).   - Aortic valve: Poorly visualized.   - Descending aorta: The descending aorta measures 2.6cm (AP) and is mildly dilated.   - Left atrium: No right to left shunt with saline contrast in the baseline state.   - Right ventricle: The cavity size is normal. Systolic function was normal. TAPSE: 2.2cm.   - Pulmonary arteries: Systolic pressure could not be accurately estimated.   - Inferior vena cava: The vessel was normal in size. The respirophasic diameter changes were in the     normal range (>= 50%), consistent with normal central venous pressure.   - Pericardium, extracardiac: A trivial pericardial effusion is identified.   - Given the study indication, recommend cardiac MRI if clinically indicated.     -------------------------------------------------------------------   Cardiac Anatomy     Left ventricle:     - The cavity size is normal. Asymmetric septal hypertrophy with basal septal dimension in the range     of 1.7 cm. Systolic function was vigorous. The estimated ejection fraction was in the range of 65%     to 70%. Wall motion  was normal; there were no regional wall motion abnormalities. There is a mild     intracavitary obstruction gradient with a peak velocity of 2.2 m/s.     - Wall motion score: 1.00.     - Doppler parameters are consistent with abnormal left ventricular relaxation (grade 1 diastolic     dysfunction).     Aorta:   Aortic root:     - The aortic root is normal in size.     Descending aorta:     - The descending aorta measures 2.6cm (AP) and is mildly dilated.     Aortic valve:     - Poorly visualized.     Doppler:     - Transvalvular velocity is within the normal range. There is no stenosis. No regurgitation.     Mitral valve:     - Structurally normal valve. Mobility was not restricted.     Doppler:     - Transvalvular velocity is within the normal range. There is no evidence for stenosis. No     regurgitation.     Left atrium:     - The atrium is normal in size. No right to left shunt with saline contrast in the baseline state.     Pulmonary artery:     - Systolic pressure could not be accurately estimated.     Main pulmonary artery:     -     Systemic veins:   Inferior vena cava: The vessel was normal in size. The   respirophasic diameter changes were in the normal range (>= 50%),   consistent with normal central venous pressure.   Right ventricle:     - The cavity size is normal. Systolic function was normal. TAPSE: 2.2cm.     Pulmonic valve:     - Poorly visualized.  Doppler:     - Transvalvular velocity is within the normal range. There is no evidence for stenosis. No     regurgitation.     Tricuspid valve:     - Structurally normal valve.     Doppler:     - Transvalvular velocity is within the normal range. No regurgitation.     Right atrium:     - The atrium is normal in size.     Pericardium:     - A trivial pericardial effusion is identified.     Systemic veins:   Inferior vena cava:     - The vessel was normal in size. The respirophasic diameter changes were in the normal range (>=    50%), consistent with  normal central venous pressure.     MRI Brain w/o contrast 03/30/2021               CT Brain perfusion 03/28/2021  FINDINGS:    The submitted perfusion data is diagnostic with optimal time density curves.    Using the threshold of Tmax greater than 6 secs, there is a moderate area of hypoperfusion within the left MCA territory predominantly involving the left temporal lobe but also portions of the left parietal lobe, left temporal operculum, left subinsular region with total volume of hypoperfusion of 73 mL.    Using the threshold of CBF less than 30%, a 25 mL ischemic core is identified within the region of the left temporal lobe.    The total mismatch difference is 48 mL.    The mismatch ratio is 2.9.    Impression   IMPRESSION:    1.  moderate area of hypoperfusion in the left MCA territory with a central ischemic core of 25 mL and a 48 mL penumbra.     CTA head and neck 03/28/2021  ** ADDENDUM #1**  Following availability of perfusion imaging the CTA was reevaluated. There is Complete occlusion of the left posterior M2 segment in the peri-insular sulcus. There is poor collateral flow. These findings correspond to known perfusion defect     Approved by Helane Gunther, MD on 03/28/2021 4:52 PM EST     I have personally reviewed the images and I agree with this report.     Report Verified by: Leda Roys, MD at 03/29/2021 9:04 AM EST** ORIGINAL REPORT **  EXAM: CT ANGIOGRAPHY HEAD W &/OR WO WITH VIZ LVO  EXAM: CT ANGIO NECK W AND OR WO CONTRAST     INDICATION: Neuro deficit, acute, stroke suspected     TECHNIQUE: Axial thin section CT angiography of the neck and head was performed with 80 mL of IOHEXOL 350 MG IODINE/ML INTRAVENOUS SOLUTION administered intravenously (accession [id number]), 80 mL of IOHEXOL 350 MG IODINE/ML INTRAVENOUS SOLUTION administered intravenously (accession [id number]). 3-D reconstructions of the cervical and cranial vessels were performed on a separate 3-D workstation,  including MIP, curved MPR, and VRT images utilizing a radiologist approved protocol and were interpreted along with source images. Image data from the CTA was sent to [email or web address] for LVO analysis. This analysis was sent to the stroke team for clinical use.       COMPARISON: None available.     FINDINGS:     The cervical portion of the exam is limited by motion artifact.     Pulmonary arteries: No included emboli.     Arch anatomy and vessel origins: Standard three-vessel arch anatomy. Patent innominate, bilateral common carotid, subclavian, and vertebral artery origins.  Right carotid system: Normal common, external, and internal carotid arteries.     Left carotid system: Normal common, external, and internal carotid arteries.     Right vertebral artery: Normal.     Left vertebral artery:  Normal.     Intracranial posterior circulation: Normal bilateral V4 segments.  The basilar artery enhances normally. Posterior cerebral arteries enhance normally.      Intracranial anterior circulation: Normal cavernous and supraclinoid internal carotids. The bilateral middle cerebral arteries and branches enhance normally. The bilateral anterior cerebral arteries enhance normally.     Venous structures: Venous structures in the head are fairly well opacified in the arterial phase with no significant abnormality.     Extravascular findings: No acute orbital abnormality. Clear paranasal sinuses. Clear mastoid air cells. No included neck mass or adenopathy. Normal intracranial structures. No compressive abnormality of the cervical spine. No suspicious lung nodules included.     3D reconstructions of the neck vessels including MIP and curved MPR images reconstructed on a separate 3D workstation show no evidence of dissection or pseudoaneurysm.     3D reconstructions of the cranial vessels including MIP and VRT images reconstructed on a separate 3D workstation show no evidence of intracranial aneurysm, stenosis, or AV shunting lesion.  No occlusive change is seen.     IMPRESSION:     Neck  1.  No acute arterial abnormality     Head  1.  No acute arterial abnormality    CT Head w/o contrast 03/28/2021  FINDINGS:    Adequate diagnostic quality.    Brain parenchyma: Low-attenuation in the left parietal lobe is more conspicuous compared to CT, but somewhat correlates with regions of diffusion restriction on MRI 03/19/2021. There is extensive patchy and confluent periventricular and subcortical white matter low-attenuation which is overall very similar. A remote lacunar infarct in the left basal ganglia region is similar. No intracranial mass effect or definite hemorrhage.    Ventricles and extraaxial spaces: Normal ventricular system. No extra-axial fluid collection.    Orbits, paranasal sinuses, mastoids: No acute orbital abnormality. Minimal paranasal sinus mucosal thickening. Moderate mucosal thickening left mastoid air cells    Extracranial soft tissues: Normal.    Calvarium and skull base: No fracture or suspicious osseous lesion.    Other: Atherosclerotic vascular calcifications.    Impression   IMPRESSION:    1.  Evolving left MCA distribution infarct in the general region of diffusion restriction on MRI 03/19/2021. If there is concern for progressive infarction, MRI would be more sensitive for further evaluation.  2.  No intracranial mass effect or definite hemorrhage.  3.  Chronic white matter disease and remote left basal ganglia lacunar infarct.     MRI Brain w/o contrast 03/20/2021  IMPRESSION:         1. Recent Left parieto-occipital infarct.  2. No intracranial mass or hemorrhage.     Report Verified by: Burt Ek, MD at 03/20/2021 12:19 PM EST** ORIGINAL REPORT **  EXAM: MRI BRAIN WO CONTRAST      INDICATION: Stroke, follow up     TECHNIQUE:  MRI brain performed including multiplanar T1 and T2 weighted sequences.      COMPARISON: CT 03/18/2021     FINDINGS:      Ventricles unchanged, borderline in size. No hematoma. Small microbleed  right pons. Area of 4 x 1.5 x 2 cm cortical diffusion restriction left parieto-occipital.      Bilateral symmetric foci small and confluent areas of T2/FLAIR hyperintensity cerebral hemisphere  white matter bilaterally, thalami, pons, midbrain with multiple  lacunar changes. Prominent perivascular spaces throughout hemisphere white matter.      No large artery or major dural sinus flow signal abnormality. Orbits and paranasal sinuses unremarkable. Temporal bones exhibit bilateral mastoid fluid effusion.     IMPRESSION:      1.  Normal MRI brain without contrast.  2.  No intracranial mass, signal abnormality, or hemorrhage.     Acute LEFT occipital stroke       Moderate-severe white matter disease      Microbleed RIGHT pons    Routine EEG 03/19/2021  EEG Interpretation:   This EEG was within normal limits for a patient of this age in the awake state. No focal, lateralizing, or epileptiform features were seen during the recording.     Clinical correlation is recommended.    CTA Head and neck 03/18/2021  FINDINGS:    The diagnostic quality of the examination is adequate.    Pulmonary arteries: No included emboli.    Arch anatomy and vessel origins: Standard three-vessel arch anatomy. Patent innominate, bilateral common carotid, subclavian, and vertebral artery origins.    Right carotid system: Calcified and soft plaque at the bifurcation with no stenosis.    Left carotid system: Normal common, external, and internal carotid arteries.    Right vertebral artery: Normal.    Left vertebral artery:  Normal.    Intracranial posterior circulation: Normal bilateral V4 segments.  Bilateral PICA, anterior inferior cerebellar, and superior cerebellar arteries enhance normally. The basilar artery enhances normally. Posterior cerebral arteries enhance normally.    Intracranial anterior circulation: Mild calcified plaques of the parasellar internal carotids with no stenosis. Short segment occlusion of one of the left M2 branches in  the sylvian fissure (series 6, image 1040). Multifocal stenosis of this M2 branch distally.Moderate stenosis of the right proximal M2 posterior branch. The bilateral anterior cerebral arteries enhance normally.    Venous structures: Venous structures are not well opacified in the arterial phase and cannot be assessed.    Extravascular findings: No acute orbital abnormality. Mild paranasal sinus mucosal thickening. Moderate mastoid opacification.. No included neck mass or adenopathy. Extravascular intracranial findings are discussed separately. Mild multilevel cervical degenerative disc disease and facet arthrosis with no significant compressive abnormality. No suspicious lung nodules included. Emphysematous changes.    3D reconstructions of the neck vessels including MIP and curved MPR images reconstructed on a separate 3D workstation show no evidence of dissection or pseudoaneurysm.    3D reconstructions of the cranial vessels including MIP and VRT images reconstructed on a separate 3D workstation show the above-described findings to better advantage.    Impression   IMPRESSION:    Neck  1.  Mild atherosclerotic disease with no significant carotid or vertebral artery stenosis.  2.  No dissection or pseudoaneurysm.    Head  1.  Short segment occlusion of one of the left M2 branches in the sylvian fissure. Multifocal stenosis of this M2 branch distally.  2.  Moderate stenosis of the right M2 posterior branch.     CT Head w/o contrast 03/18/2021  FINDINGS:    Adequate diagnostic quality.    Brain parenchyma: Extensive nonspecific white matter low-attenuation. Remote lacunar infarct in the left internal capsule genuine, unchanged.    Ventricles and extraaxial spaces: Proportionate enlargement of ventricles and sulci. No extra-axial fluid collection.    Orbits, paranasal sinuses, mastoids: No acute orbital abnormality. Clear paranasal sinuses. Moderate mastoid opacification..    Extracranial soft  tissues:  Normal.    Calvarium and skull base: No fracture or suspicious osseous lesion.    Other: No other abnormalities.    Impression   IMPRESSION:    1.  Diffuse atrophy.  2.  Nonspecific white matter disease likely chronic microvascular ischemia.  3.  No intracranial hemorrhage or mass effect.       ECG *** xx/xx/xxxx       Impression and Plan     Danny PartridgeJohnny O Myers is a 66 y.o. male with      RTC ***      Discussed this plan of care with Vascular Neurology Attending Dr. [name]***.  Myrtice LauthLauren Aniko Finnigan, MD PGY5 Vascular Neurology Fellow

## 2022-01-16 NOTE — Unmapped (Signed)
Mailed Missed Appointment No Show letter to the address on file, for the department of Neurology.

## 2022-02-16 ENCOUNTER — Inpatient Hospital Stay
Admit: 2022-02-16 | Discharge: 2022-02-16 | Disposition: A | Discharge status: Home / Self Care | Payer: Medicare (Managed Care)

## 2022-02-16 DIAGNOSIS — R1084 Generalized abdominal pain: Secondary | ICD-10-CM

## 2022-02-16 LAB — HEPATIC FUNCTION PANEL
ALT: 5 U/L (ref 7–52)
AST: 9 U/L (ref 13–39)
Albumin: 4.6 g/dL (ref 3.5–5.7)
Alkaline Phosphatase: 88 U/L (ref 36–125)
Bilirubin, Direct: 0.15 mg/dL (ref 0.00–0.40)
Bilirubin, Indirect: 0.85 mg/dL (ref 0.00–1.10)
Total Bilirubin: 1 mg/dL (ref 0.0–1.5)
Total Protein: 7.6 g/dL (ref 6.4–8.9)

## 2022-02-16 LAB — BASIC METABOLIC PANEL
Anion Gap: 7 mmol/L (ref 3–16)
BUN: 14 mg/dL (ref 7–25)
CO2: 28 mmol/L (ref 21–33)
Calcium: 9.4 mg/dL (ref 8.6–10.3)
Chloride: 104 mmol/L (ref 98–110)
Creatinine: 1.31 mg/dL (ref 0.60–1.30)
EGFR: 60
Glucose: 93 mg/dL (ref 70–100)
Osmolality, Calculated: 288 mOsm/kg (ref 278–305)
Potassium: 3.7 mmol/L (ref 3.5–5.3)
Sodium: 139 mmol/L (ref 133–146)

## 2022-02-16 LAB — CBC
Hematocrit: 37.7 % (ref 38.5–50.0)
Hemoglobin: 12.6 g/dL (ref 13.2–17.1)
MCH: 25.5 pg (ref 27.0–33.0)
MCHC: 33.3 g/dL (ref 32.0–36.0)
MCV: 76.6 fL (ref 80.0–100.0)
MPV: 8.4 fL (ref 7.5–11.5)
Platelets: 255 10*3/uL (ref 140–400)
RBC: 4.92 10*6/uL (ref 4.20–5.80)
RDW: 20.3 % (ref 11.0–15.0)
WBC: 5.7 10*3/uL (ref 3.8–10.8)

## 2022-02-16 LAB — DIFFERENTIAL
Basophils Absolute: 51 /uL (ref 0–200)
Basophils Relative: 0.9 % (ref 0.0–1.0)
Eosinophils Absolute: 86 /uL (ref 15–500)
Eosinophils Relative: 1.5 % (ref 0.0–8.0)
Lymphocytes Absolute: 1864 /uL (ref 850–3900)
Lymphocytes Relative: 32.7 % (ref 15.0–45.0)
Monocytes Absolute: 467 /uL (ref 200–950)
Monocytes Relative: 8.2 % (ref 0.0–12.0)
Neutrophils Absolute: 3232 /uL (ref 1500–7800)
Neutrophils Relative: 56.7 % (ref 40.0–80.0)
nRBC: 0 /100 WBC (ref 0–0)

## 2022-02-16 LAB — LIPASE: Lipase: 9 U/L (ref 4–82)

## 2022-02-16 LAB — POC GLU MONITORING DEVICE: POC Glucose Monitoring Device: 134 mg/dL (ref 70–100)

## 2022-02-16 MED ORDER — omeprazole (PRILOSEC) 40 MG capsule
40 | ORAL_CAPSULE | Freq: Every morning | ORAL | 0 refills | Status: AC
Start: 2022-02-16 — End: ?

## 2022-02-16 MED ORDER — famotidine (PF) (PEPCID) injection 20 mg
20 | Freq: Once | INTRAVENOUS | Status: AC
Start: 2022-02-16 — End: 2022-02-16
  Administered 2022-02-16: 22:00:00 20 mg via INTRAVENOUS

## 2022-02-16 MED ORDER — aluminum & magnesium hydroxide-simethicone (MYLANTA) suspension 15 mL
400-400-40 | Freq: Once | ORAL | Status: AC
Start: 2022-02-16 — End: 2022-02-16
  Administered 2022-02-16: 22:00:00 15 mL via ORAL

## 2022-02-16 MED FILL — MAG-AL PLUS EXTRA STRENGTH 400 MG-400 MG-40 MG/5 ML ORAL SUSPENSION: 400-400-40 400-400-40 mg/5 mL | ORAL | Qty: 30

## 2022-02-16 MED FILL — FAMOTIDINE (PF) 20 MG/2 ML INTRAVENOUS SOLUTION: 20 20 mg/2 mL | INTRAVENOUS | Qty: 2

## 2022-02-16 NOTE — Unmapped (Signed)
Speaking with visitor/fiance, she states that patient's speech has changed. Patient with baseline aphasia but visitor states speech is different.

## 2022-02-16 NOTE — Unmapped (Signed)
ED Note    Date of Service: 02/16/2022  Reason for Visit: Nausea and Dizziness      Patient History     HPI  [name] is a 67 y.o. 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 Patient mildly aphasic at baseline and unable to provide consistent answers to questions on exam. Initially denies abdominal pain but shortly after endorses nausea and abdominal pain for the last few days. Denies any vomiting but has had decreased PO intake due to pain. Patient friend at bedside endorsing possible constipation and feels nausea and abdominal pain main reason for presentation today. Denies any fevers, chills, sick contacts, chest pain, or shortness of breath.     Past Medical History:   Diagnosis Date    COPD (chronic obstructive pulmonary disease) (CMS-HCC)     Diabetes mellitus (CMS-HCC)     Dysphagia     GERD (gastroesophageal reflux disease)     Hematuria     Hepatitis     Hypertension      Past Surgical History:   Procedure Laterality Date    ESOPHAGOGASTRODUODENOSCOPY N/A 11/19/2013    Procedure: ESOPHAGOGASTRODUODENOSCOPY WITH MAC;  Surgeon: Ivery Quale, MD;  Location: [facility] ENDOSCOPY;  Service: Gastroenterology;  Laterality: N/A;    FOOT SURGERY Left     HERNIA REPAIR      about 5 yrs ago       Physical Exam     Vitals:    02/16/22 1649 02/16/22 1653   BP: (!) 169/124    BP Location: Left upper arm    Patient Position: Sitting    BP Cuff Size: Regular    Pulse: 95    Resp:  20   Temp: 98.1 F (36.7 C)    TempSrc: Oral    SpO2: 98%        General:  Well appearing. No acute distress, resting comfortably.  Eyes:  Pupils reactive. EOMI. No discharge from eyes.  ENT: No oropharyngeal edema, tolerating oral secretions  Pulmonary:   Non-labored breathing. Breath sounds clear bilaterally.   Cardiac:  Regular rate and rhythm.   Abdomen:  Soft. Non-distended. Mild diffuse tenderness to  palpation. No rebound tenderness or guarding.  Musculoskeletal:  No long bone deformity. No peripheral edema  Skin:  Dry, no rashes  Neuro:  Alert and oriented x4. CN II-XII intact. Moves all four extremities to command.     Diagnostic Studies     Labs:  Please see EMR for labs obtained during this patient encounter     Radiology:  Please see EMR for images obtained during this patient encounter     ED Course and MDM     [name] is a 67 y.o. male with a history and presentation as described above in HPI.  The patient was evaluated by myself, the ED Attending Physician Dr. Raymondo Band, and R4 Dr. Randolm Idol. All management and disposition plans were discussed and agreed upon.    Patient with history of COPD, left MCA, and GERD presenting with nausea and diffuse abdominal pain. Patient not acutely ill-appearing and resting comfortably. Physical exam largely unremarkable but some mild tenderness to palpation. CBC, BMP, lipase, LFT's largely unremarkable. Patient had resolution of symptoms with mylanta and pepcid. Patient okay for discharge with acid reducer and can follow-up with PCP.    Medications received during this ED  visit:  Medications   famotidine (PF) (PEPCID) injection 20 mg (20 mg Intravenous Given 02/16/22 1705)   aluminum & magnesium hydroxide-simethicone (MYLANTA) suspension 15 mL (15 mLs Oral Given 02/16/22 1705)         Medical Decision Making  Patient with history of COPD, left MCA stroke with aphasia at baseline, HTN, GERD, presenting with nausea and abdominal pain. Physical exam largely unremarkable. Laboratory work-up unremarkable. Patient had resolution of symptoms with acid reduction medication.     Amount and/or Complexity of Data Reviewed  External Data Reviewed: notes.  Labs: ordered.     Details: CBC, BMP, Hepatic function panel, lipase,     Risk  OTC drugs.  Prescription drug management.         Impression     1. Generalized abdominal pain    2. Gastroesophageal reflux disease without esophagitis          Plan     Discharge, see DCI provided        Clayborn Heron, MD  PGY-1, Psychiatry  [facility]       Clayborn Heron, MD  Resident  02/16/22 [phone]

## 2022-02-16 NOTE — Unmapped (Signed)
ED Attending Attestation Note    Date of service:  02/16/2022    This patient was seen by the resident physician.  I have seen and examined the patient, agree with the workup, evaluation, management and diagnosis. The care plan has been discussed and I concur.     My assessment reveals a 67 y.o. male who presents with epigastric pain and nausea. Aphasia baseline per family at bedside. Soft abdomen.

## 2022-02-16 NOTE — Unmapped (Addendum)
You were seen in the emergency department for abdominal pain and nausea. Your laboratory work-up was normal and you were given acid reducing medications that improved your symptoms. You were deemed okay to discharge home.     -If your abdominal pain worsens or you begin to have more severe nausea or vomiting, please return to the ED.

## 2022-02-16 NOTE — Unmapped (Signed)
Pt verbalizes understanding of discharge instructions and has been given the opportunity to ask questions.  Due to infection precautions, patient is unable to sign at this time.

## 2022-02-16 NOTE — Unmapped (Signed)
Patient arrived to ED with complaints of nausea and weakness. Denies any issues with pain or bowel issues.

## 2022-02-16 NOTE — Unmapped (Signed)
Bed: B10U  Expected date: 02/16/22  Expected time:   Means of arrival:   Comments:  Triage - clean

## 2022-02-16 NOTE — Unmapped (Signed)
This RN introduced self to pt, Rn attempted to connect pt to monitor-pt refused saying he just got vitals downstairs. RN gave education about importance of repeat vitals, no evidence of learning at this time. RN told pt about placing IV and pt also refused and said he just needed something to fix stomach.

## 2022-03-08 ENCOUNTER — Inpatient Hospital Stay: Admit: 2022-03-08 | Discharge: 2022-03-09 | Discharge status: Against Medical Advice | Payer: Medicare (Managed Care)

## 2022-03-08 LAB — POC GLU MONITORING DEVICE: POC Glucose Monitoring Device: 153 mg/dL — ABNORMAL HIGH (ref 70–100)

## 2022-03-08 NOTE — ED Triage Notes (Signed)
Patient presenting to [facility] with significant other. Patient unable to express concerns due to hx of stroke (April 2023) and expressive aphasia. Patient's S/O reporting patient complaining of abd pain as of one hour ago.

## 2022-04-22 DIAGNOSIS — K080 Exfoliation of teeth due to systemic causes: Secondary | ICD-10-CM | POA: Diagnosis not present

## 2022-05-20 ENCOUNTER — Ambulatory Visit
Admission: EM | Admit: 2022-05-20 | Discharge: 2022-05-20 | Disposition: A | Discharge status: Home / Self Care | Payer: Medicare Other | Attending: Nurse Practitioner | Admitting: Nurse Practitioner

## 2022-05-20 DIAGNOSIS — H6993 Unspecified Eustachian tube disorder, bilateral: Secondary | ICD-10-CM

## 2022-05-20 MED ORDER — FLUTICASONE PROPIONATE 50 MCG/ACT NA SUSP: 2.0000 | Freq: Every day | NASAL | 0 refills | Status: DC

## 2022-05-20 NOTE — Discharge Instructions (Addendum)
There is no foreign body in your right ear today.  You have fluid behind both ears.  This is probably from allergies.  Please resume the Flonase to help with the fluid behind your ears.  Follow-up with your PCP later this week to recheck blood pressure and recheck your symptoms.

## 2022-05-20 NOTE — ED Provider Notes (Signed)
[facility]    CSN: [id number] Arrival date & time: 05/20/22  1540      History   Chief Complaint Chief Complaint  Patient presents with   Ear Fullness    HPI Daniel [name] is a 67 y.o. male.   Patient presents today for 4 to 5-day history of right ear fullness.  Has also been dealing with some runny and stuffy nose, headache, and pain from the right ear.  No fever, body aches or chills, cough, shortness of breath or wheezing, difficulty breathing, abdominal pain, nausea/vomiting, diarrhea, or decreased appetite.  Has been taking Tylenol for symptoms which seems to help minimally.  Reports he is concerned he may have a piece of a earplug caught in his ear because he cannot find it prior to symptoms starting.    Past Medical History:  Diagnosis Date   COPD (chronic obstructive pulmonary disease)    Hypertension     Patient Active Problem List   Diagnosis Date Noted   Hematochezia 04/02/2017   Heme + stool 04/02/2017   Rotator cuff tear 04/05/2013   Shoulder dislocation 04/05/2013   Acute respiratory failure 12/22/2012   Essential hypertension, benign 12/22/2012   COPD exacerbation 12/21/2012   Hypertriglyceridemia without hypercholesterolemia 03/12/2011   Chest pain 03/06/2011   Anxiety 03/06/2011   Obesity (BMI 30-39.9) 03/06/2011   Pre-diabetes 03/06/2011    Past Surgical History:  Procedure Laterality Date   APPENDECTOMY     COLONOSCOPY N/A 04/24/2017   Procedure: COLONOSCOPY;  Surgeon: West Bali, MD;  Location: [facility];  Service: Endoscopy;  Laterality: N/A;  1:00pm   HEMORRHOID SURGERY     HERNIA REPAIR     RLQ, umbilical       Home Medications    Prior to Admission medications   Medication Sig Start Date End Date Taking? Authorizing Provider  albuterol (PROVENTIL HFA;VENTOLIN HFA) 108 (90 BASE) MCG/ACT inhaler Inhale 2 puffs into the lungs every 4 (four) hours as needed for wheezing or shortness of breath. 12/23/12  Yes Standley Brooking, MD  albuterol (PROVENTIL) (2.5 MG/3ML) 0.083% nebulizer solution Take 2.5 mg by nebulization every 6 (six) hours as needed for wheezing or shortness of breath.   Yes [provider]  albuterol (PROVENTIL) 4 MG tablet Take 4 mg by mouth 4 (four) times daily.   Yes [provider]  enalapril (VASOTEC) 5 MG tablet Take 10 mg by mouth 2 (two) times daily.    Yes [provider]  furosemide (LASIX) 20 MG tablet Take 20 mg by mouth daily as needed for edema.  03/06/13  Yes [provider]  ibuprofen (ADVIL,MOTRIN) 200 MG tablet Take 400 mg by mouth daily as needed for headache or moderate pain.   Yes [provider]  Multiple Vitamin (MULITIVITAMIN WITH MINERALS) TABS Take 1 tablet by mouth daily.   Yes [provider]  Tamsulosin HCl (FLOMAX) 0.4 MG CAPS Take 0.4 mg by mouth at bedtime.    Yes [provider]  acetaminophen (TYLENOL) 500 MG tablet Take 1,000 mg by mouth daily as needed for moderate pain or headache.    [provider]  amLODipine (NORVASC) 5 MG tablet Take 5 mg by mouth daily.    [provider]  cetirizine (ZYRTEC) 10 MG tablet Take 1 tablet (10 mg total) by mouth daily. 06/27/21   Leath-Warren, Sadie Haber, NP  fluticasone (FLONASE) 50 MCG/ACT nasal spray Place 2 sprays into both nostrils daily. 05/20/22   Bradly Bienenstock,  Alija Riano A, NP  Glucosamine-Chondroitin (OSTEO BI-FLEX REGULAR STRENGTH PO) Take 1 tablet by mouth daily.    [provider]    Family History Family History  Problem Relation Age of Onset   Seizures Mother    Hypertension Mother    COPD Father    Colon cancer Neg Hx    Gastric cancer Neg Hx    Esophageal cancer Neg Hx     Social History Social History   Tobacco Use   Smoking status: Former    Packs/day: 0.30    Years: 40.00    Additional pack years: 0.00    Total pack years: 12.00    Types: Cigarettes    Quit date: 01/28/2013    Years since quitting: 9.3    Smokeless tobacco: Never  Substance Use Topics   Alcohol use: No   Drug use: No     Allergies   Patient has no known allergies.   Review of Systems Review of Systems Per HPI  Physical Exam Triage Vital Signs ED Triage Vitals [05/20/22 1636]  Enc Vitals Group     BP (!) 169/110     Pulse Rate 68     Resp 18     Temp 98.3 F (36.8 C)     Temp Source Oral     SpO2 93 %     Weight      Height      Head Circumference      Peak Flow      Pain Score 0     Pain Loc      Pain Edu?      Excl. in GC?    No data found.  Updated Vital Signs BP (!) 164/107 (BP Location: Right Arm)   Pulse 68   Temp 98.3 F (36.8 C) (Oral)   Resp 18   SpO2 93%   Visual Acuity Right Eye Distance:   Left Eye Distance:   Bilateral Distance:    Right Eye Near:   Left Eye Near:    Bilateral Near:     Physical Exam Vitals and nursing note reviewed.  Constitutional:      General: He is not in acute distress.    Appearance: Normal appearance. He is not ill-appearing or toxic-appearing.  HENT:     Head: Normocephalic and atraumatic.     Right Ear: Ear canal and external ear normal. A middle ear effusion is present. No foreign body.     Left Ear: Ear canal and external ear normal. A middle ear effusion is present.     Nose: No congestion or rhinorrhea.     Mouth/Throat:     Mouth: Mucous membranes are moist.     Pharynx: Oropharynx is clear. Posterior oropharyngeal erythema present. No oropharyngeal exudate.  Eyes:     General: No scleral icterus.    Extraocular Movements: Extraocular movements intact.  Cardiovascular:     Rate and Rhythm: Normal rate and regular rhythm.  Pulmonary:     Effort: Pulmonary effort is normal. No respiratory distress.     Breath sounds: Normal breath sounds. No wheezing, rhonchi or rales.  Musculoskeletal:     Cervical back: Normal range of motion and neck supple.  Lymphadenopathy:     Cervical: No cervical adenopathy.  Skin:    General: Skin is warm  and dry.     Coloration: Skin is not jaundiced or pale.     Findings: No erythema or rash.  Neurological:     Mental  Status: He is alert and oriented to person, place, and time.  Psychiatric:        Behavior: Behavior is cooperative.      UC Treatments / Results  Labs (all labs ordered are listed, but only abnormal results are displayed) Labs Reviewed - No data to display  EKG   Radiology No results found.  Procedures Procedures (including critical care time)  Medications Ordered in UC Medications - No data to display  Initial Impression / Assessment and Plan / UC Course  I have reviewed the triage vital signs and the nursing notes.  Pertinent labs & imaging results that were available during my care of the patient were reviewed by me and considered in my medical decision making (see chart for details).   Patient is well-appearing, afebrile, not tachycardic, not tachypneic, oxygenating well on room air.  Patient is mildly hypertensive today in urgent care.  He is asymptomatic and plans to follow-up with primary care provider later this week for regular follow-up.  1. Dysfunction of both eustachian tubes Reassurance provided that there is no foreign body in his right ear He has bilateral effusions, suspect related to allergies and dysfunction of both eustachian tubes Treat with Flonase nasal spray, follow-up with PCP with no improvement   The patient was given the opportunity to ask questions.  All questions answered to their satisfaction.  The patient is in agreement to this plan.    Final Clinical Impressions(s) / UC Diagnoses   Final diagnoses:  Dysfunction of both eustachian tubes     Discharge Instructions      There is no foreign body in your right ear today.  You have fluid behind both ears.  This is probably from allergies.  Please resume the Flonase to help with the fluid behind your ears.  Follow-up with your PCP later this week to recheck blood pressure  and recheck your symptoms.     ED Prescriptions     Medication Sig Dispense Auth. Provider   fluticasone (FLONASE) 50 MCG/ACT nasal spray Place 2 sprays into both nostrils daily. 16 g Valentino Nose, NP      PDMP not reviewed this encounter.   Valentino Nose, NP 05/20/22 1815

## 2022-05-20 NOTE — ED Triage Notes (Signed)
Pt states he is having pressure in right ear, states he may have a piece of ear plug in that ear but doesn't know would like to have it looked at. Also having headache that started Friday. Taking tylenol.

## 2022-05-20 NOTE — ED Triage Notes (Signed)
Pt c/o congestion and ear pain

## 2022-05-24 DIAGNOSIS — Z6835 Body mass index (BMI) 35.0-35.9, adult: Secondary | ICD-10-CM | POA: Diagnosis not present

## 2022-05-24 DIAGNOSIS — I10 Essential (primary) hypertension: Secondary | ICD-10-CM | POA: Diagnosis not present

## 2022-05-24 DIAGNOSIS — E6609 Other obesity due to excess calories: Secondary | ICD-10-CM | POA: Diagnosis not present

## 2022-05-24 DIAGNOSIS — J449 Chronic obstructive pulmonary disease, unspecified: Secondary | ICD-10-CM | POA: Diagnosis not present

## 2022-06-08 ENCOUNTER — Ambulatory Visit
Admission: EM | Admit: 2022-06-08 | Discharge: 2022-06-08 | Disposition: A | Discharge status: Home / Self Care | Payer: Medicare Other | Attending: Nurse Practitioner | Admitting: Nurse Practitioner

## 2022-06-08 DIAGNOSIS — J029 Acute pharyngitis, unspecified: Secondary | ICD-10-CM | POA: Diagnosis not present

## 2022-06-08 DIAGNOSIS — J309 Allergic rhinitis, unspecified: Secondary | ICD-10-CM | POA: Insufficient documentation

## 2022-06-08 LAB — POCT RAPID STREP A (OFFICE): Rapid Strep A Screen: NEGATIVE

## 2022-06-08 MED ORDER — LIDOCAINE VISCOUS HCL 2 % MT SOLN
5.0000 mL | OROMUCOSAL | 0 refills | Status: DC | PRN
Start: 2022-06-08 — End: 2023-02-01

## 2022-06-08 NOTE — ED Triage Notes (Signed)
Pt reports several da\ys of sore throat and ear fullness. Pt has been using nasal spray to help with symptoms.

## 2022-06-08 NOTE — Discharge Instructions (Signed)
The rapid strep test was negative.  A throat culture has been ordered.  You will be contacted if the pending test results is positive. Take medication as prescribed. Please start taking the Zyrtec that you have at home and using the Flonase daily. Warm salt water gargles 3-4 times daily while throat pain persist. May use normal saline nasal spray throughout the day to help with nasal congestion and runny nose. May take over-the-counter Tylenol as needed for pain, fever, general discomfort. If symptoms or not improving within the next 7 to 10 days, please follow-up in this clinic or with your primary care physician for further evaluation. Follow-up as needed.

## 2022-06-08 NOTE — ED Provider Notes (Signed)
[facility]    CSN: [id number] Arrival date & time: 06/08/22  0801      History   Chief Complaint No chief complaint on file.   HPI Daniel [name] is a 67 y.o. male.   The history is provided by the patient.   The patient presents with a 3-day history of bilateral ear fullness, nasal congestion, postnasal drainage, and sore throat.  He denies fever, chills, ear drainage, ear pain, cough, chest pain, abdominal pain, nausea, vomiting, or diarrhea.  Patient reports history of seasonal allergies.  Patient reports he was seen for the same or similar symptoms approximately 1 month ago.  He states the medications that he was prescribed did help with his symptoms but he stopped them.  Patient denies any obvious sick contacts.  Patient states he has not taken any medication for symptoms.  Past Medical History:  Diagnosis Date   COPD (chronic obstructive pulmonary disease) (HCC)    Hypertension     Patient Active Problem List   Diagnosis Date Noted   Hematochezia 04/02/2017   Heme + stool 04/02/2017   Rotator cuff tear 04/05/2013   Shoulder dislocation 04/05/2013   Acute respiratory failure (HCC) 12/22/2012   Essential hypertension, benign 12/22/2012   COPD exacerbation (HCC) 12/21/2012   Hypertriglyceridemia without hypercholesterolemia 03/12/2011   Chest pain 03/06/2011   Anxiety 03/06/2011   Obesity (BMI 30-39.9) 03/06/2011   Pre-diabetes 03/06/2011    Past Surgical History:  Procedure Laterality Date   APPENDECTOMY     COLONOSCOPY N/A 04/24/2017   Procedure: COLONOSCOPY;  Surgeon: West Bali, MD;  Location: [facility];  Service: Endoscopy;  Laterality: N/A;  1:00pm   HEMORRHOID SURGERY     HERNIA REPAIR     RLQ, umbilical       Home Medications    Prior to Admission medications   Medication Sig Start Date End Date Taking? Authorizing Provider  lidocaine (XYLOCAINE) 2 % solution Use as directed 5 mLs in the mouth or throat as needed for mouth pain.  Gargle and spit 5 mL as needed for throat pain. 06/08/22  Yes Khamille Beynon-Warren, Sadie Haber, NP  acetaminophen (TYLENOL) 500 MG tablet Take 1,000 mg by mouth daily as needed for moderate pain or headache.    [provider]  albuterol (PROVENTIL HFA;VENTOLIN HFA) 108 (90 BASE) MCG/ACT inhaler Inhale 2 puffs into the lungs every 4 (four) hours as needed for wheezing or shortness of breath. 12/23/12   Standley Brooking, MD  albuterol (PROVENTIL) (2.5 MG/3ML) 0.083% nebulizer solution Take 2.5 mg by nebulization every 6 (six) hours as needed for wheezing or shortness of breath.    [provider]  albuterol (PROVENTIL) 4 MG tablet Take 4 mg by mouth 4 (four) times daily.    [provider]  amLODipine (NORVASC) 5 MG tablet Take 5 mg by mouth daily.    [provider]  cetirizine (ZYRTEC) 10 MG tablet Take 1 tablet (10 mg total) by mouth daily. 06/27/21   Cadon Raczka-Warren, Sadie Haber, NP  enalapril (VASOTEC) 5 MG tablet Take 10 mg by mouth 2 (two) times daily.     [provider]  fluticasone (FLONASE) 50 MCG/ACT nasal spray Place 2 sprays into both nostrils daily. 05/20/22   Valentino Nose, NP  furosemide (LASIX) 20 MG tablet Take 20 mg by mouth daily as needed for edema.  03/06/13   [provider]  Glucosamine-Chondroitin (OSTEO BI-FLEX REGULAR STRENGTH PO) Take 1 tablet by mouth daily.  [provider]  ibuprofen (ADVIL,MOTRIN) 200 MG tablet Take 400 mg by mouth daily as needed for headache or moderate pain.    [provider]  Multiple Vitamin (MULITIVITAMIN WITH MINERALS) TABS Take 1 tablet by mouth daily.    [provider]  Tamsulosin HCl (FLOMAX) 0.4 MG CAPS Take 0.4 mg by mouth at bedtime.     [provider]    Family History Family History  Problem Relation Age of Onset   Seizures Mother    Hypertension Mother    COPD Father    Colon cancer Neg Hx    Gastric cancer Neg Hx    Esophageal cancer Neg Hx      Social History Social History   Tobacco Use   Smoking status: Former    Packs/day: 0.30    Years: 40.00    Additional pack years: 0.00    Total pack years: 12.00    Types: Cigarettes    Quit date: 01/28/2013    Years since quitting: 9.3   Smokeless tobacco: Never  Substance Use Topics   Alcohol use: No   Drug use: No     Allergies   Patient has no known allergies.   Review of Systems Review of Systems Per HPI  Physical Exam Triage Vital Signs ED Triage Vitals [06/08/22 0813]  Enc Vitals Group     BP (!) 153/81     Pulse Rate 82     Resp 18     Temp (!) 97.3 F (36.3 C)     Temp Source Oral     SpO2 98 %     Weight      Height      Head Circumference      Peak Flow      Pain Score      Pain Loc      Pain Edu?      Excl. in GC?    No data found.  Updated Vital Signs BP (!) 153/81 (BP Location: Left Arm)   Pulse 82   Temp (!) 97.3 F (36.3 C) (Oral)   Resp 18   SpO2 98%   Visual Acuity Right Eye Distance:   Left Eye Distance:   Bilateral Distance:    Right Eye Near:   Left Eye Near:    Bilateral Near:     Physical Exam Vitals and nursing note reviewed.  Constitutional:      General: He is not in acute distress.    Appearance: Normal appearance.  HENT:     Head: Normocephalic.     Right Ear: Tympanic membrane, ear canal and external ear normal.     Left Ear: Tympanic membrane, ear canal and external ear normal.     Nose: Congestion present. No rhinorrhea.     Right Turbinates: Enlarged and swollen.     Left Turbinates: Enlarged and swollen.     Right Sinus: No maxillary sinus tenderness or frontal sinus tenderness.     Left Sinus: No maxillary sinus tenderness or frontal sinus tenderness.     Mouth/Throat:     Lips: Pink.     Mouth: Mucous membranes are moist.     Pharynx: Oropharynx is clear. Uvula midline. Posterior oropharyngeal erythema present. No pharyngeal swelling, oropharyngeal exudate or uvula swelling.     Comments:  Cobblestoning present on posterior oropharynx Eyes:     Extraocular Movements: Extraocular movements intact.     Conjunctiva/sclera: Conjunctivae normal.     Pupils: Pupils are equal,  round, and reactive to light.  Cardiovascular:     Rate and Rhythm: Normal rate and regular rhythm.     Pulses: Normal pulses.     Heart sounds: Normal heart sounds.  Pulmonary:     Effort: Pulmonary effort is normal. No respiratory distress.     Breath sounds: Normal breath sounds. No stridor. No wheezing, rhonchi or rales.  Abdominal:     General: Bowel sounds are normal.     Palpations: Abdomen is soft.     Tenderness: There is no abdominal tenderness.  Musculoskeletal:     Cervical back: Normal range of motion.  Lymphadenopathy:     Cervical: No cervical adenopathy.  Skin:    General: Skin is warm and dry.  Neurological:     General: No focal deficit present.     Mental Status: He is alert and oriented to person, place, and time.  Psychiatric:        Mood and Affect: Mood normal.        Behavior: Behavior normal.      UC Treatments / Results  Labs (all labs ordered are listed, but only abnormal results are displayed) Labs Reviewed  CULTURE, GROUP A STREP [facility])  POCT RAPID STREP A (OFFICE)    EKG   Radiology No results found.  Procedures Procedures (including critical care time)  Medications Ordered in UC Medications - No data to display  Initial Impression / Assessment and Plan / UC Course  I have reviewed the triage vital signs and the nursing notes.  Pertinent labs & imaging results that were available during my care of the patient were reviewed by me and considered in my medical decision making (see chart for details).  The patient is well-appearing, he is in no acute distress, he is hypertensive, vital signs are otherwise stable.  The rapid strep test is negative, throat culture is pending.  Patient declines further viral testing.  Symptoms appear to be consistent with  allergic rhinitis.  Patient has remaining Flonase from his previous treatment.  He also states he has Zyrtec.  Will have patient continue use of Flonase and Zyrtec.  For his throat pain, viscous lidocaine 2% was prescribed.  Supportive care recommendations were provided and discussed with the patient to include warm salt water gargles, use of Tylenol, and a soft diet.  Patient was given strict follow-up precautions.  Patient is in agreement with this plan of care and verbalizes understanding.  All questions were answered.  Patient stable for discharge.   Final Clinical Impressions(s) / UC Diagnoses   Final diagnoses:  Allergic rhinitis, unspecified seasonality, unspecified trigger  Sore throat     Discharge Instructions      The rapid strep test was negative.  A throat culture has been ordered.  You will be contacted if the pending test results is positive. Take medication as prescribed. Please start taking the Zyrtec that you have at home and using the Flonase daily. Warm salt water gargles 3-4 times daily while throat pain persist. May use normal saline nasal spray throughout the day to help with nasal congestion and runny nose. May take over-the-counter Tylenol as needed for pain, fever, general discomfort. If symptoms or not improving within the next 7 to 10 days, please follow-up in this clinic or with your primary care physician for further evaluation. Follow-up as needed.     ED Prescriptions     Medication Sig Dispense Auth. Provider   lidocaine (XYLOCAINE) 2 % solution Use as directed  5 mLs in the mouth or throat as needed for mouth pain. Gargle and spit 5 mL as needed for throat pain. 100 mL Annessa Satre-Warren, Sadie Haber, NP      PDMP not reviewed this encounter.   Abran Cantor, NP 06/08/22 [id number]

## 2022-06-11 ENCOUNTER — Emergency Department (HOSPITAL_COMMUNITY)
Admission: EM | Admit: 2022-06-11 | Discharge: 2022-06-12 | Disposition: A | Discharge status: Home / Self Care | Payer: Worker's Compensation | Attending: Emergency Medicine | Admitting: Emergency Medicine

## 2022-06-11 ENCOUNTER — Emergency Department (HOSPITAL_COMMUNITY): Payer: Medicare Other

## 2022-06-11 DIAGNOSIS — S61211A Laceration without foreign body of left index finger without damage to nail, initial encounter: Secondary | ICD-10-CM

## 2022-06-11 DIAGNOSIS — S62643A Nondisplaced fracture of proximal phalanx of left middle finger, initial encounter for closed fracture: Secondary | ICD-10-CM | POA: Diagnosis not present

## 2022-06-11 DIAGNOSIS — S61215A Laceration without foreign body of left ring finger without damage to nail, initial encounter: Secondary | ICD-10-CM | POA: Insufficient documentation

## 2022-06-11 DIAGNOSIS — S6992XA Unspecified injury of left wrist, hand and finger(s), initial encounter: Secondary | ICD-10-CM | POA: Diagnosis present

## 2022-06-11 DIAGNOSIS — S61213A Laceration without foreign body of left middle finger without damage to nail, initial encounter: Secondary | ICD-10-CM

## 2022-06-11 DIAGNOSIS — S62643B Nondisplaced fracture of proximal phalanx of left middle finger, initial encounter for open fracture: Secondary | ICD-10-CM

## 2022-06-11 DIAGNOSIS — W311XXA Contact with metalworking machines, initial encounter: Secondary | ICD-10-CM | POA: Insufficient documentation

## 2022-06-11 DIAGNOSIS — Y990 Civilian activity done for income or pay: Secondary | ICD-10-CM | POA: Insufficient documentation

## 2022-06-11 LAB — CULTURE, GROUP A STREP (THRC)

## 2022-06-11 MED ORDER — IBUPROFEN 600 MG PO TABS: 600.0000 mg | ORAL_TABLET | Freq: Three times a day (TID) | ORAL | 0 refills | Status: AC | PRN

## 2022-06-11 MED ORDER — CEPHALEXIN 500 MG PO CAPS: 500.0000 mg | ORAL_CAPSULE | Freq: Four times a day (QID) | ORAL | 0 refills | Status: DC

## 2022-06-11 MED ORDER — OXYCODONE HCL 5 MG PO TABS: 5.0000 mg | ORAL_TABLET | Freq: Four times a day (QID) | ORAL | 0 refills | Status: AC | PRN

## 2022-06-11 MED ORDER — LIDOCAINE HCL (PF) 1 % IJ SOLN
30.0000 mL | Freq: Once | INTRAMUSCULAR | Status: AC
  Administered 2022-06-11: 30 mL
  Filled 2022-06-11: qty 30

## 2022-06-11 MED ORDER — OXYCODONE HCL 5 MG PO TABS: 5.0000 mg | ORAL_TABLET | Freq: Four times a day (QID) | ORAL | 0 refills | Status: DC | PRN

## 2022-06-11 MED ORDER — IBUPROFEN 600 MG PO TABS: 600.0000 mg | ORAL_TABLET | Freq: Three times a day (TID) | ORAL | 0 refills | Status: DC | PRN

## 2022-06-11 MED ORDER — CEFAZOLIN SODIUM-DEXTROSE 2-4 GM/100ML-% IV SOLN
2.0000 g | Freq: Once | INTRAVENOUS | Status: AC
  Administered 2022-06-11: 2 g via INTRAVENOUS
  Filled 2022-06-11: qty 100

## 2022-06-11 MED ORDER — CEPHALEXIN 500 MG PO CAPS: 500.0000 mg | ORAL_CAPSULE | Freq: Four times a day (QID) | ORAL | 0 refills | Status: AC

## 2022-06-11 MED ORDER — OXYCODONE-ACETAMINOPHEN 5-325 MG PO TABS
2.0000 | ORAL_TABLET | Freq: Once | ORAL | Status: AC
  Administered 2022-06-11: 2 via ORAL
  Filled 2022-06-11: qty 2

## 2022-06-11 NOTE — ED Notes (Signed)
Charge nurse notified of need for room d/t potential open fx. Pt taken to [facility]

## 2022-06-11 NOTE — ED Provider Triage Note (Signed)
Emergency Medicine Provider Triage Evaluation Note  Daniel [name] , a 67 y.o. male  was evaluated in triage.  Pt complains of laceration to the left middle and ring finger. Patient was at work about 2 hours ago when he cut himself with a saw. Denies  blood thinner use.    Review of Systems  Positive: Laceration to left middle finger and ring finger Negative: Loss of sensation, inability to move injured fingers  Physical Exam  BP (!) 156/89 (BP Location: Right Arm)   Pulse 70   Temp 99.5 F (37.5 C) (Oral)   Resp 18   SpO2 94%  Gen:   Awake, no distress   Resp:  Normal effort  MSK:   Pain to movement of third and fourth digits on left hand, otherwise, moves extremities without difficulty  Other:  Capillary refill of injured hand <2 sec  Medical Decision Making  Medically screening exam initiated at 4:32 PM.  Appropriate orders placed.  Daniel [name] was informed that the remainder of the evaluation will be completed by another provider, this initial triage assessment does not replace that evaluation, and the importance of remaining in the ED until their evaluation is complete.     Maxwell Marion, PA-C 06/11/22 1642

## 2022-06-11 NOTE — ED Triage Notes (Signed)
Pt was using a saw at work earlier today and cut his middle and ring finger on a saw. Bleeding controlled. Pt seen at [facility] prior to arrival and updated tetanus.

## 2022-06-11 NOTE — ED Provider Notes (Signed)
[place] [facility] AT [facility] Provider Note   CSN: [id number] Arrival date & time: 06/11/22  1612     History  Chief Complaint  Patient presents with   Finger Laceration    Colyn Angrisani is a 67 y.o. male who presents to the ED with injury to the left hand.  He states that he was at work using a saw when it slipped and cut into his left middle and left ring finger.  He mostly has pain to the left middle finger with difficulty extending it.  Tetanus updated at urgent care prior to arrival.  Patient right-hand-dominant.  He denies numbness or tingling.  He does not take anticoagulants.    Home Medications Prior to Admission medications   Medication Sig Start Date End Date Taking? Authorizing Provider  acetaminophen (TYLENOL) 500 MG tablet Take 1,000 mg by mouth daily as needed for moderate pain or headache.    [provider]  albuterol (PROVENTIL HFA;VENTOLIN HFA) 108 (90 BASE) MCG/ACT inhaler Inhale 2 puffs into the lungs every 4 (four) hours as needed for wheezing or shortness of breath. 12/23/12   Standley Brooking, MD  albuterol (PROVENTIL) (2.5 MG/3ML) 0.083% nebulizer solution Take 2.5 mg by nebulization every 6 (six) hours as needed for wheezing or shortness of breath.    [provider]  albuterol (PROVENTIL) 4 MG tablet Take 4 mg by mouth 4 (four) times daily.    [provider]  amLODipine (NORVASC) 5 MG tablet Take 5 mg by mouth daily.    [provider]  cephALEXin (KEFLEX) 500 MG capsule Take 1 capsule (500 mg total) by mouth 4 (four) times daily for 5 days. 06/11/22 06/16/22  Jarelyn Bambach L, PA-C  cetirizine (ZYRTEC) 10 MG tablet Take 1 tablet (10 mg total) by mouth daily. 06/27/21   Leath-Warren, Sadie Haber, NP  enalapril (VASOTEC) 5 MG tablet Take 10 mg by mouth 2 (two) times daily.     [provider]  fluticasone (FLONASE) 50 MCG/ACT nasal spray Place 2 sprays into both nostrils daily. 05/20/22    Valentino Nose, NP  furosemide (LASIX) 20 MG tablet Take 20 mg by mouth daily as needed for edema.  03/06/13   [provider]  Glucosamine-Chondroitin (OSTEO BI-FLEX REGULAR STRENGTH PO) Take 1 tablet by mouth daily.    [provider]  ibuprofen (ADVIL) 600 MG tablet Take 1 tablet (600 mg total) by mouth every 8 (eight) hours as needed for up to 3 days for mild pain or moderate pain. 06/11/22 06/14/22  Nickey Canedo L, PA-C  lidocaine (XYLOCAINE) 2 % solution Use as directed 5 mLs in the mouth or throat as needed for mouth pain. Gargle and spit 5 mL as needed for throat pain. 06/08/22   Leath-Warren, Sadie Haber, NP  Multiple Vitamin (MULITIVITAMIN WITH MINERALS) TABS Take 1 tablet by mouth daily.    [provider]  oxyCODONE (ROXICODONE) 5 MG immediate release tablet Take 1 tablet (5 mg total) by mouth every 6 (six) hours as needed for up to 3 days for severe pain or breakthrough pain. 06/11/22 06/14/22  Baruc Tugwell L, PA-C  Tamsulosin HCl (FLOMAX) 0.4 MG CAPS Take 0.4 mg by mouth at bedtime.     [provider]      Allergies    Patient has no known allergies.    Review of Systems   Review of Systems  All other systems reviewed and are negative.   Physical Exam Updated  Vital Signs BP (!) 140/90   Pulse 61   Temp 98.4 F (36.9 C) (Oral)   Resp 17   SpO2 96%  Physical Exam Vitals and nursing note reviewed.  Constitutional:      General: He is not in acute distress.    Appearance: Normal appearance.  HENT:     Head: Normocephalic and atraumatic.     Mouth/Throat:     Mouth: Mucous membranes are moist.  Eyes:     Conjunctiva/sclera: Conjunctivae normal.  Cardiovascular:     Rate and Rhythm: Normal rate and regular rhythm.     Heart sounds: No murmur heard. Pulmonary:     Effort: Pulmonary effort is normal.     Breath sounds: Normal breath sounds.  Abdominal:     General: Abdomen is flat.     Palpations: Abdomen is soft.   Musculoskeletal:     Cervical back: Neck supple.     Comments: Approximately 3 cm linear laceration to the dorsal aspect of the left third finger, minimal oozing blood, approximately 2.5 cm irregular laceration to the dorsal aspect of the left ring finger without active bleeding, sensation intact to hand, patient with difficulty with full extension of the left middle finger at the PIP joint, 2+ radial pulse, remainder of hand non-tender and without obvious deformity  Skin:    General: Skin is warm and dry.     Capillary Refill: Capillary refill takes less than 2 seconds.  Neurological:     Mental Status: He is alert. Mental status is at baseline.  Psychiatric:        Behavior: Behavior normal.     ED Results / Procedures / Treatments   Labs (all labs ordered are listed, but only abnormal results are displayed) Labs Reviewed - No data to display  EKG None  Radiology DG Hand Complete Left  Result Date: 06/11/2022 CLINICAL DATA:  Left hand laceration EXAM: LEFT HAND - COMPLETE 3+ VIEW COMPARISON:  None Available. FINDINGS: Oblique mid diaphyseal fracture of the proximal phalanx of the third digit noted with fracture fragments in near anatomic alignment. Mild periarticular soft tissue swelling surrounding the PIP joint of the ring finger. Remote healed fracture of the third DIP with mild residual deformity. Joint spaces are preserved. Soft tissues are otherwise unremarkable. IMPRESSION: 1. Oblique mid diaphyseal fracture of the proximal phalanx of the third digit in near anatomic alignment. Electronically Signed   By: Helyn Numbers M.D.   On: 06/11/2022 17:35    Procedures .Marland KitchenLaceration Repair  Date/Time: 06/11/2022 9:45 PM  Performed by: Tonette Lederer, PA-C Authorized by: Tonette Lederer, PA-C   Consent:    Consent obtained:  Verbal   Consent given by:  Patient   Risks, benefits, and alternatives were discussed: yes     Risks discussed:  Infection, pain, retained foreign body,  poor cosmetic result, poor wound healing, need for additional repair and vascular damage   Alternatives discussed:  No treatment, delayed treatment, observation and referral Universal protocol:    Procedure explained and questions answered to patient or proxy's satisfaction: yes     Immediately prior to procedure, a time out was called: yes     Patient identity confirmed:  Verbally with patient Anesthesia:    Anesthesia method:  Local infiltration   Local anesthetic:  Lidocaine 1% w/o epi Laceration details:    Location:  Finger   Finger location:  L ring finger   Length (cm):  2.5 Pre-procedure details:    Preparation:  Patient was prepped and draped in usual sterile fashion and imaging obtained to evaluate for foreign bodies Exploration:    Hemostasis achieved with:  Direct pressure   Imaging obtained: x-ray     Imaging outcome: foreign body not noted     Wound exploration: wound explored through full range of motion and entire depth of wound visualized     Contaminated: no   Treatment:    Area cleansed with:  Povidone-iodine and saline   Amount of cleaning:  Extensive   Irrigation solution:  Sterile saline   Irrigation method:  Syringe and tap   Visualized foreign bodies/material removed: no     Debridement:  None Skin repair:    Repair method:  Sutures   Suture size:  4-0   Suture material:  Nylon   Suture technique:  Simple interrupted   Number of sutures:  4 Approximation:    Approximation:  Loose Repair type:    Repair type:  Intermediate Post-procedure details:    Dressing:  Antibiotic ointment and non-adherent dressing   Procedure completion:  Tolerated well, no immediate complications .Marland KitchenLaceration Repair  Date/Time: 06/11/2022 10:00 PM  Performed by: Tonette Lederer, PA-C Authorized by: Tonette Lederer, PA-C   Consent:    Consent obtained:  Verbal   Consent given by:  Patient   Risks, benefits, and alternatives were discussed: yes     Risks discussed:   Infection, pain, retained foreign body, poor cosmetic result, need for additional repair and poor wound healing   Alternatives discussed:  No treatment, delayed treatment, observation and referral Universal protocol:    Procedure explained and questions answered to patient or proxy's satisfaction: yes     Immediately prior to procedure, a time out was called: yes     Patient identity confirmed:  Verbally with patient Anesthesia:    Anesthesia method:  Local infiltration   Local anesthetic:  Lidocaine 1% w/o epi Laceration details:    Location:  Finger   Finger location:  L long finger   Length (cm):  3 Pre-procedure details:    Preparation:  Patient was prepped and draped in usual sterile fashion and imaging obtained to evaluate for foreign bodies Exploration:    Hemostasis achieved with:  Direct pressure   Imaging outcome: foreign body not noted     Wound exploration: wound explored through full range of motion and entire depth of wound visualized   Treatment:    Area cleansed with:  Povidone-iodine and saline   Amount of cleaning:  Extensive   Irrigation solution:  Sterile saline   Irrigation method:  Syringe and tap   Visualized foreign bodies/material removed: no   Skin repair:    Repair method:  Sutures   Suture size:  4-0   Suture material:  Nylon   Suture technique:  Simple interrupted   Number of sutures:  4 Approximation:    Approximation:  Loose Repair type:    Repair type:  Intermediate Post-procedure details:    Dressing:  Antibiotic ointment and non-adherent dressing   Procedure completion:  Tolerated well, no immediate complications     Medications Ordered in ED Medications  oxyCODONE-acetaminophen (PERCOCET/ROXICET) 5-325 MG per tablet 2 tablet (2 tablets Oral Given 06/11/22 1928)  ceFAZolin (ANCEF) IVPB 2g/100 mL premix (0 g Intravenous Stopped 06/11/22 2010)  lidocaine (PF) (XYLOCAINE) 1 % injection 30 mL (30 mLs Infiltration Given 06/11/22 2253)    ED  Course/ Medical Decision Making/ A&P  Medical Decision Making Amount and/or Complexity of Data Reviewed Radiology: ordered. Decision-making details documented in ED Course.  Risk Prescription drug management.   Medical Decision Making:   Leam Saye is a 67 y.o. male who presented to the ED today with laceration detailed above.    Additional history discussed with patient's family/caregivers.  Patient's presentation is complicated by their history of saw injury.  Complete initial physical exam performed, notably the patient  was with 2 lacerations, one each to left middle and left ring finger. Sensation intact. Difficulty with extension of left third digit.    Reviewed and confirmed nursing documentation for past medical history, family history, social history.    Initial Assessment:   With the patient's presentation of laceration, differential diagnosis includes but is not limited to simple vs intermediate vs complex laceration, tendon injury, nerve injury, retained foreign body, fracture, dislocation, wound infection, skin tear, abrasion.  This is most consistent with an acute complicated illness  Initial Plan:  Tdap booster up to date and/or given during today's visit (updated at urgent care per patient) Extensive irrigation performed  Pain management as needed Laceration repair as indicated XR to evaluate for bony pathology and/or retained foreign body Objective evaluation as reviewed   Initial Study Results:   Radiology:  All images reviewed independently. Agree with radiology report at this time.   DG Hand Complete Left  Result Date: 06/11/2022 CLINICAL DATA:  Left hand laceration EXAM: LEFT HAND - COMPLETE 3+ VIEW COMPARISON:  None Available. FINDINGS: Oblique mid diaphyseal fracture of the proximal phalanx of the third digit noted with fracture fragments in near anatomic alignment. Mild periarticular soft tissue swelling surrounding the PIP joint  of the ring finger. Remote healed fracture of the third DIP with mild residual deformity. Joint spaces are preserved. Soft tissues are otherwise unremarkable. IMPRESSION: 1. Oblique mid diaphyseal fracture of the proximal phalanx of the third digit in near anatomic alignment. Electronically Signed   By: Helyn Numbers M.D.   On: 06/11/2022 17:35      Consults: Case discussed with Dr. Frazier Butt with hand surgery who recommended loose repair, splint in extension, and follow up in office on Friday.   Final Assessment and Plan:   Laceration occurred < 8 hours prior to repair which was well tolerated. Pt has no co morbidities to affect normal wound healing. Discussed suture home care with pt/wife and answered questions.  Discussed case with hand surgery as well as provided photographs of injuries and they recommended loose closure of lacerations and have patient closely follow-up in the office on Friday.  Patient agreeable with this plan.  Do believe that patient likely has an underlying extensor tendon injury with restricted range of motion of the middle finger.  Nondisplaced fracture of the proximal phalanx also noted.  Extensive irrigation as well as laceration performed as above.  Affected digits splinted in extension by Ortho tech.  Pt is hemodynamically stable with no complaints prior to discharge. Strict ED return precautions given for wound complications, infection, recurrent injury, etc. Ancef given.  Will place on Keflex pending hand surgery follow-up.  All questions answered and patient stable at time of discharge.   Clinical Impression:  1. Nondisplaced fracture of proximal phalanx of left middle finger, initial encounter for open fracture   2. Laceration of left middle finger without foreign body without damage to nail, initial encounter   3. Laceration of left index finger without foreign body without damage to nail, initial encounter  Discharge           Final Clinical  Impression(s) / ED Diagnoses Final diagnoses:  Nondisplaced fracture of proximal phalanx of left middle finger, initial encounter for open fracture  Laceration of left middle finger without foreign body without damage to nail, initial encounter  Laceration of left index finger without foreign body without damage to nail, initial encounter    Rx / DC Orders ED Discharge Orders          Ordered    cephALEXin (KEFLEX) 500 MG capsule  4 times daily,   Status:  Discontinued        06/11/22 2000    oxyCODONE (ROXICODONE) 5 MG immediate release tablet  Every 6 hours PRN,   Status:  Discontinued        06/11/22 2015    ibuprofen (ADVIL) 600 MG tablet  Every 8 hours PRN,   Status:  Discontinued        06/11/22 2015    cephALEXin (KEFLEX) 500 MG capsule  4 times daily        06/11/22 2318    ibuprofen (ADVIL) 600 MG tablet  Every 8 hours PRN        06/11/22 2318    oxyCODONE (ROXICODONE) 5 MG immediate release tablet  Every 6 hours PRN        06/11/22 2318              Tonette Lederer, PA-C 06/12/22 1554    Jacalyn Lefevre, MD 06/14/22 [phone]

## 2022-06-11 NOTE — Discharge Instructions (Addendum)
You have a fracture of your middle finger. We sutured the lacerations on your middle and ring finger. Each was loosely sutured with 4 sutures placed. Please care for these at home as instructed.   Call the hand surgeon's office tomorrow to schedule a follow-up appointment for Friday.  Keep your hand in the splint that was placed until this time.  I sent in a prescription for an antibiotic and pain medication to help with your symptoms until your follow-up.  For any new or worsening symptoms or new injury, please return to nearest ED for re-evaluation.

## 2022-06-12 ENCOUNTER — Other Ambulatory Visit (HOSPITAL_COMMUNITY): Payer: Self-pay

## 2022-06-12 NOTE — ED Notes (Signed)
Pt provided with AVS.  Education complete; all questions answered.  Pt leaving ED in stable condition at this time, ambulatory with all belongings. 

## 2022-06-12 NOTE — ED Notes (Signed)
Wounds irrigated.  Provider notified of same.  Suture cart at bedside.

## 2022-06-12 NOTE — ED Provider Notes (Incomplete)
[place] [facility] AT [facility] Provider Note   CSN: [id number] Arrival date & time: 06/11/22  1612     History {Add pertinent medical, surgical, social history, OB history to HPI:1} Chief Complaint  Patient presents with  . Finger Laceration    Daniel [name] is a 67 y.o. male who presents to the ED with injury to the left hand.  He states that he was at work using a saw when it slipped and cut into his left middle and left ring finger.  He mostly has pain to the left middle finger with difficulty extending it.  Tetanus updated at urgent care prior to arrival.  Patient right-hand-dominant.  He denies numbness or tingling.  He does not take anticoagulants.    Home Medications Prior to Admission medications   Medication Sig Start Date End Date Taking? Authorizing Provider  acetaminophen (TYLENOL) 500 MG tablet Take 1,000 mg by mouth daily as needed for moderate pain or headache.    [provider]  albuterol (PROVENTIL HFA;VENTOLIN HFA) 108 (90 BASE) MCG/ACT inhaler Inhale 2 puffs into the lungs every 4 (four) hours as needed for wheezing or shortness of breath. 12/23/12   Standley Brooking, MD  albuterol (PROVENTIL) (2.5 MG/3ML) 0.083% nebulizer solution Take 2.5 mg by nebulization every 6 (six) hours as needed for wheezing or shortness of breath.    [provider]  albuterol (PROVENTIL) 4 MG tablet Take 4 mg by mouth 4 (four) times daily.    [provider]  amLODipine (NORVASC) 5 MG tablet Take 5 mg by mouth daily.    [provider]  cetirizine (ZYRTEC) 10 MG tablet Take 1 tablet (10 mg total) by mouth daily. 06/27/21   Leath-Warren, Sadie Haber, NP  enalapril (VASOTEC) 5 MG tablet Take 10 mg by mouth 2 (two) times daily.     [provider]  fluticasone (FLONASE) 50 MCG/ACT nasal spray Place 2 sprays into both nostrils daily. 05/20/22   Valentino Nose, NP  furosemide (LASIX) 20 MG tablet Take 20 mg by mouth daily  as needed for edema.  03/06/13   [provider]  Glucosamine-Chondroitin (OSTEO BI-FLEX REGULAR STRENGTH PO) Take 1 tablet by mouth daily.    [provider]  ibuprofen (ADVIL,MOTRIN) 200 MG tablet Take 400 mg by mouth daily as needed for headache or moderate pain.    [provider]  lidocaine (XYLOCAINE) 2 % solution Use as directed 5 mLs in the mouth or throat as needed for mouth pain. Gargle and spit 5 mL as needed for throat pain. 06/08/22   Leath-Warren, Sadie Haber, NP  Multiple Vitamin (MULITIVITAMIN WITH MINERALS) TABS Take 1 tablet by mouth daily.    [provider]  Tamsulosin HCl (FLOMAX) 0.4 MG CAPS Take 0.4 mg by mouth at bedtime.     [provider]      Allergies    Patient has no known allergies.    Review of Systems   Review of Systems  All other systems reviewed and are negative.   Physical Exam Updated Vital Signs BP (!) 143/92 (BP Location: Right Arm)   Pulse (!) 59   Temp 98.4 F (36.9 C) (Oral)   Resp 18   SpO2 96%  Physical Exam Vitals and nursing note reviewed.  Constitutional:      General: He is not in acute distress.    Appearance: Normal appearance.  HENT:     Head: Normocephalic and atraumatic.  Mouth/Throat:     Mouth: Mucous membranes are moist.  Eyes:     Conjunctiva/sclera: Conjunctivae normal.  Cardiovascular:     Rate and Rhythm: Normal rate and regular rhythm.     Heart sounds: No murmur heard. Pulmonary:     Effort: Pulmonary effort is normal.     Breath sounds: Normal breath sounds.  Abdominal:     General: Abdomen is flat.     Palpations: Abdomen is soft.  Musculoskeletal:     Cervical back: Neck supple.     Comments: Approximately 3 cm linear laceration to the dorsal aspect of the left third finger, minimal oozing blood, approximately 2.5 cm irregular laceration to the dorsal aspect of the left ring finger without active bleeding, sensation intact to hand, patient with difficulty with  full extension of the left middle finger at the PIP joint, 2+ radial pulse, remainder of hand non-tender and without obvious deformity  Skin:    General: Skin is warm and dry.     Capillary Refill: Capillary refill takes less than 2 seconds.  Neurological:     Mental Status: He is alert. Mental status is at baseline.  Psychiatric:        Behavior: Behavior normal.     ED Results / Procedures / Treatments   Labs (all labs ordered are listed, but only abnormal results are displayed) Labs Reviewed - No data to display  EKG None  Radiology DG Hand Complete Left  Result Date: 06/11/2022 CLINICAL DATA:  Left hand laceration EXAM: LEFT HAND - COMPLETE 3+ VIEW COMPARISON:  None Available. FINDINGS: Oblique mid diaphyseal fracture of the proximal phalanx of the third digit noted with fracture fragments in near anatomic alignment. Mild periarticular soft tissue swelling surrounding the PIP joint of the ring finger. Remote healed fracture of the third DIP with mild residual deformity. Joint spaces are preserved. Soft tissues are otherwise unremarkable. IMPRESSION: 1. Oblique mid diaphyseal fracture of the proximal phalanx of the third digit in near anatomic alignment. Electronically Signed   By: Helyn Numbers M.D.   On: 06/11/2022 17:35    Procedures .Marland KitchenLaceration Repair  Date/Time: 06/11/2022 9:45 PM  Performed by: Tonette Lederer, PA-C Authorized by: Tonette Lederer, PA-C   Consent:    Consent obtained:  Verbal   Consent given by:  Patient   Risks, benefits, and alternatives were discussed: yes     Risks discussed:  Infection, pain, retained foreign body, poor cosmetic result, poor wound healing, need for additional repair and vascular damage   Alternatives discussed:  No treatment, delayed treatment, observation and referral Universal protocol:    Procedure explained and questions answered to patient or proxy's satisfaction: yes     Immediately prior to procedure, a time out was  called: yes     Patient identity confirmed:  Verbally with patient Anesthesia:    Anesthesia method:  Local infiltration   Local anesthetic:  Lidocaine 1% w/o epi .Marland KitchenLaceration Repair  Date/Time: 06/11/2022 8:12 PM  Performed by: Tonette Lederer, PA-C Authorized by: Tonette Lederer, PA-C   Consent:    Consent obtained:  Verbal   Consent given by:  Patient   {Document cardiac monitor, telemetry assessment procedure when appropriate:1}  Medications Ordered in ED Medications - No data to display  ED Course/ Medical Decision Making/ A&P   {   Click here for ABCD2, HEART and other calculatorsREFRESH Note before signing :1}  Medical Decision Making Amount and/or Complexity of Data Reviewed Radiology: ordered. Decision-making details documented in ED Course.  Risk Prescription drug management.   Medical Decision Making:   Daniel [name] is a 67 y.o. male who presented to the ED today with laceration detailed above.    Additional history discussed with patient's family/caregivers.  Patient's presentation is complicated by their history of saw injury.  Complete initial physical exam performed, notably the patient  was with 2 lacerations, one each to left middle and left ring finger. Sensation intact. Difficulty with extension of left third digit.    Reviewed and confirmed nursing documentation for past medical history, family history, social history.    Initial Assessment:   With the patient's presentation of laceration, differential diagnosis includes but is not limited to simple vs intermediate vs complex laceration, tendon injury, nerve injury, retained foreign body, fracture, dislocation, wound infection, skin tear, abrasion.  This is most consistent with an acute complicated illness  Initial Plan:  Tdap booster up to date and/or given during today's visit Extensive irrigation performed  Pain management as needed Laceration repair as indicated XR to  evaluate for bony pathology and/or retained foreign body Objective evaluation as reviewed   Initial Study Results:   Radiology:  All images reviewed independently. Agree with radiology report at this time.   DG Hand Complete Left  Result Date: 06/11/2022 CLINICAL DATA:  Left hand laceration EXAM: LEFT HAND - COMPLETE 3+ VIEW COMPARISON:  None Available. FINDINGS: Oblique mid diaphyseal fracture of the proximal phalanx of the third digit noted with fracture fragments in near anatomic alignment. Mild periarticular soft tissue swelling surrounding the PIP joint of the ring finger. Remote healed fracture of the third DIP with mild residual deformity. Joint spaces are preserved. Soft tissues are otherwise unremarkable. IMPRESSION: 1. Oblique mid diaphyseal fracture of the proximal phalanx of the third digit in near anatomic alignment. Electronically Signed   By: Helyn Numbers M.D.   On: 06/11/2022 17:35      Consults: Case discussed with ***.   Final Assessment and Plan:   Laceration occurred < 8 hours prior to repair which was well tolerated. Pt has no co morbidities to affect normal wound healing. Discussed suture home care with pt and answered questions. Pt to follow up for wound check and suture removal in 7 days or earlier for any concerns. Pt is hemodynamically stable with no complaints prior to discharge. Strict ED return precautions given for wound complications, infection, recurrent injury, etc.     Clinical Impression: No diagnosis found.   Data Unavailable    {Document critical care time when appropriate:1} {Document review of labs and clinical decision tools ie heart score, Chads2Vasc2 etc:1}  {Document your independent review of radiology images, and any outside records:1} {Document your discussion with family members, caretakers, and with consultants:1} {Document social determinants of health affecting pt's care:1} {Document your decision making why or why not admission,  treatments were needed:1} Final Clinical Impression(s) / ED Diagnoses Final diagnoses:  None    Rx / DC Orders ED Discharge Orders     None

## 2022-07-01 DIAGNOSIS — I10 Essential (primary) hypertension: Secondary | ICD-10-CM | POA: Diagnosis not present

## 2022-07-01 DIAGNOSIS — H524 Presbyopia: Secondary | ICD-10-CM | POA: Diagnosis not present

## 2022-07-09 ENCOUNTER — Ambulatory Visit
Admission: EM | Admit: 2022-07-09 | Discharge: 2022-07-09 | Disposition: A | Discharge status: Home / Self Care | Payer: Medicare Other | Attending: Nurse Practitioner | Admitting: Nurse Practitioner

## 2022-07-09 ENCOUNTER — Encounter: Payer: Self-pay | Admitting: Emergency Medicine

## 2022-07-09 ENCOUNTER — Other Ambulatory Visit: Payer: Self-pay

## 2022-07-09 DIAGNOSIS — J309 Allergic rhinitis, unspecified: Secondary | ICD-10-CM

## 2022-07-09 DIAGNOSIS — J441 Chronic obstructive pulmonary disease with (acute) exacerbation: Secondary | ICD-10-CM

## 2022-07-09 MED ORDER — PREDNISONE 20 MG PO TABS: 40.0000 mg | ORAL_TABLET | Freq: Every day | ORAL | 0 refills | Status: AC

## 2022-07-09 MED ORDER — AZITHROMYCIN 250 MG PO TABS: ORAL_TABLET | ORAL | 0 refills | Status: DC

## 2022-07-09 MED ORDER — IPRATROPIUM-ALBUTEROL 0.5-2.5 (3) MG/3ML IN SOLN
3.0000 mL | Freq: Once | RESPIRATORY_TRACT | Status: AC
  Administered 2022-07-09: 3 mL via RESPIRATORY_TRACT

## 2022-07-09 MED ORDER — FLUTICASONE PROPIONATE 50 MCG/ACT NA SUSP: 2.0000 | Freq: Every day | NASAL | 0 refills | Status: DC

## 2022-07-09 NOTE — Discharge Instructions (Addendum)
We gave you a DuoNeb today which helped increase your air movement to your lungs.  Please start the oral prednisone at home to help with lung inflammation as well as the azithromycin.  Continue home inhaler as well as albuterol inhaler every 4-6 hours as needed for wheezing/shortness of breath.  Resume Flonase to help with the fluid behind your ears.  Seek care for persistent/worsening symptoms despite treatment.

## 2022-07-09 NOTE — ED Triage Notes (Addendum)
Pt reports productive cough, intermittent dyspnea since Sunday night. Pt reports history of COPD. NAD noted.

## 2022-07-09 NOTE — ED Provider Notes (Signed)
[facility]    CSN: [id number] Arrival date & time: 07/09/22  0841      History   Chief Complaint Chief Complaint  Patient presents with   Cough    HPI Daniel [name] is a 67 y.o. male.   Patient presents today for 2-day history of congested cough, pain in his rib cage after coughing, shortness of breath that is near his baseline, coughing up green, thick mucus, runny and stuffy nose, and sore throat from coughing so much.  He denies fever, body aches or chills, chest tightness, sore throat at rest, shortness of breath at rest, chest pain at rest.  Also denies headache, ear pain, abdominal pain, nausea/vomiting, diarrhea, decreased appetite, and fatigue.  Has been using rescue albuterol inhaler with improvement in symptoms temporarily.  Also taking Stiolto inhaler daily as prescribed.  Reports history of COPD.     Past Medical History:  Diagnosis Date   COPD (chronic obstructive pulmonary disease) (HCC)    Hypertension     Patient Active Problem List   Diagnosis Date Noted   Hematochezia 04/02/2017   Heme + stool 04/02/2017   Rotator cuff tear 04/05/2013   Shoulder dislocation 04/05/2013   Acute respiratory failure (HCC) 12/22/2012   Essential hypertension, benign 12/22/2012   COPD exacerbation (HCC) 12/21/2012   Hypertriglyceridemia without hypercholesterolemia 03/12/2011   Chest pain 03/06/2011   Anxiety 03/06/2011   Obesity (BMI 30-39.9) 03/06/2011   Pre-diabetes 03/06/2011    Past Surgical History:  Procedure Laterality Date   APPENDECTOMY     COLONOSCOPY N/A 04/24/2017   Procedure: COLONOSCOPY;  Surgeon: West Bali, MD;  Location: [facility];  Service: Endoscopy;  Laterality: N/A;  1:00pm   DG 3RD DIGIT LEFT HAND     ligament torn, surgical repair of middle finger as well.   HEMORRHOID SURGERY     HERNIA REPAIR     RLQ, umbilical       Home Medications    Prior to Admission medications   Medication Sig Start Date End Date Taking?  Authorizing Provider  azithromycin (ZITHROMAX) 250 MG tablet Take (2) tablets by mouth on day 1, then take (1) tablet by mouth on days 2-5. 07/09/22  Yes Valentino Nose, NP  predniSONE (DELTASONE) 20 MG tablet Take 2 tablets (40 mg total) by mouth daily with breakfast for 5 days. 07/09/22 07/14/22 Yes Valentino Nose, NP  acetaminophen (TYLENOL) 500 MG tablet Take 1,000 mg by mouth daily as needed for moderate pain or headache.    [provider]  albuterol (PROVENTIL HFA;VENTOLIN HFA) 108 (90 BASE) MCG/ACT inhaler Inhale 2 puffs into the lungs every 4 (four) hours as needed for wheezing or shortness of breath. 12/23/12   Standley Brooking, MD  albuterol (PROVENTIL) (2.5 MG/3ML) 0.083% nebulizer solution Take 2.5 mg by nebulization every 6 (six) hours as needed for wheezing or shortness of breath.    [provider]  albuterol (PROVENTIL) 4 MG tablet Take 4 mg by mouth 4 (four) times daily.    [provider]  amLODipine (NORVASC) 5 MG tablet Take 5 mg by mouth daily.    [provider]  cetirizine (ZYRTEC) 10 MG tablet Take 1 tablet (10 mg total) by mouth daily. 06/27/21   Leath-Warren, Sadie Haber, NP  enalapril (VASOTEC) 5 MG tablet Take 10 mg by mouth 2 (two) times daily.     [provider]  fluticasone (FLONASE) 50 MCG/ACT nasal spray Place 2 sprays into both nostrils daily.  07/09/22   Valentino Nose, NP  furosemide (LASIX) 20 MG tablet Take 20 mg by mouth daily as needed for edema.  03/06/13   [provider]  Glucosamine-Chondroitin (OSTEO BI-FLEX REGULAR STRENGTH PO) Take 1 tablet by mouth daily.    [provider]  lidocaine (XYLOCAINE) 2 % solution Use as directed 5 mLs in the mouth or throat as needed for mouth pain. Gargle and spit 5 mL as needed for throat pain. 06/08/22   Leath-Warren, Sadie Haber, NP  Multiple Vitamin (MULITIVITAMIN WITH MINERALS) TABS Take 1 tablet by mouth daily.    [provider]   Tamsulosin HCl (FLOMAX) 0.4 MG CAPS Take 0.4 mg by mouth at bedtime.     [provider]    Family History Family History  Problem Relation Age of Onset   Seizures Mother    Hypertension Mother    COPD Father    Colon cancer Neg Hx    Gastric cancer Neg Hx    Esophageal cancer Neg Hx     Social History Social History   Tobacco Use   Smoking status: Former    Packs/day: 0.30    Years: 40.00    Additional pack years: 0.00    Total pack years: 12.00    Types: Cigarettes    Quit date: 01/28/2013    Years since quitting: 9.4   Smokeless tobacco: Never  Substance Use Topics   Alcohol use: No   Drug use: No     Allergies   Patient has no known allergies.   Review of Systems Review of Systems Per HPI  Physical Exam Triage Vital Signs ED Triage Vitals  Enc Vitals Group     BP 07/09/22 0849 129/88     Pulse Rate 07/09/22 0849 72     Resp 07/09/22 0849 20     Temp 07/09/22 0849 97.8 F (36.6 C)     Temp Source 07/09/22 0849 Oral     SpO2 07/09/22 0849 93 %     Weight --      Height --      Head Circumference --      Peak Flow --      Pain Score 07/09/22 0850 0     Pain Loc --      Pain Edu? --      Excl. in GC? --    No data found.  Updated Vital Signs BP 129/88 (BP Location: Right Arm)   Pulse 72   Temp 97.8 F (36.6 C) (Oral)   Resp 20   SpO2 93%   Visual Acuity Right Eye Distance:   Left Eye Distance:   Bilateral Distance:    Right Eye Near:   Left Eye Near:    Bilateral Near:     Physical Exam Vitals and nursing note reviewed.  Constitutional:      General: He is not in acute distress.    Appearance: Normal appearance. He is not ill-appearing or toxic-appearing.  HENT:     Head: Normocephalic and atraumatic.     Right Ear: Ear canal and external ear normal. A middle ear effusion is present.     Left Ear: Ear canal and external ear normal. A middle ear effusion is present.     Nose: No congestion or rhinorrhea.      Mouth/Throat:     Mouth: Mucous membranes are moist.     Pharynx: Oropharynx is clear. Posterior oropharyngeal erythema present. No oropharyngeal exudate.  Eyes:  General: No scleral icterus.    Extraocular Movements: Extraocular movements intact.  Cardiovascular:     Rate and Rhythm: Normal rate and regular rhythm.  Pulmonary:     Effort: Pulmonary effort is normal. No respiratory distress.     Breath sounds: Normal breath sounds. Decreased air movement present. No wheezing, rhonchi or rales.  Musculoskeletal:     Cervical back: Normal range of motion and neck supple.  Lymphadenopathy:     Cervical: No cervical adenopathy.  Skin:    General: Skin is warm and dry.     Coloration: Skin is not jaundiced or pale.     Findings: No erythema or rash.  Neurological:     Mental Status: He is alert and oriented to person, place, and time.  Psychiatric:        Behavior: Behavior is cooperative.      UC Treatments / Results  Labs (all labs ordered are listed, but only abnormal results are displayed) Labs Reviewed - No data to display  EKG   Radiology No results found.  Procedures Procedures (including critical care time)  Medications Ordered in UC Medications  ipratropium-albuterol (DUONEB) 0.5-2.5 (3) MG/3ML nebulizer solution 3 mL (3 mLs Nebulization Given 07/09/22 0943)    Initial Impression / Assessment and Plan / UC Course  I have reviewed the triage vital signs and the nursing notes.  Pertinent labs & imaging results that were available during my care of the patient were reviewed by me and considered in my medical decision making (see chart for details).   Patient is well-appearing, normotensive, afebrile, not tachycardic, not tachypneic, oxygenating well on room air.    1. COPD exacerbation (HCC) Vital signs and exam today are reassuring DuoNeb given in urgent care today with improvement in lung irrigation bilaterally and subjective improvement in shortness of  breath/coughing Continue COPD regimen including daily inhaler and rescue inhaler Start oral prednisone, azithromycin to help with lung inflammation, bacterial load Strict ER precautions discussed with patient  2. Allergic rhinitis, unspecified seasonality, unspecified trigger Continue Zyrtec, Flonase Flonase sent to pharmacy Patient with bilateral ear effusion and discussed that Flonase would likely help with this  The patient was given the opportunity to ask questions.  All questions answered to their satisfaction.  The patient is in agreement to this plan.    Final Clinical Impressions(s) / UC Diagnoses   Final diagnoses:  COPD exacerbation (HCC)  Allergic rhinitis, unspecified seasonality, unspecified trigger     Discharge Instructions      We gave you a DuoNeb today which helped increase your air movement to your lungs.  Please start the oral prednisone at home to help with lung inflammation as well as the azithromycin.  Continue home inhaler as well as albuterol inhaler every 4-6 hours as needed for wheezing/shortness of breath.  Resume Flonase to help with the fluid behind your ears.  Seek care for persistent/worsening symptoms despite treatment.     ED Prescriptions     Medication Sig Dispense Auth. Provider   predniSONE (DELTASONE) 20 MG tablet Take 2 tablets (40 mg total) by mouth daily with breakfast for 5 days. 10 tablet Cathlean Marseilles A, NP   fluticasone (FLONASE) 50 MCG/ACT nasal spray Place 2 sprays into both nostrils daily. 16 g Cathlean Marseilles A, NP   azithromycin (ZITHROMAX) 250 MG tablet Take (2) tablets by mouth on day 1, then take (1) tablet by mouth on days 2-5. 6 tablet Valentino Nose, NP      PDMP  not reviewed this encounter.   Valentino Nose, NP 07/09/22 1115

## 2022-08-02 ENCOUNTER — Encounter: Payer: Self-pay | Admitting: Emergency Medicine

## 2022-08-02 ENCOUNTER — Ambulatory Visit
Admission: EM | Admit: 2022-08-02 | Discharge: 2022-08-02 | Disposition: A | Discharge status: Home / Self Care | Payer: Medicare Other | Attending: Nurse Practitioner | Admitting: Nurse Practitioner

## 2022-08-02 ENCOUNTER — Ambulatory Visit (INDEPENDENT_AMBULATORY_CARE_PROVIDER_SITE_OTHER): Payer: Medicare Other

## 2022-08-02 ENCOUNTER — Other Ambulatory Visit: Payer: Self-pay

## 2022-08-02 DIAGNOSIS — J441 Chronic obstructive pulmonary disease with (acute) exacerbation: Secondary | ICD-10-CM

## 2022-08-02 DIAGNOSIS — J449 Chronic obstructive pulmonary disease, unspecified: Secondary | ICD-10-CM | POA: Diagnosis not present

## 2022-08-02 MED ORDER — DOXYCYCLINE HYCLATE 100 MG PO TABS: 100.0000 mg | ORAL_TABLET | Freq: Two times a day (BID) | ORAL | 0 refills | Status: AC

## 2022-08-02 MED ORDER — ALBUTEROL SULFATE HFA 108 (90 BASE) MCG/ACT IN AERS: 2.0000 | INHALATION_SPRAY | Freq: Four times a day (QID) | RESPIRATORY_TRACT | 0 refills | Status: DC | PRN

## 2022-08-02 MED ORDER — PREDNISONE 50 MG PO TABS: ORAL_TABLET | ORAL | 0 refills | Status: DC

## 2022-08-02 MED ORDER — METHYLPREDNISOLONE SODIUM SUCC 125 MG IJ SOLR
125.0000 mg | Freq: Once | INTRAMUSCULAR | Status: AC
  Administered 2022-08-02: 125 mg via INTRAMUSCULAR

## 2022-08-02 MED ORDER — IPRATROPIUM-ALBUTEROL 0.5-2.5 (3) MG/3ML IN SOLN
3.0000 mL | Freq: Once | RESPIRATORY_TRACT | Status: AC
  Administered 2022-08-02: 3 mL via RESPIRATORY_TRACT

## 2022-08-02 NOTE — ED Provider Notes (Signed)
[facility]    CSN: [id number] Arrival date & time: 08/02/22  1511      History   Chief Complaint Chief Complaint  Patient presents with   Cough    HPI Daniel [name] is a 67 y.o. male.   The history is provided by the patient.   The patient presents for complaints of shortness of breath and difficulty breathing.  Symptoms been present for the past several days.  Patient complains of shortness of breath with exertion.  Patient states that he has been coughing up "green" sputum.  He states he was seen for the same or similar symptoms approximately 5 to 6 weeks ago.  Patient reports history of COPD.  Patient states he is scheduled to follow-up with pulmonology sometime this month.  Patient reports he has been using his albuterol inhaler several times per day, he states that he has also been using his maintenance inhaler Stiolto.  Patient denies fever, chills, headache, nasal congestion, runny nose, chest tightness, chest pain, abdominal pain, nausea, vomiting, or diarrhea.  Past Medical History:  Diagnosis Date   COPD (chronic obstructive pulmonary disease) (HCC)    Hypertension     Patient Active Problem List   Diagnosis Date Noted   Hematochezia 04/02/2017   Heme + stool 04/02/2017   Rotator cuff tear 04/05/2013   Shoulder dislocation 04/05/2013   Acute respiratory failure (HCC) 12/22/2012   Essential hypertension, benign 12/22/2012   COPD exacerbation (HCC) 12/21/2012   Hypertriglyceridemia without hypercholesterolemia 03/12/2011   Chest pain 03/06/2011   Anxiety 03/06/2011   Obesity (BMI 30-39.9) 03/06/2011   Pre-diabetes 03/06/2011    Past Surgical History:  Procedure Laterality Date   APPENDECTOMY     COLONOSCOPY N/A 04/24/2017   Procedure: COLONOSCOPY;  Surgeon: West Bali, MD;  Location: [facility];  Service: Endoscopy;  Laterality: N/A;  1:00pm   DG 3RD DIGIT LEFT HAND     ligament torn, surgical repair of middle finger as well.   HEMORRHOID  SURGERY     HERNIA REPAIR     RLQ, umbilical       Home Medications    Prior to Admission medications   Medication Sig Start Date End Date Taking? Authorizing Provider  acetaminophen (TYLENOL) 500 MG tablet Take 1,000 mg by mouth daily as needed for moderate pain or headache.    [provider]  albuterol (PROVENTIL HFA;VENTOLIN HFA) 108 (90 BASE) MCG/ACT inhaler Inhale 2 puffs into the lungs every 4 (four) hours as needed for wheezing or shortness of breath. 12/23/12   Standley Brooking, MD  albuterol (PROVENTIL) (2.5 MG/3ML) 0.083% nebulizer solution Take 2.5 mg by nebulization every 6 (six) hours as needed for wheezing or shortness of breath.    [provider]  albuterol (PROVENTIL) 4 MG tablet Take 4 mg by mouth 4 (four) times daily.    [provider]  amLODipine (NORVASC) 5 MG tablet Take 5 mg by mouth daily.    [provider]  azithromycin (ZITHROMAX) 250 MG tablet Take (2) tablets by mouth on day 1, then take (1) tablet by mouth on days 2-5. 07/09/22   Valentino Nose, NP  cetirizine (ZYRTEC) 10 MG tablet Take 1 tablet (10 mg total) by mouth daily. 06/27/21   Mariadelaluz Guggenheim-Warren, Sadie Haber, NP  enalapril (VASOTEC) 5 MG tablet Take 10 mg by mouth 2 (two) times daily.     [provider]  fluticasone (FLONASE) 50 MCG/ACT nasal spray Place 2 sprays into both nostrils  daily. 07/09/22   Valentino Nose, NP  furosemide (LASIX) 20 MG tablet Take 20 mg by mouth daily as needed for edema.  03/06/13   [provider]  Glucosamine-Chondroitin (OSTEO BI-FLEX REGULAR STRENGTH PO) Take 1 tablet by mouth daily.    [provider]  lidocaine (XYLOCAINE) 2 % solution Use as directed 5 mLs in the mouth or throat as needed for mouth pain. Gargle and spit 5 mL as needed for throat pain. 06/08/22   Olevia Westervelt-Warren, Sadie Haber, NP  Multiple Vitamin (MULITIVITAMIN WITH MINERALS) TABS Take 1 tablet by mouth daily.    [provider]   Tamsulosin HCl (FLOMAX) 0.4 MG CAPS Take 0.4 mg by mouth at bedtime.     [provider]    Family History Family History  Problem Relation Age of Onset   Seizures Mother    Hypertension Mother    COPD Father    Colon cancer Neg Hx    Gastric cancer Neg Hx    Esophageal cancer Neg Hx     Social History Social History   Tobacco Use   Smoking status: Former    Packs/day: 0.30    Years: 40.00    Additional pack years: 0.00    Total pack years: 12.00    Types: Cigarettes    Quit date: 01/28/2013    Years since quitting: 9.5   Smokeless tobacco: Never  Substance Use Topics   Alcohol use: No   Drug use: No     Allergies   Patient has no known allergies.   Review of Systems Review of Systems Per HPI  Physical Exam Triage Vital Signs ED Triage Vitals [08/02/22 1557]  Enc Vitals Group     BP 136/75     Pulse Rate 62     Resp 20     Temp 98.4 F (36.9 C)     Temp Source Oral     SpO2 93 %     Weight      Height      Head Circumference      Peak Flow      Pain Score 0     Pain Loc      Pain Edu?      Excl. in GC?    No data found.  Updated Vital Signs BP 136/75 (BP Location: Right Arm)   Pulse 62   Temp 98.4 F (36.9 C) (Oral)   Resp 20   SpO2 93%   Visual Acuity Right Eye Distance:   Left Eye Distance:   Bilateral Distance:    Right Eye Near:   Left Eye Near:    Bilateral Near:     Physical Exam Vitals and nursing note reviewed.  Constitutional:      General: He is not in acute distress.    Appearance: Normal appearance.  HENT:     Head: Normocephalic.     Right Ear: Tympanic membrane, ear canal and external ear normal.     Left Ear: Tympanic membrane, ear canal and external ear normal.  Eyes:     Extraocular Movements: Extraocular movements intact.     Conjunctiva/sclera: Conjunctivae normal.     Pupils: Pupils are equal, round, and reactive to light.  Cardiovascular:     Rate and Rhythm: Normal rate and regular rhythm.      Pulses: Normal pulses.     Heart sounds: Normal heart sounds.  Pulmonary:     Effort: Pulmonary effort is normal.  Breath sounds: Examination of the right-lower field reveals decreased breath sounds. Examination of the left-lower field reveals decreased breath sounds. Decreased breath sounds present.  Abdominal:     General: Bowel sounds are normal.     Palpations: Abdomen is soft.     Tenderness: There is no abdominal tenderness.  Musculoskeletal:     Cervical back: Normal range of motion.  Lymphadenopathy:     Cervical: No cervical adenopathy.  Skin:    General: Skin is warm and dry.  Neurological:     General: No focal deficit present.     Mental Status: He is alert and oriented to person, place, and time.  Psychiatric:        Mood and Affect: Mood normal.        Behavior: Behavior normal.      UC Treatments / Results  Labs (all labs ordered are listed, but only abnormal results are displayed) Labs Reviewed - No data to display  EKG   Radiology DG Chest 2 View  Result Date: 08/02/2022 CLINICAL DATA:  Shortness of breath with cough for 4 days. History of COPD and pneumonia. EXAM: CHEST - 2 VIEW COMPARISON:  Radiographs 01/27/2018 and 12/20/2012.  CT 02/16/2003. FINDINGS: The heart size and mediastinal contours are stable. The heart size is at the upper limits of normal. There is mild aortic tortuosity. The lungs are hyperinflated with mildly increased central airway thickening. No evidence of airspace disease, edema, pleural effusion or pneumothorax. Stable mild degenerative changes in the spine. IMPRESSION: Chronic obstructive pulmonary disease with mildly increased central airway thickening which could be due to superimposed bronchitis. No evidence of pneumonia or edema. Electronically Signed   By: Carey Bullocks M.D.   On: 08/02/2022 16:34    Procedures Procedures (including critical care time)  Medications Ordered in UC Medications  ipratropium-albuterol (DUONEB)  0.5-2.5 (3) MG/3ML nebulizer solution 3 mL (3 mLs Nebulization Given 08/02/22 1635)  methylPREDNISolone sodium succinate (SOLU-MEDROL) 125 mg/2 mL injection 125 mg (125 mg Intramuscular Given 08/02/22 1635)    Initial Impression / Assessment and Plan / UC Course  I have reviewed the triage vital signs and the nursing notes.  Pertinent labs & imaging results that were available during my care of the patient were reviewed by me and considered in my medical decision making (see chart for details).  The patient is well-appearing, he is in no acute distress, he is speaking in complete sentences, room air sats at 93%.  Chest x-ray consistent with COPD exacerbation.  Patient was given an injection of Solu-Medrol 125 mg IM and a DuoNeb in the clinic.  Per patient COPD exacerbation, prednisone 50 mg was prescribed, along with doxycycline 100 mg for possible bacterial infection.  Patient's albuterol inhaler was also refilled for shortness of breath and wheezing.  Supportive care recommendations were provided and discussed with the patient to include avoidance of triggers for his COPD, using a humidifier in the bedroom might nighttime during sleep, and sleeping elevated.  Patient is scheduled to see pulmonology, follow-up as scheduled.  Patient was given strict ER follow-up precautions.  Patient is in agreement with this plan of care and verbalizes understanding.  All questions were answered.  Patient stable for discharge.   Final Clinical Impressions(s) / UC Diagnoses   Final diagnoses:  None   Discharge Instructions   None    ED Prescriptions   None    PDMP not reviewed this encounter.   Abran Cantor, NP 08/02/22 1643

## 2022-08-02 NOTE — Discharge Instructions (Addendum)
The chest x-ray is consistent with a COPD exacerbation. Take medication as prescribed. May take over-the-counter Tylenol as needed for pain, fever, or general discomfort. Recommend using a humidifier in your bedroom at nighttime during sleep.  It may also be helpful for you to sleep elevated while symptoms persist. Please avoid hot temperatures or other environments that may trigger your COPD. Follow-up with the pulmonologist as scheduled. Follow-up in the emergency department immediately if you experience worsening shortness of breath, difficulty breathing, or become unable to speak in a complete sentence. Follow-up as needed.

## 2022-08-02 NOTE — ED Triage Notes (Signed)
Pt reports was seen for same 5-6 weeks ago and reports productive cough improved but returned and states "now its affecting my breathing at times." NAD noted. Pt able to speak clear completed sentences. Mild dyspnea noted with exertion.

## 2022-08-21 NOTE — Progress Notes (Unsigned)
Daniel [name], male    DOB: 10/18/1955    MRN: [id number]   Brief patient profile:  46 yowm  quit smoking 2010 with some cough/sob @ wt around 227  that transiently  improved but worse since ? 2015 referred to pulmonary clinic in [facility]  08/22/2022 by [facility]  UC  s/p ? Aecopd  08/02/22   UC  08/02/22 rx for aecopd some better   History of Present Illness  08/22/2022  Pulmonary/ 1st office eval/ Daniel [name] / [facility] on  stiolto/albuterol po/ iv/neb  Chief Complaint  Patient presents with   Shortness of Breath   Establish Care  Dyspnea:  ok on flat on surface in a/c Cough: bad to worse for 6 m dry / hacking  all dattime  Sleep: flat bed  / 2 pillows / on side - can't breathe on back (immediated "smothering s choking)  otherwise  SABA use: 3-4 x days/ no neb 02: none  Overt hb   No obvious other  day to day or daytime patterns/variability or assoc excess/ purulent sputum or mucus plugs or hemoptysis or cp or chest tightness, subjective wheeze or overt sinus  symptoms.    Also denies any obvious fluctuation of symptoms with weather or environmental changes or other aggravating or alleviating factors except as outlined above   No unusual exposure hx or h/o childhood pna/ asthma or knowledge of premature birth.  Current Allergies, Complete Past Medical History, Past Surgical History, Family History, and Social History were reviewed in [employer] record.  ROS  The following are not active complaints unless bolded Hoarseness, sore throat, dysphagia, dental problems, itching, sneezing,  nasal congestion or discharge of excess mucus or purulent secretions, ear ache,   fever, chills, sweats, unintended wt loss or wt gain, classically pleuritic or exertional cp,  orthopnea pnd or arm/hand swelling  or leg swelling, presyncope, palpitations, abdominal pain, anorexia, nausea, vomiting, diarrhea  or change in bowel habits or change in bladder habits, change in stools or change  in urine, dysuria, hematuria,  rash, arthralgias, visual complaints, headache, numbness, weakness or ataxia or problems with walking or coordination,  change in mood or  memory.              Outpatient Medications Prior to Visit  Medication Sig Dispense Refill   acetaminophen (TYLENOL) 500 MG tablet Take 1,000 mg by mouth daily as needed for moderate pain or headache.     albuterol (PROVENTIL) 4 MG tablet Take 4 mg by mouth 4 (four) times daily.     albuterol (VENTOLIN HFA) 108 (90 Base) MCG/ACT inhaler Inhale 2 puffs into the lungs every 6 (six) hours as needed for wheezing or shortness of breath. 8 g 0   ALPRAZolam (XANAX) 0.5 MG tablet Take 0.5 mg by mouth at bedtime.     amLODipine (NORVASC) 2.5 MG tablet Take 2.5 mg by mouth daily.     cetirizine (ZYRTEC) 10 MG tablet Take 1 tablet (10 mg total) by mouth daily. 30 tablet 0   enalapril (VASOTEC) 5 MG tablet Take 10 mg by mouth 2 (two) times daily.      fluticasone (FLONASE) 50 MCG/ACT nasal spray Place 2 sprays into both nostrils daily. 16 g 0   Glucosamine-Chondroitin (OSTEO BI-FLEX REGULAR STRENGTH PO) Take 1 tablet by mouth daily.     lidocaine (XYLOCAINE) 2 % solution Use as directed 5 mLs in the mouth or throat as needed for mouth pain. Gargle and spit 5 mL as  needed for throat pain. 100 mL 0   Multiple Vitamin (MULITIVITAMIN WITH MINERALS) TABS Take 1 tablet by mouth daily.     oxyCODONE (OXY IR/ROXICODONE) 5 MG immediate release tablet Take 5 mg by mouth every 4 (four) hours.     STIOLTO RESPIMAT 2.5-2.5 MCG/ACT AERS SMARTSIG:2 Puff(s) By Mouth Daily     Tamsulosin HCl (FLOMAX) 0.4 MG CAPS Take 0.4 mg by mouth at bedtime.      furosemide (LASIX) 20 MG tablet Take 20 mg by mouth daily as needed for edema.  (Patient not taking: Reported on 08/22/2022)     amLODipine (NORVASC) 5 MG tablet Take 5 mg by mouth daily.     azithromycin (ZITHROMAX) 250 MG tablet Take (2) tablets by mouth on day 1, then take (1) tablet by mouth on days 2-5. 6  tablet 0   predniSONE (DELTASONE) 50 MG tablet Take 1 tablet daily with breakfast for the next 5 days. 5 tablet 0   No facility-administered medications prior to visit.    Past Medical History:  Diagnosis Date   COPD (chronic obstructive pulmonary disease) (HCC)    Hypertension       Objective:     BP 118/71   Pulse 71   Ht 6\' 2"  (1.88 m)   Wt 279 lb (126.6 kg)   SpO2 93%   BMI 35.82 kg/m   SpO2: 93 % RA    Amb mod obese hoarse wm nad  HEENT : Oropharynx  clear     NECK :  without  apparent JVD/ palpable Nodes/TM    LUNGS: no acc muscle use,  Min barrel  contour chest wall with bilateral  slightly decreased bs s audible wheeze and  without cough on insp or exp maneuvers and min  Hyperresonant  to  percussion bilaterally    CV:  RRR  no s3 or murmur or increase in P2, and no edema   ABD:  obese but soft and nontender with pos end  insp Hoover's  in the supine position.  No bruits or organomegaly appreciated   MS:  Nl gait/ ext warm without deformities Or obvious joint restrictions  calf tenderness, cyanosis or clubbing     SKIN: warm and dry without lesions    NEURO:  alert, approp, nl sensorium with  no motor or cerebellar deficits apparent.         I personally reviewed images and agree with radiology impression as follows:  CXR:   08/02/22  Chronic obstructive pulmonary disease with mildly increased central airway thickening which could be due to superimposed bronchitis. No evidence of pneumonia or edema.    Assessment   Asthmatic bronchitis  with compoennt of UACS Quit smoking 2010 - try off acei 08/22/2022 and on prn saba - 08/22/2022  After extensive coaching inhaler device,  effectiveness =    75% hfa and has prn neb as well    When respiratory symptoms become refractory well after a patient reports complete smoking cessation,  Especially when this wasn't the case while they were smoking, a red flag is raised based on the work of Dr Primitivo Gauze which  states:  if you quit smoking when your best day FEV1 is still well preserved it is highly unlikely you will progress to severe disease.  That is to say, once the smoking stops,  the symptoms should not suddenly erupt or markedly worsen.  If so, the differential diagnosis should include  obesity/deconditioning,  LPR/Reflux/Aspiration syndromes,  occult CHF, or side  effect of medications commonly used in this population, especially vasotec   Try off vasotec then see if symptoms flare over stiolto, which he is convinced is making cough worse and not helping his breathing.   He has backup saba in 3 different forms which is ok in short run then return in 6 weeks to regroup  with all meds in hand using a trust but verify approach to confirm accurate Medication  Reconciliation The principal here is that until we are certain that the  patients are doing what we've asked, it makes no sense to ask them to do more.   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   In the best review of chronic cough to date ( NEJM 2016 375 9147-8295) ,  ACEi are now felt to cause cough in up to  20% of pts which is a 4 fold increase from previous reports and does not include the variety of non-specific complaints we see in pulmonary clinic in pts on ACEi but previously attributed to another dx like  Copd/asthma and  include PNDS, throat and chest congestion, "bronchitis", unexplained dyspnea and noct "strangling" sensations, and hoarseness, but also  atypical /refractory GERD symptoms like dysphagia and "bad heartburn"   The only way I know  to prove this is not an "ACEi Case" is a trial off ACEi x a minimum of 6 weeks then regroup.   Try valsartan 160 mg titrated up or down for SBP around 120   F/u in 6 weeks         Each maintenance medication was reviewed in detail including emphasizing most importantly the difference between maintenance and prns and under what circumstances the  prns are to be triggered using an action plan format where appropriate.  Total time for H and P, chart review, counseling, reviewing hfa/smi/neb device(s) and generating customized AVS unique to this office visit / same day charting  > 45 min for new pt with multiple  refractory respiratory  symptoms of uncertain etiology           Sandrea Hughs, MD 08/22/2022

## 2022-08-22 ENCOUNTER — Ambulatory Visit: Payer: Medicare Other | Admitting: Internal Medicine

## 2022-08-22 ENCOUNTER — Encounter: Payer: Self-pay | Admitting: Internal Medicine

## 2022-08-22 VITALS — BP 118/71 | HR 71 | Ht 74.0 in | Wt 279.0 lb

## 2022-08-22 DIAGNOSIS — I10 Essential (primary) hypertension: Secondary | ICD-10-CM | POA: Diagnosis not present

## 2022-08-22 DIAGNOSIS — J4489 Other specified chronic obstructive pulmonary disease: Secondary | ICD-10-CM | POA: Diagnosis not present

## 2022-08-22 DIAGNOSIS — J449 Chronic obstructive pulmonary disease, unspecified: Secondary | ICD-10-CM | POA: Insufficient documentation

## 2022-08-22 MED ORDER — VALSARTAN 160 MG PO TABS: 160.0000 mg | ORAL_TABLET | Freq: Every day | ORAL | 11 refills | Status: DC

## 2022-08-22 NOTE — Assessment & Plan Note (Signed)
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   In the best review of chronic cough to date ( NEJM 2016 375 [id number]) ,  ACEi are now felt to cause cough in up to  20% of pts which is a 4 fold increase from previous reports and does not include the variety of non-specific complaints we see in pulmonary clinic in pts on ACEi but previously attributed to another dx like  Copd/asthma and  include PNDS, throat and chest congestion, "bronchitis", unexplained dyspnea and noct "strangling" sensations, and hoarseness, but also  atypical /refractory GERD symptoms like dysphagia and "bad heartburn"   The only way I know  to prove this is not an "ACEi Case" is a trial off ACEi x a minimum of 6 weeks then regroup.   Try valsartan 160 mg titrated up or down for SBP around 120   F/u in 6 weeks         Each maintenance medication was reviewed in detail including emphasizing most importantly the difference between maintenance and prns and under what circumstances the prns are to be triggered using an action plan format where appropriate.  Total time for H and P, chart review, counseling, reviewing hfa/smi/neb device(s) and generating customized AVS unique to this office visit / same day charting  > 45 min for new pt with multiple  refractory respiratory  symptoms of uncertain etiology

## 2022-08-22 NOTE — Assessment & Plan Note (Signed)
Quit smoking 2010 - try off acei 08/22/2022 and on prn saba - 08/22/2022  After extensive coaching inhaler device,  effectiveness =    75% hfa and has prn neb as well    When respiratory symptoms become refractory well after a patient reports complete smoking cessation,  Especially when this wasn't the case while they were smoking, a red flag is raised based on the work of Dr Primitivo Gauze which states:  if you quit smoking when your best day FEV1 is still well preserved it is highly unlikely you will progress to severe disease.  That is to say, once the smoking stops,  the symptoms should not suddenly erupt or markedly worsen.  If so, the differential diagnosis should include  obesity/deconditioning,  LPR/Reflux/Aspiration syndromes,  occult CHF, or side effect of medications commonly used in this population, especially vasotec   Try off vasotec then see if symptoms flare over stiolto, which he is convinced is making cough worse and not helping his breathing.   He has backup saba in 3 different forms which is ok in short run then return in 6 weeks to regroup  with all meds in hand using a trust but verify approach to confirm accurate Medication  Reconciliation The principal here is that until we are certain that the  patients are doing what we've asked, it makes no sense to ask them to do more.

## 2022-08-22 NOTE — Patient Instructions (Addendum)
D/C  vasotec and take one half of the new pill = valsartan in place of vasotec tonight   Valsartan 160 mg one daily and ok add or subtract a half if Blood pressure not ideal   Try off stiolto respimat and albuterol pills after  08/25/22 doses   Only use your albuterol as a rescue medication to be used if you can't catch your breath by resting or doing a relaxed purse lip breathing pattern.  - The less you use it, the better it will work when you need it. - Ok to use up to 2 puffs  every 4 hours if you must but call for immediate appointment if use goes up over your usual need - Don't leave home without it !!  (think of it like the starter fluid  for your car engine)   Also  Ok to try albuterol  x 2 puffs x 15 min before an activity (on alternating days)  that you know would usually make you short of breath and see if it makes any difference and if makes none then don't take albuterol after activity unless you can't catch your breath as this means it's the resting that helps, not the albuterol.  For cough > mucinex DM 1200 mg every 12 hours as needed   Schedule PFTs next available   Please schedule a follow up office visit in 6 weeks, call sooner if needed

## 2022-08-22 NOTE — Addendum Note (Signed)
Addended by: Shelby Dubin on: 08/22/2022 02:17 PM   Modules accepted: Orders

## 2022-08-28 ENCOUNTER — Ambulatory Visit (HOSPITAL_COMMUNITY)
Admission: RE | Admit: 2022-08-28 | Discharge: 2022-08-28 | Disposition: A | Discharge status: Home / Self Care | Payer: Medicare Other | Source: Ambulatory Visit | Attending: Internal Medicine | Admitting: Internal Medicine

## 2022-08-28 DIAGNOSIS — R0609 Other forms of dyspnea: Secondary | ICD-10-CM | POA: Diagnosis not present

## 2022-08-28 DIAGNOSIS — J4489 Other specified chronic obstructive pulmonary disease: Secondary | ICD-10-CM | POA: Diagnosis not present

## 2022-08-28 DIAGNOSIS — Z87891 Personal history of nicotine dependence: Secondary | ICD-10-CM | POA: Insufficient documentation

## 2022-08-28 LAB — PULMONARY FUNCTION TEST
DL/VA % pred: 85 %
DL/VA: 3.44 ml/min/mmHg/L
DLCO unc % pred: 59 %
DLCO unc: 17.61 ml/min/mmHg
FEF 25-75 Post: 0.8 L/s
FEF 25-75 Pre: 0.72 L/s
FEF2575-%Change-Post: 11 %
FEF2575-%Pred-Post: 26 %
FEF2575-%Pred-Pre: 23 %
FEV1-%Change-Post: 0 %
FEV1-%Pred-Post: 43 %
FEV1-%Pred-Pre: 42 %
FEV1-Post: 1.67 L
FEV1-Pre: 1.66 L
FEV1FVC-%Change-Post: 0 %
FEV1FVC-%Pred-Pre: 74 %
FEV6-%Change-Post: 0 %
FEV6-%Pred-Post: 58 %
FEV6-%Pred-Pre: 58 %
FEV6-Post: 2.93 L
FEV6-Pre: 2.91 L
FEV6FVC-%Change-Post: 0 %
FEV6FVC-%Pred-Post: 101 %
FEV6FVC-%Pred-Pre: 101 %
FVC-%Change-Post: 0 %
FVC-%Pred-Post: 58 %
FVC-%Pred-Pre: 57 %
FVC-Post: 3.04 L
FVC-Pre: 3.02 L
Post FEV1/FVC ratio: 55 %
Post FEV6/FVC ratio: 96 %
Pre FEV1/FVC ratio: 55 %
Pre FEV6/FVC Ratio: 96 %
RV % pred: 389 %
RV: 10.15 L
TLC % pred: 169 %
TLC: 13.29 L

## 2022-08-28 MED ORDER — ALBUTEROL SULFATE (2.5 MG/3ML) 0.083% IN NEBU
2.5000 mg | INHALATION_SOLUTION | Freq: Once | RESPIRATORY_TRACT | Status: AC
  Administered 2022-08-28: 2.5 mg via RESPIRATORY_TRACT

## 2022-08-30 ENCOUNTER — Telehealth: Payer: Self-pay | Admitting: Internal Medicine

## 2022-08-30 NOTE — Telephone Encounter (Signed)
Spoke with patient and he states since stopping Stiolto and alb tabs he has increased shob. He is able to have a conversation without stopping for breath. He does not feel it is severe, but has noticed a slight increase. Advised patient to restart Stiolto and contact us on Monday if he is not improving. Reviewed ED/UC protocols with patient. Nothing further needed at this time.    Dr. Sherene Sires - [place] Childes

## 2022-08-30 NOTE — Telephone Encounter (Signed)
Pt. Calling needs med advice about new bp meds Dr. Sherene Sires put him on having SOB

## 2022-09-01 ENCOUNTER — Encounter: Payer: Self-pay | Admitting: Internal Medicine

## 2022-09-16 NOTE — Progress Notes (Unsigned)
Daniel [name], male    DOB: 03/05/1955    MRN: [id number]   Brief patient profile:  51 yowm  quit smoking 2010 with some cough/sob @ wt around 227  that transiently  improved but worse since ? 2015 referred to pulmonary clinic in [facility]  08/22/2022 by [facility]  [facility]  s/p ? Aecopd  08/02/22   [facility]  08/02/22 rx for aecopd some better   History of Present Illness  08/22/2022  Pulmonary/ 1st office eval/ Daniel [name] / [place] [facility] on  stiolto/albuterol po/ iv/neb  Chief Complaint  Patient presents with   Shortness of Breath   Establish Care  Dyspnea:  ok on flat on surface in a/c Cough: bad to worse for 6 m dry / hacking  all daytime  Sleep: flat bed  / 2 pillows / on side - can't breathe on back (immediated "smothering s choking)  otherwise  SABA use: 3-4 x days/ no neb 02: none  Overt hb  Rec D/C  vasotec and take one half of the new pill = valsartan in place of vasotec tonight  Valsartan 160 mg one daily and ok add or subtract a half if Blood pressure not ideal  Try off stiolto respimat and albuterol pills after  08/25/22 doses  Only use your albuterol as a rescue medication Also  Ok to try albuterol  x 2 puffs x 15 min before an activity (on alternating days)  that you know would usually make you short of breath    For cough > mucinex DM 1200 mg every 12 hours as needed   Schedule PFTs next available    09/17/2022  f/u ov/[place] office/Daniel [name] re: ? Acei case / GOLD 3 copd  maint on stiolto  needs eos and alpha one screening  Chief Complaint  Patient presents with   Asthmatic bronchitis with component of UACS   Dyspnea:  MMRC1 = can walk nl pace, flat grade, can't hurry or go uphills or steps s sob  / mailbox 50 and flat  Cough: 24 h acutely worse with green mucus s nasal symptoms  Sleeping: on side bed level one pillow  no  resp cc  SABA use: none 02: none needed       No obvious day to day or daytime variability or assoc excess/ purulent sputum or mucus plugs or hemoptysis or cp or  chest tightness, subjective wheeze or overt sinus or hb symptoms.    Also denies any obvious fluctuation of symptoms with weather or environmental changes or other aggravating or alleviating factors except as outlined above   No unusual exposure hx or h/o childhood pna/ asthma or knowledge of premature birth.  Current Allergies, Complete Past Medical History, Past Surgical History, Family History, and Social History were reviewed in [employer] record.  ROS  The following are not active complaints unless bolded Hoarseness, sore throat, dysphagia, dental problems, itching, sneezing,  nasal congestion or discharge of excess mucus or purulent secretions, ear ache,   fever, chills, sweats, unintended wt loss or wt gain, classically pleuritic or exertional cp,  orthopnea pnd or arm/hand swelling  or leg swelling, presyncope, palpitations, abdominal pain, anorexia, nausea, vomiting, diarrhea  or change in bowel habits or change in bladder habits, change in stools or change in urine, dysuria, hematuria,  rash, arthralgias, visual complaints, headache, numbness, weakness or ataxia or problems with walking or coordination,  change in mood or  memory.        Current Meds  Medication  Sig   acetaminophen (TYLENOL) 500 MG tablet Take 1,000 mg by mouth daily as needed for moderate pain or headache.   albuterol (PROVENTIL) (2.5 MG/3ML) 0.083% nebulizer solution Take 2.5 mg by nebulization every 6 (six) hours as needed for wheezing or shortness of breath.   albuterol (VENTOLIN HFA) 108 (90 Base) MCG/ACT inhaler Inhale 2 puffs into the lungs every 6 (six) hours as needed for wheezing or shortness of breath.   ALPRAZolam (XANAX) 0.5 MG tablet Take 0.5 mg by mouth at bedtime.   amLODipine (NORVASC) 2.5 MG tablet Take 2.5 mg by mouth daily.   cetirizine (ZYRTEC) 10 MG tablet Take 1 tablet (10 mg total) by mouth daily.   fluticasone (FLONASE) 50 MCG/ACT nasal spray Place 2 sprays into both  nostrils daily.   Glucosamine-Chondroitin (OSTEO BI-FLEX REGULAR STRENGTH PO) Take 1 tablet by mouth daily.   lidocaine (XYLOCAINE) 2 % solution Use as directed 5 mLs in the mouth or throat as needed for mouth pain. Gargle and spit 5 mL as needed for throat pain.   Multiple Vitamin (MULITIVITAMIN WITH MINERALS) TABS Take 1 tablet by mouth daily.   oxyCODONE (OXY IR/ROXICODONE) 5 MG immediate release tablet Take 5 mg by mouth every 4 (four) hours.   STIOLTO RESPIMAT 2.5-2.5 MCG/ACT AERS SMARTSIG:2 Puff(s) By Mouth Daily   Tamsulosin HCl (FLOMAX) 0.4 MG CAPS Take 0.4 mg by mouth at bedtime.    valsartan (DIOVAN) 160 MG tablet Take 1 tablet (160 mg total) by mouth daily.           Past Medical History:  Diagnosis Date   COPD (chronic obstructive pulmonary disease) (HCC)    Hypertension       Objective:    Wts   09/17/2022        279   08/22/22 279 lb (126.6 kg)  06/18/20 270 lb (122.5 kg)  01/27/18 287 lb (130.2 kg)      Vital signs reviewed  09/17/2022  - Note at rest 02 sats  91% on RA   General appearance:    amb mod obese hoarse wm nad  HEENT : Oropharynx clear     NECK :  without  apparent JVD/ palpable Nodes/TM    LUNGS: no acc muscle use,  Mild barrel  contour chest wall with bilateral  Distant bs s audible wheeze and  without cough on insp or exp maneuvers  and mild  Hyperresonant  to  percussion bilaterally     CV:  RRR  no s3 or murmur or increase in P2, and no edema   ABD: obese  soft and nontender with pos end  insp Hoover's  in the supine position.  No bruits or organomegaly appreciated   MS:  Nl gait/ ext warm without deformities Or obvious joint restrictions  calf tenderness, cyanosis or clubbing     SKIN: warm and dry without lesions    NEURO:  alert, approp, nl sensorium with  no motor or cerebellar deficits apparent.      Assessment

## 2022-09-17 ENCOUNTER — Encounter: Payer: Self-pay | Admitting: Internal Medicine

## 2022-09-17 ENCOUNTER — Ambulatory Visit: Payer: Medicare Other | Admitting: Internal Medicine

## 2022-09-17 VITALS — BP 126/78 | HR 95 | Ht 74.0 in | Wt 279.0 lb

## 2022-09-17 DIAGNOSIS — I10 Essential (primary) hypertension: Secondary | ICD-10-CM | POA: Diagnosis not present

## 2022-09-17 DIAGNOSIS — J449 Chronic obstructive pulmonary disease, unspecified: Secondary | ICD-10-CM | POA: Diagnosis not present

## 2022-09-17 MED ORDER — AZITHROMYCIN 250 MG PO TABS: ORAL_TABLET | ORAL | 0 refills | Status: DC

## 2022-09-17 MED ORDER — VALSARTAN 160 MG PO TABS: 160.0000 mg | ORAL_TABLET | Freq: Every day | ORAL | 11 refills | Status: DC

## 2022-09-17 NOTE — Patient Instructions (Addendum)
Zpak   For cough/ congestion > mucinex dm  up to maximum of  1200 mg every 12 hours as needed   If not all better Try prilosec otc 20mg   Take 30-60 min before first meal of the day and Pepcid ac (famotidine) 20 mg one after supper bedtime until cough /throat congestion are completely gone for at least a week without the need for cough suppression   Continue stiolto 2 puffs each am for now    Please schedule a follow up office visit in 6 weeks, call sooner if needed

## 2022-09-17 NOTE — Assessment & Plan Note (Signed)
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   >>> continue valsartan 160 mg daily / f/u pcp for refills if agreeable.          Each maintenance medication was reviewed in detail including emphasizing most importantly the difference between maintenance and prns and under what circumstances the prns are to be triggered using an action plan format where appropriate.  Total time for H and P, chart review, counseling, reviewing hfa/smi device(s) and generating customized AVS unique to this office visit / same day charting > 30 min

## 2022-09-17 NOTE — Assessment & Plan Note (Addendum)
Quit smoking 2010 - try off acei 08/22/2022 and on prn saba - 08/22/2022  After extensive coaching inhaler device,  effectiveness =    75% hfa and has prn neb as well  - PFT's  08/28/22  FEV1 1.67 (43 % ) ratio 0.55  p 0 % improvement from saba p saba prior to study with DLCO  17.61 (59%)   and FV curve classically concave  and ERV 17% at wt 250   - 09/17/2022  After extensive coaching inhaler device,  effectiveness =  75% with hfa (early trigger, short Ti)      Mild flare purulent bronchitis> rx zpak  / add gerd rx next and if persists may need symbicort 80/spiriva to avoid irritation of upper airway   Re saba: Re SABA :  I spent extra time with pt today reviewing appropriate use of albuterol for prn use on exertion with the following points: 1) saba is for relief of sob that does not improve by walking a slower pace or resting but rather if the pt does not improve after trying this first. 2) If the pt is convinced, as many are, that saba helps recover from activity faster then it's easy to tell if this is the case by re-challenging : ie stop, take the inhaler, then p 5 minutes try the exact same activity (intensity of workload) that just caused the symptoms and see if they are substantially diminished or not after saba 3) if there is an activity that reproducibly causes the symptoms, try the saba 15 min before the activity on alternate days   If in fact the saba really does help, then fine to continue to use it prn but advised may need to look closer at the maintenance regimen (for now stiolto 2 each am)  being used to achieve better control of airways disease with exertion.

## 2022-09-22 ENCOUNTER — Emergency Department: Admit: 2022-09-22 | Payer: Medicare (Managed Care)

## 2022-09-22 ENCOUNTER — Inpatient Hospital Stay
Admit: 2022-09-22 | Discharge: 2022-09-23 | Disposition: A | Discharge status: Home / Self Care | Payer: Medicare (Managed Care)

## 2022-09-22 DIAGNOSIS — R519 Headache, unspecified: Secondary | ICD-10-CM

## 2022-09-22 DIAGNOSIS — N390 Urinary tract infection, site not specified: Secondary | ICD-10-CM

## 2022-09-22 LAB — DIFFERENTIAL
Basophils Absolute: 0 /uL (ref 0–200)
Basophils Relative: 0 % (ref 0.0–1.0)
Eosinophils Absolute: 66 /uL (ref 15–500)
Eosinophils Relative: 1 % (ref 0.0–8.0)
Lymphocytes Absolute: 2838 /uL (ref 850–3900)
Lymphocytes Relative: 43 % (ref 15.0–45.0)
Monocytes Absolute: 462 /uL (ref 200–950)
Monocytes Relative: 7 % (ref 0.0–12.0)
Neutrophils Absolute: 3234 /uL (ref 1500–7800)
Neutrophils Relative: 49 % (ref 40.0–80.0)
PLT Morphology: NORMAL

## 2022-09-22 LAB — URINALYSIS, MICROSCOPIC
RBC, UA: 2 /HPF (ref 0–3)
Squam Epithel, UA: 4 /HPF (ref 0–5)
WBC, UA: 21 /HPF — ABNORMAL HIGH (ref 0–5)

## 2022-09-22 LAB — BASIC METABOLIC PANEL
Anion Gap: 11 mmol/L (ref 3–16)
BUN: 13 mg/dL (ref 7–25)
CO2: 20 mmol/L — ABNORMAL LOW (ref 21–33)
Calcium: 8.8 mg/dL (ref 8.6–10.3)
Chloride: 106 mmol/L (ref 98–110)
Creatinine: 1.27 mg/dL (ref 0.60–1.30)
EGFR: 62
Glucose: 89 mg/dL (ref 70–100)
Osmolality, Calculated: 284 mOsm/kg (ref 278–305)
Potassium: 3.5 mmol/L (ref 3.5–5.3)
Sodium: 137 mmol/L (ref 133–146)

## 2022-09-22 LAB — CBC
Hematocrit: 43.2 % (ref 38.5–50.0)
Hemoglobin: 14.9 g/dL (ref 13.2–17.1)
MCH: 30 pg (ref 27.0–33.0)
MCHC: 34.5 g/dL (ref 32.0–36.0)
MCV: 87 fL (ref 80.0–100.0)
MPV: 8.3 fL (ref 7.5–11.5)
Platelets: 183 10*3/uL (ref 140–400)
RBC: 4.96 10*6/uL (ref 4.20–5.80)
RDW: 17.4 % — ABNORMAL HIGH (ref 11.0–15.0)
WBC: 6.6 10*3/uL (ref 3.8–10.8)

## 2022-09-22 LAB — URINALYSIS-MACROSCOPIC W/REFLEX TO MICROSCOPIC
Bilirubin, UA: NEGATIVE
Glucose, UA: NEGATIVE mg/dL
Ketones, UA: NEGATIVE mg/dL
Nitrite, UA: NEGATIVE
Protein, UA: 30 mg/dL — AB
Specific Gravity, UA: 1.025 (ref 1.005–1.035)
Urobilinogen, UA: 1 EU/dL (ref 0.2–1.0)
pH, UA: 6 (ref 5.0–8.0)

## 2022-09-22 LAB — HIGH SENSITIVITY TROPONIN
High Sensitivity Troponin: 10 ng/L (ref 0–20)
High Sensitivity Troponin: 9 ng/L (ref 0–20)

## 2022-09-22 LAB — HEPATIC FUNCTION PANEL
ALT: 4 U/L — ABNORMAL LOW (ref 7–52)
AST: 11 U/L — ABNORMAL LOW (ref 13–39)
Albumin: 4 g/dL (ref 3.5–5.7)
Alkaline Phosphatase: 72 U/L (ref 36–125)
Bilirubin, Direct: 0.3 mg/dL (ref 0.0–0.4)
Bilirubin, Indirect: 1.1 mg/dL (ref 0.0–1.1)
Total Bilirubin: 1.4 mg/dL (ref 0.0–1.5)
Total Protein: 7 g/dL (ref 6.4–8.9)

## 2022-09-22 LAB — INFLUENZA A AND B, COVID, RSV COMBINATION ASSAY, NAA
Influenza A: NEGATIVE
Influenza B: NEGATIVE
RSV: NEGATIVE
SARS-CoV-2: NEGATIVE

## 2022-09-22 LAB — B NATRIURETIC PEPTIDE: BNP: 36 pg/mL (ref 0–100)

## 2022-09-22 LAB — LIPASE: Lipase: 6 U/L (ref 4–82)

## 2022-09-22 LAB — PROTIME-INR
INR: 1 (ref 0.9–1.1)
Protime: 13.5 seconds (ref 12.1–15.1)

## 2022-09-22 MED ORDER — acetaminophen (TYLENOL) tablet 975 mg
325 | Freq: Once | ORAL | Status: AC
Start: 2022-09-22 — End: 2022-09-22
  Administered 2022-09-22: 22:00:00 via ORAL

## 2022-09-22 MED ORDER — cephALEXin (KEFLEX) 500 MG capsule
500 | ORAL_CAPSULE | Freq: Four times a day (QID) | ORAL | 0 refills | Status: AC
Start: 2022-09-22 — End: 2022-09-29

## 2022-09-22 MED ORDER — electrolyte-R (pH 7.4) (NORMOSOL-R pH 7.4) IV bolus 1,000 mL
Freq: Once | INTRAVENOUS | Status: AC
Start: 2022-09-22 — End: 2022-09-22
  Administered 2022-09-22: 22:00:00 via INTRAVENOUS

## 2022-09-22 MED ORDER — proCHLORPERazine (COMPAZINE) injection 10 mg
10 | Freq: Four times a day (QID) | INTRAMUSCULAR | Status: AC | PRN
Start: 2022-09-22 — End: 2022-09-23
  Administered 2022-09-22: 22:00:00 via INTRAVENOUS

## 2022-09-22 MED ORDER — atorvastatin (LIPITOR) 80 MG tablet
80 | ORAL_TABLET | Freq: Every day | ORAL | 0 refills | Status: AC
Start: 2022-09-22 — End: ?

## 2022-09-22 MED ORDER — aspirin 325 MG EC tablet
325 | ORAL_TABLET | Freq: Every day | ORAL | 0 refills | 30.00000 days | Status: AC
Start: 2022-09-22 — End: ?

## 2022-09-22 MED ORDER — OMNIPAQUE (iohexol) 350 mg iodine/mL 150 mL
350 | Freq: Once | INTRAVENOUS | Status: AC | PRN
Start: 2022-09-22 — End: 2022-09-22
  Administered 2022-09-22: 22:00:00 via INTRAVENOUS

## 2022-09-22 MED ORDER — diphenhydrAMINE (BENADRYL) injection 25 mg
50 | Freq: Once | INTRAMUSCULAR | Status: AC
Start: 2022-09-22 — End: 2022-09-22
  Administered 2022-09-22: 22:00:00 via INTRAVENOUS

## 2022-09-22 MED FILL — PROCHLORPERAZINE EDISYLATE 10 MG/2 ML (5 MG/ML) INJECTION SOLUTION: 10 10 mg/2 mL (5 mg/mL) | INTRAMUSCULAR | Qty: 2

## 2022-09-22 MED FILL — TYLENOL 325 MG TABLET: 325 325 mg | ORAL | Qty: 3

## 2022-09-22 MED FILL — DIPHENHYDRAMINE 50 MG/ML INJECTION SOLUTION: 50 50 mg/mL | INTRAMUSCULAR | Qty: 1

## 2022-09-22 MED FILL — OMNIPAQUE 350 MG IODINE/ML INTRAVENOUS SOLUTION: 350 350 mg iodine/mL | INTRAVENOUS | Qty: 150

## 2022-09-22 NOTE — ED Provider Notes (Incomplete)
ED Reassessment Note    [name] is a 67 y.o. male patient who presented to the Emergency Department. This patient was initially seen by an off-going provider. Please see that provider's note for details regarding the initial history, physical exam and ED course.    Care of this patient was signed out to me. I reviewed initial history and physical examination information performed by the off-going provider. The patient was evaluated by myself and the ED Attending Physician, Dr. Lamar Sprinkles.     ED Course and MDM     [name] is a 67 y.o. male with history of COPD, DM, left MCA with aphasia who presents with a chief complaint of headache who presented to the emergency department with headache. Please refer to the off-going provider's note for their initial history, physical exam, diagnostic workup, and ED course.    Workup by performed by the previous provider included stroke workup including CTA neck, CTA head, CT head without contrast demonstrating    1.  Multifocal left M1 stenosis with poor opacification of the distal left MCA branches, similar to prior.   2.  Additional multifocal intracranial atherosclerotic disease including moderate multifocal stenosis of the bilateral posterior cerebral arteries and anterior cerebral arteries as detailed above.   3.  No intracranial aneurysm, AV shunting lesion, or new large vessel occlusion.     1.  Focal hypoattenuation in the right anterior basal ganglia appears new from prior, suspicious for age-indeterminate lacunar infarct. This could be better evaluated with MRI.   2.  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   3.  Similar sequelae of chronic small vessel disease and diffuse cortical atrophy.   4.  No acute intracranial hemorrhage or mass effect.     Patient also underwent a chest x-ray and CT abdomen pelvis for abdominal pain that showed no acute abnormalities patient also underwent a lab workup that was  only notable for a urinalysis with 21 white blood cells and moderate blood with concern for possible UTI.  The patient so far has been treated with migraine cocktail with improvement of his headache and 1 L of fluids.     At this time, the following is pending:   - Neurology evaluation and final recommendations for disposition.  - Urine culture that will likely result following discharge.  - Reassessment and Disposition     ED Course & Medical Decision Making Following Reassessment:  ED Course as of 09/22/22 2234   Sun Sep 22, 2022   2102 Upon taking over this patient, patient is well-appearing with stable vitals and no acute change in clinical status since last evaluated.  Patient is pending neurology final recommendations, and urine culture that most likely will not come back prior to discharge, and reevaluation for discharge if deemed fit by neurology.  Neurology was spoken with by outgoing provider and they have agreed to evaluate the patient.   2232 Spoke with neurology and they think that this is likely an age indeterminant small vessel stroke and not an acute stroke at this time.  Therefore, they recommend that the patient be discharged with close outpatient follow-up with neurology that they will reach out to the patient to schedule.  They state that he should continue to take aspirin 325mg  and atorvastatin 80mg  until he sees neurology outpatient.       Medical Decision Making  Problems Addressed:  Nonintractable headache, unspecified chronicity pattern, unspecified headache type: complicated acute  illness or injury    Amount and/or Complexity of Data Reviewed  Labs: ordered.  Radiology: ordered.    Risk  OTC drugs.  Prescription drug management.        Summary of Treatment in ED:    Medications   proCHLORPERazine (COMPAZINE) injection 10 mg (10 mg Intravenous Given 09/22/22 1802)   OMNIPAQUE (iohexol) 350 mg iodine/mL 150 mL (150 mLs Intravenous Given 09/22/22 1730)   electrolyte-R (pH 7.4) (NORMOSOL-R pH  7.4) IV bolus 1,000 mL (1,000 mLs Intravenous New Bag 09/22/22 1802)   diphenhydrAMINE (BENADRYL) injection 25 mg (25 mg Intravenous Given 09/22/22 1802)   acetaminophen (TYLENOL) tablet 975 mg (975 mg Oral Given 09/22/22 1802)       Impression     1. Nonintractable headache, unspecified chronicity pattern, unspecified headache type           Plan   Discharge in stable/improved condition  Patient will be discharged in stable or improved condition. The workup, treatment, and diagnoses were discussed with the patient and/or their family members. The patient agrees to the plan and questions were addressed and answered. The patient is instructed to return to the emergency department should their symptoms worsen or any concern they believes warrants acute physician evaluation. Specific return precautions discussed include: New functional deficits, worsening slurred speech, worsening weakness or inability to perform activities of daily living, fever, visual disturbances or any other new concerns.    Follow-Up/Future Appointments:  Discussed that the patient should follow-up neurology outpatient.  They are scheduled to reach out to schedule a follow-up appointment.  No follow-up provider specified.  No future appointments.    This note was dictated using voice-recognition software, which occasionally leads to inadvertent typographic errors.      Randel Books, MD, MD  PGY-1 Emergency Medicine

## 2022-09-22 NOTE — ED Provider Notes (Signed)
ED Attending Attestation Note    Date of service:  09/22/2022    This patient was seen by the resident physician.  I have seen and examined the patient, agree with the workup, evaluation, management and diagnosis. The care plan has been discussed and I concur.     My assessment reveals a 67 y.o. male with a history of multiple strokes and significant aphasia.  Per family patient was indicating he had headache earlier today.  They also state he has generalized weakness greater than usual.  Neither left nor right seems a specifically weaker than baseline.    Also, on examination the patient does grimace somewhat to palpation of the lower quadrants of the abdomen bilaterally.  He does not have rebound or guarding.

## 2022-09-22 NOTE — Consults (Signed)
[facility]  Department of Neurology and Rehab Medicine  Initial Neurology Consult Note     11:03 PM  09/22/2022  Patient:[name]  LOS: 0 days    Reason for Consult new hypodensity  House Officer:  Irineo Axon, MD   Requesting MD/Contact #:  No att. providers found    Impression and Recommendations   [name] is a 67 y.o. male on hospital day 0.      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 CTA head neck significant for known multifocal L M1 stenosis, similar to previous imaging in 09/2021, as well as bilateral multifocal intracranial atherosclerotic disease of the bilateral PCA's, ACA's. Head CT revealed focal hypoattenuation in the right anterior basal ganglia appears new from prior, suspicious for age-indeterminate lacunar infarct. 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      In terms of etiology, small vessel disease seems the most likely etiology given the location, known risk factors (age, HTN, smoking history, DM). Patient's known large vessel disease is not in the correlating territory of new hypodensity. Cardioembolic not fully excluded at this time, but very low suspicion. Even is this age indeterminate hypodensity was determined to be acute on MRI, patient would not be a candidate for limited course of DAPT (per [facility]) given baseline high NIH. As such, nothing to add to his current management from an inpatient perspective.     Recommendations:  - Patient is safe for discharge from the ED from a Neurology perspective   - Recommend patient continue  daily asa 325mg , atorvastatin 80mg , appreciate if ED provider is willing to provide limited script for these medications while follow up is arranged  - Will arrange outpatient follow up  - Recommend continued management of small vessel risk factors with patient's PCP    We appreciate the opportunity to help in the care of [name]. If you have any questions, please page [phone], and the covering resident will respond promptly.     Signed,  Irineo Axon, DO  [facility]  11:03 PM 09/22/2022,     Chief Complaint and History of the Present Illness     Chief Complaint, Reason for Consult: new hypodensity on CT head    [name] is a 67 y.o., with a past medical history of L MCA strokes (2017, 02/2021, and 03/2021) with baseline residual aphasia, L occipital/corona radiate strokes (09/2021), HTN, DM, depression who intitally presented to the ED for occipital headache, found to have an age indeterminate hypodesnity in the right basal ganglia.     Per ED provider history: presents with a chief complaint of headache. Bilateral occipital headache that started this morning, throbbing sensation, 9/10 in pain, intermittent. Initially thought it was due to high BP, however not resolved after taking morning BP meds. Partner noticed that he had some difficulty standing and holding himself up this morning. Reports that his aphasia is unchanged. Having some loose stools lately. On aspirin. Denies nausea, vomiting, dysuria, increased urinary frequency.    Attempted to contact significant other for further collateral but was unable to be reached.  In the ED, workup was significant for abdominal pain, possible UTI, CT abdomen and pelvis without causative pathology. Examination at that time was similar to previous.     Other imaging from ED workup as below:  1) CTA head neck significant for known multifocal L M1 stenosis, similar to previous imaging in 09/2021, as well as bilateral multifocal  intracranial atherosclerotic disease of the bilateral PCA's, ACA's.   2) Head CT revealed focal hypoattenuation in the right anterior basal ganglia appears new from prior, suspicious for age-indeterminate lacunar infarct. 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    Patient's headache resolved with administration of headache cocktail. Neurology was asked to evaluate patient given new imaging findings.      Histories     Medical:   Past Medical History:   Diagnosis Date    COPD (chronic obstructive pulmonary disease) (CMS-HCC)     Diabetes mellitus (CMS-HCC)     Dysphagia     GERD (gastroesophageal reflux disease)     Hematuria     Hepatitis     Hypertension         Surgical:    has a past surgical history that includes Foot surgery (Left); Esophagogastroduodenoscopy (N/A, 11/19/2013); and Hernia repair.     Family:  family history is not on file.    Social:   reports that he has been smoking cigarettes. He has a 1.3 pack-year smoking history. He has never used smokeless tobacco. He reports that he does not drink alcohol and does not use drugs.    Medications and Allergies     Allergies   Allergen Reactions    Penicillins Hives     Tolerates cephalosporins (cephalexin, cefdinir, ceftriaxone)       PTA Medications:  (Not in a hospital admission)      Current Medications:  Scheduled Meds:  Continuous Infusions:  PRN Meds:.proCHLORPERazine    Physical Exam:   Temp:  [97.9 F (36.6 C)] 97.9 F (36.6 C)  Heart Rate:  [56-87] 56  Resp:  [18] 18  BP: (154-156)/(84-92) 156/89  Wt Readings from Last 3 Encounters:   04/05/21 179 lb (81.2 kg)   03/19/21 180 lb (81.6 kg)   02/05/21 181 lb (82.1 kg)     Vital signs: BP, HR, and RR reviewed    Mental status exam:  Appearance: Asleep but arousal to verbal stimuli. Well-groomed. No acute distress.  Language: Follows simple commands, will mimic; global aphasia (expressive>receptive)    Cranial nerve exam:  CN II: Visual fields grossly  full  CN III/IV/VI: Extraocular movements intact; PERRL.   CN VII: Face is symmetric.  CN VIII: Hearing intact to voice.    Motor exam:    Patient intermittently follows with motor examination, primarily by mimicking. Moves all four extremities spontaneously antigravity at least 3/5. Formal strength examination limited by understanding. No pronator drift.    Sensory exam:  Intact to light touch in all four extremities.    Deep tendon reflexes (R/L):  Biceps: 3+ / 3+  BR: 3+ / 3+  Triceps: 2+ / 2+  Pateller: 3+ / 3+ (with cross bilaterally)  Ankle: 2+ / 2+  Clonus (ankle): -/ -    Coordination: Patient unable to understand directions for formal FTN, but does reach toward target without dysmetria     Gait/station: Deferred, normal on ED provider assessment     NIH Stroke Scale  1a  Level of Consciousness: 1=not alert but arousable by minor stimulation to obey, answer or respond   1b. LOC Questions:  2=Performs neither task correctly   1c. LOC Commands: 2=Performs neither task correctly   2.  Best Gaze: 0=normal   3. Visual: 0=No visual loss   4. Facial Palsy: 0=Normal symmetric movement   5a. Motor left arm: 0=No drift, limb holds 90 (or 45) degrees for full 10 seconds   5b.  Motor right arm: 0=No drift, limb holds 90 (or 45) degrees for full 10 seconds   6a. Motor left leg: 0=No drift, limb holds 90 (or 45) degrees for full 10 seconds   6b  Motor right leg:  0=No drift, limb holds 90 (or 45) degrees for full 10 seconds   7. Limb Ataxia: 0=Absent   8.  Sensory: 0=Normal; no sensory loss   9. Best Language:  2=Severe aphasia   10. Dysarthria: 2=Severe; patient speech is so slurred as to be unintelligible in the absence of or our of proportion to any dysphagia, or is mute/anarthric   11. Extinction and Inattention: 0=No abnormality         Total:   9       Labs:   All laboratory findings from the past 24 hours and prior as well as all imaging, diagnostic studies and procedures were reviewed  personally by me and the consult team.     Lab Results   Component Value Date    GLUCOSE 89 09/22/2022    BUN 13 09/22/2022    CO2 20 (L) 09/22/2022    CREATININE 1.27 09/22/2022    K 3.5 09/22/2022    NA 137 09/22/2022    CL 106 09/22/2022    CALCIUM 8.8 09/22/2022     No results found for: GLUFAST  Lab Results   Component Value Date    WBC 6.6 09/22/2022    HGB 14.9 09/22/2022    HCT 43.2 09/22/2022    MCV 87.0 09/22/2022    PLT 183 09/22/2022     Lab Results   Component Value Date    INR 1.0 09/22/2022     Lab Results   Component Value Date    CHOLTOT 200 10/18/2021    TRIG 50 10/18/2021    HDL 48 (L) 10/18/2021    LDL 142 10/18/2021     Lab Results   Component Value Date    HGBA1C 5.5 10/18/2021     No results found for: FOLATE, VITAMINB12  Lab Results   Component Value Date    VITD25H 16.5 (L) 04/07/2021     No results found for: HIV1X2  No results found for: HIV1O2AB  No results found for: HIV1O2QUAL   No results found for: HIV12ABAGN  No results found for: RPRTITER  No results found for: RPR  @RESUFAST (TREPIA)  @RESUFAST (FTAABS)    IMAGING  CT Angio Neck W and or WO   Final Result   IMPRESSION:      Neck   1.  Mild atherosclerotic disease with no significant carotid or vertebral artery stenosis.   2.  No dissection or pseudoaneurysm.      Head   1.  Multifocal left M1 stenosis with poor opacification of the distal left MCA branches, similar to prior.   2.  Additional multifocal intracranial atherosclerotic disease including moderate multifocal stenosis of the bilateral posterior cerebral arteries and anterior cerebral arteries as detailed above.   3.  No intracranial aneurysm, AV shunting lesion, or new  large vessel occlusion.      Report Verified by: Micheal Likens, DO at 09/22/2022 8:40 PM EDT      CT Angio Head W and or WO   Final Result   IMPRESSION:      Neck   1.  Mild atherosclerotic disease with no significant carotid or vertebral artery stenosis.   2.  No dissection or  pseudoaneurysm.      Head   1.  Multifocal left M1 stenosis with poor opacification of the distal left MCA branches, similar to prior.   2.  Additional multifocal intracranial atherosclerotic disease including moderate multifocal stenosis of the bilateral posterior cerebral arteries and anterior cerebral arteries as detailed above.   3.  No intracranial aneurysm, AV shunting lesion, or new large vessel occlusion.      Report Verified by: Micheal Likens, DO at 09/22/2022 8:40 PM EDT      CT Head WO contrast   Final Result   IMPRESSION:      1.  Focal hypoattenuation in the right anterior basal ganglia appears new from prior, suspicious for age-indeterminate lacunar infarct. This could be better evaluated with MRI.   2.  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   3.  Similar sequelae of chronic small vessel disease and diffuse cortical atrophy.   4.  No acute intracranial hemorrhage or mass effect.         Report Verified by: Charlean Sanfilippo, MD at 09/22/2022 7:32 PM EDT      CT Abdomen and Pelvis With IV contrast   Final Result   IMPRESSION:      1.  No acute abnormality in the abdomen and pelvis.   2.  Similar nodularity of the liver contour suspicious for underlying fibrosis/cirrhosis.   3.  Additional incidental findings as above.         Report Verified by: Micheal Likens, DO at 09/22/2022 7:41 PM EDT      X-ray Portable Chest   Final Result   IMPRESSION:    No acute cardiopulmonary abnormality.      Report Verified by: Fulton Reek, MD at 09/22/2022 4:27 PM EDT         @CTANGIORESULTS @   MRI Head limited WO contrast    Result Date: 10/17/2021  IMPRESSION: 1.  Punctate foci of acute ischemia in the left occipital subcortical region and left corona radiata. No mass effect. 2.  Moderate small vessel disease and remote infarcts as described. Report Verified by: Larey Dresser, MD at 10/17/2021 7:17 AM EDT    MRI Head WO contrast    Result Date: 03/31/2021  IMPRESSION: 1.   Acute infarct involving the left parietal-temporal region. Left M2 occlusion with abnormal flow void is redemonstrated. 2.  Interval decrease in spotty diffusion signal in the left occipital lobe related to evolving subacute infarct. CRITICAL RESULT: Acute infarct involving the left parietal-temporal region.  This finding was discussed with Joya Martyr, MD on  03/31/2021 2:48 AM EST by telephone. They confirmed that they understood the findings communicated to them.  #[id number]# Approved by Ephriam Jenkins, MD on 03/31/2021 2:59 AM EST I have personally reviewed the images and I agree with this report. Report Verified by: Charlean Sanfilippo, MD at 03/31/2021 3:20 AM EST    MRI Head WO contrast    Addendum Date: 03/20/2021    ** ADDENDUM #1** The impression should read: IMPRESSION: 1. Recent Left parieto-occipital infarct. 2. No intracranial mass or hemorrhage. Report Verified  by: Burt Ek, MD at 03/20/2021 12:19 PM EST** ORIGINAL REPORT ** EXAM: MRI BRAIN WO CONTRAST INDICATION: Stroke, follow up TECHNIQUE:  MRI brain performed including multiplanar T1 and T2 weighted sequences. COMPARISON: CT 03/18/2021 FINDINGS: Ventricles unchanged, borderline in size. No hematoma. Small microbleed right pons. Area of 4 x 1.5 x 2 cm cortical diffusion restriction left parieto-occipital. Bilateral symmetric foci small and confluent areas of T2/FLAIR hyperintensity cerebral hemisphere white matter bilaterally, thalami, pons, midbrain with multiple  lacunar changes. Prominent perivascular spaces throughout hemisphere white matter. No large artery or major dural sinus flow signal abnormality. Orbits and paranasal sinuses unremarkable. Temporal bones exhibit bilateral mastoid fluid effusion. IMPRESSION: 1.  Normal MRI brain without contrast. 2.  No intracranial mass, signal abnormality, or hemorrhage. Report Verified by: Burt Ek, MD at 03/20/2021 8:41 AM EST    Result Date: 03/20/2021  IMPRESSION: 1.  Normal MRI brain without contrast. 2.  No  intracranial mass, signal abnormality, or hemorrhage. Report Verified by: Burt Ek, MD at 03/20/2021 8:41 AM EST

## 2022-09-22 NOTE — ED Notes (Signed)
Bed: Z61W  Expected date:   Expected time:   Means of arrival:   Comments:  Triage:[place]

## 2022-09-22 NOTE — ED Notes (Signed)
Pt discharged from emergency department. AVS printed and given to pt. Told to return to ED for any new or worsening chest pain, SOB, syncope, or other concerns. Resident called and spoke to the patient's caregiver and explained medications prescribed and discharge instructions. Patient's caregiver verbalized understanding. PIV removed. Wheeled out to entrance in wheelchair and helped into caregivers vehicle.

## 2022-09-22 NOTE — Discharge Instructions (Addendum)
1. Today you were seen at the [facility] Emergency Department for weakness and headache. Please return if you begin to experiencing worsening of your symptoms including: New functional deficits, worsening slurred speech, worsening weakness or inability to perform activities of daily living, fever, visual disturbances or any other new concerns.  2.  Please take your 325 aspirin and 80 mg atorvastatin daily to ensure no recurrence of stroke.  3.  Neurology will reach out to you to schedule a follow-up appointment, if you do not hear from them please call them with the number in this discharge summary.

## 2022-09-22 NOTE — ED Triage Notes (Signed)
Family states patient woke up today more weak than usual and unable to walk any distance.  Patient has had multiple strokes in the past and has a history of aphasia. Patient is a poor historian and family knows history.  No facial droop noted and grips weak bilaterally.

## 2022-09-22 NOTE — Other (Signed)
[facility]  MEDICAL SCREENING EXAM    Date of Service: 09/22/2022    Reason for Visit: Weakness      MSE Plan     Patient evaluated from the lobby for a medical screening exam.  In short, this is a patient presenting with generalized weakness that started this morning along with a HA. To further evaluate their complaints, the following orders have been placed:     Labs Reviewed   BASIC METABOLIC PANEL   CBC   DIFFERENTIAL   HIGH SENSITIVITY TROPONIN   HIGH SENSITIVITY TROPONIN   B NATRIURETIC PEPTIDE   PROTIME-INR   INFLUENZA A AND B, COVID, RSV COMBINATION ASSAY, NAA   URINALYSIS-MACROSCOPIC W/REFLEX TO MICROSCOPIC     X-ray Portable Chest    (Results Pending)   CT Angio Neck W and or WO    (Results Pending)   CT Angio Head W and or WO    (Results Pending)   CT Head WO contrast    (Results Pending)     No results found for this or any previous visit (from the past 4160 hour(s)).      Based on the patient's presentation, they have been identified as needing a bed urgently.    Brief HPI     [name] is a 67 y.o. male presenting with c/o HA and generalized weakness that started this morning. Family states that he can generally walk around but has noticed he has not been as mobile with an unsteady gait. Family states that he has been leaning to his left side when he walks. Pt states that he feels generally weak all over    Pertinent Physical Exam     ED Triage Vitals [09/22/22 1449]   Enc Vitals Group      BP 154/84      Heart Rate 87      Resp 18      Temp 97.9 F (36.6 C)      Temp Source Oral      SpO2 100 %      Weight       Height       Head Circumference       Peak Flow       Pain Score       Pain Loc       Pain Education       Exclude from Growth Chart      Physical Exam  Vitals and nursing note reviewed.   Constitutional:       General: He is not in acute distress.     Appearance: Normal appearance. He is normal weight. He is not ill-appearing, toxic-appearing or diaphoretic.    HENT:      Head: Normocephalic and atraumatic.      Right Ear: External ear normal.      Left Ear: External ear normal.      Nose: Nose normal.      Mouth/Throat:      Mouth: Mucous membranes are moist.      Pharynx: Oropharynx is clear.   Eyes:      General:         Right eye: No discharge.         Left eye: No discharge.      Extraocular Movements: Extraocular movements intact.      Conjunctiva/sclera: Conjunctivae normal.      Pupils: Pupils are equal, round, and reactive to light.   Cardiovascular:  Rate and Rhythm: Normal rate and regular rhythm.      Pulses: Normal pulses.      Heart sounds: Normal heart sounds. No murmur heard.     No friction rub. No gallop.   Pulmonary:      Effort: Pulmonary effort is normal. No respiratory distress.      Breath sounds: No stridor. No wheezing, rhonchi or rales.   Chest:      Chest wall: No tenderness.   Abdominal:      General: Abdomen is flat. Bowel sounds are normal. There is no distension.      Palpations: Abdomen is soft.      Tenderness: There is no abdominal tenderness.   Musculoskeletal:         General: No swelling, tenderness, deformity or signs of injury. Normal range of motion.      Cervical back: Normal range of motion and neck supple. No rigidity or tenderness.      Comments: Grip/upper extremity strength symmetric in the upper and lowe extremities   Skin:     General: Skin is warm.      Capillary Refill: Capillary refill takes less than 2 seconds.      Coloration: Skin is not jaundiced or pale.      Findings: No bruising, erythema, lesion or rash.   Neurological:      Mental Status: He is alert.      Cranial Nerves: No cranial nerve deficit.      Sensory: No sensory deficit.      Motor: No weakness.      Coordination: Coordination normal.      Comments: Pt has expressive and receptive aphagia from prior cva's   Psychiatric:         Mood and Affect: Mood normal.         Behavior: Behavior normal.         Thought Content: Thought content normal.          Judgment: Judgment normal.         Patient History     Medical History:   Past Medical History:   Diagnosis Date    COPD (chronic obstructive pulmonary disease) (CMS-HCC)     Diabetes mellitus (CMS-HCC)     Dysphagia     GERD (gastroesophageal reflux disease)     Hematuria     Hepatitis     Hypertension      Surgical History:   Past Surgical History:   Procedure Laterality Date    ESOPHAGOGASTRODUODENOSCOPY N/A 11/19/2013    Procedure: ESOPHAGOGASTRODUODENOSCOPY WITH MAC;  Surgeon: Charlane Ferretti, MD;  Location: [facility] ENDOSCOPY;  Service: Gastroenterology;  Laterality: N/A;    FOOT SURGERY Left     HERNIA REPAIR      about 5 yrs ago      Medications: No current facility-administered medications for this encounter.    Current Outpatient Medications:     ARIPiprazole (ABILIFY) 5 MG tablet, Take 1 tablet (5 mg total) by mouth daily., Disp: , Rfl:     aspirin 81 MG chewable tablet, Chew 1 tablet (81 mg total) by mouth daily with breakfast., Disp: 30 tablet, Rfl: 0    atorvastatin (LIPITOR) 80 MG tablet, Take 1 tablet (80 mg total) by mouth at bedtime., Disp: 30 tablet, Rfl: 0    cloNIDine HCL (CATAPRES) 0.2 MG tablet, Take 1 tablet (0.2 mg total) by mouth 2 times a day., Disp: , Rfl:     clopidogreL (PLAVIX) 75 mg tablet,  Take 1 tablet (75 mg total) by mouth daily., Disp: 30 tablet, Rfl: 6    diltiazem (CARDIZEM CD) 120 MG 24 hr capsule, Take 1 capsule (120 mg total) by mouth daily., Disp: 30 capsule, Rfl: 3    metoprolol succinate (TOPROL-XL) 100 MG 24 hr tablet, Take 1 tablet (100 mg total) by mouth daily., Disp: , Rfl:     nicotine (NICODERM CQ) 14 mg/24 hr, Place 1 patch onto the skin daily., Disp: 28 patch, Rfl: 0    nicotine, polacrilex, (NICORETTE) 2 mg gum, Take 1 each (2 mg total) by mouth every hour as needed for Smoking cessation., Disp: 100 Stick, Rfl: 0    omeprazole (PRILOSEC) 40 MG capsule, Take 1 capsule (40 mg total) by mouth every morning before breakfast. Indications: gastroesophageal reflux  disease, Disp: 30 capsule, Rfl: 0    oxybutynin (DITROPAN-XL) 10 MG 24 hr tablet, Take 1 tablet (10 mg total) by mouth daily., Disp: , Rfl:     tamsulosin (FLOMAX) 0.4 mg Cap, Take 1 capsule (0.4 mg total) by mouth at bedtime. Take 0.4 mg by mouth daily, Disp: 30 capsule, Rfl: 3    varenicline (CHANTIX) 1 mg tablet, Take 1 tablet (1 mg total) by mouth 2 times a day., Disp: , Rfl:   Allergies:   Allergies   Allergen Reactions    Penicillins Hives     Tolerates cephalosporins (cephalexin, cefdinir, ceftriaxone)       Posey Rea, CNP

## 2022-09-22 NOTE — ED Provider Notes (Signed)
[place] ED Note    Date of Service: 09/22/2022    Reason for Visit: Weakness    Nursing notes and triage notes were appropriately reviewed.    Patient History     HPI  [name] is a 67 y.o. male with history of COPD, DM, left MCA with aphasia who presents with a chief complaint of headache. Bilateral occipital headache that started this morning, throbbing sensation, 9/10 in pain, intermittent. Initially thought it was due to high BP, however not resolved after taking morning BP meds. Partner noticed that he had some difficulty standing and holding himself up this morning. Reports that his aphasia is unchanged. Having some loose stools lately. On aspirin. Denies nausea, vomiting, dysuria, increased urinary frequency.     Difficult to gather history due to patient being profoundly aphasic, partner helped corroborate history.     Past Medical History  Past Medical History:   Diagnosis Date    COPD (chronic obstructive pulmonary disease) (CMS-HCC)     Diabetes mellitus (CMS-HCC)     Dysphagia     GERD (gastroesophageal reflux disease)     Hematuria     Hepatitis     Hypertension        Past Surgical History  Past Surgical History:   Procedure Laterality Date    ESOPHAGOGASTRODUODENOSCOPY N/A 11/19/2013    Procedure: ESOPHAGOGASTRODUODENOSCOPY WITH MAC;  Surgeon: Charlane Ferretti, MD;  Location: [facility] ENDOSCOPY;  Service: Gastroenterology;  Laterality: N/A;    FOOT SURGERY Left     HERNIA REPAIR      about 5 yrs ago       Family History  No family history on file.    Social History  AYYUB DILLIE  reports that he has been smoking cigarettes. He has a 1.3 pack-year smoking history. He has never used smokeless tobacco. He reports that he does not drink alcohol and does not use drugs.    Home Medications  Previous Medications    ARIPIPRAZOLE (ABILIFY) 5 MG TABLET    Take 1 tablet (5 mg total) by mouth daily.    ASPIRIN 81 MG CHEWABLE TABLET    Chew 1 tablet (81 mg total) by mouth daily with breakfast.     ATORVASTATIN (LIPITOR) 80 MG TABLET    Take 1 tablet (80 mg total) by mouth at bedtime.    CLONIDINE HCL (CATAPRES) 0.2 MG TABLET    Take 1 tablet (0.2 mg total) by mouth 2 times a day.    CLOPIDOGREL (PLAVIX) 75 MG TABLET    Take 1 tablet (75 mg total) by mouth daily.    DILTIAZEM (CARDIZEM CD) 120 MG 24 HR CAPSULE    Take 1 capsule (120 mg total) by mouth daily.    METOPROLOL SUCCINATE (TOPROL-XL) 100 MG 24 HR TABLET    Take 1 tablet (100 mg total) by mouth daily.    NICOTINE (NICODERM CQ) 14 MG/24 HR    Place 1 patch onto the skin daily.    NICOTINE, POLACRILEX, (NICORETTE) 2 MG GUM    Take 1 each (2 mg total) by mouth every hour as needed for Smoking cessation.    OMEPRAZOLE (PRILOSEC) 40 MG CAPSULE    Take 1 capsule (40 mg total) by mouth every morning before breakfast. Indications: gastroesophageal reflux disease    OXYBUTYNIN (DITROPAN-XL) 10 MG 24 HR TABLET    Take 1 tablet (10 mg total) by mouth daily.    TAMSULOSIN (FLOMAX) 0.4 MG CAP    Take  1 capsule (0.4 mg total) by mouth at bedtime. Take 0.4 mg by mouth daily    VARENICLINE (CHANTIX) 1 MG TABLET    Take 1 tablet (1 mg total) by mouth 2 times a day.       Allergies:   Allergies as of 09/22/2022 - Fully Reviewed 09/22/2022   Allergen Reaction Noted    Penicillins Hives 04/22/2013       Review of Systems   All relevant systems negative except for pertinent positives and negatives as noted in the HPI.    Physical Exam     Vitals:    09/22/22 1449 09/22/22 1900   BP: 154/84 (!) 155/92   BP Location: Left upper arm    Patient Position: Sitting    Pulse: 87 61   Resp: 18    Temp: 97.9 F (36.6 C)    TempSrc: Oral    SpO2: 100% 100%       Physical Exam  Constitutional:       Appearance: Normal appearance.   Eyes:      Extraocular Movements: Extraocular movements intact.      Pupils: Pupils are equal, round, and reactive to light.   Cardiovascular:      Rate and Rhythm: Normal rate and regular rhythm.   Pulmonary:      Effort: Pulmonary effort is normal.       Breath sounds: Normal breath sounds.   Abdominal:      Tenderness: There is abdominal tenderness in the epigastric area, periumbilical area, suprapubic area, left upper quadrant and left lower quadrant. There is no guarding or rebound.   Musculoskeletal:      Cervical back: Normal range of motion and neck supple.   Neurological:      Mental Status: He is alert.      Comments: CN II-XII are grossly intact except for aphasia. Sensation is grossly intact throughout. Moves all four extremities without obvious deficit. Gait is slow but steady.  Negative Romberg. + heel-shin testing. + finger to nose testing.             Diagnostic Studies     Labs:  Please see the EMR for laboratory studies performed in the ED.    Radiology:  Please see the EMR for radiology studies performed in the ED.    Emergency Department Procedures   Procedures    ED Course and MDM     Myers Danny is a 67 y.o. male with a history and presentation as described above in HPI.  The patient was evaluated by myself and the ED Attending Physician, Dr. Arva Myers, and R4 Dr. Jean Myers. All management and disposition plans were discussed and agreed upon.    Initial impression is notable for a well-appearing patient found to be mildly hypertensive 143-155/86-92. Differential included infection, tension headache, new infarct. BMP, BNP, CBC, flu/covid panel, lipase, hepatic function unremarkable. Trop 10 with repeat trop 9.  UA found to have small leukocyte esterase with moderate blood and 21 WBC with rare bacteria. Due to poor history, difficult to assess if having dysuria, however suprapubic tenderness on exam. Urine cultures ordered. Difficult to discern from heel-shin testing and finger to nose testing if patient unable to follow directions due to receptive aphasia or due to cerebellar deficit. Headache cocktail of Compazine, Benadryl, fluids, and Tylenol given and patient's headache improved. Head CT revealed focal hypoattenuation in the right anterior  basal ganglia appears new from prior, suspicious for age-indeterminate lacunar infarct. 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 No acute intracranial hemorrhage or mass effect.  Consulted neurology due to head CT finding and concern for new infarct.     Medical Decision Making  Amount and/or Complexity of Data Reviewed  Labs: ordered.  Radiology: ordered.    Risk  OTC drugs.  Prescription drug management.      Medications received during this ED visit:  Medications   proCHLORPERazine (COMPAZINE) injection 10 mg (10 mg Intravenous Given 09/22/22 1802)   OMNIPAQUE (iohexol) 350 mg iodine/mL 150 mL (150 mLs Intravenous Given 09/22/22 1730)   electrolyte-R (pH 7.4) (NORMOSOL-R pH 7.4) IV bolus 1,000 mL (1,000 mLs Intravenous New Bag 09/22/22 1802)   diphenhydrAMINE (BENADRYL) injection 25 mg (25 mg Intravenous Given 09/22/22 1802)   acetaminophen (TYLENOL) tablet 975 mg (975 mg Oral Given 09/22/22 1802)       Impression     1. Nonintractable headache, unspecified chronicity pattern, unspecified headache type         Plan     Sign-out of patient's ongoing care  1. At this time I am going off-service and will be signing out care of this patient to my colleague Dr. Lady Saucier for further care. We discussed the following patient-care items:  - Pending neuro consult   - Reevaluate and determine disposition      Sheran Luz, MD, MD, PGY-1  [facility]      Sheran Luz, MD  Resident  09/22/22 1610       Sheran Luz, MD  Resident  09/22/22 2106

## 2022-09-22 NOTE — Discharge Instructions (Signed)
[facility] Discharge Follow Up Note:      Patient Information:  Name: [name]  MRN: [id number]  DOB: 09-12-1955    Follow up with:     [facility] ([facility]    Patient to follow-up in:      14 days    Follow-up provider location:   [facility]    Reason:     Stroke    Appointment length:    30 minutes    Discharged to:     Home    Post TCC Plan:    Stroke Clinic      Laural Golden, MD  Neurology Resident

## 2022-09-23 LAB — URINE CULTURE

## 2022-10-02 NOTE — Telephone Encounter (Signed)
Attempted to call patient to schedule. Patient does not have a voicemail set up, so unable to reach or notify.     Laural Golden, MD  [facility]charge [facility] Discharge Follow Up Note:      Patient Information:  Name: [name]  MRN: [id number]  DOB: [birth year]    Follow up with:   [facility] ([facility]    Patient to follow-up in:   14 days    Follow-up provider location: [facility]    Reason: Stroke    Appointment length: 30 minutes    Discharged to: Home    Post TCC Plan: Stroke Clinic      Laural Golden, MD  Neurology Resident

## 2022-10-07 ENCOUNTER — Ambulatory Visit: Payer: Medicare Other | Admitting: Internal Medicine

## 2022-10-08 IMAGING — CT CT HEAD W/O CM
3 series · 15 of 47 positions shown, 18 images · non-contrast
Comparison: None.

CLINICAL DATA: Facial trauma

EXAM:
CT HEAD WITHOUT CONTRAST
CT MAXILLOFACIAL WITHOUT CONTRAST
TECHNIQUE: Multidetector CT imaging of the head and maxillofacial structures
were performed using the standard protocol without intravenous
contrast. Multiplanar CT image reconstructions of the maxillofacial
structures were also generated.

[Series 2: head w o · axial · 0.45mm/px · z∈[+1511,+1651]mm · 9 of 34 slices shown, 12 images]
[im 3/34  brain]
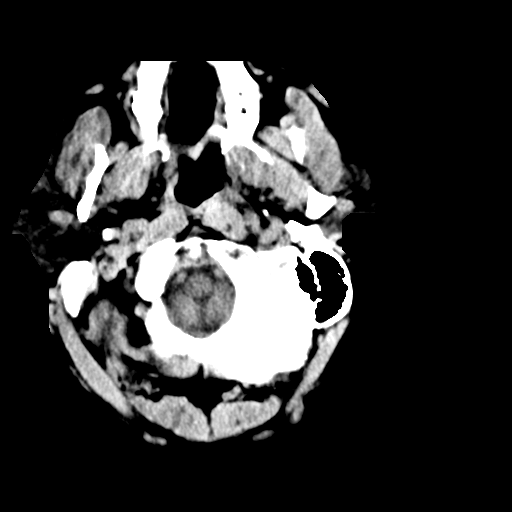
[im 3/34  bone]
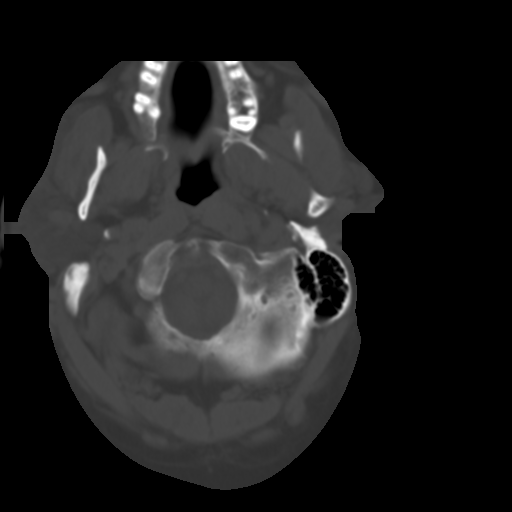
[im 6/34  brain]
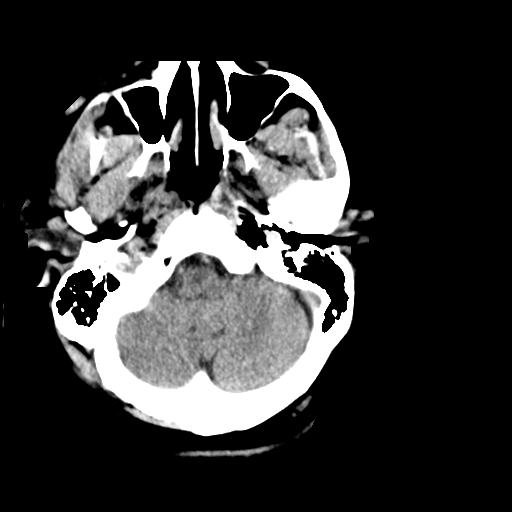
[im 10/34  brain]
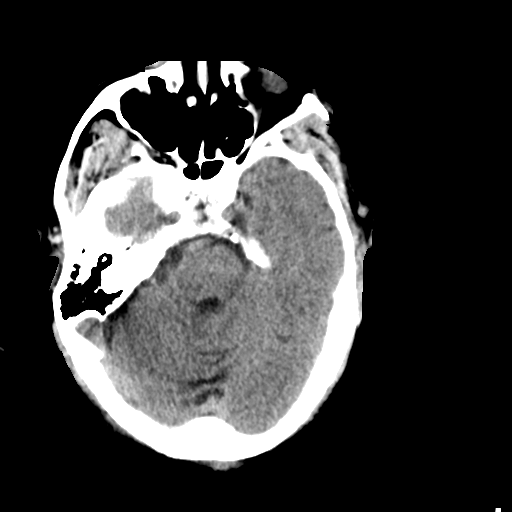
[im 13/34  brain]
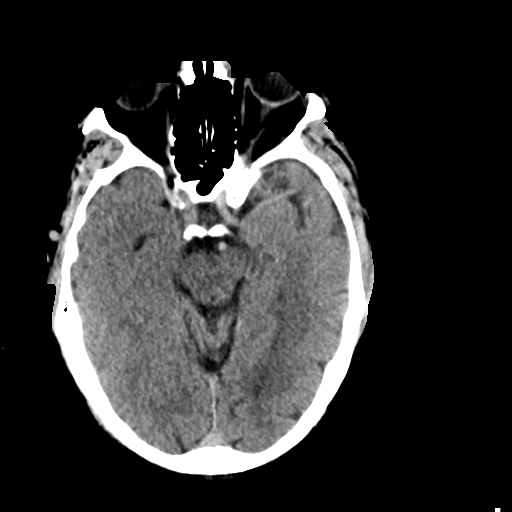
[im 18/34  brain]
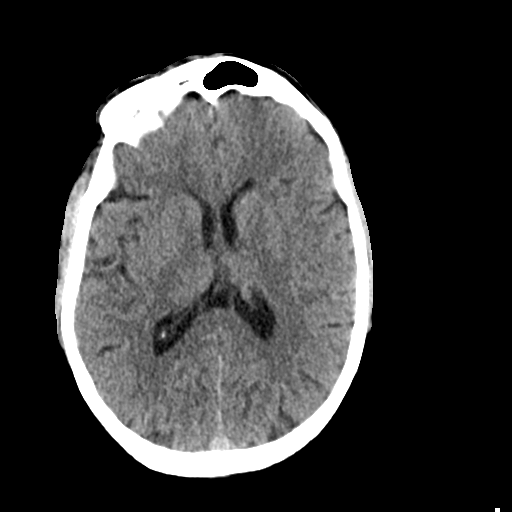
[im 18/34  bone]
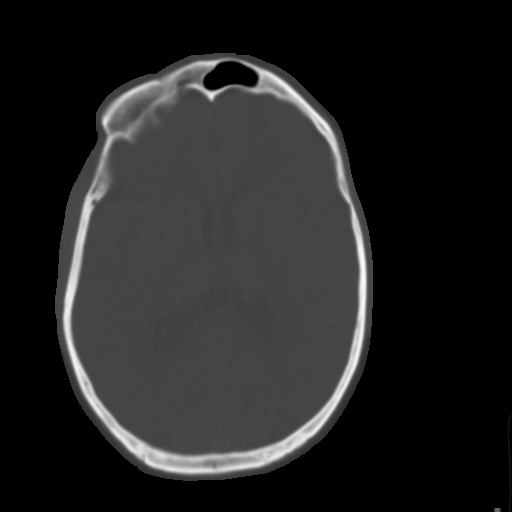
[im 21/34  brain]
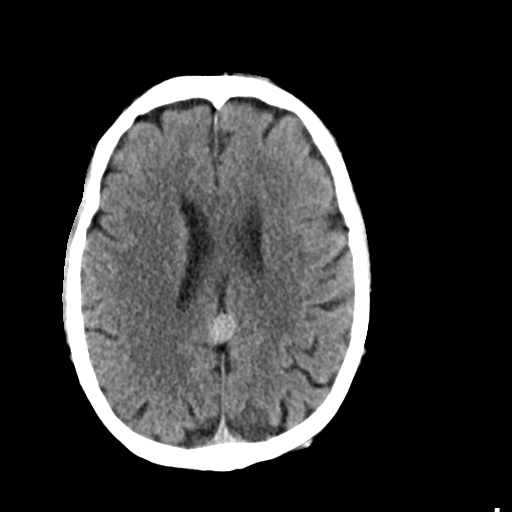
[im 24/34  brain]
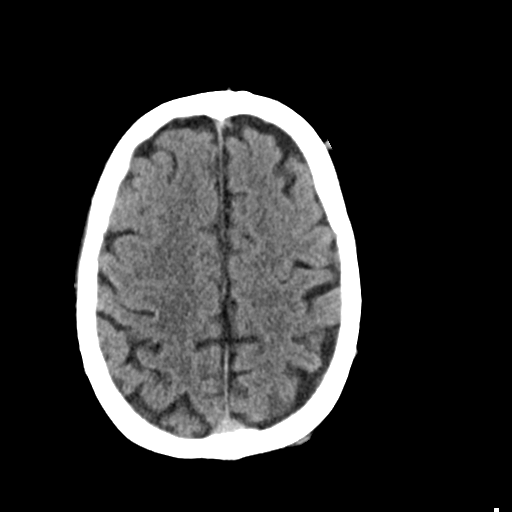
[im 28/34  brain]
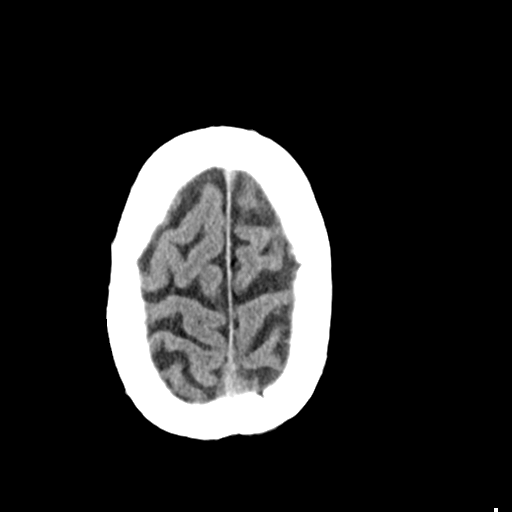
[im 31/34  brain]
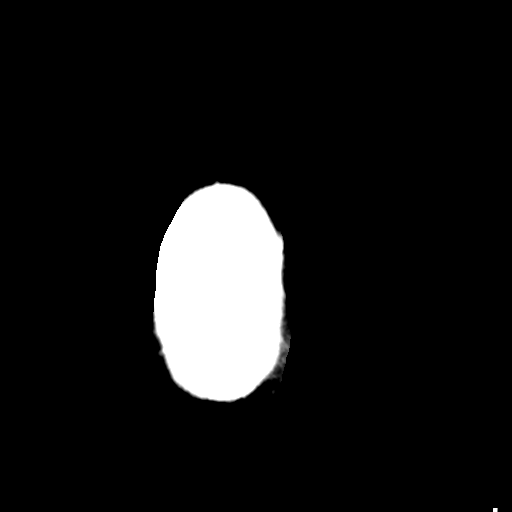
[im 31/34  bone]
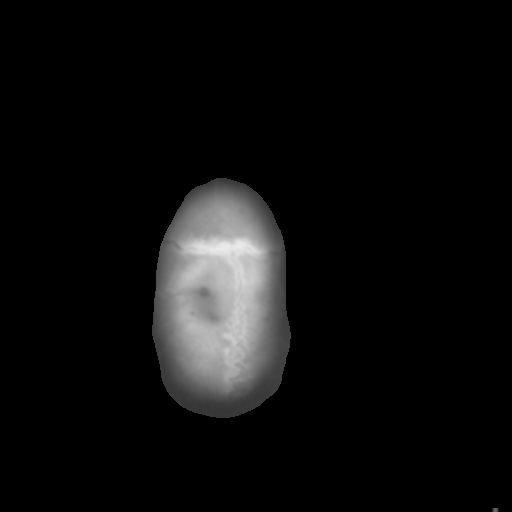

[Series 4: coronal soft · coronal · 0.33mm/px · 3 of 71 slices shown]
[im 24/71  brain]
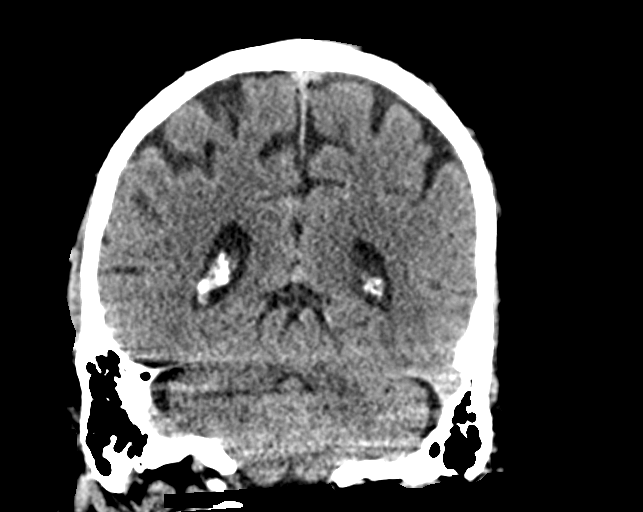
[im 32/71  brain]
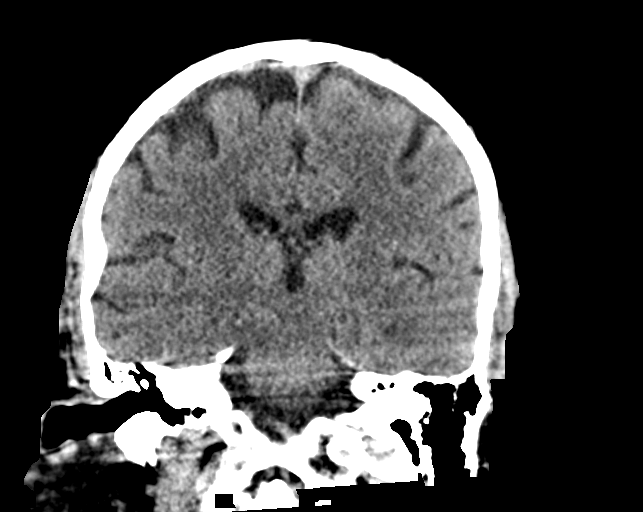
[im 39/71  brain]
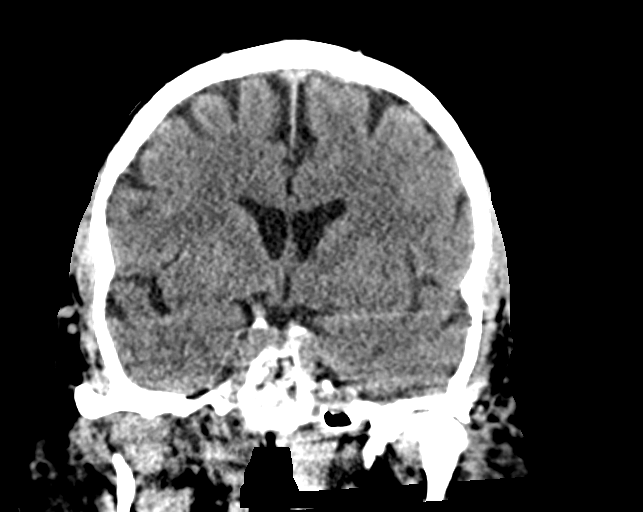

[Series 5: sagittal soft · sagittal · 0.35mm/px · 3 of 60 slices shown]
[im 20/60  brain]
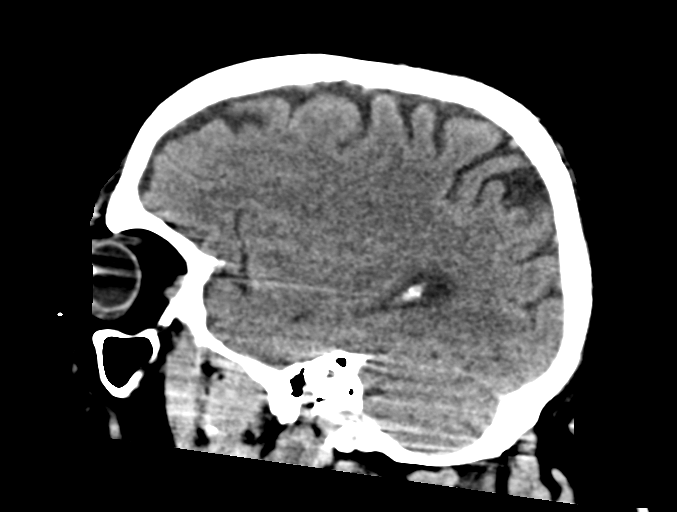
[im 30/60  brain]
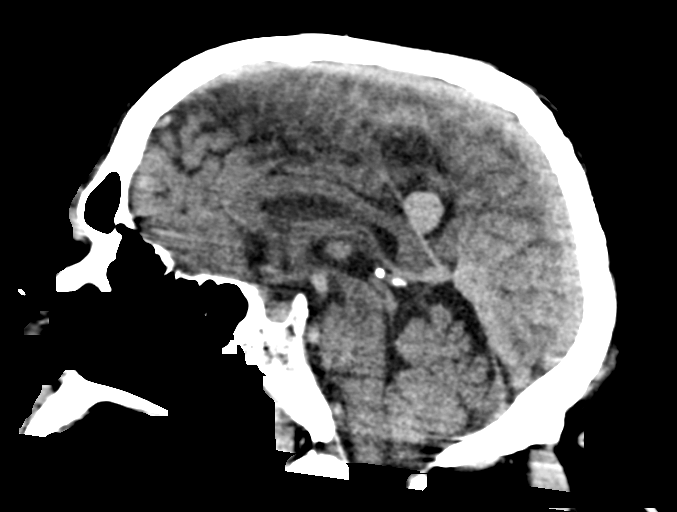
[im 40/60  brain]
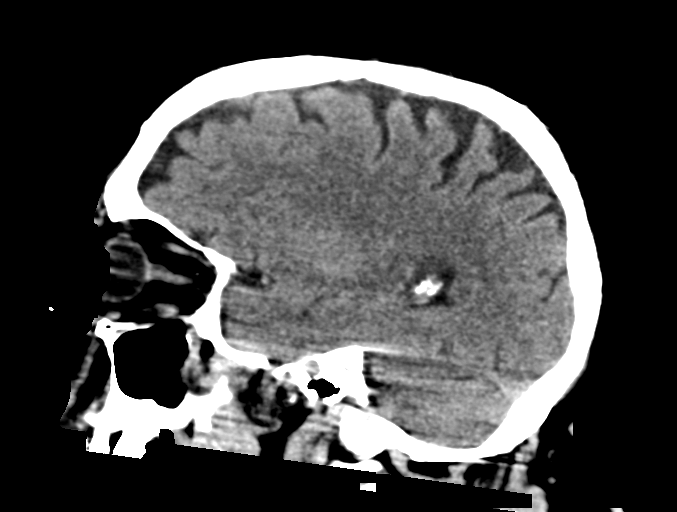

[15 of 47 positions shown; findings below may reference images not displayed]

FINDINGS: CT HEAD FINDINGS

Brain: There is no acute intracranial hemorrhage, mass effect, or
edema. Gray-white differentiation is preserved. Ventricles and sulci
are within normal limits in size and configuration. There is a
hyperdense 1.3 cm mass likely rising from the inferior falx and most
consistent with a meningioma. Abuts the inferior sagittal sinus.

Vascular: No hyperdense vessel or unexpected calcification.

Skull: Unremarkable.

Other: Frontal scalp soft tissue swelling. Mastoid air cells are
clear.

CT MAXILLOFACIAL FINDINGS

Osseous: No acute facial fracture.

Orbits: No intraorbital hematoma.

Sinuses: Small left maxillary sinus retention cyst.

Soft tissues: Left anterior inferior frontal scalp soft tissue
swelling.
IMPRESSION: No evidence of acute intracranial injury.  No acute facial fracture.

1.3 cm meningioma of the inferior falx.

## 2022-10-17 DIAGNOSIS — G8929 Other chronic pain: Secondary | ICD-10-CM | POA: Diagnosis not present

## 2022-10-17 DIAGNOSIS — I10 Essential (primary) hypertension: Secondary | ICD-10-CM | POA: Diagnosis not present

## 2022-10-17 DIAGNOSIS — Z6835 Body mass index (BMI) 35.0-35.9, adult: Secondary | ICD-10-CM | POA: Diagnosis not present

## 2022-10-17 DIAGNOSIS — T07XXXA Unspecified multiple injuries, initial encounter: Secondary | ICD-10-CM | POA: Diagnosis not present

## 2022-10-17 DIAGNOSIS — E6609 Other obesity due to excess calories: Secondary | ICD-10-CM | POA: Diagnosis not present

## 2022-10-17 DIAGNOSIS — N401 Enlarged prostate with lower urinary tract symptoms: Secondary | ICD-10-CM | POA: Diagnosis not present

## 2022-10-17 DIAGNOSIS — J449 Chronic obstructive pulmonary disease, unspecified: Secondary | ICD-10-CM | POA: Diagnosis not present

## 2022-10-17 DIAGNOSIS — R7303 Prediabetes: Secondary | ICD-10-CM | POA: Diagnosis not present

## 2022-10-17 DIAGNOSIS — D329 Benign neoplasm of meninges, unspecified: Secondary | ICD-10-CM | POA: Diagnosis not present

## 2022-10-17 DIAGNOSIS — E782 Mixed hyperlipidemia: Secondary | ICD-10-CM | POA: Diagnosis not present

## 2022-10-24 DIAGNOSIS — K080 Exfoliation of teeth due to systemic causes: Secondary | ICD-10-CM | POA: Diagnosis not present

## 2022-10-27 NOTE — Progress Notes (Unsigned)
Daniel [name], male    DOB: 28-Mar-1955    MRN: [id number]   Brief patient profile:  31 yowm  quit smoking 2015with some cough/sob @ wt around 227  that transiently  improved but worse since ? 2015 referred to pulmonary clinic in [facility]  08/22/2022 by [facility]  [facility]  s/p ? Aecopd  08/02/22   [facility]  08/02/22 rx for aecopd some better   History of Present Illness  08/22/2022  Pulmonary/ 1st office eval/ Montel Vanderhoof / [place] [facility] on  stiolto/albuterol po/ iv/neb  Chief Complaint  Patient presents with   Shortness of Breath   Establish Care  Dyspnea:  ok on flat on surface in a/c Cough: bad to worse for 6 m dry / hacking  all daytime  Sleep: flat bed  / 2 pillows / on side - can't breathe on back (immediated "smothering s choking)  otherwise  SABA use: 3-4 x days/ no neb 02: none  Overt hb  Rec D/C  vasotec and take one half of the new pill = valsartan in place of vasotec tonight  Valsartan 160 mg one daily and ok add or subtract a half if Blood pressure not ideal  Try off stiolto respimat and albuterol pills after  08/25/22 doses  Only use your albuterol as a rescue medication Also  Ok to try albuterol  x 2 puffs x 15 min before an activity (on alternating days)  that you know would usually make you short of breath    For cough > mucinex DM 1200 mg every 12 hours as needed   Schedule PFTs next available    09/17/2022  f/u ov/[place] office/Eliott Amparan re: ? Acei case / GOLD 3 copd  maint on stiolto  needs eos and alpha one screening  Chief Complaint  Patient presents with   Asthmatic bronchitis with component of UACS   Dyspnea:  MMRC1 = can walk nl pace, flat grade, can't hurry or go uphills or steps s sob  / mailbox 50 and flat  Cough: 24 h acutely worse with green mucus s nasal symptoms  Sleeping: on side bed level one pillow  no  resp cc  SABA use: none 02: none needed  Rec Zpak  For cough/ congestion > mucinex dm  up to maximum of  1200 mg every 12 hours as needed  If not all better Try  prilosec otc 20mg   Take 30-60 min before first meal of the day and Pepcid ac (famotidine) 20 mg one after supper bedtime until cough /throat congestion are completely gone for at least a week without the need for cough suppression  Continue stiolto 2 puffs each am for now    10/29/2022  f/u ov/Alvon Nygaard re: GOLD 3 copd maint on stiolto   Chief Complaint  Patient presents with   Follow-up    6 week follow up    Dyspnea:  works out at [employer] bike building up  Cough: [employer]: 2 pillows s  resp cc some HB  SABA use: hardly use  02: none   Lung cancer screening :  referred 10/29/2022     No obvious day to day or daytime variability or assoc excess/ purulent sputum or mucus plugs or hemoptysis or cp or chest tightness, subjective wheeze or overt sinus  symptoms.    Also denies any obvious fluctuation of symptoms with weather or environmental changes or other aggravating or alleviating factors except as outlined above   No unusual exposure hx or h/o childhood  pna/ asthma or knowledge of premature birth.  Current Allergies, Complete Past Medical History, Past Surgical History, Family History, and Social History were reviewed in [employer] record.  ROS  The following are not active complaints unless bolded Hoarseness, sore throat, dysphagia, dental problems, itching, sneezing,  nasal congestion or discharge of excess mucus or purulent secretions, ear ache,   fever, chills, sweats, unintended wt loss or wt gain, classically pleuritic or exertional cp,  orthopnea pnd or arm/hand swelling  or leg swelling, presyncope, palpitations, abdominal pain, anorexia, nausea, vomiting, diarrhea  or change in bowel habits or change in bladder habits, change in stools or change in urine, dysuria, hematuria,  rash, arthralgias, visual complaints, headache, numbness, weakness or ataxia or problems with walking or coordination,  change in mood or  memory.        Current Meds  Medication Sig    acetaminophen (TYLENOL) 500 MG tablet Take 1,000 mg by mouth daily as needed for moderate pain or headache.   albuterol (PROVENTIL) (2.5 MG/3ML) 0.083% nebulizer solution Take 2.5 mg by nebulization every 6 (six) hours as needed for wheezing or shortness of breath.   albuterol (PROVENTIL) 4 MG tablet Take 4 mg by mouth 4 (four) times daily.   albuterol (VENTOLIN HFA) 108 (90 Base) MCG/ACT inhaler Inhale 2 puffs into the lungs every 6 (six) hours as needed for wheezing or shortness of breath.   ALPRAZolam (XANAX) 0.5 MG tablet Take 0.5 mg by mouth at bedtime.   amLODipine (NORVASC) 2.5 MG tablet Take 2.5 mg by mouth daily.   azithromycin (ZITHROMAX) 250 MG tablet Take 2 on day one then 1 daily x 4 days   cetirizine (ZYRTEC) 10 MG tablet Take 1 tablet (10 mg total) by mouth daily.   fluticasone (FLONASE) 50 MCG/ACT nasal spray Place 2 sprays into both nostrils daily.   Glucosamine-Chondroitin (OSTEO BI-FLEX REGULAR STRENGTH PO) Take 1 tablet by mouth daily.   lidocaine (XYLOCAINE) 2 % solution Use as directed 5 mLs in the mouth or throat as needed for mouth pain. Gargle and spit 5 mL as needed for throat pain.   Multiple Vitamin (MULITIVITAMIN WITH MINERALS) TABS Take 1 tablet by mouth daily.   oxyCODONE (OXY IR/ROXICODONE) 5 MG immediate release tablet Take 5 mg by mouth every 4 (four) hours.   STIOLTO RESPIMAT 2.5-2.5 MCG/ACT AERS SMARTSIG:2 Puff(s) By Mouth Daily   Tamsulosin HCl (FLOMAX) 0.4 MG CAPS Take 0.4 mg by mouth at bedtime.    valsartan (DIOVAN) 160 MG tablet Take 1 tablet (160 mg total) by mouth daily.            Past Medical History:  Diagnosis Date   COPD (chronic obstructive pulmonary disease) (HCC)    Hypertension       Objective:    Wts  10/29/2022        281 09/17/2022        279   08/22/22 279 lb (126.6 kg)  06/18/20 270 lb (122.5 kg)  01/27/18 287 lb (130.2 kg)    Vital signs reviewed  10/29/2022  - Note at rest 02 sats  92% on RA   General appearance:     obese very pleasant amb wm/all smiles      HEENT : Oropharynx  clear   Nasal turbinates nl    NECK :  without  apparent JVD/ palpable Nodes/TM    LUNGS: no acc muscle use,  Mild barrel  contour chest wall with bilateral  Distant BS s  audible wheeze and  without cough on insp or exp maneuvers  and mild  Hyperresonant  to  percussion bilaterally     CV:  RRR  no s3 or murmur or increase in P2, and no edema   ABD:  obese soft and nontender    MS:  Nl gait/ ext warm without deformities Or obvious joint restrictions  calf tenderness, cyanosis or clubbing     SKIN: warm and dry without lesions    NEURO:  alert, approp, nl sensorium with  no motor or cerebellar deficits apparent.      Assessment

## 2022-10-29 ENCOUNTER — Ambulatory Visit: Payer: Medicare Other | Admitting: Internal Medicine

## 2022-10-29 ENCOUNTER — Encounter: Payer: Self-pay | Admitting: Internal Medicine

## 2022-10-29 VITALS — BP 116/73 | HR 77 | Ht 74.0 in | Wt 281.0 lb

## 2022-10-29 DIAGNOSIS — K219 Gastro-esophageal reflux disease without esophagitis: Secondary | ICD-10-CM | POA: Diagnosis not present

## 2022-10-29 DIAGNOSIS — I10 Essential (primary) hypertension: Secondary | ICD-10-CM | POA: Diagnosis not present

## 2022-10-29 DIAGNOSIS — Z87891 Personal history of nicotine dependence: Secondary | ICD-10-CM | POA: Insufficient documentation

## 2022-10-29 DIAGNOSIS — J449 Chronic obstructive pulmonary disease, unspecified: Secondary | ICD-10-CM | POA: Diagnosis not present

## 2022-10-29 NOTE — Assessment & Plan Note (Signed)
Rx as of 10/29/2022 = otc's prn with f/u by PCP/ gi prn   >>>  Advised on diet/ bed blocks/ wt loss and f/u as above    Each maintenance medication was reviewed in detail including emphasizing most importantly the difference between maintenance and prns and under what circumstances the prns are to be triggered using an action plan format where appropriate.  Total time for H and P, chart review, counseling, reviewing hfa/smi device(s) and generating customized AVS unique to this office visit / same day charting = 30 min

## 2022-10-29 NOTE — Assessment & Plan Note (Signed)
Quit smoking x 2015 so eligible for LDSCT until 2030> referred to LCS program   Discussed in detail all the  indications, usual  risks and alternatives  relative to the benefits with patient who agrees to proceed with Rx as outlined.

## 2022-10-29 NOTE — Assessment & Plan Note (Signed)
Quit smoking 2010 - try off acei 08/22/2022 and on prn saba - 08/22/2022  After extensive coaching inhaler device,  effectiveness =    75% hfa and has prn neb as well > start stiolto  - PFT's  08/28/22  FEV1 1.67 (43 % ) ratio 0.55  p 0 % improvement from saba p saba prior to study with DLCO  17.61 (59%)   and FV curve classically concave  and ERV 17% at wt 250   - 09/17/2022  After extensive coaching inhaler device,  effectiveness =  75% with hfa (early trigger, short Ti) and stiolto > continue stiolto     Pt is Group B in terms of symptom/risk and laba/lama therefore appropriate rx at this point >>>  stiolto  and progressive increase in aerobic activity / f/u yearly

## 2022-10-29 NOTE — Patient Instructions (Addendum)
To get the most out of exercise, you need to be continuously aware that you are short of breath, but never out of breath, for at least 30 minutes daily. As you improve, it will actually be easier for you to do the same amount of exercise  in  30 minutes so always push to the level where you are short of breath.   My office will be contacting you by phone for referral to Lung cancer screening program   -  [phone] - if you don't hear back from my office within one week please call us back or notify us thru MyChart and we'll address it right away.     Ok to continue reflux medications over the counger  but remember :  Reflux/ heart burn  can be treated with medication, but also with lifestyle changes including elevation of the head of your bed (ideally with 6 -8inch blocks under the headboard of your bed),  Smoking cessation, avoidance of late meals, excessive alcohol, and avoid fatty foods, chocolate, peppermint, colas, red wine, and acidic juices such as orange juice.  NO MINT OR MENTHOL PRODUCTS SO NO COUGH DROPS  USE SUGARLESS CANDY INSTEAD (Jolley ranchers or Stover's or Life Savers) or even ice chips will also do - the key is to swallow to prevent all throat clearing. NO OIL BASED VITAMINS - use powdered substitutes.  Avoid fish oil when coughing.   Discuss this also with your primary provider   Please schedule a follow up visit in 12 months but call sooner if needed

## 2022-10-29 NOTE — Assessment & Plan Note (Signed)
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

## 2022-12-19 DIAGNOSIS — E6609 Other obesity due to excess calories: Secondary | ICD-10-CM | POA: Diagnosis not present

## 2022-12-19 DIAGNOSIS — R7303 Prediabetes: Secondary | ICD-10-CM | POA: Diagnosis not present

## 2022-12-19 DIAGNOSIS — N401 Enlarged prostate with lower urinary tract symptoms: Secondary | ICD-10-CM | POA: Diagnosis not present

## 2022-12-19 DIAGNOSIS — Z6835 Body mass index (BMI) 35.0-35.9, adult: Secondary | ICD-10-CM | POA: Diagnosis not present

## 2022-12-19 DIAGNOSIS — Z23 Encounter for immunization: Secondary | ICD-10-CM | POA: Diagnosis not present

## 2022-12-19 DIAGNOSIS — Z1331 Encounter for screening for depression: Secondary | ICD-10-CM | POA: Diagnosis not present

## 2022-12-19 DIAGNOSIS — Z0001 Encounter for general adult medical examination with abnormal findings: Secondary | ICD-10-CM | POA: Diagnosis not present

## 2022-12-19 DIAGNOSIS — M19042 Primary osteoarthritis, left hand: Secondary | ICD-10-CM | POA: Diagnosis not present

## 2023-01-28 DIAGNOSIS — R6889 Other general symptoms and signs: Secondary | ICD-10-CM | POA: Diagnosis not present

## 2023-01-28 DIAGNOSIS — Z20828 Contact with and (suspected) exposure to other viral communicable diseases: Secondary | ICD-10-CM | POA: Diagnosis not present

## 2023-01-28 DIAGNOSIS — E6609 Other obesity due to excess calories: Secondary | ICD-10-CM | POA: Diagnosis not present

## 2023-01-28 DIAGNOSIS — J069 Acute upper respiratory infection, unspecified: Secondary | ICD-10-CM | POA: Diagnosis not present

## 2023-01-28 DIAGNOSIS — Z6836 Body mass index (BMI) 36.0-36.9, adult: Secondary | ICD-10-CM | POA: Diagnosis not present

## 2023-02-01 ENCOUNTER — Encounter: Payer: Self-pay | Admitting: Emergency Medicine

## 2023-02-01 ENCOUNTER — Ambulatory Visit
Admission: EM | Admit: 2023-02-01 | Discharge: 2023-02-01 | Disposition: A | Discharge status: Home / Self Care | Payer: Medicare Other | Attending: Nurse Practitioner | Admitting: Nurse Practitioner

## 2023-02-01 DIAGNOSIS — H6993 Unspecified Eustachian tube disorder, bilateral: Secondary | ICD-10-CM

## 2023-02-01 DIAGNOSIS — J441 Chronic obstructive pulmonary disease with (acute) exacerbation: Secondary | ICD-10-CM | POA: Diagnosis not present

## 2023-02-01 DIAGNOSIS — J069 Acute upper respiratory infection, unspecified: Secondary | ICD-10-CM

## 2023-02-01 MED ORDER — FLUTICASONE PROPIONATE 50 MCG/ACT NA SUSP: 2.0000 | Freq: Every day | NASAL | 0 refills | Status: DC

## 2023-02-01 MED ORDER — PREDNISONE 20 MG PO TABS: 40.0000 mg | ORAL_TABLET | Freq: Every day | ORAL | 0 refills | Status: AC

## 2023-02-01 MED ORDER — DEXAMETHASONE SODIUM PHOSPHATE 10 MG/ML IJ SOLN
10.0000 mg | INTRAMUSCULAR | Status: AC
  Administered 2023-02-01: 10 mg via INTRAMUSCULAR

## 2023-02-01 NOTE — Discharge Instructions (Addendum)
 You have been given an injection of Decadron  10mg  today. Start the prednisone  on 02/02/23. Take medication as prescribed. I would like for you to start over-the-counter Coricidin HBP for your cough. Increase fluids and allow for plenty of rest. Recommend Tylenol  s needed for pain, fever, or general discomfort. May use normal saline nasal spray to help with nasal congestion.  Recommend using a humidifier at bedtime during sleep to help with cough and nasal congestion.It may also be helpful to sleep elevated while cough symptoms persist.  If symptoms do not improve with this treatment, you may follow-up with your PCP or in this clinic for further evaluation. Follow-up as needed.

## 2023-02-01 NOTE — ED Triage Notes (Signed)
 Bilateral ears stopped up and nasal congestion x 1 week.  Also c/o productive cough. Was tested for covid on Tuesday and was negative.  States his wife tested positive.
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
## 2023-02-14 ENCOUNTER — Encounter: Payer: Self-pay | Admitting: Urology

## 2023-02-14 ENCOUNTER — Ambulatory Visit: Payer: Medicare Other | Admitting: Urology

## 2023-02-14 VITALS — BP 166/91 | HR 78

## 2023-02-14 DIAGNOSIS — R351 Nocturia: Secondary | ICD-10-CM | POA: Diagnosis not present

## 2023-02-14 DIAGNOSIS — N401 Enlarged prostate with lower urinary tract symptoms: Secondary | ICD-10-CM

## 2023-02-14 DIAGNOSIS — R350 Frequency of micturition: Secondary | ICD-10-CM | POA: Diagnosis not present

## 2023-02-14 LAB — URINALYSIS, ROUTINE W REFLEX MICROSCOPIC
Bilirubin, UA: NEGATIVE
Glucose, UA: NEGATIVE
Ketones, UA: NEGATIVE
Nitrite, UA: NEGATIVE
Protein,UA: NEGATIVE
RBC, UA: NEGATIVE
Specific Gravity, UA: 1.01 (ref 1.005–1.030)
Urobilinogen, Ur: 0.2 mg/dL (ref 0.2–1.0)
pH, UA: 6 (ref 5.0–7.5)

## 2023-02-14 LAB — MICROSCOPIC EXAMINATION
Bacteria, UA: NONE SEEN
RBC, Urine: NONE SEEN /[HPF] (ref 0–2)

## 2023-02-14 LAB — BLADDER SCAN AMB NON-IMAGING: Scan Result: 23

## 2023-02-14 MED ORDER — ALFUZOSIN HCL ER 10 MG PO TB24: 10.0000 mg | ORAL_TABLET | Freq: Every day | ORAL | 11 refills | Status: DC

## 2023-02-14 NOTE — Patient Instructions (Signed)

## 2023-02-14 NOTE — Progress Notes (Signed)
02/14/2023 9:42 AM   Daniel [name] [birth year] [id number]  Referring provider: Elfredia Nevins, MD [address] [place],  [place] [address]  nocturia   HPI: [name] is a 68yo here for evaluation of BPH with nocturia. He has been on flomax for 3-4 years. PSA 2.0 IPSS 9 QOL 4 on flomax 0.4mg  daily. Nocturia 3x. Urine stream fair. Occasional straining to urinate. No dysuria or hematuria. UA today shows no RBCs, WBCs, or bacteria.    PMH: Past Medical History:  Diagnosis Date   COPD (chronic obstructive pulmonary disease) (HCC)    Hypertension     Surgical History: Past Surgical History:  Procedure Laterality Date   APPENDECTOMY     COLONOSCOPY N/A 04/24/2017   Procedure: COLONOSCOPY;  Surgeon: West Bali, MD;  Location: [facility];  Service: Endoscopy;  Laterality: N/A;  1:00pm   DG 3RD DIGIT LEFT HAND     ligament torn, surgical repair of middle finger as well.   HEMORRHOID SURGERY     HERNIA REPAIR     RLQ, umbilical    Home Medications:  Allergies as of 02/14/2023   No Known Allergies      Medication List        Accurate as of February 14, 2023  9:42 AM. If you have any questions, ask your nurse or doctor.          acetaminophen 500 MG tablet Commonly known as: TYLENOL Take 1,000 mg by mouth daily as needed for moderate pain or headache.   albuterol (2.5 MG/3ML) 0.083% nebulizer solution Commonly known as: PROVENTIL Take 2.5 mg by nebulization every 6 (six) hours as needed for wheezing or shortness of breath. What changed: Another medication with the same name was removed. Continue taking this medication, and follow the directions you see here. Changed by: Wilkie Aye   ALPRAZolam 0.5 MG tablet Commonly known as: XANAX Take 0.5 mg by mouth at bedtime.   amLODipine 2.5 MG tablet Commonly known as: NORVASC Take 2.5 mg by mouth daily.   cetirizine 10 MG tablet Commonly known as: ZYRTEC Take 1 tablet (10 mg total) by mouth daily.    fluticasone 50 MCG/ACT nasal spray Commonly known as: FLONASE Place 2 sprays into both nostrils daily.   multivitamin with minerals Tabs tablet Take 1 tablet by mouth daily.   OSTEO BI-FLEX REGULAR STRENGTH PO Take 1 tablet by mouth daily.   oxyCODONE 5 MG immediate release tablet Commonly known as: Oxy IR/ROXICODONE Take 5 mg by mouth every 4 (four) hours.   Stiolto Respimat 2.5-2.5 MCG/ACT Aers Generic drug: Tiotropium Bromide-Olodaterol SMARTSIG:2 Puff(s) By Mouth Daily   tamsulosin 0.4 MG Caps capsule Commonly known as: FLOMAX Take 0.4 mg by mouth at bedtime.   valsartan 160 MG tablet Commonly known as: Diovan Take 1 tablet (160 mg total) by mouth daily.        Allergies: No Known Allergies  Family History: Family History  Problem Relation Age of Onset   Seizures Mother    Hypertension Mother    COPD Father    Colon cancer Neg Hx    Gastric cancer Neg Hx    Esophageal cancer Neg Hx     Social History:  reports that he quit smoking about 10 years ago. His smoking use included cigarettes. He started smoking about 50 years ago. He has a 12 pack-year smoking history. He has never used smokeless tobacco. He reports that he does not drink alcohol and does not use drugs.  ROS:  All other review of systems were reviewed and are negative except what is noted above in HPI  Physical Exam: BP (!) 166/91   Pulse 78   Constitutional:  Alert and oriented, No acute distress. HEENT:  AT, moist mucus membranes.  Trachea midline, no masses. Cardiovascular: No clubbing, cyanosis, or edema. Respiratory: Normal respiratory effort, no increased work of breathing. GI: Abdomen is soft, nontender, nondistended, no abdominal masses GU: No CVA tenderness.  Lymph: No cervical or inguinal lymphadenopathy. Skin: No rashes, bruises or suspicious lesions. Neurologic: Grossly intact, no focal deficits, moving all 4 extremities. Psychiatric: Normal mood and affect.  Laboratory  Data: Lab Results  Component Value Date   WBC 7.4 01/27/2018   HGB 15.2 01/27/2018   HCT 48.7 01/27/2018   MCV 98.8 01/27/2018   PLT 226 01/27/2018    Lab Results  Component Value Date   CREATININE 0.90 01/27/2018    No results found for: "PSA"  No results found for: "TESTOSTERONE"  Lab Results  Component Value Date   HGBA1C 5.7 (H) 03/06/2011    Urinalysis    Component Value Date/Time   COLORURINE YELLOW 03/01/2012 1106   APPEARANCEUR CLEAR 03/01/2012 1106   LABSPEC 1.024 03/01/2012 1106   PHURINE 7.0 03/01/2012 1106   GLUCOSEU NEGATIVE 03/01/2012 1106   HGBUR NEGATIVE 03/01/2012 1106   BILIRUBINUR NEGATIVE 03/01/2012 1106   KETONESUR NEGATIVE 03/01/2012 1106   PROTEINUR NEGATIVE 03/01/2012 1106   UROBILINOGEN 1.0 03/01/2012 1106   NITRITE NEGATIVE 03/01/2012 1106   LEUKOCYTESUR NEGATIVE 03/01/2012 1106    No results found for: "LABMICR", "WBCUA", "RBCUA", "LABEPIT", "MUCUS", "BACTERIA"  Pertinent Imaging:  No results found for this or any previous visit.  No results found for this or any previous visit.  No results found for this or any previous visit.  No results found for this or any previous visit.  No results found for this or any previous visit.  No results found for this or any previous visit.  No results found for this or any previous visit.  No results found for this or any previous visit.   Assessment & Plan:    1. Benign prostatic hyperplasia with urinary frequency (Primary) We will trial uroxatral 10mg  qhs - BLADDER SCAN AMB NON-IMAGING - Urinalysis, Routine w reflex microscopic  2. Nocturia We wilkl trial uroxatral 10mg  qhs   No follow-ups on file.  Wilkie Aye, MD  [facility] [place]

## 2023-02-14 NOTE — Progress Notes (Signed)
post void residual=23

## 2023-03-29 DEATH — deceased

## 2023-04-04 ENCOUNTER — Ambulatory Visit: Payer: Medicare Other | Admitting: Urology

## 2023-04-04 VITALS — BP 127/80 | HR 80

## 2023-04-04 DIAGNOSIS — R350 Frequency of micturition: Secondary | ICD-10-CM

## 2023-04-04 DIAGNOSIS — R351 Nocturia: Secondary | ICD-10-CM

## 2023-04-04 DIAGNOSIS — N401 Enlarged prostate with lower urinary tract symptoms: Secondary | ICD-10-CM

## 2023-04-04 LAB — URINALYSIS, ROUTINE W REFLEX MICROSCOPIC
Bilirubin, UA: NEGATIVE
Glucose, UA: NEGATIVE
Ketones, UA: NEGATIVE
Nitrite, UA: NEGATIVE
Protein,UA: NEGATIVE
RBC, UA: NEGATIVE
Specific Gravity, UA: 1.02 (ref 1.005–1.030)
Urobilinogen, Ur: 0.2 mg/dL (ref 0.2–1.0)
pH, UA: 6 (ref 5.0–7.5)

## 2023-04-04 LAB — MICROSCOPIC EXAMINATION: RBC, Urine: NONE SEEN /HPF (ref 0–2)

## 2023-04-04 MED ORDER — DUTASTERIDE 0.5 MG PO CAPS: 0.5000 mg | ORAL_CAPSULE | Freq: Every day | ORAL | 3 refills | Status: AC

## 2023-04-04 MED ORDER — ALFUZOSIN HCL ER 10 MG PO TB24: 10.0000 mg | ORAL_TABLET | Freq: Every day | ORAL | 11 refills | Status: DC

## 2023-04-04 NOTE — Progress Notes (Signed)
 04/04/2023 9:48 AM   Daniel [name] [birth year] [id number]  Referring provider: Elfredia Nevins, MD [address] [place],  [place] [address]  Followup BPH   HPI: Daniel [name] is a 67yo here for followup for BPH and nocturia. Nocturia 1-2x down from 5-6x on uroxatral 10mg  qhs. Urine stream strong. No straining to urinate. IPSS 9 QOL 1. No urinary hesitancy.   PMH: Past Medical History:  Diagnosis Date   COPD (chronic obstructive pulmonary disease) (HCC)    Hypertension     Surgical History: Past Surgical History:  Procedure Laterality Date   APPENDECTOMY     COLONOSCOPY N/A 04/24/2017   Procedure: COLONOSCOPY;  Surgeon: West Bali, MD;  [facility]: [facility];  Service: Endoscopy;  Laterality: N/A;  1:00pm   DG 3RD DIGIT LEFT HAND     ligament torn, surgical repair of middle finger as well.   HEMORRHOID SURGERY     HERNIA REPAIR     RLQ, umbilical    Home Medications:  Allergies as of 04/04/2023   No Known Allergies      Medication List        Accurate as of April 04, 2023  9:48 AM. If you have any questions, ask your nurse or doctor.          STOP taking these medications    tamsulosin 0.4 MG Caps capsule Commonly known as: FLOMAX       TAKE these medications    acetaminophen 500 MG tablet Commonly known as: TYLENOL Take 1,000 mg by mouth daily as needed for moderate pain or headache.   albuterol (2.5 MG/3ML) 0.083% nebulizer solution Commonly known as: PROVENTIL Take 2.5 mg by nebulization every 6 (six) hours as needed for wheezing or shortness of breath.   alfuzosin 10 MG 24 hr tablet Commonly known as: UROXATRAL Take 1 tablet (10 mg total) by mouth at bedtime.   ALPRAZolam 0.5 MG tablet Commonly known as: XANAX Take 0.5 mg by mouth at bedtime.   amLODipine 2.5 MG tablet Commonly known as: NORVASC Take 2.5 mg by mouth daily.   cetirizine 10 MG tablet Commonly known as: ZYRTEC Take 1 tablet (10 mg total) by mouth daily.    fluticasone 50 MCG/ACT nasal spray Commonly known as: FLONASE Place 2 sprays into both nostrils daily.   multivitamin with minerals Tabs tablet Take 1 tablet by mouth daily.   OSTEO BI-FLEX REGULAR STRENGTH PO Take 1 tablet by mouth daily.   oxyCODONE 5 MG immediate release tablet Commonly known as: Oxy IR/ROXICODONE Take 5 mg by mouth every 4 (four) hours.   Stiolto Respimat 2.5-2.5 MCG/ACT Aers Generic drug: Tiotropium Bromide-Olodaterol SMARTSIG:2 Puff(s) By Mouth Daily   valsartan 160 MG tablet Commonly known as: Diovan Take 1 tablet (160 mg total) by mouth daily.        Allergies: No Known Allergies  Family History: Family History  Problem Relation Age of Onset   Seizures Mother    Hypertension Mother    COPD Father    Colon cancer Neg Hx    Gastric cancer Neg Hx    Esophageal cancer Neg Hx     Social History:  reports that he quit smoking about 10 years ago. His smoking use included cigarettes. He started smoking about 50 years ago. He has a 12 pack-year smoking history. He has never used smokeless tobacco. He reports that he does not drink alcohol and does not use drugs.  ROS: All other review of systems were reviewed and are  negative except what is noted above in HPI  Physical Exam: BP 127/80   Pulse 80   Constitutional:  Alert and oriented, No acute distress. HEENT: [place] AT, moist mucus membranes.  Trachea midline, no masses. Cardiovascular: No clubbing, cyanosis, or edema. Respiratory: Normal respiratory effort, no increased work of breathing. GI: Abdomen is soft, nontender, nondistended, no abdominal masses GU: No CVA tenderness.  Lymph: No cervical or inguinal lymphadenopathy. Skin: No rashes, bruises or suspicious lesions. Neurologic: Grossly intact, no focal deficits, moving all 4 extremities. Psychiatric: Normal mood and affect.  Laboratory Data: Lab Results  Component Value Date   WBC 7.4 01/27/2018   HGB 15.2 01/27/2018   HCT 48.7  01/27/2018   MCV 98.8 01/27/2018   PLT 226 01/27/2018    Lab Results  Component Value Date   CREATININE 0.90 01/27/2018    No results found for: "PSA"  No results found for: "TESTOSTERONE"  Lab Results  Component Value Date   HGBA1C 5.7 (H) 03/06/2011    Urinalysis    Component Value Date/Time   COLORURINE YELLOW 03/01/2012 1106   APPEARANCEUR Clear 02/14/2023 0858   LABSPEC 1.024 03/01/2012 1106   PHURINE 7.0 03/01/2012 1106   GLUCOSEU Negative 02/14/2023 0858   HGBUR NEGATIVE 03/01/2012 1106   BILIRUBINUR Negative 02/14/2023 0858   KETONESUR NEGATIVE 03/01/2012 1106   PROTEINUR Negative 02/14/2023 0858   PROTEINUR NEGATIVE 03/01/2012 1106   UROBILINOGEN 1.0 03/01/2012 1106   NITRITE Negative 02/14/2023 0858   NITRITE NEGATIVE 03/01/2012 1106   LEUKOCYTESUR Trace (A) 02/14/2023 0858    Lab Results  Component Value Date   LABMICR See below: 02/14/2023   WBCUA 0-5 02/14/2023   LABEPIT 0-10 02/14/2023   BACTERIA None seen 02/14/2023    Pertinent Imaging:  No results found for this or any previous visit.  No results found for this or any previous visit.  No results found for this or any previous visit.  No results found for this or any previous visit.  No results found for this or any previous visit.  No results found for this or any previous visit.  No results found for this or any previous visit.  No results found for this or any previous visit.   Assessment & Plan:    1. Benign prostatic hyperplasia with urinary frequency (Primary) -continue uroxatral 10mg  and start durasteride 0.5mg  daily - Urinalysis, Routine w reflex microscopic  2. Nocturia Continue uroxatral 10mg  qhs   No follow-ups on file.  Wilkie Aye, MD  [facility] [place]

## 2023-04-08 ENCOUNTER — Encounter: Payer: Self-pay | Admitting: Urology

## 2023-04-08 NOTE — Patient Instructions (Signed)

## 2023-04-21 DIAGNOSIS — K080 Exfoliation of teeth due to systemic causes: Secondary | ICD-10-CM | POA: Diagnosis not present

## 2023-04-21 DIAGNOSIS — M9902 Segmental and somatic dysfunction of thoracic region: Secondary | ICD-10-CM | POA: Diagnosis not present

## 2023-04-21 DIAGNOSIS — M9905 Segmental and somatic dysfunction of pelvic region: Secondary | ICD-10-CM | POA: Diagnosis not present

## 2023-04-21 DIAGNOSIS — M9903 Segmental and somatic dysfunction of lumbar region: Secondary | ICD-10-CM | POA: Diagnosis not present

## 2023-04-21 DIAGNOSIS — M62830 Muscle spasm of back: Secondary | ICD-10-CM | POA: Diagnosis not present

## 2023-04-25 DIAGNOSIS — M9902 Segmental and somatic dysfunction of thoracic region: Secondary | ICD-10-CM | POA: Diagnosis not present

## 2023-04-25 DIAGNOSIS — Z6836 Body mass index (BMI) 36.0-36.9, adult: Secondary | ICD-10-CM | POA: Diagnosis not present

## 2023-04-25 DIAGNOSIS — M62830 Muscle spasm of back: Secondary | ICD-10-CM | POA: Diagnosis not present

## 2023-04-25 DIAGNOSIS — E6609 Other obesity due to excess calories: Secondary | ICD-10-CM | POA: Diagnosis not present

## 2023-04-25 DIAGNOSIS — M9903 Segmental and somatic dysfunction of lumbar region: Secondary | ICD-10-CM | POA: Diagnosis not present

## 2023-04-25 DIAGNOSIS — M9905 Segmental and somatic dysfunction of pelvic region: Secondary | ICD-10-CM | POA: Diagnosis not present

## 2023-04-25 DIAGNOSIS — K219 Gastro-esophageal reflux disease without esophagitis: Secondary | ICD-10-CM | POA: Diagnosis not present

## 2023-05-29 DIAGNOSIS — Z6835 Body mass index (BMI) 35.0-35.9, adult: Secondary | ICD-10-CM | POA: Diagnosis not present

## 2023-05-29 DIAGNOSIS — M5432 Sciatica, left side: Secondary | ICD-10-CM | POA: Diagnosis not present

## 2023-05-29 DIAGNOSIS — I10 Essential (primary) hypertension: Secondary | ICD-10-CM | POA: Diagnosis not present

## 2023-05-29 DIAGNOSIS — E6609 Other obesity due to excess calories: Secondary | ICD-10-CM | POA: Diagnosis not present

## 2023-06-06 ENCOUNTER — Ambulatory Visit: Admitting: Urology

## 2023-06-06 ENCOUNTER — Encounter: Payer: Self-pay | Admitting: Urology

## 2023-06-06 VITALS — BP 110/70 | HR 69

## 2023-06-06 DIAGNOSIS — N401 Enlarged prostate with lower urinary tract symptoms: Secondary | ICD-10-CM | POA: Diagnosis not present

## 2023-06-06 DIAGNOSIS — R351 Nocturia: Secondary | ICD-10-CM | POA: Diagnosis not present

## 2023-06-06 DIAGNOSIS — Z87898 Personal history of other specified conditions: Secondary | ICD-10-CM | POA: Diagnosis not present

## 2023-06-06 DIAGNOSIS — R350 Frequency of micturition: Secondary | ICD-10-CM | POA: Diagnosis not present

## 2023-06-06 LAB — URINALYSIS, ROUTINE W REFLEX MICROSCOPIC
Bilirubin, UA: NEGATIVE
Glucose, UA: NEGATIVE
Ketones, UA: NEGATIVE
Leukocytes,UA: NEGATIVE
Nitrite, UA: NEGATIVE
Protein,UA: NEGATIVE
RBC, UA: NEGATIVE
Specific Gravity, UA: 1.025 (ref 1.005–1.030)
Urobilinogen, Ur: 0.2 mg/dL (ref 0.2–1.0)
pH, UA: 6 (ref 5.0–7.5)

## 2023-06-06 NOTE — Progress Notes (Signed)
 06/06/2023 8:43 AM   Daniel [name] March 14, 1955 [id number]  Referring provider: Kathyleen Parkins, MD [address] [place],  [place] [address]  Continue BPH  HPI: Daniel [name] is a 67yo here for followup for BPh with Nocturia and urinary frequency. IPSS 11 QOl 2 on uroxatral  10mg . He was having urinary frequency due to increased water  intake. He finds if he sips the water  he urinates less frequently. He stopped avodart  due to sexual side effects.    PMH: Past Medical History:  Diagnosis Date   COPD (chronic obstructive pulmonary disease) (HCC)    Hypertension     Surgical History: Past Surgical History:  Procedure Laterality Date   APPENDECTOMY     COLONOSCOPY N/A 04/24/2017   Procedure: COLONOSCOPY;  Surgeon: Alyce Jubilee, MD;  [facility]: [facility];  Service: Endoscopy;  Laterality: N/A;  1:00pm   DG 3RD DIGIT LEFT HAND     ligament torn, surgical repair of middle finger as well.   HEMORRHOID SURGERY     HERNIA REPAIR     RLQ, umbilical    Home Medications:  Allergies as of 06/06/2023   No Known Allergies      Medication List        Accurate as of Jun 06, 2023  8:43 AM. If you have any questions, ask your nurse or doctor.          acetaminophen  500 MG tablet Commonly known as: TYLENOL  Take 1,000 mg by mouth daily as needed for moderate pain or headache.   albuterol  (2.5 MG/3ML) 0.083% nebulizer solution Commonly known as: PROVENTIL  Take 2.5 mg by nebulization every 6 (six) hours as needed for wheezing or shortness of breath.   alfuzosin  10 MG 24 hr tablet Commonly known as: UROXATRAL  Take 1 tablet (10 mg total) by mouth at bedtime.   ALPRAZolam 0.5 MG tablet Commonly known as: XANAX Take 0.5 mg by mouth at bedtime.   amLODipine  2.5 MG tablet Commonly known as: NORVASC  Take 2.5 mg by mouth daily.   cetirizine  10 MG tablet Commonly known as: ZYRTEC  Take 1 tablet (10 mg total) by mouth daily.   dutasteride  0.5 MG capsule Commonly known as:  AVODART  Take 1 capsule (0.5 mg total) by mouth daily.   fluticasone  50 MCG/ACT nasal spray Commonly known as: FLONASE  Place 2 sprays into both nostrils daily.   multivitamin with minerals Tabs tablet Take 1 tablet by mouth daily.   OSTEO BI-FLEX REGULAR STRENGTH PO Take 1 tablet by mouth daily.   oxyCODONE  5 MG immediate release tablet Commonly known as: Oxy IR/ROXICODONE  Take 5 mg by mouth every 4 (four) hours.   Stiolto Respimat 2.5-2.5 MCG/ACT Aers Generic drug: Tiotropium Bromide-Olodaterol SMARTSIG:2 Puff(s) By Mouth Daily   valsartan  160 MG tablet Commonly known as: Diovan  Take 1 tablet (160 mg total) by mouth daily.        Allergies: No Known Allergies  Family History: Family History  Problem Relation Age of Onset   Seizures Mother    Hypertension Mother    COPD Father    Colon cancer Neg Hx    Gastric cancer Neg Hx    Esophageal cancer Neg Hx     Social History:  reports that he quit smoking about 10 years ago. His smoking use included cigarettes. He started smoking about 50 years ago. He has a 12 pack-year smoking history. He has never used smokeless tobacco. He reports that he does not drink alcohol and does not use drugs.  ROS: All other  review of systems were reviewed and are negative except what is noted above in HPI  Physical Exam: BP 110/70   Pulse 69   Constitutional:  Alert and oriented, No acute distress. HEENT: [place] AT, moist mucus membranes.  Trachea midline, no masses. Cardiovascular: No clubbing, cyanosis, or edema. Respiratory: Normal respiratory effort, no increased work of breathing. GI: Abdomen is soft, nontender, nondistended, no abdominal masses GU: No CVA tenderness.  Lymph: No cervical or inguinal lymphadenopathy. Skin: No rashes, bruises or suspicious lesions. Neurologic: Grossly intact, no focal deficits, moving all 4 extremities. Psychiatric: Normal mood and affect.  Laboratory Data: Lab Results  Component Value Date   WBC  7.4 01/27/2018   HGB 15.2 01/27/2018   HCT 48.7 01/27/2018   MCV 98.8 01/27/2018   PLT 226 01/27/2018    Lab Results  Component Value Date   CREATININE 0.90 01/27/2018    No results found for: "PSA"  No results found for: "TESTOSTERONE"  Lab Results  Component Value Date   HGBA1C 5.7 (H) 03/06/2011    Urinalysis    Component Value Date/Time   COLORURINE YELLOW 03/01/2012 1106   APPEARANCEUR Clear 04/04/2023 0927   LABSPEC 1.024 03/01/2012 1106   PHURINE 7.0 03/01/2012 1106   GLUCOSEU Negative 04/04/2023 0927   HGBUR NEGATIVE 03/01/2012 1106   BILIRUBINUR Negative 04/04/2023 0927   KETONESUR NEGATIVE 03/01/2012 1106   PROTEINUR Negative 04/04/2023 0927   PROTEINUR NEGATIVE 03/01/2012 1106   UROBILINOGEN 1.0 03/01/2012 1106   NITRITE Negative 04/04/2023 0927   NITRITE NEGATIVE 03/01/2012 1106   LEUKOCYTESUR 1+ (A) 04/04/2023 0927    Lab Results  Component Value Date   LABMICR See below: 04/04/2023   WBCUA 0-5 04/04/2023   LABEPIT 0-10 04/04/2023   MUCUS Present (A) 04/04/2023   BACTERIA Few (A) 04/04/2023    Pertinent Imaging:  No results found for this or any previous visit.  No results found for this or any previous visit.  No results found for this or any previous visit.  No results found for this or any previous visit.  No results found for this or any previous visit.  No results found for this or any previous visit.  No results found for this or any previous visit.  No results found for this or any previous visit.   Assessment & Plan:    1. Benign prostatic hyperplasia with urinary frequency (Primary) Continue ruoxatral 10mg  qhs - Urinalysis, Routine w reflex microscopic  2. Nocturia Continue uroxatral  10mg  qhs  3. Urinary frequency resolved   No follow-ups on file.  Daniel Nailer, MD  [facility] [place]

## 2023-06-06 NOTE — Patient Instructions (Signed)

## 2023-06-30 DIAGNOSIS — D1730 Benign lipomatous neoplasm of skin and subcutaneous tissue of unspecified sites: Secondary | ICD-10-CM | POA: Diagnosis not present

## 2023-07-17 DIAGNOSIS — D485 Neoplasm of uncertain behavior of skin: Secondary | ICD-10-CM | POA: Diagnosis not present

## 2023-07-17 DIAGNOSIS — D1721 Benign lipomatous neoplasm of skin and subcutaneous tissue of right arm: Secondary | ICD-10-CM | POA: Diagnosis not present

## 2023-07-30 DIAGNOSIS — Z0001 Encounter for general adult medical examination with abnormal findings: Secondary | ICD-10-CM | POA: Diagnosis not present

## 2023-07-30 DIAGNOSIS — J449 Chronic obstructive pulmonary disease, unspecified: Secondary | ICD-10-CM | POA: Diagnosis not present

## 2023-07-30 DIAGNOSIS — G629 Polyneuropathy, unspecified: Secondary | ICD-10-CM | POA: Diagnosis not present

## 2023-07-30 DIAGNOSIS — I10 Essential (primary) hypertension: Secondary | ICD-10-CM | POA: Diagnosis not present

## 2023-07-30 DIAGNOSIS — Z9229 Personal history of other drug therapy: Secondary | ICD-10-CM | POA: Diagnosis not present

## 2023-07-30 DIAGNOSIS — N401 Enlarged prostate with lower urinary tract symptoms: Secondary | ICD-10-CM | POA: Diagnosis not present

## 2023-07-30 DIAGNOSIS — R7303 Prediabetes: Secondary | ICD-10-CM | POA: Diagnosis not present

## 2023-07-30 DIAGNOSIS — Z125 Encounter for screening for malignant neoplasm of prostate: Secondary | ICD-10-CM | POA: Diagnosis not present

## 2023-07-30 DIAGNOSIS — M19042 Primary osteoarthritis, left hand: Secondary | ICD-10-CM | POA: Diagnosis not present

## 2023-07-30 DIAGNOSIS — M720 Palmar fascial fibromatosis [Dupuytren]: Secondary | ICD-10-CM | POA: Diagnosis not present

## 2023-07-30 DIAGNOSIS — M19041 Primary osteoarthritis, right hand: Secondary | ICD-10-CM | POA: Diagnosis not present

## 2023-07-30 DIAGNOSIS — E6609 Other obesity due to excess calories: Secondary | ICD-10-CM | POA: Diagnosis not present

## 2023-07-30 DIAGNOSIS — Z6836 Body mass index (BMI) 36.0-36.9, adult: Secondary | ICD-10-CM | POA: Diagnosis not present

## 2023-07-31 LAB — LAB REPORT - SCANNED
A1c: 6
EGFR: 91
TSH: 1.61 (ref 0.41–5.90)

## 2023-08-22 ENCOUNTER — Encounter: Payer: Self-pay | Admitting: Physician Assistant

## 2023-08-22 ENCOUNTER — Ambulatory Visit (INDEPENDENT_AMBULATORY_CARE_PROVIDER_SITE_OTHER): Admitting: Physician Assistant

## 2023-08-22 VITALS — BP 130/82 | HR 59 | Temp 98.2°F | Ht 74.0 in | Wt 281.2 lb

## 2023-08-22 DIAGNOSIS — K219 Gastro-esophageal reflux disease without esophagitis: Secondary | ICD-10-CM | POA: Diagnosis not present

## 2023-08-22 DIAGNOSIS — J449 Chronic obstructive pulmonary disease, unspecified: Secondary | ICD-10-CM | POA: Diagnosis not present

## 2023-08-22 DIAGNOSIS — I10 Essential (primary) hypertension: Secondary | ICD-10-CM | POA: Diagnosis not present

## 2023-08-22 DIAGNOSIS — R7303 Prediabetes: Secondary | ICD-10-CM | POA: Diagnosis not present

## 2023-08-22 DIAGNOSIS — E781 Pure hyperglyceridemia: Secondary | ICD-10-CM

## 2023-08-22 DIAGNOSIS — Z7689 Persons encountering health services in other specified circumstances: Secondary | ICD-10-CM

## 2023-08-22 NOTE — Assessment & Plan Note (Signed)
 130/82 Controlled. Continue current medications. No change in management. Discussed DASH diet and dietary sodium restrictions.  Increase dietary efforts and physical activity.

## 2023-08-22 NOTE — Assessment & Plan Note (Signed)
 Stable. Follows with Dr. Arla. Continue current treatment plan.

## 2023-08-22 NOTE — Progress Notes (Signed)
 New Patient Office Visit  Subjective    Patient ID: Daniel [name], male    DOB: Jun 25, 1955  Age: 68 y.o. MRN: [id number]  CC: No chief complaint on file.   HPI Daniel [name] presents to establish care  Patient presents today with past medical history significant for hypertension, hyperlipidemia, COPD, GERD, and newly diagnosed prediabetes. He reports recent diet changes to include decreased sugar intake, specifically decreased soda intake. Patient follows regularly with Dr. Arla for management of COPD. Patient states he had lab work done approximately 1 month ago, will send over old medical records. He reports intermittent numbness and tingling in his feet however symptoms are improved with use of compression stockings. He does not need refills of his medications. He reports he is up to date with shingles and pneumonia vaccines. Last colonoscopy in 2019.   Outpatient Encounter Medications as of 08/22/2023  Medication Sig   acetaminophen  (TYLENOL ) 500 MG tablet Take 1,000 mg by mouth daily as needed for moderate pain or headache.   albuterol  (PROVENTIL ) (2.5 MG/3ML) 0.083% nebulizer solution Take 2.5 mg by nebulization every 6 (six) hours as needed for wheezing or shortness of breath.   alfuzosin  (UROXATRAL ) 10 MG 24 hr tablet Take 1 tablet (10 mg total) by mouth at bedtime.   ALPRAZolam (XANAX) 0.5 MG tablet Take 0.5 mg by mouth at bedtime.   amLODipine  (NORVASC ) 2.5 MG tablet Take 2.5 mg by mouth daily.   cetirizine  (ZYRTEC ) 10 MG tablet Take 1 tablet (10 mg total) by mouth daily.   dutasteride  (AVODART ) 0.5 MG capsule Take 1 capsule (0.5 mg total) by mouth daily.   fluticasone  (FLONASE ) 50 MCG/ACT nasal spray Place 2 sprays into both nostrils daily.   Glucosamine-Chondroitin (OSTEO BI-FLEX REGULAR STRENGTH PO) Take 1 tablet by mouth daily.   Multiple Vitamin (MULITIVITAMIN WITH MINERALS) TABS Take 1 tablet by mouth daily.   oxyCODONE  (OXY IR/ROXICODONE ) 5 MG immediate release tablet Take 5  mg by mouth every 4 (four) hours.   STIOLTO RESPIMAT 2.5-2.5 MCG/ACT AERS SMARTSIG:2 Puff(s) By Mouth Daily   valsartan  (DIOVAN ) 160 MG tablet Take 1 tablet (160 mg total) by mouth daily.   No facility-administered encounter medications on file as of 08/22/2023.    Past Medical History:  Diagnosis Date   COPD (chronic obstructive pulmonary disease) (HCC)    Hypertension     Past Surgical History:  Procedure Laterality Date   APPENDECTOMY     COLONOSCOPY N/A 04/24/2017   Procedure: COLONOSCOPY;  Surgeon: Harvey Margo CROME, MD;  Location: [facility];  Service: Endoscopy;  Laterality: N/A;  1:00pm   DG 3RD DIGIT LEFT HAND     ligament torn, surgical repair of middle finger as well.   HEMORRHOID SURGERY     HERNIA REPAIR     RLQ, umbilical    Family History  Problem Relation Age of Onset   Seizures Mother    Hypertension Mother    COPD Father    Colon cancer Neg Hx    Gastric cancer Neg Hx    Esophageal cancer Neg Hx     Social History   Socioeconomic History   Marital status: Married    Spouse name: Not on file   Number of children: Not on file   Years of education: Not on file   Highest education level: Not on file  Occupational History   Not on file  Tobacco Use   Smoking status: Former    Current packs/day: 0.00    Average  packs/day: 0.3 packs/day for 40.0 years (12.0 ttl pk-yrs)    Types: Cigarettes    Start date: 01/28/1973    Quit date: 01/28/2013    Years since quitting: 10.5   Smokeless tobacco: Never  Substance and Sexual Activity   Alcohol use: No   Drug use: No   Sexual activity: Yes  Other Topics Concern   Not on file  Social History Narrative   Not on file   Social Drivers of Health   Financial Resource Strain: Not on file  Food Insecurity: Not on file  Transportation Needs: Not on file  Physical Activity: Not on file  Stress: Not on file  Social Connections: Not on file  Intimate Partner Violence: Not on file    ROS      Objective     BP 130/82   Pulse (!) 59   Temp 98.2 F (36.8 C)   Ht 6' 2 (1.88 m)   Wt 281 lb 3.2 oz (127.6 kg)   SpO2 96%   BMI 36.10 kg/m   Physical Exam Constitutional:      Appearance: Normal appearance. He is obese.  HENT:     Head: Normocephalic.     Mouth/Throat:     Mouth: Mucous membranes are moist.     Pharynx: Oropharynx is clear.  Eyes:     Extraocular Movements: Extraocular movements intact.     Conjunctiva/sclera: Conjunctivae normal.  Cardiovascular:     Rate and Rhythm: Normal rate and regular rhythm.     Pulses:          Dorsalis pedis pulses are 2+ on the right side and 2+ on the left side.       Posterior tibial pulses are 2+ on the right side and 2+ on the left side.     Heart sounds: No murmur heard. Pulmonary:     Effort: Pulmonary effort is normal.     Breath sounds: No wheezing or rales.  Musculoskeletal:     Right lower leg: No edema.     Left lower leg: No edema.     Right foot: Normal range of motion. No deformity.     Left foot: Normal range of motion. No deformity.  Feet:     Right foot:     Skin integrity: Skin integrity normal.     Left foot:     Skin integrity: Skin integrity normal.  Skin:    General: Skin is warm and dry.  Neurological:     General: No focal deficit present.     Mental Status: He is alert and oriented to person, place, and time.  Psychiatric:        Mood and Affect: Mood normal.        Behavior: Behavior normal.       Assessment & Plan:  Encounter to establish care  Essential hypertension, benign Assessment & Plan: 130/82 Controlled. Continue current medications. No change in management. Discussed DASH diet and dietary sodium restrictions.  Increase dietary efforts and physical activity.   Orders: -     CMP14+EGFR -     CBC with Differential/Platelet  COPD GOLD 3 Assessment & Plan: Stable. Follows with Dr. Arla. Continue current treatment plan.    Chronic GERD Assessment & Plan: Stable. Continue with  pantoprazole.    Pre-diabetes Assessment & Plan: Stable. Continue with current management without changes. Discussed healthy diet and lifestyle. Lab work ordered to be done around [facility] October.   Orders: -  Hemoglobin A1c -     Lipid panel  Hypertriglyceridemia without hypercholesterolemia -     Lipid panel    Return in about 6 months (around 02/22/2024).   [name] Terriyah Westra, PA-C

## 2023-08-22 NOTE — Assessment & Plan Note (Signed)
 Stable. Continue with pantoprazole.

## 2023-08-22 NOTE — Assessment & Plan Note (Signed)
 Stable. Continue with current management without changes. Discussed healthy diet and lifestyle. Lab work ordered to be done around [facility] October.

## 2023-09-01 ENCOUNTER — Ambulatory Visit (INDEPENDENT_AMBULATORY_CARE_PROVIDER_SITE_OTHER): Admitting: Physician Assistant

## 2023-09-01 ENCOUNTER — Encounter: Payer: Self-pay | Admitting: Physician Assistant

## 2023-09-01 ENCOUNTER — Telehealth: Payer: Self-pay

## 2023-09-01 VITALS — BP 136/83 | HR 65 | Temp 98.2°F | Ht 74.0 in | Wt 280.0 lb

## 2023-09-01 DIAGNOSIS — G5793 Unspecified mononeuropathy of bilateral lower limbs: Secondary | ICD-10-CM | POA: Insufficient documentation

## 2023-09-01 DIAGNOSIS — K219 Gastro-esophageal reflux disease without esophagitis: Secondary | ICD-10-CM

## 2023-09-01 MED ORDER — IBUPROFEN 800 MG PO TABS: 800.0000 mg | ORAL_TABLET | Freq: Three times a day (TID) | ORAL | 0 refills | Status: DC | PRN

## 2023-09-01 MED ORDER — OMEPRAZOLE 40 MG PO CPDR: 40.0000 mg | DELAYED_RELEASE_CAPSULE | Freq: Every day | ORAL | 3 refills | Status: DC

## 2023-09-01 MED ORDER — GABAPENTIN 300 MG PO CAPS: 300.0000 mg | ORAL_CAPSULE | Freq: Every day | ORAL | 1 refills | Status: DC

## 2023-09-01 NOTE — Progress Notes (Signed)
 Acute Office Visit  Subjective:     Patient ID: Daniel [name], male    DOB: [birth year], 68 y.o.   MRN: [id number]   Patient presents today with request for referral to GI. States he has been coughing more and experiencing a choking sensation with eating. Relates his wife just had her esophagus stretched by Dr. Shaaron and believes he is needing his esophagus stretched as well. Requesting referral to GI. Currently on omeprazole  for chronic GERD. Additionally, patient reports tingling of the bottom of his feet. States symptoms have been present for 2-3 months. Better with compression stockings, worse at night. Denies pain or ulcers. No skin changes. Patient is prediabetic.      Review of Systems  Constitutional:  Negative for fever, malaise/fatigue and weight loss.  Respiratory:  Positive for cough. Negative for shortness of breath.   Cardiovascular:  Negative for chest pain and palpitations.  Gastrointestinal:  Negative for heartburn and vomiting.  Musculoskeletal:  Negative for falls.  Neurological:  Positive for tingling. Negative for tremors, sensory change and weakness.        Objective:     BP 136/83   Pulse 65   Temp 98.2 F (36.8 C)   Ht 6' 2 (1.88 m)   Wt 280 lb (127 kg)   SpO2 96%   BMI 35.95 kg/m   Physical Exam Constitutional:      Appearance: Normal appearance. He is obese.  HENT:     Head: Normocephalic and atraumatic.     Mouth/Throat:     Mouth: Mucous membranes are moist.     Pharynx: Oropharynx is clear.  Eyes:     Extraocular Movements: Extraocular movements intact.     Conjunctiva/sclera: Conjunctivae normal.  Cardiovascular:     Rate and Rhythm: Normal rate and regular rhythm.     Heart sounds: Normal heart sounds. No murmur heard. Pulmonary:     Effort: Pulmonary effort is normal.     Breath sounds: No wheezing, rhonchi or rales.  Musculoskeletal:     Right lower leg: No edema.     Left lower leg: No edema.     Right foot: Normal range of  motion and normal capillary refill. No deformity. Normal pulse.     Left foot: Normal range of motion and normal capillary refill. No deformity.  Feet:     Right foot:     Skin integrity: No ulcer, blister, skin breakdown, erythema or warmth.     Left foot:     Skin integrity: No ulcer, blister, skin breakdown, erythema or warmth.  Skin:    General: Skin is warm and dry.  Neurological:     General: No focal deficit present.     Mental Status: He is alert and oriented to person, place, and time.     Sensory: Sensation is intact.     Motor: Motor function is intact.     Gait: Gait is intact.  Psychiatric:        Mood and Affect: Mood normal.        Behavior: Behavior normal.     No results found for any visits on 09/01/23.      Assessment & Plan:  Chronic GERD Assessment & Plan: Patient reports coughing and dysphagia with meals. Currently on omeprazole , confirmed with chart review. Continue omeprazole , advised medication on empty stomach. Placing referral to GI for possible EGD. Patient appears well today, no reg flags. Follow up in October at previously scheduled appointment.   Orders: -  Omeprazole ; Take 1 capsule (40 mg total) by mouth daily.  Dispense: 30 capsule; Refill: 3 -     Ambulatory referral to Gastroenterology  Neuropathy of both feet Assessment & Plan: Patient presents today with bilateral tingling of soles of feet. Better with compression stockings, worse at night. Reassuring foot exam at our last visit. Pulses intact. Lower extremity skin is warm, no signs of vascular insufficiency. Discussed medications. Starting gabapentin  at bedtime for symptoms. Patient to follow up in October at previously scheduled appointment.    Orders: -     Gabapentin ; Take 1 capsule (300 mg total) by mouth at bedtime.  Dispense: 30 capsule; Refill: 1  Other orders -     Ibuprofen ; Take 1 tablet (800 mg total) by mouth every 8 (eight) hours as needed.  Dispense: 30 tablet; Refill:  0    Return for previously scheduled appointment.  [name] Kobie Matkins, PA-C

## 2023-09-01 NOTE — Telephone Encounter (Signed)
 Pt need advice on what diet he needs to do for pre diabetes

## 2023-09-01 NOTE — Assessment & Plan Note (Signed)
 Patient presents today with bilateral tingling of soles of feet. Better with compression stockings, worse at night. Reassuring foot exam at our last visit. Pulses intact. Lower extremity skin is warm, no signs of vascular insufficiency. Discussed medications. Starting gabapentin  at bedtime for symptoms. Patient to follow up in October at previously scheduled appointment.

## 2023-09-01 NOTE — Assessment & Plan Note (Signed)
 Patient reports coughing and dysphagia with meals. Currently on omeprazole , confirmed with chart review. Continue omeprazole , advised medication on empty stomach. Placing referral to GI for possible EGD. Patient appears well today, no reg flags. Follow up in October at previously scheduled appointment.

## 2023-09-19 ENCOUNTER — Telehealth: Payer: Self-pay

## 2023-09-19 NOTE — Telephone Encounter (Unsigned)
 Copied from CRM #[id number]. Topic: Referral - Status >> Sep 19, 2023  2:22 PM Teressa P wrote: Reason for CRM: Patient is calling asking about his referral to a GI doctor.  [phone]

## 2023-09-19 NOTE — Telephone Encounter (Signed)
 Message sent to referral team for GI referral status

## 2023-09-30 ENCOUNTER — Ambulatory Visit (INDEPENDENT_AMBULATORY_CARE_PROVIDER_SITE_OTHER): Admitting: Physician Assistant

## 2023-09-30 VITALS — BP 138/91 | HR 72 | Temp 97.2°F | Ht 74.0 in | Wt 278.0 lb

## 2023-09-30 DIAGNOSIS — J449 Chronic obstructive pulmonary disease, unspecified: Secondary | ICD-10-CM | POA: Diagnosis not present

## 2023-09-30 DIAGNOSIS — H6993 Unspecified Eustachian tube disorder, bilateral: Secondary | ICD-10-CM

## 2023-09-30 MED ORDER — FLUTICASONE PROPIONATE 50 MCG/ACT NA SUSP: 2.0000 | Freq: Every day | NASAL | 0 refills | Status: AC

## 2023-09-30 NOTE — Progress Notes (Unsigned)
 Acute Office Visit  Subjective:     Patient ID: Daniel [name], male    DOB: 25-Aug-1955, 68 y.o.   MRN: [id number]  Discussed the use of AI scribe software for clinical note transcription with the patient, who gave verbal consent to proceed.  History of Present Illness Daniel [name] is a 68 year old male with COPD who presents with ear congestion and throat drainage.  His ears feel congested with drainage down his throat. The drainage is white and occurs mainly in the morning, requiring frequent nose blowing. Congestion feels like it is beneath the ear canal, causing discomfort. Zyrtec  and Flonase  have been used for a week, improving drainage but not fully resolving ear congestion.  He experiences wheezing due to COPD and uses Stiolto every morning for shortness of breath and wheezing. There is no new or worsening shortness of breath, chest pain, or fever.    Review of Systems  Constitutional:  Negative for chills, fever and malaise/fatigue.  HENT:  Negative for congestion, ear discharge, ear pain and sore throat.   Respiratory:  Positive for cough, sputum production and wheezing. Negative for shortness of breath.   Cardiovascular:  Negative for chest pain.  Musculoskeletal:  Negative for myalgias.  Neurological:  Negative for headaches.        Objective:     BP (!) 138/91   Pulse 72   Temp (!) 97.2 F (36.2 C)   Ht 6' 2 (1.88 m)   Wt 278 lb (126.1 kg)   SpO2 96%   BMI 35.69 kg/m   Physical Exam Vitals reviewed.  Constitutional:      General: He is not in acute distress.    Appearance: Normal appearance. He is obese. He is not ill-appearing.  HENT:     Right Ear: Tympanic membrane normal.     Left Ear: Tympanic membrane normal.     Nose: Nose normal.     Mouth/Throat:     Mouth: Mucous membranes are moist.     Pharynx: Oropharynx is clear.  Eyes:     Extraocular Movements: Extraocular movements intact.     Conjunctiva/sclera: Conjunctivae normal.   Cardiovascular:     Rate and Rhythm: Normal rate and regular rhythm.     Heart sounds: Normal heart sounds. No murmur heard. Pulmonary:     Effort: Pulmonary effort is normal.     Breath sounds: Normal breath sounds. No wheezing, rhonchi or rales.  Lymphadenopathy:     Cervical: No cervical adenopathy.  Skin:    General: Skin is warm and dry.  Neurological:     General: No focal deficit present.     Mental Status: He is alert and oriented to person, place, and time.  Psychiatric:        Mood and Affect: Mood normal.        Behavior: Behavior normal.     No results found for any visits on 09/30/23.      Assessment & Plan:  Dysfunction of both eustachian tubes Assessment & Plan: Eustachian tube dysfunction with chronic rhinitis and upper respiratory symptoms. Ear fullness, throat drainage, and mucus production improved with Zyrtec  and Flonase . Lungs clear to auscultation bilaterally, TMs are non erythematous and non bulging, no ear discharge. Likely self-resolving viral infection. Supportive care reviewed with patient. Discussed with patient that there are no indications for antibiotics at this time, and viral respiratory illness can be persistent in duration. - Continue Flonase  twice daily. - Recommend over-the-counter Mucinex bid for mucus  production. - Continue Zyrtec  in the morning. - Follow up for new or worsening symptoms such as fever, shortness of breath, chest pain, ear pain or drainage.     Orders: -     Fluticasone  Propionate; Place 2 sprays into both nostrils daily.  Dispense: 16 g; Refill: 0  COPD GOLD 3 Assessment & Plan: Wheezing and shortness of breath managed with Stiolto. No new or worsening symptoms. - Continue current use of inhaler as needed for shortness of breath and wheezing. - Warning signs reviewed, such as new or worsening shortness of breath, wheezing, or difficulty breathing.     Return if symptoms worsen or do not improve, nurse visit for flu  and pneumonia vaccinations.  Daniel Jahlen Bollman, PA-C

## 2023-10-01 ENCOUNTER — Encounter: Payer: Self-pay | Admitting: Physician Assistant

## 2023-10-01 DIAGNOSIS — H6993 Unspecified Eustachian tube disorder, bilateral: Secondary | ICD-10-CM | POA: Insufficient documentation

## 2023-10-01 NOTE — Assessment & Plan Note (Signed)
 Eustachian tube dysfunction with chronic rhinitis and upper respiratory symptoms. Ear fullness, throat drainage, and mucus production improved with Zyrtec  and Flonase . Lungs clear to auscultation bilaterally, TMs are non erythematous and non bulging, no ear discharge. Likely self-resolving viral infection. Supportive care reviewed with patient. Discussed with patient that there are no indications for antibiotics at this time, and viral respiratory illness can be persistent in duration. - Continue Flonase  twice daily. - Recommend over-the-counter Mucinex bid for mucus production. - Continue Zyrtec  in the morning. - Follow up for new or worsening symptoms such as fever, shortness of breath, chest pain, ear pain or drainage.

## 2023-10-01 NOTE — Assessment & Plan Note (Signed)
 Wheezing and shortness of breath managed with Stiolto. No new or worsening symptoms. - Continue current use of inhaler as needed for shortness of breath and wheezing. - Warning signs reviewed, such as new or worsening shortness of breath, wheezing, or difficulty breathing.

## 2023-10-06 ENCOUNTER — Encounter: Payer: Self-pay | Admitting: Gastroenterology

## 2023-10-08 ENCOUNTER — Ambulatory Visit: Admitting: Urology

## 2023-10-08 ENCOUNTER — Encounter: Payer: Self-pay | Admitting: Urology

## 2023-10-08 VITALS — BP 107/59 | HR 63

## 2023-10-08 DIAGNOSIS — N401 Enlarged prostate with lower urinary tract symptoms: Secondary | ICD-10-CM | POA: Diagnosis not present

## 2023-10-08 DIAGNOSIS — R351 Nocturia: Secondary | ICD-10-CM | POA: Diagnosis not present

## 2023-10-08 DIAGNOSIS — R350 Frequency of micturition: Secondary | ICD-10-CM | POA: Diagnosis not present

## 2023-10-08 LAB — URINALYSIS, ROUTINE W REFLEX MICROSCOPIC
Bilirubin, UA: NEGATIVE
Glucose, UA: NEGATIVE
Ketones, UA: NEGATIVE
Leukocytes,UA: NEGATIVE
Nitrite, UA: NEGATIVE
Protein,UA: NEGATIVE
RBC, UA: NEGATIVE
Specific Gravity, UA: 1.02 (ref 1.005–1.030)
Urobilinogen, Ur: 1 mg/dL (ref 0.2–1.0)
pH, UA: 6 (ref 5.0–7.5)

## 2023-10-08 MED ORDER — ALFUZOSIN HCL ER 10 MG PO TB24
10.0000 mg | ORAL_TABLET | Freq: Every day | ORAL | 11 refills | Status: AC
Start: 2023-10-08 — End: ?

## 2023-10-08 NOTE — Patient Instructions (Signed)

## 2023-10-08 NOTE — Progress Notes (Signed)
 10/08/2023 8:59 AM   Daniel [name] August 12, 1955 [id number]  Referring provider: Bertell Satterfield, MD [address] [place],  [place] [address]  Followup nocturia   HPI: Daniel [name] is a 68yo here for followup for BPh with nocturia. IPSS 9 QOl 1 on uroxatral  10mg  at bedtime. Noctuia 1-3x depedning on fluid consumption. Erections are improved since stopping avodart . Urine stream strong. No strainignt o urinate.    PMH: Past Medical History:  Diagnosis Date   COPD (chronic obstructive pulmonary disease) (HCC)    Hypertension     Surgical History: Past Surgical History:  Procedure Laterality Date   APPENDECTOMY     COLONOSCOPY N/A 04/24/2017   Procedure: COLONOSCOPY;  Surgeon: Harvey Margo CROME, MD;  Location: [facility];  Service: Endoscopy;  Laterality: N/A;  1:00pm   DG 3RD DIGIT LEFT HAND     ligament torn, surgical repair of middle finger as well.   HEMORRHOID SURGERY     HERNIA REPAIR     RLQ, umbilical    Home Medications:  Allergies as of 10/08/2023   No Known Allergies      Medication List        Accurate as of October 08, 2023  8:59 AM. If you have any questions, ask your nurse or doctor.          acetaminophen  500 MG tablet Commonly known as: TYLENOL  Take 1,000 mg by mouth daily as needed for moderate pain or headache.   albuterol  (2.5 MG/3ML) 0.083% nebulizer solution Commonly known as: PROVENTIL  Take 2.5 mg by nebulization every 6 (six) hours as needed for wheezing or shortness of breath.   alfuzosin  10 MG 24 hr tablet Commonly known as: UROXATRAL  Take 1 tablet (10 mg total) by mouth at bedtime.   ALPRAZolam 0.5 MG tablet Commonly known as: XANAX Take 0.5 mg by mouth at bedtime.   amLODipine  2.5 MG tablet Commonly known as: NORVASC  Take 2.5 mg by mouth daily.   cetirizine  10 MG tablet Commonly known as: ZYRTEC  Take 1 tablet (10 mg total) by mouth daily.   dutasteride  0.5 MG capsule Commonly known as: AVODART  Take 1 capsule  (0.5 mg total) by mouth daily.   fluticasone  50 MCG/ACT nasal spray Commonly known as: FLONASE  Place 2 sprays into both nostrils daily.   gabapentin  300 MG capsule Commonly known as: NEURONTIN  Take 1 capsule (300 mg total) by mouth at bedtime.   ibuprofen  800 MG tablet Commonly known as: IBU Take 1 tablet (800 mg total) by mouth every 8 (eight) hours as needed.   multivitamin with minerals Tabs tablet Take 1 tablet by mouth daily.   omeprazole  40 MG capsule Commonly known as: PRILOSEC Take 1 capsule (40 mg total) by mouth daily.   OSTEO BI-FLEX REGULAR STRENGTH PO Take 1 tablet by mouth daily.   oxyCODONE  5 MG immediate release tablet Commonly known as: Oxy IR/ROXICODONE  Take 5 mg by mouth every 4 (four) hours.   Stiolto Respimat 2.5-2.5 MCG/ACT Aers Generic drug: Tiotropium Bromide-Olodaterol SMARTSIG:2 Puff(s) By Mouth Daily   valsartan  160 MG tablet Commonly known as: Diovan  Take 1 tablet (160 mg total) by mouth daily.        Allergies: No Known Allergies  Family History: Family History  Problem Relation Age of Onset   Seizures Mother    Hypertension Mother    COPD Father    Colon cancer Neg Hx    Gastric cancer Neg Hx    Esophageal cancer Neg Hx  Social History:  reports that he quit smoking about 10 years ago. His smoking use included cigarettes. He started smoking about 50 years ago. He has a 12 pack-year smoking history. He has never used smokeless tobacco. He reports that he does not drink alcohol and does not use drugs.  ROS: All other review of systems were reviewed and are negative except what is noted above in HPI  Physical Exam: BP (!) 107/59   Pulse 63   Constitutional:  Alert and oriented, No acute distress. HEENT: [place] AT, moist mucus membranes.  Trachea midline, no masses. Cardiovascular: No clubbing, cyanosis, or edema. Respiratory: Normal respiratory effort, no increased work of breathing. GI: Abdomen is soft, nontender,  nondistended, no abdominal masses GU: No CVA tenderness.  Lymph: No cervical or inguinal lymphadenopathy. Skin: No rashes, bruises or suspicious lesions. Neurologic: Grossly intact, no focal deficits, moving all 4 extremities. Psychiatric: Normal mood and affect.  Laboratory Data: Lab Results  Component Value Date   WBC 7.4 01/27/2018   HGB 15.2 01/27/2018   HCT 48.7 01/27/2018   MCV 98.8 01/27/2018   PLT 226 01/27/2018    Lab Results  Component Value Date   CREATININE 0.90 01/27/2018    No results found for: PSA  No results found for: TESTOSTERONE  Lab Results  Component Value Date   HGBA1C 5.7 (H) 03/06/2011    Urinalysis    Component Value Date/Time   COLORURINE YELLOW 03/01/2012 1106   APPEARANCEUR Clear 06/06/2023 0827   LABSPEC 1.024 03/01/2012 1106   PHURINE 7.0 03/01/2012 1106   GLUCOSEU Negative 06/06/2023 0827   HGBUR NEGATIVE 03/01/2012 1106   BILIRUBINUR Negative 06/06/2023 0827   KETONESUR NEGATIVE 03/01/2012 1106   PROTEINUR Negative 06/06/2023 0827   PROTEINUR NEGATIVE 03/01/2012 1106   UROBILINOGEN 1.0 03/01/2012 1106   NITRITE Negative 06/06/2023 0827   NITRITE NEGATIVE 03/01/2012 1106   LEUKOCYTESUR Negative 06/06/2023 0827    Lab Results  Component Value Date   LABMICR Comment 06/06/2023   WBCUA 0-5 04/04/2023   LABEPIT 0-10 04/04/2023   MUCUS Present (A) 04/04/2023   BACTERIA Few (A) 04/04/2023    Pertinent Imaging:  No results found for this or any previous visit.  No results found for this or any previous visit.  No results found for this or any previous visit.  No results found for this or any previous visit.  No results found for this or any previous visit.  No results found for this or any previous visit.  No results found for this or any previous visit.  No results found for this or any previous visit.   Assessment & Plan:    1. Benign prostatic hyperplasia with urinary frequency (Primary) -continue  uroxatral  10mg  qhs - Urinalysis, Routine w reflex microscopic  2. Nocturia Continue uroxatral  10mg  qhs   No follow-ups on file.  Belvie Clara, MD  [facility] [place]

## 2023-10-14 ENCOUNTER — Ambulatory Visit (INDEPENDENT_AMBULATORY_CARE_PROVIDER_SITE_OTHER)

## 2023-10-14 DIAGNOSIS — Z23 Encounter for immunization: Secondary | ICD-10-CM

## 2023-10-21 ENCOUNTER — Telehealth: Payer: Self-pay | Admitting: Physician Assistant

## 2023-10-21 ENCOUNTER — Other Ambulatory Visit: Payer: Self-pay | Admitting: Physician Assistant

## 2023-10-21 DIAGNOSIS — G5793 Unspecified mononeuropathy of bilateral lower limbs: Secondary | ICD-10-CM

## 2023-10-21 MED ORDER — GABAPENTIN 300 MG PO CAPS: 300.0000 mg | ORAL_CAPSULE | Freq: Every day | ORAL | 1 refills | Status: DC

## 2023-10-21 NOTE — Telephone Encounter (Signed)
 Refill on  gabapentin  (NEURONTIN ) 300 MG capsule   [facility] pharmacy

## 2023-10-23 DIAGNOSIS — K080 Exfoliation of teeth due to systemic causes: Secondary | ICD-10-CM | POA: Diagnosis not present

## 2023-10-30 ENCOUNTER — Other Ambulatory Visit: Payer: Self-pay

## 2023-10-30 ENCOUNTER — Telehealth: Payer: Self-pay | Admitting: Physician Assistant

## 2023-10-30 NOTE — Telephone Encounter (Signed)
 Refill on  ibuprofen  (IBU) 800 MG tablet   [facility]

## 2023-10-31 ENCOUNTER — Telehealth: Payer: Self-pay | Admitting: *Deleted

## 2023-10-31 ENCOUNTER — Other Ambulatory Visit: Payer: Self-pay | Admitting: Physician Assistant

## 2023-10-31 MED ORDER — IBUPROFEN 800 MG PO TABS: 800.0000 mg | ORAL_TABLET | Freq: Three times a day (TID) | ORAL | 1 refills | Status: AC | PRN

## 2023-10-31 MED ORDER — VALSARTAN 160 MG PO TABS: 160.0000 mg | ORAL_TABLET | Freq: Every day | ORAL | 1 refills | Status: DC

## 2023-10-31 NOTE — Telephone Encounter (Signed)
 [name], Daniel, [place]      10/31/23  4:45 PM Medication sent.

## 2023-10-31 NOTE — Telephone Encounter (Unsigned)
 Copied from CRM #[id number]. Topic: Clinical - Medication Refill >> Oct 31, 2023 10:23 AM Wess RAMAN wrote: Medication: valsartan  (DIOVAN ) 160 MG tablet   Has the patient contacted their pharmacy? Yes (Agent: If no, request that the patient contact the pharmacy for the refill. If patient does not wish to contact the pharmacy document the reason why and proceed with request.) (Agent: If yes, when and what did the pharmacy advise?) Pharmacy already reached out and has not gotten a response  This is the patient's preferred pharmacy:  [facility] [place], [place] - [id number] [place] [address] Phone: [phone] Fax: [phone]  Is this the correct pharmacy for this prescription? Yes If no, delete pharmacy and type the correct one.   Has the prescription been filled recently? Yes  Is the patient out of the medication? No  Has the patient been seen for an appointment in the last year OR does the patient have an upcoming appointment? Yes  Can we respond through MyChart? No. Would prefer a call or text  Agent: Please be advised that Rx refills may take up to 3 business days. We ask that you follow-up with your pharmacy.

## 2023-10-31 NOTE — Telephone Encounter (Signed)
 Copied from CRM [id number]. Topic: Clinical - Prescription Issue >> Oct 31, 2023  3:45 PM Delon DASEN wrote: Reason for CRM: valsartan  (DIOVAN ) 160 MG tablet- Patient is wanting [name] to take over this medication- request was submitted- would like a message

## 2023-11-03 ENCOUNTER — Telehealth: Payer: Self-pay

## 2023-11-03 ENCOUNTER — Ambulatory Visit: Admitting: Internal Medicine

## 2023-11-03 ENCOUNTER — Encounter: Payer: Self-pay | Admitting: Internal Medicine

## 2023-11-03 VITALS — BP 112/74 | HR 62 | Ht 74.0 in | Wt 274.8 lb

## 2023-11-03 DIAGNOSIS — Z122 Encounter for screening for malignant neoplasm of respiratory organs: Secondary | ICD-10-CM

## 2023-11-03 DIAGNOSIS — Z87891 Personal history of nicotine dependence: Secondary | ICD-10-CM

## 2023-11-03 DIAGNOSIS — K219 Gastro-esophageal reflux disease without esophagitis: Secondary | ICD-10-CM

## 2023-11-03 DIAGNOSIS — I10 Essential (primary) hypertension: Secondary | ICD-10-CM | POA: Diagnosis not present

## 2023-11-03 DIAGNOSIS — J449 Chronic obstructive pulmonary disease, unspecified: Secondary | ICD-10-CM | POA: Diagnosis not present

## 2023-11-03 MED ORDER — STIOLTO RESPIMAT 2.5-2.5 MCG/ACT IN AERS: INHALATION_SPRAY | RESPIRATORY_TRACT | 11 refills | Status: AC

## 2023-11-03 MED ORDER — VALSARTAN 160 MG PO TABS: 160.0000 mg | ORAL_TABLET | Freq: Every day | ORAL | 11 refills | Status: DC

## 2023-11-03 NOTE — Telephone Encounter (Signed)
 Lung Cancer Screening Narrative/Criteria Questionnaire (Cigarette Smokers Only- No Cigars/Pipes/vapes)   Daniel [name]   SDMV:11/12/2023 10:30 am [name]      July 27, 1955   LDCT: 11/12/2023 4:30 pm [facility]    68 y.o.   Phone: [phone]   Lung Screening Narrative (confirm age 4-77 yrs Medicare / 50-80 yrs Private pay insurance)   Insurance information: Medicare   Referring Provider: Darlean   This screening involves an initial phone call with a team member from our program. It is called a shared decision making visit. The initial meeting is required by  insurance and Medicare to make sure you understand the program. This appointment takes about 15-20 minutes to complete. You will complete the screening scan at your scheduled date/time.  This scan takes about 5-10 minutes to complete. You can eat and drink normally before and after the scan.  Criteria questions for Lung Cancer Screening:   Are you a current or former smoker? Former Age began smoking: 17   If you are a former smoker, what year did you quit smoking? Quit 2015 (within 15 yrs)   To calculate your smoking history, I need an accurate estimate of how many packs of cigarettes you smoked per day and for how many years. (Not just the number of PPD you are now smoking)   Years smoking 39 x Packs per day 1 = Pack years 39   (at least 20 pack yrs)   (Make sure they understand that we need to know how much they have smoked in the past, not just the number of PPD they are smoking now)  Do you have a personal history of cancer?  No    Do you have a family history of cancer? No  Are you coughing up blood?  No  Have you had unexplained weight loss of 15 lbs or more in the last 6 months? No  It looks like you meet all criteria.  When would be a good time for us  to schedule you for this screening?   Additional information: N/A

## 2023-11-03 NOTE — Assessment & Plan Note (Addendum)
 Rx as of 10/29/2022 = otc's prn with f/u by PCP/ gi prn   Reviewed bed blocks/diet/ lifestyle and f/u with GI planned

## 2023-11-03 NOTE — Assessment & Plan Note (Addendum)
 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

## 2023-11-03 NOTE — Assessment & Plan Note (Addendum)
 Quit smoking x 2015 so eligible for LDSCT until 2030> referred to LCS program 10/29/2022 and again 11/03/2023  >>>  See AVS

## 2023-11-03 NOTE — Progress Notes (Signed)
 Daniel [name], male    DOB: 04/01/1955    MRN: [id number]   Brief patient profile:  34   yowm  quit smoking 2015  with some cough/sob @ wt around 227  that transiently  improved but worse since ? 2015 referred to pulmonary clinic in [facility]  08/22/2022 by [facility]  [facility]  s/p ? Aecopd  08/02/22   [facility]  08/02/22 rx for aecopd some better   History of Present Illness  08/22/2022  Pulmonary/ 1st office eval/ [facility] [place] / [place] [facility] on  stiolto/albuterol  po/ iv/neb  Chief Complaint  Patient presents with   Shortness of Breath   Establish Care  Dyspnea:  ok on flat on surface in a/c Cough: bad to worse for 6 m dry / hacking  all daytime  Sleep: flat bed  / 2 pillows / on side - can't breathe on back (immediated smothering s choking)  otherwise  SABA use: 3-4 x days/ no neb 02: none  Overt hb  Rec D/C  vasotec  and take one half of the new pill = valsartan  in place of vasotec  tonight  Valsartan  160 mg one daily and ok add or subtract a half if Blood pressure not ideal  Try off stiolto respimat and albuterol  pills after  08/25/22 doses  Only use your albuterol  as a rescue medication Also  Ok to try albuterol   x 2 puffs x 15 min before an activity (on alternating days)  that you know would usually make you short of breath    For cough > mucinex DM 1200 mg every 12 hours as needed   Schedule PFTs next available    09/17/2022  f/u ov/[place] office/Nadeem Romanoski re: ? Acei case / GOLD 3 copd  maint on stiolto  needs eos and alpha one screening  Chief Complaint  Patient presents with   Asthmatic bronchitis with component of UACS   Dyspnea:  MMRC1 = can walk nl pace, flat grade, can't hurry or go uphills or steps s sob  / mailbox 50 and flat  Cough: 24 h acutely worse with green mucus s nasal symptoms  Sleeping: on side bed level one pillow  no  resp cc  SABA use: none 02: none needed  Rec Zpak  For cough/ congestion > mucinex dm  up to maximum of  1200 mg every 12 hours as needed  If not all better Try  prilosec otc 20mg   Take 30-60 min before first meal of the day and Pepcid ac (famotidine) 20 mg one after supper bedtime until cough /throat congestion are completely gone for at least a week without the need for cough suppression  Continue stiolto 2 puffs each am for now    10/29/2022  f/u ov/Kinsly Hild re: GOLD 3 copd maint on stiolto   Chief Complaint  Patient presents with   Follow-up    6 week follow up    Dyspnea:  works out at [employer] bike building up  Cough: [employer]: 2 pillows s  resp cc some HB  SABA use: hardly use  02: none  Lung cancer screening :  referred 10/29/2022 > not done as of 11/03/2023  Rec To get the most out of exercise, you need to be continuously aware that you are short of breath, but never out of breath, for at least 30 minutes daily.   Ok to continue reflux medications over the counger  Discuss this also with your primary provider     11/03/2023 Yearly  f/u ov/[place]  office/Coty Student re: GOLD 3 copd  maint on stiolto  LCSreferred again today  Chief Complaint  Patient presents with   COPD    F/u  Dyspnea:  Y several times a week / bike and treadmill = 40 min total most days  Cough: none  Sleeping: 2 pillows flat bed with overt HB SABA use: rarely now  02: none   Lung cancer screening: referred again 11/03/2023   No obvious day to day or daytime variability or assoc excess/ purulent sputum or mucus plugs or hemoptysis or cp or chest tightness, subjective wheeze or overt sinus     Also denies any obvious fluctuation of symptoms with weather or environmental changes or other aggravating or alleviating factors except as outlined above   No unusual exposure hx or h/o childhood pna/ asthma or knowledge of premature birth.  Current Allergies, Complete Past Medical History, Past Surgical History, Family History, and Social History were reviewed in [employer] record.  ROS  The following are not active complaints unless bolded Hoarseness,  sore throat, dysphagia, dental problems, itching, sneezing,  nasal congestion or discharge of excess mucus or purulent secretions, ear ache,   fever, chills, sweats, unintended wt loss or wt gain, classically pleuritic or exertional cp,  orthopnea pnd or arm/hand swelling  or leg swelling, presyncope, palpitations, abdominal pain, anorexia, nausea, vomiting, diarrhea  or change in bowel habits or change in bladder habits, change in stools or change in urine, dysuria, hematuria,  rash, arthralgias, visual complaints, headache, numbness, weakness or ataxia or problems with walking or coordination,  change in mood or  memory.        Current Meds  Medication Sig   alfuzosin  (UROXATRAL ) 10 MG 24 hr tablet Take 1 tablet (10 mg total) by mouth at bedtime.   fluticasone  (FLONASE ) 50 MCG/ACT nasal spray Place 2 sprays into both nostrils daily.   gabapentin  (NEURONTIN ) 300 MG capsule Take 1 capsule (300 mg total) by mouth at bedtime.   ibuprofen  (IBU) 800 MG tablet Take 1 tablet (800 mg total) by mouth every 8 (eight) hours as needed.   Multiple Vitamin (MULITIVITAMIN WITH MINERALS) TABS Take 1 tablet by mouth daily.   omeprazole  (PRILOSEC) 40 MG capsule Take 1 capsule (40 mg total) by mouth daily.   STIOLTO RESPIMAT 2.5-2.5 MCG/ACT AERS SMARTSIG:2 Puff(s) By Mouth Daily   valsartan  (DIOVAN ) 160 MG tablet Take 1 tablet (160 mg total) by mouth daily.   VENTOLIN  HFA 108 (90 Base) MCG/ACT inhaler Inhale 2 puffs into the lungs every 6 (six) hours as needed.           Past Medical History:  Diagnosis Date   COPD (chronic obstructive pulmonary disease) (HCC)    Hypertension       Objective:    Wts  11/03/2023        274  10/29/2022        281 09/17/2022        279   08/22/22 279 lb (126.6 kg)  06/18/20 270 lb (122.5 kg)  01/27/18 287 lb (130.2 kg)    Vital signs reviewed  11/03/2023  - Note at rest 02 sats  93% on RA   General appearance:    mod obese (by BMI) very pleasant amb wm nad   HEENT :  Oropharynx  clear   Nasal turbinates nl    NECK :  without  apparent JVD/ palpable Nodes/TM    LUNGS: no acc muscle use,  Mild barrel  contour  chest wall with bilateral  Distant bs s audible wheeze and  without cough on insp or exp maneuvers  and mild  Hyperresonant  to  percussion bilaterally     CV:  RRR  no s3 or murmur or increase in P2, and no edema   ABD:  quite obese soft and nontender   MS:  Nl gait/ ext warm without deformities Or obvious joint restrictions  calf tenderness, cyanosis or clubbing     SKIN: warm and dry without lesions    NEURO:  alert, approp, nl sensorium with  no motor or cerebellar deficits apparent.       Assessment   Assessment & Plan COPD GOLD 3 Quit smoking 2010 - try off acei 08/22/2022 and on prn saba - 08/22/2022  After extensive coaching inhaler device,  effectiveness =    75% hfa and has prn neb as well > start stiolto  - PFT's  08/28/22  FEV1 1.67 (43 % ) ratio 0.55  p 0 % improvement from saba p saba prior to study with DLCO  17.61 (59%)   and FV curve classically concave  and ERV 17% at wt 250   - 09/17/2022   continue stiolto    - 11/03/2023  After extensive coaching inhaler device,  effectiveness =    90% hfa and SMI but needs to remember to hold 5 sec min   Pt is Group B in terms of symptom/risk and laba/lama therefore appropriate rx at this point >>>  continue stiolto with prn saba   Re SABA :  I spent extra time with pt today reviewing appropriate use of albuterol  for prn use on exertion with the following points: 1) saba is for relief of sob that does not improve by walking a slower pace or resting but rather if the pt does not improve after trying this first. 2) If the pt is convinced, as many are, that saba helps recover from activity faster then it's easy to tell if this is the case by re-challenging : ie stop, take the inhaler, then p 5 minutes try the exact same activity (intensity of workload) that just caused the symptoms and see if  they are substantially diminished or not after saba 3) if there is an activity that reproducibly causes the symptoms, try the saba 15 min before the activity on alternate days   If in fact the saba really does help, then fine to continue to use it prn but advised may need to look closer at the maintenance regimen being used to achieve better control of airways disease with exertion.    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  Total time for H and P, chart review, counseling, reviewing hfa/smi  device(s) and generating customized AVS unique to this office visit /  same day charting = 30 min summary f/u ov   AVS  Patient Instructions  My office will be contacting you by phone for referral to lung cancer screening   [phone]) - if you don't hear back from my office within one week,  please call us  back or notify us  thru MyChart and we'll address it right away.    To get the most out of exercise, you need to be continuously aware that you are short of breath, but never out of breath, for at least 30 minutes daily. As you improve, it will actually be easier for you to do the same amount of exercise  in  30 minutes so always push to the level where you are short of breath.    Make sure you  check your oxygen  saturations at highest level of activity once a week and let me know if you ever  are losing ground   GERD (REFLUX)  is an extremely common cause of respiratory symptoms just like yours , many times with no obvious heartburn at all.    It can be treated with medication, but also with lifestyle changes including elevation of the head of your bed (ideally with 6 -8inch blocks(risers)  under the headboard of your bed),  Smoking cessation, avoidance of late meals, excessive alcohol, and avoid fatty foods, chocolate, peppermint, colas, red wine, and acidic juices such as orange juice.  NO MINT OR MENTHOL PRODUCTS SO NO COUGH DROPS  USE SUGARLESS CANDY INSTEAD (Jolley ranchers or Stover's or Life Savers) or even ice chips will also do - the key is to swallow to prevent all throat clearing. NO OIL BASED VITAMINS - use powdered substitutes.  Avoid fish oil when coughing.    Please schedule a follow up visit in  6 months but call sooner if needed          Ozell America, MD 11/03/2023

## 2023-11-03 NOTE — Patient Instructions (Addendum)
 My office will be contacting you by phone for referral to lung cancer screening   [phone]) - if you don't hear back from my office within one week,  please call us  back or notify us  thru MyChart and we'll address it right away.    To get the most out of exercise, you need to be continuously aware that you are short of breath, but never out of breath, for at least 30 minutes daily. As you improve, it will actually be easier for you to do the same amount of exercise  in  30 minutes so always push to the level where you are short of breath.    Make sure you check your oxygen  saturations at highest level of activity once a week and let me know if you ever  are losing ground   GERD (REFLUX)  is an extremely common cause of respiratory symptoms just like yours , many times with no obvious heartburn at all.    It can be treated with medication, but also with lifestyle changes including elevation of the head of your bed (ideally with 6 -8inch blocks(risers)  under the headboard of your bed),  Smoking cessation, avoidance of late meals, excessive alcohol, and avoid fatty foods, chocolate, peppermint, colas, red wine, and acidic juices such as orange juice.  NO MINT OR MENTHOL PRODUCTS SO NO COUGH DROPS  USE SUGARLESS CANDY INSTEAD (Jolley ranchers or Stover's or Life Savers) or even ice chips will also do - the key is to swallow to prevent all throat clearing. NO OIL BASED VITAMINS - use powdered substitutes.  Avoid fish oil when coughing.    Please schedule a follow up visit in  6 months but call sooner if needed

## 2023-11-03 NOTE — Assessment & Plan Note (Addendum)
 Quit smoking 2010 - try off acei 08/22/2022 and on prn saba - 08/22/2022  After extensive coaching inhaler device,  effectiveness =    75% hfa and has prn neb as well > start stiolto  - PFT's  08/28/22  FEV1 1.67 (43 % ) ratio 0.55  p 0 % improvement from saba p saba prior to study with DLCO  17.61 (59%)   and FV curve classically concave  and ERV 17% at wt 250   - 09/17/2022   continue stiolto    - 11/03/2023  After extensive coaching inhaler device,  effectiveness =    90% hfa and SMI but needs to remember to hold 5 sec min   Pt is Group B in terms of symptom/risk and laba/lama therefore appropriate rx at this point >>>  continue stiolto with prn saba   Re SABA :  I spent extra time with pt today reviewing appropriate use of albuterol  for prn use on exertion with the following points: 1) saba is for relief of sob that does not improve by walking a slower pace or resting but rather if the pt does not improve after trying this first. 2) If the pt is convinced, as many are, that saba helps recover from activity faster then it's easy to tell if this is the case by re-challenging : ie stop, take the inhaler, then p 5 minutes try the exact same activity (intensity of workload) that just caused the symptoms and see if they are substantially diminished or not after saba 3) if there is an activity that reproducibly causes the symptoms, try the saba 15 min before the activity on alternate days   If in fact the saba really does help, then fine to continue to use it prn but advised may need to look closer at the maintenance regimen being used to achieve better control of airways disease with exertion.

## 2023-11-04 ENCOUNTER — Encounter: Payer: Self-pay | Admitting: Gastroenterology

## 2023-11-04 ENCOUNTER — Ambulatory Visit: Admitting: Gastroenterology

## 2023-11-04 ENCOUNTER — Encounter: Payer: Self-pay | Admitting: *Deleted

## 2023-11-04 VITALS — BP 118/72 | HR 74 | Temp 98.6°F | Ht 73.5 in | Wt 274.8 lb

## 2023-11-04 DIAGNOSIS — R1310 Dysphagia, unspecified: Secondary | ICD-10-CM | POA: Insufficient documentation

## 2023-11-04 DIAGNOSIS — R09A2 Foreign body sensation, throat: Secondary | ICD-10-CM

## 2023-11-04 MED ORDER — LINACLOTIDE 72 MCG PO CAPS: 72.0000 ug | ORAL_CAPSULE | Freq: Every day | ORAL | Status: AC

## 2023-11-04 NOTE — Addendum Note (Signed)
 Addended by: WELLINGTON MILLING on: 11/04/2023 10:10 AM   Modules accepted: Orders

## 2023-11-04 NOTE — Progress Notes (Signed)
 Gastroenterology Office Note    Referring Provider: Grooms, [name], [place] Primary Care Physician:  Grooms, [name], [place]  Primary GI: Dr. Cindie    Chief Complaint   Chief Complaint  Patient presents with   New Patient (Initial Visit)    Pt here for chronic GERD     History of Present Illness   [name] is a 68 y.o. male presenting today at the request of Grooms, [name], PA-C due to chronic GERD. He was last seen in our office in 2019 for routine screening colonoscopy.  Today, he denies any history of chronic GERD. States for last few months feels like throat is very dry and will go down wrong pipe. Has to clear throat. Denies hx of chronic GERD. On omeprazole  daily just X one month. Has had some improvement with this. Will sometimes have salad or pieces stuck in his throat.   Had changes in inhaler meds and now throat is dried out. No odynophagia. No lack of appetite. No N/V. Sometimes feels like food gets hung in mid esophagus. Has been on keto diet. Only drinking water . No sodas.   Feels constipated. Unproductive stool, sticky. Has to strain. No diarrhea. Eating an apple daily. Eating lots of peanut butter. Noted changes when on keto diet.    Colonoscopy 2019: normal appearing exam of TI, slightly torturous left colon, internal hemorrhoids, external hemorrhoids.   NO FH colon cancer or polyps No prior EGD   Past Medical History:  Diagnosis Date   COPD (chronic obstructive pulmonary disease) (HCC)    Hypertension     Past Surgical History:  Procedure Laterality Date   APPENDECTOMY     COLONOSCOPY N/A 04/24/2017   Procedure: COLONOSCOPY;  Surgeon: Harvey Margo CROME, MD;  Location: [facility];  Service: Endoscopy;  Laterality: N/A;  1:00pm   DG 3RD DIGIT LEFT HAND     ligament torn, surgical repair of middle finger as well.   HEMORRHOID SURGERY     HERNIA REPAIR     RLQ, umbilical    Current Outpatient Medications  Medication Sig Dispense Refill    alfuzosin  (UROXATRAL ) 10 MG 24 hr tablet Take 1 tablet (10 mg total) by mouth at bedtime. 30 tablet 11   ALPRAZolam (XANAX) 0.5 MG tablet Take 0.5 mg by mouth at bedtime.     fluticasone  (FLONASE ) 50 MCG/ACT nasal spray Place 2 sprays into both nostrils daily. 16 g 0   gabapentin  (NEURONTIN ) 300 MG capsule Take 1 capsule (300 mg total) by mouth at bedtime. 90 capsule 1   ibuprofen  (IBU) 800 MG tablet Take 1 tablet (800 mg total) by mouth every 8 (eight) hours as needed. 30 tablet 1   Multiple Vitamin (MULITIVITAMIN WITH MINERALS) TABS Take 1 tablet by mouth daily.     omeprazole  (PRILOSEC) 40 MG capsule Take 1 capsule (40 mg total) by mouth daily. 30 capsule 3   STIOLTO RESPIMAT 2.5-2.5 MCG/ACT AERS 1-2 puffs each am 4 g 11   valsartan  (DIOVAN ) 160 MG tablet Take 1 tablet (160 mg total) by mouth daily. 30 tablet 11   VENTOLIN  HFA 108 (90 Base) MCG/ACT inhaler Inhale 2 puffs into the lungs every 6 (six) hours as needed.     albuterol  (PROVENTIL ) (2.5 MG/3ML) 0.083% nebulizer solution Take 2.5 mg by nebulization every 6 (six) hours as needed for wheezing or shortness of breath. (Patient not taking: Reported on 11/04/2023)     cetirizine  (ZYRTEC ) 10 MG tablet Take 1 tablet (10 mg total) by  mouth daily. (Patient not taking: Reported on 11/04/2023) 30 tablet 0   dutasteride  (AVODART ) 0.5 MG capsule Take 1 capsule (0.5 mg total) by mouth daily. (Patient not taking: Reported on 11/04/2023) 90 capsule 3   Glucosamine-Chondroitin (OSTEO BI-FLEX REGULAR STRENGTH PO) Take 1 tablet by mouth daily. (Patient not taking: Reported on 11/04/2023)     valsartan  (DIOVAN ) 160 MG tablet Take 1 tablet (160 mg total) by mouth daily. 90 tablet 1   No current facility-administered medications for this visit.    Allergies as of 11/04/2023   (No Known Allergies)    Family History  Problem Relation Age of Onset   Seizures Mother    Hypertension Mother    COPD Father    Colon cancer Neg Hx    Gastric cancer Neg Hx     Esophageal cancer Neg Hx     Social History   Socioeconomic History   Marital status: Married    Spouse name: Not on file   Number of children: Not on file   Years of education: Not on file   Highest education level: Not on file  Occupational History   Not on file  Tobacco Use   Smoking status: Former    Current packs/day: 0.00    Average packs/day: 0.3 packs/day for 40.0 years (12.0 ttl pk-yrs)    Types: Cigarettes    Start date: 01/28/1973    Quit date: 01/28/2013    Years since quitting: 10.7   Smokeless tobacco: Never  Substance and Sexual Activity   Alcohol use: No   Drug use: No   Sexual activity: Yes  Other Topics Concern   Not on file  Social History Narrative   Not on file   Social Drivers of Health   Financial Resource Strain: Not on file  Food Insecurity: Not on file  Transportation Needs: Not on file  Physical Activity: Not on file  Stress: Not on file  Social Connections: Not on file  Intimate Partner Violence: Not on file     Review of Systems   Gen: Denies any fever, chills, fatigue, weight loss, lack of appetite.  CV: Denies chest pain, heart palpitations, peripheral edema, syncope.  Resp: Denies shortness of breath at rest or with exertion. Denies wheezing or cough.  GI: Denies dysphagia or odynophagia. Denies jaundice, hematemesis, fecal incontinence. GU : Denies urinary burning, urinary frequency, urinary hesitancy MS: Denies joint pain, muscle weakness, cramps, or limitation of movement.  Derm: Denies rash, itching, dry skin Psych: Denies depression, anxiety, memory loss, and confusion Heme: Denies bruising, bleeding, and enlarged lymph nodes.   Physical Exam   BP 118/72   Pulse 74   Temp 98.6 F (37 C)   Ht 6' 1.5 (1.867 m)   Wt 274 lb 12.8 oz (124.6 kg)   BMI 35.76 kg/m  General:   Alert and oriented. Pleasant and cooperative. Well-nourished and well-developed.  Head:  Normocephalic and atraumatic. Eyes:  Without icterus Ears:   Normal auditory acuity. Lungs:  Clear to auscultation bilaterally.  Heart:  S1, S2 present without murmurs appreciated.  Abdomen:  +BS, soft, obese, non-tender and non-distended. No HSM noted. No guarding or rebound. Likely ventral hernia Rectal:  Deferred  Msk:  Symmetrical without gross deformities. Normal posture. Extremities:  Without edema. Neurologic:  Alert and  oriented x4;  grossly normal neurologically. Skin:  Intact without significant lesions or rashes. Psych:  Alert and cooperative. Normal mood and affect.   Assessment   Chucky Homes is a 68  y.o. male presenting today at the request of Grooms, [name], [place] due to chronic GERD. He was last seen in our office in 2019 for routine screening colonoscopy.  Upon further questioning, he denies any obvious chronic GERD symptoms. However, he has had several months of feeling a globus sensation, having to clear throat, feeling food stuck in throat, which is more oropharyngeal. However, he does admit to some solid food dysphagia as well. Some improvement on PPI for past month. Recommend EGD/dilation in near future. May need ENT if persistent oropharyngeal symptoms.   Constipation: noted while on keto diet. No alarm signs/symptoms. Add Benefiber daily. He would also like to trial a prescriptive agent. Will start low dose Linzess 72 mcg. Colonoscopy on file from 2019 without polyps, and he has no FH colon cancer or polyps. Next colonoscopy in 2029 barring clinical changes.    PLAN   Continue PPI  Proceed with upper endoscopy/dilation by Dr. Cindie in near future: the risks, benefits, and alternatives have been discussed with the patient in detail. The patient states understanding and desires to proceed.   ENT if persistent oropharyngeal component  Benefiber daily. Samples of Linzess 72 mcg daily.   Colonoscopy in 2029 or sooner if clinical changes   Therisa MICAEL Stager, PhD, [facility]

## 2023-11-04 NOTE — Progress Notes (Signed)
 Medication Samples have been provided to the patient.  Drug name: Linzess       Strength: 72 mcg        Qty: 3  LOT: [id number]  Exp.Date: 04/27/2025  Dosing instructions: Take 1 capsule daily 30 mins before breakfast  The patient has been instructed regarding the correct time, dose, and frequency of taking this medication, including desired effects and most common side effects.   Daniel [name] 10:08 AM 11/04/2023

## 2023-11-04 NOTE — H&P (View-Only) (Signed)
 Gastroenterology Office Note    Referring Provider: Grooms, [name], [place] Primary Care Physician:  Grooms, [name], [place]  Primary GI: Dr. Cindie    Chief Complaint   Chief Complaint  Patient presents with   New Patient (Initial Visit)    Pt here for chronic GERD     History of Present Illness   Daniel [name] is a 68 y.o. male presenting today at the request of Grooms, [name], PA-C due to chronic GERD. He was last seen in our office in 2019 for routine screening colonoscopy.  Today, he denies any history of chronic GERD. States for last few months feels like throat is very dry and will go down wrong pipe. Has to clear throat. Denies hx of chronic GERD. On omeprazole  daily just X one month. Has had some improvement with this. Will sometimes have salad or pieces stuck in his throat.   Had changes in inhaler meds and now throat is dried out. No odynophagia. No lack of appetite. No N/V. Sometimes feels like food gets hung in mid esophagus. Has been on keto diet. Only drinking water . No sodas.   Feels constipated. Unproductive stool, sticky. Has to strain. No diarrhea. Eating an apple daily. Eating lots of peanut butter. Noted changes when on keto diet.    Colonoscopy 2019: normal appearing exam of TI, slightly torturous left colon, internal hemorrhoids, external hemorrhoids.   NO FH colon cancer or polyps No prior EGD   Past Medical History:  Diagnosis Date   COPD (chronic obstructive pulmonary disease) (HCC)    Hypertension     Past Surgical History:  Procedure Laterality Date   APPENDECTOMY     COLONOSCOPY N/A 04/24/2017   Procedure: COLONOSCOPY;  Surgeon: Harvey Margo CROME, MD;  Location: [facility];  Service: Endoscopy;  Laterality: N/A;  1:00pm   DG 3RD DIGIT LEFT HAND     ligament torn, surgical repair of middle finger as well.   HEMORRHOID SURGERY     HERNIA REPAIR     RLQ, umbilical    Current Outpatient Medications  Medication Sig Dispense Refill    alfuzosin  (UROXATRAL ) 10 MG 24 hr tablet Take 1 tablet (10 mg total) by mouth at bedtime. 30 tablet 11   ALPRAZolam (XANAX) 0.5 MG tablet Take 0.5 mg by mouth at bedtime.     fluticasone  (FLONASE ) 50 MCG/ACT nasal spray Place 2 sprays into both nostrils daily. 16 g 0   gabapentin  (NEURONTIN ) 300 MG capsule Take 1 capsule (300 mg total) by mouth at bedtime. 90 capsule 1   ibuprofen  (IBU) 800 MG tablet Take 1 tablet (800 mg total) by mouth every 8 (eight) hours as needed. 30 tablet 1   Multiple Vitamin (MULITIVITAMIN WITH MINERALS) TABS Take 1 tablet by mouth daily.     omeprazole  (PRILOSEC) 40 MG capsule Take 1 capsule (40 mg total) by mouth daily. 30 capsule 3   STIOLTO RESPIMAT 2.5-2.5 MCG/ACT AERS 1-2 puffs each am 4 g 11   valsartan  (DIOVAN ) 160 MG tablet Take 1 tablet (160 mg total) by mouth daily. 30 tablet 11   VENTOLIN  HFA 108 (90 Base) MCG/ACT inhaler Inhale 2 puffs into the lungs every 6 (six) hours as needed.     albuterol  (PROVENTIL ) (2.5 MG/3ML) 0.083% nebulizer solution Take 2.5 mg by nebulization every 6 (six) hours as needed for wheezing or shortness of breath. (Patient not taking: Reported on 11/04/2023)     cetirizine  (ZYRTEC ) 10 MG tablet Take 1 tablet (10 mg total) by  mouth daily. (Patient not taking: Reported on 11/04/2023) 30 tablet 0   dutasteride  (AVODART ) 0.5 MG capsule Take 1 capsule (0.5 mg total) by mouth daily. (Patient not taking: Reported on 11/04/2023) 90 capsule 3   Glucosamine-Chondroitin (OSTEO BI-FLEX REGULAR STRENGTH PO) Take 1 tablet by mouth daily. (Patient not taking: Reported on 11/04/2023)     valsartan  (DIOVAN ) 160 MG tablet Take 1 tablet (160 mg total) by mouth daily. 90 tablet 1   No current facility-administered medications for this visit.    Allergies as of 11/04/2023   (No Known Allergies)    Family History  Problem Relation Age of Onset   Seizures Mother    Hypertension Mother    COPD Father    Colon cancer Neg Hx    Gastric cancer Neg Hx     Esophageal cancer Neg Hx     Social History   Socioeconomic History   Marital status: Married    Spouse name: Not on file   Number of children: Not on file   Years of education: Not on file   Highest education level: Not on file  Occupational History   Not on file  Tobacco Use   Smoking status: Former    Current packs/day: 0.00    Average packs/day: 0.3 packs/day for 40.0 years (12.0 ttl pk-yrs)    Types: Cigarettes    Start date: 01/28/1973    Quit date: 01/28/2013    Years since quitting: 10.7   Smokeless tobacco: Never  Substance and Sexual Activity   Alcohol use: No   Drug use: No   Sexual activity: Yes  Other Topics Concern   Not on file  Social History Narrative   Not on file   Social Drivers of Health   Financial Resource Strain: Not on file  Food Insecurity: Not on file  Transportation Needs: Not on file  Physical Activity: Not on file  Stress: Not on file  Social Connections: Not on file  Intimate Partner Violence: Not on file     Review of Systems   Gen: Denies any fever, chills, fatigue, weight loss, lack of appetite.  CV: Denies chest pain, heart palpitations, peripheral edema, syncope.  Resp: Denies shortness of breath at rest or with exertion. Denies wheezing or cough.  GI: Denies dysphagia or odynophagia. Denies jaundice, hematemesis, fecal incontinence. GU : Denies urinary burning, urinary frequency, urinary hesitancy MS: Denies joint pain, muscle weakness, cramps, or limitation of movement.  Derm: Denies rash, itching, dry skin Psych: Denies depression, anxiety, memory loss, and confusion Heme: Denies bruising, bleeding, and enlarged lymph nodes.   Physical Exam   BP 118/72   Pulse 74   Temp 98.6 F (37 C)   Ht 6' 1.5 (1.867 m)   Wt 274 lb 12.8 oz (124.6 kg)   BMI 35.76 kg/m  General:   Alert and oriented. Pleasant and cooperative. Well-nourished and well-developed.  Head:  Normocephalic and atraumatic. Eyes:  Without icterus Ears:   Normal auditory acuity. Lungs:  Clear to auscultation bilaterally.  Heart:  S1, S2 present without murmurs appreciated.  Abdomen:  +BS, soft, obese, non-tender and non-distended. No HSM noted. No guarding or rebound. Likely ventral hernia Rectal:  Deferred  Msk:  Symmetrical without gross deformities. Normal posture. Extremities:  Without edema. Neurologic:  Alert and  oriented x4;  grossly normal neurologically. Skin:  Intact without significant lesions or rashes. Psych:  Alert and cooperative. Normal mood and affect.   Assessment   Daniel [name] is a 68  y.o. male presenting today at the request of Grooms, [name], [place] due to chronic GERD. He was last seen in our office in 2019 for routine screening colonoscopy.  Upon further questioning, he denies any obvious chronic GERD symptoms. However, he has had several months of feeling a globus sensation, having to clear throat, feeling food stuck in throat, which is more oropharyngeal. However, he does admit to some solid food dysphagia as well. Some improvement on PPI for past month. Recommend EGD/dilation in near future. May need ENT if persistent oropharyngeal symptoms.   Constipation: noted while on keto diet. No alarm signs/symptoms. Add Benefiber daily. He would also like to trial a prescriptive agent. Will start low dose Linzess 72 mcg. Colonoscopy on file from 2019 without polyps, and he has no FH colon cancer or polyps. Next colonoscopy in 2029 barring clinical changes.    PLAN   Continue PPI  Proceed with upper endoscopy/dilation by Dr. Cindie in near future: the risks, benefits, and alternatives have been discussed with the patient in detail. The patient states understanding and desires to proceed.   ENT if persistent oropharyngeal component  Benefiber daily. Samples of Linzess 72 mcg daily.   Colonoscopy in 2029 or sooner if clinical changes   Therisa MICAEL Stager, PhD, [facility]astroenter[facility]

## 2023-11-04 NOTE — Patient Instructions (Signed)
 I recommend starting Benefiber daily. This is over the counter. Take 2 teaspoons each day in the beverage of your choice, and you can increase up to three times a day.   For constipation, you can try the lowest dosage of Linzess. Linzess works best when taken once a day every day, on an empty stomach, at least 30 minutes before your first meal of the day.  When Linzess is taken daily as directed:  *Constipation relief is typically felt in about a week *IBS-C patients may begin to experience relief from belly pain and overall abdominal symptoms (pain, discomfort, and bloating) in about 1 week,   with symptoms typically improving over 12 weeks.  Diarrhea may occur in the first 2 weeks -keep taking it.  The diarrhea should go away and you should start having normal, complete, full bowel movements. It may be helpful to start treatment when you can be near the comfort of your own bathroom, such as a weekend.   We are arranging an upper endoscopy with dilation by Dr. Cindie in the near future!

## 2023-11-07 ENCOUNTER — Other Ambulatory Visit: Payer: Self-pay

## 2023-11-07 ENCOUNTER — Ambulatory Visit
Admission: RE | Admit: 2023-11-07 | Discharge: 2023-11-07 | Disposition: A | Discharge status: Home / Self Care | Source: Ambulatory Visit | Attending: Nurse Practitioner | Admitting: Nurse Practitioner

## 2023-11-07 VITALS — BP 124/81 | HR 68 | Temp 97.7°F | Resp 20

## 2023-11-07 DIAGNOSIS — Z8739 Personal history of other diseases of the musculoskeletal system and connective tissue: Secondary | ICD-10-CM | POA: Diagnosis not present

## 2023-11-07 DIAGNOSIS — S39012A Strain of muscle, fascia and tendon of lower back, initial encounter: Secondary | ICD-10-CM

## 2023-11-07 MED ORDER — PREDNISONE 20 MG PO TABS
40.0000 mg | ORAL_TABLET | Freq: Every day | ORAL | 0 refills | Status: AC
Start: 2023-11-07 — End: 2023-11-12

## 2023-11-07 MED ORDER — DEXAMETHASONE SOD PHOSPHATE PF 10 MG/ML IJ SOLN
10.0000 mg | Freq: Once | INTRAMUSCULAR | Status: AC
  Administered 2023-11-07: 10 mg via INTRAMUSCULAR
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
## 2023-11-07 NOTE — ED Triage Notes (Addendum)
 Pt reports lower back pain/left flank pain since playing golf on Saturday. Pt reports pain had improved but returned last night. Denies any urinary symptoms,pt states its not my kidneys.

## 2023-11-07 NOTE — Discharge Instructions (Signed)
 You were given an injection of Decadron  10 mg.  Start the prednisone  tomorrow. Take medication as prescribed. Try to remain as active as possible. Gentle range of motion and stretching exercises to help with back spasm and pain. May apply ice or heat as needed.  Ice is recommended for pain or swelling, heat for spasm or stiffness.  Apply for 20 minutes, remove for 1 hour, then repeat. May take over-the-counter Tylenol  for pain or discomfort. Go to the emergency department immediately if you develop weakness in your legs or feet, inability to walk, loss of bowel or bladder function, difficulty urinating or passing a bowel movement, or other concerns. If symptoms fail to improve with this treatment, recommend follow-up with your primary care physician for further evaluation. Follow-up as needed.

## 2023-11-12 ENCOUNTER — Ambulatory Visit: Admitting: Adult Health

## 2023-11-12 ENCOUNTER — Ambulatory Visit (HOSPITAL_COMMUNITY)
Admission: RE | Admit: 2023-11-12 | Discharge: 2023-11-12 | Disposition: A | Discharge status: Home / Self Care | Source: Ambulatory Visit | Attending: Acute Care | Admitting: Acute Care

## 2023-11-12 ENCOUNTER — Encounter: Payer: Self-pay | Admitting: Adult Health

## 2023-11-12 DIAGNOSIS — Z87891 Personal history of nicotine dependence: Secondary | ICD-10-CM | POA: Insufficient documentation

## 2023-11-12 DIAGNOSIS — Z122 Encounter for screening for malignant neoplasm of respiratory organs: Secondary | ICD-10-CM | POA: Diagnosis not present

## 2023-11-12 DIAGNOSIS — F17210 Nicotine dependence, cigarettes, uncomplicated: Secondary | ICD-10-CM | POA: Diagnosis not present

## 2023-11-12 NOTE — Progress Notes (Signed)
  Virtual Visit via Telephone Note  I connected with Daniel [name] , 11/12/23 10:34 AM by a telemedicine application and verified that I am speaking with the correct person using two identifiers.  Location: Patient: home Provider: home   I discussed the limitations of evaluation and management by telemedicine and the availability of in person appointments. The patient expressed understanding and agreed to proceed.   Shared Decision Making Visit Lung Cancer Screening Program [phone])   Eligibility: 68 y.o. Pack Years Smoking History Calculation = 39 pack years (# packs/per year x # years smoked) Recent History of coughing up blood  no Unexplained weight loss? no ( >Than 15 pounds within the last 6 months ) Prior History Lung / other cancer no (Diagnosis within the last 5 years already requiring surveillance chest CT Scans). Smoking Status Former Smoker Former Smokers: Years since quit: 10 years  Quit Date: 2015  Visit Components: Discussion included one or more decision making aids. YES Discussion included risk/benefits of screening. YES Discussion included potential follow up diagnostic testing for abnormal scans. YES Discussion included meaning and risk of over diagnosis. YES Discussion included meaning and risk of False Positives. YES Discussion included meaning of total radiation exposure. YES  Counseling Included: Importance of adherence to annual lung cancer LDCT screening. YES Impact of comorbidities on ability to participate in the program. YES Ability and willingness to under diagnostic treatment. YES  Smoking Cessation Counseling: Former Smokers:  Discussed the importance of maintaining cigarette abstinence. yes Diagnosis Code: Personal History of Nicotine  Dependence. S12.108 Information about tobacco cessation classes and interventions provided to patient. Yes Patient provided with ticket for LDCT Scan. yes Written Order for Lung Cancer Screening with LDCT  placed in Epic. Yes (CT Chest Lung Cancer Screening Low Dose W/O CM) [id number]  Z12.2-Screening of respiratory organs Z87.891-Personal history of nicotine  dependence   Daniel [name] 11/12/23

## 2023-11-12 NOTE — Patient Instructions (Signed)

## 2023-11-14 ENCOUNTER — Telehealth: Payer: Self-pay | Admitting: Acute Care

## 2023-11-14 DIAGNOSIS — I712 Thoracic aortic aneurysm, without rupture, unspecified: Secondary | ICD-10-CM

## 2023-11-14 NOTE — Telephone Encounter (Signed)
 First scan, read as a LR 4 A because of a 10.4 mm nodule. 3 month follow up is fine here. Please order scan for 02/12/2024.Send results to PCP. I called the  patient and let him know.   There was also a notation of an Aneurysmal ascending thoracic aorta measures 4.6 x 4.4 cm. Ascending thoracic aortic aneurysm. Recommend semi-annual imaging follow up by CTA or MRA and referral to cardiothoracic surgery if not already obtained.   Thanks so much. I made the referral, he is aware.

## 2023-11-14 NOTE — Telephone Encounter (Signed)
 Received call report on pt's LDCT from [facility] radiology. Report is below.   IMPRESSION: 1. Lung-RADS 4A, suspicious. Follow up low-dose chest CT without contrast in 3 months (please use the following order, CT CHEST LCS NODULE FOLLOW-UP W/O CM) is recommended. Alternatively, PET may be considered when there is a solid component 8mm or larger. Perifissural right lower lobe nodules measuring up to 10.4 mm, likely perifissural lymph nodes, however given size, follow-up is recommended. 2. Aneurysmal ascending thoracic aorta measures 4.6 x 4.4 cm. Ascending thoracic aortic aneurysm. Recommend semi-annual imaging followup by CTA or MRA and referral to cardiothoracic surgery if not already obtained. This recommendation follows 2010 ACCF/AHA/AATS/ACR/ASA/SCA/SCAI/SIR/STS/SVM Guidelines for the Diagnosis and Management of Patients With Thoracic Aortic Disease. Circulation. 2010; 121: Z733-z630. Aortic aneurysm NOS (ICD10-I71.9) 3. Aortic Atherosclerosis (ICD10-I70.0) and Emphysema (ICD10-J43.9).   These results will be called to the ordering clinician or representative by the Radiologist Assistant, and communication documented in the PACS or [employer].     Electronically Signed   By: Limin  Xu M.D.   On: 11/14/2023 13:09

## 2023-11-17 ENCOUNTER — Other Ambulatory Visit: Payer: Self-pay

## 2023-11-17 DIAGNOSIS — Z122 Encounter for screening for malignant neoplasm of respiratory organs: Secondary | ICD-10-CM

## 2023-11-17 DIAGNOSIS — Z87891 Personal history of nicotine dependence: Secondary | ICD-10-CM

## 2023-11-17 DIAGNOSIS — R911 Solitary pulmonary nodule: Secondary | ICD-10-CM

## 2023-11-17 NOTE — Telephone Encounter (Signed)
 Provider spoke with the patient 11/14/2023

## 2023-11-17 NOTE — Telephone Encounter (Signed)
Order placed and reminder set. ?

## 2023-11-21 ENCOUNTER — Ambulatory Visit (HOSPITAL_COMMUNITY): Admitting: Anesthesiology

## 2023-11-21 ENCOUNTER — Encounter (HOSPITAL_COMMUNITY)
Admission: RE | Disposition: A | Discharge status: Home / Self Care | Payer: Self-pay | Source: Home / Self Care | Attending: Internal Medicine

## 2023-11-21 ENCOUNTER — Encounter (HOSPITAL_COMMUNITY): Payer: Self-pay | Admitting: Internal Medicine

## 2023-11-21 ENCOUNTER — Ambulatory Visit (HOSPITAL_COMMUNITY)
Admission: RE | Admit: 2023-11-21 | Discharge: 2023-11-21 | Disposition: A | Discharge status: Home / Self Care | Attending: Internal Medicine | Admitting: Internal Medicine

## 2023-11-21 ENCOUNTER — Other Ambulatory Visit: Payer: Self-pay

## 2023-11-21 DIAGNOSIS — Z87891 Personal history of nicotine dependence: Secondary | ICD-10-CM | POA: Diagnosis not present

## 2023-11-21 DIAGNOSIS — Z79899 Other long term (current) drug therapy: Secondary | ICD-10-CM | POA: Insufficient documentation

## 2023-11-21 DIAGNOSIS — R1312 Dysphagia, oropharyngeal phase: Secondary | ICD-10-CM | POA: Diagnosis not present

## 2023-11-21 DIAGNOSIS — K2100 Gastro-esophageal reflux disease with esophagitis, without bleeding: Secondary | ICD-10-CM | POA: Diagnosis not present

## 2023-11-21 DIAGNOSIS — K2950 Unspecified chronic gastritis without bleeding: Secondary | ICD-10-CM | POA: Diagnosis not present

## 2023-11-21 DIAGNOSIS — I10 Essential (primary) hypertension: Secondary | ICD-10-CM | POA: Diagnosis not present

## 2023-11-21 DIAGNOSIS — K2970 Gastritis, unspecified, without bleeding: Secondary | ICD-10-CM | POA: Diagnosis not present

## 2023-11-21 DIAGNOSIS — J449 Chronic obstructive pulmonary disease, unspecified: Secondary | ICD-10-CM | POA: Diagnosis not present

## 2023-11-21 DIAGNOSIS — K648 Other hemorrhoids: Secondary | ICD-10-CM | POA: Insufficient documentation

## 2023-11-21 DIAGNOSIS — B9681 Helicobacter pylori [H. pylori] as the cause of diseases classified elsewhere: Secondary | ICD-10-CM | POA: Diagnosis not present

## 2023-11-21 DIAGNOSIS — K219 Gastro-esophageal reflux disease without esophagitis: Secondary | ICD-10-CM | POA: Insufficient documentation

## 2023-11-21 HISTORY — PX: ESOPHAGOGASTRODUODENOSCOPY: SHX5428

## 2023-11-21 SURGERY — EGD (ESOPHAGOGASTRODUODENOSCOPY)
Anesthesia: General

## 2023-11-21 MED ORDER — PROPOFOL 10 MG/ML IV BOLUS
INTRAVENOUS | Status: DC | PRN
  Administered 2023-11-21: 150 ug/kg/min via INTRAVENOUS
  Administered 2023-11-21: 100 mg via INTRAVENOUS

## 2023-11-21 MED ORDER — LIDOCAINE HCL (CARDIAC) PF 100 MG/5ML IV SOSY
PREFILLED_SYRINGE | INTRAVENOUS | Status: DC | PRN
  Administered 2023-11-21: 50 mg via INTRAVENOUS

## 2023-11-21 MED ORDER — LACTATED RINGERS IV SOLN: INTRAVENOUS | Status: DC

## 2023-11-21 NOTE — Transfer of Care (Signed)
 Immediate Anesthesia Transfer of Care Note  Patient: Daniel [name]  Procedure(s) Performed: EGD (ESOPHAGOGASTRODUODENOSCOPY)  Patient Location: Endoscopy Unit  Anesthesia Type:General  Level of Consciousness: drowsy, patient cooperative, and responds to stimulation  Airway & Oxygen  Therapy: Patient Spontanous Breathing and Patient connected to nasal cannula oxygen   Post-op Assessment: Report given to RN and Post -op Vital signs reviewed and stable  Post vital signs: Reviewed and stable  Last Vitals:  Vitals Value Taken Time  BP 122/60 11/21/23 12:29  Temp 36.8 C 11/21/23 12:29  Pulse 71 11/21/23 12:29  Resp 19 11/21/23 12:29  SpO2 94 % 11/21/23 12:29    Last Pain:  Vitals:   11/21/23 1229  TempSrc: Oral  PainSc:       Patients Stated Pain Goal: 4 (11/21/23 1121)  Complications: No notable events documented.

## 2023-11-21 NOTE — Anesthesia Preprocedure Evaluation (Signed)
 Anesthesia Evaluation  Patient identified by MRN, date of birth, ID band Patient awake    Reviewed: Allergy & Precautions, H&P , NPO status , Patient's Chart, lab work & pertinent test results, reviewed documented beta blocker date and time   Airway Mallampati: II  TM Distance: >3 FB Neck ROM: full    Dental no notable dental hx.    Pulmonary COPD, former smoker   Pulmonary exam normal breath sounds clear to auscultation       Cardiovascular Exercise Tolerance: Good hypertension,  Rhythm:regular Rate:Normal     Neuro/Psych  Neuromuscular disease  negative psych ROS   GI/Hepatic Neg liver ROS,GERD  ,,  Endo/Other  negative endocrine ROS    Renal/GU negative Renal ROS  negative genitourinary   Musculoskeletal   Abdominal   Peds  Hematology negative hematology ROS (+)   Anesthesia Other Findings   Reproductive/Obstetrics negative OB ROS                              Anesthesia Physical Anesthesia Plan  ASA: 2  Anesthesia Plan: General   Post-op Pain Management:    Induction:   PONV Risk Score and Plan: Propofol infusion  Airway Management Planned:   Additional Equipment:   Intra-op Plan:   Post-operative Plan:   Informed Consent: I have reviewed the patients History and Physical, chart, labs and discussed the procedure including the risks, benefits and alternatives for the proposed anesthesia with the patient or authorized representative who has indicated his/her understanding and acceptance.     Dental Advisory Given  Plan Discussed with: CRNA  Anesthesia Plan Comments:         Anesthesia Quick Evaluation

## 2023-11-21 NOTE — Interval H&P Note (Signed)
 History and Physical Interval Note:  11/21/2023 11:41 AM  [name]  has presented today for surgery, with the diagnosis of DYSPHAGIA,GERD.  The various methods of treatment have been discussed with the patient and family. After consideration of risks, benefits and other options for treatment, the patient has consented to  Procedure(s) with comments: EGD (ESOPHAGOGASTRODUODENOSCOPY) (N/A) - 1:15 PM, ASA 2 DILATION, ESOPHAGUS (N/A) as a surgical intervention.  The patient's history has been reviewed, patient examined, no change in status, stable for surgery.  I have reviewed the patient's chart and labs.  Questions were answered to the patient's satisfaction.     Carlin MARLA Hasty

## 2023-11-21 NOTE — Op Note (Signed)
 [facility] Patient Name: Daniel [name] Procedure Date: 11/21/2023 12:08 PM MRN: [id number] Date of Birth: 03-04-1955 Attending MD: Carlin POUR. Cindie , [place], [id number] CSN: [id number] Age: 68 Admit Type: Outpatient Procedure:                Upper GI endoscopy Indications:              Heartburn, Oropharyngeal dysphagia Providers:                Carlin POUR. Cindie, DO, Devere Lodge, Leandrew Edelman RN,                            RN Referring MD:              Medicines:                See the Anesthesia note for documentation of the                            administered medications Complications:            No immediate complications. Estimated Blood Loss:     Estimated blood loss was minimal. Procedure:                Pre-Anesthesia Assessment:                           - The anesthesia plan was to use monitored                            anesthesia care (MAC).                           After obtaining informed consent, the endoscope was                            passed under direct vision. Throughout the                            procedure, the patient's blood pressure, pulse, and                            oxygen  saturations were monitored continuously. The                            [id number] ([id number]) Upper was introduced through                            the mouth, and advanced to the second part of                            duodenum. The upper GI endoscopy was accomplished                            without difficulty. The patient tolerated the                            procedure well. Scope In: 12:21:34  PM Scope Out: 12:25:44 PM Total Procedure Duration: 0 hours 4 minutes 10 seconds  Findings:      The Z-line was regular.      Biopsies were taken with a cold forceps in the middle third of the       esophagus for histology.      There is no endoscopic evidence of stenosis or stricture in the entire       esophagus.      Patchy inflammation was found in the gastric fundus  and in the gastric       body. Biopsies were taken with a cold forceps for Helicobacter pylori       testing.      The duodenal bulb, first portion of the duodenum and second portion of       the duodenum were normal. Impression:               - Z-line regular.                           - Gastritis. Biopsied.                           - Normal duodenal bulb, first portion of the                            duodenum and second portion of the duodenum.                           - Biopsies were taken with a cold forceps for                            histology in the middle third of the esophagus. Moderate Sedation:      Per Anesthesia Care Recommendation:           - Patient has a contact number available for                            emergencies. The signs and symptoms of potential                            delayed complications were discussed with the                            patient. Return to normal activities tomorrow.                            Written discharge instructions were provided to the                            patient.                           - Resume previous diet.                           - Continue present medications.                           -  Await pathology results.                           - Return to GI clinic in 3 months.                           - Use Prilosec (omeprazole ) 40 mg PO daily.                           - Consider ENT referral and/or MBSS Procedure Code(s):        --- Professional ---                           [phone], Esophagogastroduodenoscopy, flexible,                            transoral; with biopsy, single or multiple Diagnosis Code(s):        --- Professional ---                           K29.70, Gastritis, unspecified, without bleeding                           R12, Heartburn CPT copyright 2022 American Medical Association. All rights reserved. The codes documented in this report are preliminary and upon coder review may  be  revised to meet current compliance requirements. Carlin POUR. Cindie, DO Carlin POUR. Cindie, DO 11/21/2023 12:35:21 PM This report has been signed electronically. Number of Addenda: 0

## 2023-11-21 NOTE — Discharge Instructions (Addendum)
 EGD Discharge instructions Please read the instructions outlined below and refer to this sheet in the next few weeks. These discharge instructions provide you with general information on caring for yourself after you leave the hospital. Your doctor may also give you specific instructions. While your treatment has been planned according to the most current medical practices available, unavoidable complications occasionally occur. If you have any problems or questions after discharge, please call your doctor. ACTIVITY You may resume your regular activity but move at a slower pace for the next 24 hours.  Take frequent rest periods for the next 24 hours.  Walking will help expel (get rid of) the air and reduce the bloated feeling in your abdomen.  No driving for 24 hours (because of the anesthesia (medicine) used during the test).  You may shower.  Do not sign any important legal documents or operate any machinery for 24 hours (because of the anesthesia used during the test).  NUTRITION Drink plenty of fluids.  You may resume your normal diet.  Begin with a light meal and progress to your normal diet.  Avoid alcoholic beverages for 24 hours or as instructed by your caregiver.  MEDICATIONS You may resume your normal medications unless your caregiver tells you otherwise.  WHAT YOU CAN EXPECT TODAY You may experience abdominal discomfort such as a feeling of fullness or "gas" pains.  FOLLOW-UP Your doctor will discuss the results of your test with you.  SEEK IMMEDIATE MEDICAL ATTENTION IF ANY OF THE FOLLOWING OCCUR: Excessive nausea (feeling sick to your stomach) and/or vomiting.  Severe abdominal pain and distention (swelling).  Trouble swallowing.  Temperature over 101 F (37.8 C).  Rectal bleeding or vomiting of blood.   Your EGD revealed mild amount inflammation in your stomach.  I took biopsies of this to rule out infection with a bacteria called H. pylori.  Your esophagus was wide open, I  did not need to stretch it today.  I did take samples of your esophagus however.  Small bowel appeared normal.  Await pathology results, my office will contact you.  Continue on omeprazole  daily.  Follow up in GI office in 2-3 months  I hope you have a great rest of your week!  Daniel [name]. Cindie, D.O. Gastroenterology and Hepatology [facility]

## 2023-11-21 NOTE — Anesthesia Postprocedure Evaluation (Signed)
 Anesthesia Post Note  Patient: Daniel [name]  Procedure(s) Performed: EGD (ESOPHAGOGASTRODUODENOSCOPY)  Patient location during evaluation: Phase II Anesthesia Type: General Level of consciousness: awake Pain management: pain level controlled Vital Signs Assessment: post-procedure vital signs reviewed and stable Respiratory status: spontaneous breathing and respiratory function stable Cardiovascular status: blood pressure returned to baseline and stable Postop Assessment: no headache and no apparent nausea or vomiting Anesthetic complications: no Comments: Late entry   No notable events documented.   Last Vitals:  Vitals:   11/21/23 1229 11/21/23 1234  BP: 122/60   Pulse: 71 77  Resp: 19 19  Temp: 36.8 C   SpO2: 94% 97%    Last Pain:  Vitals:   11/21/23 1234  TempSrc:   PainSc: 0-No pain                 [name]

## 2023-11-24 ENCOUNTER — Encounter (HOSPITAL_COMMUNITY): Payer: Self-pay | Admitting: Internal Medicine

## 2023-11-24 ENCOUNTER — Telehealth: Payer: Self-pay | Admitting: Internal Medicine

## 2023-11-24 LAB — SURGICAL PATHOLOGY

## 2023-11-24 NOTE — Telephone Encounter (Signed)
 Patient came in office wanting to know his procedure results. Patient was very concerned.

## 2023-11-25 ENCOUNTER — Telehealth: Payer: Self-pay | Admitting: Internal Medicine

## 2023-11-25 DIAGNOSIS — K219 Gastro-esophageal reflux disease without esophagitis: Secondary | ICD-10-CM

## 2023-11-25 MED ORDER — DOXYCYCLINE MONOHYDRATE 100 MG PO TABS: 100.0000 mg | ORAL_TABLET | Freq: Two times a day (BID) | ORAL | 0 refills | Status: AC

## 2023-11-25 MED ORDER — BISMUTH 262 MG PO CHEW: 2.0000 | CHEWABLE_TABLET | Freq: Four times a day (QID) | ORAL | 0 refills | Status: AC

## 2023-11-25 MED ORDER — METRONIDAZOLE 500 MG PO TABS: 500.0000 mg | ORAL_TABLET | Freq: Three times a day (TID) | ORAL | 0 refills | Status: AC

## 2023-11-25 MED ORDER — OMEPRAZOLE 40 MG PO CPDR
40.0000 mg | DELAYED_RELEASE_CAPSULE | Freq: Two times a day (BID) | ORAL | 11 refills | Status: AC
Start: 2023-11-25 — End: 2024-11-24

## 2023-11-25 NOTE — Telephone Encounter (Signed)
 Patient has H. pylori gastritis.  I will send in 14-day course of doxycycline  100 mg twice daily, metronidazole 500 mg 3 times daily.   I will increase his omeprazole  to 40 mg twice daily.   I will also send in bismuth to take 4 times daily as well.    Follow-up as previously scheduled to discuss eradication testing.  Thank you

## 2023-11-25 NOTE — Telephone Encounter (Signed)
 Phoned the pt also and advised of the result note / medication instructions. Pt expressed understanding

## 2023-11-25 NOTE — Telephone Encounter (Signed)
See other pt call note 

## 2023-11-25 NOTE — Telephone Encounter (Signed)
 Phoned the pt and advised that I have not received his results yet but once I do I would call him. Pt advised that results take from 7 to 10 business. Will secure Dr Cindie regarding this.

## 2023-12-02 ENCOUNTER — Ambulatory Visit

## 2023-12-02 VITALS — BP 134/90 | HR 76 | Resp 20 | Ht 73.0 in | Wt 277.0 lb

## 2023-12-02 DIAGNOSIS — I7121 Aneurysm of the ascending aorta, without rupture: Secondary | ICD-10-CM

## 2023-12-02 NOTE — Patient Instructions (Signed)
 Risk Modification in those with ascending thoracic aortic aneurysm:   Continue control of blood pressure (prefer BP 130/80 or less)   2. Avoid fluoroquinolone antibiotics (I.e Ciprofloxacin, Avelox, Levofloxacin, Ofloxacin)   3.  Use of statin (to decrease cardiovascular risk)   4.  Exercise and activity limitations is individualized, but in general, contact sports are to be avoided and one should avoid heavy lifting (defined as half of ideal body weight) and exercises involving sustained Valsalva maneuver.   5.  Follow-up in 6 months with CTA chest.  OK to use a non-contrast CT if you have had a recent study for surveillance of the lung nodule.

## 2023-12-02 NOTE — Progress Notes (Signed)
 [address] [place] [address]             [phone]            Kyheem Bathgate [phone] 25-Jun-1955   History of Present Illness:  Daniel [name] is a 68 year old man with medical history of hypertension, COPD, GERD, h.pylori, prediabetes, neuropathy of both feet, hypertriglyceridemia, and former cigarette smoker who presents for initial encounter of ascending thoracic aortic aneurysm.  Aneurysm was found on lung cancer screening CT scan and measured 4.6 cm.  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  CT scan also showed pulmonary nodules and he has continued follow up with pulmonology.   He presents to the clinic today with his wife and reports that he is doing well. His blood pressure is well controlled with current medication therapy.  He recently was diagnosed with h.pylori and is undergoing antibiotic therapy. He has noticed improvement of his acid reflux after starting this.  He is active and denies heavy lifting.  He does experience shortness of breath on exertion due to COPD but this has improved with blood pressure control and inhalers.  He was a former smoker and quit in 2015. He denies chest pain and lower leg swelling.   He does have family history of ascending aortic aneurysms. He has 6 relatives with aortic aneurysms and are undergoing surveillance and one has required repair.   Current Outpatient Medications on File Prior to Visit  Medication Sig Dispense Refill   albuterol  (PROVENTIL ) (2.5 MG/3ML) 0.083% nebulizer solution Take 2.5 mg by nebulization every 6 (six) hours as needed for wheezing or shortness of breath.     alfuzosin  (UROXATRAL ) 10 MG 24 hr tablet Take 1 tablet (10 mg total) by mouth at bedtime. 30 tablet 11   ALPRAZolam (XANAX) 0.5 MG tablet Take 0.5 mg by mouth at bedtime.     Bismuth 262 MG CHEW Chew 2 tablets by mouth in the morning, at noon, in the evening, and at bedtime for 14 days. 112 tablet 0    cetirizine  (ZYRTEC ) 10 MG tablet Take 1 tablet (10 mg total) by mouth daily. 30 tablet 0   doxycycline  (ADOXA) 100 MG tablet Take 1 tablet (100 mg total) by mouth 2 (two) times daily for 14 days. 28 tablet 0   dutasteride  (AVODART ) 0.5 MG capsule Take 1 capsule (0.5 mg total) by mouth daily. 90 capsule 3   fluticasone  (FLONASE ) 50 MCG/ACT nasal spray Place 2 sprays into both nostrils daily. 16 g 0   Glucosamine-Chondroitin (OSTEO BI-FLEX REGULAR STRENGTH PO) Take 1 tablet by mouth daily.     ibuprofen  (IBU) 800 MG tablet Take 1 tablet (800 mg total) by mouth every 8 (eight) hours as needed. 30 tablet 1   linaclotide (LINZESS) 72 MCG capsule Take 1 capsule (72 mcg total) by mouth daily before breakfast.     metroNIDAZOLE (FLAGYL) 500 MG tablet Take 1 tablet (500 mg total) by mouth 3 (three) times daily for 14 days. 42 tablet 0   Multiple Vitamin (MULITIVITAMIN WITH MINERALS) TABS Take 1 tablet by mouth daily.     omeprazole  (PRILOSEC) 40 MG capsule Take 1 capsule (40 mg total) by mouth 2 (two) times daily. 60 capsule 11   STIOLTO RESPIMAT 2.5-2.5 MCG/ACT AERS 1-2 puffs each am 4 g 11   valsartan  (DIOVAN ) 160 MG tablet Take 1 tablet (160 mg total) by mouth daily. 30 tablet 11  valsartan  (DIOVAN ) 160 MG tablet Take 1 tablet (160 mg total) by mouth daily. 90 tablet 1   VENTOLIN  HFA 108 (90 Base) MCG/ACT inhaler Inhale 2 puffs into the lungs every 6 (six) hours as needed.     gabapentin  (NEURONTIN ) 300 MG capsule Take 1 capsule (300 mg total) by mouth at bedtime. (Patient not taking: Reported on 12/02/2023) 90 capsule 1   No current facility-administered medications on file prior to visit.     ROS: Review of Systems  Respiratory:  Positive for shortness of breath. Negative for cough.        On exertion  Cardiovascular:  Negative for chest pain and leg swelling.     BP (!) 134/90   Pulse 76   Resp 20   Ht 6' 1 (1.854 m)   Wt 277 lb (125.6 kg)   SpO2 95% Comment: RA  BMI 36.55 kg/m    Physical Exam Constitutional:      Appearance: Normal appearance.  HENT:     Head: Normocephalic and atraumatic.  Cardiovascular:     Rate and Rhythm: Normal rate and regular rhythm.     Heart sounds: Normal heart sounds, S1 normal and S2 normal.  Pulmonary:     Effort: Pulmonary effort is normal.     Breath sounds: Normal breath sounds.  Skin:    General: Skin is warm and dry.  Neurological:     General: No focal deficit present.     Mental Status: He is alert and oriented to person, place, and time.      Imaging: CLINICAL DATA:  Former smoker with 39 pack-year smoking history. Baseline lung cancer screening.   EXAM: CT CHEST WITHOUT CONTRAST LOW-DOSE FOR LUNG CANCER SCREENING   TECHNIQUE: Multidetector CT imaging of the chest was performed following the standard protocol without IV contrast.   RADIATION DOSE REDUCTION: This exam was performed according to the departmental dose-optimization program which includes automated exposure control, adjustment of the mA and/or kV according to patient size and/or use of iterative reconstruction technique.   COMPARISON:  CT chest dated 02/16/2003   FINDINGS: Cardiovascular: Normal heart size. No significant pericardial fluid/thickening. Aneurysmal ascending thoracic aorta measures 4.6 x 4.4 cm. Aortic atherosclerosis.   Mediastinum/Nodes: Imaged thyroid  gland without nodules meeting criteria for imaging follow-up by size. Normal esophagus. No pathologically enlarged axillary, supraclavicular, mediastinal, or hilar lymph nodes. Innumerable subcentimeter mildly hyperattenuating mediastinal and hilar lymph nodes.   Lungs/Pleura: The central airways are patent. Moderate centrilobular and paraseptal emphysema. Mild diffuse bronchial wall thickening. Bibasilar linear atelectasis/scarring. Perifissural right lower lobe, triangular pulmonary nodule measures 10.4 mm (4: 189). No pneumothorax. No pleural effusion.   Upper  abdomen: Normal.   Musculoskeletal: Nonunited medial left clavicle fracture. Multilevel degenerative changes of the thoracic spine.   IMPRESSION: 1. Lung-RADS 4A, suspicious. Follow up low-dose chest CT without contrast in 3 months (please use the following order, CT CHEST LCS NODULE FOLLOW-UP W/O CM) is recommended. Alternatively, PET may be considered when there is a solid component 8mm or larger. Perifissural right lower lobe nodules measuring up to 10.4 mm, likely perifissural lymph nodes, however given size, follow-up is recommended. 2. Aneurysmal ascending thoracic aorta measures 4.6 x 4.4 cm. Ascending thoracic aortic aneurysm. Recommend semi-annual imaging followup by CTA or MRA and referral to cardiothoracic surgery if not already obtained. This recommendation follows 2010 ACCF/AHA/AATS/ACR/ASA/SCA/SCAI/SIR/STS/SVM Guidelines for the Diagnosis and Management of Patients With Thoracic Aortic Disease. Circulation. 2010; 121: Z733-z630. Aortic aneurysm NOS (ICD10-I71.9) 3. Aortic Atherosclerosis (ICD10-I70.0)  and Emphysema (ICD10-J43.9).   These results will be called to the ordering clinician or representative by the Radiologist Assistant, and communication documented in the PACS or [employer].     Electronically Signed   By: Limin  Xu M.D.   On: 11/14/2023 13:09    A/P: Aneurysm of ascending aorta without rupture -4.6 cm x 4.4 cm ascending aortic aneurysm on CT of chest. Echocardiogram showed tricuspid aortic valve.  -We discussed the natural history and and risk factors for growth of ascending aortic aneurysms. Discussed recommendations to minimize the risk of further expansion or dissection including careful blood pressure control, avoidance of contact sports and heavy lifting, attention to lipid management.  We covered the importance of continued smoking cessation.  The patient does not yet meet surgical criteria of >5.5cm. The patient is aware of signs and  symptoms of aortic dissection and when to present to the emergency department  -Continue follow up with pulmonology for pulmonary nodules  -Follow up in 6 months with CTA of chest for continued surveillance    Risk Modification:  Statin:  not currently prescribed  Smoking cessation instruction/counseling given:  commended patient for quitting and reviewed strategies for preventing relapses  Patient was counseled on importance of Blood Pressure Control  They are instructed to contact their Primary Care Physician if they start to have blood pressure readings over 130s/90s. Do not ever stop blood pressure medications on your own, unless instructed by healthcare professional.  Please avoid use of Fluoroquinolones as this can potentially increase your risk of Aortic Rupture and/or Dissection  Patient educated on signs and symptoms of Aortic Dissection, handout also provided in AVS  Manuelita CHRISTELLA Rough, PA-C 12/02/23

## 2023-12-16 ENCOUNTER — Telehealth (INDEPENDENT_AMBULATORY_CARE_PROVIDER_SITE_OTHER): Payer: Self-pay | Admitting: *Deleted

## 2023-12-16 NOTE — Telephone Encounter (Signed)
 Voicemail on Daniel [name]'s phone  Had procedure from Dr. Cindie few weeks ago  and thinks now he has thrush.    Called patient --he his tongue is white looking, throat and roof on mouth feel fuzzy. Little hard to swallow has COPD and uses rescue enhaler. Allergic to Flagyl--uses [facility].

## 2023-12-17 NOTE — Telephone Encounter (Signed)
 Sent to Dr Cindie in secure chat. Waiting on a response. Pt had also left me another message on phone today regarding this

## 2023-12-18 ENCOUNTER — Other Ambulatory Visit: Payer: Self-pay | Admitting: Internal Medicine

## 2023-12-18 MED ORDER — NYSTATIN 100000 UNIT/ML MT SUSP: 5.0000 mL | Freq: Four times a day (QID) | OROMUCOSAL | 0 refills | Status: AC

## 2023-12-18 NOTE — Telephone Encounter (Signed)
 Already handled.

## 2023-12-18 NOTE — Telephone Encounter (Signed)
 he likely has thrush from the abx course for his h pylori. I will send in nystatin swish and swallow to his pharmacy. Thank you

## 2023-12-23 ENCOUNTER — Ambulatory Visit
Admission: EM | Admit: 2023-12-23 | Discharge: 2023-12-23 | Disposition: A | Discharge status: Home / Self Care | Attending: Family Medicine | Admitting: Family Medicine

## 2023-12-23 DIAGNOSIS — J029 Acute pharyngitis, unspecified: Secondary | ICD-10-CM | POA: Diagnosis not present

## 2023-12-23 LAB — POCT RAPID STREP A (OFFICE): Rapid Strep A Screen: NEGATIVE

## 2023-12-23 MED ORDER — FLUCONAZOLE 150 MG PO TABS
150.0000 mg | ORAL_TABLET | Freq: Every day | ORAL | 0 refills | Status: AC
Start: 2023-12-23 — End: ?

## 2023-12-23 NOTE — ED Triage Notes (Addendum)
 Pt reports he has a a sore throat with white patches x 1 day   Pt has white foamy substance on tongue

## 2023-12-23 NOTE — ED Notes (Signed)
 Pt had concerns on of the Fluconazole  interfering with his triple A. I confirmed with provider that that medication is safe to take for him. Pt verbalized understanding

## 2023-12-23 NOTE — ED Provider Notes (Signed)
 [facility]    CSN: [id number] Arrival date & time: 12/23/23  1322      History   Chief Complaint Chief Complaint  Patient presents with   Sore Throat    HPI Daniel [name] is a 68 y.o. male.   Patient presenting today with 1 day history sore throat, foamy white substance coating tongue.  Denies difficulty breathing or swallowing, fever, chills, congestion, cough, chest pain, shortness of breath.  States he does use inhalers daily for COPD that tend to irritate his mouth despite rinsing after use so has been using his nystatin  mouth rinse with minimal relief.    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   ESOPHAGOGASTRODUODENOSCOPY N/A 11/21/2023   Procedure: EGD (ESOPHAGOGASTRODUODENOSCOPY);  Surgeon: Cindie Carlin POUR, DO;  Location: [facility];  Service: Endoscopy;  Laterality: N/A;  1:15 PM, ASA 2   EYE SURGERY     HEMORRHOID SURGERY     HERNIA REPAIR     RLQ, umbilical       Home Medications    Prior to Admission medications   Medication Sig Start Date End Date Taking? Authorizing Provider  fluconazole   (DIFLUCAN ) 150 MG tablet Take 1 tablet (150 mg total) by mouth daily. 12/23/23  Yes Stuart Vernell Norris, PA-C  albuterol  (PROVENTIL ) (2.5 MG/3ML) 0.083% nebulizer solution Take 2.5 mg by nebulization every 6 (six) hours as needed for wheezing or shortness of breath. 09/02/22   [provider]  alfuzosin  (UROXATRAL ) 10 MG 24 hr tablet Take 1 tablet (10 mg total) by mouth at bedtime. 10/08/23   McKenzie, Belvie CROME, MD  ALPRAZolam (XANAX) 0.5 MG tablet Take 0.5 mg by mouth at bedtime.    [provider]  cetirizine  (ZYRTEC ) 10 MG tablet Take 1 tablet (10 mg total) by mouth daily. 06/27/21   Leath-Warren, Etta PARAS, NP  dutasteride  (AVODART ) 0.5 MG capsule Take 1 capsule (0.5 mg total) by mouth daily. 04/04/23   McKenzie, Belvie CROME, MD  fluticasone  (FLONASE ) 50 MCG/ACT nasal spray Place 2 sprays into both nostrils daily. 09/30/23   Grooms, Courtney, PA-C  gabapentin  (NEURONTIN ) 300 MG capsule Take 1 capsule (300 mg total) by mouth at bedtime. Patient not taking: Reported on 12/02/2023 10/21/23   Grooms, [name], PA-C  Glucosamine-Chondroitin (OSTEO BI-FLEX REGULAR STRENGTH PO) Take 1 tablet by mouth daily.    [provider]  ibuprofen  (IBU) 800 MG tablet Take 1 tablet (800 mg total) by mouth every 8 (eight) hours as needed. 10/31/23   Grooms, Courtney, PA-C  linaclotide  (LINZESS ) 72 MCG capsule Take 1 capsule (72 mcg total) by mouth daily before breakfast.  11/04/23   Shirlean Therisa ORN, NP  Multiple Vitamin (MULITIVITAMIN WITH MINERALS) TABS Take 1 tablet by mouth daily.    [provider]  nystatin  (MYCOSTATIN ) 100000 UNIT/ML suspension Take 5 mLs (500,000 Units total) by mouth 4 (four) times daily for 14 days. 12/18/23 01/01/24  Cindie Carlin POUR, DO  omeprazole  (PRILOSEC) 40 MG capsule Take 1 capsule (40 mg total) by mouth 2 (two) times daily. 11/25/23 11/24/24  Cindie Carlin POUR, DO  STIOLTO RESPIMAT  2.5-2.5 MCG/ACT AERS 1-2 puffs each am 11/03/23   Darlean Ozell NOVAK, MD   valsartan  (DIOVAN ) 160 MG tablet Take 1 tablet (160 mg total) by mouth daily. 11/03/23   Grooms, Courtney, PA-C  valsartan  (DIOVAN ) 160 MG tablet Take 1 tablet (160 mg total) by mouth daily. 10/31/23   Grooms, [name], PA-C  VENTOLIN  HFA 108 (90 Base) MCG/ACT inhaler Inhale 2 puffs into the lungs every 6 (six) hours as needed. 07/31/23   [provider]    Family History Family History  Problem Relation Age of Onset   Seizures Mother    Hypertension Mother    COPD Father    Colon cancer Neg Hx    Gastric cancer Neg Hx    Esophageal cancer Neg Hx    Colon polyps Neg Hx     Social History Social History   Tobacco Use   Smoking status: Former    Current packs/day: 0.00    Average packs/day: 0.3 packs/day for 40.0 years (12.0 ttl pk-yrs)    Types: Cigarettes    Start date: 01/28/1973    Quit date: 01/28/2013    Years since quitting: 10.9   Smokeless tobacco: Never  Vaping Use   Vaping status: Never Used  Substance Use Topics   Alcohol use: No   Drug use: No     Allergies   Quinolones   Review of Systems Review of Systems PER HPI  Physical Exam Triage Vital Signs ED Triage Vitals  Encounter Vitals Group     BP 12/23/23 1426 124/81     Girls Systolic BP Percentile --      Girls Diastolic BP Percentile --      Boys Systolic BP Percentile --      Boys Diastolic BP Percentile --      Pulse Rate 12/23/23 1426 73     Resp 12/23/23 1426 18     Temp 12/23/23 1426 97.6 F (36.4 C)     Temp Source 12/23/23 1426 Oral     SpO2 12/23/23 1426 91 %     Weight --      Height --      Head Circumference --      Peak Flow --      Pain Score 12/23/23 1425 7     Pain Loc --      Pain Education --      Exclude from Growth Chart --    No data found.  Updated Vital Signs BP 124/81 (BP Location: Right Arm)   Pulse 73   Temp 97.6 F (36.4 C) (Oral)   Resp 18   SpO2 91%   Visual Acuity Right Eye Distance:   Left Eye Distance:   Bilateral Distance:    Right Eye  Near:   Left Eye Near:    Bilateral Near:     Physical Exam Vitals and nursing note reviewed.  Constitutional:      Appearance: Normal appearance.  HENT:     Head: Atraumatic.     Nose:  Nose normal.     Mouth/Throat:     Mouth: Mucous membranes are moist.     Pharynx: Oropharynx is clear. Posterior oropharyngeal erythema present.     Comments: Oropharynx mildly erythematous, foamy white substance coating tongue.  Oral airway patent, uvula midline Eyes:     Extraocular Movements: Extraocular movements intact.     Conjunctiva/sclera: Conjunctivae normal.  Cardiovascular:     Rate and Rhythm: Normal rate.  Pulmonary:     Effort: Pulmonary effort is normal.  Musculoskeletal:        General: Normal range of motion.     Cervical back: Normal range of motion and neck supple.  Lymphadenopathy:     Cervical: No cervical adenopathy.  Skin:    General: Skin is warm and dry.  Neurological:     Mental Status: He is oriented to person, place, and time.  Psychiatric:        Mood and Affect: Mood normal.        Thought Content: Thought content normal.        Judgment: Judgment normal.    UC Treatments / Results  Labs (all labs ordered are listed, but only abnormal results are displayed) Labs Reviewed  POCT RAPID STREP A (OFFICE)   EKG  Radiology No results found.  Procedures Procedures (including critical care time)  Medications Ordered in UC Medications - No data to display  Initial Impression / Assessment and Plan / UC Course  I have reviewed the triage vital signs and the nursing notes.  Pertinent labs & imaging results that were available during my care of the patient were reviewed by me and considered in my medical decision making (see chart for details).     Rapid strep negative, declines throat culture today.  Suspect viral versus thrush.  Already trying nystatin  rinse which he states is not resolving the symptoms so we will trial Diflucan , supportive  over-the-counter medications and home care and continue to monitor.  Return for worsening or unresolving symptoms.  Final Clinical Impressions(s) / UC Diagnoses   Final diagnoses:  Acute pharyngitis, unspecified etiology     Discharge Instructions      Your strep test was negative today.  As we discussed, your sore throat could be from multiple different causes including thrush, a viral upper respiratory infection, seasonal allergy drainage.  Because you have a history of thrush and the nystatin  rinse is not resolving the issue, we will trial several days on Diflucan  to see if this helps with your symptoms.  Continue rinsing your mouth out with use of your inhalers and you may try supportive remedies such as warm teas, throat lozenges, over-the-counter pain relievers.  Follow-up for worsening or unresolving symptoms.    ED Prescriptions     Medication Sig Dispense Auth. Provider   fluconazole  (DIFLUCAN ) 150 MG tablet Take 1 tablet (150 mg total) by mouth daily. 3 tablet Stuart Vernell Norris, [place]      PDMP not reviewed this encounter.   Stuart Vernell Norris, [place] 12/23/23 [phone]

## 2023-12-23 NOTE — Discharge Instructions (Signed)
 Your strep test was negative today.  As we discussed, your sore throat could be from multiple different causes including thrush, a viral upper respiratory infection, seasonal allergy drainage.  Because you have a history of thrush and the nystatin  rinse is not resolving the issue, we will trial several days on Diflucan  to see if this helps with your symptoms.  Continue rinsing your mouth out with use of your inhalers and you may try supportive remedies such as warm teas, throat lozenges, over-the-counter pain relievers.  Follow-up for worsening or unresolving symptoms.

## 2024-01-13 ENCOUNTER — Encounter: Payer: Self-pay | Admitting: Internal Medicine

## 2024-02-04 ENCOUNTER — Other Ambulatory Visit: Payer: Self-pay | Admitting: Acute Care

## 2024-02-04 DIAGNOSIS — Z122 Encounter for screening for malignant neoplasm of respiratory organs: Secondary | ICD-10-CM

## 2024-02-04 DIAGNOSIS — Z87891 Personal history of nicotine dependence: Secondary | ICD-10-CM

## 2024-02-04 DIAGNOSIS — R911 Solitary pulmonary nodule: Secondary | ICD-10-CM

## 2024-02-12 ENCOUNTER — Ambulatory Visit (HOSPITAL_COMMUNITY)
Admission: RE | Admit: 2024-02-12 | Discharge: 2024-02-12 | Disposition: A | Discharge status: Home / Self Care | Source: Ambulatory Visit | Attending: Acute Care | Admitting: Acute Care

## 2024-02-12 DIAGNOSIS — Z87891 Personal history of nicotine dependence: Secondary | ICD-10-CM | POA: Diagnosis present

## 2024-02-12 DIAGNOSIS — Z122 Encounter for screening for malignant neoplasm of respiratory organs: Secondary | ICD-10-CM | POA: Insufficient documentation

## 2024-02-12 DIAGNOSIS — R911 Solitary pulmonary nodule: Secondary | ICD-10-CM | POA: Insufficient documentation

## 2024-02-18 ENCOUNTER — Ambulatory Visit: Admitting: Physician Assistant

## 2024-02-18 ENCOUNTER — Encounter: Payer: Self-pay | Admitting: Physician Assistant

## 2024-02-18 VITALS — BP 128/83 | HR 76 | Temp 98.4°F | Ht 73.0 in | Wt 278.0 lb

## 2024-02-18 DIAGNOSIS — I10 Essential (primary) hypertension: Secondary | ICD-10-CM

## 2024-02-18 DIAGNOSIS — J449 Chronic obstructive pulmonary disease, unspecified: Secondary | ICD-10-CM | POA: Diagnosis not present

## 2024-02-18 DIAGNOSIS — I7121 Aneurysm of the ascending aorta, without rupture: Secondary | ICD-10-CM

## 2024-02-18 DIAGNOSIS — R911 Solitary pulmonary nodule: Secondary | ICD-10-CM

## 2024-02-18 MED ORDER — VALSARTAN 160 MG PO TABS: 160.0000 mg | ORAL_TABLET | Freq: Every day | ORAL | 11 refills | Status: DC

## 2024-02-18 MED ORDER — VALSARTAN 160 MG PO TABS: 160.0000 mg | ORAL_TABLET | Freq: Every day | ORAL | 3 refills | Status: AC

## 2024-02-18 NOTE — Assessment & Plan Note (Signed)
 128/83 Controlled. Continue current medications. No change in management. Medication refilled for 1 year.  Discussed DASH diet and dietary sodium restrictions.  Increase dietary efforts and physical activity.

## 2024-02-18 NOTE — Progress Notes (Signed)
 "  Established Patient Office Visit  Subjective   Patient ID: Daniel [name], male    DOB: 15-Dec-1955  Age: 69 y.o. MRN: [id number]  Chief Complaint  Patient presents with   Follow-up    6 month Questions about aorta aneurysm & virtual wellness visit    Discussed the use of AI scribe software for clinical note transcription with the patient, who gave verbal consent to proceed.  History of Present Illness Daniel [name] is a 69 year old male with an aortic aneurysm and COPD who presents for general 6 month follow-up and discussion regarding his aneurysm and lung health.  He was found to have an aortic aneurysm measuring 4.5 cm, on CT scan of his lung during routine lung cancer screening. He has been seen by cardiothoracic surgery with scheduled follow up in 6 months for repeat imaging. At this time, aneurysm does not meet surgery criteria. He is focused on maintaining health and controlling his blood pressure.   During recent CT scan of his lung for lung cancer screening, a 10 mm nodule was discovered and is being monitored. He underwent repeat imaging last week and is awaiting results. He follows regularly with pulmonology and uses albuterol  and Stiolto inhalers for his breathing. He denies worsening of shortness of breath or chest pain is reported.  He has a history of H. pylori infection, for which he was treated with antibiotics. He has stopped taking ibuprofen  due to gastrointestinal side effects and is currently on a diet, having lost some weight. He has also reduced his medication intake, including stopping Tylenol . He is on Valsartan  for blood pressure management, which he reports is well-controlled.    Review of Systems  Constitutional:  Negative for activity change, appetite change, fatigue and fever.  Respiratory:  Negative for cough and shortness of breath.   Cardiovascular:  Negative for chest pain.  Neurological:  Negative for light-headedness and headaches.   Psychiatric/Behavioral:  Negative for agitation.        Objective:     BP 128/83   Pulse 76   Temp 98.4 F (36.9 C)   Ht 6' 1 (1.854 m)   Wt 278 lb (126.1 kg)   SpO2 92%   BMI 36.68 kg/m    Physical Exam Constitutional:      General: He is not in acute distress.    Appearance: Normal appearance. He is obese. He is not ill-appearing.  HENT:     Head: Normocephalic and atraumatic.     Mouth/Throat:     Mouth: Mucous membranes are moist.     Pharynx: Oropharynx is clear.  Eyes:     Extraocular Movements: Extraocular movements intact.     Conjunctiva/sclera: Conjunctivae normal.  Cardiovascular:     Rate and Rhythm: Normal rate and regular rhythm.     Heart sounds: Normal heart sounds. No murmur heard. Pulmonary:     Effort: Pulmonary effort is normal.     Breath sounds: Normal breath sounds. No wheezing, rhonchi or rales.  Skin:    General: Skin is warm and dry.  Neurological:     General: No focal deficit present.     Mental Status: He is alert and oriented to person, place, and time.  Psychiatric:        Mood and Affect: Mood normal.        Behavior: Behavior normal.     No results found for any visits on 02/18/24.  The ASCVD Risk score (Arnett DK, et al., 2019) failed  to calculate for the following reasons:   Cannot find a previous HDL lab   Cannot find a previous total cholesterol lab   * - Cholesterol units were assumed    Assessment & Plan:   Return in about 6 months (around 08/17/2024).   Essential hypertension, benign Assessment & Plan: 128/83 Controlled. Continue current medications. No change in management. Medication refilled for 1 year.  Discussed DASH diet and dietary sodium restrictions.  Increase dietary efforts and physical activity.   Orders: -     Valsartan ; Take 1 tablet (160 mg total) by mouth daily.  Dispense: 90 tablet; Refill: 3  Aneurysm of ascending aorta without rupture Assessment & Plan: Measuring 4.5 cm, currently  under surveillance with cardiothoracic surgery. Surgical intervention is not indicated unless the aneurysm reaches 5.5 cm or grows more than 0.5 cm per year. He is advised to avoid activities that could increase intra-abdominal pressure, such as heavy lifting. He is focused on modifying lifestyle and keeping a close eye on blood pressure.  - Continue surveillance with follow-up imaging in May. - Continue valsartan . Advised to stop for lab work today to monitor lipids.    Lung nodule Assessment & Plan: Pulmonary nodule identified on a previous CT scan, currently under surveillance. Recent CT scan performed, awaiting results. He is aware of the potential need for further evaluation based on nodule growth. - Await results of recent CT scan and follow up as advised.    COPD mixed type [facility]) Assessment & Plan: COPD managed with Stiolto and albuterol . No recent exacerbations reported. He is doing well with current inhaler regimen. - Continue current inhaler regimen with Stiolto and albuterol . - Follow up with pulmonologist Dr. Arla in March.   Obesity, morbid (HCC) Assessment & Plan: He has been working on weight loss and has made dietary changes, including reducing sweets and sugary drinks. He has lost weight and is engaging in physical activity such as golf. - Encouraged continued weight loss efforts and healthy lifestyle changes.     Oluwatobi Visser, PA-C "

## 2024-02-18 NOTE — Assessment & Plan Note (Signed)
 He has been working on weight loss and has made dietary changes, including reducing sweets and sugary drinks. He has lost weight and is engaging in physical activity such as golf. - Encouraged continued weight loss efforts and healthy lifestyle changes.

## 2024-02-18 NOTE — Assessment & Plan Note (Signed)
 Pulmonary nodule identified on a previous CT scan, currently under surveillance. Recent CT scan performed, awaiting results. He is aware of the potential need for further evaluation based on nodule growth. - Await results of recent CT scan and follow up as advised.

## 2024-02-18 NOTE — Assessment & Plan Note (Signed)
 Measuring 4.5 cm, currently under surveillance with cardiothoracic surgery. Surgical intervention is not indicated unless the aneurysm reaches 5.5 cm or grows more than 0.5 cm per year. He is advised to avoid activities that could increase intra-abdominal pressure, such as heavy lifting. He is focused on modifying lifestyle and keeping a close eye on blood pressure.  - Continue surveillance with follow-up imaging in May. - Continue valsartan . Advised to stop for lab work today to monitor lipids.

## 2024-02-18 NOTE — Assessment & Plan Note (Signed)
 COPD managed with Stiolto and albuterol . No recent exacerbations reported. He is doing well with current inhaler regimen. - Continue current inhaler regimen with Stiolto and albuterol . - Follow up with pulmonologist Dr. Arla in March.

## 2024-02-19 ENCOUNTER — Ambulatory Visit

## 2024-02-19 VITALS — Ht 73.0 in | Wt 278.0 lb

## 2024-02-19 DIAGNOSIS — Z0000 Encounter for general adult medical examination without abnormal findings: Secondary | ICD-10-CM

## 2024-02-19 NOTE — Progress Notes (Signed)
 "  Chief Complaint  Patient presents with   Medicare Wellness     Subjective:   Daniel [name] is a 69 y.o. male who presents for a Medicare Annual Wellness Visit.  Visit info / Clinical Intake: Medicare Wellness Visit Type:: Subsequent Annual Wellness Visit Persons participating in visit and providing information:: patient Medicare Wellness Visit Mode:: Video Since this visit was completed virtually, some vitals may be partially provided or unavailable. Missing vitals are due to the limitations of the virtual format.: Documented vitals are patient reported If Telephone or Video please confirm:: I connected with patient using audio/video enable telemedicine. I verified patient identity with two identifiers, discussed telehealth limitations, and patient agreed to proceed. Patient Location:: home Provider Location:: office Interpreter Needed?: No Pre-visit prep was completed: yes AWV questionnaire completed by patient prior to visit?: yes Date:: 02/19/24 Living arrangements:: (Patient-Rptd) lives with spouse/significant other Patient's Overall Health Status Rating: (Patient-Rptd) good Typical amount of pain: (Patient-Rptd) some Does pain affect daily life?: (Patient-Rptd) no Are you currently prescribed opioids?: no  Dietary Habits and Nutritional Risks How many meals a day?: (Patient-Rptd) 3 Eats fruit and vegetables daily?: (Patient-Rptd) yes Most meals are obtained by: (Patient-Rptd) preparing own meals In the last 2 weeks, have you had any of the following?: none Diabetic:: no  Functional Status Activities of Daily Living (to include ambulation/medication): (Patient-Rptd) Independent Ambulation: (Patient-Rptd) Independent Medication Administration: (Patient-Rptd) Independent Home Management (perform basic housework or laundry): (Patient-Rptd) Independent Manage your own finances?: (Patient-Rptd) yes Primary transportation is: (Patient-Rptd) driving Concerns about vision?: no  *vision screening is required for WTM* Concerns about hearing?: no  Fall Screening Falls in the past year?: (Patient-Rptd) 0 Number of falls in past year: 0 Was there an injury with Fall?: 0 Fall Risk Category Calculator: 0 Patient Fall Risk Level: Low Fall Risk  Fall Risk Patient at Risk for Falls Due to: No Fall Risks Fall risk Follow up: Falls evaluation completed; Education provided; Falls prevention discussed  Home and Transportation Safety: All rugs have non-skid backing?: (Patient-Rptd) N/A, no rugs All stairs or steps have railings?: (Patient-Rptd) N/A, no stairs Grab bars in the bathtub or shower?: (!) (Patient-Rptd) no Have non-skid surface in bathtub or shower?: (!) (Patient-Rptd) no Good home lighting?: (Patient-Rptd) yes Regular seat belt use?: (Patient-Rptd) yes Hospital stays in the last year:: (Patient-Rptd) no  Cognitive Assessment Difficulty concentrating, remembering, or making decisions? : (Patient-Rptd) no Will 6CIT or Mini Cog be Completed: yes What year is it?: 0 points What month is it?: 0 points Give patient an address phrase to remember (5 components): [address] [place] About what time is it?: 0 points Count backwards from 20 to 1: 0 points Say the months of the year in reverse: 0 points Repeat the address phrase from earlier: 0 points 6 CIT Score: 0 points  Advance Directives (For Healthcare) Does Patient Have a Medical Advance Directive?: No Would patient like information on creating a medical advance directive?: No - Patient declined  Reviewed/Updated  Reviewed/Updated: Reviewed All (Medical, Surgical, Family, Medications, Allergies, Care Teams, Patient Goals)    Allergies (verified) Quinolones   Current Medications (verified) Outpatient Encounter Medications as of 02/19/2024  Medication Sig   albuterol  (PROVENTIL ) (2.5 MG/3ML) 0.083% nebulizer solution Take 2.5 mg by nebulization every 6 (six) hours as needed for wheezing or  shortness of breath.   alfuzosin  (UROXATRAL ) 10 MG 24 hr tablet Take 1 tablet (10 mg total) by mouth at bedtime.   ALPRAZolam (XANAX) 0.5 MG tablet Take  0.5 mg by mouth at bedtime.   cetirizine  (ZYRTEC ) 10 MG tablet Take 1 tablet (10 mg total) by mouth daily.   dutasteride  (AVODART ) 0.5 MG capsule Take 1 capsule (0.5 mg total) by mouth daily.   fluconazole  (DIFLUCAN ) 150 MG tablet Take 1 tablet (150 mg total) by mouth daily.   fluticasone  (FLONASE ) 50 MCG/ACT nasal spray Place 2 sprays into both nostrils daily.   Glucosamine-Chondroitin (OSTEO BI-FLEX REGULAR STRENGTH PO) Take 1 tablet by mouth daily.   ibuprofen  (IBU) 800 MG tablet Take 1 tablet (800 mg total) by mouth every 8 (eight) hours as needed.   linaclotide  (LINZESS ) 72 MCG capsule Take 1 capsule (72 mcg total) by mouth daily before breakfast.   Multiple Vitamin (MULITIVITAMIN WITH MINERALS) TABS Take 1 tablet by mouth daily.   omeprazole  (PRILOSEC) 40 MG capsule Take 1 capsule (40 mg total) by mouth 2 (two) times daily.   STIOLTO RESPIMAT  2.5-2.5 MCG/ACT AERS 1-2 puffs each am   valsartan  (DIOVAN ) 160 MG tablet Take 1 tablet (160 mg total) by mouth daily.   VENTOLIN  HFA 108 (90 Base) MCG/ACT inhaler Inhale 2 puffs into the lungs every 6 (six) hours as needed.   No facility-administered encounter medications on file as of 02/19/2024.    History: Past Medical History:  Diagnosis Date   COPD (chronic obstructive pulmonary disease) (HCC)    Hypertension    Past Surgical History:  Procedure Laterality Date   APPENDECTOMY     COLONOSCOPY N/A 04/24/2017   Procedure: COLONOSCOPY;  Surgeon: Harvey Margo CROME, MD;  Location: [facility];  Service: Endoscopy;  Laterality: N/A;  1:00pm   DG 3RD DIGIT LEFT HAND     ligament torn, surgical repair of middle finger as well.   ESOPHAGOGASTRODUODENOSCOPY N/A 11/21/2023   Procedure: EGD (ESOPHAGOGASTRODUODENOSCOPY);  Surgeon: Cindie Carlin POUR, DO;  Location: [facility];  Service:  Endoscopy;  Laterality: N/A;  1:15 PM, ASA 2   EYE SURGERY     HEMORRHOID SURGERY     HERNIA REPAIR     RLQ, umbilical   Family History  Problem Relation Age of Onset   Seizures Mother    Hypertension Mother    COPD Father    Colon cancer Neg Hx    Gastric cancer Neg Hx    Esophageal cancer Neg Hx    Colon polyps Neg Hx    Social History   Occupational History   Not on file  Tobacco Use   Smoking status: Former    Current packs/day: 0.00    Average packs/day: 0.5 packs/day for 58.0 years (30.0 ttl pk-yrs)    Types: Cigarettes    Start date: 01/28/1973    Quit date: 01/28/2013    Years since quitting: 11.0   Smokeless tobacco: Never  Vaping Use   Vaping status: Never Used  Substance and Sexual Activity   Alcohol use: Not Currently   Drug use: Never   Sexual activity: Yes   Tobacco Counseling Counseling given: Yes  SDOH Screenings   Food Insecurity: No Food Insecurity (02/18/2024)  Housing: Low Risk (02/18/2024)  Transportation Needs: No Transportation Needs (02/18/2024)  Utilities: Not At Risk (02/19/2024)  Alcohol Screen: Low Risk (02/18/2024)  Depression (PHQ2-9): Low Risk (02/19/2024)  Financial Resource Strain: Low Risk (02/18/2024)  Physical Activity: Insufficiently Active (02/18/2024)  Social Connections: Socially Integrated (02/18/2024)  Stress: No Stress Concern Present (02/18/2024)  Tobacco Use: Medium Risk (02/19/2024)  Health Literacy: Adequate Health Literacy (02/19/2024)   See flowsheets for full screening  details  Depression Screen PHQ 2 & 9 Depression Scale- Over the past 2 weeks, how often have you been bothered by any of the following problems? Little interest or pleasure in doing things: 0 Feeling down, depressed, or hopeless (PHQ Adolescent also includes...irritable): 0 PHQ-2 Total Score: 0 Trouble falling or staying asleep, or sleeping too much: 0 Feeling tired or having little energy: 0 Poor appetite or overeating (PHQ Adolescent also  includes...weight loss): 0 Feeling bad about yourself - or that you are a failure or have let yourself or your family down: 0 Trouble concentrating on things, such as reading the newspaper or watching television (PHQ Adolescent also includes...like school work): 0 Moving or speaking so slowly that other people could have noticed. Or the opposite - being so fidgety or restless that you have been moving around a lot more than usual: 0 Thoughts that you would be better off dead, or of hurting yourself in some way: 0 PHQ-9 Total Score: 0 If you checked off any problems, how difficult have these problems made it for you to do your work, take care of things at home, or get along with other people?: Not difficult at all  Depression Treatment Depression Interventions/Treatment : EYV7-0 Score <4 Follow-up Not Indicated     Goals Addressed             This Visit's Progress    lose some more weight and continue playing golf again               Objective:    Today's Vitals   02/19/24 1349  Weight: 278 lb (126.1 kg)  Height: 6' 1 (1.854 m)   Body mass index is 36.68 kg/m.  Hearing/Vision screen Hearing Screening - Comments:: Patient denies any hearing difficulties.   Vision Screening - Comments:: Patient is up to date on [facility]  Immunizations and Health Maintenance Health Maintenance  Topic Date Due   Hepatitis C Screening  Never done   Zoster Vaccines- Shingrix (1 of 2) Never done   COVID-19 Vaccine (1 - 2025-26 season) Never done   Medicare Annual Wellness (AWV)  12/19/2023   Colonoscopy  04/25/2027   DTaP/Tdap/Td (2 - Td or Tdap) 01/02/2031   Pneumococcal Vaccine: 50+ Years  Completed   Influenza Vaccine  Completed   Meningococcal B Vaccine  Aged Out        Assessment/Plan:  This is a routine wellness examination for Daryll.  Patient Care Team: Grooms, [name], [place] as PCP - General (Physician Assistant) Darlean Ozell NOVAK, MD as Consulting Physician (Pulmonary Disease)  I have personally reviewed and noted the following in the patients chart:   Medical and social history Use of alcohol, tobacco or illicit drugs  Current medications and supplements including opioid prescriptions. Functional ability and status Nutritional status Physical activity Advanced directives List of other physicians Hospitalizations, surgeries, and ER visits in previous 12 months Vitals Screenings to include cognitive, depression, and falls Referrals and appointments  No orders of the defined types were placed in this encounter.  In addition, I have reviewed and discussed with patient certain preventive protocols, quality metrics, and best practice recommendations. A written personalized care plan for preventive services as well as general preventive health recommendations were provided to patient.   Aulton Routt, CMA   02/19/2024   Return February 24, 2025 at 10:40 am, for In office Medicare Well Visit w  Wellness Nurse.  After Visit Summary: (MyChart) Due to this  being a telephonic visit, the after visit summary with patients personalized plan was offered to patient via MyChart    "

## 2024-02-19 NOTE — Patient Instructions (Signed)
 Mr. Schuenemann,  Thank you for taking the time for your Medicare Wellness Visit. I appreciate your continued commitment to your health goals. Please review the care plan we discussed, and feel free to reach out if I can assist you further.  Please note that Annual Wellness Visits do not include a physical exam. Some assessments may be limited, especially if the visit was conducted virtually. If needed, we may recommend an in-person follow-up with your provider.  Ongoing Care Seeing your primary care provider every 3 to 6 months helps us  monitor your health and provide consistent, personalized care.   Referrals If a referral was made during today's visit and you haven't received any updates within two weeks, please contact the referred provider directly to check on the status.  Recommended Screenings:  Health Maintenance  Topic Date Due   Hepatitis C Screening  Never done   Zoster (Shingles) Vaccine (1 of 2) Never done   COVID-19 Vaccine (1 - 2025-26 season) Never done   Medicare Annual Wellness Visit  12/19/2023   Colon Cancer Screening  04/25/2027   DTaP/Tdap/Td vaccine (2 - Td or Tdap) 01/02/2031   Pneumococcal Vaccine for age over 66  Completed   Flu Shot  Completed   Meningitis B Vaccine  Aged Out       02/18/2024    2:27 PM  Advanced Directives  Does Patient Have a Medical Advance Directive? No  Would patient like information on creating a medical advance directive? No - Patient declined    Vision: Annual vision screenings are recommended for early detection of glaucoma, cataracts, and diabetic retinopathy. These exams can also reveal signs of chronic conditions such as diabetes and high blood pressure.  Dental: Annual dental screenings help detect early signs of oral cancer, gum disease, and other conditions linked to overall health, including heart disease and diabetes.  Please see the attached documents for additional preventive care recommendations.

## 2024-02-20 ENCOUNTER — Ambulatory Visit: Payer: Self-pay | Admitting: Physician Assistant

## 2024-02-20 LAB — CBC WITH DIFFERENTIAL/PLATELET
Basophils Absolute: 0.1 x10E3/uL (ref 0.0–0.2)
Basos: 1 %
EOS (ABSOLUTE): 0.2 x10E3/uL (ref 0.0–0.4)
Eos: 3 %
Hematocrit: 50.1 % (ref 37.5–51.0)
Hemoglobin: 16.5 g/dL (ref 13.0–17.7)
Immature Grans (Abs): 0 x10E3/uL (ref 0.0–0.1)
Immature Granulocytes: 0 %
Lymphocytes Absolute: 1.6 x10E3/uL (ref 0.7–3.1)
Lymphs: 29 %
MCH: 33.1 pg — ABNORMAL HIGH (ref 26.6–33.0)
MCHC: 32.9 g/dL (ref 31.5–35.7)
MCV: 101 fL — ABNORMAL HIGH (ref 79–97)
Monocytes Absolute: 0.4 x10E3/uL (ref 0.1–0.9)
Monocytes: 8 %
Neutrophils Absolute: 3.3 x10E3/uL (ref 1.4–7.0)
Neutrophils: 59 %
Platelets: 220 x10E3/uL (ref 150–450)
RBC: 4.98 x10E6/uL (ref 4.14–5.80)
RDW: 13.8 % (ref 11.6–15.4)
WBC: 5.6 x10E3/uL (ref 3.4–10.8)

## 2024-02-20 LAB — CMP14+EGFR
ALT: 51 IU/L — ABNORMAL HIGH (ref 0–44)
AST: 39 IU/L (ref 0–40)
Albumin: 4.7 g/dL (ref 3.9–4.9)
Alkaline Phosphatase: 41 IU/L — ABNORMAL LOW (ref 47–123)
BUN/Creatinine Ratio: 15 (ref 10–24)
BUN: 14 mg/dL (ref 8–27)
Bilirubin Total: 1.5 mg/dL — ABNORMAL HIGH (ref 0.0–1.2)
CO2: 26 mmol/L (ref 20–29)
Calcium: 9.7 mg/dL (ref 8.6–10.2)
Chloride: 99 mmol/L (ref 96–106)
Creatinine, Ser: 0.96 mg/dL (ref 0.76–1.27)
Globulin, Total: 2.4 g/dL (ref 1.5–4.5)
Glucose: 111 mg/dL — ABNORMAL HIGH (ref 70–99)
Potassium: 4.6 mmol/L (ref 3.5–5.2)
Sodium: 140 mmol/L (ref 134–144)
Total Protein: 7.1 g/dL (ref 6.0–8.5)
eGFR: 86 mL/min/1.73

## 2024-02-20 LAB — LIPID PANEL
Chol/HDL Ratio: 4.2 ratio (ref 0.0–5.0)
Cholesterol, Total: 169 mg/dL (ref 100–199)
HDL: 40 mg/dL
LDL Chol Calc (NIH): 108 mg/dL — ABNORMAL HIGH (ref 0–99)
Triglycerides: 118 mg/dL (ref 0–149)
VLDL Cholesterol Cal: 21 mg/dL (ref 5–40)

## 2024-02-20 LAB — HEMOGLOBIN A1C
Est. average glucose Bld gHb Est-mCnc: 123 mg/dL
Hgb A1c MFr Bld: 5.9 % — ABNORMAL HIGH (ref 4.8–5.6)

## 2024-02-23 ENCOUNTER — Ambulatory Visit: Admitting: Physician Assistant

## 2024-02-23 ENCOUNTER — Other Ambulatory Visit: Payer: Self-pay

## 2024-02-23 DIAGNOSIS — Z122 Encounter for screening for malignant neoplasm of respiratory organs: Secondary | ICD-10-CM

## 2024-02-23 DIAGNOSIS — Z87891 Personal history of nicotine dependence: Secondary | ICD-10-CM

## 2024-03-11 ENCOUNTER — Ambulatory Visit: Admitting: Gastroenterology

## 2024-04-22 ENCOUNTER — Ambulatory Visit: Admitting: Internal Medicine

## 2024-08-17 ENCOUNTER — Ambulatory Visit: Admitting: Physician Assistant

## 2024-10-13 ENCOUNTER — Ambulatory Visit: Admitting: Urology

## 2025-02-24 ENCOUNTER — Ambulatory Visit
